# Patient Record
Sex: Female | Born: 1975 | Race: Black or African American | Hispanic: No | Marital: Single
Health system: Midwestern US, Community
[De-identification: ages and names within clinical notes are randomized; demographics above are authoritative.]

## PROBLEM LIST (undated history)

## (undated) ENCOUNTER — Ambulatory Visit

## (undated) ENCOUNTER — Inpatient Hospital Stay (HOSPITAL_COMMUNITY): Payer: Self-pay

## (undated) DIAGNOSIS — A059 Bacterial foodborne intoxication, unspecified: Secondary | ICD-10-CM

## (undated) DIAGNOSIS — K219 Gastro-esophageal reflux disease without esophagitis: Secondary | ICD-10-CM

## (undated) DIAGNOSIS — G43909 Migraine, unspecified, not intractable, without status migrainosus: Secondary | ICD-10-CM

## (undated) DIAGNOSIS — T7840XA Allergy, unspecified, initial encounter: Secondary | ICD-10-CM

## (undated) DIAGNOSIS — F319 Bipolar disorder, unspecified: Secondary | ICD-10-CM

## (undated) DIAGNOSIS — IMO0001 Reserved for inherently not codable concepts without codable children: Secondary | ICD-10-CM

## (undated) DIAGNOSIS — F32A Depression, unspecified: Secondary | ICD-10-CM

## (undated) DIAGNOSIS — B009 Herpesviral infection, unspecified: Secondary | ICD-10-CM

## (undated) DIAGNOSIS — K759 Inflammatory liver disease, unspecified: Secondary | ICD-10-CM

## (undated) DIAGNOSIS — M1990 Unspecified osteoarthritis, unspecified site: Secondary | ICD-10-CM

## (undated) DIAGNOSIS — C801 Malignant (primary) neoplasm, unspecified: Secondary | ICD-10-CM

## (undated) DIAGNOSIS — I10 Essential (primary) hypertension: Secondary | ICD-10-CM

## (undated) DIAGNOSIS — E119 Type 2 diabetes mellitus without complications: Secondary | ICD-10-CM

## (undated) DIAGNOSIS — F329 Major depressive disorder, single episode, unspecified: Secondary | ICD-10-CM

## (undated) DIAGNOSIS — C539 Malignant neoplasm of cervix uteri, unspecified: Secondary | ICD-10-CM

## (undated) DIAGNOSIS — M797 Fibromyalgia: Secondary | ICD-10-CM

## (undated) DIAGNOSIS — M5416 Radiculopathy, lumbar region: Secondary | ICD-10-CM

## (undated) DIAGNOSIS — G4733 Obstructive sleep apnea (adult) (pediatric): Secondary | ICD-10-CM

## (undated) DIAGNOSIS — E669 Obesity, unspecified: Secondary | ICD-10-CM

## (undated) DIAGNOSIS — F419 Anxiety disorder, unspecified: Secondary | ICD-10-CM

## (undated) DIAGNOSIS — G629 Polyneuropathy, unspecified: Secondary | ICD-10-CM

## (undated) DIAGNOSIS — K760 Fatty (change of) liver, not elsewhere classified: Secondary | ICD-10-CM

## (undated) DIAGNOSIS — J45909 Unspecified asthma, uncomplicated: Secondary | ICD-10-CM

## (undated) DIAGNOSIS — D649 Anemia, unspecified: Secondary | ICD-10-CM

## (undated) DIAGNOSIS — M4800 Spinal stenosis, site unspecified: Secondary | ICD-10-CM

## (undated) DIAGNOSIS — Z9989 Dependence on other enabling machines and devices: Secondary | ICD-10-CM

## (undated) HISTORY — PX: FACET JOINT INJECTION: SHX5016

## (undated) HISTORY — PX: COLPOSCOPY: SHX161

## (undated) HISTORY — PX: CARPAL TUNNEL RELEASE: SHX101

## (undated) HISTORY — DX: Anemia, unspecified: D64.9

## (undated) HISTORY — PX: WISDOM TOOTH EXTRACTION: SHX21

## (undated) MED FILL — NITROFURANTOIN MONOHYD MAC 100 CAPS: 100 MG | 7 days supply | Qty: 14 | Fill #0 | Status: AC

---

## 1997-11-29 ENCOUNTER — Emergency Department (HOSPITAL_COMMUNITY): Admission: EM | Admit: 1997-11-29 | Discharge: 1997-11-29 | Payer: Self-pay | Admitting: Emergency Medicine

## 1998-02-28 ENCOUNTER — Ambulatory Visit (HOSPITAL_COMMUNITY): Admission: RE | Admit: 1998-02-28 | Discharge: 1998-02-28 | Payer: Self-pay | Admitting: Orthopedic Surgery

## 1998-07-14 ENCOUNTER — Emergency Department (HOSPITAL_COMMUNITY): Admission: EM | Admit: 1998-07-14 | Discharge: 1998-07-14 | Payer: Self-pay | Admitting: Emergency Medicine

## 1998-07-14 ENCOUNTER — Encounter: Payer: Self-pay | Admitting: Emergency Medicine

## 1999-02-04 ENCOUNTER — Emergency Department (HOSPITAL_COMMUNITY): Admission: EM | Admit: 1999-02-04 | Discharge: 1999-02-04 | Payer: Self-pay | Admitting: Emergency Medicine

## 1999-05-28 ENCOUNTER — Emergency Department (HOSPITAL_COMMUNITY): Admission: EM | Admit: 1999-05-28 | Discharge: 1999-05-28 | Payer: Self-pay | Admitting: Emergency Medicine

## 2000-07-29 ENCOUNTER — Inpatient Hospital Stay (HOSPITAL_COMMUNITY): Admission: AD | Admit: 2000-07-29 | Discharge: 2000-07-29 | Payer: Self-pay | Admitting: Obstetrics & Gynecology

## 2000-11-28 ENCOUNTER — Encounter: Payer: Self-pay | Admitting: Obstetrics and Gynecology

## 2000-11-28 ENCOUNTER — Ambulatory Visit (HOSPITAL_COMMUNITY): Admission: RE | Admit: 2000-11-28 | Discharge: 2000-11-28 | Payer: Self-pay | Admitting: Obstetrics and Gynecology

## 2001-08-13 ENCOUNTER — Other Ambulatory Visit: Admission: RE | Admit: 2001-08-13 | Discharge: 2001-08-13 | Payer: Self-pay | Admitting: Obstetrics and Gynecology

## 2002-05-13 ENCOUNTER — Encounter: Payer: Self-pay | Admitting: *Deleted

## 2002-05-13 ENCOUNTER — Emergency Department (HOSPITAL_COMMUNITY): Admission: EM | Admit: 2002-05-13 | Discharge: 2002-05-13 | Payer: Self-pay | Admitting: *Deleted

## 2002-05-20 ENCOUNTER — Encounter: Admission: RE | Admit: 2002-05-20 | Discharge: 2002-06-09 | Payer: Self-pay | Admitting: Orthopedic Surgery

## 2003-07-03 ENCOUNTER — Ambulatory Visit (HOSPITAL_COMMUNITY): Admission: RE | Admit: 2003-07-03 | Discharge: 2003-07-03 | Payer: Self-pay | Admitting: Internal Medicine

## 2003-07-13 ENCOUNTER — Other Ambulatory Visit: Admission: RE | Admit: 2003-07-13 | Discharge: 2003-07-13 | Payer: Self-pay | Admitting: Obstetrics and Gynecology

## 2004-03-15 ENCOUNTER — Emergency Department (HOSPITAL_COMMUNITY): Admission: EM | Admit: 2004-03-15 | Discharge: 2004-03-15 | Payer: Self-pay | Admitting: Emergency Medicine

## 2004-10-03 ENCOUNTER — Other Ambulatory Visit: Admission: RE | Admit: 2004-10-03 | Discharge: 2004-10-03 | Payer: Self-pay | Admitting: Obstetrics and Gynecology

## 2005-06-22 ENCOUNTER — Inpatient Hospital Stay (HOSPITAL_COMMUNITY): Admission: AD | Admit: 2005-06-22 | Discharge: 2005-06-22 | Payer: Self-pay | Admitting: Obstetrics and Gynecology

## 2005-06-23 ENCOUNTER — Inpatient Hospital Stay (HOSPITAL_COMMUNITY): Admission: AD | Admit: 2005-06-23 | Discharge: 2005-06-23 | Payer: Self-pay | Admitting: Obstetrics and Gynecology

## 2005-07-09 ENCOUNTER — Inpatient Hospital Stay (HOSPITAL_COMMUNITY): Admission: AD | Admit: 2005-07-09 | Discharge: 2005-07-09 | Payer: Self-pay | Admitting: Obstetrics and Gynecology

## 2005-09-03 ENCOUNTER — Emergency Department (HOSPITAL_COMMUNITY): Admission: EM | Admit: 2005-09-03 | Discharge: 2005-09-03 | Payer: Self-pay | Admitting: Emergency Medicine

## 2005-09-15 ENCOUNTER — Emergency Department (HOSPITAL_COMMUNITY): Admission: EM | Admit: 2005-09-15 | Discharge: 2005-09-15 | Payer: Self-pay | Admitting: Emergency Medicine

## 2005-10-04 ENCOUNTER — Other Ambulatory Visit: Admission: RE | Admit: 2005-10-04 | Discharge: 2005-10-04 | Payer: Self-pay | Admitting: Obstetrics and Gynecology

## 2005-10-29 ENCOUNTER — Inpatient Hospital Stay (HOSPITAL_COMMUNITY): Admission: AD | Admit: 2005-10-29 | Discharge: 2005-10-29 | Payer: Self-pay | Admitting: Obstetrics and Gynecology

## 2006-01-21 ENCOUNTER — Inpatient Hospital Stay (HOSPITAL_COMMUNITY): Admission: AD | Admit: 2006-01-21 | Discharge: 2006-01-21 | Payer: Self-pay | Admitting: Obstetrics and Gynecology

## 2006-04-16 HISTORY — PX: MANDIBLE SURGERY: SHX707

## 2006-08-08 ENCOUNTER — Ambulatory Visit (HOSPITAL_COMMUNITY): Admission: RE | Admit: 2006-08-08 | Discharge: 2006-08-10 | Payer: Self-pay | Admitting: Neurosurgery

## 2006-12-08 ENCOUNTER — Emergency Department (HOSPITAL_COMMUNITY): Admission: EM | Admit: 2006-12-08 | Discharge: 2006-12-08 | Payer: Self-pay | Admitting: *Deleted

## 2007-04-19 ENCOUNTER — Emergency Department (HOSPITAL_COMMUNITY): Admission: EM | Admit: 2007-04-19 | Discharge: 2007-04-19 | Payer: Self-pay | Admitting: Emergency Medicine

## 2008-01-12 ENCOUNTER — Ambulatory Visit: Payer: Self-pay | Admitting: Hematology and Oncology

## 2008-01-22 LAB — URINALYSIS, MICROSCOPIC - CHCC
Bilirubin (Urine): NEGATIVE
Glucose: NEGATIVE g/dL
Ketones: NEGATIVE mg/dL
RBC count: NEGATIVE (ref 0–2)

## 2008-01-22 LAB — CBC & DIFF AND RETIC
BASO%: 0.9 % (ref 0.0–2.0)
Basophils Absolute: 0.1 10e3/uL (ref 0.0–0.1)
EOS%: 2.4 % (ref 0.0–7.0)
Eosinophils Absolute: 0.2 10e3/uL (ref 0.0–0.5)
HCT: 34.4 % — ABNORMAL LOW (ref 34.8–46.6)
HGB: 10.6 g/dL — ABNORMAL LOW (ref 11.6–15.9)
IRF: 0.31 (ref 0.130–0.330)
LYMPH%: 24.3 % (ref 14.0–48.0)
MCH: 19.6 pg — ABNORMAL LOW (ref 26.0–34.0)
MCHC: 30.9 g/dL — ABNORMAL LOW (ref 32.0–36.0)
MCV: 63.6 fL — ABNORMAL LOW (ref 81.0–101.0)
MONO#: 0.5 10e3/uL (ref 0.1–0.9)
MONO%: 5.7 % (ref 0.0–13.0)
NEUT#: 6 10e3/uL (ref 1.5–6.5)
NEUT%: 66.8 % (ref 39.6–76.8)
Platelets: 438 10e3/uL — ABNORMAL HIGH (ref 145–400)
RBC: 5.41 10e6/uL — ABNORMAL HIGH (ref 3.70–5.32)
RDW: 16.8 % — ABNORMAL HIGH (ref 11.3–14.5)
RETIC #: 77.9 10e3/uL (ref 19.7–115.1)
Retic %: 1.4 % (ref 0.4–2.3)
WBC: 9 10e3/uL (ref 3.9–10.0)
lymph#: 2.2 10e3/uL (ref 0.9–3.3)

## 2008-01-23 LAB — RHEUMATOID FACTOR: Rhuematoid fact SerPl-aCnc: <20 IU/mL (ref 0–20)

## 2008-01-23 LAB — ANA: Anti Nuclear Antibody(ANA): NEGATIVE

## 2008-01-26 LAB — PROTEIN ELECTROPHORESIS, SERUM, WITH REFLEX
Alpha-2-Globulin: 11.4 % (ref 7.1–11.8)
Total Protein, Serum Electrophoresis: 7.5 g/dL (ref 6.0–8.3)

## 2008-01-26 LAB — COMPREHENSIVE METABOLIC PANEL
ALT: 31 U/L (ref 0–35)
CO2: 24 mEq/L (ref 19–32)
Creatinine, Ser: 0.68 mg/dL (ref 0.40–1.20)
Glucose, Bld: 91 mg/dL (ref 70–99)
Total Bilirubin: 0.2 mg/dL — ABNORMAL LOW (ref 0.3–1.2)

## 2008-01-26 LAB — HEMOGLOBINOPATHY EVALUATION
Hgb F Quant: 0 % (ref 0.0–2.0)
Hgb S Quant: 0 % (ref 0.0–0.0)

## 2008-01-26 LAB — IRON AND TIBC
%SAT: 3 % — ABNORMAL LOW (ref 20–55)
Iron: 12 ug/dL — ABNORMAL LOW (ref 42–145)
TIBC: 368 ug/dL (ref 250–470)

## 2008-01-26 LAB — LACTATE DEHYDROGENASE: LDH: 150 U/L (ref 94–250)

## 2008-01-26 LAB — TSH: TSH: 1.308 u[IU]/mL (ref 0.350–4.500)

## 2008-01-30 LAB — FECAL OCCULT BLOOD, GUAIAC: Occult Blood: NEGATIVE

## 2008-02-13 LAB — CBC WITH DIFFERENTIAL/PLATELET
BASO%: 0.5 % (ref 0.0–2.0)
Eosinophils Absolute: 0.2 10*3/uL (ref 0.0–0.5)
LYMPH%: 22.1 % (ref 14.0–48.0)
MONO#: 0.5 10*3/uL (ref 0.1–0.9)
NEUT#: 6.4 10*3/uL (ref 1.5–6.5)
Platelets: 390 10*3/uL (ref 145–400)
RBC: 5.27 10*6/uL (ref 3.70–5.32)
RDW: 18.7 % — ABNORMAL HIGH (ref 11.3–14.5)
WBC: 9.2 10*3/uL (ref 3.9–10.0)
lymph#: 2 10*3/uL (ref 0.9–3.3)

## 2008-02-28 ENCOUNTER — Emergency Department (HOSPITAL_COMMUNITY): Admission: EM | Admit: 2008-02-28 | Discharge: 2008-02-28 | Payer: Self-pay | Admitting: Emergency Medicine

## 2008-03-10 ENCOUNTER — Ambulatory Visit: Payer: Self-pay | Admitting: Hematology and Oncology

## 2008-03-16 LAB — CBC WITH DIFFERENTIAL/PLATELET
Eosinophils Absolute: 0.2 10*3/uL (ref 0.0–0.5)
HCT: 37.5 % (ref 34.8–46.6)
LYMPH%: 15 % (ref 14.0–48.0)
MCV: 70 fL — ABNORMAL LOW (ref 81.0–101.0)
MONO#: 0.7 10*3/uL (ref 0.1–0.9)
NEUT#: 8.6 10*3/uL — ABNORMAL HIGH (ref 1.5–6.5)
NEUT%: 76.2 % (ref 39.6–76.8)
Platelets: 341 10*3/uL (ref 145–400)
WBC: 11.3 10*3/uL — ABNORMAL HIGH (ref 3.9–10.0)

## 2008-03-16 LAB — FERRITIN: Ferritin: 295 ng/mL — ABNORMAL HIGH (ref 10–291)

## 2008-03-16 LAB — IRON AND TIBC: %SAT: 14 % — ABNORMAL LOW (ref 20–55)

## 2008-06-05 ENCOUNTER — Inpatient Hospital Stay (HOSPITAL_COMMUNITY): Admission: AD | Admit: 2008-06-05 | Discharge: 2008-06-05 | Payer: Self-pay | Admitting: Obstetrics & Gynecology

## 2008-06-18 ENCOUNTER — Ambulatory Visit: Payer: Self-pay | Admitting: Hematology and Oncology

## 2008-06-22 ENCOUNTER — Encounter: Admission: RE | Admit: 2008-06-22 | Discharge: 2008-06-22 | Payer: Self-pay | Admitting: Family Medicine

## 2008-06-22 LAB — COMPREHENSIVE METABOLIC PANEL
AST: 18 U/L (ref 0–37)
Albumin: 4.4 g/dL (ref 3.5–5.2)
Alkaline Phosphatase: 76 U/L (ref 39–117)
Calcium: 9.3 mg/dL (ref 8.4–10.5)
Chloride: 104 mEq/L (ref 96–112)
Glucose, Bld: 103 mg/dL — ABNORMAL HIGH (ref 70–99)
Potassium: 3.7 mEq/L (ref 3.5–5.3)
Sodium: 140 mEq/L (ref 135–145)
Total Protein: 7.4 g/dL (ref 6.0–8.3)

## 2008-06-22 LAB — CBC WITH DIFFERENTIAL/PLATELET
Basophils Absolute: 0 10*3/uL (ref 0.0–0.1)
Eosinophils Absolute: 0.2 10*3/uL (ref 0.0–0.5)
HGB: 12.4 g/dL (ref 11.6–15.9)
MCV: 76.9 fL — ABNORMAL LOW (ref 79.5–101.0)
MONO#: 0.4 10*3/uL (ref 0.1–0.9)
MONO%: 3.8 % (ref 0.0–14.0)
NEUT#: 7.6 10*3/uL — ABNORMAL HIGH (ref 1.5–6.5)
RBC: 4.97 10*6/uL (ref 3.70–5.45)
RDW: 15.8 % — ABNORMAL HIGH (ref 11.2–14.5)
WBC: 9.7 10*3/uL (ref 3.9–10.3)
lymph#: 1.6 10*3/uL (ref 0.9–3.3)

## 2008-06-22 LAB — IRON AND TIBC: UIBC: 318 ug/dL

## 2008-08-11 ENCOUNTER — Emergency Department (HOSPITAL_COMMUNITY): Admission: EM | Admit: 2008-08-11 | Discharge: 2008-08-11 | Payer: Self-pay | Admitting: Emergency Medicine

## 2008-09-28 ENCOUNTER — Ambulatory Visit: Payer: Self-pay | Admitting: Hematology and Oncology

## 2008-09-30 LAB — IRON AND TIBC
%SAT: 6 % — ABNORMAL LOW (ref 20–55)
TIBC: 331 ug/dL (ref 250–470)

## 2008-09-30 LAB — COMPREHENSIVE METABOLIC PANEL
ALT: 28 U/L (ref 0–35)
AST: 19 U/L (ref 0–37)
Alkaline Phosphatase: 79 U/L (ref 39–117)
BUN: 10 mg/dL (ref 6–23)
Chloride: 105 mEq/L (ref 96–112)
Creatinine, Ser: 0.87 mg/dL (ref 0.40–1.20)
Total Bilirubin: 0.2 mg/dL — ABNORMAL LOW (ref 0.3–1.2)

## 2008-09-30 LAB — CBC WITH DIFFERENTIAL/PLATELET
BASO%: 0.1 % (ref 0.0–2.0)
Basophils Absolute: 0 10*3/uL (ref 0.0–0.1)
EOS%: 1.8 % (ref 0.0–7.0)
HCT: 35.3 % (ref 34.8–46.6)
HGB: 12.1 g/dL (ref 11.6–15.9)
MCH: 25.5 pg (ref 25.1–34.0)
MCHC: 34.3 g/dL (ref 31.5–36.0)
MCV: 74.3 fL — ABNORMAL LOW (ref 79.5–101.0)
MONO%: 4.3 % (ref 0.0–14.0)
NEUT%: 71.4 % (ref 38.4–76.8)
lymph#: 2.3 10*3/uL (ref 0.9–3.3)

## 2008-09-30 LAB — FERRITIN: Ferritin: 145 ng/mL (ref 10–291)

## 2008-11-14 ENCOUNTER — Emergency Department (HOSPITAL_COMMUNITY): Admission: EM | Admit: 2008-11-14 | Discharge: 2008-11-15 | Payer: Self-pay | Admitting: Emergency Medicine

## 2009-03-18 ENCOUNTER — Emergency Department (HOSPITAL_COMMUNITY): Admission: EM | Admit: 2009-03-18 | Discharge: 2009-03-18 | Payer: Self-pay | Admitting: Emergency Medicine

## 2009-04-14 ENCOUNTER — Inpatient Hospital Stay (HOSPITAL_COMMUNITY): Admission: AD | Admit: 2009-04-14 | Discharge: 2009-04-14 | Payer: Self-pay | Admitting: Obstetrics and Gynecology

## 2009-05-18 ENCOUNTER — Ambulatory Visit: Payer: Self-pay | Admitting: Hematology and Oncology

## 2009-05-18 LAB — CBC WITH DIFFERENTIAL/PLATELET
BASO%: 0.3 % (ref 0.0–2.0)
Basophils Absolute: 0 10*3/uL (ref 0.0–0.1)
EOS%: 1.2 % (ref 0.0–7.0)
Eosinophils Absolute: 0.1 10*3/uL (ref 0.0–0.5)
HCT: 34.8 % (ref 34.8–46.6)
HGB: 10.6 g/dL — ABNORMAL LOW (ref 11.6–15.9)
LYMPH%: 22.5 % (ref 14.0–49.7)
MCH: 20.6 pg — ABNORMAL LOW (ref 25.1–34.0)
MCHC: 30.5 g/dL — ABNORMAL LOW (ref 31.5–36.0)
MCV: 67.6 fL — ABNORMAL LOW (ref 79.5–101.0)
MONO#: 0.6 10*3/uL (ref 0.1–0.9)
MONO%: 5.1 % (ref 0.0–14.0)
NEUT#: 7.9 10*3/uL — ABNORMAL HIGH (ref 1.5–6.5)
NEUT%: 70.9 % (ref 38.4–76.8)
Platelets: 521 10*3/uL — ABNORMAL HIGH (ref 145–400)
RBC: 5.15 10*6/uL (ref 3.70–5.45)
RDW: 16.4 % — ABNORMAL HIGH (ref 11.2–14.5)
WBC: 11.2 10*3/uL — ABNORMAL HIGH (ref 3.9–10.3)
lymph#: 2.5 10*3/uL (ref 0.9–3.3)

## 2009-05-20 LAB — COMPREHENSIVE METABOLIC PANEL
ALT: 23 U/L (ref 0–35)
AST: 22 U/L (ref 0–37)
Albumin: 4.6 g/dL (ref 3.5–5.2)
Alkaline Phosphatase: 82 U/L (ref 39–117)
BUN: 12 mg/dL (ref 6–23)
CO2: 23 mEq/L (ref 19–32)
Calcium: 9.9 mg/dL (ref 8.4–10.5)
Chloride: 101 mEq/L (ref 96–112)
Creatinine, Ser: 0.72 mg/dL (ref 0.40–1.20)
Glucose, Bld: 92 mg/dL (ref 70–99)
Potassium: 3.8 mEq/L (ref 3.5–5.3)
Sodium: 138 mEq/L (ref 135–145)
Total Bilirubin: 0.3 mg/dL (ref 0.3–1.2)
Total Protein: 7.9 g/dL (ref 6.0–8.3)

## 2009-05-20 LAB — FOLATE RBC: RBC Folate: 573 ng/mL (ref 180–600)

## 2009-05-20 LAB — PROTEIN ELECTROPHORESIS, SERUM
Albumin ELP: 54.8 % — ABNORMAL LOW (ref 55.8–66.1)
Alpha-1-Globulin: 4.1 % (ref 2.9–4.9)
Alpha-2-Globulin: 11.6 % (ref 7.1–11.8)
Beta 2: 5.5 % (ref 3.2–6.5)
Beta Globulin: 6 % (ref 4.7–7.2)
Gamma Globulin: 18 % (ref 11.1–18.8)
Total Protein, Serum Electrophoresis: 7.9 g/dL (ref 6.0–8.3)

## 2009-05-20 LAB — IRON AND TIBC
%SAT: 3 % — ABNORMAL LOW (ref 20–55)
Iron: 11 ug/dL — ABNORMAL LOW (ref 42–145)
TIBC: 389 ug/dL (ref 250–470)
UIBC: 378 ug/dL

## 2009-05-20 LAB — FERRITIN: Ferritin: 55 ng/mL (ref 10–291)

## 2009-07-25 ENCOUNTER — Ambulatory Visit: Payer: Self-pay | Admitting: Hematology and Oncology

## 2009-07-25 LAB — CBC WITH DIFFERENTIAL/PLATELET
BASO%: 0.3 % (ref 0.0–2.0)
Basophils Absolute: 0 10*3/uL (ref 0.0–0.1)
EOS%: 1.6 % (ref 0.0–7.0)
Eosinophils Absolute: 0.2 10*3/uL (ref 0.0–0.5)
HCT: 37.9 % (ref 34.8–46.6)
HGB: 11.9 g/dL (ref 11.6–15.9)
LYMPH%: 21.6 % (ref 14.0–49.7)
MCH: 24 pg — ABNORMAL LOW (ref 25.1–34.0)
MCHC: 31.4 g/dL — ABNORMAL LOW (ref 31.5–36.0)
MCV: 76.6 fL — ABNORMAL LOW (ref 79.5–101.0)
MONO#: 0.5 10*3/uL (ref 0.1–0.9)
MONO%: 4.6 % (ref 0.0–14.0)
NEUT#: 8.2 10*3/uL — ABNORMAL HIGH (ref 1.5–6.5)
NEUT%: 71.9 % (ref 38.4–76.8)
Platelets: 360 10*3/uL (ref 145–400)
RBC: 4.95 10*6/uL (ref 3.70–5.45)
RDW: 22.8 % — ABNORMAL HIGH (ref 11.2–14.5)
WBC: 11.4 10*3/uL — ABNORMAL HIGH (ref 3.9–10.3)
lymph#: 2.5 10*3/uL (ref 0.9–3.3)
nRBC: 0 % (ref 0–0)

## 2009-07-25 LAB — BASIC METABOLIC PANEL
BUN: 7 mg/dL (ref 6–23)
CO2: 23 mEq/L (ref 19–32)
Calcium: 9.6 mg/dL (ref 8.4–10.5)
Chloride: 104 mEq/L (ref 96–112)
Creatinine, Ser: 0.77 mg/dL (ref 0.40–1.20)
Glucose, Bld: 86 mg/dL (ref 70–99)
Potassium: 4.2 mEq/L (ref 3.5–5.3)
Sodium: 140 mEq/L (ref 135–145)

## 2009-07-25 LAB — IRON AND TIBC
%SAT: 7 % — ABNORMAL LOW (ref 20–55)
Iron: 23 ug/dL — ABNORMAL LOW (ref 42–145)
TIBC: 309 ug/dL (ref 250–470)
UIBC: 286 ug/dL

## 2009-07-25 LAB — FERRITIN: Ferritin: 281 ng/mL (ref 10–291)

## 2009-07-26 DIAGNOSIS — M797 Fibromyalgia: Secondary | ICD-10-CM | POA: Insufficient documentation

## 2009-07-26 DIAGNOSIS — G9332 Myalgic encephalomyelitis/chronic fatigue syndrome: Secondary | ICD-10-CM | POA: Insufficient documentation

## 2009-07-28 ENCOUNTER — Ambulatory Visit (HOSPITAL_COMMUNITY): Admission: RE | Admit: 2009-07-28 | Discharge: 2009-07-28 | Payer: Self-pay | Admitting: Neurological Surgery

## 2009-09-15 ENCOUNTER — Emergency Department (HOSPITAL_COMMUNITY): Admission: EM | Admit: 2009-09-15 | Discharge: 2009-09-15 | Payer: Self-pay | Admitting: Emergency Medicine

## 2009-09-19 ENCOUNTER — Ambulatory Visit (HOSPITAL_BASED_OUTPATIENT_CLINIC_OR_DEPARTMENT_OTHER): Admission: RE | Admit: 2009-09-19 | Discharge: 2009-09-19 | Payer: Self-pay | Admitting: Family Medicine

## 2009-09-24 ENCOUNTER — Ambulatory Visit: Payer: Self-pay | Admitting: Internal Medicine

## 2009-11-22 ENCOUNTER — Ambulatory Visit: Payer: Self-pay | Admitting: Hematology and Oncology

## 2009-11-22 LAB — CBC WITH DIFFERENTIAL/PLATELET
BASO%: 0.4 % (ref 0.0–2.0)
Basophils Absolute: 0.1 10*3/uL (ref 0.0–0.1)
EOS%: 0.8 % (ref 0.0–7.0)
Eosinophils Absolute: 0.1 10*3/uL (ref 0.0–0.5)
HCT: 36.4 % (ref 34.8–46.6)
HGB: 11.9 g/dL (ref 11.6–15.9)
LYMPH%: 12.7 % — ABNORMAL LOW (ref 14.0–49.7)
MCH: 25.8 pg (ref 25.1–34.0)
MCHC: 32.6 g/dL (ref 31.5–36.0)
MCV: 79.1 fL — ABNORMAL LOW (ref 79.5–101.0)
MONO#: 0.5 10*3/uL (ref 0.1–0.9)
MONO%: 3.3 % (ref 0.0–14.0)
NEUT#: 12.5 10*3/uL — ABNORMAL HIGH (ref 1.5–6.5)
NEUT%: 82.8 % — ABNORMAL HIGH (ref 38.4–76.8)
Platelets: 429 10*3/uL — ABNORMAL HIGH (ref 145–400)
RBC: 4.61 10*6/uL (ref 3.70–5.45)
RDW: 16.8 % — ABNORMAL HIGH (ref 11.2–14.5)
WBC: 15.1 10*3/uL — ABNORMAL HIGH (ref 3.9–10.3)
lymph#: 1.9 10*3/uL (ref 0.9–3.3)

## 2009-11-22 LAB — IRON AND TIBC
%SAT: 4 % — ABNORMAL LOW (ref 20–55)
Iron: 16 ug/dL — ABNORMAL LOW (ref 42–145)
TIBC: 364 ug/dL (ref 250–470)
UIBC: 348 ug/dL

## 2009-11-22 LAB — BASIC METABOLIC PANEL
BUN: 8 mg/dL (ref 6–23)
CO2: 22 mEq/L (ref 19–32)
Calcium: 9.6 mg/dL (ref 8.4–10.5)
Chloride: 102 mEq/L (ref 96–112)
Creatinine, Ser: 0.71 mg/dL (ref 0.40–1.20)
Glucose, Bld: 104 mg/dL — ABNORMAL HIGH (ref 70–99)
Potassium: 4.2 mEq/L (ref 3.5–5.3)
Sodium: 139 mEq/L (ref 135–145)

## 2009-11-22 LAB — FERRITIN: Ferritin: 163 ng/mL (ref 10–291)

## 2009-12-01 ENCOUNTER — Encounter: Admission: RE | Admit: 2009-12-01 | Discharge: 2009-12-01 | Payer: Self-pay | Admitting: Family Medicine

## 2010-02-02 ENCOUNTER — Emergency Department (HOSPITAL_COMMUNITY): Admission: EM | Admit: 2010-02-02 | Discharge: 2010-02-02 | Payer: Self-pay | Admitting: Emergency Medicine

## 2010-04-03 ENCOUNTER — Encounter
Admission: RE | Admit: 2010-04-03 | Discharge: 2010-04-12 | Payer: Self-pay | Source: Home / Self Care | Attending: Neurology | Admitting: Neurology

## 2010-04-12 ENCOUNTER — Ambulatory Visit: Payer: Self-pay | Admitting: Hematology and Oncology

## 2010-04-12 ENCOUNTER — Encounter
Admission: RE | Admit: 2010-04-12 | Discharge: 2010-04-21 | Payer: Self-pay | Source: Home / Self Care | Attending: Neurology | Admitting: Neurology

## 2010-04-16 ENCOUNTER — Inpatient Hospital Stay (HOSPITAL_COMMUNITY)
Admission: AD | Admit: 2010-04-16 | Discharge: 2010-04-16 | Disposition: A | Discharge status: Still In Hospital | Payer: Self-pay | Source: Home / Self Care | Attending: Obstetrics and Gynecology | Admitting: Obstetrics and Gynecology

## 2010-04-16 ENCOUNTER — Inpatient Hospital Stay (HOSPITAL_COMMUNITY)
Admission: AD | Admit: 2010-04-16 | Discharge: 2010-04-16 | Payer: Self-pay | Source: Home / Self Care | Attending: Obstetrics and Gynecology | Admitting: Obstetrics and Gynecology

## 2010-04-19 ENCOUNTER — Encounter: Admit: 2010-04-19 | Payer: Self-pay | Admitting: Neurology

## 2010-04-20 LAB — COMPREHENSIVE METABOLIC PANEL
ALT: 21 U/L (ref 0–35)
AST: 26 U/L (ref 0–37)
Albumin: 4.6 g/dL (ref 3.5–5.2)
Alkaline Phosphatase: 76 U/L (ref 39–117)
BUN: 9 mg/dL (ref 6–23)
CO2: 24 mEq/L (ref 19–32)
Calcium: 9 mg/dL (ref 8.4–10.5)
Chloride: 101 mEq/L (ref 96–112)
Creatinine, Ser: 0.7 mg/dL (ref 0.40–1.20)
Glucose, Bld: 124 mg/dL — ABNORMAL HIGH (ref 70–99)
Potassium: 3.7 mEq/L (ref 3.5–5.3)
Sodium: 137 mEq/L (ref 135–145)
Total Bilirubin: 0.3 mg/dL (ref 0.3–1.2)
Total Protein: 7.7 g/dL (ref 6.0–8.3)

## 2010-04-20 LAB — CBC WITH DIFFERENTIAL/PLATELET
BASO%: 0.3 % (ref 0.0–2.0)
Basophils Absolute: 0 10*3/uL (ref 0.0–0.1)
EOS%: 2.1 % (ref 0.0–7.0)
Eosinophils Absolute: 0.2 10*3/uL (ref 0.0–0.5)
HCT: 35.3 % (ref 34.8–46.6)
HGB: 11.6 g/dL (ref 11.6–15.9)
LYMPH%: 17.8 % (ref 14.0–49.7)
MCH: 24.1 pg — ABNORMAL LOW (ref 25.1–34.0)
MCHC: 32.9 g/dL (ref 31.5–36.0)
MCV: 73.4 fL — ABNORMAL LOW (ref 79.5–101.0)
MONO#: 0.4 10*3/uL (ref 0.1–0.9)
MONO%: 4.1 % (ref 0.0–14.0)
NEUT#: 7.7 10*3/uL — ABNORMAL HIGH (ref 1.5–6.5)
NEUT%: 75.7 % (ref 38.4–76.8)
Platelets: 406 10*3/uL — ABNORMAL HIGH (ref 145–400)
RBC: 4.81 10*6/uL (ref 3.70–5.45)
RDW: 17.6 % — ABNORMAL HIGH (ref 11.2–14.5)
WBC: 10.1 10*3/uL (ref 3.9–10.3)
lymph#: 1.8 10*3/uL (ref 0.9–3.3)

## 2010-04-20 LAB — IRON AND TIBC
%SAT: 5 % — ABNORMAL LOW (ref 20–55)
Iron: 15 ug/dL — ABNORMAL LOW (ref 42–145)
TIBC: 332 ug/dL (ref 250–470)
UIBC: 317 ug/dL

## 2010-04-20 LAB — FERRITIN: Ferritin: 183 ng/mL (ref 10–291)

## 2010-04-21 DIAGNOSIS — Z87898 Personal history of other specified conditions: Secondary | ICD-10-CM | POA: Insufficient documentation

## 2010-05-07 ENCOUNTER — Encounter: Payer: Self-pay | Admitting: Family Medicine

## 2010-05-27 ENCOUNTER — Emergency Department (HOSPITAL_COMMUNITY)
Admission: EM | Admit: 2010-05-27 | Discharge: 2010-05-27 | Disposition: A | Discharge status: Home / Self Care | Payer: Medicaid Other | Attending: Emergency Medicine | Admitting: Emergency Medicine

## 2010-05-27 DIAGNOSIS — IMO0001 Reserved for inherently not codable concepts without codable children: Secondary | ICD-10-CM | POA: Insufficient documentation

## 2010-05-27 DIAGNOSIS — J45909 Unspecified asthma, uncomplicated: Secondary | ICD-10-CM | POA: Insufficient documentation

## 2010-05-27 DIAGNOSIS — R300 Dysuria: Secondary | ICD-10-CM | POA: Insufficient documentation

## 2010-05-27 LAB — BASIC METABOLIC PANEL
BUN: 9 mg/dL (ref 6–23)
CO2: 26 mEq/L (ref 19–32)
Calcium: 9.7 mg/dL (ref 8.4–10.5)
Chloride: 105 mEq/L (ref 96–112)
Creatinine, Ser: 0.66 mg/dL (ref 0.4–1.2)
GFR calc Af Amer: >60 mL/min (ref >60)
GFR calc non Af Amer: >60 mL/min (ref >60)
Glucose, Bld: 115 mg/dL — ABNORMAL HIGH (ref 70–99)
Potassium: 3.5 mEq/L (ref 3.5–5.1)
Sodium: 140 mEq/L (ref 135–145)

## 2010-05-27 LAB — URINALYSIS, ROUTINE W REFLEX MICROSCOPIC
Bilirubin Urine: NEGATIVE
Hgb urine dipstick: NEGATIVE
Ketones, ur: NEGATIVE mg/dL
Nitrite: NEGATIVE
Protein, ur: NEGATIVE mg/dL
Specific Gravity, Urine: 1.018 (ref 1.005–1.030)
Urine Glucose, Fasting: NEGATIVE mg/dL
Urobilinogen, UA: 0.2 mg/dL (ref 0.0–1.0)
pH: 6.5 (ref 5.0–8.0)

## 2010-05-27 LAB — DIFFERENTIAL
Basophils Absolute: 0 10*3/uL (ref 0.0–0.1)
Basophils Relative: 0 % (ref 0–1)
Eosinophils Absolute: 0.3 10*3/uL (ref 0.0–0.7)
Eosinophils Relative: 3 % (ref 0–5)
Lymphocytes Relative: 24 % (ref 12–46)
Lymphs Abs: 2.5 10*3/uL (ref 0.7–4.0)
Monocytes Absolute: 0.6 10*3/uL (ref 0.1–1.0)
Monocytes Relative: 6 % (ref 3–12)
Neutro Abs: 7.1 10*3/uL (ref 1.7–7.7)
Neutrophils Relative %: 67 % (ref 43–77)

## 2010-05-27 LAB — CBC
HCT: 35.3 % — ABNORMAL LOW (ref 36.0–46.0)
Hemoglobin: 11.4 g/dL — ABNORMAL LOW (ref 12.0–15.0)
MCH: 23.6 pg — ABNORMAL LOW (ref 26.0–34.0)
MCHC: 32.3 g/dL (ref 30.0–36.0)
MCV: 73.1 fL — ABNORMAL LOW (ref 78.0–100.0)
Platelets: 369 10*3/uL (ref 150–400)
RBC: 4.83 MIL/uL (ref 3.87–5.11)
RDW: 16.5 % — ABNORMAL HIGH (ref 11.5–15.5)
WBC: 10.6 10*3/uL — ABNORMAL HIGH (ref 4.0–10.5)

## 2010-05-27 LAB — PREGNANCY, URINE: Preg Test, Ur: NEGATIVE

## 2010-05-29 LAB — URINE CULTURE
Colony Count: 6000
Culture  Setup Time: 201202120153

## 2010-06-06 ENCOUNTER — Emergency Department (HOSPITAL_BASED_OUTPATIENT_CLINIC_OR_DEPARTMENT_OTHER)
Admission: EM | Admit: 2010-06-06 | Discharge: 2010-06-06 | Disposition: A | Discharge status: Home / Self Care | Payer: Medicaid Other | Attending: Emergency Medicine | Admitting: Emergency Medicine

## 2010-06-06 DIAGNOSIS — Z79899 Other long term (current) drug therapy: Secondary | ICD-10-CM | POA: Insufficient documentation

## 2010-06-06 DIAGNOSIS — J329 Chronic sinusitis, unspecified: Secondary | ICD-10-CM | POA: Insufficient documentation

## 2010-06-06 DIAGNOSIS — IMO0001 Reserved for inherently not codable concepts without codable children: Secondary | ICD-10-CM | POA: Insufficient documentation

## 2010-06-06 DIAGNOSIS — J45909 Unspecified asthma, uncomplicated: Secondary | ICD-10-CM | POA: Insufficient documentation

## 2010-06-06 DIAGNOSIS — R000 Tachycardia, unspecified: Secondary | ICD-10-CM | POA: Insufficient documentation

## 2010-06-26 LAB — URINALYSIS, ROUTINE W REFLEX MICROSCOPIC
Ketones, ur: 15 mg/dL — AB
Ketones, ur: 15 mg/dL — AB
Nitrite: POSITIVE — AB
Nitrite: POSITIVE — AB
Protein, ur: >300 mg/dL — AB
Urobilinogen, UA: 0.2 mg/dL (ref 0.0–1.0)
Urobilinogen, UA: >8 mg/dL — ABNORMAL HIGH (ref 0.0–1.0)

## 2010-06-26 LAB — URINE CULTURE: Colony Count: >=100000

## 2010-06-26 LAB — GC/CHLAMYDIA PROBE AMP, GENITAL
Chlamydia, DNA Probe: NEGATIVE
GC Probe Amp, Genital: NEGATIVE

## 2010-06-26 LAB — BASIC METABOLIC PANEL
BUN: 6 mg/dL (ref 6–23)
CO2: 26 mEq/L (ref 19–32)
Chloride: 101 mEq/L (ref 96–112)
Potassium: 3.6 mEq/L (ref 3.5–5.1)

## 2010-06-26 LAB — URINE MICROSCOPIC-ADD ON

## 2010-07-05 ENCOUNTER — Emergency Department (HOSPITAL_COMMUNITY)
Admission: EM | Admit: 2010-07-05 | Discharge: 2010-07-05 | Disposition: A | Discharge status: Home / Self Care | Payer: Medicaid Other | Attending: Emergency Medicine | Admitting: Emergency Medicine

## 2010-07-05 DIAGNOSIS — H1045 Other chronic allergic conjunctivitis: Secondary | ICD-10-CM | POA: Insufficient documentation

## 2010-07-05 DIAGNOSIS — R21 Rash and other nonspecific skin eruption: Secondary | ICD-10-CM | POA: Insufficient documentation

## 2010-07-05 DIAGNOSIS — IMO0001 Reserved for inherently not codable concepts without codable children: Secondary | ICD-10-CM | POA: Insufficient documentation

## 2010-07-17 LAB — DIFFERENTIAL
Basophils Absolute: 0 10*3/uL (ref 0.0–0.1)
Basophils Relative: 0 % (ref 0–1)
Eosinophils Absolute: 0.1 10*3/uL (ref 0.0–0.7)
Eosinophils Relative: 1 % (ref 0–5)

## 2010-07-17 LAB — URINALYSIS, ROUTINE W REFLEX MICROSCOPIC
Bilirubin Urine: NEGATIVE
Hgb urine dipstick: NEGATIVE
Protein, ur: NEGATIVE mg/dL
Urobilinogen, UA: 0.2 mg/dL (ref 0.0–1.0)

## 2010-07-17 LAB — WET PREP, GENITAL: Trich, Wet Prep: NONE SEEN

## 2010-07-17 LAB — GC/CHLAMYDIA PROBE AMP, GENITAL: GC Probe Amp, Genital: NEGATIVE

## 2010-07-17 LAB — CBC
HCT: 32.5 % — ABNORMAL LOW (ref 36.0–46.0)
MCV: 68.8 fL — ABNORMAL LOW (ref 78.0–100.0)
Platelets: 440 10*3/uL — ABNORMAL HIGH (ref 150–400)
RDW: 16.8 % — ABNORMAL HIGH (ref 11.5–15.5)

## 2010-07-17 LAB — POCT PREGNANCY, URINE: Preg Test, Ur: NEGATIVE

## 2010-07-22 LAB — URINALYSIS, ROUTINE W REFLEX MICROSCOPIC
Bilirubin Urine: NEGATIVE
Glucose, UA: NEGATIVE mg/dL
Hgb urine dipstick: NEGATIVE
Specific Gravity, Urine: 1.015 (ref 1.005–1.030)
Urobilinogen, UA: 0.2 mg/dL (ref 0.0–1.0)

## 2010-07-22 LAB — COMPREHENSIVE METABOLIC PANEL
AST: 26 U/L (ref 0–37)
Albumin: 3.8 g/dL (ref 3.5–5.2)
Alkaline Phosphatase: 73 U/L (ref 39–117)
Chloride: 104 mEq/L (ref 96–112)
GFR calc Af Amer: >60 mL/min (ref >60)
Potassium: 3.3 mEq/L — ABNORMAL LOW (ref 3.5–5.1)
Total Bilirubin: 0.4 mg/dL (ref 0.3–1.2)

## 2010-07-22 LAB — DIFFERENTIAL
Basophils Absolute: 0 10*3/uL (ref 0.0–0.1)
Basophils Relative: 0 % (ref 0–1)
Eosinophils Relative: 2 % (ref 0–5)
Lymphocytes Relative: 17 % (ref 12–46)
Monocytes Absolute: 0.7 10*3/uL (ref 0.1–1.0)

## 2010-07-22 LAB — CBC
Platelets: 396 10*3/uL (ref 150–400)
WBC: 12.3 10*3/uL — ABNORMAL HIGH (ref 4.0–10.5)

## 2010-08-01 LAB — WET PREP, GENITAL
Trich, Wet Prep: NONE SEEN
Yeast Wet Prep HPF POC: NONE SEEN

## 2010-08-01 LAB — URINALYSIS, ROUTINE W REFLEX MICROSCOPIC
Bilirubin Urine: NEGATIVE
Hgb urine dipstick: NEGATIVE
Ketones, ur: NEGATIVE mg/dL
Protein, ur: NEGATIVE mg/dL
Urobilinogen, UA: 0.2 mg/dL (ref 0.0–1.0)

## 2010-08-01 LAB — GC/CHLAMYDIA PROBE AMP, GENITAL
Chlamydia, DNA Probe: NEGATIVE
GC Probe Amp, Genital: NEGATIVE

## 2010-09-01 NOTE — Discharge Summary (Signed)
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 The patient has preoperatively been  followed for approximately 2 years by my office and her orthodontist for  orthodontic alignment of the maxillary mandibular teeth and surgical  models were completed to simulate her surgery and produce a surgical  splint to guide the surgical positioning of her jaw. She has had  difficulty chewing food because of the malposition of her jaw and had  sought surgical correction. The discrepancy was so great that  orthodontic treatment alone could not correct her malocclusion. She has  also had a history of chronic anemia and been on iron therapy but hey  have been unable to bring her hemoglobin up greater then 10.5.   On the day of admission, the patient was taken to surgery and under  general nasotracheal anesthesia, a bilateral sagittal split osteotomy of  the mandible was completed with rigid fixation. There was an unusual  fracture of the left ramus of the mandible; however, the surgery was  completed in the usual manner and rigid fixation was able to correct the  fracture. Position was exactly as planned on the surgical models.   Blood loss was minimal and postoperative hemoglobin and hematocrit  showed a hemoglobin of 9.2 and a hematocrit of 29.8.   Postoperatively, the patient experienced nausea and vomiting for the  first day. This is cleared and she is now taking clear and full liquids  without complications.  She remains in light intermaxillary fixation with  elastics but is taking fluid well.   The patient is also ambulating without complications. Home care  instructions were reviewed with the patient including dietary  instructions for full liquid and mechanically soft diet. Instructions on  oral hygiene, instruction on how to remove the maxillomandibular  fixation should emesis and airway obstruction occur and given a pair of  scissors to carry with her at all times.   She has also been instructed to return to her normal iron supplement and  vitamin medications for her anemia.   DISCHARGE MEDICATIONS:  1. Keflex 500 mg 1 tablet q.i.d.  2. Decadron 4 mg 3 times daily for 3 days.  3. Percocet 1 tablet every 4 hours as needed for pain.   FINAL DIAGNOSES:  1. 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 Unable to      treat with orthodontic treatment alone.  2. Chronic anemia, possible iron deficiency anemia.           ______________________________  Grant Ruts., D.D.S.     WB/MEDQ  D:  08/10/2006  T:  08/10/2006  Job:  [id number]

## 2010-09-01 NOTE — Op Note (Signed)
NAMEGIRTHA, Cheyenne Arias             ACCOUNT NO.:  [id number]   MEDICAL RECORD NO.:  [id number]          PATIENT TYPE:  AMB   LOCATION:  DAY                          FACILITY:  [facility]   PHYSICIAN:  Grant Ruts., D.D.S.DATE OF BIRTH:  19-Jun-1975   DATE OF PROCEDURE:  08/08/2006  DATE OF DISCHARGE:                               OPERATIVE REPORT   PREOPERATIVE DIAGNOSIS:  Mandibular sagittal access with Class III  malocclusion.   POSTOPERATIVE DIAGNOSIS:  Mandibular sagittal access with Class III  malocclusion.   TITLE OF PROCEDURE:  Bilateral sagittal split osteotomy of the mandible  with advancement and rigid fixation.   SURGEON:  1. Dr. Gwendlyn Deutscher.  2. Dr. Inda Coke.   ANESTHESIA:  General.   ESTIMATED BLOOD LOSS:  150 mL.   INDICATION FOR PROCEDURE:  This 35 year old female has been under  orthodontics treatment and was diagnosed as having a mandibular sagittal  access with Class III malocclusion that could not be treated with  orthodontic treatment alone and was referred to our office to evaluate  for surgical correction.  Panorex x-ray, sulfametric x-ray showed that  the mandible had an underjet of approximately -5 mm and surgical models  were completed as surgical splint guide was completed prior to the  surgery.  The risks and complications were explained to the patient.   PROCEDURE AND FINDINGS:  The patient was brought to the operating room,  placed in the supine position on the operating table.  After  satisfactory nasotracheal anesthesia was achieved, a warm moist sponge  is placed in the oropharynx and the oral cavity was prepped with a  Betadine scrub and Betadine paint, draped with 4 towels, split sheet and  a head sheet.   Using 0.5% Marcaine with 1:200,000 epinephrine bilateral inferior  alveolar and inguinal nerve blocks. Were achieved and buccal nerve  blocks were achieved for hemostasis and postoperative comfort.  Using  electrocautery, an  incision was made along the anterior border of the  ascending ramus to the mandible intraorally extending from the mid  portion of the ramus anteriorly and inferiorly to the second molar  tooth.  The incision was carried through mucosa, submucosa layers,  buccinator muscle, temporalis muscle, periosteum down to bone.  A  subperiosteal dissection was carried out superiorly along the external  oblique ridge of the mandible and lingual to the ramus of the mandible  and buccal to the ramus of the mandible to the inferior border.  An  appropriate retractor was placed on the medial aspect of the mandible,  the lingula was identified and using a rotary osteotome a horizontal  bone cut was made on the lingual aspect of the ramus of the mandible  just superior to the lingula through the medial cortical plate.  A #7-0  bur was then used to complete a sagittal cut in the mandible between the  external and internal oblique ridges anteriorly and inferiorly to the  second molar tooth.  A __________ retractor was placed on the inferior  border of the mandible and a buccal bone cut was made to the body of the  mandible from the inferior border superiorly to connect with the  sagittal cut.  Using thin osteotomes, the sagittal split osteotomy was  deepened.  Using a larger chisel the split was assisted and then using  Smith splitting instruments the sagittal split was completed of the  right ramus of the mandible in the traditional manner.  A J stripper was  used to remove the digastric muscle inferiorly and the attachments of  the medial pterygoid muscle.  The mandible was mobilized well.  A high  __________ was placed and a similar incision and osteotomy was completed  on the left ramus of the mandible.  On the left ramus of the mandible it  was noted there was an unfavorable fracture of the proximal segment;  however, this could be repaired with rigid fixation.   The mandible was easily mobilized.   Additional bone was removed from the  proximal segments on both sides.  The mandible was posteriorly  repositioned approximately 5 mm into the surgical splint guide and the  patient was placed into intermaxillary fixation.  This aligned the  mandible in a Class I jaw relationship as predicted from the surgical  models.  Attention was then carried out to the ramus of the mandible.  The proximal segment on the left side was adapted to the distal segments  and bicortical 2 mm titanium screws were placed through the superior  border of the mandible using a rigid fixation.  The anterior portion of  the proximal segment was fractured and this was fixated with two  bicortical screws in a noncompression manner.   Attention was carried out to the right ramus in the mandible because of  the large cheeks.  The angle could not be achieved to place the  transosseous rigid fixation and a small incision of approximately 3 mm  was made in the right cheek and blunt dissection was used to carry  through and place a trocar through the sheath to assist in placing the 2  mm titanium screws.  The proximal segment was positioned with the  condyle in the most posterior superior position of the glenoid fossa and  rigid fixation was placed with 2 mm transosseous screws, placing 4  screws in the right ramus and mandible and this allowed good fixation.  The maxillary fixation was removed.  The osteotomy segments were checked  and found to be rigid.  The patient rotated exactly into the  interocclusal splint.  The splint was removed and the patient's teeth  aligned in a Class I position as predicted from the surgical models.   Both incisions were thoroughly irrigated with normal saline and closed  with #4-0 Vicryl sutures in a running fashion.  The cheek incision was  closed with two #5-0 gut sutures and the throat pack was removed, intermaxillary fixation was placed using wide elastics on each side to  allow the  patient to move her jaw and open and maintain a good airway.   TISSUE REMOVED:  None.   ESTIMATED BLOOD LOSS:  150 mL.   COMPLICATIONS:  None.           ______________________________  Grant Ruts., D.D.S.     WB/MEDQ  D:  08/08/2006  T:  08/08/2006  Job:  [id number]

## 2010-09-17 ENCOUNTER — Inpatient Hospital Stay (HOSPITAL_COMMUNITY)
Admission: AD | Admit: 2010-09-17 | Discharge: 2010-09-17 | Disposition: A | Discharge status: Home / Self Care | Payer: Medicaid Other | Source: Ambulatory Visit | Attending: Obstetrics and Gynecology | Admitting: Obstetrics and Gynecology

## 2010-09-17 DIAGNOSIS — B373 Candidiasis of vulva and vagina: Secondary | ICD-10-CM | POA: Insufficient documentation

## 2010-09-17 DIAGNOSIS — N949 Unspecified condition associated with female genital organs and menstrual cycle: Secondary | ICD-10-CM | POA: Insufficient documentation

## 2010-09-17 DIAGNOSIS — B3731 Acute candidiasis of vulva and vagina: Secondary | ICD-10-CM | POA: Insufficient documentation

## 2010-09-17 DIAGNOSIS — N899 Noninflammatory disorder of vagina, unspecified: Secondary | ICD-10-CM | POA: Insufficient documentation

## 2010-09-17 LAB — URINALYSIS, ROUTINE W REFLEX MICROSCOPIC
Nitrite: NEGATIVE
Specific Gravity, Urine: 1.01 (ref 1.005–1.030)
Urobilinogen, UA: 0.2 mg/dL (ref 0.0–1.0)
pH: 6 (ref 5.0–8.0)

## 2010-09-17 LAB — POCT PREGNANCY, URINE: Preg Test, Ur: NEGATIVE

## 2010-09-17 LAB — WET PREP, GENITAL

## 2010-09-18 LAB — URINE CULTURE: Culture  Setup Time: 201206031725

## 2010-10-06 ENCOUNTER — Emergency Department (HOSPITAL_BASED_OUTPATIENT_CLINIC_OR_DEPARTMENT_OTHER)
Admission: EM | Admit: 2010-10-06 | Discharge: 2010-10-06 | Disposition: A | Discharge status: Home / Self Care | Payer: Medicaid Other | Attending: Emergency Medicine | Admitting: Emergency Medicine

## 2010-10-06 DIAGNOSIS — IMO0001 Reserved for inherently not codable concepts without codable children: Secondary | ICD-10-CM | POA: Insufficient documentation

## 2010-10-06 DIAGNOSIS — G43909 Migraine, unspecified, not intractable, without status migrainosus: Secondary | ICD-10-CM | POA: Insufficient documentation

## 2010-10-06 DIAGNOSIS — F410 Panic disorder [episodic paroxysmal anxiety] without agoraphobia: Secondary | ICD-10-CM | POA: Insufficient documentation

## 2010-10-24 ENCOUNTER — Other Ambulatory Visit: Payer: Self-pay | Admitting: Hematology and Oncology

## 2010-10-24 ENCOUNTER — Encounter (HOSPITAL_BASED_OUTPATIENT_CLINIC_OR_DEPARTMENT_OTHER): Payer: Medicaid Other | Admitting: Hematology and Oncology

## 2010-10-24 DIAGNOSIS — N920 Excessive and frequent menstruation with regular cycle: Secondary | ICD-10-CM

## 2010-10-24 DIAGNOSIS — IMO0001 Reserved for inherently not codable concepts without codable children: Secondary | ICD-10-CM

## 2010-10-24 DIAGNOSIS — D649 Anemia, unspecified: Secondary | ICD-10-CM

## 2010-10-24 DIAGNOSIS — D638 Anemia in other chronic diseases classified elsewhere: Secondary | ICD-10-CM

## 2010-10-24 LAB — IRON AND TIBC
%SAT: 5 % — ABNORMAL LOW (ref 20–55)
Iron: 19 ug/dL — ABNORMAL LOW (ref 42–145)
TIBC: 367 ug/dL (ref 250–470)

## 2010-10-24 LAB — FERRITIN: Ferritin: 91 ng/mL (ref 10–291)

## 2010-10-24 LAB — CBC WITH DIFFERENTIAL/PLATELET
Eosinophils Absolute: 0.3 10*3/uL (ref 0.0–0.5)
HCT: 31.7 % — ABNORMAL LOW (ref 34.8–46.6)
HGB: 10 g/dL — ABNORMAL LOW (ref 11.6–15.9)
LYMPH%: 16.5 % (ref 14.0–49.7)
MONO#: 0.5 10*3/uL (ref 0.1–0.9)
NEUT#: 7.3 10*3/uL — ABNORMAL HIGH (ref 1.5–6.5)
NEUT%: 75.2 % (ref 38.4–76.8)
Platelets: 390 10*3/uL (ref 145–400)
WBC: 9.8 10*3/uL (ref 3.9–10.3)
lymph#: 1.6 10*3/uL (ref 0.9–3.3)

## 2010-10-24 LAB — COMPREHENSIVE METABOLIC PANEL
CO2: 27 mEq/L (ref 19–32)
Calcium: 9.1 mg/dL (ref 8.4–10.5)
Chloride: 100 mEq/L (ref 96–112)
Creatinine, Ser: 0.65 mg/dL (ref 0.50–1.10)
Glucose, Bld: 102 mg/dL — ABNORMAL HIGH (ref 70–99)
Total Bilirubin: 0.2 mg/dL — ABNORMAL LOW (ref 0.3–1.2)
Total Protein: 7.9 g/dL (ref 6.0–8.3)

## 2010-11-03 ENCOUNTER — Encounter (HOSPITAL_BASED_OUTPATIENT_CLINIC_OR_DEPARTMENT_OTHER): Payer: Medicaid Other | Admitting: Hematology and Oncology

## 2010-11-03 DIAGNOSIS — D509 Iron deficiency anemia, unspecified: Secondary | ICD-10-CM

## 2011-01-03 ENCOUNTER — Encounter (HOSPITAL_COMMUNITY): Payer: Self-pay

## 2011-01-03 ENCOUNTER — Inpatient Hospital Stay (HOSPITAL_COMMUNITY)
Admission: AD | Admit: 2011-01-03 | Discharge: 2011-01-03 | Disposition: A | Discharge status: Home / Self Care | Payer: Medicaid Other | Source: Ambulatory Visit | Attending: Obstetrics and Gynecology | Admitting: Obstetrics and Gynecology

## 2011-01-03 DIAGNOSIS — B3731 Acute candidiasis of vulva and vagina: Secondary | ICD-10-CM | POA: Insufficient documentation

## 2011-01-03 DIAGNOSIS — N909 Noninflammatory disorder of vulva and perineum, unspecified: Secondary | ICD-10-CM | POA: Insufficient documentation

## 2011-01-03 DIAGNOSIS — IMO0001 Reserved for inherently not codable concepts without codable children: Secondary | ICD-10-CM | POA: Insufficient documentation

## 2011-01-03 DIAGNOSIS — I10 Essential (primary) hypertension: Secondary | ICD-10-CM

## 2011-01-03 DIAGNOSIS — Z7251 High risk heterosexual behavior: Secondary | ICD-10-CM

## 2011-01-03 DIAGNOSIS — B373 Candidiasis of vulva and vagina: Secondary | ICD-10-CM | POA: Insufficient documentation

## 2011-01-03 DIAGNOSIS — N760 Acute vaginitis: Secondary | ICD-10-CM | POA: Insufficient documentation

## 2011-01-03 DIAGNOSIS — M797 Fibromyalgia: Secondary | ICD-10-CM

## 2011-01-03 HISTORY — DX: Depression, unspecified: F32.A

## 2011-01-03 HISTORY — DX: Essential (primary) hypertension: I10

## 2011-01-03 HISTORY — DX: Fibromyalgia: M79.7

## 2011-01-03 HISTORY — DX: Anxiety disorder, unspecified: F41.9

## 2011-01-03 HISTORY — DX: Major depressive disorder, single episode, unspecified: F32.9

## 2011-01-03 LAB — URINALYSIS, ROUTINE W REFLEX MICROSCOPIC
Bilirubin Urine: NEGATIVE
Glucose, UA: NEGATIVE mg/dL
Ketones, ur: NEGATIVE mg/dL
Nitrite: NEGATIVE
Protein, ur: NEGATIVE mg/dL

## 2011-01-03 LAB — URINE MICROSCOPIC-ADD ON

## 2011-01-03 LAB — HEPATITIS C ANTIBODY: HCV Ab: NEGATIVE

## 2011-01-03 LAB — RAPID HIV SCREEN (WH-MAU): Rapid HIV Screen: NONREACTIVE

## 2011-01-03 LAB — RPR: RPR Ser Ql: NONREACTIVE

## 2011-01-03 LAB — WET PREP, GENITAL

## 2011-01-03 MED ORDER — NYSTATIN-TRIAMCINOLONE 100000-0.1 UNIT/GM-% EX OINT: TOPICAL_OINTMENT | Freq: Three times a day (TID) | CUTANEOUS | Status: DC

## 2011-01-03 MED ORDER — FLUCONAZOLE 200 MG PO TABS: 400.0000 mg | ORAL_TABLET | Freq: Once | ORAL | Status: AC

## 2011-01-03 NOTE — Progress Notes (Signed)
Pt states used Darene Lamer in shower Friday, had sex Friday, Tuesday noted swollen labia, pt states burning with intercourse. Denies bleeding or lof.

## 2011-01-03 NOTE — ED Provider Notes (Signed)
History   [name] is a 35 y.o. SBF P2 who presents unannounced with CC of swollen and irritated vulva.  Used "Cheyenne Arias" in shower last Friday and used wash cloth removed cream with to wash entire genital area.  Denies putting nair on vulva, but reports shower caused it to run down into area.  Unprotected IC day before yesterday; would be ok with pregnancy.  Burning with penetration Monday night, and worsening symptoms since.  Has tried "Nystatin cream."   No AEX at [facility] in last yr; no follow-up r/e urinary issues, "bladder dropping," as was instructed to last Oct.  With current partner for 2 months.  Desires STD screening.  Just at PCP Mon. 9/17, and recalls BP 141/100, but PCP didn't specifically bring up or discuss it being a problem.  Recent hypokalemia and took last po dose of K-supplement this AM. 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  Recently frequenting "jacuzzi" for treatment of fibromyalgia.    Current Meds:  Paxil, klonopin, urecholine, naproxen, flexeril, lyrica, percocet, maxalt, amitryptiline  Chief Complaint  Patient presents with  . Groin Swelling   HPI  OB History    Grav Para Term Preterm Abortions TAB SAB Ect Mult Living   2 2 2       2       Past Medical History  Diagnosis Date  . Fibromyalgia   . Hypertension   . Migraine   . Anxiety   . Depression   . Abnormal Pap smear     Past Surgical History  Procedure Date  . Cesarean section   . Wisdom tooth extraction   . Mandible surgery   . Carpal tunnel release     No family history on file.  History  Substance Use Topics  . Smoking status: Never Smoker   . Smokeless tobacco: Never Used  . Alcohol Use: No    Allergies:  Allergies  Allergen Reactions  . Latex Itching and Swelling    Blisters  . Morphine And Related Itching    No prescriptions prior to admission    Review of  Systems  Constitutional: Negative.   Respiratory: Negative.   Cardiovascular: Negative.   Gastrointestinal: Negative.   Genitourinary: Negative.    Physical Exam  Initial BP=160/101 Blood pressure 151/90, pulse 103, temperature 98.4 F (36.9 C), temperature source Oral, resp. rate 20, height 5' 1.5" (1.562 m), weight 127.688 kg (281 lb 8 oz), last menstrual period 12/02/2010.  Physical Exam  Constitutional: She is oriented to person, place, and time. She appears well-developed and well-nourished. No distress.       Reports recent LE edema  Cardiovascular: Normal rate.   Respiratory: Effort normal.  GI: Soft. Bowel sounds are normal.  Genitourinary:       Ext genitalia wnl except slightly swollen labia (Lt>Rt); no redness.  Introitus with erythema; Ant cx difficult to view. Hair on vulva WNL.  Neurological: She is alert and oriented to person, place, and time.  Skin: Skin is warm and dry. She is not diaphoretic.    MAU Course  Procedures 1.  Spec & bimanual exam 2.  U/a--WNL except cloudy, tr leuks, many sq epith, few bacteria 3.  Wet Prep--WNL except few yeast 4.  Gc/ct--probe via urine (difficulty viewing cx secondary to ant cx, lax tissue) 5.  UPT-negative 6.  HIV, RPR, HBsAg, Hep C pending at time of d/c  Assessment and Plan  1.  Nonpregnant obese black female 2.  Vulvovaginitis r/t recent misuse of depilatory 3.  Yeast on Wet prep 4.  Lack of treatment for probable chronic HTN 5.  Obese 6.  No contraception--last unprotected sex 2 days ago 7.  Fibromyalgia 8.  Transportation issues/freq ED visits  1.  Pericare/hygeine disc'd  2.  Diflucan 400mg  po x1--Rx given 3.  Mycolog II 30g ointment Rx given if symptoms not improving over next 48hrs 4.  Rec'd pt make appointment this month at [facility] for pap/Aex and f/u with PCP ASAP r/e HTN 5.  D/c home--f/u prn  Fidela Cieslak H 01/03/2011, 3:15 PM

## 2011-01-16 ENCOUNTER — Emergency Department (HOSPITAL_BASED_OUTPATIENT_CLINIC_OR_DEPARTMENT_OTHER)
Admission: EM | Admit: 2011-01-16 | Discharge: 2011-01-16 | Disposition: A | Discharge status: Home / Self Care | Payer: Medicaid Other | Attending: Emergency Medicine | Admitting: Emergency Medicine

## 2011-01-16 ENCOUNTER — Encounter (HOSPITAL_BASED_OUTPATIENT_CLINIC_OR_DEPARTMENT_OTHER): Payer: Self-pay | Admitting: *Deleted

## 2011-01-16 ENCOUNTER — Emergency Department (INDEPENDENT_AMBULATORY_CARE_PROVIDER_SITE_OTHER): Payer: Medicaid Other

## 2011-01-16 DIAGNOSIS — I10 Essential (primary) hypertension: Secondary | ICD-10-CM | POA: Insufficient documentation

## 2011-01-16 DIAGNOSIS — W07XXXA Fall from chair, initial encounter: Secondary | ICD-10-CM | POA: Insufficient documentation

## 2011-01-16 DIAGNOSIS — Z79899 Other long term (current) drug therapy: Secondary | ICD-10-CM | POA: Insufficient documentation

## 2011-01-16 DIAGNOSIS — R51 Headache: Secondary | ICD-10-CM

## 2011-01-16 DIAGNOSIS — IMO0001 Reserved for inherently not codable concepts without codable children: Secondary | ICD-10-CM | POA: Insufficient documentation

## 2011-01-16 DIAGNOSIS — Y92009 Unspecified place in unspecified non-institutional (private) residence as the place of occurrence of the external cause: Secondary | ICD-10-CM | POA: Insufficient documentation

## 2011-01-16 DIAGNOSIS — R42 Dizziness and giddiness: Secondary | ICD-10-CM | POA: Insufficient documentation

## 2011-01-16 LAB — BASIC METABOLIC PANEL
BUN: 15 mg/dL (ref 6–23)
Calcium: 9.4 mg/dL (ref 8.4–10.5)
Chloride: 102 mEq/L (ref 96–112)
Creatinine, Ser: 0.7 mg/dL (ref 0.50–1.10)
GFR calc Af Amer: >90 mL/min (ref >90)
GFR calc non Af Amer: >90 mL/min (ref >90)

## 2011-01-16 LAB — URINALYSIS, ROUTINE W REFLEX MICROSCOPIC
Glucose, UA: NEGATIVE mg/dL
Ketones, ur: NEGATIVE mg/dL
Leukocytes, UA: NEGATIVE
Nitrite: NEGATIVE
Protein, ur: NEGATIVE mg/dL

## 2011-01-16 LAB — D-DIMER, QUANTITATIVE: D-Dimer, Quant: 0.32 ug/mL-FEU (ref 0.00–0.48)

## 2011-01-16 LAB — PREGNANCY, URINE: Preg Test, Ur: NEGATIVE

## 2011-01-16 MED ORDER — SODIUM CHLORIDE 0.9 % IV BOLUS (SEPSIS)
1000.0000 mL | Freq: Once | INTRAVENOUS | Status: AC
  Administered 2011-01-16: 1000 mL via INTRAVENOUS

## 2011-01-16 MED ORDER — MECLIZINE HCL 25 MG PO TABS
25.0000 mg | ORAL_TABLET | Freq: Once | ORAL | Status: AC
  Administered 2011-01-16: 25 mg via ORAL
  Filled 2011-01-16: qty 1

## 2011-01-16 MED ORDER — MECLIZINE HCL 12.5 MG PO TABS: 25.0000 mg | ORAL_TABLET | Freq: Three times a day (TID) | ORAL | Status: AC | PRN

## 2011-01-16 NOTE — ED Notes (Signed)
Fell backward in her recliner 4 days ago. Woke with nausea. Feels dizzy and shaky.

## 2011-01-16 NOTE — ED Provider Notes (Signed)
History     CSN: [id number] Arrival date & time: 01/16/2011  1:14 PM  Chief Complaint  Patient presents with  . Fall    (Consider location/radiation/quality/duration/timing/severity/associated sxs/prior treatment) HPI Comments: Pt states that for the last 3 days she has been very dizzy with some nausea and has developed a headache. Pt states that she fell out of recliner 4 days ago and then the next morning the symptoms started:pt states that she doesn't even feel like she hit her head:pt denies being sob or having cp:pt states that she has been being treated for uti and she is on her second antibiotics for the it:pt states that is like some is shaking her when she is dizzy  The history is provided by the patient. No language interpreter was used.    Past Medical History  Diagnosis Date  . Fibromyalgia   . Hypertension   . Migraine   . Anxiety   . Depression   . Abnormal Pap smear     Past Surgical History  Procedure Date  . Cesarean section   . Wisdom tooth extraction   . Mandible surgery   . Carpal tunnel release     No family history on file.  History  Substance Use Topics  . Smoking status: Never Smoker   . Smokeless tobacco: Never Used  . Alcohol Use: No    OB History    Grav Para Term Preterm Abortions TAB SAB Ect Mult Living   2 2 2       2       Review of Systems  All other systems reviewed and are negative.    Allergies  Latex and Morphine and related  Home Medications   Current Outpatient Rx  Name Route Sig Dispense Refill  . AMITRIPTYLINE HCL 100 MG PO TABS Oral Take 100 mg by mouth at bedtime.      . BETHANECHOL CHLORIDE 5 MG PO TABS Oral Take 5 mg by mouth 3 (three) times daily.      [name] CLONAZEPAM 0.5 MG PO TABS Oral Take 0.5 mg by mouth 3 (three) times daily.      . CYCLOBENZAPRINE HCL 10 MG PO TABS Oral Take 10 mg by mouth 3 (three) times daily.      [name] NAPROXEN 500 MG PO TABS Oral Take 500 mg by mouth 2 (two) times daily with a meal.        . NYSTATIN-TRIAMCINOLONE 100000-0.1 UNIT/GM-% EX OINT Topical Apply topically 3 (three) times daily. 30 g 1  . ONDANSETRON 4 MG PO TBDP Oral Take 4 mg by mouth as needed. For nausea     . OXYCODONE-ACETAMINOPHEN 5-325 MG PO TABS Oral Take 1 tablet by mouth 3 (three) times daily.      [name] PAROXETINE HCL 20 MG PO TABS Oral Take 20 mg by mouth daily.      [name] POTASSIUM CHLORIDE 10 MEQ PO TBCR Oral Take 10 mEq by mouth daily.      [name] PREGABALIN 150 MG PO CAPS Oral Take 150 mg by mouth 2 (two) times daily.      [name] RIZATRIPTAN BENZOATE 10 MG PO TBDP Oral Take 10 mg by mouth as needed. May repeat in 2 hours if needed, for migrains       BP 128/61  Pulse 116  Temp(Src) 98.1 F (36.7 C) (Oral)  Resp 22  SpO2 99%  LMP 12/02/2010  Physical Exam  Nursing note and vitals reviewed. Constitutional: She is oriented to person, place, and  time. She appears well-developed and well-nourished.  HENT:  Head: Normocephalic and atraumatic.  Eyes: Pupils are equal, round, and reactive to light.  Cardiovascular: Normal rate and regular rhythm.   Pulmonary/Chest: Effort normal.  Abdominal: Soft. Bowel sounds are normal.  Musculoskeletal: Normal range of motion.  Neurological: She is alert and oriented to person, place, and time.  Skin: Skin is dry.  Psychiatric: She has a normal mood and affect.    ED Course  Procedures (including critical care time)  Labs Reviewed  URINALYSIS, ROUTINE W REFLEX MICROSCOPIC - Abnormal; Notable for the following:    Appearance CLOUDY (*)    All other components within normal limits  BASIC METABOLIC PANEL - Abnormal; Notable for the following:    Glucose, Bld 117 (*)    All other components within normal limits  PREGNANCY, URINE  D-DIMER, QUANTITATIVE   No results found.   No diagnosis found.    MDM  Pt feeling better after meclizine:will treat for vertigo:pt is ambulating by herself without any problem        Teressa Lower, NP 01/16/11 1607  Medical  screening examination/treatment/procedure(s) were performed by non-physician practitioner and as supervising physician I was immediately available for consultation/collaboration.  Ethelda Chick, MD 01/16/11 [phone]

## 2011-01-24 ENCOUNTER — Other Ambulatory Visit: Payer: Self-pay | Admitting: Hematology and Oncology

## 2011-01-24 ENCOUNTER — Encounter (HOSPITAL_BASED_OUTPATIENT_CLINIC_OR_DEPARTMENT_OTHER): Payer: Medicaid Other | Admitting: Hematology and Oncology

## 2011-01-24 DIAGNOSIS — D649 Anemia, unspecified: Secondary | ICD-10-CM

## 2011-01-24 DIAGNOSIS — D638 Anemia in other chronic diseases classified elsewhere: Secondary | ICD-10-CM

## 2011-01-24 DIAGNOSIS — IMO0001 Reserved for inherently not codable concepts without codable children: Secondary | ICD-10-CM

## 2011-01-24 DIAGNOSIS — N920 Excessive and frequent menstruation with regular cycle: Secondary | ICD-10-CM

## 2011-01-24 LAB — CBC WITH DIFFERENTIAL/PLATELET
Basophils Absolute: 0 10*3/uL (ref 0.0–0.1)
EOS%: 3.5 % (ref 0.0–7.0)
Eosinophils Absolute: 0.4 10*3/uL (ref 0.0–0.5)
HCT: 37.4 % (ref 34.8–46.6)
HGB: 12.1 g/dL (ref 11.6–15.9)
MCH: 24.5 pg — ABNORMAL LOW (ref 25.1–34.0)
MCV: 75.5 fL — ABNORMAL LOW (ref 79.5–101.0)
MONO%: 5 % (ref 0.0–14.0)
NEUT%: 70.9 % (ref 38.4–76.8)
Platelets: 387 10*3/uL (ref 145–400)

## 2011-01-24 LAB — BASIC METABOLIC PANEL
BUN: 13 mg/dL (ref 6–23)
Creatinine, Ser: 0.77 mg/dL (ref 0.50–1.10)
Glucose, Bld: 88 mg/dL (ref 70–99)

## 2011-01-24 LAB — IRON AND TIBC
Iron: 25 ug/dL — ABNORMAL LOW (ref 42–145)
UIBC: 314 ug/dL (ref 125–400)

## 2011-01-24 LAB — FERRITIN: Ferritin: 212 ng/mL (ref 10–291)

## 2011-02-16 ENCOUNTER — Emergency Department (HOSPITAL_COMMUNITY)
Admission: EM | Admit: 2011-02-16 | Discharge: 2011-02-17 | Disposition: A | Discharge status: Home / Self Care | Payer: Medicaid Other | Attending: Emergency Medicine | Admitting: Emergency Medicine

## 2011-02-16 ENCOUNTER — Emergency Department (HOSPITAL_COMMUNITY): Payer: Medicaid Other

## 2011-02-16 DIAGNOSIS — Z79899 Other long term (current) drug therapy: Secondary | ICD-10-CM | POA: Insufficient documentation

## 2011-02-16 DIAGNOSIS — F341 Dysthymic disorder: Secondary | ICD-10-CM | POA: Insufficient documentation

## 2011-02-16 DIAGNOSIS — R5381 Other malaise: Secondary | ICD-10-CM | POA: Insufficient documentation

## 2011-02-16 DIAGNOSIS — M545 Low back pain, unspecified: Secondary | ICD-10-CM | POA: Insufficient documentation

## 2011-02-16 DIAGNOSIS — H53149 Visual discomfort, unspecified: Secondary | ICD-10-CM | POA: Insufficient documentation

## 2011-02-16 DIAGNOSIS — M546 Pain in thoracic spine: Secondary | ICD-10-CM | POA: Insufficient documentation

## 2011-02-16 DIAGNOSIS — IMO0001 Reserved for inherently not codable concepts without codable children: Secondary | ICD-10-CM | POA: Insufficient documentation

## 2011-02-16 LAB — BASIC METABOLIC PANEL
Chloride: 96 mEq/L (ref 96–112)
GFR calc Af Amer: 86 mL/min — ABNORMAL LOW (ref >90)
Potassium: 3.3 mEq/L — ABNORMAL LOW (ref 3.5–5.1)
Sodium: 133 mEq/L — ABNORMAL LOW (ref 135–145)

## 2011-02-16 LAB — DIFFERENTIAL
Basophils Absolute: 0 10*3/uL (ref 0.0–0.1)
Eosinophils Absolute: 0.3 10*3/uL (ref 0.0–0.7)
Eosinophils Relative: 3 % (ref 0–5)
Neutrophils Relative %: 79 % — ABNORMAL HIGH (ref 43–77)

## 2011-02-16 LAB — URINALYSIS, ROUTINE W REFLEX MICROSCOPIC
Bilirubin Urine: NEGATIVE
Hgb urine dipstick: NEGATIVE
Ketones, ur: NEGATIVE mg/dL
Nitrite: NEGATIVE
Specific Gravity, Urine: 1.02 (ref 1.005–1.030)
pH: 5 (ref 5.0–8.0)

## 2011-02-16 LAB — CBC
Platelets: 367 10*3/uL (ref 150–400)
RDW: 18.1 % — ABNORMAL HIGH (ref 11.5–15.5)
WBC: 13 10*3/uL — ABNORMAL HIGH (ref 4.0–10.5)

## 2011-02-16 LAB — D-DIMER, QUANTITATIVE: D-Dimer, Quant: 0.47 ug/mL-FEU (ref 0.00–0.48)

## 2011-03-04 ENCOUNTER — Emergency Department (HOSPITAL_COMMUNITY)
Admission: EM | Admit: 2011-03-04 | Discharge: 2011-03-04 | Disposition: A | Discharge status: Home / Self Care | Payer: Medicaid Other | Attending: Emergency Medicine | Admitting: Emergency Medicine

## 2011-03-04 ENCOUNTER — Encounter (HOSPITAL_COMMUNITY): Payer: Self-pay

## 2011-03-04 DIAGNOSIS — M545 Low back pain, unspecified: Secondary | ICD-10-CM | POA: Insufficient documentation

## 2011-03-04 DIAGNOSIS — R5381 Other malaise: Secondary | ICD-10-CM | POA: Insufficient documentation

## 2011-03-04 DIAGNOSIS — M549 Dorsalgia, unspecified: Secondary | ICD-10-CM | POA: Insufficient documentation

## 2011-03-04 DIAGNOSIS — R5383 Other fatigue: Secondary | ICD-10-CM | POA: Insufficient documentation

## 2011-03-04 DIAGNOSIS — I10 Essential (primary) hypertension: Secondary | ICD-10-CM | POA: Insufficient documentation

## 2011-03-04 LAB — URINALYSIS, ROUTINE W REFLEX MICROSCOPIC
Bilirubin Urine: NEGATIVE
Glucose, UA: NEGATIVE mg/dL
Hgb urine dipstick: NEGATIVE
Specific Gravity, Urine: 1.029 (ref 1.005–1.030)
pH: 5 (ref 5.0–8.0)

## 2011-03-04 MED ORDER — HYDROMORPHONE HCL PF 2 MG/ML IJ SOLN
2.0000 mg | Freq: Once | INTRAMUSCULAR | Status: AC
  Administered 2011-03-04: 2 mg via INTRAMUSCULAR
  Filled 2011-03-04: qty 1

## 2011-03-04 MED ORDER — ONDANSETRON 8 MG PO TBDP
8.0000 mg | ORAL_TABLET | Freq: Once | ORAL | Status: AC
  Administered 2011-03-04: 8 mg via ORAL
  Filled 2011-03-04: qty 1

## 2011-03-04 MED ORDER — OXYCODONE-ACETAMINOPHEN 5-325 MG PO TABS: 1.0000 | ORAL_TABLET | Freq: Four times a day (QID) | ORAL | Status: AC | PRN

## 2011-03-04 NOTE — ED Notes (Signed)
Pt aware of the need for a urine sample. Patient is unable to void at this time and states "I'm not ready to go yet."

## 2011-03-04 NOTE — ED Notes (Signed)
Pt amb indep with steady gait to discharge window.

## 2011-03-04 NOTE — ED Provider Notes (Signed)
History     CSN: [id number] Arrival date & time: 03/04/2011  8:03 AM   First MD Initiated Contact with Patient 03/04/11 0818      Chief Complaint  Patient presents with  . Back Pain    pt in from home with c/o mid lower back pain denies recent injury states a hx of chronic back pain states pain radiaites down bilateral leg with pain when walking    (Consider location/radiation/quality/duration/timing/severity/associated sxs/prior treatment) Patient is a 35 y.o. female presenting with back pain. The history is provided by the patient.  Back Pain  This is a chronic problem. The current episode started 2 days ago. The problem occurs constantly. The problem has been gradually worsening. The pain is associated with falling and twisting. The pain is present in the lumbar spine. The quality of the pain is described as stabbing and shooting. The pain does not radiate. The pain is at a severity of 10/10. The pain is severe. The symptoms are aggravated by bending, twisting and certain positions. The pain is the same all the time. Stiffness is present all day. Pertinent negatives include no chest pain, no fever, no headaches, no abdominal pain, no bowel incontinence, no bladder incontinence, no dysuria, no leg pain, no paresthesias, no tingling and no weakness. She has tried analgesics for the symptoms. The treatment provided no relief. Risk factors include obesity and lack of exercise.    Past Medical History  Diagnosis Date  . Fibromyalgia   . Hypertension   . Migraine   . Anxiety   . Depression   . Abnormal Pap smear     Past Surgical History  Procedure Date  . Cesarean section   . Wisdom tooth extraction   . Mandible surgery   . Carpal tunnel release     No family history on file.  History  Substance Use Topics  . Smoking status: Never Smoker   . Smokeless tobacco: Never Used  . Alcohol Use: No    OB History    Grav Para Term Preterm Abortions TAB SAB Ect Mult Living   2 2  2       2       Review of Systems  Constitutional: Negative.  Negative for fever and unexpected weight change.  HENT: Negative.  Negative for congestion, rhinorrhea and neck pain.   Eyes: Negative.  Negative for photophobia and redness.  Cardiovascular: Negative for chest pain and leg swelling.  Gastrointestinal: Negative for nausea, vomiting, abdominal pain, diarrhea and bowel incontinence.  Genitourinary: Negative.  Negative for bladder incontinence, dysuria, hematuria and flank pain.  Musculoskeletal: Positive for back pain.  Skin: Negative for rash.  Neurological: Negative for tingling, weakness, light-headedness, headaches and paresthesias.  All other systems reviewed and are negative.    Allergies  Latex and Morphine and related  Home Medications   Current Outpatient Rx  Name Route Sig Dispense Refill  . AMITRIPTYLINE HCL 100 MG PO TABS Oral Take 100 mg by mouth at bedtime.     . BETHANECHOL CHLORIDE 5 MG PO TABS Oral Take 5 mg by mouth daily as needed. For urinary retention    . CLONAZEPAM 0.5 MG PO TABS Oral Take 0.5 mg by mouth 3 (three) times daily.     . CYCLOBENZAPRINE HCL 10 MG PO TABS Oral Take 10 mg by mouth 3 (three) times daily.     [name] NAPROXEN 500 MG PO TABS Oral Take 500 mg by mouth 2 (two) times daily with a meal.     .  NYSTATIN-TRIAMCINOLONE 100000-0.1 UNIT/GM-% EX OINT Topical Apply topically 3 (three) times daily. 30 g 1  . ONDANSETRON 4 MG PO TBDP Oral Take 4 mg by mouth as needed. For nausea     . OXYCODONE-ACETAMINOPHEN 5-325 MG PO TABS Oral Take 1 tablet by mouth 3 (three) times daily.      [name] PAROXETINE HCL 20 MG PO TABS Oral Take 20 mg by mouth daily.      [name] PREGABALIN 150 MG PO CAPS Oral Take 150 mg by mouth 2 (two) times daily.     [name] RIZATRIPTAN BENZOATE 10 MG PO TBDP Oral Take 10 mg by mouth as needed. May repeat in 2 hours if needed, for migrains      BP 128/69  Pulse 118  Temp 99 F (37.2 C)  Resp 20  SpO2 99%  LMP 02/19/2011  Physical Exam   Nursing note and vitals reviewed. Constitutional: She is oriented to person, place, and time. She appears well-developed and well-nourished. She appears distressed.  HENT:  Head: Normocephalic and atraumatic.  Eyes: EOM are normal. Pupils are equal, round, and reactive to light.  Cardiovascular: Normal rate, regular rhythm, normal heart sounds and intact distal pulses.  Exam reveals no friction rub.   No murmur heard. Pulmonary/Chest: Effort normal and breath sounds normal. She has no wheezes. She has no rales.  Abdominal: Soft. Bowel sounds are normal. She exhibits no distension. There is no tenderness. There is no rebound, no guarding and no CVA tenderness.  Musculoskeletal: Normal range of motion. She exhibits no tenderness.       Lumbar back: She exhibits tenderness, bony tenderness and pain.       No edema  Neurological: She is alert and oriented to person, place, and time.  Reflex Scores:      Patellar reflexes are 1+ on the right side and 1+ on the left side. Skin: Skin is warm and dry. No rash noted.  Psychiatric: She has a normal mood and affect. Her behavior is normal.    ED Course  Procedures (including critical care time)  Results for orders placed during the hospital encounter of 03/04/11  URINALYSIS, ROUTINE W REFLEX MICROSCOPIC      Component Value Range   Color, Urine YELLOW  YELLOW    Appearance CLOUDY (*) CLEAR    Specific Gravity, Urine 1.029  1.005 - 1.030    pH 5.0  5.0 - 8.0    Glucose, UA NEGATIVE  NEGATIVE (mg/dL)   Hgb urine dipstick NEGATIVE  NEGATIVE    Bilirubin Urine NEGATIVE  NEGATIVE    Ketones, ur NEGATIVE  NEGATIVE (mg/dL)   Protein, ur NEGATIVE  NEGATIVE (mg/dL)   Urobilinogen, UA 0.2  0.0 - 1.0 (mg/dL)   Nitrite NEGATIVE  NEGATIVE    Leukocytes, UA NEGATIVE  NEGATIVE    Dg Chest 2 View  02/16/2011  *RADIOLOGY REPORT*  Clinical Data: Body aches, weakness  CHEST - 2 VIEW  Comparison: 03/16/2011  Findings: Lungs are clear. No pleural effusion  or pneumothorax.  The heart is top normal in size.  Visualized osseous structures are within normal limits.  IMPRESSION: No evidence of acute cardiopulmonary disease.  Original Report Authenticated By: Charline Bills, M.D.    v  No results found.   No diagnosis found.    MDM  Pt with gradual onset of back pain suggestive of radiculopathy.  No neurovascular compromise and no incontinence.  Pt with known herniated disc and to get an injection on tues.  Pt  has no infectious sx, hx of CA  or other red flags concerning for pathologic back pain.  Pt is able to ambulate but is painful.  Normal strength and reflexes on exam.  Denies trauma. Pt denies urinary sx but due to temp of 99 will check UA to ensure no other cause for pain.   Will give pt pain control and to return for developement of above sx.    11:53 AM UA neg and pt feeling better after med.  Will d/c home.     Gwyneth Sprout, MD 03/04/11 1154

## 2011-03-12 ENCOUNTER — Ambulatory Visit (HOSPITAL_BASED_OUTPATIENT_CLINIC_OR_DEPARTMENT_OTHER): Payer: Medicaid Other

## 2011-03-27 ENCOUNTER — Ambulatory Visit (HOSPITAL_BASED_OUTPATIENT_CLINIC_OR_DEPARTMENT_OTHER): Payer: Medicaid Other | Attending: Anesthesiology | Admitting: General Practice

## 2011-03-27 VITALS — Ht 62.0 in | Wt 290.0 lb

## 2011-03-27 DIAGNOSIS — G4733 Obstructive sleep apnea (adult) (pediatric): Secondary | ICD-10-CM | POA: Insufficient documentation

## 2011-04-07 DIAGNOSIS — G4733 Obstructive sleep apnea (adult) (pediatric): Secondary | ICD-10-CM

## 2011-04-08 ENCOUNTER — Inpatient Hospital Stay (HOSPITAL_COMMUNITY): Payer: Medicaid Other

## 2011-04-08 ENCOUNTER — Inpatient Hospital Stay (HOSPITAL_COMMUNITY)
Admission: AD | Admit: 2011-04-08 | Discharge: 2011-04-08 | Disposition: A | Discharge status: Home / Self Care | Payer: Medicaid Other | Source: Ambulatory Visit | Attending: Obstetrics and Gynecology | Admitting: Obstetrics and Gynecology

## 2011-04-08 ENCOUNTER — Encounter (HOSPITAL_COMMUNITY): Payer: Self-pay

## 2011-04-08 DIAGNOSIS — R109 Unspecified abdominal pain: Secondary | ICD-10-CM | POA: Insufficient documentation

## 2011-04-08 DIAGNOSIS — O10919 Unspecified pre-existing hypertension complicating pregnancy, unspecified trimester: Secondary | ICD-10-CM

## 2011-04-08 DIAGNOSIS — Z98891 History of uterine scar from previous surgery: Secondary | ICD-10-CM

## 2011-04-08 DIAGNOSIS — O209 Hemorrhage in early pregnancy, unspecified: Secondary | ICD-10-CM | POA: Insufficient documentation

## 2011-04-08 DIAGNOSIS — G4730 Sleep apnea, unspecified: Secondary | ICD-10-CM

## 2011-04-08 LAB — CBC
MCV: 75.1 fL — ABNORMAL LOW (ref 78.0–100.0)
Platelets: 463 10*3/uL — ABNORMAL HIGH (ref 150–400)
RBC: 4.74 MIL/uL (ref 3.87–5.11)
RDW: 15.9 % — ABNORMAL HIGH (ref 11.5–15.5)
WBC: 12.5 10*3/uL — ABNORMAL HIGH (ref 4.0–10.5)

## 2011-04-08 LAB — URINALYSIS, ROUTINE W REFLEX MICROSCOPIC
Glucose, UA: NEGATIVE mg/dL
Leukocytes, UA: NEGATIVE
Protein, ur: NEGATIVE mg/dL
Specific Gravity, Urine: 1.02 (ref 1.005–1.030)
pH: 6 (ref 5.0–8.0)

## 2011-04-08 LAB — URINE MICROSCOPIC-ADD ON

## 2011-04-08 LAB — WET PREP, GENITAL
Clue Cells Wet Prep HPF POC: NONE SEEN
Yeast Wet Prep HPF POC: NONE SEEN

## 2011-04-08 LAB — HCG, QUANTITATIVE, PREGNANCY: hCG, Beta Chain, Quant, S: 3898 m[IU]/mL — ABNORMAL HIGH (ref <5)

## 2011-04-08 NOTE — Progress Notes (Signed)
Patient presents with c/o abdominal cramping and vaginal bleeding which started on Wednesday, positive UPT on Tuesday next day bleed, some more bleeding on Friday, no bleeding today still having cramping.

## 2011-04-08 NOTE — ED Provider Notes (Signed)
History   35 yo G3P2002 at 6 6/7 weeks by normal LMP presented unannounced reporting + UPT on Tuesday, bleeding, and cramping since Wednesday of this week.  Is on multiple meds (see list) for panic attacks, fibromyalgia, and depression.  Stopped Paxil on advice of Dr. Stefano Gaul.  Has appointment with [facility] 04/13/11.  Bleeding was a small amount initially, then resolved yesterday.  Pain is 6/10 per patient report, but appears in NAD.  Hx remarkable for: Previous C/S x 2 (last delivery 16 yrs ago) Fibromyalgia Panic/depression Recent evaluation for sleep apnea--awaiting diagnosis Chronic hypertension--no current meds Latex and morphine allergies Morbid obesity Chronic anemia--q 3 months iron infusion at [facility] (Dr. Dalene Carrow (spelling))  Chief Complaint  Patient presents with  . Abdominal Cramping  . Vaginal Bleeding     OB History    Grav Para Term Preterm Abortions TAB SAB Ect Mult Living   2 2 2       2       Past Medical History  Diagnosis Date  . Fibromyalgia   . Hypertension   . Migraine   . Anxiety   . Depression   . Abnormal Pap smear   Chronic anemia--receives IV iron q 3 months at [facility]  Past Surgical History  Procedure Date  . Cesarean section   . Wisdom tooth extraction   . Mandible surgery   . Carpal tunnel release     No family history on file.  History  Substance Use Topics  . Smoking status: Never Smoker   . Smokeless tobacco: Never Used  . Alcohol Use: No    Allergies:  Allergies  Allergen Reactions  . Latex Itching and Swelling    Blisters  . Morphine And Related Itching    Prescriptions prior to admission  Medication Sig Dispense Refill  . amitriptyline (ELAVIL) 100 MG tablet Take 100 mg by mouth at bedtime.       . bethanechol (URECHOLINE) 5 MG tablet Take 5 mg by mouth daily as needed. For urinary retention      . clonazePAM (KLONOPIN) 0.5 MG tablet Take 0.5 mg by mouth 3 (three) times daily.       . cyclobenzaprine  (FLEXERIL) 10 MG tablet Take 10 mg by mouth 3 (three) times daily.       [name] FLUoxetine (PROZAC) 20 MG capsule Take 40 mg by mouth daily.        . naproxen (NAPROSYN) 500 MG tablet Take 500 mg by mouth 2 (two) times daily with a meal.       . ondansetron (ZOFRAN-ODT) 4 MG disintegrating tablet Take 4 mg by mouth as needed. For nausea       . oxyCODONE-acetaminophen (PERCOCET) 5-325 MG per tablet Take 1 tablet by mouth 3 (three) times daily.        . pregabalin (LYRICA) 150 MG capsule Take 150 mg by mouth 2 (two) times daily.       . rizatriptan (MAXALT-MLT) 10 MG disintegrating tablet Take 10 mg by mouth as needed. May repeat in 2 hours if needed, for migrains      . triamterene-hydrochlorothiazide (DYAZIDE) 37.5-25 MG per capsule Take 1 capsule by mouth every morning.           Physical Exam   Blood pressure 135/64, pulse 118, temperature 98.7 F (37.1 C), temperature source Oral, resp. rate 16, height 5\' 1"  (1.549 m), weight 129.275 kg (285 lb), last menstrual period 02/19/2011.  Chest clear Heart RRR without murmur Abd--pendulous, NT  Pelvic--No blood in vault.  Small amount white vaginal d/c noted.  Cervix closed, long, NT.  Difficult to assess uterus due to body habitus, but NT.  No adenexal tenderness or masses palpated. Ext WNL,  + UPT here  ED Course  1st trimester bleeding  Plan: Check QHCG, CBC. GC, chlamydia, wet prep done. [place] if QHCG > or = 1500.  Nigel Bridgeman, CNM, [place] 04/08/11 10:45a   Addendum: QHCG 3898 Hgb 11.1  Will order [place] for viability.  Nigel Bridgeman, CNM, [place]  03/28/11 10:50am  Addendum: Returned from [place]:  5w 4d IUGS, with ? Early fetal pole of 3 mm.  No FHT detected at this time.  No SCH. +YS.                                  R ovary WNL.  L ovary not visualized.  No bleeding.  D/C'd home with bleeding precautions. Check QHCG on Wednesday in office--if increasing, plan [place] with visit already scheduled on Friday, 04/13/11.  Nigel Bridgeman, CNM,  [place] 04/08/11  12:50am

## 2011-04-08 NOTE — Procedures (Signed)
NAMEDIERDRA, SALAMEH             ACCOUNT NO.:  [id number]  MEDICAL RECORD NO.:  [id number]          PATIENT TYPE:  OUT  LOCATION:  SLEEP CENTER                 FACILITY[facility]  [facility]  PHYSICIAN:  Annalis Kaczmarczyk D. Maple Hudson, MD, FCCP, FACPDATE OF BIRTH:  1974-08-04  DATE OF STUDY:  03/27/2011                           NOCTURNAL POLYSOMNOGRAM  REFERRING PHYSICIAN:  KWADWO GYARTENG-DAKWA  INDICATION FOR STUDY:  Hypersomnia with sleep apnea.  EPWORTH SLEEPINESS SCORE:  16/24, BMI 53, weight 290 pounds, height 62, neck 15.5 inches.  HOME MEDICATION:  Charted and reviewed.  SLEEP ARCHITECTURE:  Total sleep time 352.5 minutes with sleep efficiency 97.4%.  Stage I was 12.6%, stage II 78.2%, stage III absent, REM 9.2% of total sleep time.  Sleep latency 1 minute, REM latency 218 minutes, awake after sleep onset 8.5 minutes, arousal index was 12.6. Bedtime medication: Lyrica, naproxen, amitriptyline, clonazepam.  RESPIRATORY DATA:  Apnea/hypopnea index (AHI) 10.9 per hour.  A total of 64 events were scored including 1 obstructive apnea and 63 hypopneas. Events were recorded as non-supine.  REM AHI 81.2 per hour.  This is a diagnostic NPSG protocol as ordered.  OXYGEN DATA:  Moderately loud snoring with oxygen desaturation to a nadir of 73% and a mean oxygen saturation through the study of 94.8% on room air.  CARDIAC DATA:  Sinus rhythm.  MOVEMENT/PARASOMNIA:  A few limb jerks were noted with insignificant effect on sleep.  No bathroom trips.  IMPRESSION/RECOMMENDATION: 1. Mild obstructive sleep apnea/hypopnea syndrome, AHI 10.9 per hour     with non-supine events, moderately loud snoring at oxygen     desaturation to a nadir of 73% on room air. 2. This was a diagnostic NPSG protocol as ordered.  CPAP evaluation     was not requested.  Conservative measures can include weight loss,     encouragement to sleep off flat of back, evaluation for significant     nasal or     upper airway obstruction.   If CPAP is a therapeutic consideration,     then she can return for diagnostic CPAP titration study if     appropriate.     Giuliano Preece D. Maple Hudson, MD, [facility], FACP Diplomate, Biomedical engineer of Sleep Medicine Electronically Signed    CDY/MEDQ  D:  04/07/2011 09:42:11  T:  04/08/2011 03:11:46  Job:  [id number]

## 2011-04-09 LAB — GC/CHLAMYDIA PROBE AMP, GENITAL
Chlamydia, DNA Probe: NEGATIVE
GC Probe Amp, Genital: NEGATIVE

## 2011-04-13 ENCOUNTER — Telehealth: Payer: Self-pay | Admitting: Hematology and Oncology

## 2011-04-13 NOTE — Telephone Encounter (Signed)
S/w the pt and she is aware of her lab and md appt  For jan 2013

## 2011-04-24 DIAGNOSIS — O09529 Supervision of elderly multigravida, unspecified trimester: Secondary | ICD-10-CM | POA: Insufficient documentation

## 2011-04-24 DIAGNOSIS — M47816 Spondylosis without myelopathy or radiculopathy, lumbar region: Secondary | ICD-10-CM | POA: Insufficient documentation

## 2011-05-03 ENCOUNTER — Other Ambulatory Visit: Payer: Medicaid Other | Admitting: Lab

## 2011-05-03 ENCOUNTER — Other Ambulatory Visit (HOSPITAL_COMMUNITY): Payer: Self-pay | Admitting: Obstetrics and Gynecology

## 2011-05-03 ENCOUNTER — Ambulatory Visit (HOSPITAL_BASED_OUTPATIENT_CLINIC_OR_DEPARTMENT_OTHER): Payer: Medicaid Other | Attending: Anesthesiology

## 2011-05-04 ENCOUNTER — Other Ambulatory Visit: Payer: Medicaid Other | Admitting: Lab

## 2011-05-06 ENCOUNTER — Ambulatory Visit (HOSPITAL_BASED_OUTPATIENT_CLINIC_OR_DEPARTMENT_OTHER): Payer: Medicaid Other | Attending: Anesthesiology | Admitting: General Practice

## 2011-05-06 VITALS — Ht 62.0 in | Wt 290.0 lb

## 2011-05-06 DIAGNOSIS — G4710 Hypersomnia, unspecified: Secondary | ICD-10-CM | POA: Insufficient documentation

## 2011-05-06 DIAGNOSIS — G4730 Sleep apnea, unspecified: Secondary | ICD-10-CM | POA: Insufficient documentation

## 2011-05-06 DIAGNOSIS — Z9989 Dependence on other enabling machines and devices: Secondary | ICD-10-CM

## 2011-05-07 ENCOUNTER — Ambulatory Visit: Payer: Medicaid Other | Admitting: Lab

## 2011-05-07 LAB — BASIC METABOLIC PANEL
CO2: 23 mEq/L (ref 19–32)
Glucose, Bld: 109 mg/dL — ABNORMAL HIGH (ref 70–99)
Potassium: 4.2 mEq/L (ref 3.5–5.3)
Sodium: 134 mEq/L — ABNORMAL LOW (ref 135–145)

## 2011-05-07 LAB — IRON AND TIBC: %SAT: 3 % — ABNORMAL LOW (ref 20–55)

## 2011-05-07 LAB — CBC WITH DIFFERENTIAL/PLATELET
Basophils Absolute: 0 10*3/uL (ref 0.0–0.1)
Eosinophils Absolute: 0.1 10*3/uL (ref 0.0–0.5)
HGB: 12.1 g/dL (ref 11.6–15.9)
MONO#: 0.6 10*3/uL (ref 0.1–0.9)
NEUT#: 11.9 10*3/uL — ABNORMAL HIGH (ref 1.5–6.5)
Platelets: 500 10*3/uL — ABNORMAL HIGH (ref 145–400)
RBC: 5.2 10*6/uL (ref 3.70–5.45)
RDW: 17 % — ABNORMAL HIGH (ref 11.2–14.5)
WBC: 14.2 10*3/uL — ABNORMAL HIGH (ref 3.9–10.3)

## 2011-05-07 LAB — FERRITIN: Ferritin: 176 ng/mL (ref 10–291)

## 2011-05-08 ENCOUNTER — Encounter: Payer: Self-pay | Admitting: *Deleted

## 2011-05-08 ENCOUNTER — Ambulatory Visit: Payer: Medicaid Other | Admitting: Hematology and Oncology

## 2011-05-09 ENCOUNTER — Ambulatory Visit: Payer: Medicaid Other | Admitting: Hematology and Oncology

## 2011-05-09 ENCOUNTER — Telehealth: Payer: Self-pay | Admitting: *Deleted

## 2011-05-09 NOTE — Telephone Encounter (Signed)
Patient called asking if she needs to come in today at 3:30 pm.  Reports her sister is having emergency surgery today.  "I usually receive a call about my labs and get scheduled for an appt."  Asked that she keep the appt. As scheduled to avoid delay trying to reschedule.

## 2011-05-12 ENCOUNTER — Inpatient Hospital Stay (HOSPITAL_COMMUNITY)
Admission: AD | Admit: 2011-05-12 | Discharge: 2011-05-12 | Disposition: A | Discharge status: Home / Self Care | Payer: Medicaid Other | Source: Ambulatory Visit | Attending: Obstetrics and Gynecology | Admitting: Obstetrics and Gynecology

## 2011-05-12 ENCOUNTER — Encounter (HOSPITAL_COMMUNITY): Payer: Self-pay

## 2011-05-12 ENCOUNTER — Other Ambulatory Visit: Payer: Self-pay

## 2011-05-12 DIAGNOSIS — R0602 Shortness of breath: Secondary | ICD-10-CM | POA: Insufficient documentation

## 2011-05-12 DIAGNOSIS — O99891 Other specified diseases and conditions complicating pregnancy: Secondary | ICD-10-CM | POA: Insufficient documentation

## 2011-05-12 NOTE — ED Provider Notes (Signed)
History     Chief Complaint  Patient presents with  . Shortness of Breath  . Vaginal Bleeding   Shortness of Breath This is a recurrent problem. The current episode started in the past 7 days. The problem occurs constantly. The problem has been unchanged. Associated symptoms include orthopnea. The symptoms are aggravated by any activity. Risk factors: Pregnancy. She has tried nothing for the symptoms.  Vaginal Bleeding  Pt presents with c/o of increased shortness of breath and also c/o vaginal spotting which has resolved today.  Pt reports increased SOB with activity over the past week as well as fatigue.  She also reports that she is unable to sleep lying flat and has difficulty with activities such as stair climbing.  She denies any chest pain or palpitatons.  She denies any viral or URI symptoms.  She reports some increased anxiety due to nurse at hematology/oncology reporting to her that her labs done 05/07/11 are "out of whack".   She also reports that she has contd to experience light vaginal spotting, with last episode being approximately 1wk ago.  Pt has had ultrasound at [facility] which confirmed IUP and established EDC of 12/08/11.  She denies any cramping or abd pain.  She does report nausea and vomiting which has improved at present.  She states she has d/ced all her depression, anxiety and fibromyalgia medications.    OB History    Grav Para Term Preterm Abortions TAB SAB Ect Mult Living   3 2 2       2       Past Medical History  Diagnosis Date  . Hypertension   . Migraine   . Anxiety   . Depression   . Abnormal Pap smear   . Anemia   . Fibroids   . Fibromyalgia     degenerative disc disease    Past Surgical History  Procedure Date  . Wisdom tooth extraction   . Mandible surgery   . Carpal tunnel release   . Cesarean section     1994, 1996     Family History  Problem Relation Age of Onset  . Sickle cell trait Maternal Aunt   . Sickle cell anemia Other     History   Substance Use Topics  . Smoking status: Never Smoker   . Smokeless tobacco: Never Used  . Alcohol Use: No    Allergies:  Allergies  Allergen Reactions  . Latex Itching and Swelling    Blisters  . Morphine And Related Itching    Prescriptions prior to admission  Medication Sig Dispense Refill  . cyclobenzaprine (FLEXERIL) 10 MG tablet Take 10 mg by mouth 3 (three) times daily.       . diphenhydramine-acetaminophen (TYLENOL PM) 25-500 MG TABS Take 1 tablet by mouth at bedtime as needed. Takes for sleep      . Multiple Vitamins-Minerals (MULTI-VITAMIN GUMMIES) CHEW Chew 1 tablet by mouth daily.      . ondansetron (ZOFRAN-ODT) 4 MG disintegrating tablet Take 4 mg by mouth as needed. For nausea       . oxyCODONE-acetaminophen (PERCOCET) 5-325 MG per tablet Take 1 tablet by mouth every 4 (four) hours as needed. Takes for  pain      . pregabalin (LYRICA) 150 MG capsule Take 150 mg by mouth 2 (two) times daily.       [name] triamterene-hydrochlorothiazide (DYAZIDE) 37.5-25 MG per capsule Take 1 capsule by mouth every morning.        [name] amitriptyline (ELAVIL)  100 MG tablet Take 100 mg by mouth at bedtime.       . bethanechol (URECHOLINE) 5 MG tablet Take 5 mg by mouth daily as needed. For urinary retention      . clonazePAM (KLONOPIN) 0.5 MG tablet Take 0.5 mg by mouth 3 (three) times daily.       [name] FLUoxetine (PROZAC) 20 MG capsule Take 40 mg by mouth daily.        . naproxen (NAPROSYN) 500 MG tablet Take 500 mg by mouth 2 (two) times daily with a meal.       . rizatriptan (MAXALT-MLT) 10 MG disintegrating tablet Take 10 mg by mouth as needed. May repeat in 2 hours if needed, for migrains        Review of Systems  Constitutional: Negative.   HENT: Negative.   Eyes: Negative.   Respiratory: Positive for shortness of breath.   Cardiovascular: Positive for orthopnea.  Gastrointestinal: Negative.   Genitourinary: Negative.   Musculoskeletal: Negative.   Skin: Negative.   Endo/Heme/Allergies:  Negative.   Psychiatric/Behavioral: Negative.    Physical Exam   Blood pressure 133/80, pulse 108, resp. rate 18, height 5\' 2"  (1.575 m), weight 125.102 kg (275 lb 12.8 oz), last menstrual period 02/19/2011, SpO2 99.00%.  Physical Exam  Constitutional: She is oriented to person, place, and time. She appears well-developed and well-nourished.  HENT:  Head: Normocephalic and atraumatic.  Right Ear: External ear normal.  Left Ear: External ear normal.  Nose: Nose normal.  Eyes: Conjunctivae are normal. Pupils are equal, round, and reactive to light.  Neck: Normal range of motion. Neck supple. No thyromegaly present.  Cardiovascular: Normal rate, regular rhythm and normal heart sounds.   Respiratory: Effort normal and breath sounds normal.  GI: Soft. Bowel sounds are normal.  Genitourinary:       Deferred  Musculoskeletal: Normal range of motion.  Neurological: She is alert and oriented to person, place, and time. She has normal reflexes.  Skin: Skin is warm and dry.  Psychiatric: She has a normal mood and affect. Her behavior is normal.    MAU Course  Procedures EKG  Assessment and Plan  Pregnancy at 10.0 weeks Shortness of breath Hx anemia, chronic hypertenison, sleep apnea, fibromyalgia, anxiety, depression  Consult obtained with Dr. Stefano Gaul Will obtain 12-lead EKG Dr. Stefano Gaul to see pt.    Eliav Mechling O. 05/12/2011, 12:06 PM

## 2011-05-12 NOTE — Progress Notes (Signed)
36 y.o. year old female,at [redacted]w[redacted]d gestation.  SUBJECTIVE:  The patient complains of shortness of breath. She has no other symptoms. She has an anxiety disorder.  OBJECTIVE:  BP 133/80  Pulse 108  Resp 18  Ht 5\' 2"  (1.575 m)  Wt 125.102 kg (275 lb 12.8 oz)  BMI 50.44 kg/m2  SpO2 99%  LMP 02/19/2011  Fetal Heart Tones:  Contractions:            Chest: Clear  Heart: Regular rate and rhythm  Abdomen: Nontender  EKG: Normal sinus rhythm, no abnormalities noted by my evaluation  Laboratory values:  CBC    Component Value Date/Time   WBC 14.2* 05/07/2011 1201   WBC 12.5* 04/08/2011 0957   RBC 5.20 05/07/2011 1201   RBC 4.74 04/08/2011 0957   HGB 12.1 05/07/2011 1201   HGB 11.1* 04/08/2011 0957   HCT 37.4 05/07/2011 1201   HCT 35.6* 04/08/2011 0957   PLT 500* 05/07/2011 1201   PLT 463* 04/08/2011 0957   MCV 72.1* 05/07/2011 1201   MCV 75.1* 04/08/2011 0957   MCH 23.3* 05/07/2011 1201   MCH 23.4* 04/08/2011 0957   MCHC 32.3 05/07/2011 1201   MCHC 31.2 04/08/2011 0957   RDW 17.0* 05/07/2011 1201   RDW 15.9* 04/08/2011 0957   LYMPHSABS 1.6 05/07/2011 1201   LYMPHSABS 1.8 02/16/2011 1825   MONOABS 0.6 05/07/2011 1201   MONOABS 0.6 02/16/2011 1825   EOSABS 0.1 05/07/2011 1201   EOSABS 0.3 02/16/2011 1825   BASOSABS 0.0 05/07/2011 1201   BASOSABS 0.0 02/16/2011 1825    CMP     Component Value Date/Time   NA 134* 05/07/2011 1201   K 4.2 05/07/2011 1201   CL 98 05/07/2011 1201   CO2 23 05/07/2011 1201   GLUCOSE 109* 05/07/2011 1201   BUN 9 05/07/2011 1201   CREATININE 0.72 05/07/2011 1201   CALCIUM 10.3 05/07/2011 1201   PROT 7.9 10/24/2010 1449   ALBUMIN 4.1 10/24/2010 1449   AST 29 10/24/2010 1449   ALT 29 10/24/2010 1449   ALKPHOS 86 10/24/2010 1449   BILITOT 0.2* 10/24/2010 1449   GFRNONAA 74* 02/16/2011 1825   GFRAA 86* 02/16/2011 1825       ASSESSMENT:  [redacted]w[redacted]d Weeks Pregnancy  SOB of unknown etiology  Hypertension  Diabetes  Obesity  Anxiety  PLAN:  The patient  was offered evaluation at [facility] emergency department. She declines. She agrees to return to the [facility] emergency room if symptoms continue or worsen.  We will have the anesthesiologist review the EKG.   Leonard Schwartz, M.D.

## 2011-05-12 NOTE — Progress Notes (Signed)
Had sleep study Sunday and [facility], has been having shortness of breath high risk because of anemia, had light bleeding off and on for about one week, none today

## 2011-05-13 DIAGNOSIS — G4710 Hypersomnia, unspecified: Secondary | ICD-10-CM

## 2011-05-13 DIAGNOSIS — G4730 Sleep apnea, unspecified: Secondary | ICD-10-CM

## 2011-05-13 NOTE — Procedures (Signed)
Cheyenne Arias, Cheyenne Arias             ACCOUNT NO.:  [id number]  MEDICAL RECORD NO.:  [id number]          PATIENT TYPE:  OUT  LOCATION:  SLEEP CENTER                 FACILITY:  [facility]  PHYSICIAN:  Emerita Berkemeier D. Maple Hudson, MD, FCCP, FACPDATE OF BIRTH:  08/15/74  DATE OF STUDY:  05/06/2011                           NOCTURNAL POLYSOMNOGRAM  REFERRING PHYSICIAN:  Tonye Royalty, MD  REFERRING PHYSICIAN:  Tonye Royalty, MD  INDICATION FOR STUDY:  Hypersomnia with sleep apnea.  EPWORTH SLEEPINESS SCORE:  16/24.  BMI 53, weight 290 pounds, height 62 inches, neck 15.5 inches.  MEDICATIONS:  Home medications are charted and reviewed.  A baseline diagnostic NPSG on March 27, 2011, gave AHI 10.9 per hour.  CPAP titration is requested.  SLEEP ARCHITECTURE:  Total sleep time 336.5 minutes with sleep efficiency 84.3%.  Stage I was 5.2%, stage II 83.5%, stage III absent, REM 11.3% of total sleep time.  Sleep latency 42.5 minutes, REM latency 131.5 minutes, awake after sleep onset 20.5 minutes.  Arousal index 8.2.  Bedtime medication:  None.  RESPIRATORY DATA:  CPAP titration protocol.  CPAP was titrated to 10 CWP, AHI 0.8 per hour.  She wore a Banker Pilairo mask (1 size) with heated humidifier and a C-Flex setting of 3.  OXYGEN DATA:  Moderate snoring initially, but with CPAP control mean oxygen saturation held 96.2% on room air and snoring was prevented.  CARDIAC DATA:  Sinus rhythm.  MOVEMENT-PARASOMNIA:  A few incidental limb jerks were noted with little effect on sleep.  Bathroom x1.  IMPRESSIONS-RECOMMENDATIONS: 1. Successful continuous positive airway pressure titration to 10 cm     of water pressure, apnea/hypopnea index 0.8 per hour.  She wore a     Banker Pilairo mask (1 size) with heated humidifier and     a C-Flex setting of three. 2. Baseline diagnostic nocturnal polysomnogram on March 27, 2011,     had recorded an apnea/hypopnea index of  10.9 per hour.     Cheyenne Arias D. Maple Hudson, MD, [facility], FACP Diplomate, Biomedical engineer of Sleep Medicine Electronically Signed    CDY/MEDQ  D:  05/13/2011 09:31:50  T:  05/13/2011 12:01:57  Job:  [id number]

## 2011-05-14 DIAGNOSIS — O209 Hemorrhage in early pregnancy, unspecified: Secondary | ICD-10-CM | POA: Insufficient documentation

## 2011-05-18 ENCOUNTER — Institutional Professional Consult (permissible substitution): Payer: Medicaid Other | Admitting: Internal Medicine

## 2011-05-19 ENCOUNTER — Encounter: Payer: Self-pay | Admitting: Obstetrics and Gynecology

## 2011-05-19 DIAGNOSIS — R339 Retention of urine, unspecified: Secondary | ICD-10-CM

## 2011-05-19 DIAGNOSIS — E669 Obesity, unspecified: Secondary | ICD-10-CM

## 2011-05-19 DIAGNOSIS — O09529 Supervision of elderly multigravida, unspecified trimester: Secondary | ICD-10-CM

## 2011-05-19 DIAGNOSIS — Z8742 Personal history of other diseases of the female genital tract: Secondary | ICD-10-CM

## 2011-05-19 DIAGNOSIS — D219 Benign neoplasm of connective and other soft tissue, unspecified: Secondary | ICD-10-CM

## 2011-05-19 DIAGNOSIS — M797 Fibromyalgia: Secondary | ICD-10-CM

## 2011-05-19 DIAGNOSIS — M545 Low back pain: Secondary | ICD-10-CM

## 2011-05-19 DIAGNOSIS — O209 Hemorrhage in early pregnancy, unspecified: Secondary | ICD-10-CM

## 2011-05-19 DIAGNOSIS — J069 Acute upper respiratory infection, unspecified: Secondary | ICD-10-CM

## 2011-05-19 DIAGNOSIS — N920 Excessive and frequent menstruation with regular cycle: Secondary | ICD-10-CM

## 2011-05-19 DIAGNOSIS — N8189 Other female genital prolapse: Secondary | ICD-10-CM

## 2011-05-22 LAB — ABO/RH: RH Type: POSITIVE

## 2011-05-22 LAB — ANTIBODY SCREEN: Antibody Screen: NEGATIVE

## 2011-06-01 ENCOUNTER — Encounter (HOSPITAL_BASED_OUTPATIENT_CLINIC_OR_DEPARTMENT_OTHER): Payer: Medicaid Other

## 2011-06-13 ENCOUNTER — Other Ambulatory Visit (HOSPITAL_COMMUNITY)
Admission: RE | Admit: 2011-06-13 | Discharge: 2011-06-13 | Disposition: A | Discharge status: Home / Self Care | Payer: Medicaid Other | Source: Ambulatory Visit | Attending: Obstetrics and Gynecology | Admitting: Obstetrics and Gynecology

## 2011-06-13 ENCOUNTER — Ambulatory Visit (INDEPENDENT_AMBULATORY_CARE_PROVIDER_SITE_OTHER): Payer: Medicaid Other | Admitting: Obstetrics and Gynecology

## 2011-06-13 VITALS — BP 123/76 | Temp 98.0°F | Wt 275.6 lb

## 2011-06-13 DIAGNOSIS — Z113 Encounter for screening for infections with a predominantly sexual mode of transmission: Secondary | ICD-10-CM | POA: Insufficient documentation

## 2011-06-13 DIAGNOSIS — O09529 Supervision of elderly multigravida, unspecified trimester: Secondary | ICD-10-CM

## 2011-06-13 DIAGNOSIS — O169 Unspecified maternal hypertension, unspecified trimester: Secondary | ICD-10-CM

## 2011-06-13 DIAGNOSIS — R87810 Cervical high risk human papillomavirus (HPV) DNA test positive: Secondary | ICD-10-CM | POA: Insufficient documentation

## 2011-06-13 DIAGNOSIS — Z01419 Encounter for gynecological examination (general) (routine) without abnormal findings: Secondary | ICD-10-CM | POA: Insufficient documentation

## 2011-06-13 DIAGNOSIS — D649 Anemia, unspecified: Secondary | ICD-10-CM

## 2011-06-13 LAB — POCT URINALYSIS DIP (DEVICE)
Bilirubin Urine: NEGATIVE
Glucose, UA: NEGATIVE mg/dL
Hgb urine dipstick: NEGATIVE
Specific Gravity, Urine: 1.015 (ref 1.005–1.030)
pH: 7.5 (ref 5.0–8.0)

## 2011-06-13 MED ORDER — LABETALOL HCL 100 MG PO TABS: 100.0000 mg | ORAL_TABLET | Freq: Two times a day (BID) | ORAL | Status: DC

## 2011-06-13 NOTE — Patient Instructions (Signed)
It was nice to meet you today.  We are changing your BP medication to one that is better tolerated during pregnancy.  A prescription for Labetalol is waiting for you at [facility] on [address].  Please follow up in 1 week for a recheck of your BP.    Pregnancy - Second Trimester The second trimester of pregnancy (3 to 6 months) is a period of rapid growth for you and your baby. At the end of the sixth month, your baby is about 9 inches long and weighs 1 1/2 pounds. You will begin to feel the baby move between 18 and 20 weeks of the pregnancy. This is called quickening. Weight gain is faster. A clear fluid (colostrum) may leak out of your breasts. You may feel small contractions of the womb (uterus). This is known as false labor or Braxton-Hicks contractions. This is like a practice for labor when the baby is ready to be born. Usually, the problems with morning sickness have usually passed by the end of your first trimester. Some women develop small dark blotches (called cholasma, mask of pregnancy) on their face that usually goes away after the baby is born. Exposure to the sun makes the blotches worse. Acne may also develop in some pregnant women and pregnant women who have acne, may find that it goes away. PRENATAL EXAMS  Blood work may continue to be done during prenatal exams. These tests are done to check on your health and the probable health of your baby. Blood work is used to follow your blood levels (hemoglobin). Anemia (low hemoglobin) is common during pregnancy. Iron and vitamins are given to help prevent this. You will also be checked for diabetes between 24 and 28 weeks of the pregnancy. Some of the previous blood tests may be repeated.     The size of the uterus is measured during each visit. This is to make sure that the baby is continuing to grow properly according to the dates of the pregnancy.     Your blood pressure is checked every prenatal visit. This is to make sure you are not getting  toxemia.     Your urine is checked to make sure you do not have an infection, diabetes or protein in the urine.     Your weight is checked often to make sure gains are happening at the suggested rate. This is to ensure that both you and your baby are growing normally.     Sometimes, an ultrasound is performed to confirm the proper growth and development of the baby. This is a test which bounces harmless sound waves off the baby so your caregiver can more accurately determine due dates.  Sometimes, a specialized test is done on the amniotic fluid surrounding the baby. This test is called an amniocentesis. The amniotic fluid is obtained by sticking a needle into the belly (abdomen). This is done to check the chromosomes in instances where there is a concern about possible genetic problems with the baby. It is also sometimes done near the end of pregnancy if an early delivery is required. In this case, it is done to help make sure the baby's lungs are mature enough for the baby to live outside of the womb. CHANGES OCCURING IN THE SECOND TRIMESTER OF PREGNANCY Your body goes through many changes during pregnancy. They vary from person to person. Talk to your caregiver about changes you notice that you are concerned about.  During the second trimester, you will likely have an  increase in your appetite. It is normal to have cravings for certain foods. This varies from person to person and pregnancy to pregnancy.     Your lower abdomen will begin to bulge.     You may have to urinate more often because the uterus and baby are pressing on your bladder. It is also common to get more bladder infections during pregnancy (pain with urination). You can help this by drinking lots of fluids and emptying your bladder before and after intercourse.     You may begin to get stretch marks on your hips, abdomen, and breasts. These are normal changes in the body during pregnancy. There are no exercises or medications to  take that prevent this change.     You may begin to develop swollen and bulging veins (varicose veins) in your legs. Wearing support hose, elevating your feet for 15 minutes, 3 to 4 times a day and limiting salt in your diet helps lessen the problem.     Heartburn may develop as the uterus grows and pushes up against the stomach. Antacids recommended by your caregiver helps with this problem. Also, eating smaller meals 4 to 5 times a day helps.     Constipation can be treated with a stool softener or adding bulk to your diet. Drinking lots of fluids, vegetables, fruits, and whole grains are helpful.     Exercising is also helpful. If you have been very active up until your pregnancy, most of these activities can be continued during your pregnancy. If you have been less active, it is helpful to start an exercise program such as walking.     Hemorrhoids (varicose veins in the rectum) may develop at the end of the second trimester. Warm sitz baths and hemorrhoid cream recommended by your caregiver helps hemorrhoid problems.     Backaches may develop during this time of your pregnancy. Avoid heavy lifting, wear low heal shoes and practice good posture to help with backache problems.     Some pregnant women develop tingling and numbness of their hand and fingers because of swelling and tightening of ligaments in the wrist (carpel tunnel syndrome). This goes away after the baby is born.     As your breasts enlarge, you may have to get a bigger bra. Get a comfortable, cotton, support bra. Do not get a nursing bra until the last month of the pregnancy if you will be nursing the baby.     You may get a dark line from your belly button to the pubic area called the linea nigra.     You may develop rosy cheeks because of increase blood flow to the face.     You may develop spider looking lines of the face, neck, arms and chest. These go away after the baby is born.  HOME CARE INSTRUCTIONS    It is  extremely important to avoid all smoking, herbs, alcohol, and unprescribed drugs during your pregnancy. These chemicals affect the formation and growth of the baby. Avoid these chemicals throughout the pregnancy to ensure the delivery of a healthy infant.     Most of your home care instructions are the same as suggested for the first trimester of your pregnancy. Keep your caregiver's appointments. Follow your caregiver's instructions regarding medication use, exercise and diet.     During pregnancy, you are providing food for you and your baby. Continue to eat regular, well-balanced meals. Choose foods such as meat, fish, milk and other low fat dairy  products, vegetables, fruits, and whole-grain breads and cereals. Your caregiver will tell you of the ideal weight gain.     A physical sexual relationship may be continued up until near the end of pregnancy if there are no other problems. Problems could include early (premature) leaking of amniotic fluid from the membranes, vaginal bleeding, abdominal pain, or other medical or pregnancy problems.     Exercise regularly if there are no restrictions. Check with your caregiver if you are unsure of the safety of some of your exercises. The greatest weight gain will occur in the last 2 trimesters of pregnancy. Exercise will help you:     Control your weight.     Get you in shape for labor and delivery.     Lose weight after you have the baby.     Wear a good support or jogging bra for breast tenderness during pregnancy. This may help if worn during sleep. Pads or tissues may be used in the bra if you are leaking colostrum.     Do not use hot tubs, steam rooms or saunas throughout the pregnancy.     Wear your seat belt at all times when driving. This protects you and your baby if you are in an accident.     Avoid raw meat, uncooked cheese, cat litter boxes and soil used by cats. These carry germs that can cause birth defects in the baby.     The  second trimester is also a good time to visit your dentist for your dental health if this has not been done yet. Getting your teeth cleaned is OK. Use a soft toothbrush. Brush gently during pregnancy.     It is easier to loose urine during pregnancy. Tightening up and strengthening the pelvic muscles will help with this problem. Practice stopping your urination while you are going to the bathroom. These are the same muscles you need to strengthen. It is also the muscles you would use as if you were trying to stop from passing gas. You can practice tightening these muscles up 10 times a set and repeating this about 3 times per day. Once you know what muscles to tighten up, do not perform these exercises during urination. It is more likely to contribute to an infection by backing up the urine.     Ask for help if you have financial, counseling or nutritional needs during pregnancy. Your caregiver will be able to offer counseling for these needs as well as refer you for other special needs.     Your skin may become oily. If so, wash your face with mild soap, use non-greasy moisturizer and oil or cream based makeup.  MEDICATIONS AND DRUG USE IN PREGNANCY  Take prenatal vitamins as directed. The vitamin should contain 1 milligram of folic acid. Keep all vitamins out of reach of children. Only a couple vitamins or tablets containing iron may be fatal to a baby or young child when ingested.     Avoid use of all medications, including herbs, over-the-counter medications, not prescribed or suggested by your caregiver. Only take over-the-counter or prescription medicines for pain, discomfort, or fever as directed by your caregiver. Do not use aspirin.     Let your caregiver also know about herbs you may be using.     Alcohol is related to a number of birth defects. This includes fetal alcohol syndrome. All alcohol, in any form, should be avoided completely. Smoking will cause low birth rate and premature  babies.  Street or illegal drugs are very harmful to the baby. They are absolutely forbidden. A baby born to an addicted mother will be addicted at birth. The baby will go through the same withdrawal an adult does.  SEEK MEDICAL CARE IF:   You have any concerns or worries during your pregnancy. It is better to call with your questions if you feel they cannot wait, rather than worry about them. SEEK IMMEDIATE MEDICAL CARE IF:    An unexplained oral temperature above 102 F (38.9 C) develops, or as your caregiver suggests.     You have leaking of fluid from the vagina (birth canal). If leaking membranes are suspected, take your temperature and tell your caregiver of this when you call.     There is vaginal spotting, bleeding, or passing clots. Tell your caregiver of the amount and how many pads are used. Light spotting in pregnancy is common, especially following intercourse.     You develop a bad smelling vaginal discharge with a change in the color from clear to white.     You continue to feel sick to your stomach (nauseated) and have no relief from remedies suggested. You vomit blood or coffee ground-like materials.     You lose more than 2 pounds of weight or gain more than 2 pounds of weight over 1 week, or as suggested by your caregiver.     You notice swelling of your face, hands, feet, or legs.     You get exposed to [place] measles and have never had them.     You are exposed to fifth disease or chickenpox.     You develop belly (abdominal) pain. Round ligament discomfort is a common non-cancerous (benign) cause of abdominal pain in pregnancy. Your caregiver still must evaluate you.     You develop a bad headache that does not go away.     You develop fever, diarrhea, pain with urination, or shortness of breath.     You develop visual problems, blurry, or double vision.     You fall or are in a car accident or any kind of trauma.     There is mental or physical violence  at home.  Document Released: 03/27/2001 Document Revised: 12/13/2010 Document Reviewed: 09/29/2008 [facility] Patient Information 2012 [place], [place].

## 2011-06-13 NOTE — Progress Notes (Signed)
Pulse 104 Mild swelling in legs. 1hr gtt today, lab draw due at 0900.

## 2011-06-13 NOTE — Progress Notes (Signed)
Saw pt and did pelvic with resident. Agree with plans. Quad screen next. F/U [facility] for CHTN. Will add on baseline labs TSH, CMP and needs 24 hr next.

## 2011-06-13 NOTE — Progress Notes (Signed)
Subjective:    Cheyenne Arias is a [id number] [redacted]w[redacted]d being seen today for her first obstetrical visit.  Her obstetrical history is significant for advanced maternal age, obesity and essential hypertension. Patient does not intend to breast feed. Pregnancy history fully reviewed.  Pt reports history of Fibromyalgia, migraine headaches, depression.  Has stopped amitriptyline, Prozac, klonopin, Lyrica, and Maxalt.  Reports that her mood and pain have been adequately controlled at this time.  Requiring percocet 2-3Xs per week.  Flexeril q daily.  Patient reports carpal tunnel symptoms, fatigue and headache.  Filed Vitals:   06/13/11 0759  BP: 123/76  Temp: 98 F (36.7 C)  Weight: 275 lb 9.6 oz (125.011 kg)    HISTORY: OB History    Grav Para Term Preterm Abortions TAB SAB Ect Mult Living   3 2 2       2      # Outc Date GA Lbr Len/2nd Wgt Sex Del Anes PTL Lv   1 TRM 9/94 [redacted]w[redacted]d  7lb8oz(3.402kg) F CS EPI  Yes   2 TRM 4/96 [redacted]w[redacted]d  8lb9oz(3.884kg) M CS EPI  Yes   Comments: rh negative   3 CUR              Past Medical History  Diagnosis Date  . Hypertension   . Migraine   . Anxiety   . Depression   . Abnormal Pap smear   . Anemia   . Fibroids   . Fibromyalgia     degenerative disc disease   Past Surgical History  Procedure Date  . Wisdom tooth extraction   . Mandible surgery   . Carpal tunnel release   . Cesarean section     1994, 1996    Family History  Problem Relation Age of Onset  . Sickle cell trait Maternal Aunt   . Sickle cell anemia Other      Exam    Uterine Size: size equals dates  Pelvic Exam:    Perineum: No Hemorrhoids   Vulva: normal   Vagina:  curdlike discharge   pH:    Cervix: no bleeding following Pap, no lesions and retroverted   Adnexa: normal adnexa and no mass, fullness, tenderness   Bony Pelvis: average  System: Breast:  normal appearance, no masses or tenderness   Skin: normal coloration and turgor, no rashes    Neurologic:  oriented, normal mood   Extremities: no deformities   HEENT extra ocular movement intact, sclera clear, anicteric, neck supple with midline trachea, thyroid without masses and trachea midline   Mouth/Teeth mucous membranes moist, pharynx normal without lesions   Neck supple   Cardiovascular: regular rate and rhythm, no murmurs or gallops   Respiratory:  appears well, vitals normal, no respiratory distress, acyanotic, normal RR, ear and throat exam is normal, neck free of mass or lymphadenopathy, chest clear, no wheezing, crepitations, rhonchi, normal symmetric air entry   Abdomen: soft, non-tender; bowel sounds normal; no masses,  no organomegaly   Urinary: urethral meatus normal      Assessment:    Pregnancy: [id number] Patient Active Problem List  Diagnoses  . Bleeding in early pregnancy  . Fibromyalgia  . Low back pain  . AMA (advanced maternal age) multigravida 35+  . Obesity  . Viral URI  . Pelvic relaxation  . History of urinary retention  . Hx of dysmenorrhea  . Fibroids        Plan:     Initial labs drawn. Prenatal vitamins. Problem list reviewed  and updated. Gestational Diabetes screening - due to 2+ risk factors Genetic Screening discussed Quad Screen: requested.  Ultrasound discussed; fetal survey: requested. Follow up in 2 weeks - BP check and QUAD SCREEN 60% of 45 min visit spent on counseling and coordination of care.    Gaspar Bidding, DO 06/13/2011

## 2011-06-14 LAB — COMPREHENSIVE METABOLIC PANEL
ALT: 13 U/L (ref 0–35)
Albumin: 4.1 g/dL (ref 3.5–5.2)
CO2: 22 mEq/L (ref 19–32)
Calcium: 9.2 mg/dL (ref 8.4–10.5)
Chloride: 102 mEq/L (ref 96–112)
Potassium: 4.1 mEq/L (ref 3.5–5.3)
Sodium: 136 mEq/L (ref 135–145)
Total Protein: 7.1 g/dL (ref 6.0–8.3)

## 2011-06-14 LAB — TSH: TSH: 2.385 u[IU]/mL (ref 0.350–4.500)

## 2011-06-27 ENCOUNTER — Ambulatory Visit (INDEPENDENT_AMBULATORY_CARE_PROVIDER_SITE_OTHER): Payer: Medicaid Other | Admitting: Family Medicine

## 2011-06-27 VITALS — BP 113/65 | Temp 97.5°F | Wt 273.0 lb

## 2011-06-27 DIAGNOSIS — O169 Unspecified maternal hypertension, unspecified trimester: Secondary | ICD-10-CM

## 2011-06-27 DIAGNOSIS — O09529 Supervision of elderly multigravida, unspecified trimester: Secondary | ICD-10-CM

## 2011-06-27 LAB — POCT URINALYSIS DIP (DEVICE)
Glucose, UA: NEGATIVE mg/dL
Hgb urine dipstick: NEGATIVE
Leukocytes, UA: NEGATIVE
Nitrite: NEGATIVE
Urobilinogen, UA: 0.2 mg/dL (ref 0.0–1.0)

## 2011-06-27 NOTE — Progress Notes (Signed)
Patient doing well. States she has been having abdominal pains that feel like tightening. Some pressure. Resolves with rest; does not last. Worsens with stress. Decreased PO intake. Taking blood pressure medications and prenatal vitamins. No vomiting. States she has constipation which has improved some with Miralax, but continues to have abdominal pain. Stressful social situations with son (behavior problems.) Denies depression, SI/HI today. She will need a lot of support during this pregnancy. Patient's urine showed ketones and protein today. No leuks and no symptoms therefore not sent for cultue. Will need 24 hour urine. Takes gummy prenatal vitamins, Labetalol (switched from HCTZ), Flexeril and Percocet. Feels baby move some, no vaginal bleeding, no vaginal discharge.   Patient is following up in [facility] today. She needs follow up in [facility] due to HTN, AMA, other medical history. Patient was also under the impression she would be getting an Integrated Screen today. I discussed with her that this test would have needed to be done prior to 14 weeks. She is very disappointed. Will order quad screen today, and anatomy scan in 2 weeks.   She will follow up with [facility] in 2 weeks. If patient has increased pain, starts having vaginal bleeding, increased discharge or any other concerns she will go to [facility] to be evaluated.

## 2011-06-27 NOTE — Progress Notes (Signed)
OB US scheduled 07/11/11 @ 0930

## 2011-06-27 NOTE — Patient Instructions (Signed)
Pregnancy - Second Trimester The second trimester of pregnancy (3 to 6 months) is a period of rapid growth for you and your baby. At the end of the sixth month, your baby is about 9 inches long and weighs 1 1/2 pounds. You will begin to feel the baby move between 18 and 20 weeks of the pregnancy. This is called quickening. Weight gain is faster. A clear fluid (colostrum) may leak out of your breasts. You may feel small contractions of the womb (uterus). This is known as false labor or Braxton-Hicks contractions. This is like a practice for labor when the baby is ready to be born. Usually, the problems with morning sickness have usually passed by the end of your first trimester. Some women develop small dark blotches (called cholasma, mask of pregnancy) on their face that usually goes away after the baby is born. Exposure to the sun makes the blotches worse. Acne may also develop in some pregnant women and pregnant women who have acne, may find that it goes away. PRENATAL EXAMS  Blood work may continue to be done during prenatal exams. These tests are done to check on your health and the probable health of your baby. Blood work is used to follow your blood levels (hemoglobin). Anemia (low hemoglobin) is common during pregnancy. Iron and vitamins are given to help prevent this. You will also be checked for diabetes between 24 and 28 weeks of the pregnancy. Some of the previous blood tests may be repeated.   The size of the uterus is measured during each visit. This is to make sure that the baby is continuing to grow properly according to the dates of the pregnancy.   Your blood pressure is checked every prenatal visit. This is to make sure you are not getting toxemia.   Your urine is checked to make sure you do not have an infection, diabetes or protein in the urine.   Your weight is checked often to make sure gains are happening at the suggested rate. This is to ensure that both you and your baby are  growing normally.   Sometimes, an ultrasound is performed to confirm the proper growth and development of the baby. This is a test which bounces harmless sound waves off the baby so your caregiver can more accurately determine due dates.  Sometimes, a specialized test is done on the amniotic fluid surrounding the baby. This test is called an amniocentesis. The amniotic fluid is obtained by sticking a needle into the belly (abdomen). This is done to check the chromosomes in instances where there is a concern about possible genetic problems with the baby. It is also sometimes done near the end of pregnancy if an early delivery is required. In this case, it is done to help make sure the baby's lungs are mature enough for the baby to live outside of the womb. CHANGES OCCURING IN THE SECOND TRIMESTER OF PREGNANCY Your body goes through many changes during pregnancy. They vary from person to person. Talk to your caregiver about changes you notice that you are concerned about.  During the second trimester, you will likely have an increase in your appetite. It is normal to have cravings for certain foods. This varies from person to person and pregnancy to pregnancy.   Your lower abdomen will begin to bulge.   You may have to urinate more often because the uterus and baby are pressing on your bladder. It is also common to get more bladder infections during pregnancy (  pain with urination). You can help this by drinking lots of fluids and emptying your bladder before and after intercourse.   You may begin to get stretch marks on your hips, abdomen, and breasts. These are normal changes in the body during pregnancy. There are no exercises or medications to take that prevent this change.   You may begin to develop swollen and bulging veins (varicose veins) in your legs. Wearing support hose, elevating your feet for 15 minutes, 3 to 4 times a day and limiting salt in your diet helps lessen the problem.    Heartburn may develop as the uterus grows and pushes up against the stomach. Antacids recommended by your caregiver helps with this problem. Also, eating smaller meals 4 to 5 times a day helps.   Constipation can be treated with a stool softener or adding bulk to your diet. Drinking lots of fluids, vegetables, fruits, and whole grains are helpful.   Exercising is also helpful. If you have been very active up until your pregnancy, most of these activities can be continued during your pregnancy. If you have been less active, it is helpful to start an exercise program such as walking.   Hemorrhoids (varicose veins in the rectum) may develop at the end of the second trimester. Warm sitz baths and hemorrhoid cream recommended by your caregiver helps hemorrhoid problems.   Backaches may develop during this time of your pregnancy. Avoid heavy lifting, wear low heal shoes and practice good posture to help with backache problems.   Some pregnant women develop tingling and numbness of their hand and fingers because of swelling and tightening of ligaments in the wrist (carpel tunnel syndrome). This goes away after the baby is born.   As your breasts enlarge, you may have to get a bigger bra. Get a comfortable, cotton, support bra. Do not get a nursing bra until the last month of the pregnancy if you will be nursing the baby.   You may get a dark line from your belly button to the pubic area called the linea nigra.   You may develop rosy cheeks because of increase blood flow to the face.   You may develop spider looking lines of the face, neck, arms and chest. These go away after the baby is born.  HOME CARE INSTRUCTIONS   It is extremely important to avoid all smoking, herbs, alcohol, and unprescribed drugs during your pregnancy. These chemicals affect the formation and growth of the baby. Avoid these chemicals throughout the pregnancy to ensure the delivery of a healthy infant.   Most of your home  care instructions are the same as suggested for the first trimester of your pregnancy. Keep your caregiver's appointments. Follow your caregiver's instructions regarding medication use, exercise and diet.   During pregnancy, you are providing food for you and your baby. Continue to eat regular, well-balanced meals. Choose foods such as meat, fish, milk and other low fat dairy products, vegetables, fruits, and whole-grain breads and cereals. Your caregiver will tell you of the ideal weight gain.   A physical sexual relationship may be continued up until near the end of pregnancy if there are no other problems. Problems could include early (premature) leaking of amniotic fluid from the membranes, vaginal bleeding, abdominal pain, or other medical or pregnancy problems.   Exercise regularly if there are no restrictions. Check with your caregiver if you are unsure of the safety of some of your exercises. The greatest weight gain will occur in the   last 2 trimesters of pregnancy. Exercise will help you:   Control your weight.   Get you in shape for labor and delivery.   Lose weight after you have the baby.   Wear a good support or jogging bra for breast tenderness during pregnancy. This may help if worn during sleep. Pads or tissues may be used in the bra if you are leaking colostrum.   Do not use hot tubs, steam rooms or saunas throughout the pregnancy.   Wear your seat belt at all times when driving. This protects you and your baby if you are in an accident.   Avoid raw meat, uncooked cheese, cat litter boxes and soil used by cats. These carry germs that can cause birth defects in the baby.   The second trimester is also a good time to visit your dentist for your dental health if this has not been done yet. Getting your teeth cleaned is OK. Use a soft toothbrush. Brush gently during pregnancy.   It is easier to loose urine during pregnancy. Tightening up and strengthening the pelvic muscles will  help with this problem. Practice stopping your urination while you are going to the bathroom. These are the same muscles you need to strengthen. It is also the muscles you would use as if you were trying to stop from passing gas. You can practice tightening these muscles up 10 times a set and repeating this about 3 times per day. Once you know what muscles to tighten up, do not perform these exercises during urination. It is more likely to contribute to an infection by backing up the urine.   Ask for help if you have financial, counseling or nutritional needs during pregnancy. Your caregiver will be able to offer counseling for these needs as well as refer you for other special needs.   Your skin may become oily. If so, wash your face with mild soap, use non-greasy moisturizer and oil or cream based makeup.  MEDICATIONS AND DRUG USE IN PREGNANCY  Take prenatal vitamins as directed. The vitamin should contain 1 milligram of folic acid. Keep all vitamins out of reach of children. Only a couple vitamins or tablets containing iron may be fatal to a baby or young child when ingested.   Avoid use of all medications, including herbs, over-the-counter medications, not prescribed or suggested by your caregiver. Only take over-the-counter or prescription medicines for pain, discomfort, or fever as directed by your caregiver. Do not use aspirin.   Let your caregiver also know about herbs you may be using.   Alcohol is related to a number of birth defects. This includes fetal alcohol syndrome. All alcohol, in any form, should be avoided completely. Smoking will cause low birth rate and premature babies.   Street or illegal drugs are very harmful to the baby. They are absolutely forbidden. A baby born to an addicted mother will be addicted at birth. The baby will go through the same withdrawal an adult does.  SEEK MEDICAL CARE IF:  You have any concerns or worries during your pregnancy. It is better to call with  your questions if you feel they cannot wait, rather than worry about them. SEEK IMMEDIATE MEDICAL CARE IF:   An unexplained oral temperature above 102 F (38.9 C) develops, or as your caregiver suggests.   You have leaking of fluid from the vagina (birth canal). If leaking membranes are suspected, take your temperature and tell your caregiver of this when you call.   There   is vaginal spotting, bleeding, or passing clots. Tell your caregiver of the amount and how many pads are used. Light spotting in pregnancy is common, especially following intercourse.   You develop a bad smelling vaginal discharge with a change in the color from clear to white.   You continue to feel sick to your stomach (nauseated) and have no relief from remedies suggested. You vomit blood or coffee ground-like materials.   You lose more than 2 pounds of weight or gain more than 2 pounds of weight over 1 week, or as suggested by your caregiver.   You notice swelling of your face, hands, feet, or legs.   You get exposed to German measles and have never had them.   You are exposed to fifth disease or chickenpox.   You develop belly (abdominal) pain. Round ligament discomfort is a common non-cancerous (benign) cause of abdominal pain in pregnancy. Your caregiver still must evaluate you.   You develop a bad headache that does not go away.   You develop fever, diarrhea, pain with urination, or shortness of breath.   You develop visual problems, blurry, or double vision.   You fall or are in a car accident or any kind of trauma.   There is mental or physical violence at home.  Document Released: 03/27/2001 Document Revised: 03/22/2011 Document Reviewed: 09/29/2008 [employer] Patient Information 2012 [employer]. 

## 2011-06-27 NOTE — Progress Notes (Signed)
Pulse- 101  Edema-legs.  Contractions- "stomach tightens, random for the past month"

## 2011-06-28 NOTE — Progress Notes (Signed)
Chart review done.  Agree with resident note.   

## 2011-07-11 ENCOUNTER — Ambulatory Visit (HOSPITAL_COMMUNITY)
Admission: RE | Admit: 2011-07-11 | Discharge: 2011-07-11 | Disposition: A | Discharge status: Home / Self Care | Payer: Medicaid Other | Source: Ambulatory Visit | Attending: Family Medicine | Admitting: Family Medicine

## 2011-07-11 DIAGNOSIS — O3410 Maternal care for benign tumor of corpus uteri, unspecified trimester: Secondary | ICD-10-CM | POA: Insufficient documentation

## 2011-07-11 DIAGNOSIS — O34219 Maternal care for unspecified type scar from previous cesarean delivery: Secondary | ICD-10-CM | POA: Insufficient documentation

## 2011-07-11 DIAGNOSIS — O10019 Pre-existing essential hypertension complicating pregnancy, unspecified trimester: Secondary | ICD-10-CM | POA: Insufficient documentation

## 2011-07-11 DIAGNOSIS — O09529 Supervision of elderly multigravida, unspecified trimester: Secondary | ICD-10-CM | POA: Insufficient documentation

## 2011-07-12 ENCOUNTER — Encounter: Payer: Self-pay | Admitting: Family Medicine

## 2011-07-12 ENCOUNTER — Ambulatory Visit (INDEPENDENT_AMBULATORY_CARE_PROVIDER_SITE_OTHER): Payer: Medicaid Other | Admitting: Family Medicine

## 2011-07-12 VITALS — BP 129/67 | Wt 273.2 lb

## 2011-07-12 DIAGNOSIS — O09529 Supervision of elderly multigravida, unspecified trimester: Secondary | ICD-10-CM

## 2011-07-12 DIAGNOSIS — O0990 Supervision of high risk pregnancy, unspecified, unspecified trimester: Secondary | ICD-10-CM | POA: Insufficient documentation

## 2011-07-12 DIAGNOSIS — I10 Essential (primary) hypertension: Secondary | ICD-10-CM | POA: Insufficient documentation

## 2011-07-12 DIAGNOSIS — O169 Unspecified maternal hypertension, unspecified trimester: Secondary | ICD-10-CM

## 2011-07-12 LAB — POCT URINALYSIS DIP (DEVICE)
Protein, ur: NEGATIVE mg/dL
Specific Gravity, Urine: 1.015 (ref 1.005–1.030)
Urobilinogen, UA: 0.2 mg/dL (ref 0.0–1.0)

## 2011-07-12 NOTE — Progress Notes (Signed)
Addended by: Sherre Lain A on: 07/12/2011 01:39 PM   Modules accepted: Orders

## 2011-07-12 NOTE — Progress Notes (Signed)
U/S scheduled 08/02/11 at 10 am.

## 2011-07-12 NOTE — Progress Notes (Signed)
Pulse:86 Pt collected her 24 hour urine but forgot to bring it with her today. She is unable to bring it back later.

## 2011-07-12 NOTE — Patient Instructions (Signed)
Pregnancy - Second Trimester The second trimester of pregnancy (3 to 6 months) is a period of rapid growth for you and your baby. At the end of the sixth month, your baby is about 9 inches long and weighs 1 1/2 pounds. You will begin to feel the baby move between 18 and 20 weeks of the pregnancy. This is called quickening. Weight gain is faster. A clear fluid (colostrum) may leak out of your breasts. You may feel small contractions of the womb (uterus). This is known as false labor or Braxton-Hicks contractions. This is like a practice for labor when the baby is ready to be born. Usually, the problems with morning sickness have usually passed by the end of your first trimester. Some women develop small dark blotches (called cholasma, mask of pregnancy) on their face that usually goes away after the baby is born. Exposure to the sun makes the blotches worse. Acne may also develop in some pregnant women and pregnant women who have acne, may find that it goes away. PRENATAL EXAMS  Blood work may continue to be done during prenatal exams. These tests are done to check on your health and the probable health of your baby. Blood work is used to follow your blood levels (hemoglobin). Anemia (low hemoglobin) is common during pregnancy. Iron and vitamins are given to help prevent this. You will also be checked for diabetes between 24 and 28 weeks of the pregnancy. Some of the previous blood tests may be repeated.   The size of the uterus is measured during each visit. This is to make sure that the baby is continuing to grow properly according to the dates of the pregnancy.   Your blood pressure is checked every prenatal visit. This is to make sure you are not getting toxemia.   Your urine is checked to make sure you do not have an infection, diabetes or protein in the urine.   Your weight is checked often to make sure gains are happening at the suggested rate. This is to ensure that both you and your baby are  growing normally.   Sometimes, an ultrasound is performed to confirm the proper growth and development of the baby. This is a test which bounces harmless sound waves off the baby so your caregiver can more accurately determine due dates.  Sometimes, a specialized test is done on the amniotic fluid surrounding the baby. This test is called an amniocentesis. The amniotic fluid is obtained by sticking a needle into the belly (abdomen). This is done to check the chromosomes in instances where there is a concern about possible genetic problems with the baby. It is also sometimes done near the end of pregnancy if an early delivery is required. In this case, it is done to help make sure the baby's lungs are mature enough for the baby to live outside of the womb. CHANGES OCCURING IN THE SECOND TRIMESTER OF PREGNANCY Your body goes through many changes during pregnancy. They vary from person to person. Talk to your caregiver about changes you notice that you are concerned about.  During the second trimester, you will likely have an increase in your appetite. It is normal to have cravings for certain foods. This varies from person to person and pregnancy to pregnancy.   Your lower abdomen will begin to bulge.   You may have to urinate more often because the uterus and baby are pressing on your bladder. It is also common to get more bladder infections during pregnancy (  pain with urination). You can help this by drinking lots of fluids and emptying your bladder before and after intercourse.   You may begin to get stretch marks on your hips, abdomen, and breasts. These are normal changes in the body during pregnancy. There are no exercises or medications to take that prevent this change.   You may begin to develop swollen and bulging veins (varicose veins) in your legs. Wearing support hose, elevating your feet for 15 minutes, 3 to 4 times a day and limiting salt in your diet helps lessen the problem.    Heartburn may develop as the uterus grows and pushes up against the stomach. Antacids recommended by your caregiver helps with this problem. Also, eating smaller meals 4 to 5 times a day helps.   Constipation can be treated with a stool softener or adding bulk to your diet. Drinking lots of fluids, vegetables, fruits, and whole grains are helpful.   Exercising is also helpful. If you have been very active up until your pregnancy, most of these activities can be continued during your pregnancy. If you have been less active, it is helpful to start an exercise program such as walking.   Hemorrhoids (varicose veins in the rectum) may develop at the end of the second trimester. Warm sitz baths and hemorrhoid cream recommended by your caregiver helps hemorrhoid problems.   Backaches may develop during this time of your pregnancy. Avoid heavy lifting, wear low heal shoes and practice good posture to help with backache problems.   Some pregnant women develop tingling and numbness of their hand and fingers because of swelling and tightening of ligaments in the wrist (carpel tunnel syndrome). This goes away after the baby is born.   As your breasts enlarge, you may have to get a bigger bra. Get a comfortable, cotton, support bra. Do not get a nursing bra until the last month of the pregnancy if you will be nursing the baby.   You may get a dark line from your belly button to the pubic area called the linea nigra.   You may develop rosy cheeks because of increase blood flow to the face.   You may develop spider looking lines of the face, neck, arms and chest. These go away after the baby is born.  HOME CARE INSTRUCTIONS   It is extremely important to avoid all smoking, herbs, alcohol, and unprescribed drugs during your pregnancy. These chemicals affect the formation and growth of the baby. Avoid these chemicals throughout the pregnancy to ensure the delivery of a healthy infant.   Most of your home  care instructions are the same as suggested for the first trimester of your pregnancy. Keep your caregiver's appointments. Follow your caregiver's instructions regarding medication use, exercise and diet.   During pregnancy, you are providing food for you and your baby. Continue to eat regular, well-balanced meals. Choose foods such as meat, fish, milk and other low fat dairy products, vegetables, fruits, and whole-grain breads and cereals. Your caregiver will tell you of the ideal weight gain.   A physical sexual relationship may be continued up until near the end of pregnancy if there are no other problems. Problems could include early (premature) leaking of amniotic fluid from the membranes, vaginal bleeding, abdominal pain, or other medical or pregnancy problems.   Exercise regularly if there are no restrictions. Check with your caregiver if you are unsure of the safety of some of your exercises. The greatest weight gain will occur in the   last 2 trimesters of pregnancy. Exercise will help you:   Control your weight.   Get you in shape for labor and delivery.   Lose weight after you have the baby.   Wear a good support or jogging bra for breast tenderness during pregnancy. This may help if worn during sleep. Pads or tissues may be used in the bra if you are leaking colostrum.   Do not use hot tubs, steam rooms or saunas throughout the pregnancy.   Wear your seat belt at all times when driving. This protects you and your baby if you are in an accident.   Avoid raw meat, uncooked cheese, cat litter boxes and soil used by cats. These carry germs that can cause birth defects in the baby.   The second trimester is also a good time to visit your dentist for your dental health if this has not been done yet. Getting your teeth cleaned is OK. Use a soft toothbrush. Brush gently during pregnancy.   It is easier to loose urine during pregnancy. Tightening up and strengthening the pelvic muscles will  help with this problem. Practice stopping your urination while you are going to the bathroom. These are the same muscles you need to strengthen. It is also the muscles you would use as if you were trying to stop from passing gas. You can practice tightening these muscles up 10 times a set and repeating this about 3 times per day. Once you know what muscles to tighten up, do not perform these exercises during urination. It is more likely to contribute to an infection by backing up the urine.   Ask for help if you have financial, counseling or nutritional needs during pregnancy. Your caregiver will be able to offer counseling for these needs as well as refer you for other special needs.   Your skin may become oily. If so, wash your face with mild soap, use non-greasy moisturizer and oil or cream based makeup.  MEDICATIONS AND DRUG USE IN PREGNANCY  Take prenatal vitamins as directed. The vitamin should contain 1 milligram of folic acid. Keep all vitamins out of reach of children. Only a couple vitamins or tablets containing iron may be fatal to a baby or young child when ingested.   Avoid use of all medications, including herbs, over-the-counter medications, not prescribed or suggested by your caregiver. Only take over-the-counter or prescription medicines for pain, discomfort, or fever as directed by your caregiver. Do not use aspirin.   Let your caregiver also know about herbs you may be using.   Alcohol is related to a number of birth defects. This includes fetal alcohol syndrome. All alcohol, in any form, should be avoided completely. Smoking will cause low birth rate and premature babies.   Street or illegal drugs are very harmful to the baby. They are absolutely forbidden. A baby born to an addicted mother will be addicted at birth. The baby will go through the same withdrawal an adult does.  SEEK MEDICAL CARE IF:  You have any concerns or worries during your pregnancy. It is better to call with  your questions if you feel they cannot wait, rather than worry about them. SEEK IMMEDIATE MEDICAL CARE IF:   An unexplained oral temperature above 102 F (38.9 C) develops, or as your caregiver suggests.   You have leaking of fluid from the vagina (birth canal). If leaking membranes are suspected, take your temperature and tell your caregiver of this when you call.   There   is vaginal spotting, bleeding, or passing clots. Tell your caregiver of the amount and how many pads are used. Light spotting in pregnancy is common, especially following intercourse.   You develop a bad smelling vaginal discharge with a change in the color from clear to white.   You continue to feel sick to your stomach (nauseated) and have no relief from remedies suggested. You vomit blood or coffee ground-like materials.   You lose more than 2 pounds of weight or gain more than 2 pounds of weight over 1 week, or as suggested by your caregiver.   You notice swelling of your face, hands, feet, or legs.   You get exposed to German measles and have never had them.   You are exposed to fifth disease or chickenpox.   You develop belly (abdominal) pain. Round ligament discomfort is a common non-cancerous (benign) cause of abdominal pain in pregnancy. Your caregiver still must evaluate you.   You develop a bad headache that does not go away.   You develop fever, diarrhea, pain with urination, or shortness of breath.   You develop visual problems, blurry, or double vision.   You fall or are in a car accident or any kind of trauma.   There is mental or physical violence at home.  Document Released: 03/27/2001 Document Revised: 03/22/2011 Document Reviewed: 09/29/2008 [employer] Patient Information 2012 [employer]. 

## 2011-07-12 NOTE — Progress Notes (Signed)
Hasn't taken percocet in over 1 month.  Takes flexeril occasionally.  No other complaints.  Incomplete fetal survey - repeat US in 3 weeksF/u in 2 weeks.

## 2011-07-13 LAB — COMPREHENSIVE METABOLIC PANEL
Alkaline Phosphatase: 66 U/L (ref 39–117)
Glucose, Bld: 87 mg/dL (ref 70–99)
Sodium: 137 mEq/L (ref 135–145)
Total Bilirubin: 0.2 mg/dL — ABNORMAL LOW (ref 0.3–1.2)
Total Protein: 6.3 g/dL (ref 6.0–8.3)

## 2011-07-13 LAB — CBC
Hemoglobin: 9.9 g/dL — ABNORMAL LOW (ref 12.0–15.0)
MCH: 21 pg — ABNORMAL LOW (ref 26.0–34.0)
MCV: 71.1 fL — ABNORMAL LOW (ref 78.0–100.0)
Platelets: 476 10*3/uL — ABNORMAL HIGH (ref 150–400)
RBC: 4.71 MIL/uL (ref 3.87–5.11)

## 2011-07-13 LAB — PROTEIN, URINE, 24 HOUR
Protein, 24H Urine: 80 mg/d (ref 50–100)
Protein, Urine: 8 mg/dL

## 2011-07-13 LAB — CREATININE CLEARANCE, URINE, 24 HOUR: Creatinine Clearance: 263 mL/min — ABNORMAL HIGH (ref 75–115)

## 2011-07-26 ENCOUNTER — Ambulatory Visit (INDEPENDENT_AMBULATORY_CARE_PROVIDER_SITE_OTHER): Payer: Medicaid Other | Admitting: Advanced Practice Midwife

## 2011-07-26 ENCOUNTER — Telehealth: Payer: Self-pay | Admitting: Hematology and Oncology

## 2011-07-26 DIAGNOSIS — R87811 Vaginal high risk human papillomavirus (HPV) DNA test positive: Secondary | ICD-10-CM

## 2011-07-26 DIAGNOSIS — IMO0002 Reserved for concepts with insufficient information to code with codable children: Secondary | ICD-10-CM

## 2011-07-26 DIAGNOSIS — O34219 Maternal care for unspecified type scar from previous cesarean delivery: Secondary | ICD-10-CM

## 2011-07-26 DIAGNOSIS — O09899 Supervision of other high risk pregnancies, unspecified trimester: Secondary | ICD-10-CM

## 2011-07-26 DIAGNOSIS — Z9889 Other specified postprocedural states: Secondary | ICD-10-CM | POA: Insufficient documentation

## 2011-07-26 NOTE — Progress Notes (Signed)
No vaginal discharge. Pulse 103.

## 2011-07-26 NOTE — Telephone Encounter (Signed)
pt called and l/m to r/s 04/2011 missed appt,inbox mess. sent to dr lo     aom

## 2011-07-26 NOTE — Progress Notes (Signed)
Anatomy scan incomplete. F/U scheduled 08/02/11. Poor appetite. Ensure daily. Denies N/V. Too see nutritionist. Discussed link btw poor maternal wt gain, LBW and dangers to baby. Baseline 24 hour urine 80. Early GTT 107. Do not see quad results from last visit. Draw today.

## 2011-07-26 NOTE — Progress Notes (Signed)
Nutrition Note:  (Referral for wt loss, 1st consult) Pt dx. HTN, Migraines, Anxiety, depression, severe anemia, obesity. Pt reports wt loss of 24# during pregnancy (pregravid wt 290#). Pt does not have any nausea or vomiting, but does have poor appetite and intake.   Takes 2 gummy vitamins and no Iron supplement even though reports severe anemia. Reports limited protein and grain intake, meats and PB "sits on top of stomach" and makes her feel full.  Eats a lot of fruits, limited vegetables.  Major constipation issues reported, has tried all OTC meds and prune juice.  Says prune juice works okay.   Pt is drinking Ensure daily.  Encouraged her to make effort to eat foods 4-5 small meals verse drinking supplement and not eating.  Pt has [facility] appointment 07/31/11 @ 2:30pm to apply.   Follow up in 2 weeks.  Cy Blamer, RD

## 2011-08-01 ENCOUNTER — Encounter: Payer: Self-pay | Admitting: Advanced Practice Midwife

## 2011-08-01 ENCOUNTER — Encounter: Payer: Self-pay | Admitting: *Deleted

## 2011-08-02 ENCOUNTER — Encounter: Payer: Self-pay | Admitting: Family Medicine

## 2011-08-02 ENCOUNTER — Ambulatory Visit (HOSPITAL_COMMUNITY)
Admission: RE | Admit: 2011-08-02 | Discharge: 2011-08-02 | Disposition: A | Discharge status: Home / Self Care | Payer: Medicaid Other | Source: Ambulatory Visit | Attending: Family Medicine | Admitting: Family Medicine

## 2011-08-02 DIAGNOSIS — O34219 Maternal care for unspecified type scar from previous cesarean delivery: Secondary | ICD-10-CM | POA: Insufficient documentation

## 2011-08-02 DIAGNOSIS — O3410 Maternal care for benign tumor of corpus uteri, unspecified trimester: Secondary | ICD-10-CM | POA: Insufficient documentation

## 2011-08-02 DIAGNOSIS — O09529 Supervision of elderly multigravida, unspecified trimester: Secondary | ICD-10-CM | POA: Insufficient documentation

## 2011-08-02 DIAGNOSIS — O10019 Pre-existing essential hypertension complicating pregnancy, unspecified trimester: Secondary | ICD-10-CM | POA: Insufficient documentation

## 2011-08-07 ENCOUNTER — Encounter: Payer: Self-pay | Admitting: *Deleted

## 2011-08-09 ENCOUNTER — Encounter: Payer: Self-pay | Admitting: Advanced Practice Midwife

## 2011-08-09 ENCOUNTER — Ambulatory Visit (INDEPENDENT_AMBULATORY_CARE_PROVIDER_SITE_OTHER): Payer: Medicaid Other | Admitting: Advanced Practice Midwife

## 2011-08-09 VITALS — BP 140/85 | Temp 96.8°F | Wt 269.8 lb

## 2011-08-09 DIAGNOSIS — O169 Unspecified maternal hypertension, unspecified trimester: Secondary | ICD-10-CM

## 2011-08-09 DIAGNOSIS — O34219 Maternal care for unspecified type scar from previous cesarean delivery: Secondary | ICD-10-CM

## 2011-08-09 DIAGNOSIS — O09529 Supervision of elderly multigravida, unspecified trimester: Secondary | ICD-10-CM

## 2011-08-09 DIAGNOSIS — O09899 Supervision of other high risk pregnancies, unspecified trimester: Secondary | ICD-10-CM

## 2011-08-09 DIAGNOSIS — D649 Anemia, unspecified: Secondary | ICD-10-CM

## 2011-08-09 LAB — POCT URINALYSIS DIP (DEVICE)
Bilirubin Urine: NEGATIVE
Glucose, UA: NEGATIVE mg/dL
Ketones, ur: NEGATIVE mg/dL
Specific Gravity, Urine: 1.015 (ref 1.005–1.030)

## 2011-08-09 NOTE — Progress Notes (Signed)
U/S scheduled at [facility] Sep 06, 2011 at 8 am.

## 2011-08-09 NOTE — Progress Notes (Signed)
F/U US some additional anatomy seen. HC <3%. Consulted w/ Dr. Debroah Loop. No change in EDD. Growth [place] in 4 weeks w/ [facility]. 1 hour GTT at [place]. Pt sees Dr. Dalene Carrow anemia and Q3 month iron infusions. Worsening Sx. Has been unable to rescheduled missed appointment. Requests CBC. Weight gain appropriate since last visit. Is eating small, frequent meals and ensure.

## 2011-08-09 NOTE — Progress Notes (Signed)
Pulse: 104

## 2011-08-10 LAB — CBC
HCT: 32.4 % — ABNORMAL LOW (ref 36.0–46.0)
Hemoglobin: 9.9 g/dL — ABNORMAL LOW (ref 12.0–15.0)
MCV: 70.3 fL — ABNORMAL LOW (ref 78.0–100.0)
RBC: 4.61 MIL/uL (ref 3.87–5.11)
WBC: 12.7 10*3/uL — ABNORMAL HIGH (ref 4.0–10.5)

## 2011-08-17 ENCOUNTER — Other Ambulatory Visit: Payer: Self-pay | Admitting: *Deleted

## 2011-08-17 DIAGNOSIS — D649 Anemia, unspecified: Secondary | ICD-10-CM

## 2011-08-18 ENCOUNTER — Telehealth: Payer: Self-pay | Admitting: Hematology and Oncology

## 2011-08-18 NOTE — Telephone Encounter (Signed)
S/w pt re appts for 5/13 and 5/15. Date/SJ per 5/3 pof

## 2011-08-27 ENCOUNTER — Other Ambulatory Visit (HOSPITAL_BASED_OUTPATIENT_CLINIC_OR_DEPARTMENT_OTHER): Payer: Medicaid Other | Admitting: Lab

## 2011-08-27 DIAGNOSIS — D638 Anemia in other chronic diseases classified elsewhere: Secondary | ICD-10-CM

## 2011-08-27 DIAGNOSIS — D649 Anemia, unspecified: Secondary | ICD-10-CM

## 2011-08-27 LAB — COMPREHENSIVE METABOLIC PANEL
ALT: <8 U/L (ref 0–35)
AST: 9 U/L (ref 0–37)
Calcium: 8.9 mg/dL (ref 8.4–10.5)
Chloride: 106 mEq/L (ref 96–112)
Creatinine, Ser: 0.4 mg/dL — ABNORMAL LOW (ref 0.50–1.10)
Sodium: 138 mEq/L (ref 135–145)
Total Bilirubin: 0.3 mg/dL (ref 0.3–1.2)
Total Protein: 6.3 g/dL (ref 6.0–8.3)

## 2011-08-27 LAB — CBC WITH DIFFERENTIAL/PLATELET
BASO%: 0.2 % (ref 0.0–2.0)
Eosinophils Absolute: 0.2 10*3/uL (ref 0.0–0.5)
HCT: 29.3 % — ABNORMAL LOW (ref 34.8–46.6)
HGB: 9 g/dL — ABNORMAL LOW (ref 11.6–15.9)
LYMPH%: 12.6 % — ABNORMAL LOW (ref 14.0–49.7)
MCH: 20.1 pg — ABNORMAL LOW (ref 25.1–34.0)
MCV: 65.5 fL — ABNORMAL LOW (ref 79.5–101.0)
lymph#: 1.6 10*3/uL (ref 0.9–3.3)
nRBC: 0 % (ref 0–0)

## 2011-08-27 LAB — FERRITIN: Ferritin: 36 ng/mL (ref 10–291)

## 2011-08-27 LAB — IRON AND TIBC: %SAT: 4 % — ABNORMAL LOW (ref 20–55)

## 2011-08-29 ENCOUNTER — Ambulatory Visit (HOSPITAL_BASED_OUTPATIENT_CLINIC_OR_DEPARTMENT_OTHER): Payer: Medicaid Other | Admitting: Nurse Practitioner

## 2011-08-29 ENCOUNTER — Encounter: Payer: Self-pay | Admitting: Nurse Practitioner

## 2011-08-29 ENCOUNTER — Telehealth: Payer: Self-pay | Admitting: Hematology and Oncology

## 2011-08-29 VITALS — BP 117/62 | HR 109 | Temp 98.9°F | Ht 62.0 in | Wt 272.1 lb

## 2011-08-29 DIAGNOSIS — D509 Iron deficiency anemia, unspecified: Secondary | ICD-10-CM

## 2011-08-29 DIAGNOSIS — D649 Anemia, unspecified: Secondary | ICD-10-CM

## 2011-08-29 DIAGNOSIS — Z331 Pregnant state, incidental: Secondary | ICD-10-CM

## 2011-08-29 NOTE — Telephone Encounter (Signed)
Gave pt appt for November 2013 lab and MD °

## 2011-08-29 NOTE — Progress Notes (Signed)
[facility]  OUTPATIENT CLINIC NOTE  08/29/2011   CHIEF COMPLAINT: Patient presents for follow up of anemia. "I've been tired recently"   DIAGNOSIS: Anemia, HTN, high risk pregnancy   CURRENT THERAPY: Oral iron   HPI:  Patient presents today for routine follow up of iron deficiency anemia. She is due to deliver via C-section in August. Overall she is tolerating the pregnancy quite well and her hgb  Is 9.0 with Ferritin level of 36 and iron level is 18 (up from 12 last time). She continues to take oral iron.    REVIEW OF SYSTEMS: Pertinent items are noted in HPI.   PHYSICAL EXAMINATION: General Appearance: Normal - Healthy appearing patient in no acute distress  Skin: Normal - No skin rash, petechiae, ecchymosis noted  HEENT: Normal - No oral or pharyngeal masses, ulceration or thrush noted, EOMI, no scleral icterus  Lungs/Thorax: Normal - Clear to auscultation and percussion  Heart: Abnormal - S1, S2, tachycardic with no murmurs , gallops or rubs  Neurologic: Normal - Grossly intact   LABORATORY DATA: Iron/TIBC/Ferritin    Component Value Date/Time   IRON 18* 08/27/2011 1434   TIBC 494* 08/27/2011 1434   FERRITIN 36 08/27/2011 1434   CBC    Component Value Date/Time   WBC 12.4* 08/27/2011 1434   WBC 12.7* 08/09/2011 0950   RBC 4.47 08/27/2011 1434   RBC 4.61 08/09/2011 0950   HGB 9.0* 08/27/2011 1434   HGB 9.9* 08/09/2011 0950   HCT 29.3* 08/27/2011 1434   HCT 32.4* 08/09/2011 0950   PLT 404* 08/27/2011 1434   PLT 442* 08/09/2011 0950   MCV 65.5* 08/27/2011 1434   MCV 70.3* 08/09/2011 0950   MCH 20.1* 08/27/2011 1434   MCH 21.5* 08/09/2011 0950   MCHC 30.6* 08/27/2011 1434   MCHC 30.6 08/09/2011 0950   RDW 17.5* 08/27/2011 1434   RDW 17.1* 08/09/2011 0950   LYMPHSABS 1.6 08/27/2011 1434   LYMPHSABS 1.8 02/16/2011 1825   MONOABS 0.7 08/27/2011 1434   MONOABS 0.6 02/16/2011 1825   EOSABS 0.2 08/27/2011 1434   EOSABS 0.3 02/16/2011 1825   BASOSABS 0.0 08/27/2011  1434   BASOSABS 0.0 02/16/2011 1825    No results found for this or any previous visit (from the past 48 hour(s)).   ASSESSMENT: Iron deficiency anemia and anemia of chronic disease, with menorrhagia, fibromyalgia, currently pregnant, due to deliver in August.    PLAN: Recommended patient continue oral iron throughout pregnancy and postpartum recovery. We will see her back in 6 months (November). Her levels are actually quite good given that she is in a high risk pregnancy. She was not short of breath and did not seem excessively fatigued today though certainly she has some generalized fatigue associated with her pregnancy.    All questions were answered. The patient knows to call the clinic with any problems, questions or concerns. We can certainly see the patient much sooner if necessary.  The patient and plan discussed with Arlan Organ, MD and she is in agreement with the aforementioned.  I spent 20 minutes counseling the patient face to face. The total time spent in the appointment was 20 minutes.  Bobbe Medico, AOCNP, NP-C

## 2011-09-04 ENCOUNTER — Encounter (HOSPITAL_BASED_OUTPATIENT_CLINIC_OR_DEPARTMENT_OTHER): Payer: Self-pay | Admitting: Emergency Medicine

## 2011-09-04 ENCOUNTER — Emergency Department (HOSPITAL_BASED_OUTPATIENT_CLINIC_OR_DEPARTMENT_OTHER)
Admission: EM | Admit: 2011-09-04 | Discharge: 2011-09-04 | Disposition: A | Discharge status: Home / Self Care | Payer: Medicaid Other | Attending: Emergency Medicine | Admitting: Emergency Medicine

## 2011-09-04 DIAGNOSIS — Z79899 Other long term (current) drug therapy: Secondary | ICD-10-CM | POA: Insufficient documentation

## 2011-09-04 DIAGNOSIS — I10 Essential (primary) hypertension: Secondary | ICD-10-CM | POA: Insufficient documentation

## 2011-09-04 DIAGNOSIS — IMO0001 Reserved for inherently not codable concepts without codable children: Secondary | ICD-10-CM | POA: Insufficient documentation

## 2011-09-04 DIAGNOSIS — J329 Chronic sinusitis, unspecified: Secondary | ICD-10-CM | POA: Insufficient documentation

## 2011-09-04 LAB — GLUCOSE, CAPILLARY: Glucose-Capillary: 92 mg/dL (ref 70–99)

## 2011-09-04 LAB — URINALYSIS, ROUTINE W REFLEX MICROSCOPIC
Bilirubin Urine: NEGATIVE
Hgb urine dipstick: NEGATIVE
Nitrite: NEGATIVE
Specific Gravity, Urine: 1.005 (ref 1.005–1.030)
Urobilinogen, UA: 0.2 mg/dL (ref 0.0–1.0)
pH: 6.5 (ref 5.0–8.0)

## 2011-09-04 MED ORDER — SODIUM CHLORIDE 0.9 % IV BOLUS (SEPSIS)
1000.0000 mL | Freq: Once | INTRAVENOUS | Status: AC
  Administered 2011-09-04: 1000 mL via INTRAVENOUS

## 2011-09-04 MED ORDER — AMOXICILLIN 500 MG PO CAPS: 1000.0000 mg | ORAL_CAPSULE | Freq: Three times a day (TID) | ORAL | Status: DC

## 2011-09-04 NOTE — ED Notes (Signed)
Pt took at tylenol and percocet at 2 am

## 2011-09-04 NOTE — ED Provider Notes (Addendum)
History     CSN: [id number]  Arrival date & time 09/04/11  0306   First MD Initiated Contact with Patient 09/04/11 0315      Chief Complaint  Patient presents with  . URI    (Consider location/radiation/quality/duration/timing/severity/associated sxs/prior treatment) HPI This is a 79 your black female who is complaining of about a three-month history of nasal congestion and sinus pressure. She states it got better but took an acute turn for the worse 4 days ago. Associated with that has been a sore throat, which she attributes to postnasal drip. The sore throat is making swallowing difficult thus causing her to have decreased intake. She has had an occasional cough but no wheezing or dyspnea. She has had a headache associated with this which is moderate in severity. She has taken Tylenol as well as Percocet to treat the headache. She is limiting her use of Percocet due to being pregnant. She was nauseated yesterday evening but treated this with ondansetron. She states she's had a fever as high as 101. She is noted to be tachycardic.  Past Medical History  Diagnosis Date  . Hypertension   . Migraine   . Anxiety   . Abnormal Pap smear   . Anemia   . Fibroids   . Fibromyalgia     degenerative disc disease  . Depression     Past Surgical History  Procedure Date  . Wisdom tooth extraction   . Mandible surgery   . Carpal tunnel release   . Cesarean section     1994, 1996     Family History  Problem Relation Age of Onset  . Sickle cell trait Maternal Aunt   . Sickle cell anemia Other     History  Substance Use Topics  . Smoking status: Never Smoker   . Smokeless tobacco: Never Used  . Alcohol Use: No    OB History    Grav Para Term Preterm Abortions TAB SAB Ect Mult Living   3 2 2       2       Review of Systems  All other systems reviewed and are negative.    Allergies  Latex and Morphine and related  Home Medications   Current Outpatient Rx  Name Route  Sig Dispense Refill  . CYCLOBENZAPRINE HCL 10 MG PO TABS Oral Take 10 mg by mouth 3 (three) times daily as needed.    [name] DIPHENHYDRAMINE-APAP (SLEEP) 25-500 MG PO TABS Oral Take 1 tablet by mouth at bedtime as needed. Takes for sleep    . LABETALOL HCL 100 MG PO TABS Oral Take 1 tablet (100 mg total) by mouth 2 (two) times daily. 60 tablet 11  . MULTI-VITAMIN GUMMIES PO CHEW Oral Chew 1 tablet by mouth daily.    . OXYCODONE-ACETAMINOPHEN 5-325 MG PO TABS Oral Take 1 tablet by mouth every 8 (eight) hours as needed.    . IRON DEXTRAN IVPB Intravenous Inject into the vein every 3 (three) months.      BP 152/74  Pulse 124  Temp(Src) 98.4 F (36.9 C) (Oral)  Resp 22  SpO2 100%  LMP 02/19/2011  Physical Exam General: Well-developed, well-nourished female in no acute distress; appearance consistent with age of record HENT: normocephalic, atraumatic; enlarged tonsils without erythema or exudate; nasal congestion; tenderness to percussion of sinuses Eyes: pupils equal round and reactive to light; extraocular muscles intact Neck: supple Heart: regular rate and rhythm; tachycardic Lungs: clear to auscultation bilaterally Abdomen: soft; gravid; nontender Extremities: No  deformity; full range of motion; pulses normal Neurologic: Awake, alert and oriented; motor function intact in all extremities and symmetric; no facial droop Skin: Warm and dry Psychiatric: Normal mood and affect    ED Course  Procedures (including critical care time)    MDM   Nursing notes and vitals signs, including pulse oximetry, reviewed.  Summary of this visit's results, reviewed by myself:  Labs:  Results for orders placed during the hospital encounter of 09/04/11  RAPID STREP SCREEN      Component Value Range   Streptococcus, Group A Screen (Direct) NEGATIVE  NEGATIVE   URINALYSIS, ROUTINE W REFLEX MICROSCOPIC      Component Value Range   Color, Urine YELLOW  YELLOW    APPearance CLEAR  CLEAR    Specific  Gravity, Urine 1.005  1.005 - 1.030    pH 6.5  5.0 - 8.0    Glucose, UA NEGATIVE  NEGATIVE (mg/dL)   Hgb urine dipstick NEGATIVE  NEGATIVE    Bilirubin Urine NEGATIVE  NEGATIVE    Ketones, ur NEGATIVE  NEGATIVE (mg/dL)   Protein, ur NEGATIVE  NEGATIVE (mg/dL)   Urobilinogen, UA 0.2  0.0 - 1.0 (mg/dL)   Nitrite NEGATIVE  NEGATIVE    Leukocytes, UA NEGATIVE  NEGATIVE   GLUCOSE, CAPILLARY      Component Value Range   Glucose-Capillary 92  70 - 99 (mg/dL)   Comment 1 Notify RN      4:15 AM Tachycardia and headache have improved with IV hydration. We will treat her with amoxicillin for sinusitis.         Hanley Seamen, MD 09/04/11 0416  Hanley Seamen, MD 09/04/11 [phone]

## 2011-09-04 NOTE — ED Notes (Signed)
Pt c/o headache, congestion, cough and fever.

## 2011-09-04 NOTE — Discharge Instructions (Signed)

## 2011-09-04 NOTE — ED Notes (Signed)
Patients glucose was 92 nurse was told of result.

## 2011-09-06 ENCOUNTER — Encounter (HOSPITAL_COMMUNITY): Payer: Self-pay

## 2011-09-06 ENCOUNTER — Ambulatory Visit (INDEPENDENT_AMBULATORY_CARE_PROVIDER_SITE_OTHER): Payer: Medicaid Other | Admitting: Physician Assistant

## 2011-09-06 ENCOUNTER — Ambulatory Visit (HOSPITAL_COMMUNITY)
Admission: RE | Admit: 2011-09-06 | Discharge: 2011-09-06 | Disposition: A | Discharge status: Home / Self Care | Payer: Medicaid Other | Source: Ambulatory Visit | Attending: Advanced Practice Midwife | Admitting: Advanced Practice Midwife

## 2011-09-06 VITALS — BP 126/74 | Temp 97.2°F | Wt 267.6 lb

## 2011-09-06 DIAGNOSIS — O3410 Maternal care for benign tumor of corpus uteri, unspecified trimester: Secondary | ICD-10-CM | POA: Insufficient documentation

## 2011-09-06 DIAGNOSIS — O10019 Pre-existing essential hypertension complicating pregnancy, unspecified trimester: Secondary | ICD-10-CM | POA: Insufficient documentation

## 2011-09-06 DIAGNOSIS — O34219 Maternal care for unspecified type scar from previous cesarean delivery: Secondary | ICD-10-CM | POA: Insufficient documentation

## 2011-09-06 DIAGNOSIS — O09529 Supervision of elderly multigravida, unspecified trimester: Secondary | ICD-10-CM

## 2011-09-06 LAB — CBC
Hemoglobin: 9.7 g/dL — ABNORMAL LOW (ref 12.0–15.0)
MCH: 20.2 pg — ABNORMAL LOW (ref 26.0–34.0)
Platelets: 408 10*3/uL — ABNORMAL HIGH (ref 150–400)
RBC: 4.8 MIL/uL (ref 3.87–5.11)

## 2011-09-06 LAB — POCT URINALYSIS DIP (DEVICE)
Bilirubin Urine: NEGATIVE
Glucose, UA: NEGATIVE mg/dL
Ketones, ur: NEGATIVE mg/dL
Leukocytes, UA: NEGATIVE
Protein, ur: NEGATIVE mg/dL
Specific Gravity, Urine: 1.025 (ref 1.005–1.030)

## 2011-09-06 NOTE — Progress Notes (Signed)
Nutrition Note: (referral for continued wt loss) Pt weight is still fluctuating.  161+09 (4/11), 604+54 (5/25) and 098+11 (5/23). Pt is still not eating a wide variety of foods. She is filling up on grains and fruits and avoiding proteins.   Iron levels have decreased to 9.0/29.3% (08/27/11).  NO iron supplement, "it does not work" per patient.  Here with mother today.  Mother is determined to help improve her eating habits.   Agree increase protein intake ASAP.  Will have at least 1 serving of protein every day.  Will also be aware of filling up on empty calorie drinks like Gatorade and juice instead of eating meals.  Reports hospitalization for dehydration this week.  Will also increase water intake.  Agrees to only drink Ensure when does not eat.  Encouraged well balanced meal instead of Ensure.  Follow up if referred.  Cy Blamer, RD

## 2011-09-06 NOTE — ED Notes (Signed)
Denies any contractions, bleeding or leaking.  FM present. Last H/H on 08/27/2011 9.0/29.3

## 2011-09-06 NOTE — Progress Notes (Signed)
Pulse- 102 Edema- "between knees and ankles"  Pain- lower abd pain

## 2011-09-06 NOTE — Patient Instructions (Signed)
Colposcopy Colposcopy is a procedure that uses a special lighted microscope (colposcope). It examines your cervix and vagina, or the area around the outside of the vagina, for signs of disease or abnormalities in the cells. You may be sent to a specialist (gynecologist) to do the colposcopy. A biopsy (tissue sample) may be collected during a colposcopy, if the caregiver finds any unusual cells. The biopsy is sent to the lab for further testing, and the results are reported back to your caregiver. A WOMAN MAY NEED THIS PROCEDURE IF:  She has had an abnormal pap smear (taking cells from the cervix for testing).   She has a sore on her cervix, and a Pap test was normal.   The Pap test suggests human papilloma virus (HPV). This virus can cause genital warts and is linked to the development of cervical cancer.   She has genital warts on the cervix, or in or around the outside of the vagina.   Her mother took the drug DES while pregnant.   She has painful intercourse.   She has vaginal bleeding, especially after sexual intercourse.   There is a need to evaluate the results of previous treatment.  BEFORE THE PROCEDURE   Colposcopy is done when you are not having a menstrual period.   For 24 hours before the colposcopy, do not:   Douche.   Use tampons.   Use medicines, creams, or suppositories in the vagina.   Have sexual intercourse.  PROCEDURE   A colposcopy is done while a woman is lying on her back with her feet in foot rests (stirrups).   A speculum is placed inside the vagina to keep it open and to allow the caregiver to see the cervix. This is the same instrument used to do a pap smear.   The colposcope is placed outside the vagina. It is used to magnify and examine the cervix, vagina, and the area around the outside of the vagina.   A small amount of liquid solution is placed on the area that is to be viewed. This solution is placed on with a cotton applicator. This solution  makes it easier to see the abnormal cells.   Your caregiver will suck out mucus and cells from the canal of the cervix.   Small pieces of tissue for biopsy may be taken at the same time. You may feel mild pain or discomfort when this is done.   Your caregiver will record the location of the abnormal areas and send the tissue samples to a lab for analysis.   If your caregiver biopsies the vagina or outside of the vagina, a local anesthetic (novocaine) is usually given.  AFTER THE PROCEDURE   You may have some cramping that often goes away in a few minutes. You may have some soreness for a couple of days.   You may take over-the-counter pain medicine as advised by your caregiver. Do not take aspirin because it can cause bleeding.   Lie down for a few minutes if you feel lightheaded.   You may have some bleeding or dark discharge that should stop in a few days.   You may need to wear a sanitary pad for a few days.  HOME CARE INSTRUCTIONS   Avoid sex, douching, and using tampons for a week or as directed.   Only take medicine as directed by your caregiver.   Continue to take birth control pills, if you are on them.   Not all test results are   available during your visit. If your test results are not back during the visit, make an appointment with your caregiver to find out the results. Do not assume everything is normal if you have not heard from your caregiver or the medical facility. It is important for you to follow up on all of your test results.   Follow your caregiver's advice regarding medicines, activity, follow-up visits, and follow-up Pap tests.  SEEK MEDICAL CARE IF:   You develop a rash.   You have problems with your medicine.  SEEK IMMEDIATE MEDICAL CARE IF:  You are bleeding heavily or are passing blood clots.   You develop a fever over 102 F (38.9 C), with or without chills.   You have abnormal vaginal discharge.   You are having cramps that do not go away  after taking your pain medicine.   You feel lightheaded, dizzy, or faint.   You develop stomach pain.  Document Released: 06/23/2002 Document Revised: 03/22/2011 Document Reviewed: 02/03/2009 [employer] Patient Information 2012 [employer]. 

## 2011-09-06 NOTE — Progress Notes (Signed)
No complaints. +FM. Reviewed abnormal pap result and need for colpo. Nutrition today. Hgb 9.0, will continue to monitor closely to determine need for Iron transfusion. Dr. Dalene Carrow holding at present. Would recommend proceeding transfusion if Hgb < 8.0.

## 2011-09-06 NOTE — Progress Notes (Signed)
Patient seen today  for follow up ultrasound.  See full report in [facility].  Alpha Gula, MD  Single IUP at 28 3/7 weeks Normal anatomic fetal survey; somewhat limted views of the fetal face (profile) were obtained Fetal growth is appropriate (47th precentile) Normal amniotic fluid volume  Recommend follow up as clinically indicated

## 2011-09-07 LAB — RPR

## 2011-09-07 LAB — GLUCOSE TOLERANCE, 1 HOUR: Glucose, 1 Hour GTT: 116 mg/dL (ref 70–140)

## 2011-09-10 ENCOUNTER — Encounter (HOSPITAL_BASED_OUTPATIENT_CLINIC_OR_DEPARTMENT_OTHER): Payer: Self-pay | Admitting: *Deleted

## 2011-09-10 ENCOUNTER — Emergency Department (HOSPITAL_BASED_OUTPATIENT_CLINIC_OR_DEPARTMENT_OTHER)
Admission: EM | Admit: 2011-09-10 | Discharge: 2011-09-10 | Disposition: A | Discharge status: Home / Self Care | Payer: Medicaid Other | Attending: Emergency Medicine | Admitting: Emergency Medicine

## 2011-09-10 DIAGNOSIS — J45909 Unspecified asthma, uncomplicated: Secondary | ICD-10-CM | POA: Insufficient documentation

## 2011-09-10 DIAGNOSIS — I10 Essential (primary) hypertension: Secondary | ICD-10-CM | POA: Insufficient documentation

## 2011-09-10 DIAGNOSIS — R059 Cough, unspecified: Secondary | ICD-10-CM | POA: Insufficient documentation

## 2011-09-10 DIAGNOSIS — G4730 Sleep apnea, unspecified: Secondary | ICD-10-CM | POA: Insufficient documentation

## 2011-09-10 DIAGNOSIS — R0602 Shortness of breath: Secondary | ICD-10-CM | POA: Insufficient documentation

## 2011-09-10 DIAGNOSIS — R05 Cough: Secondary | ICD-10-CM | POA: Insufficient documentation

## 2011-09-10 MED ORDER — ALBUTEROL SULFATE HFA 108 (90 BASE) MCG/ACT IN AERS
INHALATION_SPRAY | RESPIRATORY_TRACT | Status: AC
  Administered 2011-09-10: 2 via RESPIRATORY_TRACT
  Filled 2011-09-10: qty 6.7

## 2011-09-10 MED ORDER — PREDNISONE 20 MG PO TABS: 40.0000 mg | ORAL_TABLET | Freq: Every day | ORAL | Status: AC

## 2011-09-10 MED ORDER — ALBUTEROL SULFATE HFA 108 (90 BASE) MCG/ACT IN AERS
2.0000 | INHALATION_SPRAY | RESPIRATORY_TRACT | Status: DC | PRN
  Administered 2011-09-10: 2 via RESPIRATORY_TRACT

## 2011-09-10 MED ORDER — ALBUTEROL SULFATE (5 MG/ML) 0.5% IN NEBU
5.0000 mg | INHALATION_SOLUTION | Freq: Once | RESPIRATORY_TRACT | Status: AC
  Administered 2011-09-10: 5 mg via RESPIRATORY_TRACT
  Filled 2011-09-10: qty 1

## 2011-09-10 MED ORDER — PREDNISONE 50 MG PO TABS
60.0000 mg | ORAL_TABLET | Freq: Once | ORAL | Status: AC
  Administered 2011-09-10: 60 mg via ORAL
  Filled 2011-09-10: qty 1

## 2011-09-10 MED ORDER — IPRATROPIUM BROMIDE 0.02 % IN SOLN
0.5000 mg | Freq: Once | RESPIRATORY_TRACT | Status: AC
  Administered 2011-09-10: 0.5 mg via RESPIRATORY_TRACT
  Filled 2011-09-10: qty 2.5

## 2011-09-10 NOTE — Discharge Instructions (Signed)
Asthma Attack Prevention HOW CAN ASTHMA BE PREVENTED? Currently, there is no way to prevent asthma from starting. However, you can take steps to control the disease and prevent its symptoms after you have been diagnosed. Learn about your asthma and how to control it. Take an active role to control your asthma by working with your caregiver to create and follow an asthma action plan. An asthma action plan guides you in taking your medicines properly, avoiding factors that make your asthma worse, tracking your level of asthma control, responding to worsening asthma, and seeking emergency care when needed. To track your asthma, keep records of your symptoms, check your peak flow number using a peak flow meter (handheld device that shows how well air moves out of your lungs), and get regular asthma checkups.  Other ways to prevent asthma attacks include:  Use medicines as your caregiver directs.   Identify and avoid things that make your asthma worse (as much as you can).   Keep track of your asthma symptoms and level of control.   Get regular checkups for your asthma.   With your caregiver, write a detailed plan for taking medicines and managing an asthma attack. Then be sure to follow your action plan. Asthma is an ongoing condition that needs regular monitoring and treatment.   Identify and avoid asthma triggers. A number of outdoor allergens and irritants (pollen, mold, cold air, air pollution) can trigger asthma attacks. Find out what causes or makes your asthma worse, and take steps to avoid those triggers (see below).   Monitor your breathing. Learn to recognize warning signs of an attack, such as slight coughing, wheezing or shortness of breath. However, your lung function may already decrease before you notice any signs or symptoms, so regularly measure and record your peak airflow with a home peak flow meter.   Identify and treat attacks early. If you act quickly, you're less likely to have  a severe attack. You will also need less medicine to control your symptoms. When your peak flow measurements decrease and alert you to an upcoming attack, take your medicine as instructed, and immediately stop any activity that may have triggered the attack. If your symptoms do not improve, get medical help.   Pay attention to increasing quick-relief inhaler use. If you find yourself relying on your quick-relief inhaler (such as albuterol), your asthma is not under control. See your caregiver about adjusting your treatment.  IDENTIFY AND CONTROL FACTORS THAT MAKE YOUR ASTHMA WORSE A number of common things can set off or make your asthma symptoms worse (asthma triggers). Keep track of your asthma symptoms for several weeks, detailing all the environmental and emotional factors that are linked with your asthma. When you have an asthma attack, go back to your asthma diary to see which factor, or combination of factors, might have contributed to it. Once you know what these factors are, you can take steps to control many of them.  Allergies: If you have allergies and asthma, it is important to take asthma prevention steps at home. Asthma attacks (worsening of asthma symptoms) can be triggered by allergies, which can cause temporary increased inflammation of your airways. Minimizing contact with the substance to which you are allergic will help prevent an asthma attack. Animal Dander:   Some people are allergic to the flakes of skin or dried saliva from animals with fur or feathers. Keep these pets out of your home.   If you can't keep a pet outdoors, keep the   pet out of your bedroom and other sleeping areas at all times, and keep the door closed.   Remove carpets and furniture covered with cloth from your home. If that is not possible, keep the pet away from fabric-covered furniture and carpets.  Dust Mites:  Many people with asthma are allergic to dust mites. Dust mites are tiny bugs that are found in  every home, in mattresses, pillows, carpets, fabric-covered furniture, bedcovers, clothes, stuffed toys, fabric, and other fabric-covered items.   Cover your mattress in a special dust-proof cover.   Cover your pillow in a special dust-proof cover, or wash the pillow each week in hot water. Water must be hotter than 130 F to kill dust mites. Cold or warm water used with detergent and bleach can also be effective.   Wash the sheets and blankets on your bed each week in hot water.   Try not to sleep or lie on cloth-covered cushions.   Call ahead when traveling and ask for a smoke-free hotel room. Bring your own bedding and pillows, in case the hotel only supplies feather pillows and down comforters, which may contain dust mites and cause asthma symptoms.   Remove carpets from your bedroom and those laid on concrete, if you can.   Keep stuffed toys out of the bed, or wash the toys weekly in hot water or cooler water with detergent and bleach.  Cockroaches:  Many people with asthma are allergic to the droppings and remains of cockroaches.   Keep food and garbage in closed containers. Never leave food out.   Use poison baits, traps, powders, gels, or paste (for example, boric acid).   If a spray is used to kill cockroaches, stay out of the room until the odor goes away.  Indoor Mold:  Fix leaky faucets, pipes, or other sources of water that have mold around them.   Clean moldy surfaces with a cleaner that has bleach in it.  Pollen and Outdoor Mold:  When pollen or mold spore counts are high, try to keep your windows closed.   Stay indoors with windows closed from late morning to afternoon, if you can. Pollen and some mold spore counts are highest at that time.   Ask your caregiver whether you need to take or increase anti-inflammatory medicine before your allergy season starts.  Irritants:   Tobacco smoke is an irritant. If you smoke, ask your caregiver how you can quit. Ask family  members to quit smoking, too. Do not allow smoking in your home or car.   If possible, do not use a wood-burning stove, kerosene heater, or fireplace. Minimize exposure to all sources of smoke, including incense, candles, fires, and fireworks.   Try to stay away from strong odors and sprays, such as perfume, talcum powder, hair spray, and paints.   Decrease humidity in your home and use an indoor air cleaning device. Reduce indoor humidity to below 60 percent. Dehumidifiers or central air conditioners can do this.   Try to have someone else vacuum for you once or twice a week, if you can. Stay out of rooms while they are being vacuumed and for a short while afterward.   If you vacuum, use a dust mask from a hardware store, a double-layered or microfilter vacuum cleaner bag, or a vacuum cleaner with a HEPA filter.   Sulfites in foods and beverages can be irritants. Do not drink beer or wine, or eat dried fruit, processed potatoes, or shrimp if they cause asthma   symptoms.   Cold air can trigger an asthma attack. Cover your nose and mouth with a scarf on cold or windy days.   Several health conditions can make asthma more difficult to manage, including runny nose, sinus infections, reflux disease, psychological stress, and sleep apnea. Your caregiver will treat these conditions, as well.   Avoid close contact with people who have a cold or the flu, since your asthma symptoms may get worse if you catch the infection from them. Wash your hands thoroughly after touching items that may have been handled by people with a respiratory infection.   Get a flu shot every year to protect against the flu virus, which often makes asthma worse for days or weeks. Also get a pneumonia shot once every five to 10 years.  Drugs:  Aspirin and other painkillers can cause asthma attacks. 10% to 20% of people with asthma have sensitivity to aspirin or a group of painkillers called non-steroidal anti-inflammatory drugs  (NSAIDS), such as ibuprofen and naproxen. These drugs are used to treat pain and reduce fevers. Asthma attacks caused by any of these medicines can be severe and even fatal. These drugs must be avoided in people who have known aspirin sensitive asthma. Products with acetaminophen are considered safe for people who have asthma. It is important that people with aspirin sensitivity read labels of all over-the-counter drugs used to treat pain, colds, coughs, and fever.   Beta blockers and ACE inhibitors are other drugs which you should discuss with your caregiver, in relation to your asthma.  ALLERGY SKIN TESTING  Ask your asthma caregiver about allergy skin testing or blood testing (RAST test) to identify the allergens to which you are sensitive. If you are found to have allergies, allergy shots (immunotherapy) for asthma may help prevent future allergies and asthma. With allergy shots, small doses of allergens (substances to which you are allergic) are injected under your skin on a regular schedule. Over a period of time, your body may become used to the allergen and less responsive with asthma symptoms. You can also take measures to minimize your exposure to those allergens. EXERCISE  If you have exercise-induced asthma, or are planning vigorous exercise, or exercise in cold, humid, or dry environments, prevent exercise-induced asthma by following your caregiver's advice regarding asthma treatment before exercising. Document Released: 03/21/2009 Document Revised: 03/22/2011 Document Reviewed: 03/21/2009 [employer] Patient Information 2012 [employer]. 

## 2011-09-10 NOTE — ED Notes (Signed)
Pt has a hx of OSA and uses a CPAP machine but states that she has an inhaler (Albuterol) but woke up with some wheezes. Pt states that her children also have HHN which she used but cant get relief. Pt appears to be in no respiratory distress.

## 2011-09-10 NOTE — ED Notes (Signed)
Pt has hx of asthma- woke up yesterday evening with SOB- states heard herself wheezing- has used inhaler and neb without relief- uses cpap at night

## 2011-09-10 NOTE — ED Provider Notes (Signed)
History  This chart was scribed for Cheyenne Shi, MD by Cherlynn Perches. The patient was seen in room MH05/MH05. Patient's care was started at 74.  CSN: [id number]  Arrival date & time 09/10/11  6440   First MD Initiated Contact with Patient 09/10/11 2002      Chief Complaint  Patient presents with  . Cough  . Wheezing     HPI  Cheyenne Arias is a 36 y.o. female with a h/o asthma who presents to the Emergency Department complaining of 24 hours of sudden onset, moderate to severe cough with associated SOB and wheezing. Pt states that cough is productive with clear sputum. Pt states that she has had a cough for a few weeks and woke up last night with greatly worsened symptoms. Pt states that she has used her inhaler 12 hours ago and nebulizer 4 hours ago without relief. Pt denies fever, chest pain, and chills. Pt denies smoking and alcohol use.     Past Medical History  Diagnosis Date  . Hypertension   . Migraine   . Anxiety   . Abnormal Pap smear   . Anemia   . Fibroids   . Fibromyalgia     degenerative disc disease  . Depression   . Sleep apnea     Past Surgical History  Procedure Date  . Wisdom tooth extraction   . Mandible surgery   . Carpal tunnel release   . Cesarean section     1994, 1996     Family History  Problem Relation Age of Onset  . Sickle cell trait Maternal Aunt   . Sickle cell anemia Other     History  Substance Use Topics  . Smoking status: Never Smoker   . Smokeless tobacco: Never Used  . Alcohol Use: No    OB History    Grav Para Term Preterm Abortions TAB SAB Ect Mult Living   3 2 2       2       Review of Systems  All other systems reviewed and are negative.    Allergies  Latex and Morphine and related  Home Medications   Current Outpatient Rx  Name Route Sig Dispense Refill  . AMOXICILLIN 500 MG PO CAPS Oral Take 2 capsules (1,000 mg total) by mouth 3 (three) times daily. 30 capsule 0  . CYCLOBENZAPRINE HCL 10 MG  PO TABS Oral Take 10 mg by mouth 3 (three) times daily as needed.    [name] DIPHENHYDRAMINE-APAP (SLEEP) 25-500 MG PO TABS Oral Take 1 tablet by mouth at bedtime as needed. Takes for sleep    . LABETALOL HCL 100 MG PO TABS Oral Take 1 tablet (100 mg total) by mouth 2 (two) times daily. 60 tablet 11  . MULTI-VITAMIN GUMMIES PO CHEW Oral Chew 1 tablet by mouth daily.    . OXYCODONE-ACETAMINOPHEN 5-325 MG PO TABS Oral Take 1 tablet by mouth every 8 (eight) hours as needed.    . IRON DEXTRAN IVPB Intravenous Inject into the vein every 3 (three) months.      Triage Vitals: BP 141/74  Pulse 121  Temp 98.4 F (36.9 C)  Resp 24  Ht 5\' 2"  (1.575 m)  Wt 270 lb (122.471 kg)  BMI 49.38 kg/m2  SpO2 100%  LMP 02/19/2011  Physical Exam  Nursing note and vitals reviewed. Constitutional: She is oriented to person, place, and time. She appears well-developed and well-nourished. No distress.  HENT:  Head: Normocephalic and atraumatic.  Eyes:  Pupils are equal, round, and reactive to light.  Neck: Normal range of motion.  Cardiovascular: Normal rate and intact distal pulses.   Pulmonary/Chest: No respiratory distress. She has wheezes (expiratory wheezes in all fields).  Abdominal: Normal appearance. She exhibits no distension.  Musculoskeletal: Normal range of motion.  Neurological: She is alert and oriented to person, place, and time. No cranial nerve deficit.  Skin: Skin is warm and dry. No rash noted.  Psychiatric: She has a normal mood and affect. Her behavior is normal.    ED Course  Procedures (including critical care time) Scheduled Meds:   . albuterol  5 mg Nebulization Once  . ipratropium  0.5 mg Nebulization Once  . predniSONE  60 mg Oral Once   Continuous Infusions:  PRN Meds:.albuterol  DIAGNOSTIC STUDIES: Oxygen Saturation is 100% on room air, normal by my interpretation.    COORDINATION OF CARE: 8:10PM - Will give breathing treatment. Pt agrees with plan.    Labs Reviewed -  No data to display No results found.   No diagnosis found.    MDM  I personally performed the services described in this documentation, which was scribed in my presence. The recorded information has been reviewed and considered.   After treatment in the ED the patient feels back to baseline and wants to go home.   Cheyenne Shi, MD 09/10/11 2112

## 2011-09-12 ENCOUNTER — Inpatient Hospital Stay (HOSPITAL_COMMUNITY)
Admission: AD | Admit: 2011-09-12 | Discharge: 2011-09-12 | Disposition: A | Discharge status: Home / Self Care | Payer: Medicaid Other | Source: Ambulatory Visit | Attending: Obstetrics and Gynecology | Admitting: Obstetrics and Gynecology

## 2011-09-12 ENCOUNTER — Encounter (HOSPITAL_COMMUNITY): Payer: Self-pay | Admitting: *Deleted

## 2011-09-12 DIAGNOSIS — O10019 Pre-existing essential hypertension complicating pregnancy, unspecified trimester: Secondary | ICD-10-CM | POA: Insufficient documentation

## 2011-09-12 DIAGNOSIS — R0602 Shortness of breath: Secondary | ICD-10-CM | POA: Insufficient documentation

## 2011-09-12 DIAGNOSIS — O3421 Maternal care for scar from previous cesarean delivery: Secondary | ICD-10-CM

## 2011-09-12 DIAGNOSIS — O99210 Obesity complicating pregnancy, unspecified trimester: Secondary | ICD-10-CM | POA: Insufficient documentation

## 2011-09-12 DIAGNOSIS — J45909 Unspecified asthma, uncomplicated: Secondary | ICD-10-CM

## 2011-09-12 DIAGNOSIS — O34219 Maternal care for unspecified type scar from previous cesarean delivery: Secondary | ICD-10-CM

## 2011-09-12 DIAGNOSIS — E669 Obesity, unspecified: Secondary | ICD-10-CM | POA: Insufficient documentation

## 2011-09-12 MED ORDER — ALBUTEROL SULFATE (5 MG/ML) 0.5% IN NEBU
2.5000 mg | INHALATION_SOLUTION | Freq: Once | RESPIRATORY_TRACT | Status: AC
  Administered 2011-09-12: 2.5 mg via RESPIRATORY_TRACT
  Filled 2011-09-12: qty 0.5

## 2011-09-12 MED ORDER — IPRATROPIUM BROMIDE 0.02 % IN SOLN
0.5000 mg | Freq: Once | RESPIRATORY_TRACT | Status: AC
  Administered 2011-09-12: 0.5 mg via RESPIRATORY_TRACT

## 2011-09-12 MED ORDER — IPRATROPIUM BROMIDE 0.02 % IN SOLN
RESPIRATORY_TRACT | Status: AC
  Administered 2011-09-12: 0.5 mg via RESPIRATORY_TRACT
  Filled 2011-09-12: qty 2.5

## 2011-09-12 NOTE — MAU Provider Note (Signed)
Agree with above note.  Kynnedi Zweig 09/12/2011 7:39 AM   

## 2011-09-12 NOTE — MAU Note (Signed)
Fetal heart rate not tracing due to maternal position to help improve breathing and effectively administer breathing treatment

## 2011-09-12 NOTE — Discharge Instructions (Signed)
Asthma Prevention  Cigarette smoke, house dust, molds, pollens, animal dander, certain insects, exercise, and even cold air are all triggers that can cause an asthma attack. Often, no specific triggers are identified.   Take the following measures around your house to reduce attacks:   Avoid cigarette and other smoke. No smoking should be allowed in a home where someone with asthma lives. If smoking is allowed indoors, it should be done in a room with a closed door, and a window should be opened to clear the air. If possible, do not use a wood-burning stove, kerosene heater, or fireplace. Minimize exposure to all sources of smoke, including incense, candles, fires, and fireworks.   Decrease pollen exposure. Keep your windows shut and use central air during the pollen allergy season. Stay indoors with windows closed from late morning to afternoon, if you can. Avoid mowing the lawn if you have grass pollen allergy. Change your clothes and shower after being outside during this time of year.   Remove molds from bathrooms and wet areas. Do this by cleaning the floors with a fungicide or diluted bleach. Avoid using humidifiers, vaporizers, or swamp coolers. These can spread molds through the air. Fix leaky faucets, pipes, or other sources of water that have mold around them.   Decrease house dust exposure. Do this by using bare floors, vacuuming frequently, and changing furnace and air cooler filters frequently. Avoid using feather, wool, or foam bedding. Use polyester pillows and plastic covers over your mattress. Wash bedding weekly in hot water (hotter than 130 F).   Try to get someone else to vacuum for you once or twice a week, if you can. Stay out of rooms while they are being vacuumed and for a short while afterward. If you vacuum, use a dust mask (from a hardware store), a double-layered or microfilter vacuum cleaner bag, or a vacuum cleaner with a HEPA filter.   Avoid perfumes, talcum powder, hair spray,  paints and other strong odors and fumes.   Keep warm-blooded pets (cats, dogs, rodents, birds) outside the home if they are triggers for asthma. If you can't keep the pet outdoors, keep the pet out of your bedroom and other sleeping areas at all times, and keep the door closed. Remove carpets and furniture covered with cloth from your home. If that is not possible, keep the pet away from fabric-covered furniture and carpets.   Eliminate cockroaches. Keep food and garbage in closed containers. Never leave food out. Use poison baits, traps, powders, gels, or paste (for example, boric acid). If a spray is used to kill cockroaches, stay out of the room until the odor goes away.   Decrease indoor humidity to less than 60%. Use an indoor air cleaning device.   Avoid sulfites in foods and beverages. Do not drink beer or wine or eat dried fruit, processed potatoes, or shrimp if they cause asthma symptoms.   Avoid cold air. Cover your nose and mouth with a scarf on cold or windy days.   Avoid aspirin. This is the most common drug causing serious asthma attacks.   If exercise triggers your asthma, ask your caregiver how you should prepare before exercising. (For example, ask if you could use your inhaler 10 minutes before exercising.)   Avoid close contact with people who have a cold or the flu since your asthma symptoms may get worse if you catch the infection from them. Wash your hands thoroughly after touching items that may have been handled by   others with a respiratory infection.   Get a flu shot every year to protect against the flu virus, which often makes asthma worse for days to weeks. Also get a pneumonia shot once every five to 10 years.  Call your caregiver if you want further information about measures you can take to help prevent asthma attacks.  Document Released: 04/02/2005 Document Revised: 03/22/2011 Document Reviewed: 02/08/2009  [employer] Patient Information 2012 [employer].

## 2011-09-12 NOTE — MAU Provider Note (Signed)
Cheyenne Arias is a 36 y.o. female presenting for eval of shortness of breath. She was seen for the same at [facility] on 5/27 and tx with nebs and rx for prednisone (given 60mg  in ER and then 40mg  x 4d). She is still recovering from a 'sinus inf' for which she received Amox x 7d and feels better from that except for some clear nasal drainage and cough. Tonight she felt her breathing worsen and become more difficult despite her MDI albuterol use. Since 05/2011 she has used CPAP each night but has been unable to use it x 2 weeks now. Denies any preg concerns currently. History OB History    Grav Para Term Preterm Abortions TAB SAB Ect Mult Living   3 2 2       2      Past Medical History  Diagnosis Date  . Hypertension   . Migraine   . Anxiety   . Abnormal Pap smear   . Anemia   . Fibroids   . Fibromyalgia     degenerative disc disease  . Depression   . Sleep apnea   . Asthma    Past Surgical History  Procedure Date  . Wisdom tooth extraction   . Mandible surgery   . Carpal tunnel release   . Cesarean section     1994, 1996    Family History: family history includes Sickle cell anemia in her other and Sickle cell trait in her maternal aunt. Social History:  reports that she has never smoked. She has never used smokeless tobacco. She reports that she does not drink alcohol or use illicit drugs.  ROS    Blood pressure 115/63, pulse 117, temperature 98.6 F (37 C), temperature source Oral, resp. rate 24, height 5' (1.524 m), weight 120.657 kg (266 lb), last menstrual period 02/19/2011, SpO2 98.00%. Maternal Exam:  Uterine Assessment: No ctx per toco     Fetal Exam Fetal Monitor Review: Baseline rate: 140.  Variability: moderate (6-25 bpm).   Due to body habitus, at present there is a combined total of 10 minutes cardio with RN efforts to obtain more; what is seen is approp for GA     Physical Exam  Constitutional: She is oriented to person, place, and time.   obese  HENT:  Head: Normocephalic.  Neck: Normal range of motion.  Respiratory:       Breath sounds decreased in bilat bases but without wheezes (post neb tx)  Musculoskeletal: Normal range of motion. She exhibits edema (bilat LE with 1+ edema).  Neurological: She is alert and oriented to person, place, and time.  Skin: Skin is warm and dry.  Psychiatric: She has a normal mood and affect. Her behavior is normal.    Prenatal labs: ABO, Rh: B/Positive/-- (02/05 0000) Antibody: Negative (02/05 0000) Rubella: Nonimmune (02/05 0000) RPR: NON REAC (05/23 1111)  HBsAg: NEGATIVE (09/19 1425)  HIV: Non-reactive (02/05 0000)  GBS:     Assessment/Plan: IUP at 29.2 CHTN Obesity Asthmatic symptoms s/p sinus infection  Breathing improved after neb tx, but it is questionable how long until symptoms return Encouraged pt to call PCP (Dr Deirdre Peer) in the AM to strongly request a work-in appt ASAP; in the meantime, continue with daily prednisone 40mg  Rev'd with Dr Jolayne Panther- she is in agreement with plan   Cam Hai 09/12/2011, 2:57 AM

## 2011-09-12 NOTE — MAU Note (Signed)
PT SAYS SHE  HAD AN ASTHMA  ATTACK  ON Monday  - FIRST TIME.   SHE W[facility] TO [facility] [address]-   THEY GAVE HER  A TX- AND MEDS  AND RX.  LEFT  AT 930PM ON MOD[place].   AT 0400 ON Tuesday AM  COULDN'T BREATHE- SHE USED HER INHALERS- TILL 0730.    TOOK NEXT DOSE OF MEDS.    THEN AT 11AM- CONTINUED NEBULIZER.    PT COUGHING AND SOB.  TONIGHT CALLED NURSE LINE.

## 2011-09-15 ENCOUNTER — Inpatient Hospital Stay (HOSPITAL_COMMUNITY): Payer: Medicaid Other

## 2011-09-15 ENCOUNTER — Encounter (HOSPITAL_COMMUNITY): Payer: Self-pay | Admitting: Obstetrics and Gynecology

## 2011-09-15 ENCOUNTER — Inpatient Hospital Stay (HOSPITAL_COMMUNITY)
Admission: AD | Admit: 2011-09-15 | Discharge: 2011-09-28 | DRG: 781 | Disposition: A | Discharge status: Home / Self Care | Payer: Medicaid Other | Source: Ambulatory Visit | Attending: Family Medicine | Admitting: Family Medicine

## 2011-09-15 DIAGNOSIS — J9819 Other pulmonary collapse: Secondary | ICD-10-CM | POA: Diagnosis present

## 2011-09-15 DIAGNOSIS — E669 Obesity, unspecified: Secondary | ICD-10-CM | POA: Diagnosis present

## 2011-09-15 DIAGNOSIS — O99519 Diseases of the respiratory system complicating pregnancy, unspecified trimester: Secondary | ICD-10-CM | POA: Diagnosis present

## 2011-09-15 DIAGNOSIS — D649 Anemia, unspecified: Secondary | ICD-10-CM

## 2011-09-15 DIAGNOSIS — J9811 Atelectasis: Secondary | ICD-10-CM | POA: Diagnosis present

## 2011-09-15 DIAGNOSIS — G4733 Obstructive sleep apnea (adult) (pediatric): Secondary | ICD-10-CM | POA: Diagnosis present

## 2011-09-15 DIAGNOSIS — R0902 Hypoxemia: Secondary | ICD-10-CM

## 2011-09-15 DIAGNOSIS — J019 Acute sinusitis, unspecified: Secondary | ICD-10-CM | POA: Diagnosis present

## 2011-09-15 DIAGNOSIS — I152 Hypertension secondary to endocrine disorders: Secondary | ICD-10-CM | POA: Diagnosis present

## 2011-09-15 DIAGNOSIS — IMO0002 Reserved for concepts with insufficient information to code with codable children: Secondary | ICD-10-CM

## 2011-09-15 DIAGNOSIS — O99210 Obesity complicating pregnancy, unspecified trimester: Secondary | ICD-10-CM | POA: Diagnosis present

## 2011-09-15 DIAGNOSIS — R635 Abnormal weight gain: Secondary | ICD-10-CM

## 2011-09-15 DIAGNOSIS — I498 Other specified cardiac arrhythmias: Secondary | ICD-10-CM | POA: Diagnosis present

## 2011-09-15 DIAGNOSIS — D72829 Elevated white blood cell count, unspecified: Secondary | ICD-10-CM | POA: Diagnosis present

## 2011-09-15 DIAGNOSIS — O169 Unspecified maternal hypertension, unspecified trimester: Secondary | ICD-10-CM

## 2011-09-15 DIAGNOSIS — R0609 Other forms of dyspnea: Secondary | ICD-10-CM | POA: Diagnosis present

## 2011-09-15 DIAGNOSIS — J45909 Unspecified asthma, uncomplicated: Secondary | ICD-10-CM

## 2011-09-15 DIAGNOSIS — O10019 Pre-existing essential hypertension complicating pregnancy, unspecified trimester: Secondary | ICD-10-CM | POA: Diagnosis present

## 2011-09-15 DIAGNOSIS — O4690 Antepartum hemorrhage, unspecified, unspecified trimester: Secondary | ICD-10-CM | POA: Diagnosis present

## 2011-09-15 DIAGNOSIS — M797 Fibromyalgia: Secondary | ICD-10-CM

## 2011-09-15 DIAGNOSIS — R0989 Other specified symptoms and signs involving the circulatory and respiratory systems: Secondary | ICD-10-CM | POA: Diagnosis present

## 2011-09-15 DIAGNOSIS — O34219 Maternal care for unspecified type scar from previous cesarean delivery: Secondary | ICD-10-CM

## 2011-09-15 DIAGNOSIS — O368990 Maternal care for other specified fetal problems, unspecified trimester, not applicable or unspecified: Secondary | ICD-10-CM | POA: Diagnosis present

## 2011-09-15 DIAGNOSIS — R609 Edema, unspecified: Secondary | ICD-10-CM

## 2011-09-15 DIAGNOSIS — O09529 Supervision of elderly multigravida, unspecified trimester: Secondary | ICD-10-CM | POA: Diagnosis present

## 2011-09-15 DIAGNOSIS — J984 Other disorders of lung: Secondary | ICD-10-CM | POA: Diagnosis present

## 2011-09-15 DIAGNOSIS — O99891 Other specified diseases and conditions complicating pregnancy: Principal | ICD-10-CM | POA: Diagnosis present

## 2011-09-15 DIAGNOSIS — IMO0001 Reserved for inherently not codable concepts without codable children: Secondary | ICD-10-CM | POA: Diagnosis present

## 2011-09-15 DIAGNOSIS — I10 Essential (primary) hypertension: Secondary | ICD-10-CM

## 2011-09-15 DIAGNOSIS — J45901 Unspecified asthma with (acute) exacerbation: Secondary | ICD-10-CM | POA: Diagnosis present

## 2011-09-15 LAB — CBC
HCT: 31.1 % — ABNORMAL LOW (ref 36.0–46.0)
Hemoglobin: 9.2 g/dL — ABNORMAL LOW (ref 12.0–15.0)
MCH: 19.7 pg — ABNORMAL LOW (ref 26.0–34.0)
MCV: 66.7 fL — ABNORMAL LOW (ref 78.0–100.0)
Platelets: 425 10*3/uL — ABNORMAL HIGH (ref 150–400)
RBC: 4.66 MIL/uL (ref 3.87–5.11)
WBC: 14.5 10*3/uL — ABNORMAL HIGH (ref 4.0–10.5)

## 2011-09-15 LAB — BLOOD GAS, ARTERIAL
Acid-base deficit: 3.4 mmol/L — ABNORMAL HIGH (ref 0.0–2.0)
Drawn by: 138
FIO2: 0.21 %
O2 Saturation: 95 %
pCO2 arterial: 28.7 mmHg — ABNORMAL LOW (ref 35.0–45.0)
pO2, Arterial: 67.7 mmHg — ABNORMAL LOW (ref 80.0–100.0)

## 2011-09-15 MED ORDER — ALBUTEROL SULFATE (5 MG/ML) 0.5% IN NEBU
5.0000 mg | INHALATION_SOLUTION | Freq: Once | RESPIRATORY_TRACT | Status: DC
  Filled 2011-09-15: qty 0.5

## 2011-09-15 MED ORDER — PRENATAL MULTIVITAMIN CH
1.0000 | ORAL_TABLET | Freq: Every day | ORAL | Status: DC
  Filled 2011-09-15: qty 1

## 2011-09-15 MED ORDER — IPRATROPIUM BROMIDE 0.02 % IN SOLN
0.5000 mg | Freq: Once | RESPIRATORY_TRACT | Status: DC
  Filled 2011-09-15: qty 2.5

## 2011-09-15 MED ORDER — METHYLPREDNISOLONE SODIUM SUCC 125 MG IJ SOLR
125.0000 mg | Freq: Four times a day (QID) | INTRAMUSCULAR | Status: DC
  Administered 2011-09-15 – 2011-09-16 (×3): 125 mg via INTRAVENOUS
  Filled 2011-09-15 (×4): qty 2

## 2011-09-15 MED ORDER — POTASSIUM CHLORIDE IN NACL 20-0.45 MEQ/L-% IV SOLN: INTRAVENOUS | Status: DC

## 2011-09-15 MED ORDER — ALBUTEROL SULFATE (5 MG/ML) 0.5% IN NEBU
2.5000 mg | INHALATION_SOLUTION | RESPIRATORY_TRACT | Status: DC
  Administered 2011-09-15 – 2011-09-20 (×30): 2.5 mg via RESPIRATORY_TRACT
  Filled 2011-09-15 (×36): qty 0.5

## 2011-09-15 MED ORDER — IPRATROPIUM BROMIDE 0.02 % IN SOLN
0.5000 mg | RESPIRATORY_TRACT | Status: DC
  Administered 2011-09-15 – 2011-09-20 (×30): 0.5 mg via RESPIRATORY_TRACT
  Filled 2011-09-15 (×36): qty 2.5

## 2011-09-15 MED ORDER — ALBUTEROL SULFATE (5 MG/ML) 0.5% IN NEBU
INHALATION_SOLUTION | RESPIRATORY_TRACT | Status: AC
  Administered 2011-09-15: 2.5 mg via RESPIRATORY_TRACT
  Filled 2011-09-15: qty 1

## 2011-09-15 MED ORDER — ALBUTEROL SULFATE (5 MG/ML) 0.5% IN NEBU
2.5000 mg | INHALATION_SOLUTION | Freq: Once | RESPIRATORY_TRACT | Status: AC
  Administered 2011-09-15: 2.5 mg via RESPIRATORY_TRACT

## 2011-09-15 MED ORDER — LACTATED RINGERS IV SOLN
INTRAVENOUS | Status: DC
  Administered 2011-09-16: 50 mL/h via INTRAVENOUS
  Administered 2011-09-17 (×2): via INTRAVENOUS

## 2011-09-15 MED ORDER — ZOLPIDEM TARTRATE 10 MG PO TABS
10.0000 mg | ORAL_TABLET | Freq: Every evening | ORAL | Status: DC | PRN
  Administered 2011-09-16 – 2011-09-27 (×12): 10 mg via ORAL
  Filled 2011-09-15 (×12): qty 1

## 2011-09-15 MED ORDER — SODIUM CHLORIDE 0.45 % IV SOLN
INTRAVENOUS | Status: DC
  Administered 2011-09-15: 16:00:00 via INTRAVENOUS
  Filled 2011-09-15: qty 1000

## 2011-09-15 MED ORDER — OXYCODONE-ACETAMINOPHEN 5-325 MG PO TABS
1.0000 | ORAL_TABLET | Freq: Three times a day (TID) | ORAL | Status: DC | PRN
  Administered 2011-09-23: 1 via ORAL
  Filled 2011-09-15: qty 1

## 2011-09-15 MED ORDER — IPRATROPIUM BROMIDE 0.02 % IN SOLN
RESPIRATORY_TRACT | Status: AC
  Filled 2011-09-15: qty 2.5

## 2011-09-15 MED ORDER — DOCUSATE SODIUM 100 MG PO CAPS
100.0000 mg | ORAL_CAPSULE | Freq: Every day | ORAL | Status: DC
  Administered 2011-09-15 – 2011-09-28 (×14): 100 mg via ORAL
  Filled 2011-09-15 (×14): qty 1

## 2011-09-15 MED ORDER — METHYLPREDNISOLONE SODIUM SUCC 125 MG IJ SOLR
125.0000 mg | Freq: Once | INTRAMUSCULAR | Status: AC
  Administered 2011-09-15: 125 mg via INTRAVENOUS
  Filled 2011-09-15: qty 2

## 2011-09-15 MED ORDER — CALCIUM CARBONATE ANTACID 500 MG PO CHEW: 2.0000 | CHEWABLE_TABLET | ORAL | Status: DC | PRN

## 2011-09-15 MED ORDER — CEFDINIR 125 MG/5ML PO SUSR: 600.0000 mg | Freq: Every day | ORAL | Status: DC

## 2011-09-15 MED ORDER — PRENATAL MULTIVITAMIN CH
1.0000 | ORAL_TABLET | Freq: Every day | ORAL | Status: DC
  Administered 2011-09-15: 1 via ORAL
  Filled 2011-09-15: qty 1

## 2011-09-15 MED ORDER — LABETALOL HCL 100 MG PO TABS
100.0000 mg | ORAL_TABLET | Freq: Two times a day (BID) | ORAL | Status: DC
  Administered 2011-09-15 – 2011-09-17 (×4): 100 mg via ORAL
  Filled 2011-09-15 (×4): qty 1

## 2011-09-15 MED ORDER — FLUTICASONE-SALMETEROL 250-50 MCG/DOSE IN AEPB
1.0000 | INHALATION_SPRAY | Freq: Two times a day (BID) | RESPIRATORY_TRACT | Status: DC
  Administered 2011-09-15 – 2011-09-19 (×8): 1 via RESPIRATORY_TRACT
  Filled 2011-09-15: qty 14

## 2011-09-15 MED ORDER — SALINE SPRAY 0.65 % NA SOLN
1.0000 | NASAL | Status: DC | PRN
  Administered 2011-09-15 – 2011-09-20 (×8): 1 via NASAL
  Filled 2011-09-15: qty 44

## 2011-09-15 MED ORDER — LACTATED RINGERS IV SOLN: INTRAVENOUS | Status: DC

## 2011-09-15 MED ORDER — CYCLOBENZAPRINE HCL 10 MG PO TABS
10.0000 mg | ORAL_TABLET | Freq: Three times a day (TID) | ORAL | Status: DC | PRN
  Administered 2011-09-16 – 2011-09-27 (×14): 10 mg via ORAL
  Filled 2011-09-15 (×14): qty 1

## 2011-09-15 NOTE — Progress Notes (Signed)
This note also relates to the following rows which could not be included: Pulse Rate - Cannot attach notes to unvalidated device data SpO2 - Cannot attach notes to unvalidated device data W.Mahummad,CNM and respiratory notified to verify orders. Pt advised SOB with respiratory on the way to assess pt and set up O2 therapy.

## 2011-09-15 NOTE — MAU Provider Note (Signed)
History     CSN: [id number]  Arrival date & time 09/15/11  1154   None     Chief Complaint  Patient presents with  . Asthma  . Respiratory Distress    HPI Cheyenne Arias is a 36 y.o. female @ [redacted]w[redacted]d gestation who presents to MAU for shortness of breath. I was called to the room to immediately evaluate the patient. She has had multiple visits the past week for asthma related problems. She has visited the ER's and MAU and is currently taking prednisone 80 mg, using her albuterol inhailer and taking antibiotics. She returns here today stating that she is worse and feels like she can not breath. The history was provided by the patient and her medical record.  Past Medical History  Diagnosis Date  . Hypertension   . Migraine   . Anxiety   . Abnormal Pap smear   . Anemia   . Fibroids   . Fibromyalgia     degenerative disc disease  . Depression   . Sleep apnea   . Asthma     Past Surgical History  Procedure Date  . Wisdom tooth extraction   . Mandible surgery   . Carpal tunnel release   . Cesarean section     1994, 1996     Family History  Problem Relation Age of Onset  . Sickle cell trait Maternal Aunt   . Sickle cell anemia Other     History  Substance Use Topics  . Smoking status: Never Smoker   . Smokeless tobacco: Never Used  . Alcohol Use: No    OB History    Grav Para Term Preterm Abortions TAB SAB Ect Mult Living   3 2 2       2       Review of Systems  Constitutional: Positive for fatigue. Negative for fever, chills and diaphoresis.  HENT: Negative for ear pain, congestion, sore throat, facial swelling, neck pain, neck stiffness, dental problem and sinus pressure.   Eyes: Negative for photophobia, pain and discharge.  Respiratory: Positive for cough, chest tightness, shortness of breath and wheezing.   Cardiovascular: Positive for chest pain and leg swelling.  Gastrointestinal: Negative for nausea, vomiting, abdominal pain, diarrhea, constipation and  abdominal distention.  Genitourinary: Negative for dysuria, frequency, flank pain and difficulty urinating.  Musculoskeletal: Positive for back pain. Negative for myalgias and gait problem.  Skin: Negative for color change and rash.  Neurological: Negative for dizziness, speech difficulty, weakness, light-headedness, numbness and headaches.  Psychiatric/Behavioral: Negative for confusion and agitation.    Allergies  Latex and Morphine and related  Home Medications  No current outpatient prescriptions on file.  142/67 R 35, P 126 O2 Sat 95%  Physical Exam  Nursing note and vitals reviewed. Constitutional: She is oriented to person, place, and time. She appears well-developed and well-nourished. She appears distressed.  HENT:  Head: Normocephalic.  Eyes: EOM are normal.  Neck: Neck supple.  Cardiovascular:       Tachycardia   Pulmonary/Chest: She is in respiratory distress.       Shallow respirations, decreased air movement bilateral. Substernal retracting. Patient appears in mild distress. Sitting bent forward with shallow respirations 35 per minute.  Abdominal: Soft. There is no tenderness.       Gravid consistent with dates.  Musculoskeletal: Normal range of motion.  Neurological: She is alert and oriented to person, place, and time. No cranial nerve deficit.  Skin: Skin is warm and dry.  Psychiatric:  She has a normal mood and affect. Her behavior is normal. Judgment and thought content normal.   IV LR @ 75 cc/hr. Solumedrol 125 mg. IV Albuterol 5mg / Atrovent .5 mg HHN treatment  Albuterol Neb. 2.5 given as second treatment. Results for orders placed during the hospital encounter of 09/15/11 (from the past 24 hour(s))  BLOOD GAS, ARTERIAL     Status: Abnormal   Collection Time   09/15/11 12:30 PM      Component Value Range   FIO2 0.21     Delivery systems ROOM AIR     pH, Arterial 7.445 (*) 7.350 - 7.400    pCO2 arterial 28.7 (*) 35.0 - 45.0 (mmHg)   pO2, Arterial 67.7  (*) 80.0 - 100.0 (mmHg)   Bicarbonate 19.5 (*) 20.0 - 24.0 (mEq/L)   TCO2 20.3  0 - 100 (mmol/L)   Acid-base deficit 3.4 (*) 0.0 - 2.0 (mmol/L)   O2 Saturation 95.0     Collection site RADIAL     Drawn by [id number]     Sample type ARTERIAL     Allens test (pass/fail) PASS  PASS    Dg Chest 2 View  09/15/2011  *RADIOLOGY REPORT*  Clinical Data: Asthma and SOB  CHEST - 2 VIEW  Comparison: 02/16/2011  Findings: The heart size and mediastinal contours are within normal limits.  Both lungs are clear.  The visualized skeletal structures are unremarkable.  IMPRESSION: Negative examination.  Original Report Authenticated By: Rosealee Albee, M.D.   Re evaluation after neb treatment x 2. Air movement has improved but patient continues to feel short of breath. Occasional wheezing heard.  Continues to be tachycardic.   Assessment: Asthma execration @ [redacted]w[redacted]d gestation  Plan:  Discussed with Dr. Shawnie Pons   Admit and continue treatment   Pulmonary consult.    Dr. Shawnie Pons will come to MAU and write orders.   ED Course  Procedures   MDM

## 2011-09-15 NOTE — MAU Note (Signed)
Pt sitting straight up in the bed, having difficulty breathing, fetal monitor adjusted; difficulty to trace at this time.

## 2011-09-15 NOTE — Progress Notes (Signed)
This note also relates to the following rows which could not be included: Pulse Rate - Cannot attach notes to unvalidated device data SpO2 - Cannot attach notes to unvalidated device data Respirtory at bedside.

## 2011-09-15 NOTE — MAU Provider Note (Signed)
Chart reviewed and agree with management and plan.  

## 2011-09-15 NOTE — H&P (Signed)
Chief Complaint   Patient presents with   .  Asthma   .  Respiratory Distress     HPI Cheyenne Arias is a 36 y.o. female @ [redacted]w[redacted]d gestation who presents to MAU for shortness of breath. I was called to the room to immediately evaluate the patient. She has had multiple visits the past week for asthma related problems. She has visited the ER's and MAU and is currently taking prednisone 80 mg, using her albuterol inhailer and taking antibiotics. She returns here today stating that she is worse and feels like she can not breath. The history was provided by the patient and her medical record.    Past Medical History   .  Hypertension     .  Migraine     .  Anxiety     .  Abnormal Pap smear     .  Anemia     .  Fibroids     .  Fibromyalgia         degenerative disc disease   .  Depression     .  Sleep apnea     .  Asthma           Past Surgical History   .  Wisdom tooth extraction     .  Mandible surgery     .  Carpal tunnel release     .  Cesarean section         1994, 1996          Family History   .  Sickle cell trait  Maternal Aunt     .  Sickle cell anemia  Other        Social  History   Substance Use  .  Smoking status:  Never Smoker    .  Smokeless tobacco:  Never Used   .  Alcohol Use:  No     OB History      Grav  Para  Term  Preterm  Abortions  TAB  SAB  Ect  Mult  Living     3   2   2                         2        Review of Systems  Constitutional: Positive for fatigue. Negative for fever, chills and diaphoresis.  HENT: Negative for ear pain, congestion, sore throat, facial swelling, neck pain, neck stiffness, dental problem and sinus pressure.   Eyes: Negative for photophobia, pain and discharge.  Respiratory: Positive for cough, chest tightness, shortness of breath and wheezing.   Cardiovascular: Positive for chest pain and leg swelling.  Gastrointestinal: Negative for nausea, vomiting, abdominal pain, diarrhea, constipation and abdominal  distention.  Genitourinary: Negative for dysuria, frequency, flank pain and difficulty urinating.  Musculoskeletal: Positive for back pain. Negative for myalgias and gait problem.  Skin: Negative for color change and rash.  Neurological: Negative for dizziness, speech difficulty, weakness, light-headedness, numbness and headaches.  Psychiatric/Behavioral: Negative for confusion and agitation.       Allergies    Latex and Morphine and related    Medications No current facility-administered medications on file prior to encounter.   Current Outpatient Prescriptions on File Prior to Encounter  Medication Sig Dispense Refill  . albuterol (PROVENTIL HFA;VENTOLIN HFA) 108 (90 BASE) MCG/ACT inhaler Inhale 2 puffs into the lungs every 6 (six) hours as needed. As needed for asthma/shortness of breath      .  cyclobenzaprine (FLEXERIL) 10 MG tablet Take 10 mg by mouth 3 (three) times daily as needed.      . diphenhydramine-acetaminophen (TYLENOL PM) 25-500 MG TABS Take 1 tablet by mouth at bedtime as needed. Takes for sleep      . labetalol (NORMODYNE) 100 MG tablet Take 1 tablet (100 mg total) by mouth 2 (two) times daily.  60 tablet  11  . Multiple Vitamins-Minerals (MULTI-VITAMIN GUMMIES) CHEW Chew 1 tablet by mouth daily.      [name] oxyCODONE-acetaminophen (PERCOCET) 5-325 MG per tablet Take 1 tablet by mouth every 8 (eight) hours as needed.      . predniSONE (DELTASONE) 20 MG tablet Take 2 tablets (40 mg total) by mouth daily.  10 tablet  0  . sodium chloride 0.9 % SOLN 500 mL with iron dextran complex 50 MG/ML SOLN Inject into the vein every 3 (three) months.        142/67 R 35, P 126 O2 Sat 95%   Physical Exam  Nursing note and vitals reviewed. Constitutional: She is oriented to person, place, and time. She appears well-developed and well-nourished. She appears distressed.  HENT:   Head: Normocephalic.  Eyes: EOM are normal.  Neck: Neck supple.  Cardiovascular:        Tachycardia  Pulmonary/Chest: She is in respiratory distress.       Shallow respirations, decreased air movement bilateral. Substernal retracting. Patient appears in mild distress. Sitting bent forward with shallow respirations 35 per minute.  Abdominal: Soft. There is no tenderness.       Gravid consistent with dates.  Musculoskeletal: Normal range of motion.  Neurological: She is alert and oriented to person, place, and time. No cranial nerve deficit.  Skin: Skin is warm and dry.  Psychiatric: She has a normal mood and affect. Her behavior is normal. Judgment and thought content normal.    Labs BLOOD GAS, ARTERIAL     Status: Abnormal     Collection Time     09/15/11 12:30 PM       Component  Value  Range     FIO2  0.21        Delivery systems  ROOM AIR        pH, Arterial  7.445 (*)  7.350 - 7.400      pCO2 arterial  28.7 (*)  35.0 - 45.0 (mmHg)     pO2, Arterial  67.7 (*)  80.0 - 100.0 (mmHg)     Bicarbonate  19.5 (*)  20.0 - 24.0 (mEq/L)     TCO2  20.3   0 - 100 (mmol/L)     Acid-base deficit  3.4 (*)  0.0 - 2.0 (mmol/L)     O2 Saturation  95.0        Collection site  RADIAL        Drawn by  [id number]        Sample type  ARTERIAL        Allens test (pass/fail)  PASS   PASS       Dg Chest 2 View   09/15/2011  *RADIOLOGY REPORT*  Clinical Data: Asthma and SOB  CHEST - 2 VIEW  Comparison: 02/16/2011  Findings: The heart size and mediastinal contours are within normal limits.  Both lungs are clear.  The visualized skeletal structures are unremarkable.  IMPRESSION: Negative examination.  Original Report Authenticated By: Rosealee Albee, M.D.        Assessment:   Asthma execration @ [redacted]w[redacted]d gestation  Hypoxia   Plan:               Admit and continue treatment                      IV steroids   Nebulizers scheduled

## 2011-09-15 NOTE — MAU Note (Signed)
Pt presents to MAU with chief complaint of shortness of breath. Pt is [redacted]w[redacted]d, G3P2 history of Asthma; multiple visits to ER in the last week due to exacerbation.

## 2011-09-16 DIAGNOSIS — J45901 Unspecified asthma with (acute) exacerbation: Secondary | ICD-10-CM

## 2011-09-16 DIAGNOSIS — O3421 Maternal care for scar from previous cesarean delivery: Secondary | ICD-10-CM

## 2011-09-16 LAB — BLOOD GAS, ARTERIAL
Drawn by: 291651
TCO2: 20.7 mmol/L (ref 0–100)
pCO2 arterial: 29.6 mmHg — ABNORMAL LOW (ref 35.0–45.0)
pH, Arterial: 7.44 — ABNORMAL HIGH (ref 7.350–7.400)
pO2, Arterial: 86.7 mmHg (ref 80.0–100.0)

## 2011-09-16 MED ORDER — CEFDINIR 300 MG PO CAPS
600.0000 mg | ORAL_CAPSULE | Freq: Every day | ORAL | Status: DC
  Administered 2011-09-16 – 2011-09-19 (×4): 600 mg via ORAL

## 2011-09-16 MED ORDER — COMPLETENATE 29-1 MG PO CHEW
1.0000 | CHEWABLE_TABLET | Freq: Every day | ORAL | Status: DC
  Administered 2011-09-17 – 2011-09-28 (×12): 1 via ORAL
  Filled 2011-09-16 (×14): qty 1

## 2011-09-16 MED ORDER — LORATADINE 10 MG PO TABS
10.0000 mg | ORAL_TABLET | Freq: Every day | ORAL | Status: DC
  Administered 2011-09-16 – 2011-09-28 (×13): 10 mg via ORAL
  Filled 2011-09-16 (×14): qty 1

## 2011-09-16 MED ORDER — METHYLPREDNISOLONE SODIUM SUCC 125 MG IJ SOLR
125.0000 mg | Freq: Three times a day (TID) | INTRAMUSCULAR | Status: DC
  Administered 2011-09-16 – 2011-09-19 (×10): 125 mg via INTRAVENOUS
  Filled 2011-09-16 (×14): qty 2

## 2011-09-16 MED ORDER — OXYMETAZOLINE HCL 0.05 % NA SOLN
2.0000 | Freq: Two times a day (BID) | NASAL | Status: AC | PRN
  Administered 2011-09-16 – 2011-09-19 (×5): 2 via NASAL
  Filled 2011-09-16: qty 15

## 2011-09-16 NOTE — Progress Notes (Signed)
Patient ID: Cheyenne Arias, female   DOB: 1975/06/23, 36 y.o.   MRN: [id number] FACULTY PRACTICE ANTEPARTUM(COMPREHENSIVE) NOTE  Cheyenne Arias is a 36 y.o. G3P2002 at [redacted]w[redacted]d by best clinical estimate who is admitted for acute asthma exacerbation.   Fetal presentation is unsure. Length of Stay:  1  Days  Subjective: Feels like her breathing is somewhat better but is still feeling tight. Patient reports the fetal movement as active. Patient reports uterine contraction  activity as none. Patient reports  vaginal bleeding as none. Patient describes fluid per vagina as None.  Vitals:  Blood pressure 131/68, pulse 115, temperature 98.6 F (37 C), temperature source Oral, resp. rate 28, last menstrual period 02/19/2011, SpO2 95.00%. Physical Examination:  General appearance - overweight and tachypnic, able to complete sentences today Chest - wheezing noted but improved air movement Heart - tachy Abdomen - soft, nontender, nondistended, no masses, gravid Fundal Height:  size equals dates Extremities: extremities normal, atraumatic, no cyanosis or edema and Homans sign is negative, no sign of DVT    Fetal Monitoring:  Baseline: 140 bpm, Variability: Good {> 6 bpm), Accelerations: Reactive and Decelerations: Absent  Labs:  Recent Results (from the past 24 hour(s))  BLOOD GAS, ARTERIAL   Collection Time   09/15/11 12:30 PM      Component Value Range   FIO2 0.21     Delivery systems ROOM AIR     pH, Arterial 7.445 (*) 7.350 - 7.400    pCO2 arterial 28.7 (*) 35.0 - 45.0 (mmHg)   pO2, Arterial 67.7 (*) 80.0 - 100.0 (mmHg)   Bicarbonate 19.5 (*) 20.0 - 24.0 (mEq/L)   TCO2 20.3  0 - 100 (mmol/L)   Acid-base deficit 3.4 (*) 0.0 - 2.0 (mmol/L)   O2 Saturation 95.0     Collection site RADIAL     Drawn by [id number]     Sample type ARTERIAL     Allens test (pass/fail) PASS  PASS   CBC   Collection Time   09/15/11  3:37 PM      Component Value Range   WBC 14.5 (*) 4.0 - 10.5 (K/uL)   RBC 4.66   3.87 - 5.11 (MIL/uL)   Hemoglobin 9.2 (*) 12.0 - 15.0 (g/dL)   HCT 40.9 (*) 81.1 - 46.0 (%)   MCV 66.7 (*) 78.0 - 100.0 (fL)   MCH 19.7 (*) 26.0 - 34.0 (pg)   MCHC 29.6 (*) 30.0 - 36.0 (g/dL)   RDW 91.4 (*) 78.2 - 15.5 (%)   Platelets 425 (*) 150 - 400 (K/uL)  BLOOD GAS, ARTERIAL   Collection Time   09/16/11  6:45 AM      Component Value Range   FIO2 0.21     Delivery systems  ROOM AIR     pH, Arterial 7.440 (*) 7.350 - 7.400    pCO2 arterial 29.6 (*) 35.0 - 45.0 (mmHg)   pO2, Arterial 86.7  80.0 - 100.0 (mmHg)   Bicarbonate 19.8 (*) 20.0 - 24.0 (mEq/L)   TCO2 20.7  0 - 100 (mmol/L)   Acid-base deficit 2.7 (*) 0.0 - 2.0 (mmol/L)   O2 Saturation 92.0     Collection site LEFT RADIAL     Drawn by [id number]     Sample type ARTERIAL     Allens test (pass/fail) PASS  PASS     Medications:  Scheduled    . albuterol  2.5 mg Nebulization Once  . albuterol  2.5 mg Nebulization Q4H  . cefdinir  600 mg Oral Daily  . docusate sodium  100 mg Oral Daily  . Fluticasone-Salmeterol  1 puff Inhalation BID  . ipratropium      . ipratropium  0.5 mg Nebulization Q4H  . labetalol  100 mg Oral BID  . methylPREDNISolone (SOLU-MEDROL) injection  125 mg Intravenous Once  . methylPREDNISolone sodium succinate  125 mg Intravenous Q6H  . prenatal multivitamin  1 tablet Oral Daily  . DISCONTD: albuterol  5 mg Nebulization Once  . DISCONTD: cefdinir  600 mg Oral Daily  . DISCONTD: ipratropium  0.5 mg Nebulization Once  . DISCONTD: prenatal multivitamin  1 tablet Oral Daily   I have reviewed the patient's current medications.  ASSESSMENT: Patient Active Problem List  Diagnoses  . Bleeding in early pregnancy  . Fibromyalgia  . Low back pain  . AMA (advanced maternal age) multigravida 35+  . Obesity  . Viral URI  . Pelvic relaxation  . History of urinary retention  . Hx of dysmenorrhea  . Fibroids  . Anemia  . Hypertension complicating pregnancy, childbirth and puerperium, antepartum  . Benign  essential HTN  . Supervision of high-risk pregnancy  . ASCUS with positive high risk HPV  . History of cesarean delivery, currently pregnant    PLAN: Add anti-histamine CPAP Continue steroids and scheduled nebs.  Ellis Mehaffey S 09/16/2011,7:06 AM

## 2011-09-16 NOTE — Progress Notes (Signed)
Dr Adrian Blackwater notified of FHR tracing now vs tracing this AM. Reviewed tracing, will continue to monitor x20 additional minutes.

## 2011-09-16 NOTE — Progress Notes (Signed)
Pt has brought in home cpap machine. RT came to place o2 connector on cpap to run 2L o2 for pt. Pt knows how to use machine and needed no further assistance. RT will monitor.

## 2011-09-17 ENCOUNTER — Inpatient Hospital Stay (HOSPITAL_COMMUNITY): Payer: Medicaid Other

## 2011-09-17 DIAGNOSIS — O34219 Maternal care for unspecified type scar from previous cesarean delivery: Secondary | ICD-10-CM

## 2011-09-17 DIAGNOSIS — O09899 Supervision of other high risk pregnancies, unspecified trimester: Secondary | ICD-10-CM

## 2011-09-17 DIAGNOSIS — J45909 Unspecified asthma, uncomplicated: Secondary | ICD-10-CM

## 2011-09-17 DIAGNOSIS — O99891 Other specified diseases and conditions complicating pregnancy: Principal | ICD-10-CM

## 2011-09-17 DIAGNOSIS — O09529 Supervision of elderly multigravida, unspecified trimester: Secondary | ICD-10-CM

## 2011-09-17 MED ORDER — LACTATED RINGERS IV BOLUS (SEPSIS)
500.0000 mL | Freq: Once | INTRAVENOUS | Status: AC
  Administered 2011-09-17: 500 mL via INTRAVENOUS

## 2011-09-17 MED ORDER — MAGNESIUM HYDROXIDE 400 MG/5ML PO SUSP
30.0000 mL | Freq: Every day | ORAL | Status: DC | PRN
  Administered 2011-09-17 – 2011-09-26 (×6): 30 mL via ORAL
  Filled 2011-09-17 (×7): qty 30

## 2011-09-17 MED ORDER — AMLODIPINE BESYLATE 10 MG PO TABS
10.0000 mg | ORAL_TABLET | Freq: Every day | ORAL | Status: DC
  Administered 2011-09-17 – 2011-09-28 (×12): 10 mg via ORAL
  Filled 2011-09-17 (×13): qty 1

## 2011-09-17 NOTE — Progress Notes (Signed)
Ur chart review completed.  

## 2011-09-17 NOTE — Progress Notes (Signed)
Notified by nursing of mild variability in FHT this evening.  BPP ordered with results of 2/8 with minimal movement and tone.  Fluid bolus, continuous monitoring with repeat BPP in morning ordered.  Dr Jolayne Panther notified of situation, who agreed with plan.  Also went and discussed plan with patient and patient's mother.  [name] upset that BPP would not be preformed sooner.  Explained to her that sooner BPP would not give any additional information.  [name] also concerned regarding minimal variability to [facility] and that we were not doing anything regarding the tracing, such as moving toward delivery of baby.  I acknowledged their concerns and explained that delivery of the baby was not indicated.  I explained that continuous monitoring of the baby's heart rate with a repeat of the BPP was the appropriate management at this time.  Patient reassured.  Cheyenne Heritage, DO 09/17/2011 2:13 AM

## 2011-09-17 NOTE — Progress Notes (Signed)
FACULTY PRACTICE ANTEPARTUM(COMPREHENSIVE) NOTE  Cheyenne Arias is a 36 y.o. G3P2002 at [redacted]w[redacted]d by best clinical estimate who is admitted for acute asthma exacerbation.   Fetal presentation is cephalic Length of Stay:  2  Days  Subjective: Feels like her breathing is somewhat better but is still feeling tight and working to breathe. Had BPP today that was 8/8, compared to yesterday's BPP of 2/8.  Good fetal movement perceived by patient, Patient reports the fetal movement as active. Patient reports uterine contraction  activity as none. Patient reports  vaginal bleeding as none. Patient describes fluid per vagina as None.  Vitals:  Blood pressure 150/74, pulse 117, temperature 98.5 F (36.9 C), temperature source Axillary, resp. rate 36, height 5' (1.524 m), weight 266 lb (120.657 kg), last menstrual period 02/19/2011, SpO2 95.00%. Physical Examination: General appearance - overweight and tachypnic, able to complete sentences today Chest - wheezing noted but improved air movement Heart - tachycardic Abdomen - soft, nontender, nondistended, no masses, gravid Fundal Height:  size equals dates Extremities: extremities normal, atraumatic, no cyanosis or edema and Homans sign is negative, no sign of DVT   Fetal Monitoring:  Baseline: 145 bpm, Variability: moderate, accelerations: present and decelerations: absent  Labs:  No results found for this or any previous visit (from the past 24 hour(s)).  Medications:  Scheduled    . albuterol  2.5 mg Nebulization Q4H  . cefdinir  600 mg Oral Daily  . docusate sodium  100 mg Oral Daily  . Fluticasone-Salmeterol  1 puff Inhalation BID  . ipratropium  0.5 mg Nebulization Q4H  . labetalol  100 mg Oral BID  . lactated ringers  500 mL Intravenous Once  . loratadine  10 mg Oral Daily  . methylPREDNISolone sodium succinate  125 mg Intravenous Q8H  . prenatal vitamin w/FE, FA  1 tablet Oral Daily  . DISCONTD: prenatal multivitamin  1 tablet Oral  Daily   I have reviewed the patient's current medications.  ASSESSMENT: Patient Active Problem List  Diagnoses  . Bleeding in early pregnancy  . Fibromyalgia  . Low back pain  . AMA (advanced maternal age) multigravida 35+  . Obesity  . Viral URI  . Pelvic relaxation  . History of urinary retention  . Hx of dysmenorrhea  . Fibroids  . Anemia  . Hypertension complicating pregnancy, childbirth and puerperium, antepartum  . Benign essential HTN  . Supervision of high-risk pregnancy  . ASCUS with positive high risk HPV  . History of cesarean delivery, currently pregnant    PLAN: Switch antihypertensive therapy to Norvasc Add anti-histamine, continue CPAP Continue steroids and scheduled nebs.  Ambulate as tolerated. Continue twice a day NST; reassured by BPP 8/8 today Routine antenatal care  [name] A 09/17/2011,10:23 AM

## 2011-09-18 DIAGNOSIS — J4530 Mild persistent asthma, uncomplicated: Secondary | ICD-10-CM | POA: Insufficient documentation

## 2011-09-18 DIAGNOSIS — J45998 Other asthma: Secondary | ICD-10-CM | POA: Insufficient documentation

## 2011-09-18 DIAGNOSIS — J45909 Unspecified asthma, uncomplicated: Secondary | ICD-10-CM | POA: Diagnosis present

## 2011-09-18 NOTE — Progress Notes (Signed)
09/18/11 1500  Clinical Encounter Type  Visited With Patient;Health care provider (RN Erskine Squibb)  Visit Type Initial    Made very brief initial visit to introduce chaplain services and availability.  Pt involved in phone call, but receptive and welcoming.  [address] [place], [place] [phone]

## 2011-09-18 NOTE — Progress Notes (Signed)
Patient ID: Cheyenne Arias, female   DOB: 1975-05-01, 36 y.o.   MRN: [id number] FACULTY PRACTICE ANTEPARTUM(COMPREHENSIVE) NOTE  Cheyenne Arias is a 36 y.o. G3P2002 at [redacted]w[redacted]d by best clinical estimate who is admitted for acute asthma exacerbation.   Fetal presentation is cephalic Length of Stay:  3  Days  Subjective: Feels like she is still working to breathe, cannot go more than a few minutes without oxygen by [place]. Patient reports the fetal movement as active. Patient reports uterine contraction  activity as none. Patient reports  vaginal bleeding as none. Patient describes fluid per vagina as None.  Vitals:  Blood pressure 132/61, pulse 122, temperature 98.2 F (36.8 C), temperature source Axillary, resp. rate 24, height 5' (1.524 m), weight 120.657 kg (266 lb), last menstrual period 02/19/2011, SpO2 97.00%. Physical Examination: General appearance - overweight and tachypnic, able to complete sentences today Chest - wheezing noted but good air movement Heart - tachycardic Abdomen - soft, nontender, nondistended, no masses, gravid Fundal Height:  size equals dates Extremities: extremities normal, atraumatic, no cyanosis or edema and Homans sign is negative, no sign of DVT   Fetal Monitoring:  Baseline: 145 bpm, Variability: moderate, accelerations: present and decelerations: absent  Labs:  No results found for this or any previous visit (from the past 24 hour(s)).  Medications:  Scheduled    . albuterol  2.5 mg Nebulization Q4H  . amLODipine  10 mg Oral Daily  . cefdinir  600 mg Oral Daily  . docusate sodium  100 mg Oral Daily  . Fluticasone-Salmeterol  1 puff Inhalation BID  . ipratropium  0.5 mg Nebulization Q4H  . loratadine  10 mg Oral Daily  . methylPREDNISolone sodium succinate  125 mg Intravenous Q8H  . prenatal vitamin w/FE, FA  1 tablet Oral Daily  . DISCONTD: labetalol  100 mg Oral BID   I have reviewed the patient's current medications.  ASSESSMENT: Patient  Active Problem List  Diagnoses  . Bleeding in early pregnancy  . Fibromyalgia  . Low back pain  . AMA (advanced maternal age) multigravida 35+  . Obesity  . Viral URI  . Pelvic relaxation  . History of urinary retention  . Hx of dysmenorrhea  . Fibroids  . Anemia  . Hypertension complicating pregnancy, childbirth and puerperium, antepartum  . Benign essential HTN  . Supervision of high-risk pregnancy  . ASCUS with positive high risk HPV  . History of cesarean delivery, currently pregnant  . Asthma complicating pregnancy, antepartum  . Acute asthma exacerbation    PLAN: Continue steroids, respiratory care and scheduled nebs. Consider medicine or pulmonary consult if patient does not improve Ambulate as tolerated. Continue Norvasc Routine antenatal care  Lela Murfin A 09/18/2011,8:00 AM

## 2011-09-19 ENCOUNTER — Inpatient Hospital Stay (HOSPITAL_COMMUNITY): Payer: Medicaid Other

## 2011-09-19 ENCOUNTER — Telehealth: Payer: Self-pay

## 2011-09-19 DIAGNOSIS — J9819 Other pulmonary collapse: Secondary | ICD-10-CM

## 2011-09-19 DIAGNOSIS — J984 Other disorders of lung: Secondary | ICD-10-CM

## 2011-09-19 DIAGNOSIS — R609 Edema, unspecified: Secondary | ICD-10-CM

## 2011-09-19 DIAGNOSIS — J019 Acute sinusitis, unspecified: Secondary | ICD-10-CM | POA: Diagnosis present

## 2011-09-19 DIAGNOSIS — R635 Abnormal weight gain: Secondary | ICD-10-CM | POA: Diagnosis present

## 2011-09-19 DIAGNOSIS — I152 Hypertension secondary to endocrine disorders: Secondary | ICD-10-CM | POA: Diagnosis present

## 2011-09-19 DIAGNOSIS — O9989 Other specified diseases and conditions complicating pregnancy, childbirth and the puerperium: Secondary | ICD-10-CM

## 2011-09-19 DIAGNOSIS — G4733 Obstructive sleep apnea (adult) (pediatric): Secondary | ICD-10-CM | POA: Diagnosis present

## 2011-09-19 DIAGNOSIS — R0902 Hypoxemia: Secondary | ICD-10-CM

## 2011-09-19 DIAGNOSIS — I10 Essential (primary) hypertension: Secondary | ICD-10-CM

## 2011-09-19 DIAGNOSIS — J45901 Unspecified asthma with (acute) exacerbation: Secondary | ICD-10-CM

## 2011-09-19 DIAGNOSIS — J9811 Atelectasis: Secondary | ICD-10-CM | POA: Diagnosis present

## 2011-09-19 MED ORDER — DEXTROSE 5 % IV SOLN
1.0000 g | Freq: Two times a day (BID) | INTRAVENOUS | Status: DC
  Administered 2011-09-19 – 2011-09-20 (×3): 1 g via INTRAVENOUS
  Filled 2011-09-19 (×4): qty 10

## 2011-09-19 MED ORDER — AZITHROMYCIN 500 MG PO TABS
500.0000 mg | ORAL_TABLET | Freq: Every day | ORAL | Status: DC
  Administered 2011-09-19 – 2011-09-20 (×2): 500 mg via ORAL
  Filled 2011-09-19 (×3): qty 1

## 2011-09-19 MED ORDER — BUDESONIDE 0.5 MG/2ML IN SUSP
0.5000 mg | Freq: Two times a day (BID) | RESPIRATORY_TRACT | Status: DC
  Administered 2011-09-19 – 2011-09-28 (×18): 0.5 mg via RESPIRATORY_TRACT
  Filled 2011-09-19 (×21): qty 2

## 2011-09-19 MED ORDER — FLUTICASONE PROPIONATE 50 MCG/ACT NA SUSP
2.0000 | Freq: Every day | NASAL | Status: DC
  Administered 2011-09-19 – 2011-09-20 (×2): 2 via NASAL
  Filled 2011-09-19: qty 16

## 2011-09-19 MED ORDER — SODIUM CHLORIDE 0.9 % IV SOLN
INTRAVENOUS | Status: DC
  Administered 2011-09-19: 30 mL/h via INTRAVENOUS
  Administered 2011-09-19: 20 mL/h via INTRAVENOUS
  Administered 2011-09-21 – 2011-09-23 (×2): via INTRAVENOUS

## 2011-09-19 MED ORDER — FUROSEMIDE 10 MG/ML IJ SOLN
40.0000 mg | Freq: Once | INTRAMUSCULAR | Status: AC
  Administered 2011-09-19: 40 mg via INTRAVENOUS
  Filled 2011-09-19: qty 4

## 2011-09-19 MED ORDER — METHYLPREDNISOLONE SODIUM SUCC 40 MG IJ SOLR
40.0000 mg | Freq: Two times a day (BID) | INTRAMUSCULAR | Status: DC
  Administered 2011-09-19 – 2011-09-20 (×2): 40 mg via INTRAVENOUS
  Filled 2011-09-19: qty 0.64
  Filled 2011-09-19 (×2): qty 1
  Filled 2011-09-19 (×2): qty 0.64

## 2011-09-19 MED ORDER — SODIUM CHLORIDE 0.9 % IJ SOLN
3.0000 mL | Freq: Two times a day (BID) | INTRAMUSCULAR | Status: DC
  Administered 2011-09-19: 3 mL via INTRAVENOUS

## 2011-09-19 NOTE — Progress Notes (Signed)
Patient ID: Cheyenne Arias, female   DOB: 1975-04-25, 36 y.o.   MRN: [id number]  Called by RN due to concern for fetal monitoring.  Strip reviewed with D. Poe, CNM, who saw periods of moderate variability alternating with periods of less variability, consistent with sleep cycles, some variable decels.   Plan: continue monitoring.  She will discuss patient with Dr. Emelda Fear

## 2011-09-19 NOTE — Progress Notes (Signed)
Patient requires SBA when ambulating due to SOB

## 2011-09-19 NOTE — Progress Notes (Signed)
Patient ID: Cheyenne Arias, female   DOB: Nov 17, 1975, 36 y.o.   MRN: [id number] FACULTY PRACTICE ANTEPARTUM(COMPREHENSIVE) NOTE  Cheyenne Arias is a 36 y.o. G3P2002 at [redacted]w[redacted]d by LMP who is admitted for asthma.   Fetal presentation is unsure. Length of Stay:  4  Days  Subjective: SOB, increased with mild exertion  Patient reports the fetal movement as active. Patient reports uterine contraction  activity as none. Patient reports  vaginal bleeding as none. Patient describes fluid per vagina as None.  Vitals:  Blood pressure 119/48, pulse 118, temperature 98 F (36.7 C), temperature source Oral, resp. rate 22, height 5' (1.524 m), weight 120.657 kg (266 lb), last menstrual period 02/19/2011, SpO2 99.00%. Physical Examination:  General appearance - alert, mildly anxious and some respiratory discomfort Heart - normal rate and regular rhythm Chest scattered wheezes Abdomen - soft, nontender, nondistended Fundal Height:  size equals dates Cervical Exam: Not evaluated. and found to be not evaluated/ / and fetal presentation is unsure. Extremities: extremities normal, atraumatic, no cyanosis or edema and Homans sign is negative, no sign of DVT with DTRs 2+ bilaterally Membranes:intact  Fetal Monitoring:  150, unable to have continuous tracing. BPP 8/8 6/3  Labs:  No results found for this or any previous visit (from the past 24 hour(s)).  Imaging Studies:    Currently EPIC will not allow sonographic studies to automatically populate into notes.  In the meantime, copy and paste results into note or free text.  Medications:  Scheduled    . albuterol  2.5 mg Nebulization Q4H  . amLODipine  10 mg Oral Daily  . cefdinir  600 mg Oral Daily  . docusate sodium  100 mg Oral Daily  . Fluticasone-Salmeterol  1 puff Inhalation BID  . ipratropium  0.5 mg Nebulization Q4H  . loratadine  10 mg Oral Daily  . methylPREDNISolone sodium succinate  125 mg Intravenous Q8H  . prenatal vitamin w/FE, FA   1 tablet Oral Daily   I have reviewed the patient's current medications.  ASSESSMENT: Patient Active Problem List  Diagnoses  . Bleeding in early pregnancy  . Fibromyalgia  . Low back pain  . AMA (advanced maternal age) multigravida 35+  . Obesity  . Viral URI  . Pelvic relaxation  . History of urinary retention  . Hx of dysmenorrhea  . Fibroids  . Anemia  . Hypertension complicating pregnancy, childbirth and puerperium, antepartum  . Benign essential HTN  . Supervision of high-risk pregnancy  . ASCUS with positive high risk HPV  . History of cesarean delivery, currently pregnant  . Asthma complicating pregnancy, antepartum  . Acute asthma exacerbation    PLAN: Pulmonary consult  Cheyenne Arias 09/19/2011,7:41 AM

## 2011-09-19 NOTE — Telephone Encounter (Signed)
Called pt and pt informed me as what is stated below.  Per Maylon Cos, we can not see her if she is admitted to hospital.  I advised pt to focus on getting better and that the colpo procedure will be rescheduled. Pt stated understanding and had no further questions. Per Dr. Jolayne Panther as well, it is not recommended that the pt comes down for her appt.  Dr.  Jolayne Panther stated that she would also go speak with the pt.

## 2011-09-19 NOTE — Progress Notes (Signed)
Patient interviewed and fetal monitoring strip reviewed with RN.  No late's, but occasional mild variable.  Pt out of bed multiple times secondary to diuresis after Lasix 20, difficult;t to monitor.  Will attempt to place [facility] ext monitoring; not working, return to [facility]   Pt has continued to feel intermittent fetal activity.  resp rate 32, Temp 99 this am, now 98.2. Wt gain has been 18 lb since admit  Pt SaO2  98% -100% on Nasal cannula. CXR today : LLL atelectasis vs infiltrate, no evidence of interstitial edema or cardiomegaly Pt now on Flonase and will use Bipap tonite.  A:  Asthma with exacerbation 30+2 wks      LLL atalectasis versus developing infiltrate on ABX     Morbid obesity,      Acute wt gain 120.6-> 128.5 kg since 5/29 after steroids  P fetal monitor q shift      Bipap tonight on ANTENatal

## 2011-09-19 NOTE — Consult Note (Signed)
Chief Complaint  Patient presents with  . Asthma  . Respiratory Distress   Referring provider: Scheryl Darter.  History of Present Illness: Cheyenne Arias is a 36 y.o. female for evaluation of asthma.  She was admitted by Ob on 09/15/2011 with progressive shortness of breath and fetal distress.    She has history of mild, intermittent asthma.  She is currently [redacted] weeks pregnant.  She has hypertension.  She has a history of mild sleep apnea (PSG 03/27/11>>AHI 10.9), and is to be using CPAP 10 cm H2O.  She developed a sinus infection 2 weeks prior to admission.  She was treated with one week of amoxicillin.  This helped, but she continues to have sinus congestion.  One week prior to admission she awoke from sleep with funny breathing noises.  She went to urgent care several times for breathing problems.  She was told she had an asthma flare.  She was started on albuterol/ipratropium nebulizer therapy, high dose prednisone, and omnicef.  She continued to have trouble with her breathing, and chest tightness.  She was also found to have fetal distress.  She was noted to have hypoxia on exam.    Since admission she was started on advair, scheduled nebulizer therapy, high dose solumedrol, and broad spectrum antibiotics.  She feels her breathing has improved slightly.  She still gets winded with minimal activity, including talking.  She still has sinus congestion, and this has prevented her from using her CPAP.  She normally uses nasal pillows, but has not been able to tolerate this due to nasal congestion.  She has gained about 20 lbs since May and has increased leg swelling.  She feels nebulizer therapy helps, but she does not get complete relief.  Her blood pressure has been elevated intermittently during this admission.  Past Medical History  Diagnosis Date  . Hypertension   . Migraine   . Anxiety   . Abnormal Pap smear   . Anemia   . Fibroids   . Fibromyalgia     degenerative disc disease  .  Depression   . Sleep apnea   . Asthma     Past Surgical History  Procedure Date  . Wisdom tooth extraction   . Mandible surgery   . Carpal tunnel release   . Cesarean section     1994, 1996     No current facility-administered medications on file prior to encounter.   Current Outpatient Prescriptions on File Prior to Encounter  Medication Sig Dispense Refill  . albuterol (PROVENTIL HFA;VENTOLIN HFA) 108 (90 BASE) MCG/ACT inhaler Inhale 2 puffs into the lungs every 6 (six) hours as needed. As needed for asthma/shortness of breath      . cyclobenzaprine (FLEXERIL) 10 MG tablet Take 10 mg by mouth 3 (three) times daily as needed.      . diphenhydramine-acetaminophen (TYLENOL PM) 25-500 MG TABS Take 1 tablet by mouth at bedtime as needed. Takes for sleep      . labetalol (NORMODYNE) 100 MG tablet Take 1 tablet (100 mg total) by mouth 2 (two) times daily.  60 tablet  11  . Multiple Vitamins-Minerals (MULTI-VITAMIN GUMMIES) CHEW Chew 1 tablet by mouth daily.      [name] oxyCODONE-acetaminophen (PERCOCET) 5-325 MG per tablet Take 1 tablet by mouth every 8 (eight) hours as needed.      . predniSONE (DELTASONE) 20 MG tablet Take 2 tablets (40 mg total) by mouth daily.  10 tablet  0  . sodium chloride 0.9 %  SOLN 500 mL with iron dextran complex 50 MG/ML SOLN Inject into the vein every 3 (three) months.        Allergies  Allergen Reactions  . Latex Itching and Swelling    Blisters  . Morphine And Related Itching    Family History  Problem Relation Age of Onset  . Sickle cell trait Maternal Aunt   . Sickle cell anemia Other     History  Substance Use Topics  . Smoking status: Never Smoker   . Smokeless tobacco: Never Used  . Alcohol Use: No     Physical Exam: Filed Vitals:   09/19/11 1438 09/19/11 1443 09/19/11 1540 09/19/11 1600  BP:      Pulse: 122 124 124 124  Temp:   98.4 F (36.9 C)   TempSrc:   Oral   Resp:   30   Height:      Weight:      SpO2: 99% 99% 98% 94%   Wt  Readings from Last 3 Encounters:  09/19/11 283 lb 4.8 oz (128.504 kg)  09/12/11 266 lb (120.657 kg)  09/10/11 270 lb (122.471 kg)   Body mass index is 55.33 kg/(m^2).  General - Obese, sitting up in bed, mild increased WOB with speaking ENT - Ears normal. Throat and pharynx normal. Neck supple. No adenopathy or masses in the neck or supraclavicular regions. Clear nasal drainage, mild tenderness over frontal and maxillary sinuses. Cardiac - S1 and S2 normal, no murmurs, clicks, gallops or rubs. Regular rate and rhythm.  1+ leg edema. Chest - Diminished breath sounds, decreased respiratory excursion with inhalation, faint basilar rales and wheeze Back - no areas of tenderness on palpation of spine Abd - gravid uterus, non tender, + bowel sounds Ext - good pulses, motor strength and sensation. Neuro - Cranial nerves are normal. PERLA. EOM's intact. Skin - no discernible active dermatitis, erythema, urticaria or inflammatory process. Psych - normal mood, and behavior.   Dg Chest 2 View  09/19/2011  *RADIOLOGY REPORT*  Clinical Data: New onset lung crackles, severe shortness of breath, asthma, cough, chest pressure, pregnant, follow-up  CHEST - 2 VIEW  Comparison: 09/15/2011  Findings: Minimal prominence of cardiac silhouette pulmonary vasculature consistent with pregnancy. Mediastinal contours normal. Question developing infiltrate versus atelectasis left lower lobe. Remaining lungs clear. No pleural effusion or pneumothorax. Bones unremarkable.  IMPRESSION: Question developing left lower lobe infiltrate versus atelectasis.  Original Report Authenticated By: Lollie Marrow, M.D.    BMET    Component Value Date/Time   NA 138 08/27/2011 1434   K 3.6 08/27/2011 1434   CL 106 08/27/2011 1434   CO2 22 08/27/2011 1434   GLUCOSE 81 08/27/2011 1434   BUN 5* 08/27/2011 1434   CREATININE 0.40* 08/27/2011 1434   CREATININE 0.49* 07/12/2011 1346   CREATININE 0.49* 07/12/2011 1346   CALCIUM 8.9 08/27/2011 1434    GFRNONAA 74* 02/16/2011 1825   GFRAA 86* 02/16/2011 1825    CBC    Component Value Date/Time   WBC 14.5* 09/15/2011 1537   WBC 12.4* 08/27/2011 1434   RBC 4.66 09/15/2011 1537   RBC 4.47 08/27/2011 1434   HGB 9.2* 09/15/2011 1537   HGB 9.0* 08/27/2011 1434   HCT 31.1* 09/15/2011 1537   HCT 29.3* 08/27/2011 1434   PLT 425* 09/15/2011 1537   PLT 404* 08/27/2011 1434   MCV 66.7* 09/15/2011 1537   MCV 65.5* 08/27/2011 1434   MCH 19.7* 09/15/2011 1537   MCH 20.1* 08/27/2011 1434  MCHC 29.6* 09/15/2011 1537   MCHC 30.6* 08/27/2011 1434   RDW 16.9* 09/15/2011 1537   RDW 17.5* 08/27/2011 1434   LYMPHSABS 1.6 08/27/2011 1434   LYMPHSABS 1.8 02/16/2011 1825   MONOABS 0.7 08/27/2011 1434   MONOABS 0.6 02/16/2011 1825   EOSABS 0.2 08/27/2011 1434   EOSABS 0.3 02/16/2011 1825   BASOSABS 0.0 08/27/2011 1434   BASOSABS 0.0 02/16/2011 1825    Lab Results  Component Value Date   ALT <8 08/27/2011   AST 9 08/27/2011   ALKPHOS 64 08/27/2011   BILITOT 0.3 08/27/2011   ABG    Component Value Date/Time   PHART 7.440* 09/16/2011 0645   PCO2ART 29.6* 09/16/2011 0645   PO2ART 86.7 09/16/2011 0645   HCO3 19.8* 09/16/2011 0645   TCO2 20.7 09/16/2011 0645   ACIDBASEDEF 2.7* 09/16/2011 0645   O2SAT 92.0 09/16/2011 0645    Urinalysis    Component Value Date/Time   COLORURINE YELLOW 09/04/2011 0346   APPEARANCEUR CLEAR 09/04/2011 0346   LABSPEC 1.025 09/06/2011 1008   LABSPEC 1.010 01/22/2008 1448   PHURINE 6.5 09/06/2011 1008   GLUCOSEU NEGATIVE 09/06/2011 1008   HGBUR NEGATIVE 09/06/2011 1008   BILIRUBINUR NEGATIVE 09/06/2011 1008   KETONESUR NEGATIVE 09/06/2011 1008   PROTEINUR NEGATIVE 09/06/2011 1008   UROBILINOGEN 0.2 09/06/2011 1008   NITRITE NEGATIVE 09/06/2011 1008   LEUKOCYTESUR NEGATIVE 09/06/2011 1008    Assessment/Plan:  36 yo never smoking female [redacted] weeks pregnant with progressive dyspnea.  She has history of mild, intermittent asthma.  Her symptoms developed after sinus infection.  She has been on aggressive asthma  regimen, but has not had complete response.  She has significant increase in weight since May associated with leg swelling.  She has mild vascular congestion on chest xray, and has intermittently elevated blood pressure.  She has history of sleep apnea, but has not been able to use CPAP due to nasal congestion.  Acute asthma exacerbation Plan: -d/c advair -start pulmicort via nebulizer -change solumedrol to 40 mg IV q12h -continue schedule albuterol/ipratropium nebulizer therapy -continue rocephin/zithromax D1/x, D4/x Abx>>will d/c cefdinir while on rocephin and zithromax  Acute sinusitis Plan: -continue saline nasal spray -will add schedule flonase -try to limit use of afrin -continue schedule loratadine for now -continue abx  Atelectasis Plan: -mobilize as tolerated -incentive spirometry  Hx of HTN, peripheral edema, interstitial edema on CXR, and progressive weight gain Plan: -continue norvasc -give lasix 40 mg IV x one  -f/u BMET, BNP -try to limit chest imaging studies -may need Echo to assess LV and RV function  OSA Plan: -will change to full face mask until sinus congestion improves -continue CPAP 10 cm H2O qhs  Hypoxia secondary to above Plan: -continue supplemental oxygen to keep SpO2 > 95%  [name], MD [facility] Pulmonary/Critical Care 09/19/2011, 5:14 PM Pager:  [phone] After 3pm call: [phone]

## 2011-09-19 NOTE — Telephone Encounter (Signed)
Pt called and stated that she is in the hospital currently for being sick and she is scheduled for a procedure tomorrow 09/20/11 (scheduled for a colpo) and would like for it to done "up there" in her current hospital room. Pt is pregnant.

## 2011-09-20 ENCOUNTER — Inpatient Hospital Stay (HOSPITAL_COMMUNITY): Payer: Medicaid Other

## 2011-09-20 ENCOUNTER — Encounter (HOSPITAL_COMMUNITY): Payer: Self-pay | Admitting: Anesthesiology

## 2011-09-20 ENCOUNTER — Encounter: Payer: Medicaid Other | Admitting: Physician Assistant

## 2011-09-20 DIAGNOSIS — R0609 Other forms of dyspnea: Secondary | ICD-10-CM

## 2011-09-20 DIAGNOSIS — R0989 Other specified symptoms and signs involving the circulatory and respiratory systems: Secondary | ICD-10-CM

## 2011-09-20 DIAGNOSIS — E669 Obesity, unspecified: Secondary | ICD-10-CM

## 2011-09-20 LAB — CBC
MCH: 19.9 pg — ABNORMAL LOW (ref 26.0–34.0)
MCHC: 30 g/dL (ref 30.0–36.0)
Platelets: 486 10*3/uL — ABNORMAL HIGH (ref 150–400)
RBC: 4.67 MIL/uL (ref 3.87–5.11)

## 2011-09-20 LAB — BASIC METABOLIC PANEL
BUN: 13 mg/dL (ref 6–23)
Calcium: 9.4 mg/dL (ref 8.4–10.5)
GFR calc non Af Amer: >90 mL/min (ref >90)
Glucose, Bld: 152 mg/dL — ABNORMAL HIGH (ref 70–99)
Sodium: 136 mEq/L (ref 135–145)

## 2011-09-20 LAB — PRO B NATRIURETIC PEPTIDE: Pro B Natriuretic peptide (BNP): 128.9 pg/mL — ABNORMAL HIGH (ref 0–125)

## 2011-09-20 MED ORDER — ALBUTEROL SULFATE (5 MG/ML) 0.5% IN NEBU
2.5000 mg | INHALATION_SOLUTION | Freq: Four times a day (QID) | RESPIRATORY_TRACT | Status: DC
  Administered 2011-09-21: 2.5 mg via RESPIRATORY_TRACT
  Filled 2011-09-20 (×3): qty 0.5

## 2011-09-20 MED ORDER — IPRATROPIUM BROMIDE 0.02 % IN SOLN
0.5000 mg | RESPIRATORY_TRACT | Status: DC | PRN
  Administered 2011-09-20 – 2011-09-21 (×3): 0.5 mg via RESPIRATORY_TRACT
  Filled 2011-09-20: qty 2.5

## 2011-09-20 MED ORDER — IPRATROPIUM BROMIDE 0.02 % IN SOLN
0.5000 mg | Freq: Four times a day (QID) | RESPIRATORY_TRACT | Status: DC
  Administered 2011-09-21: 0.5 mg via RESPIRATORY_TRACT
  Filled 2011-09-20 (×3): qty 2.5

## 2011-09-20 MED ORDER — FLUTICASONE PROPIONATE 50 MCG/ACT NA SUSP
2.0000 | Freq: Two times a day (BID) | NASAL | Status: DC
  Administered 2011-09-20 – 2011-09-28 (×16): 2 via NASAL

## 2011-09-20 MED ORDER — PREDNISONE 50 MG PO TABS
60.0000 mg | ORAL_TABLET | Freq: Every day | ORAL | Status: DC
  Administered 2011-09-21 – 2011-09-22 (×2): 60 mg via ORAL
  Filled 2011-09-20 (×3): qty 1

## 2011-09-20 MED ORDER — CEFUROXIME AXETIL 500 MG PO TABS
500.0000 mg | ORAL_TABLET | Freq: Two times a day (BID) | ORAL | Status: AC
  Administered 2011-09-20 – 2011-09-23 (×6): 500 mg via ORAL
  Filled 2011-09-20 (×6): qty 1

## 2011-09-20 MED ORDER — ALBUTEROL SULFATE (5 MG/ML) 0.5% IN NEBU
2.5000 mg | INHALATION_SOLUTION | RESPIRATORY_TRACT | Status: DC | PRN
  Administered 2011-09-20 – 2011-09-21 (×3): 2.5 mg via RESPIRATORY_TRACT
  Filled 2011-09-20: qty 0.5

## 2011-09-20 MED ORDER — FUROSEMIDE 10 MG/ML IJ SOLN
40.0000 mg | Freq: Once | INTRAMUSCULAR | Status: AC
  Administered 2011-09-20: 40 mg via INTRAVENOUS
  Filled 2011-09-20: qty 4

## 2011-09-20 MED ORDER — POTASSIUM CHLORIDE CRYS ER 20 MEQ PO TBCR
40.0000 meq | EXTENDED_RELEASE_TABLET | Freq: Once | ORAL | Status: AC
  Administered 2011-09-20: 40 meq via ORAL
  Filled 2011-09-20: qty 2

## 2011-09-20 NOTE — Progress Notes (Signed)
Assisted pt with home CPAP machine. I put our circuit and FFM on her machine, checked the settings-10 cmH2O.  Pt currently wearing the CPAP of 10 with 2L o2 bled in. Pt spo2 100%.  Pt is resting and comfortable. RT will continue to monitor.

## 2011-09-20 NOTE — Progress Notes (Signed)
  Echocardiogram 2D Echocardiogram has been performed.  Cheyenne Arias A 09/20/2011, 3:28 PM

## 2011-09-20 NOTE — Progress Notes (Signed)
Cheyenne Arias was seen for ultrasound appointment today.  Please see [facility]-OBGYN report for details.

## 2011-09-20 NOTE — Progress Notes (Signed)
Patient ID: Cheyenne Arias, female   DOB: Apr 30, 1975, 36 y.o.   MRN: [id number]  FACULTY PRACTICE ANTEPARTUM(COMPREHENSIVE) NOTE  Cheyenne Arias is a 36 y.o. G3P2002 at [redacted]w[redacted]d  who is admitted for asthma exacerbation.   Length of Stay:  5  Days  Subjective: Pt still feels some SOB with activity.  No SOB at rest.   Patient reports the fetal movement as present. Patient reports uterine contraction  activity as none. Patient reports  vaginal bleeding as none. Patient describes fluid per vagina as None.  Vitals:  Blood pressure 114/45, pulse 118, temperature 98.5 F (36.9 C), temperature source Oral, resp. rate 24, height 5' (1.524 m), weight 287 lb 8 oz (130.409 kg), last menstrual period 02/19/2011, SpO2 98.00%. Physical Examination:  General appearance - alert, well appearing, and in no distress Chest - rales noted in bases, scant wheezes bilaterally Heart - normal rate, regular rhythm, normal S1, S2, no murmurs, rubs, clicks or gallops, normal rate and regular rhythm Abdomen - gravid, NT Extremities - pedal edema 2 +  Fetal Monitoring:  Baseline: 140 bpm, Variability: Good {> 6 bpm), Accelerations: up to 10 x 10 and Decelerations: a few mild variables during NST  Labs:  Recent Results (from the past 24 hour(s))  BASIC METABOLIC PANEL   Collection Time   09/20/11  5:20 AM      Component Value Range   Sodium 136  135 - 145 (mEq/L)   Potassium 4.0  3.5 - 5.1 (mEq/L)   Chloride 101  96 - 112 (mEq/L)   CO2 25  19 - 32 (mEq/L)   Glucose, Bld 152 (*) 70 - 99 (mg/dL)   BUN 13  6 - 23 (mg/dL)   Creatinine, Ser 4.09 (*) 0.50 - 1.10 (mg/dL)   Calcium 9.4  8.4 - 81.1 (mg/dL)   GFR calc non Af Amer >90  >90 (mL/min)   GFR calc Af Amer >90  >90 (mL/min)  CBC   Collection Time   09/20/11  5:20 AM      Component Value Range   WBC 24.2 (*) 4.0 - 10.5 (K/uL)   RBC 4.67  3.87 - 5.11 (MIL/uL)   Hemoglobin 9.3 (*) 12.0 - 15.0 (g/dL)   HCT 91.4 (*) 78.2 - 46.0 (%)   MCV 66.4 (*) 78.0 - 100.0  (fL)   MCH 19.9 (*) 26.0 - 34.0 (pg)   MCHC 30.0  30.0 - 36.0 (g/dL)   RDW 95.6 (*) 21.3 - 15.5 (%)   Platelets 486 (*) 150 - 400 (K/uL)  PRO B NATRIURETIC PEPTIDE   Collection Time   09/20/11  5:20 AM      Component Value Range   Pro B Natriuretic peptide (BNP) 128.9 (*) 0 - 125 (pg/mL)  PROTEIN / CREATININE RATIO, URINE   Collection Time   09/20/11 10:50 AM      Component Value Range   Creatinine, Urine 82.31     Total Protein, Urine 13.8     PROTEIN CREATININE RATIO 0.17 (*) 0.00 - 0.15     Imaging Studies:    BPP 6/8 with intermittent breathing but not sustained.  Medications:  Scheduled    . albuterol  2.5 mg Nebulization Q4H  . amLODipine  10 mg Oral Daily  . azithromycin  500 mg Oral Daily  . budesonide  0.5 mg Nebulization BID  . cefTRIAXone (ROCEPHIN)  IV  1 g Intravenous Q12H  . docusate sodium  100 mg Oral Daily  . fluticasone  2  spray Each Nare BID  . furosemide  40 mg Intravenous Once  . ipratropium  0.5 mg Nebulization Q4H  . loratadine  10 mg Oral Daily  . methylPREDNISolone sodium succinate  40 mg Intravenous Q12H  . prenatal vitamin w/FE, FA  1 tablet Oral Daily  . sodium chloride  3 mL Intravenous Q12H  . DISCONTD: cefdinir  600 mg Oral Daily  . DISCONTD: fluticasone  2 spray Each Nare Daily  . DISCONTD: Fluticasone-Salmeterol  1 puff Inhalation BID  . DISCONTD: methylPREDNISolone sodium succinate  125 mg Intravenous Q8H     ASSESSMENT: Patient Active Problem List  Diagnoses  . Bleeding in early pregnancy  . Fibromyalgia  . Low back pain  . AMA (advanced maternal age) multigravida 35+  . Obesity  . Viral URI  . Pelvic relaxation  . History of urinary retention  . Hx of dysmenorrhea  . Fibroids  . Anemia  . Hypertension complicating pregnancy, childbirth and puerperium, antepartum  . Benign essential HTN  . Supervision of high-risk pregnancy  . ASCUS with positive high risk HPV  . History of cesarean delivery, currently pregnant  . Asthma  complicating pregnancy, antepartum  . Acute asthma exacerbation  . Atelectasis  . Acute sinusitis  . Hypoxia  . Peripheral edema  . Weight gain  . OSA (obstructive sleep apnea)  . Hypertension    PLAN: 1-Follow pulmonary recommendations.   2-Echo ordered 3-Monitor fetus q 4 hours until next BPP tomorrow a.m. With MFM   Cheyenne Arias H. 09/20/2011,1:58 PM

## 2011-09-20 NOTE — Progress Notes (Signed)
SUBJ: Still with dyspnea on minimal exertion. Minimal improvement with bronchodilators. No new complaints  OBJ: Dyspneic with speech Sounds nasally congested Short thick neck - JVP cannot be assessed Breath sounds coarse, no wheezes, bibasilar rales Distant HS, no murmurs heard Obese soft NT NABS Trace symmetric pretibial edema   No new CXR  Today's labs reviewed  ECHO results pending    IMP/PLAN Severe dyspnea - multifactorial  H/O asthma  Exam consistent with pulmonary edema  Severe obesity/pregnant state are contributors  - Decrease steroids further, change to PO - Cont bronchodilators - repeat Lasix today (with KCl to prevent hypokalemia) - follow up Echo results - Narrow abx to PO ceftin  (Day 5/7 abx) - She should receive DVT prophylaxis until fully ambulatory - will leave to OB/GYN to consider SCDs vs LMWH    Billy Fischer, MD;  PCCM service; Mobile [phone]

## 2011-09-20 NOTE — Anesthesia Preprocedure Evaluation (Deleted)
Anesthesia Evaluation    Airway Mallampati: III TM Distance: >3 FB    Comment: Large neck, and Huge tonsils, opens well Sleep Apnea  Dental   Pulmonary asthma , sleep apnea , pneumonia ,   Diminished expiratory flow and Pt is Tachypnic.  09/20/11: I did not hear rales or Wheezes. (+) productive cough   + decreased breath sounds- wheezing      Cardiovascular hypertension, Pt. on medications     Neuro/Psych    GI/Hepatic   Endo/Other  Morbid obesity  Renal/GU      Musculoskeletal   Abdominal   Peds  Hematology  (+) anemia ,   Anesthesia Other Findings   Reproductive/Obstetrics                          Anesthesia Physical Anesthesia Plan Anesthesia Quick Evaluation

## 2011-09-21 ENCOUNTER — Inpatient Hospital Stay (HOSPITAL_COMMUNITY): Payer: Medicaid Other

## 2011-09-21 DIAGNOSIS — R609 Edema, unspecified: Secondary | ICD-10-CM

## 2011-09-21 DIAGNOSIS — R0989 Other specified symptoms and signs involving the circulatory and respiratory systems: Secondary | ICD-10-CM | POA: Diagnosis present

## 2011-09-21 DIAGNOSIS — R0602 Shortness of breath: Secondary | ICD-10-CM

## 2011-09-21 DIAGNOSIS — R0609 Other forms of dyspnea: Secondary | ICD-10-CM | POA: Diagnosis present

## 2011-09-21 MED ORDER — LEVALBUTEROL HCL 0.63 MG/3ML IN NEBU
0.6300 mg | INHALATION_SOLUTION | RESPIRATORY_TRACT | Status: DC | PRN
  Administered 2011-09-22 – 2011-09-24 (×4): 0.63 mg via RESPIRATORY_TRACT
  Filled 2011-09-21: qty 3

## 2011-09-21 MED ORDER — METOPROLOL TARTRATE 25 MG PO TABS
25.0000 mg | ORAL_TABLET | Freq: Two times a day (BID) | ORAL | Status: DC
  Administered 2011-09-21 – 2011-09-28 (×15): 25 mg via ORAL
  Filled 2011-09-21 (×19): qty 1

## 2011-09-21 MED ORDER — POTASSIUM CHLORIDE CRYS ER 20 MEQ PO TBCR
40.0000 meq | EXTENDED_RELEASE_TABLET | Freq: Once | ORAL | Status: AC
  Administered 2011-09-21: 40 meq via ORAL
  Filled 2011-09-21: qty 2

## 2011-09-21 MED ORDER — FUROSEMIDE 10 MG/ML IJ SOLN
40.0000 mg | Freq: Once | INTRAMUSCULAR | Status: AC
  Administered 2011-09-21: 40 mg via INTRAVENOUS
  Filled 2011-09-21: qty 4

## 2011-09-21 MED ORDER — LEVALBUTEROL HCL 0.63 MG/3ML IN NEBU
0.6300 mg | INHALATION_SOLUTION | Freq: Four times a day (QID) | RESPIRATORY_TRACT | Status: DC
  Administered 2011-09-21 – 2011-09-28 (×26): 0.63 mg via RESPIRATORY_TRACT
  Filled 2011-09-21 (×32): qty 3

## 2011-09-21 NOTE — Progress Notes (Signed)
Assisted pt with home CPAP, settings are at [facility] with 2L o2 bled in.  Pt is resting and comfortable, RT will continue to monitor.

## 2011-09-21 NOTE — Progress Notes (Addendum)
SUBJ: Still with dyspnea, no better. She is adamant that she needs bronchodilators every 4 hrs despite the fact that they have offered her minimal benefit. Problems with tachycardia  OBJ:  VS reviewed. Wt 280# (down 7 since yest)  Less dyspneic appearing Sounds nasally congested Short thick neck - JVP cannot be assessed Breath sounds slightly coarse, no wheezes, bibasilar rales persist Distant HS, no murmurs heard Obese soft NT NABS Trace symmetric pretibial edema   No new CXR  Today's labs reviewed  ECHO:nl LVEF. Mild LVH. No elevation of R sided pressures. No TR. RA not dilated    IMP/PLAN 1) Severe dyspnea - multifactorial  H/O asthma  Exam consistent with pulmonary edema  Severe obesity It seems doubtful that her persistent dyspnea is attributable to refractory asthma. Exam doesn't support this and lack of response to usual asthma therapy argues against it. She does not appear to have benefited symptomatically from diuresis and her Echocardiogram is not supportive of the diagnosis of CHF though she could have a component of diastolic dysfunction that is exacerbated by tachycardia. We have not pursued a work up for VTE and she is certainly high risk for this given obesity and pregnancy. i also suspect there is a psychogenic component that is amplifying her symptoms - Cont Prednisone at current dose - Change albuterol to levalbuterol to reduce cardiotoxicity (tachycardia) - repeat Lasix today (with KCl to prevent hypokalemia - cont ceftin  (Day 6/7 abx) - begin metoprolol 6/7 for tachycardia - LE venous Dopplers to r/o DVT. If positive, would presume she has had PE and treat accordingly. If negative, would strongly consider CTA chest to look for PE and better eval lung fields (noting persistent crackles in both bases despite little evidence of pulmonary edema by other diagnostic studies)   Dr Craige Cotta will see this WE. Pager [phone]    Billy Fischer, MD;  PCCM service

## 2011-09-21 NOTE — Progress Notes (Signed)
Patient seen for follow up BPP.  See report in [facility].  Alpha Gula, MD  Single IUP at 30 4/7 weeks Active fetus with a BPP of 8/8 Normal amniotic fluid volume  Recommend follow up as clinically indicated

## 2011-09-21 NOTE — Progress Notes (Deleted)
Respiratory Care Note:  Micah Flesher to do Ms. Felty's treatment since she had called 30 minutes prior to scheduled time.  Prior to starting treatment, tried to get Ms. Lowden to do Peak Flow.  She did it once @ 200L/M, when I tried to get her to try again, she refused and got ugly and said she needed her treatment.  So I started her treatment @ once.  Breath sounds pre and post treatment essentially the same, equal with rales bilaterally and decreased in the bases.  Her Peak Flow post treatment was 230 L/M.  She was also instructed on Incentive Spirometry per order.  The patient and her mother were both very upset about her care and felt not enough was being done for her.  They wanted to know when her next treatment was and I told them in 6 hours but that she had a prn treatment order for Q3hrs.  Which is how often this patient feels she needs treatments.  Patient did not have wheezes at any time before or after treatment.  Any questions today, please contact me on [phone].  Thank you.  Hector Brunswick RRT-NPS,RCP

## 2011-09-21 NOTE — Progress Notes (Signed)
Patient ID: Cheyenne Arias, female   DOB: [birth year], 36 y.o.   MRN: [id number]  FACULTY PRACTICE ANTEPARTUM NOTE  Cheyenne Arias is a 36 y.o. G3P2002 at [redacted]w[redacted]d  who is admitted for SOB and pulmonary disease.   Fetal presentation is cephalic. Length of Stay:  6  Days  Subjective: Remains dyspneic at rest that worsens with movement.     Patient reports good fetal movement.  She reports no uterine contractions, no bleeding and no loss of fluid per vagina.  Vitals:  Blood pressure 130/52, pulse 130, temperature 98.7 F (37.1 C), temperature source Axillary, resp. rate 24, height 5' (1.524 m), weight 130.409 kg (287 lb 8 oz), last menstrual period 02/19/2011, SpO2 98.00%. Physical Examination:  General appearance - alert, dyspneic at rest.  Mild distress Abdomen - soft, nontender, nondistended, no masses or organomegaly Fundal Height:  obese - not able to determine Extremities: extremities normal, atraumatic, no cyanosis or edema   Fetal Monitoring:  Baseline: 150s bpm and Variability: Good {> 6 bpm)  Labs:  Recent Results (from the past 24 hour(s))  PROTEIN / CREATININE RATIO, URINE   Collection Time   09/20/11 10:50 AM      Component Value Range   Creatinine, Urine 82.31     Total Protein, Urine 13.8     PROTEIN CREATININE RATIO 0.17 (*) 0.00 - 0.15   CULTURE, RESPIRATORY   Collection Time   09/20/11  5:45 PM      Component Value Range   Specimen Description SPU     Special Requests NONE     Gram Stain       Value: FEW WBC PRESENT,BOTH PMN AND MONONUCLEAR     FEW SQUAMOUS EPITHELIAL CELLS PRESENT     RARE GRAM NEGATIVE RODS   Culture PENDING     Report Status PENDING      Imaging Studies:    BPP 8/8 in 9 minutes  Medications:  Scheduled    . albuterol  2.5 mg Nebulization Q6H  . amLODipine  10 mg Oral Daily  . budesonide  0.5 mg Nebulization BID  . cefUROXime  500 mg Oral BID WC  . docusate sodium  100 mg Oral Daily  . fluticasone  2 spray Each Nare BID  .  furosemide  40 mg Intravenous Once  . ipratropium  0.5 mg Nebulization Q6H  . loratadine  10 mg Oral Daily  . potassium chloride  40 mEq Oral Once  . predniSONE  60 mg Oral Q breakfast  . prenatal vitamin w/FE, FA  1 tablet Oral Daily  . sodium chloride  3 mL Intravenous Q12H  . DISCONTD: albuterol  2.5 mg Nebulization Q4H  . DISCONTD: azithromycin  500 mg Oral Daily  . DISCONTD: cefTRIAXone (ROCEPHIN)  IV  1 g Intravenous Q12H  . DISCONTD: fluticasone  2 spray Each Nare Daily  . DISCONTD: ipratropium  0.5 mg Nebulization Q4H  . DISCONTD: methylPREDNISolone sodium succinate  40 mg Intravenous Q12H   I have reviewed the patient's current medications.  ASSESSMENT: Patient Active Problem List  Diagnoses  . Bleeding in early pregnancy  . Fibromyalgia  . Low back pain  . AMA (advanced maternal age) multigravida 35+  . Obesity  . Viral URI  . Pelvic relaxation  . History of urinary retention  . Hx of dysmenorrhea  . Fibroids  . Anemia  . Hypertension complicating pregnancy, childbirth and puerperium, antepartum  . Benign essential HTN  . Supervision of high-risk pregnancy  .  ASCUS with positive high risk HPV  . History of cesarean delivery, currently pregnant  . Asthma complicating pregnancy, antepartum  . Acute asthma exacerbation  . Atelectasis  . Acute sinusitis  . Hypoxia  . Peripheral edema  . Weight gain  . OSA (obstructive sleep apnea)  . Hypertension    PLAN: Appreciate pulmonology's assistance with this patient.  Continue respiratory treatments per their recommendations. Reassuring BPP and FHT.  Continue monitoring. Continue routine antenatal care.   Cheyenne Arias JEHIEL 09/21/2011,10:21 AM

## 2011-09-21 NOTE — Progress Notes (Signed)
Patient continues to be very upset and adamant that both nebs stay on the same schedule. She is demanding to talk to the MD.  Dr. Sung Amabile again called and atrovent order also changed to every 3 hours prn. He will see patient in the am.

## 2011-09-21 NOTE — Progress Notes (Signed)
Respiratory Care Note:  Went to do Cheyenne Arias's treatment since she had called 30 minutes prior to scheduled time.  Prior to starting treatment, tried to get Cheyenne Arias to do Peak Flow.  She did it once @ 200L/M, when I tried to get her to try again, she refused and got ugly and said she needed her treatment.  So I started her treatment @ once.  Breath sounds pre and post treatment essentially the same, equal with rales bilaterally and decreased in the bases.  Her Peak Flow post treatment was 230 L/M.  She was also instructed on Incentive Spirometry per order.  The patient and her mother were both very upset about her care and felt not enough was being done for her.  They wanted to know when her next treatment was and I told them in 6 hours but that she had a prn treatment order for Q3hrs.  Which is how often this patient feels she needs treatments.  Patient did not have wheezes at any time before or after treatment.  Any questions today, please contact me on [phone].  Thank you.  Trevino Wyatt RRT-NPS,RCP 

## 2011-09-21 NOTE — Progress Notes (Signed)
Attempted to start NST; however, patient coughing and then needed to urinate and then sitting at side of bed.

## 2011-09-21 NOTE — Progress Notes (Signed)
Patient very concerned about change to frequency of albuterol and atrovent to every 6 hours.  She is quite upset and does not want this changed. Spoke to Dr. Park Breed and order changed to albuterol every 3 hours prn.

## 2011-09-21 NOTE — Progress Notes (Signed)
Continuing to attempt to maintain FHR tracing. Difficult due to body habitus and continued abdominal breathing. Continue to obtain audible FHR in 150-160s.

## 2011-09-21 NOTE — Progress Notes (Signed)
Bilateral lower extremity venous duplex completed.  Preliminary report is negative for DVT, SVT, or a Baker's cyst. 

## 2011-09-21 NOTE — Progress Notes (Signed)
Monitors applied.  FHR tones audible in 150-160s. Difficult to trace because of abdominal breathing with CPAP. Continuing to monitor. Called away at 0300 for pain medication need.

## 2011-09-22 ENCOUNTER — Inpatient Hospital Stay (HOSPITAL_COMMUNITY): Payer: Medicaid Other

## 2011-09-22 DIAGNOSIS — G4733 Obstructive sleep apnea (adult) (pediatric): Secondary | ICD-10-CM

## 2011-09-22 DIAGNOSIS — O139 Gestational [pregnancy-induced] hypertension without significant proteinuria, unspecified trimester: Secondary | ICD-10-CM

## 2011-09-22 DIAGNOSIS — O0990 Supervision of high risk pregnancy, unspecified, unspecified trimester: Secondary | ICD-10-CM

## 2011-09-22 LAB — CULTURE, RESPIRATORY W GRAM STAIN

## 2011-09-22 MED ORDER — IOHEXOL 300 MG/ML  SOLN
100.0000 mL | Freq: Once | INTRAMUSCULAR | Status: AC | PRN
  Administered 2011-09-22: 100 mL via INTRAVENOUS

## 2011-09-22 MED ORDER — PREDNISONE 20 MG PO TABS
30.0000 mg | ORAL_TABLET | Freq: Every day | ORAL | Status: DC
  Administered 2011-09-23 – 2011-09-24 (×2): 30 mg via ORAL
  Filled 2011-09-22 (×2): qty 1

## 2011-09-22 NOTE — Progress Notes (Signed)
SUBJ: Still with dyspnea, no better. She is adamant that she needs bronchodilators every 4 hrs despite the fact that they have offered her minimal benefit. Problems with tachycardia  OBJ:  BP 115/42  Pulse 115  Temp(Src) 98.1 F (36.7 C) (Oral)  Resp 22  Ht 5' (1.524 m)  Wt 276 lb 6 oz (125.363 kg)  BMI 53.98 kg/m2  SpO2 96%  LMP 02/19/2011   Intake/Output Summary (Last 24 hours) at 09/22/11 1303 Last data filed at 09/22/11 1047  Gross per 24 hour  Intake   2760 ml  Output   5400 ml  Net  -2640 ml   Wt Readings from Last 3 Encounters:  09/22/11 276 lb 6 oz (125.363 kg)  09/12/11 266 lb (120.657 kg)  09/10/11 270 lb (122.471 kg)   General - increased WOB HEENT - no sinus tenderness Cardiac - s1s2 regular, tachycardic Chest - basilar rales, no wheeze Abd - gravid uterus, non tender Ext - ankle edema Neuro - A&O x 3  CBC    Component Value Date/Time   WBC 24.2* 09/20/2011 0520   WBC 12.4* 08/27/2011 1434   RBC 4.67 09/20/2011 0520   RBC 4.47 08/27/2011 1434   HGB 9.3* 09/20/2011 0520   HGB 9.0* 08/27/2011 1434   HCT 31.0* 09/20/2011 0520   HCT 29.3* 08/27/2011 1434   PLT 486* 09/20/2011 0520   PLT 404* 08/27/2011 1434   MCV 66.4* 09/20/2011 0520   MCV 65.5* 08/27/2011 1434   MCH 19.9* 09/20/2011 0520   MCH 20.1* 08/27/2011 1434   MCHC 30.0 09/20/2011 0520   MCHC 30.6* 08/27/2011 1434   RDW 17.5* 09/20/2011 0520   RDW 17.5* 08/27/2011 1434   LYMPHSABS 1.6 08/27/2011 1434   LYMPHSABS 1.8 02/16/2011 1825   MONOABS 0.7 08/27/2011 1434   MONOABS 0.6 02/16/2011 1825   EOSABS 0.2 08/27/2011 1434   EOSABS 0.3 02/16/2011 1825   BASOSABS 0.0 08/27/2011 1434   BASOSABS 0.0 02/16/2011 1825    BMET    Component Value Date/Time   NA 136 09/20/2011 0520   K 4.0 09/20/2011 0520   CL 101 09/20/2011 0520   CO2 25 09/20/2011 0520   GLUCOSE 152* 09/20/2011 0520   BUN 13 09/20/2011 0520   CREATININE 0.43* 09/20/2011 0520   CREATININE 0.49* 07/12/2011 1346   CREATININE 0.49* 07/12/2011 1346   CALCIUM 9.4  09/20/2011 0520   GFRNONAA >90 09/20/2011 0520   GFRAA >90 09/20/2011 0520    Lab Results  Component Value Date   ALT <8 08/27/2011   AST 9 08/27/2011   ALKPHOS 64 08/27/2011   BILITOT 0.3 08/27/2011   BNP    Component Value Date/Time   PROBNP 128.9* 09/20/2011 0520   US Fetal Bpp W/o Non Stress  09/21/2011  OBSTETRICAL ULTRASOUND: This exam was performed within a [facility]. The OB US report was generated in the [facility] system, and faxed to the ordering physician.   This report is also available in [employer] and in the [employer]. See [facility] Obstetric US report.   Echo 6/06>>mod LVH, EF 65 to 70%, small/mod pericardial effusion Doppler legs 6/07>>no DVT   IMP/PLAN 1) Severe dyspnea - multifactorial  H/O asthma  Exam consistent with pulmonary edema>>not much improvement with diuresis  Severe obesity It seems doubtful that her persistent dyspnea is attributable to refractory asthma. Exam doesn't support this and lack of response to usual asthma therapy argues against it. She does not appear to have benefited symptomatically from  diuresis and her Echocardiogram is not supportive of the diagnosis of CHF though she could have a component of diastolic dysfunction that is exacerbated by tachycardia. - Decrease prednisone to 30 mg daily - Continue levalbuterol to reduce cardiotoxicity (tachycardia) - hold further diuresis for now - Continue ceftin  (Day 7/7 abx) - Continue metoprolol 6/7 - Proceed with CT angiogram of chest to exclude PE  2) OSA -Continue CPAP qhs  Plan d/w pt, and Dr. Orvis Brill, MD [facility] Pulmonary/Critical Care 09/22/2011, 1:09 PM Pager:  [phone] After 3pm call: [phone]

## 2011-09-22 NOTE — Progress Notes (Signed)
FACULTY PRACTICE ANTEPARTUM NOTE  Cheyenne Arias is a 36 y.o. G3P2002 at [redacted]w[redacted]d  who is admitted for SOB and pulmonary disease.   Fetal presentation is cephalic. Length of Stay:  7  Days  Subjective: Remains dyspneic at rest that worsens with movement.  Followed by Pulmonology.  Patient reports good fetal movement.  She reports no uterine contractions, no bleeding and no loss of fluid per vagina.  Vitals:  Blood pressure 141/42, pulse 118, temperature 98.4 F (36.9 C), temperature source Oral, resp. rate 28, height 5' (1.524 m), weight 127.37 kg (280 lb 12.8 oz), last menstrual period 02/19/2011, SpO2 96.00%. Physical Examination: General appearance - alert, dyspneic at rest.  Mild distress Lungs - Decreased air entry at bases, expiratory wheezes Abdomen - soft, nontender, nondistended, no masses or organomegaly Fundal Height:  obese - not able to determine Extremities: extremities normal, atraumatic, no cyanosis or edema   Fetal Monitoring:  Baseline: 150s bpm and Variability: Good {> 6 bpm), no accels, no decels  Labs:  No results found for this or any previous visit (from the past 24 hour(s)).  Imaging Studies:    BPP 8/8 on 09/21/11.  BLE dopplers negative for DVT  Medications:  Scheduled    . amLODipine  10 mg Oral Daily  . budesonide  0.5 mg Nebulization BID  . cefUROXime  500 mg Oral BID WC  . docusate sodium  100 mg Oral Daily  . fluticasone  2 spray Each Nare BID  . furosemide  40 mg Intravenous Once  . levalbuterol  0.63 mg Nebulization Q6H  . loratadine  10 mg Oral Daily  . metoprolol tartrate  25 mg Oral BID  . potassium chloride  40 mEq Oral Once  . predniSONE  60 mg Oral Q breakfast  . prenatal vitamin w/FE, FA  1 tablet Oral Daily  . sodium chloride  3 mL Intravenous Q12H  . DISCONTD: albuterol  2.5 mg Nebulization Q6H  . DISCONTD: ipratropium  0.5 mg Nebulization Q6H   I have reviewed the patient's current medications.  ASSESSMENT: Patient Active  Problem List  Diagnoses  . Bleeding in early pregnancy  . Fibromyalgia  . Low back pain  . AMA (advanced maternal age) multigravida 35+  . Obesity  . Viral URI  . Pelvic relaxation  . History of urinary retention  . Hx of dysmenorrhea  . Fibroids  . Anemia  . Hypertension complicating pregnancy, childbirth and puerperium, antepartum  . Benign essential HTN  . Supervision of high-risk pregnancy  . ASCUS with positive high risk HPV  . History of cesarean delivery, currently pregnant  . Asthma complicating pregnancy, antepartum  . Acute asthma exacerbation  . Atelectasis  . Acute sinusitis  . Hypoxia  . Peripheral edema  . Weight gain  . OSA (obstructive sleep apnea)  . Hypertension  . Other dyspnea and respiratory abnormality    PLAN: Appreciate pulmonology's assistance with this patient.  Continue respiratory treatments per their recommendations. Reassuring BPP of 8/8 yesterday and FHT.  Continue monitoring q 8 hours. Continue routine antenatal care.   Tian Mcmurtrey A 09/22/2011,9:10 AM

## 2011-09-23 LAB — CBC
HCT: 32.5 % — ABNORMAL LOW (ref 36.0–46.0)
Hemoglobin: 9.6 g/dL — ABNORMAL LOW (ref 12.0–15.0)
MCHC: 29.5 g/dL — ABNORMAL LOW (ref 30.0–36.0)
RBC: 4.89 MIL/uL (ref 3.87–5.11)

## 2011-09-23 LAB — CULTURE, RESPIRATORY W GRAM STAIN: Culture: NORMAL

## 2011-09-23 LAB — BASIC METABOLIC PANEL
BUN: 8 mg/dL (ref 6–23)
Chloride: 99 mEq/L (ref 96–112)
GFR calc Af Amer: >90 mL/min (ref >90)
Potassium: 3.7 mEq/L (ref 3.5–5.1)
Sodium: 136 mEq/L (ref 135–145)

## 2011-09-23 NOTE — Progress Notes (Signed)
Rn at the bedside to assess pt. Patient c/o heaviness in her chest like someone is standing on it. She is requesting her O2 be turned back up to 2L/min. RN explained to pt that this is not recommended as her O2 sats were within normal limits and did not warrant increasing her O2 levels. Pt adament that she wants it turned back up. RN to call MD and notify.

## 2011-09-23 NOTE — Progress Notes (Signed)
Patient ID: Cheyenne Arias, female   DOB: 1975-06-20, 36 y.o.   MRN: [id number] FACULTY PRACTICE ANTEPARTUM(COMPREHENSIVE) NOTE  Cheyenne Arias is a 36 y.o. G3P2002 at [redacted]w[redacted]d by early ultrasound who is admitted for asthma exaserbation.   Fetal presentation is transverse. Length of Stay:  8  Days  Subjective: Patient upset about missing daughter's graduation but otherwise basically stable Patient reports the fetal movement as active. Patient reports uterine contraction  activity as none. Patient reports  vaginal bleeding as none. Patient describes fluid per vagina as None.  Vitals:  Blood pressure 110/57, pulse 119, temperature 98.1 F (36.7 C), temperature source Oral, resp. rate 24, height 5' (1.524 m), weight 275 lb 12.8 oz (125.102 kg), last menstrual period 02/19/2011, SpO2 96.00%. Physical Examination:  Fetal Monitoring:  Baseline: 140 bpm, Variability: Good {> 6 bpm), Accelerations: Non-reactive but appropriate for gestational age and Decelerations: Absent  Labs:  Recent Results (from the past 24 hour(s))  BASIC METABOLIC PANEL   Collection Time   09/23/11  5:07 AM      Component Value Range   Sodium 136  135 - 145 (mEq/L)   Potassium 3.7  3.5 - 5.1 (mEq/L)   Chloride 99  96 - 112 (mEq/L)   CO2 26  19 - 32 (mEq/L)   Glucose, Bld 96  70 - 99 (mg/dL)   BUN 8  6 - 23 (mg/dL)   Creatinine, Ser 1.47  0.50 - 1.10 (mg/dL)   Calcium 9.2  8.4 - 82.9 (mg/dL)   GFR calc non Af Amer >90  >90 (mL/min)   GFR calc Af Amer >90  >90 (mL/min)  CBC   Collection Time   09/23/11  5:07 AM      Component Value Range   WBC 21.1 (*) 4.0 - 10.5 (K/uL)   RBC 4.89  3.87 - 5.11 (MIL/uL)   Hemoglobin 9.6 (*) 12.0 - 15.0 (g/dL)   HCT 56.2 (*) 13.0 - 46.0 (%)   MCV 66.5 (*) 78.0 - 100.0 (fL)   MCH 19.6 (*) 26.0 - 34.0 (pg)   MCHC 29.5 (*) 30.0 - 36.0 (g/dL)   RDW 86.5 (*) 78.4 - 15.5 (%)   Platelets 438 (*) 150 - 400 (K/uL)    Imaging Studies:    Ct Angio Chest W/cm &/or Wo Cm  09/22/2011   *RADIOLOGY REPORT*  Clinical Data: Shortness of breath.  Asthma.  Chest pressure.  The patient is [redacted] weeks pregnant and was shielded.  CT ANGIOGRAPHY CHEST  Technique:  Multidetector CT imaging of the chest using the standard protocol during bolus administration of intravenous contrast. Multiplanar reconstructed images including MIPs were obtained and reviewed to evaluate the vascular anatomy.  Contrast: OMNIPAQUE IOHEXOL 300 MG/ML  SOLN  Comparison: Chest radiograph 09/19/2011  Findings:  There is slight limitation in evaluation of the subsegmental pulmonary arteries due to the IV location within the patient's hand, and patient body habitus. The visualized portion of the thyroid gland and thoracic inlet are normal.  The thoracic aorta is normal in caliber.  No evidence of aortic dissection. Heart size is upper normal.  No focal filling defect is identified within the pulmonary arteries to suggest pulmonary embolism.  Negative for pleural or pericardial effusion.  The distal thoracic esophagus is mildly distended with air.  Negative for lymphadenopathy.  There is a somewhat linearly oriented patchy opacity in the left lung base.  This could reflect atelectasis and/or airspace disease. Suggest correlation with any clinical signs of pneumonia.  The trachea mainstem bronchi are patent.  There is focal atelectasis in the right lower lobe.  Negative for pulmonary mass.  No acute bony abnormality.  No acute findings identified in the upper abdomen.  IMPRESSION: 1.  No evidence of pulmonary embolism.  Evaluation of the subsegmental pulmonary arteries is slightly limited by patient body habitus, and position of the patient's IV within the hand. 2.  Patchy opacity in the left lower lobe could reflect atelectasis and/or airspace disease.  Question if the patient has any clinical signs or symptoms of pneumonia. 3.  Gaseous dilatation of the distal thoracic esophagus with air. Although this is nonspecific, it can be seen  in patients with gastroesophageal reflux. 4.  Borderline cardiomegaly.  Original Report Authenticated By: Britta Mccreedy, M.D.    Medications:  Scheduled    . amLODipine  10 mg Oral Daily  . budesonide  0.5 mg Nebulization BID  . cefUROXime  500 mg Oral BID WC  . docusate sodium  100 mg Oral Daily  . fluticasone  2 spray Each Nare BID  . levalbuterol  0.63 mg Nebulization Q6H  . loratadine  10 mg Oral Daily  . metoprolol tartrate  25 mg Oral BID  . predniSONE  30 mg Oral Q breakfast  . prenatal vitamin w/FE, FA  1 tablet Oral Daily  . sodium chloride  3 mL Intravenous Q12H  . DISCONTD: predniSONE  60 mg Oral Q breakfast   I have reviewed the patient's current medications.  ASSESSMENT: Patient Active Problem List  Diagnoses  . Bleeding in early pregnancy  . Fibromyalgia  . Low back pain  . AMA (advanced maternal age) multigravida 35+  . Obesity  . Viral URI  . Pelvic relaxation  . History of urinary retention  . Hx of dysmenorrhea  . Fibroids  . Anemia  . Hypertension complicating pregnancy, childbirth and puerperium, antepartum  . Benign essential HTN  . Supervision of high-risk pregnancy  . ASCUS with positive high risk HPV  . History of cesarean delivery, currently pregnant  . Asthma complicating pregnancy, antepartum  . Acute asthma exacerbation  . Atelectasis  . Acute sinusitis  . Hypoxia  . Peripheral edema  . Weight gain  . OSA (obstructive sleep apnea)  . Hypertension  . Other dyspnea and respiratory abnormality    PLAN: Asthma with infectious process in pregnancy, being followed by pulmonology.  CT negative for PE, continue abx and diuresis  Cheyenne Arias H 09/23/2011,9:41 AM

## 2011-09-23 NOTE — Progress Notes (Signed)
SUBJ: Still gets winded with activity.  Able to decrease oxygen to 1 liter.  She does not feel like she is getting enough pressure from CPAP machine.  OBJ:  BP 102/56  Pulse 110  Temp(Src) 98.2 F (36.8 C) (Oral)  Resp 24  Ht 5' (1.524 m)  Wt 275 lb 12.8 oz (125.102 kg)  BMI 53.86 kg/m2  SpO2 96%  LMP 02/19/2011   Intake/Output Summary (Last 24 hours) at 09/23/11 1301 Last data filed at 09/23/11 0800  Gross per 24 hour  Intake   2220 ml  Output   3250 ml  Net  -1030 ml   Wt Readings from Last 3 Encounters:  09/23/11 275 lb 12.8 oz (125.102 kg)  09/12/11 266 lb (120.657 kg)  09/10/11 270 lb (122.471 kg)   General - WOB improved, but still increased.  Able to speak in full sentences. HEENT - no sinus tenderness Cardiac - s1s2 regular, tachycardic Chest - basilar rales, inspiratory squeaks at bases Rt > Lt Abd - gravid uterus, non tender Ext - ankle edema Neuro - A&O x 3  CBC Lab Results  Component Value Date   WBC 21.1* 09/23/2011   HGB 9.6* 09/23/2011   HCT 32.5* 09/23/2011   MCV 66.5* 09/23/2011   PLT 438* 09/23/2011    BMET Lab Results  Component Value Date   CREATININE 0.51 09/23/2011   BUN 8 09/23/2011   NA 136 09/23/2011   K 3.7 09/23/2011   CL 99 09/23/2011   CO2 26 09/23/2011    BNP    Component Value Date/Time   PROBNP 128.9* 09/20/2011 0520    Echo 6/06>>mod LVH, EF 65 to 70%, small/mod pericardial effusion Doppler legs 6/07>>no DVT CT chest 6/08>>no PE, Lt base ATX/ASD, borderline cardiomegaly   IMP/PLAN A: Severe dyspnea - multifactorial  Asthma exacerbation with respiratory infection  Edema  Severe obesity with deconditioning             Atelectasis She is slowly improving. P: Continue prednisone 30 mg daily, and then taper to off as tolerated Continue pulmicort, xopenex nebulizer therapy Hold additional diuresis for now Complete course of ceftin 6/09 Mobilize as tolerated  A: HTN, Sinus tachycardia P: Continue metoprolol, norvasc  A:  OSA>>likely needs higher pressure settings while acutely ill P: Change to auto CPAP  A: Leukocytosis likely from acute stress, and steroids. P:  F/u CBC intermittently  Updated family at bedside.  [name], MD [facility] Pulmonary/Critical Care 09/23/2011, 1:01 PM Pager:  [phone] After 3pm call: [phone]

## 2011-09-23 NOTE — Progress Notes (Signed)
Called to room to give PRN treatment. No wheezing noted but "mother" said she needs it and "can't breathe". Pt states she had just got up to use the bathroom and became short of breath and started coughing. She did have a productive cough throughout treatment of saliva and white secretions. As the patient settled down from walking and being short of breath the coughing ended and pt seemed to be breathing easier. Pt kept falling asleep toward end of treatment. Lab came in as treatment finished. Doren Custard, RRT.

## 2011-09-24 MED ORDER — MAGIC MOUTHWASH W/LIDOCAINE: 15.0000 mL | Freq: Three times a day (TID) | ORAL | Status: DC

## 2011-09-24 MED ORDER — MAGIC MOUTHWASH
15.0000 mL | Freq: Three times a day (TID) | ORAL | Status: AC
  Administered 2011-09-25 – 2011-09-27 (×9): 15 mL via ORAL
  Filled 2011-09-24 (×14): qty 15

## 2011-09-24 MED ORDER — PREDNISONE 20 MG PO TABS
20.0000 mg | ORAL_TABLET | Freq: Every day | ORAL | Status: DC
  Administered 2011-09-25: 20 mg via ORAL
  Filled 2011-09-24 (×2): qty 1

## 2011-09-24 NOTE — Care Management Note (Unsigned)
Page 1 of 2   09/28/2011     3:58:19 PM   CARE MANAGEMENT NOTE 09/28/2011  Patient:  Cheyenne Arias, Cheyenne Arias   Account Number:  [id number]  Date Initiated:  09/24/2011  Documentation initiated by:  Emilio Math  Subjective/Objective Assessment:   patient has hx of: fibromyalgia, sleep apnea, HTN, degenerative disc disease and asthma     Action/Plan:   Home w/ [place] 02, Nebulizer Machine and [employer].   Anticipated DC Date:  09/28/2011   Anticipated DC Plan:  HOME W HOME HEALTH SERVICES      DC Planning Services  CM consult      Choice offered to / List presented to:  C-1 Patient   DME arranged  OXYGEN  NEBULIZER MACHINE  WALKER - ROLLING      DME agency  [facility]        Status of service:  Completed, signed off  Discharge Disposition:  HOME W HOME HEALTH SERVICES  Per UR Regulation:  Reviewed for med. necessity/level of care/duration of stay  Comments:  09/28/11  1430p - Notified by pts Nurse that Pulmonologist had made rounds but did not address dc needs and OB MD was currently on unit writing dc orders.  CM to unit and assisted OB MD in placing home health orders.  Spoke w/ pt to verify address and phone numbers, information faxed to [facility] and spoke w/ Greggory Stallion who stated that they did receive the information.  Stated that they would be delivering the 02 to the pts room.  Nurse aware and will call CM if any questions.  CM available to assist as needed.  Hessie Diener  [phone]  09/28/11  900a- CM received call from Nurse on unit who stated that pt may need CM services - explained that CM is aware of pt and possible needs.  Reviewed progress notes and see that OB MD is awaiting Pulmonologist to see pt today and make recommendations on dc.  CM has already spoken to pt regarding home needs and has been in contact w/ [facility].  CM verified again today w/ Ms. Ward at [facility] [phone] correct number) that they could provide DME today, even after hours and over the weekend if  needed.  [facility] would need to provide the delivery person w/ a copy of the order.  If 02 is needed, a room air sat of less than or equal to 88% is required and needs to be documented in the chart. [facility]'s fax number is [phone].  CM will continue to follow and unit nursing staff aware to call CM when Pulmonologist rounds.  Tama High, RNBSN  [phone]  09/26/11 1630 L. Floyce Stakes RNC BSN- CM spoke to patient and patient stated she wants to use [facility] for DME and [facility] if there are any nursing needs upon discharge. Patient was not ready for discharge today and per Dr Evlyn Courier note she had increase SOB and swelling and lasix was ordered to give patient today. Unsure at this time when patients will be ready for discharge. Spoke to RT(Jessica) here at [facility] and she stated patient may need nebulizer and O2 for discharge. nurse care manager called [facility]#[phone] and spoke to [place] RT and referral intake rep. and let them know that patient may need o2 and nebulizer machine prior to discharge.  Our RT - Shanda Bumps charted today that patient's o2 sats on r/a sitting in bed were 74 % and increased after being placed on o2. (see progress note). [facility] fax#[phone]- please call nurse  care manager for assistance to set up [facility] needs prior to discharge. Nurse care manager will assist with needs prior to going home. Patient does have a Education administrator from [facility] ( see note below) for 2 hours 45 min a day mon-friday which will resume when discharge from hospital. Nurse Care Manager to call [facility] to notify them of discharge date. Patient does have her own CPAP in room and RT per Shanda Bumps has changed the settings to reflect Dr Evlyn Courier new orders with her CPAP settings.  09/25/11 14:30 H. Montez Morita, RN, BSN- CM spoke with patient's RN, Judeth Cornfield, as well as RT, Dorene Sorrow, about patient's status. No specified discharge needs at this time. Per Dr. Evlyn Courier progress note, patient likely will not  need oxygen at discharge. CM will continue to follow until discharge.  09/24/11 1600 L. Floyce Stakes [facility] BSN- reviewed patient's chart and while discussing plan of care with patient's RN on unit she stated she thought the patient had some type of "home care services prior to admission to the hospital". Nurse care manager went and talked to patient about services patient has been receiving prior to admission to [facility] hospital.  Patient's primary MD is Dr Windle Guard at [facility] # [phone].  Her hematologist is Dr Dalene Carrow #[phone].  Patient tells nurse care manager that she has a Certified nursing assitant that she receives care for Monday-Friday 2 hours and 45 min a day from the agency [facility] # [phone].  She has received care from them for over 2 years per patientdue to her fibromyalgia and HTN.   Nurse Care Manager called and spoke to Mrs. Hamilton  at [employer] she stated upon discharge she would like for [place] to fax the DC summary to # [phone].  There has been a request from [facility] to [facility] to see if patient can be approved for more hours of CNA at home. Plan is patient to continue that upon discharge for help with her ADL's. Patient states she uses [facility] for her DME and that is who her CPAP machine is through.  She would like to continue to use [facility] for any DME needs she may have. [facility] # [id number]. Patient has a Mining engineer wheelchair, shower chair and bedside commode at home.  Patient requests a walker if MD feels like patient needs one. If so would you place an order for a walker. If patient requires O2 for home use please have RN staff on unit within 2 days of discharge chart a o2 saturation in progress notes.  Nurse Care Manager gave RN on unit a guidelines of what needs to be charted for Home Health to provide O2 for patient for home use. If patient needs Nebulizer machine for home use  will MD place order in preparation for discharge. Home Health Care List provided and given to patient and if patient needs any RN [facility] services then patient chooses [facility]. Nurse Care Manager will follow and set up any needs for patient prior to discharge.

## 2011-09-24 NOTE — Progress Notes (Signed)
Patient ID: Cheyenne Arias, female   DOB: 06-11-1975, 36 y.o.   MRN: [id number] Patient ID: Cheyenne Arias, female   DOB: 07/03/1975, 36 y.o.   MRN: [id number] FACULTY PRACTICE ANTEPARTUM(COMPREHENSIVE) NOTE  [name] is a 36 y.o. G3P2002 at [redacted]w[redacted]d by early ultrasound who is admitted for asthma exaserbation.   Fetal presentation is transverse. Length of Stay:  10  Days  Subjective: Patient upset about missing daughter's graduation but otherwise basically stable Patient reports the fetal movement as active. Patient reports uterine contraction  activity as none. Patient reports  vaginal bleeding as none. Patient describes fluid per vagina as None.  Vitals:  Blood pressure 123/51, pulse 120, temperature 99.1 F (37.3 C), temperature source Axillary, resp. rate 23, height 5' (1.524 m), weight 125.102 kg (275 lb 12.8 oz), last menstrual period 02/19/2011, SpO2 99.00%. Physical Examination:  Fetal Monitoring:  Baseline: 140 bpm, Variability: Good {> 6 bpm), Accelerations: Non-reactive but appropriate for gestational age and Decelerations: Absent  Labs:  No results found for this or any previous visit (from the past 24 hour(s)).  Imaging Studies:    Ct Angio Chest W/cm &/or Wo Cm  09/22/2011  *RADIOLOGY REPORT*  Clinical Data: Shortness of breath.  Asthma.  Chest pressure.  The patient is [redacted] weeks pregnant and was shielded.  CT ANGIOGRAPHY CHEST  Technique:  Multidetector CT imaging of the chest using the standard protocol during bolus administration of intravenous contrast. Multiplanar reconstructed images including MIPs were obtained and reviewed to evaluate the vascular anatomy.  Contrast: OMNIPAQUE IOHEXOL 300 MG/ML  SOLN  Comparison: Chest radiograph 09/19/2011  Findings:  There is slight limitation in evaluation of the subsegmental pulmonary arteries due to the IV location within the patient's hand, and patient body habitus. The visualized portion of the thyroid gland and  thoracic inlet are normal.  The thoracic aorta is normal in caliber.  No evidence of aortic dissection. Heart size is upper normal.  No focal filling defect is identified within the pulmonary arteries to suggest pulmonary embolism.  Negative for pleural or pericardial effusion.  The distal thoracic esophagus is mildly distended with air.  Negative for lymphadenopathy.  There is a somewhat linearly oriented patchy opacity in the left lung base.  This could reflect atelectasis and/or airspace disease. Suggest correlation with any clinical signs of pneumonia.  The trachea mainstem bronchi are patent.  There is focal atelectasis in the right lower lobe.  Negative for pulmonary mass.  No acute bony abnormality.  No acute findings identified in the upper abdomen.  IMPRESSION: 1.  No evidence of pulmonary embolism.  Evaluation of the subsegmental pulmonary arteries is slightly limited by patient body habitus, and position of the patient's IV within the hand. 2.  Patchy opacity in the left lower lobe could reflect atelectasis and/or airspace disease.  Question if the patient has any clinical signs or symptoms of pneumonia. 3.  Gaseous dilatation of the distal thoracic esophagus with air. Although this is nonspecific, it can be seen in patients with gastroesophageal reflux. 4.  Borderline cardiomegaly.  Original Report Authenticated By: Britta Mccreedy, M.D.    Medications:  Scheduled    . amLODipine  10 mg Oral Daily  . budesonide  0.5 mg Nebulization BID  . cefUROXime  500 mg Oral BID WC  . docusate sodium  100 mg Oral Daily  . fluticasone  2 spray Each Nare BID  . levalbuterol  0.63 mg Nebulization Q6H  . loratadine  10 mg Oral  Daily  . metoprolol tartrate  25 mg Oral BID  . predniSONE  30 mg Oral Q breakfast  . prenatal vitamin w/FE, FA  1 tablet Oral Daily  . sodium chloride  3 mL Intravenous Q12H   I have reviewed the patient's current medications.  ASSESSMENT: Patient Active Problem List  Diagnoses    . Bleeding in early pregnancy  . Fibromyalgia  . Low back pain  . AMA (advanced maternal age) multigravida 35+  . Obesity  . Viral URI  . Pelvic relaxation  . History of urinary retention  . Hx of dysmenorrhea  . Fibroids  . Anemia  . Hypertension complicating pregnancy, childbirth and puerperium, antepartum  . Benign essential HTN  . Supervision of high-risk pregnancy  . ASCUS with positive high risk HPV  . History of cesarean delivery, currently pregnant  . Asthma complicating pregnancy, antepartum  . Acute asthma exacerbation  . Atelectasis  . Acute sinusitis  . Hypoxia  . Peripheral edema  . Weight gain  . OSA (obstructive sleep apnea)  . Hypertension  . Other dyspnea and respiratory abnormality    PLAN: Dr Evlyn Courier note and care is appreciated.  No change in fetal status.  Slow improvement in patient's respiratory condition  Monquie Fulgham H 09/24/2011,7:39 AM

## 2011-09-24 NOTE — Progress Notes (Signed)
Pt ambulated with portable oxygen and iv pole to nurses station with RN, NT, and her mother.  Pt sat in wheelchair when arrived to desk x 5-10 min.  Pt ambulated back to room after sitting with RN, NT and her mother at her side.

## 2011-09-24 NOTE — Progress Notes (Signed)
Called to bedside for prn breathing treatment. Listened to breath sounds and were clear throughout. RN agreed with assessment at this time. Notified patient that she sounded clear and asked if anything had occurred to start her feeling short of breath. Her "mother" was very upset and answered for her by insisting she was wheezing and "she knows what she heard". Treatment given with mask as patient fell asleep. Mother insisted patient will not last 6 hours until the next treatment. Noticed that when patient is breathing she seems to be forcing her exhalation and at the end "causing expiratory wheezing sound from her throat". This is not heard in her lung fields.Marland KitchenMarland KitchenShe seems well expanded during her deep breathing. Coughing every once in a while throughout treatment and was productive of some white secretions. At end of this prn treatment we decided to try the cpap machine and hooked up the 2 lpm of oxygen to the machine. After  hooking up the machine patient decided she needed to go to the bathroom. We took the mask off and i waited for patient to finish. When patient finished i again hooked her up to CPAP machine and watched sats and RR. Pt again fell asleep.Sats remained 96-98%.  Notified mother that RT will return for breathing treatment around 7:30am or 8am and she again stated she won't make it til then. I said if she needed one sooner to please call. Doren Custard, RRT.

## 2011-09-24 NOTE — Progress Notes (Signed)
SUBJ: Breathing better at rest.  She is now able to speak w/o having to catch her breath.  CPAP settings not adjusted yesterday.  She felt anxious and "pressure" in her chest when her oxygen was decreased to 1 liter (she maintained her SpO2 > 95%).  She emphasizes how her fibromyalgia is affecting her breathing.   OBJ:  BP 125/54  Pulse 125  Temp(Src) 98.3 F (36.8 C) (Axillary)  Resp 28  Ht 5' (1.524 m)  Wt 275 lb 12.8 oz (125.102 kg)  BMI 53.86 kg/m2  SpO2 100%  LMP 02/19/2011   Intake/Output Summary (Last 24 hours) at 09/24/11 1122 Last data filed at 09/24/11 1000  Gross per 24 hour  Intake   2820 ml  Output   3400 ml  Net   -580 ml   Wt Readings from Last 3 Encounters:  09/23/11 275 lb 12.8 oz (125.102 kg)  09/12/11 266 lb (120.657 kg)  09/10/11 270 lb (122.471 kg)   General - No increase in WOB at rest.  Able to speak in full sentences. HEENT - no sinus tenderness Cardiac - s1s2 regular, tachycardic Chest - clear Abd - gravid uterus, non tender Ext - minimal edema Neuro - A&O x 3  CBC Lab Results  Component Value Date   WBC 21.1* 09/23/2011   HGB 9.6* 09/23/2011   HCT 32.5* 09/23/2011   MCV 66.5* 09/23/2011   PLT 438* 09/23/2011    BMET Lab Results  Component Value Date   CREATININE 0.51 09/23/2011   BUN 8 09/23/2011   NA 136 09/23/2011   K 3.7 09/23/2011   CL 99 09/23/2011   CO2 26 09/23/2011    BNP    Component Value Date/Time   PROBNP 128.9* 09/20/2011 0520    Echo 6/06>>mod LVH, EF 65 to 70%, small/mod pericardial effusion Doppler legs 6/07>>no DVT CT chest 6/08>>no PE, Lt base ATX/ASD, borderline cardiomegaly   IMP/PLAN A: Severe dyspnea - multifactorial  Asthma exacerbation with respiratory infection  Edema  Severe obesity with deconditioning             Atelectasis             Anxiety with VCD She is slowly improving. P: Change prednisone 20 mg daily, and then taper to off as tolerated Continue pulmicort, xopenex nebulizer therapy to limit upper  airway irritation Hold additional diuresis for now Completed course of ceftin 6/09 Mobilize as tolerated  A: Hypoxia P: Titrate oxygen to keep SpO2 > 95%>>pt has been reluctant to have oxygen decreased even though SpO2 has been adequate  A: HTN, Sinus tachycardia P: Continue metoprolol, norvasc Check TSH  A: OSA>>likely needs higher pressure settings while acutely ill P: Change to auto CPAP>>discussed with RT about needed changes  A: Leukocytosis likely from acute stress, and steroids. P:  F/u CBC  A: Anxiety, fibromyalgia.  Likely playing role in her sense of dyspnea. P: Defer to primary team  Likely will be ready for d/c home soon.  I am concerned she may be reluctant to go home w/o oxygen.  [name], MD [facility] Pulmonary/Critical Care 09/24/2011, 11:22 AM Pager:  [phone] After 3pm call: [phone]

## 2011-09-25 LAB — BASIC METABOLIC PANEL
BUN: 4 mg/dL — ABNORMAL LOW (ref 6–23)
CO2: 26 mEq/L (ref 19–32)
Chloride: 101 mEq/L (ref 96–112)
Glucose, Bld: 94 mg/dL (ref 70–99)
Potassium: 3.7 mEq/L (ref 3.5–5.1)
Sodium: 137 mEq/L (ref 135–145)

## 2011-09-25 LAB — CBC
HCT: 32.5 % — ABNORMAL LOW (ref 36.0–46.0)
Hemoglobin: 9.5 g/dL — ABNORMAL LOW (ref 12.0–15.0)
MCH: 19.6 pg — ABNORMAL LOW (ref 26.0–34.0)
MCHC: 29.2 g/dL — ABNORMAL LOW (ref 30.0–36.0)
MCV: 67 fL — ABNORMAL LOW (ref 78.0–100.0)
RBC: 4.85 MIL/uL (ref 3.87–5.11)

## 2011-09-25 MED ORDER — PREDNISONE 10 MG PO TABS
10.0000 mg | ORAL_TABLET | Freq: Every day | ORAL | Status: DC
  Administered 2011-09-26 – 2011-09-28 (×3): 10 mg via ORAL
  Filled 2011-09-25 (×4): qty 1

## 2011-09-25 NOTE — Progress Notes (Signed)
SUBJ: Slept better after change to CPAP settings last night.  Walked in halls some.  Got dizzy and short of breath, but was able to maintain oxygen sats.  Breathing is better at rest.  OBJ:  BP 110/31  Pulse 119  Temp(Src) 98.2 F (36.8 C) (Oral)  Resp 24  Ht 5' (1.524 m)  Wt 271 lb 12.8 oz (123.288 kg)  BMI 53.08 kg/m2  SpO2 95%  LMP 02/19/2011   Intake/Output Summary (Last 24 hours) at 09/25/11 1341 Last data filed at 09/25/11 0600  Gross per 24 hour  Intake   1600 ml  Output   2450 ml  Net   -850 ml   Wt Readings from Last 3 Encounters:  09/25/11 271 lb 12.8 oz (123.288 kg)  09/12/11 266 lb (120.657 kg)  09/10/11 270 lb (122.471 kg)   General - No distress HEENT - no sinus tenderness Cardiac - s1s2 regular, tachycardic Chest - clear Abd - gravid uterus, non tender Ext - no edema Neuro - A&O x 3  CBC Lab Results  Component Value Date   WBC 19.2* 09/25/2011   HGB 9.5* 09/25/2011   HCT 32.5* 09/25/2011   MCV 67.0* 09/25/2011   PLT 395 09/25/2011    BMET Lab Results  Component Value Date   CREATININE 0.46* 09/25/2011   BUN 4* 09/25/2011   NA 137 09/25/2011   K 3.7 09/25/2011   CL 101 09/25/2011   CO2 26 09/25/2011    BNP    Component Value Date/Time   PROBNP 128.9* 09/20/2011 0520   Lab Results  Component Value Date   TSH 2.385 06/13/2011    Echo 6/06>>mod LVH, EF 65 to 70%, small/mod pericardial effusion Doppler legs 6/07>>no DVT CT chest 6/08>>no PE, Lt base ATX/ASD, borderline cardiomegaly   IMP/PLAN A: Severe dyspnea - multifactorial  Asthma exacerbation with respiratory infection  Edema  Severe obesity with deconditioning             Atelectasis             Anxiety with VCD She is slowly improving. P: Change prednisone to 10 mg daily from 6/12, and continue taper to off as tolerated Continue pulmicort, xopenex nebulizer therapy to limit upper airway irritation Hold additional diuresis for now Completed course of ceftin 6/09 Mobilize as  tolerated  A: Hypoxia P: Explained to patient that may be difficult for home oxygen set up since she would need SpO2 < 88% for reimbursement of home oxygen Wean down oxygen as tolerated, and check SpO2 with exertion  A: HTN, Sinus tachycardia P: Continue metoprolol, norvasc Check TSH  A: OSA>>likely needs higher pressure settings while acutely ill P: Changed to auto CPAP range 5 to 15 cm H2O Explained that she should use CPAP when taking naps also  A: Leukocytosis likely from acute stress, and steroids. P:  F/u CBC intermittently  A: Anxiety, fibromyalgia.  Likely playing role in her sense of dyspnea. P: Defer to primary team  A: Anemia P: Defer to primary team  Likely will be ready for d/c home soon.  Explained that oxygen set up at home may not be needed.  [name], MD [facility] Pulmonary/Critical Care 09/25/2011, 1:41 PM Pager:  [phone] After 3pm call: [phone]

## 2011-09-25 NOTE — Progress Notes (Signed)
Patient ID: Cheyenne Arias, female   DOB: March 29, 1976, 36 y.o.   MRN: [id number] FACULTY PRACTICE ANTEPARTUM(COMPREHENSIVE) NOTE  Cheyenne Arias is a 36 y.o. G3P2002 at [redacted]w[redacted]d by early ultrasound who is admitted for asthma exaserbation.   Fetal presentation is transverse. Length of Stay:  10  Days  Subjective: Patient reports feeling a little better but unable to ambulate outside of room without feeling short of breath and lightheaded Patient reports the fetal movement as active. Patient reports uterine contraction  activity as none. Patient reports  vaginal bleeding as none. Patient describes fluid per vagina as None.  Vitals:  Blood pressure 122/45, pulse 120, temperature 98.1 F (36.7 C), temperature source Oral, resp. rate 21, height 5' (1.524 m), weight 124.195 kg (273 lb 12.8 oz), last menstrual period 02/19/2011, SpO2 100.00%. Physical Examination: Lungs: b/l wheezes at lung base Abd: Soft/gravid/NT/ obese Ext: NT, equal in size  Fetal Monitoring: baseline 150, mod variability, non-reactive but appropriate for gestational age  Labs:  Recent Results (from the past 24 hour(s))  BASIC METABOLIC PANEL   Collection Time   09/25/11  5:25 AM      Component Value Range   Sodium 137  135 - 145 (mEq/L)   Potassium 3.7  3.5 - 5.1 (mEq/L)   Chloride 101  96 - 112 (mEq/L)   CO2 26  19 - 32 (mEq/L)   Glucose, Bld 94  70 - 99 (mg/dL)   BUN 4 (*) 6 - 23 (mg/dL)   Creatinine, Ser 4.09 (*) 0.50 - 1.10 (mg/dL)   Calcium 8.9  8.4 - 81.1 (mg/dL)   GFR calc non Af Amer >90  >90 (mL/min)   GFR calc Af Amer >90  >90 (mL/min)  CBC   Collection Time   09/25/11  5:25 AM      Component Value Range   WBC 19.2 (*) 4.0 - 10.5 (K/uL)   RBC 4.85  3.87 - 5.11 (MIL/uL)   Hemoglobin 9.5 (*) 12.0 - 15.0 (g/dL)   HCT 91.4 (*) 78.2 - 46.0 (%)   MCV 67.0 (*) 78.0 - 100.0 (fL)   MCH 19.6 (*) 26.0 - 34.0 (pg)   MCHC 29.2 (*) 30.0 - 36.0 (g/dL)   RDW 95.6 (*) 21.3 - 15.5 (%)   Platelets 395  150 - 400  (K/uL)    Imaging Studies:    No results found.  Medications:  Scheduled    . amLODipine  10 mg Oral Daily  . budesonide  0.5 mg Nebulization BID  . docusate sodium  100 mg Oral Daily  . fluticasone  2 spray Each Nare BID  . levalbuterol  0.63 mg Nebulization Q6H  . loratadine  10 mg Oral Daily  . magic mouthwash  15 mL Oral TID  . metoprolol tartrate  25 mg Oral BID  . predniSONE  20 mg Oral Q breakfast  . prenatal vitamin w/FE, FA  1 tablet Oral Daily  . sodium chloride  3 mL Intravenous Q12H  . DISCONTD: magic mouthwash w/lidocaine  15 mL Oral TID  . DISCONTD: predniSONE  30 mg Oral Q breakfast   I have reviewed the patient's current medications.  ASSESSMENT: Patient Active Problem List  Diagnoses  . Bleeding in early pregnancy  . Fibromyalgia  . Low back pain  . AMA (advanced maternal age) multigravida 35+  . Obesity  . Viral URI  . Pelvic relaxation  . History of urinary retention  . Hx of dysmenorrhea  . Fibroids  . Anemia  .  Hypertension complicating pregnancy, childbirth and puerperium, antepartum  . Benign essential HTN  . Supervision of high-risk pregnancy  . ASCUS with positive high risk HPV  . History of cesarean delivery, currently pregnant  . Asthma complicating pregnancy, antepartum  . Acute asthma exacerbation  . Atelectasis  . Acute sinusitis  . Hypoxia  . Peripheral edema  . Weight gain  . OSA (obstructive sleep apnea)  . Hypertension  . Other dyspnea and respiratory abnormality    PLAN: Fetal status remains stable Maternal respiratory status slowly improving Continue current care Apreaciate note from pulmonologist   [name] 09/25/2011,7:22 AM

## 2011-09-25 NOTE — Progress Notes (Signed)
Pt out in the hall to attempt walking to the desk. Pt up to void at [facility] first without O2 on. After void and before leaving the room, O2 sats 98, HR 123. Pt slowly walked to desk, c/o dizziness, leaning on the desk for support - O2 sat 95/96, HR 120. Pt request to sit down for a minute. After patient encouraged to return to the room, pt able to walk to the door way then requesting to sit down feeling dizzy and hot. O2 sats 92, HR 118. After sitting for 30secs - O2 sats back up to 95/96. Pt requesting to scoot back to bed in the rolling chair. O2 sat monitor applied and O2 remained 96. Pt requesting O2 by nasal canal. Reapplied at 1.5L.

## 2011-09-25 NOTE — Progress Notes (Signed)
Adjusted CPAP settings to auto titrate minimum 5.0cmH20 and maximum 15.0cmH20 per MD order. Pt wears CPAP at home with FFM. Humidified and heated also. RT will continue to monitor.

## 2011-09-26 MED ORDER — DIPHENHYDRAMINE HCL 25 MG PO CAPS
25.0000 mg | ORAL_CAPSULE | Freq: Once | ORAL | Status: AC
  Administered 2011-09-26: 25 mg via ORAL
  Filled 2011-09-26: qty 1

## 2011-09-26 MED ORDER — FUROSEMIDE 40 MG PO TABS
40.0000 mg | ORAL_TABLET | Freq: Once | ORAL | Status: AC
  Administered 2011-09-26: 40 mg via ORAL
  Filled 2011-09-26: qty 1

## 2011-09-26 NOTE — Progress Notes (Signed)
Came to see patient for her afternoon breathing treatment. Pt was sitting in bed on room air without pulse ox and once I placed it on her,  her oxygen saturation was initally 74 % then after a few minuites it came up to 82 that was at  about 1555, pt was placed back on [place] at 1 liter and oxygen saturations came up to 100 and have been staying above 97.

## 2011-09-26 NOTE — Progress Notes (Signed)
Pt was given HHN treatment of Xopenex and Pulmicort, upon arrival pt was very SOB and had Insp and Exp wheezes that were audible, pt stated that she had gotten up for a brief moment proir to my arrival. Pt's breathing is improving with the nebulizer but she was in a lot of distress prior to treatment. MDI's at home probably would not be enough for patient, she may need Nebulizer treatments for home since she was so tight and wheezing badly this morning.

## 2011-09-26 NOTE — Progress Notes (Signed)
02 decreased to 1L [place].

## 2011-09-26 NOTE — Progress Notes (Signed)
List provided and given to patient  Agencies that are Medicare-Certified and are affiliated with [facility]  Telephone Number Address  [facility]   The [facility] has ownership interest in this company; however, you are under no obligation to use this agency. [phone] or  [phone] [address] [place], [place] [address]   Agencies that are Medicare-Certified and are not affiliated with [facility] Telephone Number Address  [facility] [phone] Fax [phone] [address] [place], [place]  [address]  [facility] [phone] or [phone] Fax [phone] [address] [place], [place] [address]  [facility] [phone] Fax [phone] [address] [place], [place] [address]  [facility] [phone] Fax [phone] [address] [place], [place]  [address]  [facility] [phone] Fax [phone] [address] [place], [place] [address]  [facility] [phone] [address] [place], [place] [address]  [facility] [phone]  [address] [place], [place] [address]  [facility] [phone] or [phone] Fax [phone] [address] [place], [place]  [address]  [facility] [phone] Fax [phone] [address] [place], [place]  [address]  [facility]  [phone] Fax [phone] [address] [place], [place] [address]               Agencies that are not Medicare-Certified and are not affiliated with [facility] Telephone Number Address  [facility] [place]  [phone] or [phone] Fax [phone] [address], [place]  [address]  [facility] [phone] Fax [phone] [address] [place], [place]  [address]  [facility]  [phone] Fax [phone] [address] [place], [place] [address]  [facility] [phone] Fax [phone] [address] [place], [place] [address]  [facility] [phone] or [phone] Fax [phone] [address] [place], [place]  [address]  [facility] [phone] or [phone] Fax [phone] [address] [place], [place]  [address]  [facility] [phone] Fax [phone] [address] [place], [place]  [address]  [facility] [phone] [address] [place], [place]  [address]  [facility] [phone] Fax [phone] [address] [place], [place]  [address]  [facility] [phone] Fax [phone] [address] [place], [place]  [address]  [facility] [phone] Fax [phone] [address] [place], [place] [address]  [facility] [phone] Fax [phone] [address] [place], [place]  [address]

## 2011-09-26 NOTE — Progress Notes (Signed)
SUBJ: More short of breath today.  Leg swelling increased.  OBJ:  BP 115/59  Pulse 112  Temp 98.4 F (36.9 C) (Oral)  Resp 22  Ht 5' (1.524 m)  Wt 271 lb 12.8 oz (123.288 kg)  BMI 53.08 kg/m2  SpO2 93%  LMP 02/19/2011   Intake/Output Summary (Last 24 hours) at 09/26/11 1341 Last data filed at 09/26/11 1124  Gross per 24 hour  Intake      0 ml  Output    175 ml  Net   -175 ml   Wt Readings from Last 3 Encounters:  09/25/11 271 lb 12.8 oz (123.288 kg)  09/12/11 266 lb (120.657 kg)  09/10/11 270 lb (122.471 kg)   General - increased WOB HEENT - no sinus tenderness Cardiac - s1s2 regular, tachycardic Chest - basilar rales Abd - gravid uterus, non tender Ext - pretibial edema Neuro - A&O x 3  CBC Lab Results  Component Value Date   WBC 19.2* 09/25/2011   HGB 9.5* 09/25/2011   HCT 32.5* 09/25/2011   MCV 67.0* 09/25/2011   PLT 395 09/25/2011    BMET Lab Results  Component Value Date   CREATININE 0.46* 09/25/2011   BUN 4* 09/25/2011   NA 137 09/25/2011   K 3.7 09/25/2011   CL 101 09/25/2011   CO2 26 09/25/2011    BNP    Component Value Date/Time   PROBNP 128.9* 09/20/2011 0520   Lab Results  Component Value Date   TSH 1.596 09/25/2011    Echo 6/06>>mod LVH, EF 65 to 70%, small/mod pericardial effusion Doppler legs 6/07>>no DVT CT chest 6/08>>no PE, Lt base ATX/ASD, borderline cardiomegaly   IMP/PLAN A: Severe dyspnea - multifactorial  Asthma exacerbation with respiratory infection  Edema  Severe obesity with deconditioning             Atelectasis             Anxiety with VCD  Worse on 6/12 likely from increased edema P: Changed prednisone to 10 mg daily from 6/12, and continue taper to off as tolerated Continue pulmicort, xopenex nebulizer therapy to limit upper airway irritation Give dose of lasix 40 mg x one 6/12 Completed course of ceftin 6/09 Mobilize as tolerated  A: Hypoxia P: Explained to patient that may be difficult for home oxygen set up  since she would need SpO2 < 88% for reimbursement of home oxygen Wean down oxygen as tolerated, and check SpO2 with exertion Assess for home oxygen >> walk in hall on room air while checking pulse ox  A: HTN, Sinus tachycardia P: Continue metoprolol, norvasc  A: OSA>>likely needs higher pressure settings while acutely ill P: Changed to auto CPAP range 5 to 15 cm H2O Explained that she should use CPAP when taking naps also  A: Leukocytosis likely from acute stress, and steroids. P:  F/u CBC intermittently  A: Anxiety, fibromyalgia.  Likely playing role in her sense of dyspnea. P: Defer to primary team  A: Anemia P: Defer to primary team  Will assess response to additional diuresis, and then determine when she would be ready for d/c home.  [name], MD [facility] Pulmonary/Critical Care 09/26/2011, 1:41 PM Pager:  [phone] After 3pm call: [phone]

## 2011-09-26 NOTE — Progress Notes (Signed)
Dr Debroah Loop notified of o2 sats off and on room air and o2. md informed of dr sood order to ambulate in hall. Will continue to observe.

## 2011-09-26 NOTE — Progress Notes (Signed)
Patient ID: Cheyenne Arias, female   DOB: May 24, 1975, 36 y.o.   MRN: [id number] FACULTY PRACTICE ANTEPARTUM(COMPREHENSIVE) NOTE  Cheyenne Arias is a 36 y.o. G3P2002 at [redacted]w[redacted]d by best clinical estimate who is admitted for acute asthma exacerbation, with very slow improvement in status.  She ambulated yesterday with little decrease in O2.   Fetal presentation is unsure. Length of Stay:  11  Days  Subjective: Pt. Is sleeping and does not awaken.  She is sleeping without her CPAP.   Vitals:  Blood pressure 120/57, pulse 113, temperature 98 F (36.7 C), temperature source Oral, resp. rate 24, height 5' (1.524 m), weight 123.288 kg (271 lb 12.8 oz), last menstrual period 02/19/2011, SpO2 97.00%. On 1 L O2 by nasal canula Physical Examination:  General appearance - overweight and rapid respiratory rate Chest - wheezing noted R>L, much improved air movement Abdomen - soft, nontender, nondistended, no masses or organomegaly Fundal Height:  size equals dates Extremities: extremities normal, atraumatic, no cyanosis or edema   Fetal Monitoring:  Baseline: 140 bpm, Variability: Good {> 6 bpm), Accelerations: Non-reactive but appropriate for gestational age and Decelerations: Variable: mild   Medications:  Scheduled    . amLODipine  10 mg Oral Daily  . budesonide  0.5 mg Nebulization BID  . docusate sodium  100 mg Oral Daily  . fluticasone  2 spray Each Nare BID  . levalbuterol  0.63 mg Nebulization Q6H  . loratadine  10 mg Oral Daily  . magic mouthwash  15 mL Oral TID  . metoprolol tartrate  25 mg Oral BID  . predniSONE  10 mg Oral Q breakfast  . prenatal vitamin w/FE, FA  1 tablet Oral Daily  . DISCONTD: predniSONE  20 mg Oral Q breakfast  . DISCONTD: sodium chloride  3 mL Intravenous Q12H   I have reviewed the patient's current medications.  ASSESSMENT: Patient Active Problem List  Diagnosis  . Bleeding in early pregnancy  . Fibromyalgia  . Low back pain  . AMA (advanced  maternal age) multigravida 35+  . Obesity  . Viral URI  . Pelvic relaxation  . History of urinary retention  . Hx of dysmenorrhea  . Fibroids  . Anemia  . Hypertension complicating pregnancy, childbirth and puerperium, antepartum  . Benign essential HTN  . Supervision of high-risk pregnancy  . ASCUS with positive high risk HPV  . History of cesarean delivery, currently pregnant  . Asthma complicating pregnancy, antepartum  . Acute asthma exacerbation  . Atelectasis  . Acute sinusitis  . Hypoxia  . Peripheral edema  . Weight gain  . OSA (obstructive sleep apnea)  . Hypertension  . Other dyspnea and respiratory abnormality    PLAN: Continue to improve ambulation and function.  Consider D/C later today or tomorrow.  Cheyenne Arias S 09/26/2011,7:11 AM

## 2011-09-27 DIAGNOSIS — J9819 Other pulmonary collapse: Secondary | ICD-10-CM

## 2011-09-27 DIAGNOSIS — J45901 Unspecified asthma with (acute) exacerbation: Secondary | ICD-10-CM

## 2011-09-27 DIAGNOSIS — O9989 Other specified diseases and conditions complicating pregnancy, childbirth and the puerperium: Secondary | ICD-10-CM

## 2011-09-27 DIAGNOSIS — J984 Other disorders of lung: Secondary | ICD-10-CM

## 2011-09-27 NOTE — Progress Notes (Signed)
RN remained at the bedside to attempt continuous fetal tracing from 1604 - 1700: unsuccessful. Due to large abd- pt. severely obese with large abd., fetus very active. FHR in 135-140 bpm.  Pt. states she feels baby moving. Dr. Debroah Loop notified. No new orders received.

## 2011-09-27 NOTE — Progress Notes (Signed)
Patient ID: Cheyenne Arias, female   DOB: 03-Jun-1975, 36 y.o.   MRN: [id number] FACULTY PRACTICE ANTEPARTUM(COMPREHENSIVE) NOTE  Cheyenne Arias is a 36 y.o. G3P2002 at [redacted]w[redacted]d by best clinical estimate who is admitted for asthma exacerbation.   Fetal presentation is unsure. Length of Stay:  12  Days  Subjective: Some improvement in breathing today  Patient reports the fetal movement as active. Patient reports uterine contraction  activity as none. Patient reports  vaginal bleeding as none. Patient describes fluid per vagina as None.  Vitals:  Blood pressure 116/61, pulse 121, temperature 97.7 F (36.5 C), temperature source Axillary, resp. rate 20, height 5' (1.524 m), weight 121.609 kg (268 lb 1.6 oz), last menstrual period 02/19/2011, SpO2 94.00%. Physical Examination:  General appearance - acyanotic, in no respiratory distress Heart - normal rate and regular rhythm Chest- bibasilar rales and few wheezes Abdomen - soft, nontender, nondistended Fundal Height:  size equals dates Cervical Exam: Not evaluated. and found to be not evaluated/ / and fetal presentation is unsure. Extremities: extremities normal, atraumatic, no cyanosis or edema and Homans sign is negative, no sign of DVT with DTRs 2+ bilaterally Membranes:intact  Fetal Monitoring:  Baseline: 150 bpm  Labs:  No results found for this or any previous visit (from the past 24 hour(s)).  Imaging Studies:     Currently EPIC will not allow sonographic studies to automatically populate into notes.  In the meantime, copy and paste results into note or free text.  Medications:  Scheduled    . amLODipine  10 mg Oral Daily  . budesonide  0.5 mg Nebulization BID  . diphenhydrAMINE  25 mg Oral Once  . docusate sodium  100 mg Oral Daily  . fluticasone  2 spray Each Nare BID  . furosemide  40 mg Oral Once  . levalbuterol  0.63 mg Nebulization Q6H  . loratadine  10 mg Oral Daily  . magic mouthwash  15 mL Oral TID  .  metoprolol tartrate  25 mg Oral BID  . predniSONE  10 mg Oral Q breakfast  . prenatal vitamin w/FE, FA  1 tablet Oral Daily   I have reviewed the patient's current medications.  ASSESSMENT: Patient Active Problem List  Diagnosis  . Bleeding in early pregnancy  . Fibromyalgia  . Low back pain  . AMA (advanced maternal age) multigravida 35+  . Obesity  . Viral URI  . Pelvic relaxation  . History of urinary retention  . Hx of dysmenorrhea  . Fibroids  . Anemia  . Hypertension complicating pregnancy, childbirth and puerperium, antepartum  . Benign essential HTN  . Supervision of high-risk pregnancy  . ASCUS with positive high risk HPV  . History of cesarean delivery, currently pregnant  . Asthma complicating pregnancy, antepartum  . Acute asthma exacerbation  . Atelectasis  . Acute sinusitis  . Hypoxia  . Peripheral edema  . Weight gain  . OSA (obstructive sleep apnea)  . Hypertension  . Other dyspnea and respiratory abnormality     PLAN: Continue management of her pulmonary issues and try to arrange outpatient care when ready, Dr. Craige Cotta following  [name] 09/27/2011,12:36 PM

## 2011-09-27 NOTE — Progress Notes (Signed)
31 3/[redacted] weeks gestation, with Acute asthma exacerbation  and HTN.  Height  62 " Weight 268 Lbs pre-pregnancy weight 290 Lbs.Pre-pregnancy  BMI 53.2 (class III obesity, morbid obesity)  IBW 110 lbs  Total weight gain - 22 Lbs. Weight gain goals 11-20 Lbs.  Pt with unusual weight trend. Weight was 266 Lbs upon admission. During the course of hospitalization she gained 21 Lbs and is now back down to close to admission weight. Reported to have edema present and has been on diuretics  Estimated needs: 21-2300 kcal/day, 75-90 grams protein/day, 2.4 liters fluid/day Regular  diet tolerated well. Given reported difficulty ambulating and fatigue, Pt would benefit from having meals prepared for her after discharge. Current diet prescription will provide for increased needs. No abnormal nutrition related labs  Nutrition Dx: Increased nutrient needs r/t pregnancy and fetal growth requirements aeb [redacted] weeks gestation.  No educational needs assessed at this time.

## 2011-09-27 NOTE — Progress Notes (Signed)
RN remains at the bedside attempting to monitor fetus.  Pt. With complaint of sitting up, EFM adjusted, pt. With complaint of lying on back due to her fibromyalgia. Explained to patient that 20-30 minute continuous fetal tracing is necessary - per MD order, pt. Verbalized OK. Pt. Currently positioned on right side, snoring, EFM adjusted (EFM - 140s).

## 2011-09-27 NOTE — Progress Notes (Signed)
Pt seems to be breathing easier today, however she does still have exp wheezes upon ascultation. Pt has not been wearing her Home CPAP here at all and I talked to the patient today about the benefit of wearing the CPAP at night and pt stated that her CPAP was not clean and that's why she did not want to wear it. Pt's home CPAP appeared clean to me but I did take the unit out of the room and cleaned it very well and told patient that it is all clean now and to please attempt to use it tonight, pt agreed that she would try to use the CPAP tonight. Pt is always very friendly and kind. Currently she is on a [place] at 1 liter Sp02 is 94. Rt will continue to monitor and evaluate patient

## 2011-09-28 MED ORDER — AMLODIPINE BESYLATE 10 MG PO TABS: 10.0000 mg | ORAL_TABLET | Freq: Every day | ORAL | Status: DC

## 2011-09-28 MED ORDER — PANTOPRAZOLE SODIUM 40 MG PO TBEC: 40.0000 mg | DELAYED_RELEASE_TABLET | Freq: Every day | ORAL | Status: DC

## 2011-09-28 MED ORDER — METOPROLOL TARTRATE 25 MG PO TABS: 25.0000 mg | ORAL_TABLET | Freq: Two times a day (BID) | ORAL | Status: DC

## 2011-09-28 MED ORDER — LORATADINE 10 MG PO TABS: 10.0000 mg | ORAL_TABLET | Freq: Every day | ORAL | Status: DC

## 2011-09-28 MED ORDER — BUDESONIDE 0.5 MG/2ML IN SUSP: 0.5000 mg | Freq: Two times a day (BID) | RESPIRATORY_TRACT | Status: DC

## 2011-09-28 MED ORDER — DIPHENHYDRAMINE HCL 25 MG PO CAPS
25.0000 mg | ORAL_CAPSULE | ORAL | Status: DC | PRN
  Administered 2011-09-28: 25 mg via ORAL
  Filled 2011-09-28: qty 1

## 2011-09-28 MED ORDER — FLUTICASONE PROPIONATE 50 MCG/ACT NA SUSP: 2.0000 | Freq: Two times a day (BID) | NASAL | Status: DC

## 2011-09-28 MED ORDER — COMPLETENATE 29-1 MG PO CHEW: 1.0000 | CHEWABLE_TABLET | Freq: Every day | ORAL | Status: DC

## 2011-09-28 MED ORDER — LEVALBUTEROL HCL 0.63 MG/3ML IN NEBU: 0.6300 mg | INHALATION_SOLUTION | Freq: Four times a day (QID) | RESPIRATORY_TRACT | Status: DC

## 2011-09-28 MED ORDER — PREDNISONE 10 MG PO TABS: ORAL_TABLET | ORAL | Status: DC

## 2011-09-28 NOTE — Discharge Summary (Signed)
Physician Discharge Summary  Patient ID: Cheyenne Arias MRN: [id number] DOB/AGE: 37-14-1977 37 y.o.  Admit date: 09/15/2011 Discharge date: 09/28/2011  Admission Diagnoses:[redacted]w[redacted]d EGA with pulmonary disease  Discharge Diagnoses: same Principal Problem:  *Asthma complicating pregnancy, antepartum Active Problems:  Acute asthma exacerbation  Atelectasis  Acute sinusitis  Hypoxia  Peripheral edema  Weight gain  OSA (obstructive sleep apnea)  Hypertension  Other dyspnea and respiratory abnormality   Discharged Condition: stable  Hospital Course: She was admitted with a working diagnosis of asthma exacerbation and treated appropriately. Broad spectrum antibiotics were added. Pulmonary medicine was consulted. She was encouraged to use her CPAP machine and she stayed on O2 throughout her stay. By discharge day, pulmonary MD wrote that she was ok for discharge. The fetal monitoring throughout the stay was reassuring.  Consults: pulmonary/intensive care and MFM  Significant Diagnostic Studies: labs:  and radiology: CXR, CT to rule out pulmonary embolus, echocardiogram  Treatments: IV hydration, antibiotics and antihypertensives as well as continuing her steroids  Discharge Exam: Blood pressure 127/65, pulse 113, temperature 98.2 F (36.8 C), temperature source Oral, resp. rate 20, height 5' (1.524 m), weight 118.343 kg (260 lb 14.4 oz), last menstrual period 02/19/2011, SpO2 99.00%. General appearance: alert  Disposition: 01-Home or Self Care  Discharge Orders    Future Appointments: Provider: Department: Dept Phone: Center:   10/04/2011 10:30 AM Catalina Antigua, MD [facility] [phone] [facility]   02/27/2012 10:00 AM Dava Najjar Idelle Jo [facility] [phone] None   02/29/2012 12:00 PM Laurice Record, MD [facility] [phone] None     Medication List  As of 09/28/2011  2:45 PM   STOP taking these medications         predniSONE 20 MG tablet      sodium chloride  0.9 % SOLN 500 mL with iron dextran complex 50 MG/ML SOLN         TAKE these medications         albuterol 108 (90 BASE) MCG/ACT inhaler   Commonly known as: PROVENTIL HFA;VENTOLIN HFA   Inhale 2 puffs into the lungs every 6 (six) hours as needed. As needed for asthma/shortness of breath      amLODipine 10 MG tablet   Commonly known as: NORVASC   Take 1 tablet (10 mg total) by mouth daily.      budesonide 0.5 MG/2ML nebulizer solution   Commonly known as: PULMICORT   Take 2 mLs (0.5 mg total) by nebulization 2 (two) times daily.      cyclobenzaprine 10 MG tablet   Commonly known as: FLEXERIL   Take 10 mg by mouth 3 (three) times daily as needed.      diphenhydramine-acetaminophen 25-500 MG Tabs   Commonly known as: TYLENOL PM   Take 1 tablet by mouth at bedtime as needed. Takes for sleep      fluticasone 50 MCG/ACT nasal spray   Commonly known as: FLONASE   Place 2 sprays into the nose 2 (two) times daily.      labetalol 100 MG tablet   Commonly known as: NORMODYNE   Take 1 tablet (100 mg total) by mouth 2 (two) times daily.      levalbuterol 0.63 MG/3ML nebulizer solution   Commonly known as: XOPENEX   Take 3 mLs (0.63 mg total) by nebulization every 6 (six) hours.      loratadine 10 MG tablet   Commonly known as: CLARITIN   Take 1 tablet (10 mg total) by mouth daily.  metoprolol tartrate 25 MG tablet   Commonly known as: LOPRESSOR   Take 1 tablet (25 mg total) by mouth 2 (two) times daily.      pantoprazole 40 MG tablet   Commonly known as: PROTONIX   Take 1 tablet (40 mg total) by mouth daily.      prenatal vitamin w/FE, FA 29-1 MG Chew   Chew 1 tablet by mouth daily.         ASK your doctor about these medications         MULTI-VITAMIN GUMMIES Chew   Chew 1 tablet by mouth daily.      oxyCODONE-acetaminophen 5-325 MG per tablet   Commonly known as: PERCOCET   Take 1 tablet by mouth every 8 (eight) hours as needed.           Follow-up  Information    Follow up with [facility] in 1 week.   Contact information:   [address] [place] [address]          Signed: Allie Bossier. 09/28/2011, 2:45 PM

## 2011-09-28 NOTE — Progress Notes (Signed)
RT talked to patient about wearing CPAP at night and while resting. Patient refused saying that it was dangerous to wear CPAP because anything forcing air and water into your body was unnatural. RT informed her that CPAP would help her sleep better and would be better for her and baby if she wore it. Cheyenne Arias shook her head and said she didn't like it and couldn't sleep with it on. RT entered pt room for her 0300 treatment and patient is not wearing CPAP and sleeping. CPAP machine has not been moved since previous patient visit at 2100 on 09/27/11. RT will monitor.

## 2011-09-28 NOTE — Progress Notes (Signed)
Asked to comment on outpt rx  Home meds reviewed:    Should not take any labetalol (lopressor preferred as more beta selective  Prednisone 10 mg take  4 each am x 2 days,   2 each am x 2 days,  1 each am x2days and stop   Protonix 40 mg Take 30- 60 min before your first and last meals of the day   Albuterol 2 pffs every 4 hours  02 if meets OB criteria as nothing on respiratory therapy notes w/in last 24 h to justify 02 by the usual criteria acknowledging that hyperventilation from any cause including anxiety from not having 02 is not healthy for the baby  Sandrea Hughs, MD Pulmonary and Critical Care Medicine [facility] Cell [phone]

## 2011-09-28 NOTE — Progress Notes (Signed)
Patient ID: Cheyenne Arias, female   DOB: 08-11-1975, 36 y.o.   MRN: [id number] FACULTY PRACTICE ANTEPARTUM(COMPREHENSIVE) NOTE  Cheyenne Arias is a 36 y.o. G3P2002 at [redacted]w[redacted]d by best clinical estimate who is admitted for asthma.   Fetal presentation is unsure. Length of Stay:  13  Days  Subjective: Feels better but gets SOB and dizzy off O2. Good FM Patient reports the fetal movement as active. Patient reports uterine contraction  activity as none. Patient reports  vaginal bleeding as none. Patient describes fluid per vagina as None.  Vitals:  Blood pressure 118/57, pulse 113, temperature 98.3 F (36.8 C), temperature source Oral, resp. rate 22, height 5' (1.524 m), weight 268 lb 1.6 oz (121.609 kg), last menstrual period 02/19/2011, SpO2 96.00%. Physical Examination:  General appearance - alert, well appearing, and in no distress Heart - normal rate and regular rhythm Chest - rare wheeze, rales at bases Abdomen - soft, nontender, nondistended Fundal Height:  size equals dates Cervical Exam: Not evaluated.unsure. Extremities: extremities normal, atraumatic, no cyanosis or edema and Homans sign is negative, no sign of DVT with DTRs 2+ bilaterally Membranes:intact  Fetal Monitoring:  Baseline: 150 bpm Difficult to maintain extended monitoring due to maternal habitus   Labs:  No results found for this or any previous visit (from the past 24 hour(s)).  Imaging Studies:     Currently EPIC will not allow sonographic studies to automatically populate into notes.  In the meantime, copy and paste results into note or free text.  Medications:  Scheduled    . amLODipine  10 mg Oral Daily  . budesonide  0.5 mg Nebulization BID  . docusate sodium  100 mg Oral Daily  . fluticasone  2 spray Each Nare BID  . levalbuterol  0.63 mg Nebulization Q6H  . loratadine  10 mg Oral Daily  . magic mouthwash  15 mL Oral TID  . metoprolol tartrate  25 mg Oral BID  . predniSONE  10 mg Oral Q  breakfast  . prenatal vitamin w/FE, FA  1 tablet Oral Daily   I have reviewed the patient's current medications.  ASSESSMENT: Patient Active Problem List  Diagnosis  . Bleeding in early pregnancy  . Fibromyalgia  . Low back pain  . AMA (advanced maternal age) multigravida 35+  . Obesity  . Viral URI  . Pelvic relaxation  . History of urinary retention  . Hx of dysmenorrhea  . Fibroids  . Anemia  . Hypertension complicating pregnancy, childbirth and puerperium, antepartum  . Benign essential HTN  . Supervision of high-risk pregnancy  . ASCUS with positive high risk HPV  . History of cesarean delivery, currently pregnant  . Asthma complicating pregnancy, antepartum  . Acute asthma exacerbation  . Atelectasis  . Acute sinusitis  . Hypoxia  . Peripheral edema  . Weight gain  . OSA (obstructive sleep apnea)  . Hypertension  . Other dyspnea and respiratory abnormality    PLAN: Pulmonary to see and help with discharge planning  Lateesha Bezold 09/28/2011,7:35 AM

## 2011-09-28 NOTE — Progress Notes (Signed)
Patient fully woke up before RT left the room post 0300 treatment and asked to wear CPAP. RT helped pt to put mask on and set up O2 at 1LPM. Sats are 97. RT informed pt to call RN if she decides to take CPAP off in order to turn her 1L [place] back on. RT will monitor.

## 2011-09-28 NOTE — Progress Notes (Signed)
[facility]   SUBJ: Cough so hard vomits and reports sats as low as upper 70s during episode but none today and  Comfortable on on 1lpm without increased wob at rest  OBJ: BP 127/65  Pulse 113  Temp 98.2 F (36.8 C) (Oral)  Resp 20  Ht 5' (1.524 m)  Wt 260 lb 14.4 oz (118.343 kg)  BMI 50.95 kg/m2  SpO2 100%  LMP 02/19/2011    Intake/Output Summary (Last 24 hours) at 09/28/11 1336 Last data filed at 09/28/11 1200  Gross per 24 hour  Intake   1620 ml  Output   2800 ml  Net  -1180 ml   Wt Readings from Last 3 Encounters:  09/28/11 260 lb 14.4 oz (118.343 kg)  09/12/11 266 lb (120.657 kg)  09/10/11 270 lb (122.471 kg)   General - no increased WOB HEENT - no sinus tenderness Cardiac - s1s2 regular, tachycardic Chest - clear with mild pseudowheeze Abd - gravid uterus, non tender Ext - pretibial edema Neuro - A&O x 3  CBC Lab Results  Component Value Date   WBC 19.2* 09/25/2011   HGB 9.5* 09/25/2011   HCT 32.5* 09/25/2011   MCV 67.0* 09/25/2011   PLT 395 09/25/2011    BMET Lab Results  Component Value Date   CREATININE 0.46* 09/25/2011   BUN 4* 09/25/2011   NA 137 09/25/2011   K 3.7 09/25/2011   CL 101 09/25/2011   CO2 26 09/25/2011    BNP    Component Value Date/Time   PROBNP 128.9* 09/20/2011 0520   Lab Results  Component Value Date   TSH 1.596 09/25/2011    Echo 6/06>>mod LVH, EF 65 to 70%, small/mod pericardial effusion Doppler legs 6/07>>no DVT CT chest 6/08>>no PE, Lt base ATX/ASD, borderline cardiomegaly   IMP/PLAN A: Severe dyspnea - multifactorial  Asthma exacerbation vs pseudoashma  Edema  Severe obesity with deconditioning             Atelectasis             Anxiety with VCD  Worse on 6/12 likely from increased edema P: Changed prednisone to 10 mg daily from 6/12, and continue taper to off as tolerated Continue pulmicort, xopenex nebulizer therapy to limit upper airway irritation  lasix 40 mg x one 6/12 given Completed course of  ceftin 6/09 Mobilize as tolerated  A: Hypoxia rec Our usual criteria is for sat <89 x > 5 min but in view of tendency to hyperventilate when not on 02 (which is as bad as hypoxemia for fetal 02 delivery) I would favor d/c on 02 if insurance will cover and in meantime be sure the severity and duration of desats are documented preferably with a separate progress note but whomever is witnessing this.  A: HTN, Sinus tachycardia P: Continue metoprolol, norvasc  A: OSA>>likely needs higher pressure settings while acutely ill P: On auto CPAP range 5 to 15 cm H2O   should use CPAP when taking naps also  A: Leukocytosis likely from acute stress, and steroids. P:  F/u CBC intermittently  A: Anxiety, fibromyalgia.  Likely playing role in her sense of dyspnea. P: Defer to primary team  A: Anemia P: Defer to primary team   Note: Symptoms are markedly disproportionate to objective findings and not clear this is a lung problem but pt does appear to have difficult airway management issues. DDX of  difficult airways managment all start with A and  include Adherence, Ace Inhibitors, Acid Reflux,  Active Sinus Disease, Alpha 1 Antitripsin deficiency, Anxiety masquerading as Airways dz,  ABPA,  allergy(esp in young), Aspiration (esp in elderly), Adverse effects of DPI,  Active smokers, plus two Bs  = Bronchiectasis and Beta blocker use..and one C= CHF  ? Acid reflux > clearly refluxing severely as coughs so hard she vomits, rec trial of protonix 40 mg bid ac if ok with OB service  ? Allergies/ asthma > should be addressed on present regimen  ? chf > doubt by echo though could have a diastolic component   Anxiety clearly an issue but typically a dx of exclusion.  Ok to discharge to f/u with Dr Craige Cotta at [phone] w/in a week of discharge  Cheyenne Hughs, MD Pulmonary and Critical Care Medicine [facility] Cell [phone]

## 2011-10-01 ENCOUNTER — Other Ambulatory Visit: Payer: Self-pay | Admitting: *Deleted

## 2011-10-01 NOTE — Telephone Encounter (Signed)
Received a fax requesting a refill for Levalbuterol  Nebulizer solution

## 2011-10-04 ENCOUNTER — Ambulatory Visit (INDEPENDENT_AMBULATORY_CARE_PROVIDER_SITE_OTHER): Payer: Medicaid Other | Admitting: Obstetrics and Gynecology

## 2011-10-04 ENCOUNTER — Telehealth: Payer: Self-pay | Admitting: General Practice

## 2011-10-04 VITALS — BP 126/74 | Temp 97.9°F | Wt 261.3 lb

## 2011-10-04 DIAGNOSIS — O0990 Supervision of high risk pregnancy, unspecified, unspecified trimester: Secondary | ICD-10-CM

## 2011-10-04 DIAGNOSIS — O139 Gestational [pregnancy-induced] hypertension without significant proteinuria, unspecified trimester: Secondary | ICD-10-CM

## 2011-10-04 DIAGNOSIS — O34219 Maternal care for unspecified type scar from previous cesarean delivery: Secondary | ICD-10-CM

## 2011-10-04 DIAGNOSIS — I10 Essential (primary) hypertension: Secondary | ICD-10-CM

## 2011-10-04 DIAGNOSIS — E669 Obesity, unspecified: Secondary | ICD-10-CM

## 2011-10-04 DIAGNOSIS — J45909 Unspecified asthma, uncomplicated: Secondary | ICD-10-CM

## 2011-10-04 DIAGNOSIS — G4733 Obstructive sleep apnea (adult) (pediatric): Secondary | ICD-10-CM

## 2011-10-04 LAB — POCT URINALYSIS DIP (DEVICE)
Hgb urine dipstick: NEGATIVE
Ketones, ur: NEGATIVE mg/dL
Protein, ur: NEGATIVE mg/dL
Specific Gravity, Urine: 1.02 (ref 1.005–1.030)
Urobilinogen, UA: 0.2 mg/dL (ref 0.0–1.0)

## 2011-10-04 MED ORDER — FLUCONAZOLE 150 MG PO TABS: 150.0000 mg | ORAL_TABLET | Freq: Once | ORAL | Status: AC

## 2011-10-04 MED ORDER — FIRST-MARYS MOUTHWASH MT SUSP: 15.0000 mL | Freq: Three times a day (TID) | OROMUCOSAL | Status: DC

## 2011-10-04 NOTE — Progress Notes (Signed)
U/S scheduled with MFM October 15, 2011 at 8 am.

## 2011-10-04 NOTE — Progress Notes (Signed)
Patient doing well. Breathing status slightly improved since discharged but still requires O2. Patient scheduled to see Pulm on 7/25. Will schedule f/u growth ultrasound for week of 7/1. Will start twice weekly NST. FM/PTL precautions reviewed. Patient is not interested in any form of birth control postpartum NST reviewed and reactive

## 2011-10-04 NOTE — Progress Notes (Signed)
Pulse= 118   Edema trace on legs. C/o RUQ of abdomen. No pressure.

## 2011-10-04 NOTE — Telephone Encounter (Signed)
Patient's pharmacy called stating that "they had received a prescription for first mary's mouthwash, which has tetracycline in it and unfortunately that drug is unavailable right now." Patient's pharmacy wants to know if regular mouthwash can be used instead and to call them back soon

## 2011-10-07 ENCOUNTER — Encounter (HOSPITAL_BASED_OUTPATIENT_CLINIC_OR_DEPARTMENT_OTHER): Payer: Self-pay | Admitting: *Deleted

## 2011-10-07 ENCOUNTER — Emergency Department (HOSPITAL_BASED_OUTPATIENT_CLINIC_OR_DEPARTMENT_OTHER)
Admission: EM | Admit: 2011-10-07 | Discharge: 2011-10-07 | Disposition: A | Discharge status: Home / Self Care | Payer: Medicaid Other | Attending: Emergency Medicine | Admitting: Emergency Medicine

## 2011-10-07 DIAGNOSIS — O99340 Other mental disorders complicating pregnancy, unspecified trimester: Secondary | ICD-10-CM | POA: Insufficient documentation

## 2011-10-07 DIAGNOSIS — F329 Major depressive disorder, single episode, unspecified: Secondary | ICD-10-CM | POA: Insufficient documentation

## 2011-10-07 DIAGNOSIS — J45909 Unspecified asthma, uncomplicated: Secondary | ICD-10-CM | POA: Insufficient documentation

## 2011-10-07 DIAGNOSIS — O169 Unspecified maternal hypertension, unspecified trimester: Secondary | ICD-10-CM | POA: Insufficient documentation

## 2011-10-07 DIAGNOSIS — L209 Atopic dermatitis, unspecified: Secondary | ICD-10-CM

## 2011-10-07 DIAGNOSIS — F3289 Other specified depressive episodes: Secondary | ICD-10-CM | POA: Insufficient documentation

## 2011-10-07 DIAGNOSIS — F411 Generalized anxiety disorder: Secondary | ICD-10-CM | POA: Insufficient documentation

## 2011-10-07 DIAGNOSIS — O9989 Other specified diseases and conditions complicating pregnancy, childbirth and the puerperium: Secondary | ICD-10-CM | POA: Insufficient documentation

## 2011-10-07 DIAGNOSIS — L2089 Other atopic dermatitis: Secondary | ICD-10-CM | POA: Insufficient documentation

## 2011-10-07 MED ORDER — PREDNISONE 50 MG PO TABS
60.0000 mg | ORAL_TABLET | Freq: Once | ORAL | Status: AC
  Administered 2011-10-07: 60 mg via ORAL
  Filled 2011-10-07: qty 1

## 2011-10-07 MED ORDER — PREDNISONE 10 MG PO TABS: 20.0000 mg | ORAL_TABLET | Freq: Every day | ORAL | Status: DC

## 2011-10-07 NOTE — ED Notes (Addendum)
Patient has a rash on her chest and her back. States that it itches. Patient recently released from [facility] with asthma and "partially collapsed lung". Is using home O2 and is [redacted] weeks pregnant.

## 2011-10-07 NOTE — ED Provider Notes (Signed)
History   This chart was scribed for Rolan Bucco, MD by Charolett Bumpers . The patient was seen in room MH05/MH05.    CSN: [id number]  Arrival date & time 10/07/11  1919   First MD Initiated Contact with Patient 10/07/11 2052      Chief Complaint  Patient presents with  . Rash    (Consider location/radiation/quality/duration/timing/severity/associated sxs/prior treatment) HPI Cheyenne Arias is a 36 y.o. female who presents to the Emergency Department complaining of constant, moderate rash for the past 6 days. Patient reports associated itching. Patient states that the rash is located on her chest and back. Patient states that the rash started last Tuesday, while in hospital with asthma. Patient states that she was d/c last Friday. Patient states that she has tried cortisone cream with no relief. Patient denies any fevers, temperature here in ED is 99. Patient states that her symptoms are worsening. Patient states that she is not on any abx currently. Patient is on O2 at home and states that she is [redacted] weeks pregnant. Patient reports that she can feel fetal movement normally.  No cramping/bleeding or discharge.  No facial swelling.  No swelling of lips or tongue.  Has hx of very sensitive skin with frequent allergic reactions  Past Medical History  Diagnosis Date  . Hypertension   . Migraine   . Anxiety   . Abnormal Pap smear   . Anemia   . Fibroids   . Fibromyalgia     degenerative disc disease  . Depression   . Sleep apnea   . Asthma     Past Surgical History  Procedure Date  . Wisdom tooth extraction   . Mandible surgery   . Carpal tunnel release   . Cesarean section     1994, 1996     Family History  Problem Relation Age of Onset  . Sickle cell trait Maternal Aunt   . Sickle cell anemia Other     History  Substance Use Topics  . Smoking status: Never Smoker   . Smokeless tobacco: Never Used  . Alcohol Use: No    OB History    Grav Para Term  Preterm Abortions TAB SAB Ect Mult Living   3 2 2       2       Review of Systems  Constitutional: Negative for fever.  Respiratory: Positive for cough. Negative for shortness of breath.   Cardiovascular: Negative for leg swelling.  Gastrointestinal: Negative for nausea and vomiting.  Genitourinary: Negative for vaginal bleeding and vaginal discharge.  Skin: Positive for rash.  All other systems reviewed and are negative.    Allergies  Latex and Morphine and related  Home Medications   Current Outpatient Rx  Name Route Sig Dispense Refill  . ALBUTEROL SULFATE HFA 108 (90 BASE) MCG/ACT IN AERS Inhalation Inhale 2 puffs into the lungs every 6 (six) hours as needed. As needed for asthma/shortness of breath    . ALBUTEROL SULFATE (5 MG/ML) 0.5% IN NEBU Nebulization Take 2.5 mg by nebulization every 6 (six) hours as needed.    [name] AMLODIPINE BESYLATE 10 MG PO TABS Oral Take 1 tablet (10 mg total) by mouth daily. 31 tablet 6  . BUDESONIDE 0.5 MG/2ML IN SUSP Nebulization Take 2 mLs (0.5 mg total) by nebulization 2 (two) times daily. 60 mL 3  . CYCLOBENZAPRINE HCL 10 MG PO TABS Oral Take 10 mg by mouth 3 (three) times daily as needed.    [name] FLUTICASONE  PROPIONATE 50 MCG/ACT NA SUSP Nasal Place 2 sprays into the nose 2 (two) times daily. 16 g 12  . LORATADINE 10 MG PO TABS Oral Take 1 tablet (10 mg total) by mouth daily. 31 tablet 12  . METOPROLOL TARTRATE 25 MG PO TABS Oral Take 1 tablet (25 mg total) by mouth 2 (two) times daily. 360 tablet 6  . OXYCODONE-ACETAMINOPHEN 5-325 MG PO TABS Oral Take 3 tablets by mouth every 8 (eight) hours as needed.    [name] PANTOPRAZOLE SODIUM 40 MG PO TBEC Oral Take 1 tablet (40 mg total) by mouth daily. 31 tablet 6  . COMPLETENATE 29-1 MG PO CHEW Oral Chew 1 tablet by mouth daily. 60 tablet 3  . FIRST-MARYS MOUTHWASH MT SUSP Mouth/Throat Use as directed 15 mLs in the mouth or throat 3 (three) times daily. 1 Bottle 0  . DIPHENHYDRAMINE-APAP (SLEEP) 25-500 MG PO  TABS Oral Take 1 tablet by mouth at bedtime as needed. Takes for sleep    . LEVALBUTEROL HCL 0.63 MG/3ML IN NEBU Nebulization Take 3 mLs (0.63 mg total) by nebulization every 6 (six) hours. 3 mL 5  . PREDNISONE 10 MG PO TABS Oral Take 2 tablets (20 mg total) by mouth daily. 10 tablet 0    BP 123/75  Pulse 117  Temp 99 F (37.2 C) (Oral)  Resp 20  Wt 261 lb (118.389 kg)  SpO2 99%  LMP 02/19/2011  Physical Exam  Nursing note and vitals reviewed. Constitutional: She is oriented to person, place, and time. She appears well-developed and well-nourished. No distress.  HENT:  Head: Normocephalic and atraumatic.  Mouth/Throat: Oropharynx is clear and moist.  Eyes: EOM are normal. Pupils are equal, round, and reactive to light.  Neck: Normal range of motion. Neck supple. No tracheal deviation present.  Cardiovascular: Normal rate, regular rhythm and normal heart sounds.   Pulmonary/Chest: Effort normal and breath sounds normal. No respiratory distress. She has no wheezes.  Abdominal: Soft. She exhibits no distension.  Musculoskeletal: Normal range of motion. She exhibits no edema.  Neurological: She is alert and oriented to person, place, and time. No sensory deficit.  Skin: Skin is warm and dry. Rash noted. No petechiae and no purpura noted. Rash is papular.       Small, darkened, skin color papular rash across back and chest. No blisters. No petechiae. No purpura. Fine papular rash on face. No rash on hands/feet.  No oral lesions  Psychiatric: She has a normal mood and affect. Her behavior is normal.    ED Course  Procedures (including critical care time)  DIAGNOSTIC STUDIES: Oxygen Saturation is 99% on room air, normal by my interpretation.    COORDINATION OF CARE:  2130: Discussed planned course of treatment with the patient, who is agreeable at this time.     Labs Reviewed - No data to display No results found.   1. Atopic dermatitis       MDM  Feel that this is  likely allergic.  Does not appear to be infection.  Could be related to pregnancy.  Does not look like scabies.  Will try course of steroids.  F/u with ob/gyn  I personally performed the services described in this documentation, which was scribed in my presence.  The recorded information has been reviewed and considered.        Rolan Bucco, MD 10/07/11 2154

## 2011-10-07 NOTE — Discharge Instructions (Signed)
Allergies, Generic Allergies may happen from anything your body is sensitive to. This may be food, medicines, pollens, chemicals, and nearly anything around you in everyday life that produces allergens. An allergen is anything that causes an allergy producing substance. Heredity is often a factor in causing these problems. This means you may have some of the same allergies as your parents. Food allergies happen in all age groups. Food allergies are some of the most severe and life threatening. Some common food allergies are cow's milk, seafood, eggs, nuts, wheat, and soybeans. SYMPTOMS   Swelling around the mouth.   An itchy red rash or hives.   Vomiting or diarrhea.   Difficulty breathing.  SEVERE ALLERGIC REACTIONS ARE LIFE-THREATENING. This reaction is called anaphylaxis. It can cause the mouth and throat to swell and cause difficulty with breathing and swallowing. In severe reactions only a trace amount of food (for example, peanut oil in a salad) may cause death within seconds. Seasonal allergies occur in all age groups. These are seasonal because they usually occur during the same season every year. They may be a reaction to molds, grass pollens, or tree pollens. Other causes of problems are house dust mite allergens, pet dander, and mold spores. The symptoms often consist of nasal congestion, a runny itchy nose associated with sneezing, and tearing itchy eyes. There is often an associated itching of the mouth and ears. The problems happen when you come in contact with pollens and other allergens. Allergens are the particles in the air that the body reacts to with an allergic reaction. This causes you to release allergic antibodies. Through a chain of events, these eventually cause you to release histamine into the blood stream. Although it is meant to be protective to the body, it is this release that causes your discomfort. This is why you were given anti-histamines to feel better. If you are  unable to pinpoint the offending allergen, it may be determined by skin or blood testing. Allergies cannot be cured but can be controlled with medicine. Hay fever is a collection of all or some of the seasonal allergy problems. It may often be treated with simple over-the-counter medicine such as diphenhydramine. Take medicine as directed. Do not drink alcohol or drive while taking this medicine. Check with your caregiver or package insert for child dosages. If these medicines are not effective, there are many new medicines your caregiver can prescribe. Stronger medicine such as nasal spray, eye drops, and corticosteroids may be used if the first things you try do not work well. Other treatments such as immunotherapy or desensitizing injections can be used if all else fails. Follow up with your caregiver if problems continue. These seasonal allergies are usually not life threatening. They are generally more of a nuisance that can often be handled using medicine. HOME CARE INSTRUCTIONS   If unsure what causes a reaction, keep a diary of foods eaten and symptoms that follow. Avoid foods that cause reactions.   If hives or rash are present:   Take medicine as directed.   You may use an over-the-counter antihistamine (diphenhydramine) for hives and itching as needed.   Apply cold compresses (cloths) to the skin or take baths in cool water. Avoid hot baths or showers. Heat will make a rash and itching worse.   If you are severely allergic:   Following a treatment for a severe reaction, hospitalization is often required for closer follow-up.   Wear a medic-alert bracelet or necklace stating the allergy.     You and your family must learn how to give adrenaline or use an anaphylaxis kit.   If you have had a severe reaction, always carry your anaphylaxis kit or EpiPen with you. Use this medicine as directed by your caregiver if a severe reaction is occurring. Failure to do so could have a fatal  outcome.  SEEK MEDICAL CARE IF:  You suspect a food allergy. Symptoms generally happen within 30 minutes of eating a food.   Your symptoms have not gone away within 2 days or are getting worse.   You develop new symptoms.   You want to retest yourself or your child with a food or drink you think causes an allergic reaction. Never do this if an anaphylactic reaction to that food or drink has happened before. Only do this under the care of a caregiver.  SEEK IMMEDIATE MEDICAL CARE IF:   You have difficulty breathing, are wheezing, or have a tight feeling in your chest or throat.   You have a swollen mouth, or you have hives, swelling, or itching all over your body.   You have had a severe reaction that has responded to your anaphylaxis kit or an EpiPen. These reactions may return when the medicine has worn off. These reactions should be considered life threatening.  MAKE SURE YOU:   Understand these instructions.   Will watch your condition.   Will get help right away if you are not doing well or get worse.  Document Released: 06/26/2002 Document Revised: 03/22/2011 Document Reviewed: 12/01/2007 [employer] Patient Information 2012 [employer]. 

## 2011-10-08 ENCOUNTER — Encounter (HOSPITAL_COMMUNITY): Payer: Self-pay

## 2011-10-08 ENCOUNTER — Encounter: Payer: Self-pay | Admitting: *Deleted

## 2011-10-08 ENCOUNTER — Ambulatory Visit (HOSPITAL_COMMUNITY)
Admission: RE | Admit: 2011-10-08 | Discharge: 2011-10-08 | Disposition: A | Discharge status: Home / Self Care | Payer: Medicaid Other | Source: Ambulatory Visit | Attending: Obstetrics and Gynecology | Admitting: Obstetrics and Gynecology

## 2011-10-08 ENCOUNTER — Ambulatory Visit (HOSPITAL_COMMUNITY)
Admission: RE | Admit: 2011-10-08 | Discharge: 2011-10-08 | Disposition: A | Discharge status: Home / Self Care | Payer: Medicaid Other | Source: Ambulatory Visit | Attending: Family | Admitting: Family

## 2011-10-08 ENCOUNTER — Other Ambulatory Visit: Payer: Self-pay | Admitting: Obstetrics and Gynecology

## 2011-10-08 VITALS — BP 134/66 | HR 118 | Wt 265.0 lb

## 2011-10-08 DIAGNOSIS — J45901 Unspecified asthma with (acute) exacerbation: Secondary | ICD-10-CM

## 2011-10-08 DIAGNOSIS — J45909 Unspecified asthma, uncomplicated: Secondary | ICD-10-CM

## 2011-10-08 DIAGNOSIS — O0990 Supervision of high risk pregnancy, unspecified, unspecified trimester: Secondary | ICD-10-CM

## 2011-10-08 DIAGNOSIS — Z3689 Encounter for other specified antenatal screening: Secondary | ICD-10-CM | POA: Insufficient documentation

## 2011-10-08 DIAGNOSIS — R0989 Other specified symptoms and signs involving the circulatory and respiratory systems: Secondary | ICD-10-CM

## 2011-10-08 DIAGNOSIS — E669 Obesity, unspecified: Secondary | ICD-10-CM

## 2011-10-08 DIAGNOSIS — I10 Essential (primary) hypertension: Secondary | ICD-10-CM

## 2011-10-08 DIAGNOSIS — O34219 Maternal care for unspecified type scar from previous cesarean delivery: Secondary | ICD-10-CM | POA: Insufficient documentation

## 2011-10-08 DIAGNOSIS — O10019 Pre-existing essential hypertension complicating pregnancy, unspecified trimester: Secondary | ICD-10-CM | POA: Insufficient documentation

## 2011-10-08 DIAGNOSIS — O09529 Supervision of elderly multigravida, unspecified trimester: Secondary | ICD-10-CM | POA: Insufficient documentation

## 2011-10-09 ENCOUNTER — Encounter: Payer: Self-pay | Admitting: Adult Health

## 2011-10-09 ENCOUNTER — Ambulatory Visit (INDEPENDENT_AMBULATORY_CARE_PROVIDER_SITE_OTHER): Payer: Medicaid Other | Admitting: Adult Health

## 2011-10-09 VITALS — BP 100/54 | HR 113 | Temp 97.9°F | Ht 62.0 in | Wt 266.0 lb

## 2011-10-09 DIAGNOSIS — G4733 Obstructive sleep apnea (adult) (pediatric): Secondary | ICD-10-CM

## 2011-10-09 DIAGNOSIS — J45909 Unspecified asthma, uncomplicated: Secondary | ICD-10-CM

## 2011-10-09 DIAGNOSIS — O99891 Other specified diseases and conditions complicating pregnancy: Secondary | ICD-10-CM

## 2011-10-09 DIAGNOSIS — O99519 Diseases of the respiratory system complicating pregnancy, unspecified trimester: Secondary | ICD-10-CM

## 2011-10-09 NOTE — Progress Notes (Signed)
  Subjective:    Patient ID: Cheyenne Arias, female    DOB: 05/10/1975, 36 y.o.   MRN: [id number]  HPI 36 yo female seen for initial pulmonary consult during hospitalization 09/19/11 for asthma flare during pregnancy . Hx of OSA.   10/09/2011 Post Hospital Admitted 6/1-6/14 /13 for asthma exacerbation during pregnancy-c/b sinusitis, OSA Tx w aggressive pulmonary hygiene , abx and steroids along with CPAP.  EDD - 11/19/11-scheduled C Section .  Since discharge feels better but still gets winded easily.  Some wheezing on /off.  Wears O2 continuously.  Wearing CPAP every night.  No fever or hemoptysis .  Does have some congestion-clear.  Finished prednisone .  Developed an acne like rash along chest , back and upper arms , went to ER , rx short steroid taper on 6/23.     Review of Systems Constitutional:   No  weight loss, night sweats,  Fevers, chills,  +fatigue, or  lassitude.  HEENT:   No headaches,  Difficulty swallowing,  Tooth/dental problems, or  Sore throat,                No sneezing, itching, ear ache,  +nasal congestion, post nasal drip,   CV:  No chest pain,  Orthopnea, PND, swelling in lower extremities, anasarca, dizziness, palpitations, syncope.   GI  No heartburn, indigestion, abdominal pain, nausea, vomiting, diarrhea, change in bowel habits, loss of appetite, bloody stools.   Resp:   No coughing up of blood.     No chest wall deformity  Skin: no rash or lesions.  GU: no dysuria, change in color of urine, no urgency or frequency.  No flank pain, no hematuria   MS:  No joint pain or swelling.  No decreased range of motion.  No back pain.  Psych:  No change in mood or affect. No depression or anxiety.  No memory loss.     '    Objective:   Physical Exam GEN: A/Ox3; pleasant , NAD  HEENT:  [place]/AT,  EACs-clear, TMs-wnl, NOSE-clear, THROAT-clear, no lesions, no postnasal drip or exudate noted.   NECK:  Supple w/ fair ROM; no JVD; normal carotid impulses w/o  bruits; no thyromegaly or nodules palpated; no lymphadenopathy.  RESP  Coarse BS w/o, wheezes/ rales/ or rhonchi.no accessory muscle use, no dullness to percussion  CARD:  RRR, no m/r/g  , no peripheral edema, pulses intact, no cyanosis or clubbing.  GI:   Soft & nt; nml bowel sounds; no organomegaly or masses detected.--c/w pregnancy   Musco: Warm bil, no deformities or joint swelling noted.   Neuro: alert, no focal deficits noted.    Skin: Warm, no lesions or rashes         Assessment & Plan:

## 2011-10-09 NOTE — Assessment & Plan Note (Signed)
Cont on O2 and CPAP

## 2011-10-09 NOTE — Patient Instructions (Addendum)
Continue on current regimen.  follow up Dr. Craige Cotta  In 3-4 weeks and As needed   Continue on CPAP At bedtime

## 2011-10-09 NOTE — Assessment & Plan Note (Signed)
Flare - now improved  Cont on pulmicort neb Twice daily   follow up in 4 weeks with Dr. Craige Cotta

## 2011-10-11 ENCOUNTER — Encounter: Payer: Medicaid Other | Admitting: Family

## 2011-10-11 ENCOUNTER — Ambulatory Visit (INDEPENDENT_AMBULATORY_CARE_PROVIDER_SITE_OTHER): Payer: Medicaid Other | Admitting: Family Medicine

## 2011-10-11 VITALS — BP 109/70 | Temp 97.1°F | Wt 266.6 lb

## 2011-10-11 DIAGNOSIS — O99891 Other specified diseases and conditions complicating pregnancy: Secondary | ICD-10-CM

## 2011-10-11 DIAGNOSIS — J45909 Unspecified asthma, uncomplicated: Secondary | ICD-10-CM

## 2011-10-11 DIAGNOSIS — O0990 Supervision of high risk pregnancy, unspecified, unspecified trimester: Secondary | ICD-10-CM

## 2011-10-11 DIAGNOSIS — O139 Gestational [pregnancy-induced] hypertension without significant proteinuria, unspecified trimester: Secondary | ICD-10-CM

## 2011-10-11 LAB — POCT URINALYSIS DIP (DEVICE)
Hgb urine dipstick: NEGATIVE
Nitrite: NEGATIVE
Specific Gravity, Urine: 1.015 (ref 1.005–1.030)
Urobilinogen, UA: 0.2 mg/dL (ref 0.0–1.0)
pH: 7 (ref 5.0–8.0)

## 2011-10-11 NOTE — Progress Notes (Signed)
Pulse 104 Patient reports some pelvic pain/pressure; patient reports some RUQ pain and occasional dizziness

## 2011-10-11 NOTE — Patient Instructions (Signed)
Pregnancy - Third Trimester The third trimester of pregnancy (the last 3 months) is a period of the most rapid growth for you and your baby. The baby approaches a length of 20 inches and a weight of 6 to 10 pounds. The baby is adding on fat and getting ready for life outside your body. While inside, babies have periods of sleeping and waking, suck their thumbs, and hiccups. You can often feel small contractions of the uterus. This is false labor. It is also called Braxton-Hicks contractions. This is like a practice for labor. The usual problems in this stage of pregnancy include more difficulty breathing, swelling of the hands and feet from water retention, and having to urinate more often because of the uterus and baby pressing on your bladder.  PRENATAL EXAMS  Blood work may continue to be done during prenatal exams. These tests are done to check on your health and the probable health of your baby. Blood work is used to follow your blood levels (hemoglobin). Anemia (low hemoglobin) is common during pregnancy. Iron and vitamins are given to help prevent this. You may also continue to be checked for diabetes. Some of the past blood tests may be done again.   The size of the uterus is measured during each visit. This makes sure your baby is growing properly according to your pregnancy dates.   Your blood pressure is checked every prenatal visit. This is to make sure you are not getting toxemia.   Your urine is checked every prenatal visit for infection, diabetes and protein.   Your weight is checked at each visit. This is done to make sure gains are happening at the suggested rate and that you and your baby are growing normally.   Sometimes, an ultrasound is performed to confirm the position and the proper growth and development of the baby. This is a test done that bounces harmless sound waves off the baby so your caregiver can more accurately determine due dates.   Discuss the type of pain  medication and anesthesia you will have during your labor and delivery.   Discuss the possibility and anesthesia if a Cesarean Section might be necessary.   Inform your caregiver if there is any mental or physical violence at home.  Sometimes, a specialized non-stress test, contraction stress test and biophysical profile are done to make sure the baby is not having a problem. Checking the amniotic fluid surrounding the baby is called an amniocentesis. The amniotic fluid is removed by sticking a needle into the belly (abdomen). This is sometimes done near the end of pregnancy if an early delivery is required. In this case, it is done to help make sure the baby's lungs are mature enough for the baby to live outside of the womb. If the lungs are not mature and it is unsafe to deliver the baby, an injection of cortisone medication is given to the mother 1 to 2 days before the delivery. This helps the baby's lungs mature and makes it safer to deliver the baby. CHANGES OCCURING IN THE THIRD TRIMESTER OF PREGNANCY Your body goes through many changes during pregnancy. They vary from person to person. Talk to your caregiver about changes you notice and are concerned about.  During the last trimester, you have probably had an increase in your appetite. It is normal to have cravings for certain foods. This varies from person to person and pregnancy to pregnancy.   You may begin to get stretch marks on your hips,   abdomen, and breasts. These are normal changes in the body during pregnancy. There are no exercises or medications to take which prevent this change.   Constipation may be treated with a stool softener or adding bulk to your diet. Drinking lots of fluids, fiber in vegetables, fruits, and whole grains are helpful.   Exercising is also helpful. If you have been very active up until your pregnancy, most of these activities can be continued during your pregnancy. If you have been less active, it is helpful  to start an exercise program such as walking. Consult your caregiver before starting exercise programs.   Avoid all smoking, alcohol, un-prescribed drugs, herbs and "street drugs" during your pregnancy. These chemicals affect the formation and growth of the baby. Avoid chemicals throughout the pregnancy to ensure the delivery of a healthy infant.   Backache, varicose veins and hemorrhoids may develop or get worse.   You will tire more easily in the third trimester, which is normal.   The baby's movements may be stronger and more often.   You may become short of breath easily.   Your belly button may stick out.   A yellow discharge may leak from your breasts called colostrum.   You may have a bloody mucus discharge. This usually occurs a few days to a week before labor begins.  HOME CARE INSTRUCTIONS   Keep your caregiver's appointments. Follow your caregiver's instructions regarding medication use, exercise, and diet.   During pregnancy, you are providing food for you and your baby. Continue to eat regular, well-balanced meals. Choose foods such as meat, fish, milk and other low fat dairy products, vegetables, fruits, and whole-grain breads and cereals. Your caregiver will tell you of the ideal weight gain.   A physical sexual relationship may be continued throughout pregnancy if there are no other problems such as early (premature) leaking of amniotic fluid from the membranes, vaginal bleeding, or belly (abdominal) pain.   Exercise regularly if there are no restrictions. Check with your caregiver if you are unsure of the safety of your exercises. Greater weight gain will occur in the last 2 trimesters of pregnancy. Exercising helps:   Control your weight.   Get you in shape for labor and delivery.   You lose weight after you deliver.   Rest a lot with legs elevated, or as needed for leg cramps or low back pain.   Wear a good support or jogging bra for breast tenderness during  pregnancy. This may help if worn during sleep. Pads or tissues may be used in the bra if you are leaking colostrum.   Do not use hot tubs, steam rooms, or saunas.   Wear your seat belt when driving. This protects you and your baby if you are in an accident.   Avoid raw meat, cat litter boxes and soil used by cats. These carry germs that can cause birth defects in the baby.   It is easier to loose urine during pregnancy. Tightening up and strengthening the pelvic muscles will help with this problem. You can practice stopping your urination while you are going to the bathroom. These are the same muscles you need to strengthen. It is also the muscles you would use if you were trying to stop from passing gas. You can practice tightening these muscles up 10 times a set and repeating this about 3 times per day. Once you know what muscles to tighten up, do not perform these exercises during urination. It is more likely   to cause an infection by backing up the urine.   Ask for help if you have financial, counseling or nutritional needs during pregnancy. Your caregiver will be able to offer counseling for these needs as well as refer you for other special needs.   Make a list of emergency phone numbers and have them available.   Plan on getting help from family or friends when you go home from the hospital.   Make a trial run to the hospital.   Take prenatal classes with the father to understand, practice and ask questions about the labor and delivery.   Prepare the baby's room/nursery.   Do not travel out of the city unless it is absolutely necessary and with the advice of your caregiver.   Wear only low or no heal shoes to have better balance and prevent falling.  MEDICATIONS AND DRUG USE IN PREGNANCY  Take prenatal vitamins as directed. The vitamin should contain 1 milligram of folic acid. Keep all vitamins out of reach of children. Only a couple vitamins or tablets containing iron may be fatal  to a baby or young child when ingested.   Avoid use of all medications, including herbs, over-the-counter medications, not prescribed or suggested by your caregiver. Only take over-the-counter or prescription medicines for pain, discomfort, or fever as directed by your caregiver. Do not use aspirin, ibuprofen (Motrin, Advil, Nuprin) or naproxen (Aleve) unless OK'd by your caregiver.   Let your caregiver also know about herbs you may be using.   Alcohol is related to a number of birth defects. This includes fetal alcohol syndrome. All alcohol, in any form, should be avoided completely. Smoking will cause low birth rate and premature babies.   Street/illegal drugs are very harmful to the baby. They are absolutely forbidden. A baby born to an addicted mother will be addicted at birth. The baby will go through the same withdrawal an adult does.  SEEK MEDICAL CARE IF: You have any concerns or worries during your pregnancy. It is better to call with your questions if you feel they cannot wait, rather than worry about them. DECISIONS ABOUT CIRCUMCISION You may or may not know the sex of your baby. If you know your baby is a boy, it may be time to think about circumcision. Circumcision is the removal of the foreskin of the penis. This is the skin that covers the sensitive end of the penis. There is no proven medical need for this. Often this decision is made on what is popular at the time or based upon religious beliefs and social issues. You can discuss these issues with your caregiver or pediatrician. SEEK IMMEDIATE MEDICAL CARE IF:   An unexplained oral temperature above 102 F (38.9 C) develops, or as your caregiver suggests.   You have leaking of fluid from the vagina (birth canal). If leaking membranes are suspected, take your temperature and tell your caregiver of this when you call.   There is vaginal spotting, bleeding or passing clots. Tell your caregiver of the amount and how many pads are  used.   You develop a bad smelling vaginal discharge with a change in the color from clear to white.   You develop vomiting that lasts more than 24 hours.   You develop chills or fever.   You develop shortness of breath.   You develop burning on urination.   You loose more than 2 pounds of weight or gain more than 2 pounds of weight or as suggested by your   caregiver.   You notice sudden swelling of your face, hands, and feet or legs.   You develop belly (abdominal) pain. Round ligament discomfort is a common non-cancerous (benign) cause of abdominal pain in pregnancy. Your caregiver still must evaluate you.   You develop a severe headache that does not go away.   You develop visual problems, blurred or double vision.   If you have not felt your baby move for more than 1 hour. If you think the baby is not moving as much as usual, eat something with sugar in it and lie down on your left side for an hour. The baby should move at least 4 to 5 times per hour. Call right away if your baby moves less than that.   You fall, are in a car accident or any kind of trauma.   There is mental or physical violence at home.  Document Released: 03/27/2001 Document Revised: 03/22/2011 Document Reviewed: 09/29/2008 [employer] Patient Information 2012 [employer]. 

## 2011-10-11 NOTE — Progress Notes (Signed)
NST reviewed and reactive. Saw pulmonary this week. Continues to require O2. Colpo pp

## 2011-10-11 NOTE — Progress Notes (Signed)
Reviewed and agree with assessment/plan. 

## 2011-10-15 ENCOUNTER — Ambulatory Visit (HOSPITAL_COMMUNITY)
Admission: RE | Admit: 2011-10-15 | Discharge: 2011-10-15 | Disposition: A | Discharge status: Home / Self Care | Payer: Medicaid Other | Source: Ambulatory Visit | Attending: Family | Admitting: Family

## 2011-10-15 VITALS — BP 108/63 | HR 104 | Wt 272.0 lb

## 2011-10-15 DIAGNOSIS — O34219 Maternal care for unspecified type scar from previous cesarean delivery: Secondary | ICD-10-CM

## 2011-10-15 DIAGNOSIS — O0990 Supervision of high risk pregnancy, unspecified, unspecified trimester: Secondary | ICD-10-CM

## 2011-10-15 DIAGNOSIS — R0989 Other specified symptoms and signs involving the circulatory and respiratory systems: Secondary | ICD-10-CM

## 2011-10-15 DIAGNOSIS — G4733 Obstructive sleep apnea (adult) (pediatric): Secondary | ICD-10-CM

## 2011-10-15 DIAGNOSIS — O10019 Pre-existing essential hypertension complicating pregnancy, unspecified trimester: Secondary | ICD-10-CM | POA: Insufficient documentation

## 2011-10-15 DIAGNOSIS — E669 Obesity, unspecified: Secondary | ICD-10-CM

## 2011-10-15 DIAGNOSIS — IMO0001 Reserved for inherently not codable concepts without codable children: Secondary | ICD-10-CM

## 2011-10-15 DIAGNOSIS — O99519 Diseases of the respiratory system complicating pregnancy, unspecified trimester: Secondary | ICD-10-CM

## 2011-10-15 DIAGNOSIS — J45901 Unspecified asthma with (acute) exacerbation: Secondary | ICD-10-CM

## 2011-10-15 DIAGNOSIS — O3410 Maternal care for benign tumor of corpus uteri, unspecified trimester: Secondary | ICD-10-CM | POA: Insufficient documentation

## 2011-10-15 DIAGNOSIS — O09529 Supervision of elderly multigravida, unspecified trimester: Secondary | ICD-10-CM | POA: Insufficient documentation

## 2011-10-15 DIAGNOSIS — J45909 Unspecified asthma, uncomplicated: Secondary | ICD-10-CM

## 2011-10-15 NOTE — Progress Notes (Signed)
Patient seen today  for follow up ultrasound.  See full report in [facility].  Alpha Gula, MD  Single IUP at 34 0/7 weeks Interval growth is appropriate (35th %tile) Shortened femurs/ humerus - likely constitutional Normal amniotic fluid volume The fetus is active with a BPP of 8/8  Continue 2x weekly testing - to come to [facility] for weekly Dopplers/ BPP and NSTs in clinic once weekly.

## 2011-10-16 ENCOUNTER — Other Ambulatory Visit: Payer: Self-pay | Admitting: Obstetrics & Gynecology

## 2011-10-17 ENCOUNTER — Telehealth: Payer: Self-pay | Admitting: *Deleted

## 2011-10-17 ENCOUNTER — Encounter: Payer: Self-pay | Admitting: Family Medicine

## 2011-10-17 ENCOUNTER — Ambulatory Visit (INDEPENDENT_AMBULATORY_CARE_PROVIDER_SITE_OTHER): Payer: Medicaid Other | Admitting: Family Medicine

## 2011-10-17 DIAGNOSIS — O139 Gestational [pregnancy-induced] hypertension without significant proteinuria, unspecified trimester: Secondary | ICD-10-CM

## 2011-10-17 LAB — POCT URINALYSIS DIP (DEVICE)
Bilirubin Urine: NEGATIVE
Hgb urine dipstick: NEGATIVE
Ketones, ur: NEGATIVE mg/dL
Leukocytes, UA: NEGATIVE
Nitrite: NEGATIVE
Protein, ur: NEGATIVE mg/dL
pH: 7 (ref 5.0–8.0)

## 2011-10-17 MED ORDER — FUROSEMIDE 40 MG PO TABS: 40.0000 mg | ORAL_TABLET | Freq: Every day | ORAL | Status: DC

## 2011-10-17 NOTE — Patient Instructions (Signed)
Breastfeeding BENEFITS OF BREASTFEEDING For the baby  The first milk (colostrum) helps the baby's digestive system function better.   There are antibodies from the mother in the milk that help the baby fight off infections.   The baby has a lower incidence of asthma, allergies, and SIDS (sudden infant death syndrome).   The nutrients in breast milk are better than formulas for the baby and helps the baby's brain grow better.   Babies who breastfeed have less gas, colic, and constipation.  For the mother  Breastfeeding helps develop a very special bond between mother and baby.   It is more convenient, always available at the correct temperature and cheaper than formula feeding.   It burns calories in the mother and helps with losing weight that was gained during pregnancy.   It makes the uterus contract back down to normal size faster and slows bleeding following delivery.   Breastfeeding mothers have a lower risk of developing breast cancer.  NURSE FREQUENTLY  A healthy, full-term baby may breastfeed as often as every hour or space his or her feedings to every 3 hours.   How often to nurse will vary from baby to baby. Watch your baby for signs of hunger, not the clock.   Nurse as often as the baby requests, or when you feel the need to reduce the fullness of your breasts.   Awaken the baby if it has been 3 to 4 hours since the last feeding.   Frequent feeding will help the mother make more milk and will prevent problems like sore nipples and engorgement of the breasts.  BABY'S POSITION AT THE BREAST  Whether lying down or sitting, be sure that the baby's tummy is facing your tummy.   Support the breast with 4 fingers underneath the breast and the thumb above. Make sure your fingers are well away from the nipple and baby's mouth.   Stroke the baby's lips and cheek closest to the breast gently with your finger or nipple.   When the baby's mouth is open wide enough, place all  of your nipple and as much of the dark area around the nipple as possible into your baby's mouth.   Pull the baby in close so the tip of the nose and the baby's cheeks touch the breast during the feeding.  FEEDINGS  The length of each feeding varies from baby to baby and from feeding to feeding.   The baby must suck about 2 to 3 minutes for your milk to get to him or her. This is called a "let down." For this reason, allow the baby to feed on each breast as long as he or she wants. Your baby will end the feeding when he or she has received the right balance of nutrients.   To break the suction, put your finger into the corner of the baby's mouth and slide it between his or her gums before removing your breast from his or her mouth. This will help prevent sore nipples.  REDUCING BREAST ENGORGEMENT  In the first week after your baby is born, you may experience signs of breast engorgement. When breasts are engorged, they feel heavy, warm, full, and may be tender to the touch. You can reduce engorgement if you:   Nurse frequently, every 2 to 3 hours. Mothers who breastfeed early and often have fewer problems with engorgement.   Place light ice packs on your breasts between feedings. This reduces swelling. Wrap the ice packs in a   lightweight towel to protect your skin.   Apply moist hot packs to your breast for 5 to 10 minutes before each feeding. This increases circulation and helps the milk flow.   Gently massage your breast before and during the feeding.   Make sure that the baby empties at least one breast at every feeding before switching sides.   Use a breast pump to empty the breasts if your baby is sleepy or not nursing well. You may also want to pump if you are returning to work or or you feel you are getting engorged.   Avoid bottle feeds, pacifiers or supplemental feedings of water or juice in place of breastfeeding.   Be sure the baby is latched on and positioned properly while  breastfeeding.   Prevent fatigue, stress, and anemia.   Wear a supportive bra, avoiding underwire styles.   Eat a balanced diet with enough fluids.  If you follow these suggestions, your engorgement should improve in 24 to 48 hours. If you are still experiencing difficulty, call your lactation consultant or caregiver. IS MY BABY GETTING ENOUGH MILK? Sometimes, mothers worry about whether their babies are getting enough milk. You can be assured that your baby is getting enough milk if:  The baby is actively sucking and you hear swallowing.   The baby nurses at least 8 to 12 times in a 24 hour time period. Nurse your baby until he or she unlatches or falls asleep at the first breast (at least 10 to 20 minutes), then offer the second side.   The baby is wetting 5 to 6 disposable diapers (6 to 8 cloth diapers) in a 24 hour period by 5 to 6 days of age.   The baby is having at least 2 to 3 stools every 24 hours for the first few months. Breast milk is all the food your baby needs. It is not necessary for your baby to have water or formula. In fact, to help your breasts make more milk, it is best not to give your baby supplemental feedings during the early weeks.   The stool should be soft and yellow.   The baby should gain 4 to 7 ounces per week after he is 4 days old.  TAKE CARE OF YOURSELF Take care of your breasts by:  Bathing or showering daily.   Avoiding the use of soaps on your nipples.   Start feedings on your left breast at one feeding and on your right breast at the next feeding.   You will notice an increase in your milk supply 2 to 5 days after delivery. You may feel some discomfort from engorgement, which makes your breasts very firm and often tender. Engorgement "peaks" out within 24 to 48 hours. In the meantime, apply warm moist towels to your breasts for 5 to 10 minutes before feeding. Gentle massage and expression of some milk before feeding will soften your breasts, making  it easier for your baby to latch on. Wear a well fitting nursing bra and air dry your nipples for 10 to 15 minutes after each feeding.   Only use cotton bra pads.   Only use pure lanolin on your nipples after nursing. You do not need to wash it off before nursing.  Take care of yourself by:   Eating well-balanced meals and nutritious snacks.   Drinking milk, fruit juice, and water to satisfy your thirst (about 8 glasses a day).   Getting plenty of rest.   Increasing calcium in   your diet (1200 mg a day).   Avoiding foods that you notice affect the baby in a bad way.  SEEK MEDICAL CARE IF:   You have any questions or difficulty with breastfeeding.   You need help.   You have a hard, red, sore area on your breast, accompanied by a fever of 100.5 F (38.1 C) or more.   Your baby is too sleepy to eat well or is having trouble sleeping.   Your baby is wetting less than 6 diapers per day, by 5 days of age.   Your baby's skin or white part of his or her eyes is more yellow than it was in the hospital.   You feel depressed.  Document Released: 04/02/2005 Document Revised: 03/22/2011 Document Reviewed: 11/15/2008 Exit[employer] Patient Information 2012 [employer]. 

## 2011-10-17 NOTE — Telephone Encounter (Signed)
Called pt regarding prescriptions which she has not received.  I left the message that a substitution has been made for the mouthwash which is very similar and she may pick it up later today. We are having great difficulty obtaining the prior authorization required from [facility] for the Levalbuterol due to excessive hold times for the medicaid site. We tried several times yesterday and today but have not been able to get through- hopefully it is just because of the holiday week. We will try again next week.

## 2011-10-17 NOTE — Telephone Encounter (Signed)
This is a duplicate, have received several requests.

## 2011-10-17 NOTE — Progress Notes (Signed)
Pt is reporting increased fluid retention.  Although she notes that her breathing feels improved, she and her mother would like a dose of lasix. Will give one time dose. NST reviewed and reactive.

## 2011-10-22 ENCOUNTER — Other Ambulatory Visit: Payer: Medicaid Other

## 2011-10-22 ENCOUNTER — Telehealth: Payer: Self-pay | Admitting: *Deleted

## 2011-10-22 ENCOUNTER — Ambulatory Visit (HOSPITAL_COMMUNITY)
Admission: RE | Admit: 2011-10-22 | Discharge: 2011-10-22 | Disposition: A | Discharge status: Home / Self Care | Payer: Medicaid Other | Source: Ambulatory Visit | Attending: Family | Admitting: Family

## 2011-10-22 VITALS — BP 118/83 | HR 122 | Wt 271.0 lb

## 2011-10-22 DIAGNOSIS — E669 Obesity, unspecified: Secondary | ICD-10-CM | POA: Insufficient documentation

## 2011-10-22 DIAGNOSIS — O34219 Maternal care for unspecified type scar from previous cesarean delivery: Secondary | ICD-10-CM

## 2011-10-22 DIAGNOSIS — R0989 Other specified symptoms and signs involving the circulatory and respiratory systems: Secondary | ICD-10-CM

## 2011-10-22 DIAGNOSIS — J45901 Unspecified asthma with (acute) exacerbation: Secondary | ICD-10-CM

## 2011-10-22 DIAGNOSIS — O3410 Maternal care for benign tumor of corpus uteri, unspecified trimester: Secondary | ICD-10-CM | POA: Insufficient documentation

## 2011-10-22 DIAGNOSIS — O10019 Pre-existing essential hypertension complicating pregnancy, unspecified trimester: Secondary | ICD-10-CM | POA: Insufficient documentation

## 2011-10-22 DIAGNOSIS — O0990 Supervision of high risk pregnancy, unspecified, unspecified trimester: Secondary | ICD-10-CM

## 2011-10-22 DIAGNOSIS — J45909 Unspecified asthma, uncomplicated: Secondary | ICD-10-CM

## 2011-10-22 DIAGNOSIS — O99519 Diseases of the respiratory system complicating pregnancy, unspecified trimester: Secondary | ICD-10-CM

## 2011-10-22 DIAGNOSIS — O09529 Supervision of elderly multigravida, unspecified trimester: Secondary | ICD-10-CM | POA: Insufficient documentation

## 2011-10-22 MED ORDER — POTASSIUM CHLORIDE CRYS ER 20 MEQ PO TBCR: 20.0000 meq | EXTENDED_RELEASE_TABLET | Freq: Every day | ORAL | Status: DC

## 2011-10-22 NOTE — Telephone Encounter (Signed)
Pt was given a one time dose of lasix, she wanted to know if she needs to get a prescription for potassium too. I informed her we would check with Dr. Shawnie Pons and let her know.

## 2011-10-22 NOTE — Progress Notes (Signed)
Patient seen for follow up BPP.  See report in [facility].  Alpha Gula, MD  Single IUP at 35 0/7 weeks Normal amniotic fluid volume The fetus is active with a BPP of 8/8   Continue 2x weekly testing- weekly BPPs at [facility] and NSTs in clinic.

## 2011-10-22 NOTE — Telephone Encounter (Signed)
Pt notified to pick up medicine at the pharmacy.

## 2011-10-25 ENCOUNTER — Encounter: Payer: Self-pay | Admitting: Family Medicine

## 2011-10-25 ENCOUNTER — Ambulatory Visit (INDEPENDENT_AMBULATORY_CARE_PROVIDER_SITE_OTHER): Payer: Medicaid Other | Admitting: Family Medicine

## 2011-10-25 VITALS — BP 124/74 | Temp 98.2°F | Wt 271.8 lb

## 2011-10-25 DIAGNOSIS — I10 Essential (primary) hypertension: Secondary | ICD-10-CM

## 2011-10-25 DIAGNOSIS — M545 Low back pain: Secondary | ICD-10-CM

## 2011-10-25 DIAGNOSIS — D649 Anemia, unspecified: Secondary | ICD-10-CM

## 2011-10-25 DIAGNOSIS — O139 Gestational [pregnancy-induced] hypertension without significant proteinuria, unspecified trimester: Secondary | ICD-10-CM

## 2011-10-25 DIAGNOSIS — O09529 Supervision of elderly multigravida, unspecified trimester: Secondary | ICD-10-CM

## 2011-10-25 DIAGNOSIS — O0990 Supervision of high risk pregnancy, unspecified, unspecified trimester: Secondary | ICD-10-CM

## 2011-10-25 LAB — POCT URINALYSIS DIP (DEVICE)
Glucose, UA: NEGATIVE mg/dL
Hgb urine dipstick: NEGATIVE
Leukocytes, UA: NEGATIVE
Nitrite: NEGATIVE
Urobilinogen, UA: 0.2 mg/dL (ref 0.0–1.0)
pH: 6.5 (ref 5.0–8.0)

## 2011-10-25 MED ORDER — FUROSEMIDE 40 MG PO TABS: 40.0000 mg | ORAL_TABLET | Freq: Every day | ORAL | Status: DC

## 2011-10-25 NOTE — Progress Notes (Signed)
NST reviewed and reactive. Fills like her breathing is slowly improving. Likes her Lasix.

## 2011-10-25 NOTE — Telephone Encounter (Signed)
Pt seen for prenatal visit today @ clinic. I informed her of the continued difficulty we are having with obtaining pre-auth from [facility] for the Rx Levalbuteral. We continue to experience very long hold times and no one ever picks up the line so that we can talk with them.  I requested that she follow up with her pulmonologist's office and see if there is an alternative that can be prescribed or if their staff could facilitate the pre-auth.  Pt voiced understanding and said she will call her pulmonologist.

## 2011-10-25 NOTE — Patient Instructions (Signed)
Breastfeeding BENEFITS OF BREASTFEEDING For the baby  The first milk (colostrum) helps the baby's digestive system function better.   There are antibodies from the mother in the milk that help the baby fight off infections.   The baby has a lower incidence of asthma, allergies, and SIDS (sudden infant death syndrome).   The nutrients in breast milk are better than formulas for the baby and helps the baby's brain grow better.   Babies who breastfeed have less gas, colic, and constipation.  For the mother  Breastfeeding helps develop a very special bond between mother and baby.   It is more convenient, always available at the correct temperature and cheaper than formula feeding.   It burns calories in the mother and helps with losing weight that was gained during pregnancy.   It makes the uterus contract back down to normal size faster and slows bleeding following delivery.   Breastfeeding mothers have a lower risk of developing breast cancer.  NURSE FREQUENTLY  A healthy, full-term baby may breastfeed as often as every hour or space his or her feedings to every 3 hours.   How often to nurse will vary from baby to baby. Watch your baby for signs of hunger, not the clock.   Nurse as often as the baby requests, or when you feel the need to reduce the fullness of your breasts.   Awaken the baby if it has been 3 to 4 hours since the last feeding.   Frequent feeding will help the mother make more milk and will prevent problems like sore nipples and engorgement of the breasts.  BABY'S POSITION AT THE BREAST  Whether lying down or sitting, be sure that the baby's tummy is facing your tummy.   Support the breast with 4 fingers underneath the breast and the thumb above. Make sure your fingers are well away from the nipple and baby's mouth.   Stroke the baby's lips and cheek closest to the breast gently with your finger or nipple.   When the baby's mouth is open wide enough, place all  of your nipple and as much of the dark area around the nipple as possible into your baby's mouth.   Pull the baby in close so the tip of the nose and the baby's cheeks touch the breast during the feeding.  FEEDINGS  The length of each feeding varies from baby to baby and from feeding to feeding.   The baby must suck about 2 to 3 minutes for your milk to get to him or her. This is called a "let down." For this reason, allow the baby to feed on each breast as long as he or she wants. Your baby will end the feeding when he or she has received the right balance of nutrients.   To break the suction, put your finger into the corner of the baby's mouth and slide it between his or her gums before removing your breast from his or her mouth. This will help prevent sore nipples.  REDUCING BREAST ENGORGEMENT  In the first week after your baby is born, you may experience signs of breast engorgement. When breasts are engorged, they feel heavy, warm, full, and may be tender to the touch. You can reduce engorgement if you:   Nurse frequently, every 2 to 3 hours. Mothers who breastfeed early and often have fewer problems with engorgement.   Place light ice packs on your breasts between feedings. This reduces swelling. Wrap the ice packs in a   lightweight towel to protect your skin.   Apply moist hot packs to your breast for 5 to 10 minutes before each feeding. This increases circulation and helps the milk flow.   Gently massage your breast before and during the feeding.   Make sure that the baby empties at least one breast at every feeding before switching sides.   Use a breast pump to empty the breasts if your baby is sleepy or not nursing well. You may also want to pump if you are returning to work or or you feel you are getting engorged.   Avoid bottle feeds, pacifiers or supplemental feedings of water or juice in place of breastfeeding.   Be sure the baby is latched on and positioned properly while  breastfeeding.   Prevent fatigue, stress, and anemia.   Wear a supportive bra, avoiding underwire styles.   Eat a balanced diet with enough fluids.  If you follow these suggestions, your engorgement should improve in 24 to 48 hours. If you are still experiencing difficulty, call your lactation consultant or caregiver. IS MY BABY GETTING ENOUGH MILK? Sometimes, mothers worry about whether their babies are getting enough milk. You can be assured that your baby is getting enough milk if:  The baby is actively sucking and you hear swallowing.   The baby nurses at least 8 to 12 times in a 24 hour time period. Nurse your baby until he or she unlatches or falls asleep at the first breast (at least 10 to 20 minutes), then offer the second side.   The baby is wetting 5 to 6 disposable diapers (6 to 8 cloth diapers) in a 24 hour period by 5 to 6 days of age.   The baby is having at least 2 to 3 stools every 24 hours for the first few months. Breast milk is all the food your baby needs. It is not necessary for your baby to have water or formula. In fact, to help your breasts make more milk, it is best not to give your baby supplemental feedings during the early weeks.   The stool should be soft and yellow.   The baby should gain 4 to 7 ounces per week after he is 4 days old.  TAKE CARE OF YOURSELF Take care of your breasts by:  Bathing or showering daily.   Avoiding the use of soaps on your nipples.   Start feedings on your left breast at one feeding and on your right breast at the next feeding.   You will notice an increase in your milk supply 2 to 5 days after delivery. You may feel some discomfort from engorgement, which makes your breasts very firm and often tender. Engorgement "peaks" out within 24 to 48 hours. In the meantime, apply warm moist towels to your breasts for 5 to 10 minutes before feeding. Gentle massage and expression of some milk before feeding will soften your breasts, making  it easier for your baby to latch on. Wear a well fitting nursing bra and air dry your nipples for 10 to 15 minutes after each feeding.   Only use cotton bra pads.   Only use pure lanolin on your nipples after nursing. You do not need to wash it off before nursing.  Take care of yourself by:   Eating well-balanced meals and nutritious snacks.   Drinking milk, fruit juice, and water to satisfy your thirst (about 8 glasses a day).   Getting plenty of rest.   Increasing calcium in   your diet (1200 mg a day).   Avoiding foods that you notice affect the baby in a bad way.  SEEK MEDICAL CARE IF:   You have any questions or difficulty with breastfeeding.   You need help.   You have a hard, red, sore area on your breast, accompanied by a fever of 100.5 F (38.1 C) or more.   Your baby is too sleepy to eat well or is having trouble sleeping.   Your baby is wetting less than 6 diapers per day, by 5 days of age.   Your baby's skin or white part of his or her eyes is more yellow than it was in the hospital.   You feel depressed.  Document Released: 04/02/2005 Document Revised: 03/22/2011 Document Reviewed: 11/15/2008 Exit[employer] Patient Information 2012 [employer]. 

## 2011-10-25 NOTE — Progress Notes (Signed)
P=122 

## 2011-10-29 ENCOUNTER — Ambulatory Visit (HOSPITAL_COMMUNITY)
Admission: RE | Admit: 2011-10-29 | Discharge: 2011-10-29 | Disposition: A | Discharge status: Home / Self Care | Payer: Medicaid Other | Source: Ambulatory Visit | Attending: Family Medicine | Admitting: Family Medicine

## 2011-10-29 VITALS — BP 114/71 | HR 122 | Wt 271.0 lb

## 2011-10-29 DIAGNOSIS — O34219 Maternal care for unspecified type scar from previous cesarean delivery: Secondary | ICD-10-CM | POA: Insufficient documentation

## 2011-10-29 DIAGNOSIS — O10019 Pre-existing essential hypertension complicating pregnancy, unspecified trimester: Secondary | ICD-10-CM | POA: Insufficient documentation

## 2011-10-29 DIAGNOSIS — J45901 Unspecified asthma with (acute) exacerbation: Secondary | ICD-10-CM

## 2011-10-29 DIAGNOSIS — E669 Obesity, unspecified: Secondary | ICD-10-CM | POA: Insufficient documentation

## 2011-10-29 DIAGNOSIS — O0990 Supervision of high risk pregnancy, unspecified, unspecified trimester: Secondary | ICD-10-CM

## 2011-10-29 DIAGNOSIS — O99519 Diseases of the respiratory system complicating pregnancy, unspecified trimester: Secondary | ICD-10-CM

## 2011-10-29 DIAGNOSIS — O3410 Maternal care for benign tumor of corpus uteri, unspecified trimester: Secondary | ICD-10-CM | POA: Insufficient documentation

## 2011-10-29 DIAGNOSIS — O09529 Supervision of elderly multigravida, unspecified trimester: Secondary | ICD-10-CM | POA: Insufficient documentation

## 2011-10-29 DIAGNOSIS — O99210 Obesity complicating pregnancy, unspecified trimester: Secondary | ICD-10-CM | POA: Insufficient documentation

## 2011-10-29 NOTE — Progress Notes (Signed)
Patient seen for follow up BPP.  See report in [facility].  Alpha Gula, MD  Single IUP at 36 0/7 weeks Normal amniotic fluid volume The fetus is active with a BPP of 8/8  Continue 2x weekly testing- weekly BPPs at [facility] and NSTs in clinic.

## 2011-10-30 ENCOUNTER — Ambulatory Visit (INDEPENDENT_AMBULATORY_CARE_PROVIDER_SITE_OTHER): Payer: Medicaid Other | Admitting: Pulmonary Disease

## 2011-10-30 ENCOUNTER — Encounter: Payer: Self-pay | Admitting: Pulmonary Disease

## 2011-10-30 VITALS — BP 118/64 | HR 120 | Temp 98.4°F | Ht 62.0 in | Wt 273.0 lb

## 2011-10-30 DIAGNOSIS — J45901 Unspecified asthma with (acute) exacerbation: Secondary | ICD-10-CM

## 2011-10-30 MED ORDER — ALBUTEROL SULFATE (2.5 MG/3ML) 0.083% IN NEBU
2.5000 mg | INHALATION_SOLUTION | Freq: Once | RESPIRATORY_TRACT | Status: AC
  Administered 2011-10-30: 2.5 mg via RESPIRATORY_TRACT

## 2011-10-30 MED ORDER — PREDNISONE 10 MG PO TABS: ORAL_TABLET | ORAL | Status: DC

## 2011-10-30 MED ORDER — AMOXICILLIN 500 MG PO CAPS: 500.0000 mg | ORAL_CAPSULE | Freq: Three times a day (TID) | ORAL | Status: DC

## 2011-10-30 NOTE — Patient Instructions (Signed)
Amox 500 thrice daily x 7 days Trial of symbicort 160 2 puffs twice daily Prednisone 10 mg  Take 4 tabs  daily with food x 4 days, then 3 tabs daily x 4 days, then 2 tabs daily x 4 days, then 1 tab daily x4 days then stop. #40

## 2011-10-30 NOTE — Progress Notes (Signed)
  Subjective:    Patient ID: Cheyenne Arias, female    DOB: [birth year], 36 y.o.   MRN: [id number]  HPI  36 yo G3 P2 female seen for initial pulmonary consult during hospitalization 09/19/11 for asthma flare during pregnancy .  She has history of mild, intermittent asthma.  She has hypertension. She has a history of mild sleep apnea (PSG 03/27/11>>AHI 10.9), and is on CPAP 10 cm H2O.     10/30/2011 Admitted 6/1-6/14 /13 for asthma exacerbation during pregnancy-c/b sinusitis, OSA  Tx w aggressive pulmonary hygiene , abx and steroids along with CPAP. Started on oxygen due to desaturations noted by Ob service. Ct angio, duplex neg, echo -nml LV fn Seen on 10/09/2011 for post hospital visit  Developed an acne like rash along chest , back and upper arms , went to ER , rx short steroid taper on 6/23. cpap - was on 10 now On auto 5-15  c section planned for 8/5 /13  Acute visit today for  c/o chest congestion, cough w/ clumps of green phlem, increase in SOB chest heaviness that last all day x 3 days. she has been using the pulmicort q12hrs and atrovent every 4 hours. Since discharge feels better but still gets winded easily.  Some wheezing on /off.  Wears O2 continuously.  Wearing CPAP every night.  Does have some congestion-clear.  Finished prednisone .     Review of Systems Pt denies any significant  nasal congestion or excess secretions, fever, chills, sweats, unintended wt loss, pleuritic or exertional cp, orthopnea pnd or leg swelling.  Pt also denies any obvious fluctuation in symptoms with weather or environmental change or other alleviating or aggravating factors.    Pt denies any increase in rescue therapy over baseline, denies waking up needing it or having early am exacerbations or coughing/wheezing/ or dyspnea      Objective:   Physical Exam  Gen. Pleasant, obese, in no distress, normal affect ENT - no lesions, no post nasal drip, class 2-3 airway Neck: No JVD, no thyromegaly,  no carotid bruits Lungs: no use of accessory muscles, no dullness to percussion, decreased without rales, faint exp rhonchi BL Cardiovascular: Rhythm regular, heart sounds  normal, no murmurs or gallops, no peripheral edema Abdomen: soft and non-tender, no hepatosplenomegaly, BS normal. Musculoskeletal: No deformities, no cyanosis or clubbing Neuro:  alert, non focal, no tremors        Assessment & Plan:

## 2011-10-30 NOTE — Assessment & Plan Note (Signed)
Mild intermittent with flare in 3rd trimester - unclear why this has flared up now, but note duplex, CT angio neg & echo nml Albuterol neb given in office Pred taper over 2 weeks starting at 40 mg  - may have to check CBG intermittently Add symbicort to current regimen OK to use albuterol nebs for now - unless tachycardia remains persistent problem - xopenex will require approval Amox x 7 ds to rx for bronchitis FU in 10 ds, sooner if no better Note c section planned for 8/5

## 2011-11-01 ENCOUNTER — Ambulatory Visit (INDEPENDENT_AMBULATORY_CARE_PROVIDER_SITE_OTHER): Payer: Medicaid Other | Admitting: Obstetrics & Gynecology

## 2011-11-01 VITALS — BP 127/79 | Temp 97.5°F | Wt 271.6 lb

## 2011-11-01 DIAGNOSIS — I10 Essential (primary) hypertension: Secondary | ICD-10-CM

## 2011-11-01 DIAGNOSIS — Z3490 Encounter for supervision of normal pregnancy, unspecified, unspecified trimester: Secondary | ICD-10-CM

## 2011-11-01 DIAGNOSIS — O139 Gestational [pregnancy-induced] hypertension without significant proteinuria, unspecified trimester: Secondary | ICD-10-CM

## 2011-11-01 LAB — POCT URINALYSIS DIP (DEVICE)
Bilirubin Urine: NEGATIVE
Glucose, UA: NEGATIVE mg/dL
Leukocytes, UA: NEGATIVE
Nitrite: NEGATIVE

## 2011-11-01 NOTE — Patient Instructions (Signed)
Sterilization, Women Sterilization is a surgical procedure. This surgery permanently prevents pregnancy in women. This can be done by tying (with or without cutting) the fallopian tubes or burning the tubes closed (tubal ligation). Tubal ligation blocks the tubes and prevents the egg from being fertilized by the sperm. Sterilization can be done by removing the ovaries that produce the egg (castration) as well. Sterilization is considered safe with very rare complications. It does not affect menstrual periods, sexual desire, or performance.  Since sterilization is considered permanent, you should not do it until you are sure you do not want to have more children. You and your partner should fully agree to have the procedure. Your decision to have the procedure should not be made when you are in a stressful situation. This can include a loss of a pregnancy, illness or death of a spouse, or divorce. There are other means of preventing unwanted pregnancies that can be used until you are completely sure you want to be sterilized. Sterilization does not protect against sexually transmitted disease. Women who had a sterilization procedure and want it reversed must know that it requires an expensive and major operation. The reversal may not be successful and has a high rate of tubal (ectopic) pregnancy that can be dangerous and require surgery. There are several ways to perform a tubal sterlization:  Laparoscopy. The abdomen is filled with a gas to see the pelvic organs. Then, a tube with a light attached is inserted into the abdomen through 2 small incisions. The fallopian tubes are blocked with a ring, clip or electrocautery to burn closed the tubes. Then, the gas is released and the small incisions are closed.   Hysteroscopy. A tube with a light is inserted in the vagina, through the cervix and then into the uterus. A spring-like instrument is inserted into the opening of the fallopian tubes. The spring causes  scaring and blocks the tubes. Other forms of contraception should be used for three months at which time an X-ray is done to be sure the tubes are blocked.   Minilaparotomy. This is done right after giving birth. A small incision is made under the belly button and the tubes are exposed. The tubes can then be burned, tied and/or cut.   Tubal ligation can be done during a Cesarean section.   Castration is a surgical procedure that removes both ovaries.  Tubal sterilization should be discussed with your caregiver to answer any concerns you or your partner might have. This meeting will help to decide for sure if the operation is safe for you and which procedure is the best one for you. You can change your mind and cancel the surgery at any time. HOME CARE INSTRUCTIONS   Follow your caregivers instructions regarding diet, rest, work, social and sexual activities and follow up appointments.   Shoulder pain is common following a laparoscopy. The pain may be relieved by lying down flat.   Only take over-the-counter or prescription medicines for pain, discomfort or fever as directed by your caregiver.   You may use lozenges for throat discomfort.   Keep the incisions covered to prevent infection.  SEEK IMMEDIATE MEDICAL CARE IF:   You develop a temperature of 102 F (38.9 C), or as your caregiver suggests.   You become dizzy or faint.   You start to feel sick to your stomach (nausea) or throw up (vomit).   You develop abdominal pain not relieved with over-the-counter medications.   You have redness and puffiness (  swelling) of the cut (incision).   You see pus draining from the incision.   You miss a menstrual period.  Document Released: 09/19/2007 Document Revised: 03/22/2011 Document Reviewed: 09/19/2007 [employer] Patient Information 2012 [employer]. 

## 2011-11-01 NOTE — Progress Notes (Signed)
Reactive NST, with schedule growth [place]. On prednisone now. Feels better. Info on sterilization given

## 2011-11-01 NOTE — Addendum Note (Signed)
Addended by: Faythe Casa on: 11/01/2011 10:20 AM   Modules accepted: Orders

## 2011-11-01 NOTE — Addendum Note (Signed)
Addended by: Freddi Starr on: 11/01/2011 11:28 AM   Modules accepted: Orders

## 2011-11-01 NOTE — Progress Notes (Signed)
Pt reports she saw pulmonologist on 7/16 due to productive cough- was started on new meds.

## 2011-11-01 NOTE — Progress Notes (Signed)
Pulse 118. Edema trace on lower abdomen and BLE. C/o pain on lower abdomen; no pressure.

## 2011-11-04 LAB — CULTURE, BETA STREP (GROUP B ONLY)

## 2011-11-05 ENCOUNTER — Encounter (HOSPITAL_COMMUNITY): Payer: Self-pay

## 2011-11-05 ENCOUNTER — Ambulatory Visit (HOSPITAL_COMMUNITY)
Admission: RE | Admit: 2011-11-05 | Discharge: 2011-11-05 | Disposition: A | Discharge status: Home / Self Care | Payer: Medicaid Other | Source: Ambulatory Visit | Attending: Family Medicine | Admitting: Family Medicine

## 2011-11-05 ENCOUNTER — Other Ambulatory Visit: Payer: Self-pay | Admitting: Obstetrics & Gynecology

## 2011-11-05 VITALS — BP 101/62 | HR 106 | Wt 273.0 lb

## 2011-11-05 DIAGNOSIS — O34219 Maternal care for unspecified type scar from previous cesarean delivery: Secondary | ICD-10-CM | POA: Insufficient documentation

## 2011-11-05 DIAGNOSIS — O09529 Supervision of elderly multigravida, unspecified trimester: Secondary | ICD-10-CM | POA: Insufficient documentation

## 2011-11-05 DIAGNOSIS — E669 Obesity, unspecified: Secondary | ICD-10-CM | POA: Insufficient documentation

## 2011-11-05 DIAGNOSIS — O10019 Pre-existing essential hypertension complicating pregnancy, unspecified trimester: Secondary | ICD-10-CM | POA: Insufficient documentation

## 2011-11-05 DIAGNOSIS — J45909 Unspecified asthma, uncomplicated: Secondary | ICD-10-CM

## 2011-11-05 DIAGNOSIS — J45901 Unspecified asthma with (acute) exacerbation: Secondary | ICD-10-CM

## 2011-11-05 DIAGNOSIS — O0990 Supervision of high risk pregnancy, unspecified, unspecified trimester: Secondary | ICD-10-CM

## 2011-11-05 DIAGNOSIS — O3410 Maternal care for benign tumor of corpus uteri, unspecified trimester: Secondary | ICD-10-CM | POA: Insufficient documentation

## 2011-11-08 ENCOUNTER — Ambulatory Visit (INDEPENDENT_AMBULATORY_CARE_PROVIDER_SITE_OTHER): Payer: Medicaid Other | Admitting: Obstetrics & Gynecology

## 2011-11-08 ENCOUNTER — Telehealth: Payer: Self-pay | Admitting: *Deleted

## 2011-11-08 VITALS — BP 131/82 | Wt 269.4 lb

## 2011-11-08 DIAGNOSIS — J45909 Unspecified asthma, uncomplicated: Secondary | ICD-10-CM

## 2011-11-08 DIAGNOSIS — O139 Gestational [pregnancy-induced] hypertension without significant proteinuria, unspecified trimester: Secondary | ICD-10-CM

## 2011-11-08 DIAGNOSIS — N898 Other specified noninflammatory disorders of vagina: Secondary | ICD-10-CM

## 2011-11-08 DIAGNOSIS — I10 Essential (primary) hypertension: Secondary | ICD-10-CM

## 2011-11-08 LAB — POCT URINALYSIS DIP (DEVICE)
Bilirubin Urine: NEGATIVE
Glucose, UA: NEGATIVE mg/dL
Hgb urine dipstick: NEGATIVE
Nitrite: NEGATIVE
Nitrite: NEGATIVE
Urobilinogen, UA: 0.2 mg/dL (ref 0.0–1.0)
pH: 6 (ref 5.0–8.0)

## 2011-11-08 LAB — GLUCOSE, CAPILLARY: Glucose-Capillary: 124 mg/dL — ABNORMAL HIGH (ref 70–99)

## 2011-11-08 MED ORDER — FLUCONAZOLE 150 MG PO TABS: 150.0000 mg | ORAL_TABLET | Freq: Once | ORAL | Status: DC

## 2011-11-08 NOTE — Telephone Encounter (Signed)
Received call from pt re:  Pt saw her OB Dr. Penne Lash today, and was informed that pt might need iron infusion prior to baby's delivery.   Informed pt that Dr. Dalene Carrow will not be back in office until 11/13/11.   Informed pt that message will be relayed to md for review.   Pt understood that she will be contacted with md's decision. Pt's   Phone    [phone].

## 2011-11-08 NOTE — Progress Notes (Signed)
P=121, c/o pain on right side intermittently for few days, states hurts if lies down flat, but if lies down in recliner doesn't hurt.

## 2011-11-08 NOTE — Progress Notes (Signed)
Pt did not take Symbicort because she was scared to take more meds being pregnant.  Pt still has wheezes.  Pt also has not started xopenox.  Will have SW see what is the delay with insurance.  Pt may need IV iron.  Dr. Dalene Carrow was sent inbasket message about iron recommendations.  Sorayah is an established patient of hers.  Pt on prednisone.  Will check CBG today.  Will have her follow back up with Dr. Vassie Loll.  Pt to use condoms for birth control.  Advised to use something more reliable.  Last growth 41%.  Continue 2x week testing.  Random blood sugar is 122.

## 2011-11-08 NOTE — Progress Notes (Signed)
Pt states she has an appt with pulmonologist tomorrow and will inquire about xopenox Rx pre authorization

## 2011-11-09 ENCOUNTER — Ambulatory Visit (INDEPENDENT_AMBULATORY_CARE_PROVIDER_SITE_OTHER): Payer: Medicaid Other | Admitting: Pulmonary Disease

## 2011-11-09 ENCOUNTER — Encounter: Payer: Self-pay | Admitting: Pulmonary Disease

## 2011-11-09 VITALS — BP 110/60 | HR 124 | Temp 98.8°F | Ht 62.0 in | Wt 272.8 lb

## 2011-11-09 DIAGNOSIS — J45901 Unspecified asthma with (acute) exacerbation: Secondary | ICD-10-CM

## 2011-11-09 DIAGNOSIS — G4733 Obstructive sleep apnea (adult) (pediatric): Secondary | ICD-10-CM

## 2011-11-09 DIAGNOSIS — R0902 Hypoxemia: Secondary | ICD-10-CM

## 2011-11-09 MED ORDER — BUDESONIDE 0.5 MG/2ML IN SUSP: 0.5000 mg | Freq: Two times a day (BID) | RESPIRATORY_TRACT | Status: DC

## 2011-11-09 NOTE — Assessment & Plan Note (Signed)
She is to continue CPAP. 

## 2011-11-09 NOTE — Progress Notes (Signed)
Chief Complaint  Patient presents with  . Follow-up    Breathing is better, cough w/ occasional phlem, chest congestion. no wheezing or chest tx right now  . cpap    wears cpap machine everynight. denies any problems with mask/machine    History of Present Illness: Cheyenne Arias is a 36 y.o. female with asthma, OSA, OHS.  She is scheduled for C-section on August 5.  She was seen by Dr. Vassie Loll on 07/16.  She was given prednisone taper.  She is still taking prednisone.  She finished amoxicillin course.  Her breathing is better.  She is using pulmicort nebulizer bid, and duoneb every 6 hours.  She is using her CPAP, and this helps.  She still has some cough, but not much wheeze or chest congestion.  Her sinuses are doing okay.  She is using 2 liters oxygen 24/7.  She did not start symbicort because she was scared of changing her medicines more during her pregnancy.   Past Medical History  Diagnosis Date  . Hypertension   . Migraine   . Anxiety   . Abnormal Pap smear   . Anemia   . Fibroids   . Fibromyalgia     degenerative disc disease  . Depression   . Sleep apnea   . Asthma     Past Surgical History  Procedure Date  . Wisdom tooth extraction   . Mandible surgery   . Carpal tunnel release   . Cesarean section     1994, 1996     Outpatient Encounter Prescriptions as of 11/09/2011  Medication Sig Dispense Refill  . albuterol (PROVENTIL HFA;VENTOLIN HFA) 108 (90 BASE) MCG/ACT inhaler Inhale 2 puffs into the lungs every 6 (six) hours as needed. As needed for asthma/shortness of breath      . amLODipine (NORVASC) 10 MG tablet Take 1 tablet (10 mg total) by mouth daily.  31 tablet  6  . budesonide (PULMICORT) 0.5 MG/2ML nebulizer solution Take 2 mLs (0.5 mg total) by nebulization 2 (two) times daily.  60 mL  3  . cyclobenzaprine (FLEXERIL) 10 MG tablet Take 10 mg by mouth 3 (three) times daily as needed.      . Diphenhyd-HC-Nystatin-Tetracyc (FIRST-MARYS MOUTHWASH) SUSP  Use as directed 15 mLs in the mouth or throat 3 (three) times daily.  1 Bottle  0  . diphenhydramine-acetaminophen (TYLENOL PM) 25-500 MG TABS Take 1 tablet by mouth at bedtime as needed. Takes for sleep      . fluticasone (FLONASE) 50 MCG/ACT nasal spray Place 2 sprays into the nose 2 (two) times daily.  16 g  12  . furosemide (LASIX) 40 MG tablet Take 1 tablet (40 mg total) by mouth daily.  10 tablet  1  . ipratropium-albuterol (DUONEB) 0.5-2.5 (3) MG/3ML SOLN Take 3 mLs by nebulization every 4 (four) hours as needed.      . loratadine (CLARITIN) 10 MG tablet Take 1 tablet (10 mg total) by mouth daily.  31 tablet  12  . metoprolol tartrate (LOPRESSOR) 25 MG tablet Take 1 tablet (25 mg total) by mouth 2 (two) times daily.  360 tablet  6  . oxyCODONE-acetaminophen (PERCOCET) 5-325 MG per tablet Take 3 tablets by mouth every 8 (eight) hours as needed.      . pantoprazole (PROTONIX) 40 MG tablet Take 1 tablet (40 mg total) by mouth daily.  31 tablet  6  . potassium chloride SA (K-DUR,KLOR-CON) 20 MEQ tablet Take 1 tablet (20 mEq total) by  mouth daily.  10 tablet  1  . predniSONE (DELTASONE) 10 MG tablet Take with food 4 tabs daily x 4 days, 3 tabs daily x 4 days, 2 tabs daily x 4 days, 1 tab daily x 4 days then stop  40 tablet  0  . prenatal vitamin w/FE, FA (NATACHEW) 29-1 MG CHEW Chew 1 tablet by mouth daily.  60 tablet  3  . DISCONTD: albuterol (PROVENTIL) (5 MG/ML) 0.5% nebulizer solution Take 2.5 mg by nebulization every 6 (six) hours as needed.      [name] DISCONTD: fluconazole (DIFLUCAN) 150 MG tablet Take 1 tablet (150 mg total) by mouth once.  1 tablet  0  . DISCONTD: ipratropium (ATROVENT) 0.02 % nebulizer solution Take 500 mcg by nebulization every 4 (four) hours.       [name] DISCONTD: levalbuterol (XOPENEX) 0.63 MG/3ML nebulizer solution Take 3 mLs (0.63 mg total) by nebulization every 6 (six) hours.  3 mL  5    Allergies  Allergen Reactions  . Latex Itching and Swelling    Blisters  .  Morphine And Related Itching    Physical Exam:  Blood pressure 110/60, pulse 124, temperature 98.8 F (37.1 C), temperature source Oral, height 5\' 2"  (1.575 m), weight 272 lb 12.8 oz (123.741 kg), last menstrual period 02/19/2011, SpO2 98.00%.  Body mass index is 49.90 kg/(m^2). Wt Readings from Last 2 Encounters:  11/09/11 272 lb 12.8 oz (123.741 kg)  11/08/11 269 lb 6.4 oz (122.199 kg)    General - no distress, using oxygen HEENT - no sinus tenderness, no oral exudate, no LAN Cardiac - s1s2 tachycardic, no murmur Chest - no wheeze/rales Abd - gravid uterus Ext - minimal ankle edema Neuro - normal strength Psych - normal mood behavior   Assessment/Plan:  [name], MD [facility] Pager:  [phone] 11/09/2011, 4:51 PM

## 2011-11-09 NOTE — Assessment & Plan Note (Signed)
She has improved since resuming prednisone.  She is to continue prednisone taper, pulmicort and duoneb.    I have advised her to call if her breathing gets worse before her C section.  Pulmonary service can be available in hospital after delivery.  Otherwise will plan to re-assess her status in 8 weeks.

## 2011-11-09 NOTE — Patient Instructions (Signed)
Follow up in 8 weeks>>call if help needed sooner

## 2011-11-09 NOTE — Assessment & Plan Note (Signed)
Likely related to OHS.  She is to continue supplemental oxygen at 2 liters 24/7.  Will re-assess her oxygen needs after her delivery.

## 2011-11-10 MED ORDER — DEXAMETHASONE SODIUM PHOSPHATE 10 MG/ML IJ SOLN
INTRAMUSCULAR | Status: AC
  Filled 2011-11-10: qty 1

## 2011-11-10 MED ORDER — SODIUM BICARBONATE 8.4 % IV SOLN
INTRAVENOUS | Status: AC
  Filled 2011-11-10: qty 50

## 2011-11-10 MED ORDER — FENTANYL CITRATE 0.05 MG/ML IJ SOLN
INTRAMUSCULAR | Status: AC
  Filled 2011-11-10: qty 2

## 2011-11-10 MED ORDER — ONDANSETRON HCL 4 MG/2ML IJ SOLN
INTRAMUSCULAR | Status: AC
  Filled 2011-11-10: qty 2

## 2011-11-10 MED ORDER — LIDOCAINE-EPINEPHRINE (PF) 2 %-1:200000 IJ SOLN
INTRAMUSCULAR | Status: AC
  Filled 2011-11-10: qty 20

## 2011-11-10 MED ORDER — MIDAZOLAM HCL 2 MG/2ML IJ SOLN
INTRAMUSCULAR | Status: AC
  Filled 2011-11-10: qty 2

## 2011-11-12 ENCOUNTER — Ambulatory Visit (HOSPITAL_COMMUNITY)
Admission: RE | Admit: 2011-11-12 | Discharge: 2011-11-12 | Disposition: A | Discharge status: Home / Self Care | Payer: Medicaid Other | Source: Ambulatory Visit | Attending: Obstetrics & Gynecology | Admitting: Obstetrics & Gynecology

## 2011-11-12 VITALS — BP 124/72 | HR 120 | Wt 271.0 lb

## 2011-11-12 DIAGNOSIS — O99519 Diseases of the respiratory system complicating pregnancy, unspecified trimester: Secondary | ICD-10-CM

## 2011-11-12 DIAGNOSIS — O34219 Maternal care for unspecified type scar from previous cesarean delivery: Secondary | ICD-10-CM | POA: Insufficient documentation

## 2011-11-12 DIAGNOSIS — O99210 Obesity complicating pregnancy, unspecified trimester: Secondary | ICD-10-CM | POA: Insufficient documentation

## 2011-11-12 DIAGNOSIS — O09529 Supervision of elderly multigravida, unspecified trimester: Secondary | ICD-10-CM | POA: Insufficient documentation

## 2011-11-12 DIAGNOSIS — J45909 Unspecified asthma, uncomplicated: Secondary | ICD-10-CM

## 2011-11-12 DIAGNOSIS — O0990 Supervision of high risk pregnancy, unspecified, unspecified trimester: Secondary | ICD-10-CM

## 2011-11-12 DIAGNOSIS — O3410 Maternal care for benign tumor of corpus uteri, unspecified trimester: Secondary | ICD-10-CM | POA: Insufficient documentation

## 2011-11-12 DIAGNOSIS — O10019 Pre-existing essential hypertension complicating pregnancy, unspecified trimester: Secondary | ICD-10-CM | POA: Insufficient documentation

## 2011-11-12 DIAGNOSIS — E669 Obesity, unspecified: Secondary | ICD-10-CM

## 2011-11-12 NOTE — Progress Notes (Signed)
Patient seen for follow up BPP.  See report in [facility].  Alpha Gula, MD  Single living intrauterine pregnancy at 38 weeks 0 days. The fetus is active with a BPP of 8/8 Normal amniotic fluid volume  Continue 2x weekly testing- weekly BPPs at [facility] and NSTs in clinic.

## 2011-11-13 ENCOUNTER — Telehealth: Payer: Self-pay | Admitting: Nurse Practitioner

## 2011-11-13 NOTE — Telephone Encounter (Signed)
Spoke with patient- informed per Dr. Dalene Carrow- per [facility] protocol we do not advise IV iron during pregnancy.  Will have to wait until after she gives birth and will readdress.  Pt verbalized understanding.

## 2011-11-14 ENCOUNTER — Encounter (HOSPITAL_COMMUNITY)
Admission: RE | Admit: 2011-11-14 | Discharge: 2011-11-14 | Disposition: A | Discharge status: Home / Self Care | Payer: Medicaid Other | Source: Ambulatory Visit | Attending: Obstetrics & Gynecology | Admitting: Obstetrics & Gynecology

## 2011-11-14 ENCOUNTER — Encounter (HOSPITAL_COMMUNITY): Payer: Self-pay

## 2011-11-14 ENCOUNTER — Inpatient Hospital Stay (HOSPITAL_COMMUNITY)
Admission: AD | Admit: 2011-11-14 | Discharge: 2011-11-14 | Disposition: A | Discharge status: Home / Self Care | Payer: Medicaid Other | Source: Ambulatory Visit | Attending: Obstetrics and Gynecology | Admitting: Obstetrics and Gynecology

## 2011-11-14 VITALS — BP 138/71 | HR 109 | Resp 24 | Ht 62.0 in | Wt 271.0 lb

## 2011-11-14 DIAGNOSIS — O0990 Supervision of high risk pregnancy, unspecified, unspecified trimester: Secondary | ICD-10-CM

## 2011-11-14 DIAGNOSIS — O34219 Maternal care for unspecified type scar from previous cesarean delivery: Secondary | ICD-10-CM

## 2011-11-14 DIAGNOSIS — D649 Anemia, unspecified: Secondary | ICD-10-CM

## 2011-11-14 DIAGNOSIS — J45909 Unspecified asthma, uncomplicated: Secondary | ICD-10-CM

## 2011-11-14 DIAGNOSIS — O3421 Maternal care for scar from previous cesarean delivery: Secondary | ICD-10-CM

## 2011-11-14 DIAGNOSIS — O99519 Diseases of the respiratory system complicating pregnancy, unspecified trimester: Secondary | ICD-10-CM

## 2011-11-14 LAB — CBC
HCT: 32.8 % — ABNORMAL LOW (ref 36.0–46.0)
Platelets: 362 10*3/uL (ref 150–400)
RDW: 19.3 % — ABNORMAL HIGH (ref 11.5–15.5)
WBC: 15.3 10*3/uL — ABNORMAL HIGH (ref 4.0–10.5)

## 2011-11-14 LAB — TYPE AND SCREEN
ABO/RH(D): B POS
Antibody Screen: NEGATIVE

## 2011-11-14 LAB — BASIC METABOLIC PANEL WITH GFR
BUN: 7 mg/dL (ref 6–23)
CO2: 24 meq/L (ref 19–32)
Calcium: 9.4 mg/dL (ref 8.4–10.5)
Chloride: 101 meq/L (ref 96–112)
Creatinine, Ser: 0.42 mg/dL — ABNORMAL LOW (ref 0.50–1.10)
GFR calc Af Amer: >90 mL/min
GFR calc non Af Amer: >90 mL/min
Glucose, Bld: 83 mg/dL (ref 70–99)
Potassium: 3.2 meq/L — ABNORMAL LOW (ref 3.5–5.1)
Sodium: 135 meq/L (ref 135–145)

## 2011-11-14 LAB — SURGICAL PCR SCREEN
MRSA, PCR: NEGATIVE
Staphylococcus aureus: NEGATIVE

## 2011-11-14 LAB — SYPHILIS: RPR W/REFLEX TO RPR TITER AND TREPONEMAL ANTIBODIES, TRADITIONAL SCREENING AND DIAGNOSIS ALGORITHM: RPR Ser Ql: NONREACTIVE

## 2011-11-14 MED ORDER — FERUMOXYTOL INJECTION 510 MG/17 ML: 510.0000 mg | Freq: Once | INTRAVENOUS | Status: DC

## 2011-11-14 NOTE — Pre-Procedure Instructions (Signed)
Spoke with Dr Wilfred Lacy on pt condition-admit in June with exac of asthma, sleep apnea, continuous O2 usage, htn, BMI. Reviewed EKG. Previously reviewed meds with Dr Nancee Liter will take am meds and hold lasix.

## 2011-11-14 NOTE — MAU Note (Signed)
Pt discharged to home. Iron infusion not needed.  Pt never assessed per MAU RN, only per Pre-op RN.

## 2011-11-14 NOTE — MAU Note (Signed)
Cheyenne Arias, CNM verifying if pt needed iron infusion with MD's in clinic.  To notify RN if infusion is needed.

## 2011-11-14 NOTE — MAU Note (Signed)
Pt here from pre op for possible IV iron infusion prior to c/s on 11/18/11.  Orders to be verified.

## 2011-11-14 NOTE — Patient Instructions (Addendum)
   Your procedure is scheduled on: Monday August 5th  Enter through the [employer] of [facility] at: 12noon Pick up the phone at the desk and dial [phone] and inform us of your arrival.  Please call this number if you have any problems the morning of surgery: [phone]  Remember: Do not eat food after midnight: Sunday Do not drink clear liquids after: Monday at 9:30am Take these medicines the morning of surgery: Protonix, Norvasc (Blood Pressure Medicine), Lopressor (Blood Pressure Medicine), Claritin, Use nebulizers as ordered and as needed. Bring inhalers. Hold lasix morning of surgery.   Do not wear jewelry, make-up, or FINGER nail polish No metal in your hair or on your body. Do not wear lotions, powders, perfumes or deodorant. Do not shave 48 hours prior to surgery. Do not bring valuables to the hospital. Contacts, dentures or bridgework may not be worn into surgery.  Leave suitcase in the car. After Surgery it may be brought to your room. For patients being admitted to the hospital, checkout time is 11:00am the day of discharge.     Remember to use your hibiclens as instructed.Please shower with 1/2 bottle the evening before your surgery and the other 1/2 bottle the morning of surgery. Neck down avoiding private area.

## 2011-11-14 NOTE — MAU Provider Note (Addendum)
36yo AAF, [id number] at [redacted]w[redacted]d presents for outpatient iron infusion  No complaints. Scheduled for RLTCS at 39 weeks.   VSS, afebrile, NAD, on O2 by [place] for hx hypoxia Abd: gravid, NT, fetal movement palpated    Results for orders placed during the hospital encounter of 11/14/11 (from the past 24 hour(s))  CBC     Status: Abnormal   Collection Time   11/14/11 10:34 AM      Component Value Range   WBC 15.3 (*) 4.0 - 10.5 K/uL   RBC 5.02  3.87 - 5.11 MIL/uL   Hemoglobin 10.0 (*) 12.0 - 15.0 g/dL   HCT 16.1 (*) 09.6 - 04.5 %   MCV 65.3 (*) 78.0 - 100.0 fL   MCH 19.9 (*) 26.0 - 34.0 pg   MCHC 30.5  30.0 - 36.0 g/dL   RDW 40.9 (*) 81.1 - 91.4 %   Platelets 362  150 - 400 K/uL    Assessment/Plan:  High Risk Pregnancy: CHTN, Chronic Hypoxia, Obesity, OSA, Previous c/s  Discussed with patient no need for iron infusion at present secondary to increased HGB from baseline. Continue daily iron supplementation. FU in [facility] tomorrow as schedule.  SHORES,SUZANNE E.   Chart reviewed and note from hematologist appreciated who did not recommend IV iron during pregnancy. Patient's Hg stable today, advised to continue iron supplements.

## 2011-11-15 ENCOUNTER — Encounter: Payer: Self-pay | Admitting: Obstetrics & Gynecology

## 2011-11-15 ENCOUNTER — Ambulatory Visit (INDEPENDENT_AMBULATORY_CARE_PROVIDER_SITE_OTHER): Payer: Medicaid Other | Admitting: Obstetrics & Gynecology

## 2011-11-15 ENCOUNTER — Telehealth: Payer: Self-pay

## 2011-11-15 VITALS — BP 123/72 | Temp 97.8°F | Wt 273.7 lb

## 2011-11-15 DIAGNOSIS — O139 Gestational [pregnancy-induced] hypertension without significant proteinuria, unspecified trimester: Secondary | ICD-10-CM

## 2011-11-15 DIAGNOSIS — O99519 Diseases of the respiratory system complicating pregnancy, unspecified trimester: Secondary | ICD-10-CM

## 2011-11-15 DIAGNOSIS — J45909 Unspecified asthma, uncomplicated: Secondary | ICD-10-CM

## 2011-11-15 DIAGNOSIS — O09529 Supervision of elderly multigravida, unspecified trimester: Secondary | ICD-10-CM

## 2011-11-15 LAB — POCT URINALYSIS DIP (DEVICE)
Hgb urine dipstick: NEGATIVE
Leukocytes, UA: NEGATIVE
Nitrite: NEGATIVE
Protein, ur: NEGATIVE mg/dL
pH: 6 (ref 5.0–8.0)

## 2011-11-15 NOTE — Patient Instructions (Signed)
Cesarean Delivery  °Cesarean delivery is the birth of a baby through a cut (incision) in the abdomen and womb (uterus).  °LET YOUR CAREGIVER KNOW ABOUT: °· Complications involving the pregnancy.  °· Allergies.  °· Medicines taken including herbs, eyedrops, over-the-counter medicines, and creams.  °· Use of steroids (by mouth or creams).  °· Previous problems with anesthetics or numbing medicine.  °· Previous surgery.  °· History of blood clots.  °· History of bleeding or blood problems.  °· Other health problems.  °RISKS AND COMPLICATIONS  °· Bleeding.  °· Infection.  °· Blood clots.  °· Injury to surrounding organs.  °· Anesthesia problems.  °· Injury to the baby.  °BEFORE THE PROCEDURE  °· A tube (Foley catheter) will be placed in your bladder. The Foley catheter drains the urine from your bladder into a bag. This keeps your bladder empty during surgery.  °· An intravenous access tube (IV) will be placed in your arm.  °· Hair may be removed from your pubic area and your lower abdomen. This is to prevent infection in the incision site.  °· You may be given an antacid medicine to drink. This will prevent acid contents in your stomach from going into your lungs if you vomit during the surgery.  °· You may be given an antibiotic medicine to prevent infection.  °PROCEDURE  °· You may be given medicine to numb the lower half of your body (regional anesthetic). If you were in labor, you may have already had an epidural in place which can be used in both labor and cesarean delivery. You may possibly be given medicine to make you sleep (general anesthetic) though this is not as common.  °· An incision will be made in your abdomen that extends to your uterus. There are 2 basic kinds of incisions:  °· The horizontal (transverse) incision. Horizontal incisions are used for most routine cesarean deliveries.  °· The vertical (up and down) incision. This is less commonly used. This is most often reserved for women who have a  serious complication (extreme prematurity) or under emergency situations.   °· The horizontal and vertical incisions may both be used at the same time. However, this is very uncommon.  °· Your baby will then be delivered.  °AFTER THE PROCEDURE  °· If you were awake during the surgery, you will see your baby right away. If you were asleep, you will see your baby as soon as you are awake.  °· You may breastfeed your baby after surgery.  °· You may be able to get up and walk the same day as the surgery. If you need to stay in bed for a period of time, you will receive help to turn, cough, and take deep breaths after surgery. This helps prevent lung problems such as pneumonia.  °· Do not get out of bed alone the first time after surgery. You will need help getting out of bed until you are able to do this by yourself.  °· You may be able to shower the day after your cesarean delivery.  After the bandage (dressing) is taken off the incision site, a nurse will assist you to shower, if you like.   °· You will have pneumatic compressing hose placed on your feet or lower legs. These hose are used to prevent blood clots. When you are up and walking regularly, they will no longer be necessary.   °· Do not cross your legs when you sit.  °· Save any blood clots that you   pass. If you pass a clot while on the toilet, do not flush it. Call for the nurse. Tell the nurse if you think you are bleeding too much or passing too many clots.  °· Start drinking liquids and eating food as directed by your caregiver. If your stomach is not ready, drinking and eating too soon can cause an increase in bloating and swelling of your intestine and abdomen. This is very uncomfortable.  °· You will be given medicine as needed. Let your caregivers know if you are hurting. They want you to be comfortable. You may also be given an antibiotic to prevent an infection.  °· Your IV will be taken out when you are drinking a reasonable amount of fluids. The  Foley catheter is taken out when you are up and walking.  °· If your blood type is Rh negative and your baby's blood type is Rh positive, you will be given a shot of anti-D immune globulin. This shot prevents you from having Rh problems with a future pregnancy. You should get the shot even if you had your tubes tied (tubal ligation).  °· If you are allowed to take the baby for a walk, place the baby in the bassinet and push it. Do not carry your baby in your arms.  °Document Released: 04/02/2005 Document Revised: 03/22/2011 Document Reviewed: 07/28/2010 °[employer]® Patient Information ©2012 [employer]. °

## 2011-11-15 NOTE — Progress Notes (Signed)
Occasional UC, pelvic pressure. Had some spotting. On prednisone taper. NST reactive EDC is 8/21-24 but need [place] report from [facility] 04/25/11. Cancel scheduled CS 8/5

## 2011-11-15 NOTE — Progress Notes (Signed)
States had some vaginal bleeding on 11/10/11-states was some in toilet and some when wiped- states was bright red, then saw a little bit of mucous with tinge of red that night, then a little stopped and then it stopped. States has headache sometimes,, but has migraines anyways and hasn't noticed if these headaches different. States sometimes has visual changes. Has had 4lb weight gain.

## 2011-11-15 NOTE — Telephone Encounter (Signed)
[facility] called and requested early U/S reports that was done in12/2012 and 04/2011. Reports were faxed to [facility]. Pt was referred there by Dr. Ernestina Patches. Mathis Bud

## 2011-11-15 NOTE — MAU Provider Note (Signed)
Agree with above note.  Cheyenne Arias 11/15/2011 9:51 AM

## 2011-11-15 NOTE — Progress Notes (Signed)
BPP scheduled Mon.November 19, 2011 at 12noon in [facility]. [facility] called and they will fax the patient's U/S report(printed)from January. OBGYN office at [facility] advised to cancel scheduled C/S for August 5th per Dr. Debroah Loop.

## 2011-11-16 ENCOUNTER — Other Ambulatory Visit: Payer: Self-pay | Admitting: Obstetrics & Gynecology

## 2011-11-16 DIAGNOSIS — O9989 Other specified diseases and conditions complicating pregnancy, childbirth and the puerperium: Secondary | ICD-10-CM

## 2011-11-16 DIAGNOSIS — O34219 Maternal care for unspecified type scar from previous cesarean delivery: Secondary | ICD-10-CM

## 2011-11-16 DIAGNOSIS — O09529 Supervision of elderly multigravida, unspecified trimester: Secondary | ICD-10-CM

## 2011-11-16 DIAGNOSIS — O10019 Pre-existing essential hypertension complicating pregnancy, unspecified trimester: Secondary | ICD-10-CM

## 2011-11-16 DIAGNOSIS — O3410 Maternal care for benign tumor of corpus uteri, unspecified trimester: Secondary | ICD-10-CM

## 2011-11-18 ENCOUNTER — Emergency Department (HOSPITAL_BASED_OUTPATIENT_CLINIC_OR_DEPARTMENT_OTHER)
Admission: EM | Admit: 2011-11-18 | Discharge: 2011-11-18 | Disposition: A | Discharge status: Home / Self Care | Payer: Medicaid Other | Attending: Emergency Medicine | Admitting: Emergency Medicine

## 2011-11-18 ENCOUNTER — Encounter (HOSPITAL_BASED_OUTPATIENT_CLINIC_OR_DEPARTMENT_OTHER): Payer: Self-pay | Admitting: *Deleted

## 2011-11-18 DIAGNOSIS — E669 Obesity, unspecified: Secondary | ICD-10-CM | POA: Insufficient documentation

## 2011-11-18 DIAGNOSIS — Z9981 Dependence on supplemental oxygen: Secondary | ICD-10-CM | POA: Insufficient documentation

## 2011-11-18 DIAGNOSIS — O99340 Other mental disorders complicating pregnancy, unspecified trimester: Secondary | ICD-10-CM | POA: Insufficient documentation

## 2011-11-18 DIAGNOSIS — O9989 Other specified diseases and conditions complicating pregnancy, childbirth and the puerperium: Secondary | ICD-10-CM | POA: Insufficient documentation

## 2011-11-18 DIAGNOSIS — F329 Major depressive disorder, single episode, unspecified: Secondary | ICD-10-CM | POA: Insufficient documentation

## 2011-11-18 DIAGNOSIS — F411 Generalized anxiety disorder: Secondary | ICD-10-CM | POA: Insufficient documentation

## 2011-11-18 DIAGNOSIS — O169 Unspecified maternal hypertension, unspecified trimester: Secondary | ICD-10-CM | POA: Insufficient documentation

## 2011-11-18 DIAGNOSIS — F3289 Other specified depressive episodes: Secondary | ICD-10-CM | POA: Insufficient documentation

## 2011-11-18 DIAGNOSIS — B86 Scabies: Secondary | ICD-10-CM | POA: Insufficient documentation

## 2011-11-18 DIAGNOSIS — Z6841 Body Mass Index (BMI) 40.0 and over, adult: Secondary | ICD-10-CM | POA: Insufficient documentation

## 2011-11-18 MED ORDER — PERMETHRIN 5 % EX CREA: TOPICAL_CREAM | CUTANEOUS | Status: AC

## 2011-11-18 NOTE — ED Notes (Addendum)
Pt. C/o general rash  that started approx 2 weeks ago. States the rash itches. Denies any new furniture/pets/. States she has used a new soap. Pinpoint vesicle type rash noted generally to pt's body. Pt. Wears home 02. resp even and slightly labored on arrival. Pt. States this is her norm.

## 2011-11-18 NOTE — ED Provider Notes (Signed)
History     CSN: [id number]  Arrival date & time 11/18/11  0418   None     Chief Complaint  Patient presents with  . Rash    (Consider location/radiation/quality/duration/timing/severity/associated sxs/prior treatment) HPI Comments: Has been on multiple rounds of steroids which will initially help with the itching but has not changed the rash.  Patient is a 36 y.o. female presenting with rash. The history is provided by the patient.  Rash  This is a new problem. Episode onset: 1.5 months ago.  started while in hospital and has gotten worse since being at home. The problem has been gradually worsening. The problem is associated with an unknown factor. There has been no fever. Affected Location: diffuse also on hands and in finger webs and groin. The pain is at a severity of 0/10. The patient is experiencing no pain. The pain has been constant since onset. Associated symptoms include itching. She has tried OTC analgesics, steriods and antihistamines for the symptoms. The treatment provided mild relief.    Past Medical History  Diagnosis Date  . Hypertension   . Migraine   . Anxiety   . Abnormal Pap smear   . Anemia   . Fibroids   . Fibromyalgia     degenerative disc disease  . Depression   . Sleep apnea   . Shortness of breath   . On home oxygen therapy     2 liters [place]  . PONV (postoperative nausea and vomiting)   . Low iron     pt states low iron-for Feraheme today in MAU  . Morbid obesity with BMI of 45.0-49.9, adult   . Asthma     recent admission with exacerbation of asthma 09/15/11    Past Surgical History  Procedure Date  . Wisdom tooth extraction   . Mandible surgery 2008  . Carpal tunnel release   . Cesarean section     1994, 1996     Family History  Problem Relation Age of Onset  . Sickle cell trait Maternal Aunt   . Sickle cell anemia Other     History  Substance Use Topics  . Smoking status: Never Smoker   . Smokeless tobacco: Never Used  .  Alcohol Use: No    OB History    Grav Para Term Preterm Abortions TAB SAB Ect Mult Living   3 2 2       2       Review of Systems  Unable to perform ROS Respiratory: Negative for cough and shortness of breath.   Genitourinary: Negative for dysuria.  Skin: Positive for itching and rash.  All other systems reviewed and are negative.    Allergies  Latex and Morphine and related  Home Medications   Current Outpatient Rx  Name Route Sig Dispense Refill  . ALBUTEROL SULFATE HFA 108 (90 BASE) MCG/ACT IN AERS Inhalation Inhale 2 puffs into the lungs every 6 (six) hours as needed. As needed for asthma/shortness of breath    . AMLODIPINE BESYLATE 10 MG PO TABS Oral Take 1 tablet (10 mg total) by mouth daily. 31 tablet 6  . BUDESONIDE 0.5 MG/2ML IN SUSP Nebulization Take 2 mLs (0.5 mg total) by nebulization 2 (two) times daily. 60 mL 3  . CYCLOBENZAPRINE HCL 10 MG PO TABS Oral Take 10 mg by mouth 3 (three) times daily as needed.    [name] FIRST-MARYS MOUTHWASH MT SUSP Mouth/Throat Use as directed 15 mLs in the mouth or throat 3 (three) times  daily. 1 Bottle 0  . DIPHENHYDRAMINE-APAP (SLEEP) 25-500 MG PO TABS Oral Take 1 tablet by mouth at bedtime as needed. Takes for sleep    . FLUTICASONE PROPIONATE 50 MCG/ACT NA SUSP Nasal Place 2 sprays into the nose 2 (two) times daily. 16 g 12  . FUROSEMIDE 40 MG PO TABS Oral Take 1 tablet (40 mg total) by mouth daily. 10 tablet 1  . IPRATROPIUM-ALBUTEROL 0.5-2.5 (3) MG/3ML IN SOLN Nebulization Take 3 mLs by nebulization every 4 (four) hours as needed.    [name] LORATADINE 10 MG PO TABS Oral Take 1 tablet (10 mg total) by mouth daily. 31 tablet 12  . METOPROLOL TARTRATE 25 MG PO TABS Oral Take 1 tablet (25 mg total) by mouth 2 (two) times daily. 360 tablet 6  . OXYCODONE-ACETAMINOPHEN 5-325 MG PO TABS Oral Take 3 tablets by mouth every 8 (eight) hours as needed.    [name] PANTOPRAZOLE SODIUM 40 MG PO TBEC Oral Take 1 tablet (40 mg total) by mouth daily. 31 tablet 6    . POTASSIUM CHLORIDE CRYS ER 20 MEQ PO TBCR Oral Take 1 tablet (20 mEq total) by mouth daily. 10 tablet 1  . PREDNISONE 10 MG PO TABS Oral Take 10 mg by mouth daily. Take with food 4 tabs daily x 4 days, 3 tabs daily x 4 days, 2 tabs daily x 4 days, 1 tab daily x 4 days then stop    . PRENATAL MULTIVITAMIN CH Oral Take 1 tablet by mouth every morning.      LMP 02/21/2011  Physical Exam  Nursing note and vitals reviewed. Constitutional: She is oriented to person, place, and time. She appears well-developed and well-nourished. No distress.  HENT:  Head: Normocephalic and atraumatic.  Eyes: EOM are normal. Pupils are equal, round, and reactive to light.  Neck: Normal range of motion.  Cardiovascular: Tachycardia present.   Pulmonary/Chest:       Wearing nasal canula.    Abdominal:       Gravid to the xyphoid  Musculoskeletal: She exhibits no edema and no tenderness.  Neurological: She is alert and oriented to person, place, and time.  Skin: Skin is warm, dry and intact. Rash noted. Rash is maculopapular.       Rash with excoriation over the upper and lower ext as well as the chest or back.  Rash involves the finger webs and hands    ED Course  Procedures (including critical care time)  Labs Reviewed - No data to display No results found.   1. Scabies       MDM   Pt with rash that she developed while in the hospital which has only worsened.  Trying prednisone only makes mild improvement in the itching but rash has not changed.  Pt denies any infectious sx.  Unlikely to be pup's rash of pregnancy due to distribution.  Rash has appearance of scabies or bed bugs but noone else in the home is itching however pt has not tried permethrin so will try that.  Scheduled c-section next week.  If pregnancy related then should improve with delivery if not pt is still trying to get into her dermatologist.  Pt denies any SOB or cough today.  Wearing home O2 and sating 100%.  Pt recently  finished a steroid taper yesterday and do not want to put her back on steroids.  Will have her continue benadryl.        Gwyneth Sprout, MD 11/18/11 [phone]

## 2011-11-19 ENCOUNTER — Encounter: Payer: Medicaid Other | Admitting: Family Medicine

## 2011-11-19 ENCOUNTER — Telehealth: Payer: Self-pay | Admitting: *Deleted

## 2011-11-19 ENCOUNTER — Encounter (HOSPITAL_COMMUNITY): Payer: Self-pay

## 2011-11-19 ENCOUNTER — Encounter (HOSPITAL_COMMUNITY): Admission: RE | Payer: Self-pay | Source: Ambulatory Visit

## 2011-11-19 ENCOUNTER — Ambulatory Visit (HOSPITAL_COMMUNITY)
Admission: RE | Admit: 2011-11-19 | Discharge: 2011-11-19 | Disposition: A | Discharge status: Home / Self Care | Payer: Medicaid Other | Source: Ambulatory Visit | Attending: Obstetrics & Gynecology | Admitting: Obstetrics & Gynecology

## 2011-11-19 VITALS — BP 111/72 | HR 120 | Wt 278.5 lb

## 2011-11-19 DIAGNOSIS — Z538 Procedure and treatment not carried out for other reasons: Secondary | ICD-10-CM | POA: Insufficient documentation

## 2011-11-19 DIAGNOSIS — O0990 Supervision of high risk pregnancy, unspecified, unspecified trimester: Secondary | ICD-10-CM

## 2011-11-19 DIAGNOSIS — O99519 Diseases of the respiratory system complicating pregnancy, unspecified trimester: Secondary | ICD-10-CM

## 2011-11-19 DIAGNOSIS — J45909 Unspecified asthma, uncomplicated: Secondary | ICD-10-CM

## 2011-11-19 DIAGNOSIS — O34219 Maternal care for unspecified type scar from previous cesarean delivery: Secondary | ICD-10-CM

## 2011-11-19 DIAGNOSIS — O3410 Maternal care for benign tumor of corpus uteri, unspecified trimester: Secondary | ICD-10-CM

## 2011-11-19 DIAGNOSIS — O99891 Other specified diseases and conditions complicating pregnancy: Secondary | ICD-10-CM | POA: Insufficient documentation

## 2011-11-19 DIAGNOSIS — O10019 Pre-existing essential hypertension complicating pregnancy, unspecified trimester: Secondary | ICD-10-CM

## 2011-11-19 DIAGNOSIS — O09529 Supervision of elderly multigravida, unspecified trimester: Secondary | ICD-10-CM

## 2011-11-19 DIAGNOSIS — O9989 Other specified diseases and conditions complicating pregnancy, childbirth and the puerperium: Secondary | ICD-10-CM

## 2011-11-19 SURGERY — Surgical Case
Anesthesia: Regional | Site: Abdomen

## 2011-11-19 NOTE — Telephone Encounter (Signed)
Patient called because she is concerned that Dr. Debroah Loop changed her due date. She was originally scheduled to have a csection today but is now having to wait until 2 weeks from now. She says that she knows the baby is okay per recent ultrasound but has concerns about her own health due to her many medical conditions.

## 2011-11-19 NOTE — Telephone Encounter (Signed)
Called patient back left her a message that we are returning her call.

## 2011-11-19 NOTE — Progress Notes (Signed)
Patient seen for follow up BPP.  See report in [facility].  Cheyenne Gula, MD  Single living intrauterine pregnancy at 37 weeks 2 days by early outside ultrasound. The fetus is active with a BPP of 8/8 Normal amniotic fluid volume  Continue 2x weekly testing- weekly BPPs at [facility] and NSTs in clinic.   Recommend delivery at 39 weeks.

## 2011-11-20 NOTE — Telephone Encounter (Signed)
Pt returned call to clinic requesting date and time of C-section.

## 2011-11-21 NOTE — Telephone Encounter (Signed)
Called and spoke w/pt. We discussed the dating criteria used to arrive @ her due date of 12/08/11- earliest US performed @ 7w 3d by [facility] which is most accurate. Her delivery needs to be @ 39wks unless she has spontaneous labor before that time. This is agreed upon by MFM.   I informed her of surgery scheduled on 12/03/11 @ 1300.  Pt expressed unhappiness with this plan and stated that if her due date is 12/08/11 she will be almost 40 wks at the time of C/section. I explained that because the 17th is a Saturday, the best plan of care for her is to deliver on Monday 8/19- she will be 39w 2d EGA which is still considered "39 wks". She will not be 40 wks until 12/08/11.  I encouraged pt to discuss further with MD @ her appt tomorrow in order to gain a better understanding.  Pt voiced understanding.

## 2011-11-21 NOTE — Telephone Encounter (Signed)
Called and left message that I am calling with the information you requested. Please call back and ask to speak to me.

## 2011-11-22 ENCOUNTER — Telehealth: Payer: Self-pay | Admitting: Obstetrics & Gynecology

## 2011-11-22 ENCOUNTER — Other Ambulatory Visit: Payer: Medicaid Other

## 2011-11-22 NOTE — Telephone Encounter (Signed)
Called patient to inform her her she had missed her appointment. Patient then stated I know, and I am not making another appointment until somebody listens to me. I will not have my health dictated to me. I should be able to have some say so about my health. Patient stated the doctors wanted to do a cesarean on 12/03/11, because the weekend is short handed. Patient stated she could have this done on the 14th,15th, or even the 16th. So I am no coming back until the doctor, nurse calls me, and listen to what I have to say.

## 2011-11-22 NOTE — Telephone Encounter (Signed)
Called and left message on answering machine.  Will talk with patient Monday at next appt.  Office closing for the day today.

## 2011-11-23 NOTE — Telephone Encounter (Addendum)
Called and spoke w/pt. I informed her that Dr. Penne Lash had tried to contact her yesterday and left a messgae. Pt was aware. I stated that we would like to see her in clinic on 8/12 after her [place] @ MFM. Dr. Penne Lash will talk to her at that time about her impending surgery and discuss any concerns that she may have. Pt agreed and voiced understanding. She will come to clinic immediately following her appt @ [facility] on 11/26/11.  Pt endorses normal FM daily.  Pt had no further questions.

## 2011-11-26 ENCOUNTER — Ambulatory Visit: Payer: Medicaid Other | Admitting: Obstetrics & Gynecology

## 2011-11-26 ENCOUNTER — Inpatient Hospital Stay (HOSPITAL_COMMUNITY)
Admission: AD | Admit: 2011-11-26 | Discharge: 2011-12-01 | DRG: 765 | Disposition: A | Discharge status: Home / Self Care | Payer: Medicaid Other | Source: Ambulatory Visit | Attending: Obstetrics and Gynecology | Admitting: Obstetrics and Gynecology

## 2011-11-26 ENCOUNTER — Encounter (HOSPITAL_COMMUNITY): Payer: Self-pay | Admitting: *Deleted

## 2011-11-26 ENCOUNTER — Ambulatory Visit (HOSPITAL_COMMUNITY)
Admission: RE | Admit: 2011-11-26 | Discharge: 2011-11-26 | Disposition: A | Discharge status: Home / Self Care | Payer: Medicaid Other | Source: Ambulatory Visit | Attending: Obstetrics & Gynecology | Admitting: Obstetrics & Gynecology

## 2011-11-26 VITALS — BP 146/87 | Temp 99.5°F | Wt 280.1 lb

## 2011-11-26 VITALS — BP 122/70 | HR 122 | Wt 283.5 lb

## 2011-11-26 DIAGNOSIS — O09529 Supervision of elderly multigravida, unspecified trimester: Secondary | ICD-10-CM | POA: Diagnosis present

## 2011-11-26 DIAGNOSIS — O34219 Maternal care for unspecified type scar from previous cesarean delivery: Principal | ICD-10-CM | POA: Diagnosis present

## 2011-11-26 DIAGNOSIS — D509 Iron deficiency anemia, unspecified: Secondary | ICD-10-CM | POA: Diagnosis present

## 2011-11-26 DIAGNOSIS — J45909 Unspecified asthma, uncomplicated: Secondary | ICD-10-CM

## 2011-11-26 DIAGNOSIS — D649 Anemia, unspecified: Secondary | ICD-10-CM

## 2011-11-26 DIAGNOSIS — O1002 Pre-existing essential hypertension complicating childbirth: Secondary | ICD-10-CM | POA: Diagnosis present

## 2011-11-26 DIAGNOSIS — E669 Obesity, unspecified: Secondary | ICD-10-CM | POA: Insufficient documentation

## 2011-11-26 DIAGNOSIS — G4733 Obstructive sleep apnea (adult) (pediatric): Secondary | ICD-10-CM

## 2011-11-26 DIAGNOSIS — J45901 Unspecified asthma with (acute) exacerbation: Secondary | ICD-10-CM | POA: Diagnosis present

## 2011-11-26 DIAGNOSIS — O99519 Diseases of the respiratory system complicating pregnancy, unspecified trimester: Secondary | ICD-10-CM

## 2011-11-26 DIAGNOSIS — O0990 Supervision of high risk pregnancy, unspecified, unspecified trimester: Secondary | ICD-10-CM

## 2011-11-26 DIAGNOSIS — O10019 Pre-existing essential hypertension complicating pregnancy, unspecified trimester: Secondary | ICD-10-CM | POA: Insufficient documentation

## 2011-11-26 DIAGNOSIS — O9902 Anemia complicating childbirth: Secondary | ICD-10-CM | POA: Diagnosis present

## 2011-11-26 DIAGNOSIS — O99892 Other specified diseases and conditions complicating childbirth: Secondary | ICD-10-CM | POA: Diagnosis present

## 2011-11-26 DIAGNOSIS — O169 Unspecified maternal hypertension, unspecified trimester: Secondary | ICD-10-CM

## 2011-11-26 DIAGNOSIS — I10 Essential (primary) hypertension: Secondary | ICD-10-CM

## 2011-11-26 LAB — CBC WITH DIFFERENTIAL/PLATELET
Eosinophils Absolute: 1.3 10*3/uL — ABNORMAL HIGH (ref 0.0–0.7)
Hemoglobin: 10.6 g/dL — ABNORMAL LOW (ref 12.0–15.0)
Lymphocytes Relative: 10 % — ABNORMAL LOW (ref 12–46)
Lymphs Abs: 1.3 10*3/uL (ref 0.7–4.0)
Monocytes Relative: 5 % (ref 3–12)
Neutro Abs: 10.3 10*3/uL — ABNORMAL HIGH (ref 1.7–7.7)
Neutrophils Relative %: 76 % (ref 43–77)
Platelets: 361 10*3/uL (ref 150–400)
RBC: 5.39 MIL/uL — ABNORMAL HIGH (ref 3.87–5.11)
WBC: 13.6 10*3/uL — ABNORMAL HIGH (ref 4.0–10.5)

## 2011-11-26 LAB — BASIC METABOLIC PANEL
BUN: 5 mg/dL — ABNORMAL LOW (ref 6–23)
CO2: 24 mEq/L (ref 19–32)
Chloride: 100 mEq/L (ref 96–112)
GFR calc non Af Amer: >90 mL/min (ref >90)
Glucose, Bld: 99 mg/dL (ref 70–99)
Potassium: 3.8 mEq/L (ref 3.5–5.1)
Sodium: 135 mEq/L (ref 135–145)

## 2011-11-26 MED ORDER — ALBUTEROL SULFATE (5 MG/ML) 0.5% IN NEBU
2.5000 mg | INHALATION_SOLUTION | RESPIRATORY_TRACT | Status: DC | PRN
  Administered 2011-11-26: 2.5 mg via RESPIRATORY_TRACT
  Filled 2011-11-26 (×3): qty 0.5

## 2011-11-26 MED ORDER — CYCLOBENZAPRINE HCL 10 MG PO TABS
10.0000 mg | ORAL_TABLET | Freq: Three times a day (TID) | ORAL | Status: DC | PRN
  Administered 2011-11-26 – 2011-12-01 (×9): 10 mg via ORAL
  Filled 2011-11-26 (×8): qty 1

## 2011-11-26 MED ORDER — LACTATED RINGERS IV SOLN
INTRAVENOUS | Status: DC
  Administered 2011-11-26: 16:00:00 via INTRAVENOUS

## 2011-11-26 MED ORDER — PANTOPRAZOLE SODIUM 40 MG PO TBEC
40.0000 mg | DELAYED_RELEASE_TABLET | Freq: Two times a day (BID) | ORAL | Status: DC
  Administered 2011-11-26 – 2011-12-01 (×8): 40 mg via ORAL
  Filled 2011-11-26 (×12): qty 1

## 2011-11-26 MED ORDER — POTASSIUM CHLORIDE CRYS ER 20 MEQ PO TBCR
20.0000 meq | EXTENDED_RELEASE_TABLET | Freq: Every day | ORAL | Status: DC
  Administered 2011-11-27 – 2011-12-01 (×4): 20 meq via ORAL
  Filled 2011-11-26 (×7): qty 1

## 2011-11-26 MED ORDER — DOCUSATE SODIUM 100 MG PO CAPS
100.0000 mg | ORAL_CAPSULE | Freq: Every day | ORAL | Status: DC
  Administered 2011-11-26 – 2011-11-27 (×2): 100 mg via ORAL
  Filled 2011-11-26 (×2): qty 1

## 2011-11-26 MED ORDER — ALBUTEROL SULFATE (5 MG/ML) 0.5% IN NEBU
2.5000 mg | INHALATION_SOLUTION | RESPIRATORY_TRACT | Status: DC
  Administered 2011-11-26 – 2011-11-27 (×5): 2.5 mg via RESPIRATORY_TRACT
  Filled 2011-11-26 (×7): qty 0.5

## 2011-11-26 MED ORDER — BISOPROLOL FUMARATE 5 MG PO TABS
2.5000 mg | ORAL_TABLET | Freq: Every day | ORAL | Status: DC
  Administered 2011-11-26 – 2011-11-30 (×5): 2.5 mg via ORAL
  Filled 2011-11-26 (×7): qty 0.5

## 2011-11-26 MED ORDER — CALCIUM CARBONATE ANTACID 500 MG PO CHEW
2.0000 | CHEWABLE_TABLET | ORAL | Status: DC | PRN
  Filled 2011-11-26: qty 2

## 2011-11-26 MED ORDER — OXYCODONE-ACETAMINOPHEN 5-325 MG PO TABS
3.0000 | ORAL_TABLET | Freq: Three times a day (TID) | ORAL | Status: DC | PRN
  Filled 2011-11-26: qty 3

## 2011-11-26 MED ORDER — PRENATAL MULTIVITAMIN CH
1.0000 | ORAL_TABLET | Freq: Every day | ORAL | Status: DC
  Administered 2011-11-26 – 2011-11-27 (×2): 1 via ORAL
  Filled 2011-11-26 (×3): qty 1

## 2011-11-26 MED ORDER — ACETAMINOPHEN 325 MG PO TABS: 650.0000 mg | ORAL_TABLET | ORAL | Status: DC | PRN

## 2011-11-26 MED ORDER — AMLODIPINE BESYLATE 10 MG PO TABS
10.0000 mg | ORAL_TABLET | Freq: Every day | ORAL | Status: DC
  Administered 2011-11-27 – 2011-12-01 (×5): 10 mg via ORAL
  Filled 2011-11-26 (×6): qty 1

## 2011-11-26 MED ORDER — OXYCODONE-ACETAMINOPHEN 5-325 MG PO TABS
1.0000 | ORAL_TABLET | Freq: Three times a day (TID) | ORAL | Status: DC | PRN
  Administered 2011-11-26: 1 via ORAL
  Administered 2011-11-26 – 2011-11-28 (×3): 2 via ORAL
  Filled 2011-11-26 (×4): qty 2
  Filled 2011-11-26: qty 1

## 2011-11-26 MED ORDER — BUDESONIDE 0.5 MG/2ML IN SUSP
0.5000 mg | Freq: Two times a day (BID) | RESPIRATORY_TRACT | Status: DC
  Administered 2011-11-26 – 2011-12-01 (×11): 0.5 mg via RESPIRATORY_TRACT
  Filled 2011-11-26 (×13): qty 2

## 2011-11-26 MED ORDER — SODIUM CHLORIDE 0.9 % IV SOLN
INTRAVENOUS | Status: DC
  Administered 2011-11-26: 20 mL via INTRAVENOUS

## 2011-11-26 MED ORDER — FUROSEMIDE 40 MG PO TABS
40.0000 mg | ORAL_TABLET | Freq: Every day | ORAL | Status: DC
  Administered 2011-11-27 – 2011-12-01 (×5): 40 mg via ORAL
  Filled 2011-11-26 (×7): qty 1

## 2011-11-26 MED ORDER — LORATADINE 10 MG PO TABS
10.0000 mg | ORAL_TABLET | Freq: Every day | ORAL | Status: DC
  Administered 2011-11-27 – 2011-12-01 (×4): 10 mg via ORAL
  Filled 2011-11-26 (×6): qty 1

## 2011-11-26 MED ORDER — METHYLPREDNISOLONE SODIUM SUCC 125 MG IJ SOLR
80.0000 mg | Freq: Three times a day (TID) | INTRAMUSCULAR | Status: DC
  Administered 2011-11-26 – 2011-11-29 (×8): 80 mg via INTRAVENOUS
  Filled 2011-11-26 (×10): qty 1.28

## 2011-11-26 MED ORDER — ZOLPIDEM TARTRATE 5 MG PO TABS
5.0000 mg | ORAL_TABLET | Freq: Every evening | ORAL | Status: DC | PRN
  Administered 2011-11-26 – 2011-11-28 (×2): 5 mg via ORAL
  Filled 2011-11-26 (×2): qty 1

## 2011-11-26 NOTE — Progress Notes (Signed)
P= 120 C/o pain/pressure in pelvic area

## 2011-11-26 NOTE — MAU Note (Signed)
Wheezing past week, finished prednisone last wk.    Took meds this morning.  Had appt at [facility] and downstairs at clinic, was told to come up here.

## 2011-11-26 NOTE — H&P (Signed)
Cheyenne Arias is a 36 y.o. female presenting from clinic for treatment of asthma exacerbation.  Maternal Medical History:    Patient was seen in clinic this morning and noticed to be wheezing and having difficulty breathing.  She was then admitted to antenatal for treatment of asthma exacerbation.  Patient finished a steroid taper and course of amoxacillin last week for respiratory infection and was managed by Dr. Craige Cotta for this.  States since she came off of the steroids and antibiotics she has had increasing difficulty breathing and cough over the past week.  She has a productive cough with yellow/green mucous. States she has had some low grade fevers, but nothing over 100F.  She denies chest pain.  She takes ipratropium and pulmicort daily and states these have not helped over the past week.  She is on 2L O2 at home all the time and uses a CPAP for her sleep apnea. Currently oxygenating well at 100% on 2L.  OB History    Grav Para Term Preterm Abortions TAB SAB Ect Mult Living   3 2 2       2      Past Medical History  Diagnosis Date  . Hypertension   . Migraine   . Anxiety   . Abnormal Pap smear   . Anemia   . Fibroids   . Fibromyalgia     degenerative disc disease  . Depression   . Sleep apnea   . Shortness of breath   . On home oxygen therapy     2 liters [place]  . PONV (postoperative nausea and vomiting)   . Low iron     pt states low iron-for Feraheme today in MAU  . Morbid obesity with BMI of 45.0-49.9, adult   . Asthma     recent admission with exacerbation of asthma 09/15/11   Past Surgical History  Procedure Date  . Wisdom tooth extraction   . Mandible surgery 2008  . Carpal tunnel release   . Cesarean section     1994, 1996    Family History: family history includes Sickle cell anemia in her other and Sickle cell trait in her maternal aunt. Social History:  reports that she has never smoked. She has never used smokeless tobacco. She reports that she does not drink  alcohol or use illicit drugs.  Review of Systems  Constitutional: Positive for fever. Negative for chills and weight loss.  Eyes: Negative for blurred vision and double vision.       Endorses floaters  Respiratory: Positive for cough, sputum production, shortness of breath and wheezing.   Cardiovascular: Negative for chest pain.  Gastrointestinal: Negative for vomiting and abdominal pain.  Genitourinary: Negative for dysuria and urgency.  Skin: Negative for rash.  Neurological: Positive for headaches. Negative for dizziness.      Blood pressure 124/65, pulse 107, temperature 99 F (37.2 C), temperature source Oral, resp. rate 24, height 5\' 2"  (1.575 m), weight 124.15 kg (273 lb 11.2 oz), last menstrual period 02/21/2011, SpO2 100.00%. Exam Physical Exam  Constitutional: She is oriented to person, place, and time. She appears well-developed and well-nourished.  HENT:  Head: Normocephalic and atraumatic.  Cardiovascular: Normal rate, regular rhythm and normal heart sounds.   Respiratory: Effort normal. She has wheezes (inspiratory and expiratory). She has no rales.  GI: Soft. There is no tenderness.       Obese, gravid abdomen  Musculoskeletal: She exhibits edema (lower extremity).  Neurological: She is alert and oriented to  person, place, and time.  Skin: Skin is warm and dry.  Psychiatric: She has a normal mood and affect.    Prenatal labs: ABO, Rh: --/--/B POS (07/31 1035) Antibody: NEG (07/31 1032) Rubella: Nonimmune (02/05 0000) RPR: NON REACTIVE (07/31 1034)  HBsAg: NEGATIVE (09/19 1425)  HIV: Non-reactive (02/05 0000)  GBS:     Assessment/Plan: Patient is a G3P2002 at 38.2 EGA who presents with asthma exacerbation.  Admit to antenatal. Consult pulmonology, Dr. Craige Cotta with [facility]. Consult MFM. Nebulizer treatments q4hrs with q2hr prns treatments.  CPAP and O2 at 2L.  Marikay Alar 11/26/2011, 1:18 PM  I have seen and examined pt and reviewed prenatal records.  I agree with above. Plan is to optimize pt's respiratory status in order to deliver by c-section as soon as possible, as respiratory problems have worsened since 2nd trimester. Will hold on MFM consult.   Napoleon Form, MD

## 2011-11-26 NOTE — Progress Notes (Signed)
Rpt BP manual=130/80.  Pt having SOB worse than last visit.  Bilateral wheezes.  Pt c/o productive cough and feels like infection is coming back.  Pt needs to be admitted for respiratory treatment and possibel steroids.  Given pt's worsening state, would optimize and schedule surgery ASAP.  Will have MFM weigh in.

## 2011-11-26 NOTE — Consult Note (Signed)
Name: Cheyenne Arias MRN: [id number] DOB: 10-05-1975    LOS: 0  Referring Provider:  Dr Thad Ranger Reason for Referral:  Asthma  PULMONARY / CRITICAL CARE MEDICINE  HPI:  15 yobf never smoker with longstanding osa now 84 week's pregnant with third delivery imminent and h/o dtc asthma during second trimester and requiring 24 h 02 and nebs but only  Improves back to baseline on prednisone.  Now admitted to [facility] for elective c section and asked by OB service to tune up for gen anesthesia  Pt says worse sob x one week since last pred rx complete with chest tightness and subj wheeze but  No unusual cough, purulent sputum or sinus/hb symptoms on present rx.  ROS  The following are not active complaints unless bolded sore throat, dysphagia, dental problems, itching, sneezing,  nasal congestion or excess/ purulent secretions, ear ache,   fever, chills, sweats, unintended wt loss, pleuritic or exertional cp, hemoptysis,  orthopnea pnd or leg swelling, presyncope, palpitations, heartburn, abdominal pain, anorexia, nausea, vomiting, diarrhea  or change in bowel or urinary habits, change in stools or urine, dysuria,hematuria,  rash, arthralgias, visual complaints, headache, numbness weakness or ataxia or problems with walking or coordination,  change in mood/affect or memory.          Past Medical History  Diagnosis Date  . Hypertension   . Migraine   . Anxiety   . Abnormal Pap smear   . Anemia   . Fibroids   . Fibromyalgia     degenerative disc disease  . Depression   . Sleep apnea   . Shortness of breath   . On home oxygen therapy     2 liters [place]  . PONV (postoperative nausea and vomiting)   . Low iron     pt states low iron-for Feraheme today in MAU  . Morbid obesity with BMI of 45.0-49.9, adult   . Asthma     recent admission with exacerbation of asthma 09/15/11   Past Surgical History  Procedure Date  . Wisdom tooth extraction   . Mandible surgery 2008  . Carpal tunnel release   .  Cesarean section     1994, 1996    Prior to Admission medications   Medication Sig Start Date End Date Taking? Authorizing Provider  albuterol (PROVENTIL HFA;VENTOLIN HFA) 108 (90 BASE) MCG/ACT inhaler Inhale 2 puffs into the lungs every 6 (six) hours as needed. As needed for asthma/shortness of breath   Yes Historical Provider, MD  amLODipine (NORVASC) 10 MG tablet Take 1 tablet (10 mg total) by mouth daily. 09/28/11 09/27/12 Yes Myra C Marice Potter, MD  budesonide (PULMICORT) 0.5 MG/2ML nebulizer solution Take 2 mLs (0.5 mg total) by nebulization 2 (two) times daily. 11/09/11 11/08/12 Yes [name], MD  cyclobenzaprine (FLEXERIL) 10 MG tablet Take 10 mg by mouth 3 (three) times daily as needed.   Yes Historical Provider, MD  Diphenhyd-HC-Nystatin-Tetracyc (FIRST-MARYS MOUTHWASH) SUSP Use as directed 15 mLs in the mouth or throat 3 (three) times daily. 10/04/11  Yes Peggy Constant, MD  diphenhydramine-acetaminophen (TYLENOL PM) 25-500 MG TABS Take 1 tablet by mouth at bedtime as needed. Takes for sleep   Yes Historical Provider, MD  fluticasone (FLONASE) 50 MCG/ACT nasal spray Place 2 sprays into the nose 2 (two) times daily. 09/28/11 09/27/12 Yes Myra Inda Coke, MD  furosemide (LASIX) 40 MG tablet Take 1 tablet (40 mg total) by mouth daily. 10/25/11 10/24/12 Yes Reva Bores, MD  ipratropium-albuterol (DUONEB) 0.5-2.5 (3)  MG/3ML SOLN Take 3 mLs by nebulization every 4 (four) hours as needed.   Yes Historical Provider, MD  loratadine (CLARITIN) 10 MG tablet Take 1 tablet (10 mg total) by mouth daily. 09/28/11 09/27/12 Yes Myra Inda Coke, MD  metoprolol tartrate (LOPRESSOR) 25 MG tablet Take 1 tablet (25 mg total) by mouth 2 (two) times daily. 09/28/11 09/27/12 Yes Myra Inda Coke, MD  oxyCODONE-acetaminophen (PERCOCET) 5-325 MG per tablet Take 3 tablets by mouth every 8 (eight) hours as needed.   Yes Historical Provider, MD  pantoprazole (PROTONIX) 40 MG tablet Take 1 tablet (40 mg total) by mouth daily. 09/28/11 09/27/12 Yes  Myra Inda Coke, MD  permethrin (ELIMITE) 5 % cream Apply 1 application topically once.   Yes Historical Provider, MD  potassium chloride SA (K-DUR,KLOR-CON) 20 MEQ tablet Take 1 tablet (20 mEq total) by mouth daily. 10/22/11 10/21/12 Yes Reva Bores, MD  predniSONE (DELTASONE) 10 MG tablet Take 10 mg by mouth daily. Take with food 4 tabs daily x 4 days, 3 tabs daily x 4 days, 2 tabs daily x 4 days, 1 tab daily x 4 days then stop 10/30/11  Yes Oretha Milch, MD  Prenatal Vit-Fe Fumarate-FA (PRENATAL MULTIVITAMIN) TABS Take 1 tablet by mouth every morning.   Yes Historical Provider, MD   Allergies Allergies  Allergen Reactions  . Latex Itching and Swelling    Blisters  . Morphine And Related Itching    Family History Family History  Problem Relation Age of Onset  . Sickle cell trait Maternal Aunt   . Sickle cell anemia Other    Social History  reports that she has never smoked. She has never used smokeless tobacco. She reports that she does not drink alcohol or use illicit drugs.    Current Status: On ante-natal unit/ 02 @ 2lpm and comfortable at rest  Vital Signs: Temp:  [98 F (36.7 C)-99.5 F (37.5 C)] 99 F (37.2 C) (08/12 1305) Pulse Rate:  [107-122] 107  (08/12 1305) Resp:  [20-24] 24  (08/12 1305) BP: (122-146)/(65-87) 124/65 mmHg (08/12 1305) SpO2:  [100 %] 100 % (08/12 1148) Weight:  [273 lb 11.2 oz (124.15 kg)-283 lb 8 oz (128.595 kg)] 273 lb 11.2 oz (124.15 kg) (08/12 1314) FI02 2lpm   Physical Examination: General:  Obese bf nad oropharynx clear, neck supple Lungs with distant mid exp wheeze bilaterally RRR no s3 or increase P2,  I/II - VI SEM abd c/w term pregnancy Ext trace edema Neuro intact sensorium, no motor def  Active Problems:  Asthma  Hypoxia  OSA (obstructive sleep apnea)   ASSESSMENT AND PLAN  PULMONARY No results found for this basename: PHART:5,PCO2:5,PCO2ART:5,PO2ART:5,HCO3:5,O2SAT:5 in the last 168 hours        A:  Steroid dep  asthma       OSA        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     Dr Craige Cotta available for questions re rx of asthma/ osa but strongly suspect her breathing will improve dramatically p c-section and would not delay if needed for the baby.  In terms of an elective procedure would  wait 48 hours and re-visit timing     Sandrea Hughs, MD Pulmonary and Critical Care Medicine [facility] Cell [phone]

## 2011-11-27 ENCOUNTER — Inpatient Hospital Stay (HOSPITAL_COMMUNITY): Payer: Medicaid Other

## 2011-11-27 DIAGNOSIS — J45909 Unspecified asthma, uncomplicated: Secondary | ICD-10-CM

## 2011-11-27 MED ORDER — ALBUTEROL SULFATE (5 MG/ML) 0.5% IN NEBU
2.5000 mg | INHALATION_SOLUTION | RESPIRATORY_TRACT | Status: DC
  Administered 2011-11-27 – 2011-12-01 (×22): 2.5 mg via RESPIRATORY_TRACT
  Filled 2011-11-27 (×31): qty 0.5

## 2011-11-27 MED ORDER — MAGNESIUM HYDROXIDE 400 MG/5ML PO SUSP
30.0000 mL | Freq: Once | ORAL | Status: AC
  Administered 2011-11-27: 30 mL via ORAL
  Filled 2011-11-27: qty 30

## 2011-11-27 MED ORDER — IPRATROPIUM BROMIDE 0.02 % IN SOLN
0.5000 mg | RESPIRATORY_TRACT | Status: DC
  Administered 2011-11-27 – 2011-12-01 (×22): 0.5 mg via RESPIRATORY_TRACT
  Filled 2011-11-27 (×31): qty 2.5

## 2011-11-27 NOTE — Progress Notes (Signed)
RT found Pt's CPAP machine in room with the exhalation port covered with tape, and patient stated she covered the exhalation port with the tape. Pt was reminded not to do this and that she was covering her exhalaton valve and she can not do that. I explained to her how dangerous this could be and pt agreed not to do this anymore. RN aware and RT will contine to pass along in report that patient has been doing this and twice we have told her she is not to do that because it could be life threatening to her. Pt seemed to understand the dangers and will not do this in the future.

## 2011-11-27 NOTE — Progress Notes (Signed)
While placing nasal mask back on patient noticed noise from machine no longer heard. i said to patient...i will check machine since its so quiet. Patient states...."oh i put tape over the hole because it was too loud and i couldn't sleep". i discovered that the "hole" is the exhalation port and immediately removed the tape and explained to patient that it is necessary for machine to work properly and for her exhalation. She replied "ok". i then notified RN and will pass on to next RT at shift report.

## 2011-11-27 NOTE — Progress Notes (Signed)
Pt gets Albuterol/Atrovent Nebs at home for Q hr and prn use. Will have nurse ask Pulmonology to add Atrovent to patients current treatment orders.

## 2011-11-27 NOTE — Progress Notes (Signed)
MFM Note  [name] is a 36 year old G3P2 AA female at 38+[redacted] weeks gestation who was admitted yesterday for worsening pulmonary status. Since mid June, when she was admitted with respiratory distress, [name] has been dependent on steroids and oxygen to maintain an appropriate O2 saturation. She has multiple medical conditions and some of them have contributed to her shortness of breath including asthma, obstructed sleep apnea, anemia and morbid obesity. Per Dr. Penne Lash, her condition has worsened over the past 2 weeks despite ongoing treatment. All of her providers, including her pulmonologist, agree that her pulmonary status will greatly improve if she is delivered and, at 38+ weeks, newborn outcome should not be compromised or only to a minimal degree.  I agree with plans to deliver by repeat C/S tomorrow.  (Face-to-face consultation with patient: 30 min)

## 2011-11-27 NOTE — Progress Notes (Signed)
Pt takes nebs at home Q4 hrs and prn

## 2011-11-27 NOTE — Progress Notes (Signed)
UR Chart review completed.  

## 2011-11-27 NOTE — Progress Notes (Signed)
Patient ID: Cheyenne Arias, female   DOB: [birth year], 36 y.o.   MRN: [id number] FACULTY PRACTICE ANTEPARTUM(COMPREHENSIVE) NOTE  Cheyenne Arias is a 36 y.o. G3P2002 at [redacted]w[redacted]d who is admitted for asthma exacerbation.   Fetal presentation is cephalic. Length of Stay:  1  Days  Subjective: Patient reports feeling much better in comparison to admission.  Patient reports the fetal movement as active. Patient reports uterine contraction  activity as none. Patient reports  vaginal bleeding as none. Patient describes fluid per vagina as None.  Vitals:  Blood pressure 123/60, pulse 107, temperature 98.1 F (36.7 C), temperature source Oral, resp. rate 20, height 5\' 2"  (1.575 m), weight 124.15 kg (273 lb 11.2 oz), last menstrual period 02/21/2011, SpO2 100.00%. Physical Examination:  General appearance - alert, well appearing, and in no distress Chest - clear to auscultation bilaterally with mild wheezing at lung bases Abdomen - soft, gravid, NT Extremities - Homan's sign negative bilaterally Fundal Height:   Pelvic Exam:  examination not indicated Cervical Exam: Not evaluated.    Fetal Monitoring:  Baseline: 130 bpm, Variability: Good {> 6 bpm), Accelerations: Reactive, Decelerations: Absent and Toco: no contractions  Labs:  Recent Results (from the past 24 hour(s))  CBC WITH DIFFERENTIAL   Collection Time   11/26/11  2:50 PM      Component Value Range   WBC 13.6 (*) 4.0 - 10.5 K/uL   RBC 5.39 (*) 3.87 - 5.11 MIL/uL   Hemoglobin 10.6 (*) 12.0 - 15.0 g/dL   HCT 56.2 (*) 13.0 - 86.5 %   MCV 66.0 (*) 78.0 - 100.0 fL   MCH 19.7 (*) 26.0 - 34.0 pg   MCHC 29.8 (*) 30.0 - 36.0 g/dL   RDW 78.4 (*) 69.6 - 29.5 %   Platelets 361  150 - 400 K/uL   Neutrophils Relative 76  43 - 77 %   Neutro Abs 10.3 (*) 1.7 - 7.7 K/uL   Lymphocytes Relative 10 (*) 12 - 46 %   Lymphs Abs 1.3  0.7 - 4.0 K/uL   Monocytes Relative 5  3 - 12 %   Monocytes Absolute 0.6  0.1 - 1.0 K/uL   Eosinophils Relative  10 (*) 0 - 5 %   Eosinophils Absolute 1.3 (*) 0.0 - 0.7 K/uL   Basophils Relative 0  0 - 1 %   Basophils Absolute 0.0  0.0 - 0.1 K/uL  BASIC METABOLIC PANEL   Collection Time   11/26/11  2:50 PM      Component Value Range   Sodium 135  135 - 145 mEq/L   Potassium 3.8  3.5 - 5.1 mEq/L   Chloride 100  96 - 112 mEq/L   CO2 24  19 - 32 mEq/L   Glucose, Bld 99  70 - 99 mg/dL   BUN 5 (*) 6 - 23 mg/dL   Creatinine, Ser 2.84 (*) 0.50 - 1.10 mg/dL   Calcium 9.1  8.4 - 13.2 mg/dL   GFR calc non Af Amer >90  >90 mL/min   GFR calc Af Amer >90  >90 mL/min  TYPE AND SCREEN   Collection Time   11/26/11  2:50 PM      Component Value Range   ABO/RH(D) B POS     Antibody Screen NEG     Sample Expiration 11/29/2011     Unit Number [id number]     Blood Component Type RED CELLS,LR     Unit division 00     Status  of Unit ALLOCATED     Transfusion Status OK TO TRANSFUSE     Crossmatch Result Compatible     Unit Number [id number]     Blood Component Type RED CELLS,LR     Unit division 00     Status of Unit ALLOCATED     Transfusion Status OK TO TRANSFUSE     Crossmatch Result Compatible    PREPARE RBC (CROSSMATCH)   Collection Time   11/26/11  6:30 PM      Component Value Range   Order Confirmation ORDER PROCESSED BY BLOOD BANK      Imaging Studies:    Currently EPIC will not allow sonographic studies to automatically populate into notes.  In the meantime, copy and paste results into note or free text.  Medications:  Scheduled    . albuterol  2.5 mg Nebulization Q4H  . amLODipine  10 mg Oral Daily  . bisoprolol  2.5 mg Oral Daily  . budesonide  0.5 mg Nebulization BID  . docusate sodium  100 mg Oral Daily  . furosemide  40 mg Oral Daily  . loratadine  10 mg Oral Daily  . methylPREDNISolone (SOLU-MEDROL) injection  80 mg Intravenous Q8H  . pantoprazole  40 mg Oral BID AC  . potassium chloride SA  20 mEq Oral Daily  . prenatal multivitamin  1 tablet Oral Daily   I have reviewed  the patient's current medications.  ASSESSMENT: Patient Active Problem List  Diagnosis  . Fibromyalgia  . Low back pain  . AMA (advanced maternal age) multigravida 35+  . Obesity  . Fibroids  . Anemia  . Hypertension complicating pregnancy, childbirth and puerperium, antepartum  . Benign essential HTN  . Supervision of high-risk pregnancy  . ASCUS with positive high risk HPV  . History of cesarean delivery, currently pregnant  . Asthma complicating pregnancy, antepartum  . Asthma  . Hypoxia  . Peripheral edema  . OSA (obstructive sleep apnea)    PLAN: 36 yo G3P2002 at [redacted]w[redacted]d admitted with asthma exacerbation - Continue breathing treatments and steroids as per pulmonology - Will consult with MFM regarding delivery plan upon improvement in respiratory status - Continue current antenatal care   Kari Montero 11/27/2011,6:53 AM

## 2011-11-27 NOTE — Progress Notes (Signed)
Up to bathroom

## 2011-11-27 NOTE — Anesthesia Preprocedure Evaluation (Addendum)
Anesthesia Evaluation  Patient identified by MRN, date of birth, ID band Patient awake    Reviewed: Allergy & Precautions, H&P , NPO status , Patient's Chart, lab work & pertinent test results  History of Anesthesia Complications (+) PONV  Airway Mallampati: III      Dental No notable dental hx.    Pulmonary neg pulmonary ROS, shortness of breath, asthma , sleep apnea ,  breath sounds clear to auscultation  Pulmonary exam normal       Cardiovascular Exercise Tolerance: Good hypertension, negative cardio ROS  Rhythm:regular Rate:Normal     Neuro/Psych  Headaches, PSYCHIATRIC DISORDERS Anxiety Depression  Neuromuscular disease negative neurological ROS  negative psych ROS   GI/Hepatic negative GI ROS, Neg liver ROS,   Endo/Other  negative endocrine ROSMorbid obesity  Renal/GU negative Renal ROS  negative genitourinary   Musculoskeletal  (+) Fibromyalgia -  Abdominal Normal abdominal exam  (+)   Peds  Hematology negative hematology ROS (+)   Anesthesia Other Findings PONV (postoperative nausea and vomiting)     Hypertension        Migraine     Anxiety        Abnormal Pap smear     Anemia        Fibroids     Fibromyalgia   degenerative disc disease    Depression     Sleep apnea        Shortness of breath     On home oxygen therapy   2 liters [place]    Low iron   pt states low iron-for Feraheme today in MAU Morbid obesity with BMI of 45.0-49.9, adult        Asthma   recent admission with exacerbation of asthma 09/15/11    Reproductive/Obstetrics (+) Pregnancy                           Anesthesia Physical Anesthesia Plan  ASA: III  Anesthesia Plan: Spinal   Post-op Pain Management:    Induction:   Airway Management Planned:   Additional Equipment:   Intra-op Plan:   Post-operative Plan:   Informed Consent: I have reviewed the patients History and Physical, chart, labs and discussed  the procedure including the risks, benefits and alternatives for the proposed anesthesia with the patient or authorized representative who has indicated his/her understanding and acceptance.     Plan Discussed with: Anesthesiologist, CRNA and Surgeon  Anesthesia Plan Comments:        patient wants to avoid morphine in the spinal. Surgeon to order PCA.  Please follow OSA protocol. Anesthesia Quick Evaluation

## 2011-11-27 NOTE — Progress Notes (Signed)
Patient ID: Cheyenne Arias, female   DOB: Apr 17, 1975, 36 y.o.   MRN: [id number] I spoke with MFM regarding timing of delivery.  They have recommended that we proceed with delivery.  This was also confirmed by pulmonology.    I discussed with pt the pre-op instructions including the need to be NPO after MN.  Pt and family expressed understanding.  All of pt and her mother's questions answered.   c-section posted for tomorrow am at 1145am with Dr. Debroah Loop.  Alexah Kivett L. Harraway-Smith, M.D., [facility]

## 2011-11-28 ENCOUNTER — Encounter (HOSPITAL_COMMUNITY)
Admission: AD | Disposition: A | Discharge status: Home / Self Care | Payer: Self-pay | Source: Ambulatory Visit | Attending: Obstetrics and Gynecology

## 2011-11-28 ENCOUNTER — Inpatient Hospital Stay (HOSPITAL_COMMUNITY): Payer: Medicaid Other | Admitting: Anesthesiology

## 2011-11-28 ENCOUNTER — Encounter (HOSPITAL_COMMUNITY): Payer: Self-pay | Admitting: *Deleted

## 2011-11-28 ENCOUNTER — Encounter (HOSPITAL_COMMUNITY): Payer: Self-pay | Admitting: Anesthesiology

## 2011-11-28 DIAGNOSIS — O34219 Maternal care for unspecified type scar from previous cesarean delivery: Secondary | ICD-10-CM

## 2011-11-28 DIAGNOSIS — O9989 Other specified diseases and conditions complicating pregnancy, childbirth and the puerperium: Secondary | ICD-10-CM

## 2011-11-28 DIAGNOSIS — J45901 Unspecified asthma with (acute) exacerbation: Secondary | ICD-10-CM

## 2011-11-28 DIAGNOSIS — O1002 Pre-existing essential hypertension complicating childbirth: Secondary | ICD-10-CM

## 2011-11-28 LAB — CBC
Hemoglobin: 10 g/dL — ABNORMAL LOW (ref 12.0–15.0)
MCH: 20 pg — ABNORMAL LOW (ref 26.0–34.0)
MCV: 66.7 fL — ABNORMAL LOW (ref 78.0–100.0)
Platelets: 390 10*3/uL (ref 150–400)
RBC: 5.01 MIL/uL (ref 3.87–5.11)
WBC: 18.2 10*3/uL — ABNORMAL HIGH (ref 4.0–10.5)

## 2011-11-28 LAB — MRSA PCR SCREENING: MRSA by PCR: NEGATIVE

## 2011-11-28 SURGERY — Surgical Case
Anesthesia: Spinal | Site: Abdomen | Wound class: Clean Contaminated

## 2011-11-28 MED ORDER — DIPHENHYDRAMINE HCL 12.5 MG/5ML PO ELIX
12.5000 mg | ORAL_SOLUTION | Freq: Four times a day (QID) | ORAL | Status: DC | PRN
  Filled 2011-11-28: qty 5

## 2011-11-28 MED ORDER — METOCLOPRAMIDE HCL 5 MG/ML IJ SOLN: 10.0000 mg | Freq: Once | INTRAMUSCULAR | Status: DC | PRN

## 2011-11-28 MED ORDER — PRENATAL MULTIVITAMIN CH
1.0000 | ORAL_TABLET | Freq: Every day | ORAL | Status: DC
  Administered 2011-11-29 – 2011-11-30 (×2): 1 via ORAL
  Filled 2011-11-28 (×2): qty 1

## 2011-11-28 MED ORDER — ONDANSETRON HCL 4 MG PO TABS: 4.0000 mg | ORAL_TABLET | ORAL | Status: DC | PRN

## 2011-11-28 MED ORDER — PROMETHAZINE HCL 25 MG/ML IJ SOLN: 6.2500 mg | INTRAMUSCULAR | Status: DC | PRN

## 2011-11-28 MED ORDER — LIDOCAINE-EPINEPHRINE (PF) 2 %-1:200000 IJ SOLN
INTRAMUSCULAR | Status: AC
  Filled 2011-11-28: qty 20

## 2011-11-28 MED ORDER — MORPHINE SULFATE 0.5 MG/ML IJ SOLN
INTRAMUSCULAR | Status: AC
  Filled 2011-11-28: qty 10

## 2011-11-28 MED ORDER — PHENYLEPHRINE 40 MCG/ML (10ML) SYRINGE FOR IV PUSH (FOR BLOOD PRESSURE SUPPORT)
PREFILLED_SYRINGE | INTRAVENOUS | Status: AC
  Filled 2011-11-28: qty 10

## 2011-11-28 MED ORDER — EPHEDRINE 5 MG/ML INJ
INTRAVENOUS | Status: AC
  Filled 2011-11-28: qty 10

## 2011-11-28 MED ORDER — SODIUM BICARBONATE 8.4 % IV SOLN
INTRAVENOUS | Status: AC
  Filled 2011-11-28: qty 50

## 2011-11-28 MED ORDER — HYDROMORPHONE HCL PF 1 MG/ML IJ SOLN
INTRAMUSCULAR | Status: DC | PRN
  Administered 2011-11-28: 1 mg via INTRAVENOUS

## 2011-11-28 MED ORDER — LACTATED RINGERS IV SOLN: INTRAVENOUS | Status: DC

## 2011-11-28 MED ORDER — SIMETHICONE 80 MG PO CHEW
80.0000 mg | CHEWABLE_TABLET | Freq: Three times a day (TID) | ORAL | Status: DC
  Administered 2011-11-28 – 2011-12-01 (×10): 80 mg via ORAL

## 2011-11-28 MED ORDER — LIDOCAINE-EPINEPHRINE 2 %-1:100000 IJ SOLN
INTRAMUSCULAR | Status: DC | PRN
  Administered 2011-11-28: 7 mL via INTRADERMAL

## 2011-11-28 MED ORDER — PHENYLEPHRINE HCL 10 MG/ML IJ SOLN
INTRAMUSCULAR | Status: DC | PRN
  Administered 2011-11-28: 40 ug via INTRAVENOUS
  Administered 2011-11-28 (×2): 80 ug via INTRAVENOUS
  Administered 2011-11-28: 40 ug via INTRAVENOUS
  Administered 2011-11-28 (×2): 80 ug via INTRAVENOUS
  Administered 2011-11-28 (×2): 40 ug via INTRAVENOUS

## 2011-11-28 MED ORDER — IBUPROFEN 600 MG PO TABS
600.0000 mg | ORAL_TABLET | Freq: Four times a day (QID) | ORAL | Status: DC
  Administered 2011-11-29 – 2011-11-30 (×5): 600 mg via ORAL
  Filled 2011-11-28 (×5): qty 1

## 2011-11-28 MED ORDER — WITCH HAZEL-GLYCERIN EX PADS: 1.0000 "application " | MEDICATED_PAD | CUTANEOUS | Status: DC | PRN

## 2011-11-28 MED ORDER — OXYTOCIN 10 UNIT/ML IJ SOLN
40.0000 [IU] | INTRAVENOUS | Status: DC | PRN
  Administered 2011-11-28: 40 [IU] via INTRAVENOUS

## 2011-11-28 MED ORDER — CITRIC ACID-SODIUM CITRATE 334-500 MG/5ML PO SOLN
ORAL | Status: AC
  Filled 2011-11-28: qty 15

## 2011-11-28 MED ORDER — ONDANSETRON HCL 4 MG/2ML IJ SOLN
INTRAMUSCULAR | Status: DC | PRN
  Administered 2011-11-28: 4 mg via INTRAVENOUS

## 2011-11-28 MED ORDER — LORAZEPAM 2 MG/ML IJ SOLN: 1.0000 mg | Freq: Three times a day (TID) | INTRAMUSCULAR | Status: DC | PRN

## 2011-11-28 MED ORDER — OXYTOCIN 40 UNITS IN LACTATED RINGERS INFUSION - SIMPLE MED: 62.5000 mL/h | INTRAVENOUS | Status: AC

## 2011-11-28 MED ORDER — SIMETHICONE 80 MG PO CHEW: 80.0000 mg | CHEWABLE_TABLET | ORAL | Status: DC | PRN

## 2011-11-28 MED ORDER — NALOXONE HCL 0.4 MG/ML IJ SOLN: 0.4000 mg | INTRAMUSCULAR | Status: DC | PRN

## 2011-11-28 MED ORDER — LANOLIN HYDROUS EX OINT: 1.0000 "application " | TOPICAL_OINTMENT | CUTANEOUS | Status: DC | PRN

## 2011-11-28 MED ORDER — MAGNESIUM HYDROXIDE 400 MG/5ML PO SUSP
15.0000 mL | Freq: Every day | ORAL | Status: DC | PRN
  Administered 2011-11-28: 15 mL via ORAL
  Filled 2011-11-28: qty 30

## 2011-11-28 MED ORDER — 0.9 % SODIUM CHLORIDE (POUR BTL) OPTIME
TOPICAL | Status: DC | PRN
  Administered 2011-11-28: 1000 mL

## 2011-11-28 MED ORDER — HYDROMORPHONE 0.3 MG/ML IV SOLN
INTRAVENOUS | Status: DC
  Administered 2011-11-28: 0.3 mL via INTRAVENOUS
  Filled 2011-11-28: qty 25

## 2011-11-28 MED ORDER — EPHEDRINE SULFATE 50 MG/ML IJ SOLN
INTRAMUSCULAR | Status: DC | PRN
  Administered 2011-11-28: 5 mg via INTRAVENOUS

## 2011-11-28 MED ORDER — PHENYLEPHRINE 40 MCG/ML (10ML) SYRINGE FOR IV PUSH (FOR BLOOD PRESSURE SUPPORT)
PREFILLED_SYRINGE | INTRAVENOUS | Status: AC
  Filled 2011-11-28: qty 5

## 2011-11-28 MED ORDER — SCOPOLAMINE 1 MG/3DAYS TD PT72
1.0000 | MEDICATED_PATCH | Freq: Once | TRANSDERMAL | Status: DC
  Filled 2011-11-28: qty 1

## 2011-11-28 MED ORDER — ONDANSETRON HCL 4 MG/2ML IJ SOLN
INTRAMUSCULAR | Status: AC
  Filled 2011-11-28: qty 2

## 2011-11-28 MED ORDER — FENTANYL CITRATE 0.05 MG/ML IJ SOLN
INTRAMUSCULAR | Status: AC
  Filled 2011-11-28: qty 2

## 2011-11-28 MED ORDER — MENTHOL 3 MG MT LOZG: 1.0000 | LOZENGE | OROMUCOSAL | Status: DC | PRN

## 2011-11-28 MED ORDER — DIPHENHYDRAMINE HCL 25 MG PO CAPS: 25.0000 mg | ORAL_CAPSULE | Freq: Four times a day (QID) | ORAL | Status: DC | PRN

## 2011-11-28 MED ORDER — LACTATED RINGERS IV SOLN
INTRAVENOUS | Status: DC
  Administered 2011-11-28 (×2): via INTRAVENOUS

## 2011-11-28 MED ORDER — DIBUCAINE 1 % RE OINT: 1.0000 "application " | TOPICAL_OINTMENT | RECTAL | Status: DC | PRN

## 2011-11-28 MED ORDER — DEXTROSE-NACL 5-0.45 % IV SOLN: INTRAVENOUS | Status: DC

## 2011-11-28 MED ORDER — ZOLPIDEM TARTRATE 5 MG PO TABS
5.0000 mg | ORAL_TABLET | Freq: Every evening | ORAL | Status: DC | PRN
  Filled 2011-11-28: qty 1

## 2011-11-28 MED ORDER — OXYTOCIN 10 UNIT/ML IJ SOLN
INTRAMUSCULAR | Status: AC
  Filled 2011-11-28: qty 4

## 2011-11-28 MED ORDER — PROMETHAZINE HCL 25 MG PO TABS
25.0000 mg | ORAL_TABLET | Freq: Four times a day (QID) | ORAL | Status: DC | PRN
  Administered 2011-11-28: 25 mg via ORAL
  Filled 2011-11-28: qty 1

## 2011-11-28 MED ORDER — BUPIVACAINE IN DEXTROSE 0.75-8.25 % IT SOLN
INTRATHECAL | Status: DC | PRN
  Administered 2011-11-28: 1.7 mL via INTRATHECAL

## 2011-11-28 MED ORDER — DIPHENHYDRAMINE HCL 50 MG/ML IJ SOLN: 12.5000 mg | Freq: Four times a day (QID) | INTRAMUSCULAR | Status: DC | PRN

## 2011-11-28 MED ORDER — KETOROLAC TROMETHAMINE 30 MG/ML IJ SOLN: 15.0000 mg | Freq: Once | INTRAMUSCULAR | Status: DC | PRN

## 2011-11-28 MED ORDER — FENTANYL CITRATE 0.05 MG/ML IJ SOLN
INTRAMUSCULAR | Status: DC | PRN
  Administered 2011-11-28 (×2): 50 ug via INTRAVENOUS

## 2011-11-28 MED ORDER — SODIUM CHLORIDE 0.9 % IJ SOLN: 9.0000 mL | INTRAMUSCULAR | Status: DC | PRN

## 2011-11-28 MED ORDER — LORAZEPAM BOLUS VIA INFUSION: 1.0000 mg | Freq: Three times a day (TID) | INTRAVENOUS | Status: DC | PRN

## 2011-11-28 MED ORDER — CITRIC ACID-SODIUM CITRATE 334-500 MG/5ML PO SOLN
30.0000 mL | Freq: Once | ORAL | Status: AC
  Administered 2011-11-28: 30 mL via ORAL

## 2011-11-28 MED ORDER — FENTANYL CITRATE 0.05 MG/ML IJ SOLN: 25.0000 ug | INTRAMUSCULAR | Status: DC | PRN

## 2011-11-28 MED ORDER — OXYCODONE-ACETAMINOPHEN 5-325 MG PO TABS
1.0000 | ORAL_TABLET | ORAL | Status: DC | PRN
  Administered 2011-11-29: 1 via ORAL
  Administered 2011-11-29: 2 via ORAL

## 2011-11-28 MED ORDER — HYDROMORPHONE HCL PF 1 MG/ML IJ SOLN
0.2500 mg | INTRAMUSCULAR | Status: DC | PRN
  Administered 2011-11-28 (×2): 0.5 mg via INTRAVENOUS

## 2011-11-28 MED ORDER — HYDROMORPHONE HCL PF 1 MG/ML IJ SOLN
INTRAMUSCULAR | Status: AC
  Filled 2011-11-28: qty 1

## 2011-11-28 MED ORDER — ONDANSETRON HCL 4 MG/2ML IJ SOLN: 4.0000 mg | INTRAMUSCULAR | Status: DC | PRN

## 2011-11-28 MED ORDER — SENNOSIDES-DOCUSATE SODIUM 8.6-50 MG PO TABS
2.0000 | ORAL_TABLET | Freq: Every day | ORAL | Status: DC
  Administered 2011-11-28 – 2011-11-30 (×3): 2 via ORAL

## 2011-11-28 MED ORDER — MIDAZOLAM HCL 2 MG/2ML IJ SOLN: 0.5000 mg | Freq: Once | INTRAMUSCULAR | Status: DC | PRN

## 2011-11-28 MED ORDER — MEPERIDINE HCL 25 MG/ML IJ SOLN: 6.2500 mg | INTRAMUSCULAR | Status: DC | PRN

## 2011-11-28 MED ORDER — LIDOCAINE HCL (PF) 1 % IJ SOLN
INTRAMUSCULAR | Status: AC
  Filled 2011-11-28: qty 5

## 2011-11-28 MED ORDER — DEXTROSE 5 % IV SOLN
3.0000 g | INTRAVENOUS | Status: AC
  Administered 2011-11-28: 3 g via INTRAVENOUS
  Filled 2011-11-28 (×3): qty 3000

## 2011-11-28 MED ORDER — KETOROLAC TROMETHAMINE 30 MG/ML IJ SOLN
30.0000 mg | Freq: Three times a day (TID) | INTRAMUSCULAR | Status: DC
  Administered 2011-11-28: 30 mg via INTRAVENOUS
  Filled 2011-11-28: qty 1

## 2011-11-28 MED ORDER — TETANUS-DIPHTH-ACELL PERTUSSIS 5-2.5-18.5 LF-MCG/0.5 IM SUSP
0.5000 mL | Freq: Once | INTRAMUSCULAR | Status: DC
  Filled 2011-11-28: qty 0.5

## 2011-11-28 MED ORDER — ONDANSETRON HCL 4 MG/2ML IJ SOLN: 4.0000 mg | Freq: Four times a day (QID) | INTRAMUSCULAR | Status: DC | PRN

## 2011-11-28 SURGICAL SUPPLY — 29 items
BARRIER ADHS 3X4 INTERCEED (GAUZE/BANDAGES/DRESSINGS) IMPLANT
CHLORAPREP W/TINT 26ML (MISCELLANEOUS) ×2 IMPLANT
CLOTH BEACON ORANGE TIMEOUT ST (SAFETY) ×2 IMPLANT
DRSG COVADERM 4X10 (GAUZE/BANDAGES/DRESSINGS) ×2 IMPLANT
ELECT REM PT RETURN 9FT ADLT (ELECTROSURGICAL) ×2
ELECTRODE REM PT RTRN 9FT ADLT (ELECTROSURGICAL) ×1 IMPLANT
EXTRACTOR VACUUM KIWI (MISCELLANEOUS) IMPLANT
GLOVE BIO SURGEON STRL SZ 6.5 (GLOVE) ×4 IMPLANT
GLOVE BIOGEL PI IND STRL 7.0 (GLOVE) ×2 IMPLANT
GLOVE BIOGEL PI INDICATOR 7.0 (GLOVE) ×2
GOWN PREVENTION PLUS LG XLONG (DISPOSABLE) ×6 IMPLANT
KIT ABG SYR 3ML LUER SLIP (SYRINGE) IMPLANT
NEEDLE HYPO 25X5/8 SAFETYGLIDE (NEEDLE) IMPLANT
NS IRRIG 1000ML POUR BTL (IV SOLUTION) ×2 IMPLANT
PACK C SECTION WH (CUSTOM PROCEDURE TRAY) ×2 IMPLANT
PAD ABD 7.5X8 STRL (GAUZE/BANDAGES/DRESSINGS) ×2 IMPLANT
RETRACTOR WND ALEXIS 25 LRG (MISCELLANEOUS) ×1 IMPLANT
RTRCTR WOUND ALEXIS 25CM LRG (MISCELLANEOUS) ×2
SLEEVE SCD COMPRESS KNEE LRG (MISCELLANEOUS) IMPLANT
SLEEVE SCD COMPRESS KNEE MED (MISCELLANEOUS) IMPLANT
SPONGE GAUZE 4X4 12PLY (GAUZE/BANDAGES/DRESSINGS) ×2 IMPLANT
SUT VIC AB 0 CT1 36 (SUTURE) ×12 IMPLANT
SUT VIC AB 2-0 CT1 27 (SUTURE) ×1
SUT VIC AB 2-0 CT1 TAPERPNT 27 (SUTURE) ×1 IMPLANT
SUT VIC AB 4-0 PS2 27 (SUTURE) ×2 IMPLANT
TAPE CLOTH SURG 4X10 WHT LF (GAUZE/BANDAGES/DRESSINGS) ×2 IMPLANT
TOWEL OR 17X24 6PK STRL BLUE (TOWEL DISPOSABLE) ×4 IMPLANT
TRAY FOLEY CATH 14FR (SET/KITS/TRAYS/PACK) ×2 IMPLANT
WATER STERILE IRR 1000ML POUR (IV SOLUTION) IMPLANT

## 2011-11-28 NOTE — Transfer of Care (Signed)
Immediate Anesthesia Transfer of Care Note  Patient: Cheyenne Arias  Procedure(s) Performed: Procedure(s) (LRB): CESAREAN SECTION (N/A)  Patient Location: PACU  Anesthesia Type: Spinal and Epidural  Level of Consciousness: awake, alert  and oriented  Airway & Oxygen Therapy: Patient Spontanous Breathing and Patient connected to nasal cannula oxygen  Post-op Assessment: Report given to PACU RN and Post -op Vital signs reviewed and stable  Post vital signs: stable  Complications: No apparent anesthesia complications

## 2011-11-28 NOTE — Anesthesia Postprocedure Evaluation (Signed)
  Anesthesia Post-op Note  Patient: Cheyenne Arias  Procedure(s) Performed: Procedure(s) (LRB): CESAREAN SECTION (N/A)  Patient Location: PACU  Anesthesia Type: Spinal and Epidural  Level of Consciousness: awake, alert  and oriented  Airway and Oxygen Therapy: Patient Spontanous Breathing  Post-op Pain: moderate  Post-op Assessment: Post-op Vital signs reviewed, Patient's Cardiovascular Status Stable, Respiratory Function Stable, Patent Airway, No signs of Nausea or vomiting, Pain level controlled, No headache, No backache, No residual numbness and No residual motor weakness  Post-op Vital Signs: Reviewed and stable  Complications: No apparent anesthesia complications

## 2011-11-28 NOTE — Op Note (Signed)
Preoperative Diagnosis:  IUP @ [redacted]w[redacted]d, history prior c-sections, chronic hypertension, exacerbation of pulmonary disease  Postoperative Diagnosis:  Same  Procedure: Repeat low transverse cesarean section  Surgeon: Scheryl Darter, MD  Assistant: Napoleon Form, MD  Findings: Viable female infant, APGAR (1 MIN): 9   APGAR (5 MINS): 9  Vertex presentation, ROA position  Estimated blood loss: 700 cc  Complications: None known  Specimens: Placenta to labor and delivery  Reason for procedure: Briefly, the patient is a 36 y.o. [id number] [redacted]w[redacted]d who presents for repeat low transverse c-section for chronic hypertension and exacerbation of pulmonary disease.  Procedure: Patient was taken to the OR where spinal and epidural analgesia wer administered. She was then placed in a supine position with left lateral tilt. She received 3 g of Ancef and SCDs were in place. A timeout was performed. She was prepped and draped in the usual sterile fashion. A Foley catheter was placed in the bladder. A knife was then used to make a Pfannenstiel incision. This incision was carried out to underlying fascia which was divided in the midline with Mayo scissors. The incision was extended laterally, sharply.  The rectus was divided in the midline.  The peritoneal cavity was entered bluntly.  Alexis retractor was placed inside the incision.  A knife was used to make a low transverse incision on the uterus. This incision was carried down to the amniotic cavity was entered. Fetus was in ROA position and was brought up out of the incision without difficulty. Cord was clamped x 2 and cut. Infant taken to waiting pediatrician.  Cord blood was obtained. Placenta was delivered from the uterus.  Uterus was cleaned with dry lap pads. Uterine incision closed with 0 Vicryl suture in a locked running fashion. A second layer of 0 Vicryl in an imbricating fashion was used to achieve hemostasis. Alexis retractor was removed from the abdomen. Peritoneal  closure was done with 2.0 Vicryl suture.  Fascia is closed with 0 Vicryl suture in a running fashion. Subcutaneous closure was not done as subcutaneous layer was less than 2 cm.  Skin closed using 4-0 Vicryl. Pressure dressing was applied.  All instrument, needle and lap counts were correct x 2.  Patient was awake and taken to PACU stable.  Infant to Newborn Nursery, stable.   Napoleon Form, MD

## 2011-11-28 NOTE — Progress Notes (Signed)
Pt and family instructed on pt's NPO status for surgery this am.  Teach back demonstrated understanding.  All food items at bedside bagged and removed from pt's immediate area.

## 2011-11-28 NOTE — Anesthesia Procedure Notes (Signed)
Spinal  Patient location during procedure: OR Start time: 11/28/2011 12:20 PM Staffing Anesthesiologist: Garnie Borchardt A. Performed by: anesthesiologist  Preanesthetic Checklist Completed: patient identified, site marked, surgical consent, pre-op evaluation, timeout performed, IV checked, risks and benefits discussed and monitors and equipment checked Spinal Block Patient position: sitting Prep: site prepped and draped and DuraPrep Patient monitoring: cardiac monitor, continuous pulse ox, blood pressure and heart rate Approach: midline Location: L3-4 Injection technique: catheter Needle Needle type: Tuohy and Sprotte  Needle gauge: 24 G Needle length: 12.7 cm Needle insertion depth: 9 cm Catheter type: closed end flexible Catheter size: 19 g Assessment Sensory level: T3 Additional Notes Patient tolerated procedure well. Sitting position L3-L4. Duraprep Sterile drape, 10ml Lido 1% local. 17ga Touhy x2 LOR with air. No csf, heme or paresthesias. Sprotte through the epidural needle. CSF clear free flow, no paresthesias. Bupivacaine 0.75% in 8.25% dextrose 1.1ml injected. Spinal needle withdrawn, 19ga epidural catheter threaded into epidural space. Sterile dressing applied. Difficult due to MO. Adequate sensory level.

## 2011-11-28 NOTE — Progress Notes (Signed)
Spoke to Dr. Despina Hidden and explained that the patient uses a CPAP to sleep and while using a PCA, she will have the sensor sitting above her nose and this will interfere with the CPAP machine.  Dr. Despina Hidden stated that she will not have to wear the censor while using her CPAP, but will remain with an O2 censor attached to her finger at all times.  I explained this to the patient and she verbalized understanding.

## 2011-11-28 NOTE — Progress Notes (Signed)
Upon meeting patient and discussing plan of care for 7p-7a shift on goal of ambulating to bathroom and working towards getting rid of the catheter patient reminded me several times of how she has fibromyalgia and "she is not like the other patients i normally take care of" so she "probably won't want to walk to the bathroom because I know my healing, my limits and just had major surgery". I told patient that throughout the night with consideration of her fibromyalgia I would let her know what we would do to the "normal" patient and I will allow her to decide with respect to her patient rights her limits. Will notify patient of benefits and rationale as to why we are doing the interventions.

## 2011-11-28 NOTE — Progress Notes (Signed)
Subjective: Patient reports that she is breathing better.  Just received a breathing treatment.  + fetal movement.  NO leakage of fluid, NO vaginal bleeding  Objective: I have reviewed patient's vital signs, intake and output, medications, labs and fetal tracing.  General: alert, mild distress and morbidly obese Lungs: clear, no wheezing or rales.  ON O2 and breathing slightly labored CV: RRR Abd: gravid.  FHR 130's.  Cat I  Assessment/Plan:  Pt for repeat c/s today due to uncontrolled asthma and multiple medical problems @ 38 4/7 weeks.  Early delivery plan confirmed by MFM and Pulm.   Continue meds per pulm Delivery planned for today. Continue NPO     LOS: 2 days    HARRAWAY-SMITH, Anae Hams 11/28/2011, 7:56 AM

## 2011-11-28 NOTE — Progress Notes (Signed)
Discussed her procedure and the risks of anesthesia, wound problems and infection, pain, bleeding and abdominal organ damage and her questions were answered. She will sign consent for repeat cesarean section.  Cheyenne Arias 11/28/2011 10:29 AM

## 2011-11-29 ENCOUNTER — Encounter (HOSPITAL_COMMUNITY): Payer: Self-pay | Admitting: Obstetrics & Gynecology

## 2011-11-29 ENCOUNTER — Ambulatory Visit (HOSPITAL_COMMUNITY)
Admission: RE | Admit: 2011-11-29 | Payer: Medicaid Other | Source: Ambulatory Visit | Admitting: Obstetrics & Gynecology

## 2011-11-29 LAB — RPR: RPR Ser Ql: NONREACTIVE

## 2011-11-29 LAB — CBC
HCT: 31.2 % — ABNORMAL LOW (ref 36.0–46.0)
Hemoglobin: 9.4 g/dL — ABNORMAL LOW (ref 12.0–15.0)
MCHC: 30.1 g/dL (ref 30.0–36.0)
RDW: 20.3 % — ABNORMAL HIGH (ref 11.5–15.5)
WBC: 15.5 10*3/uL — ABNORMAL HIGH (ref 4.0–10.5)

## 2011-11-29 MED ORDER — OXYCODONE-ACETAMINOPHEN 5-325 MG PO TABS
2.0000 | ORAL_TABLET | ORAL | Status: DC | PRN
  Administered 2011-11-29 – 2011-12-01 (×9): 2 via ORAL
  Filled 2011-11-29 (×9): qty 2

## 2011-11-29 MED ORDER — HYDROMORPHONE HCL 2 MG PO TABS
2.0000 mg | ORAL_TABLET | Freq: Four times a day (QID) | ORAL | Status: DC | PRN
  Administered 2011-11-29 – 2011-12-01 (×8): 2 mg via ORAL
  Filled 2011-11-29 (×8): qty 1

## 2011-11-29 MED ORDER — FERROUS SULFATE 325 (65 FE) MG PO TABS
325.0000 mg | ORAL_TABLET | Freq: Every day | ORAL | Status: DC
  Administered 2011-12-01: 325 mg via ORAL
  Filled 2011-11-29: qty 1

## 2011-11-29 NOTE — Progress Notes (Signed)
Subjective: Postpartum Day 1: Cesarean Delivery Patient reports incisional pain and tolerating PO.  Denies current SOB.  Foley catheter remains in place with adequate urine output. Pt sitting up on edge of bed, reports good pain control currently but is concerned with breakthrough pain since PCA is off this am.  She takes Percocet at home for fibromyalgia and is concerned that ordered Percocet will not be enough to cover her pain postoperatively.    Objective: Vital signs in last 24 hours: Temp:  [98.3 F (36.8 C)-98.9 F (37.2 C)] 98.5 F (36.9 C) (08/15 0400) Pulse Rate:  [107-119] 112  (08/15 0400) Resp:  [14-24] 24  (08/14 2330) BP: (103-159)/(63-93) 103/63 mmHg (08/15 0400) SpO2:  [97 %-100 %] 100 % (08/15 0655) FiO2 (%):  [100 %] 100 % (08/14 1815) Weight:  [123.605 kg (272 lb 8 oz)] 123.605 kg (272 lb 8 oz) (08/14 1110)    Basename 11/29/11 0515 11/28/11 0620  HGB 9.4* 10.0*  HCT 31.2* 33.4*    Assessment/Plan: Status post Cesarean section. Doing well postoperatively.   Plan to start wound vac today and discussed benefits/risks of this with pt who agreed with plan of care Discontinued SoluMedrol per Sandrea Hughs, MD, pulmonologist with plan for pt to F/U in his office within 1  week post D/C from hospital Dilaudid 2 mg PO Q6 hours for breakthrough pain  Arias, Cheyenne Soo 11/29/2011, 10:15 AM

## 2011-11-29 NOTE — Progress Notes (Addendum)
Subjective: Postpartum Day 1: Cesarean Delivery Patient reports incisional pain and tolerating PO.  Foley still in. Pt not wanting to walk yet due to pain. Has been up to couch and dangling feet over bed. Complains of dizziness with standing. No lochia per RN. Breathing well, esp after neb treatment. Pt not sure if has passed flatus or not. Pain 6/10. Has PCA but used 1 ml between 11 P and 4 AM. Breastfeeding but states baby not nursing well - only 5 mins per side max.  Objective: Vital signs in last 24 hours: Temp:  [98.3 F (36.8 C)-98.9 F (37.2 C)] 98.3 F (36.8 C) (08/14 2330) Pulse Rate:  [107-123] 114  (08/14 2330) Resp:  [14-24] 24  (08/14 2330) BP: (117-159)/(30-93) 117/66 mmHg (08/14 2330) SpO2:  [98 %-100 %] 98 % (08/15 0621) FiO2 (%):  [100 %] 100 % (08/14 1815) Weight:  [123.605 kg (272 lb 8 oz)] 123.605 kg (272 lb 8 oz) (08/14 1110)  Physical Exam:  General: alert, cooperative, no distress and morbidly obese CV:  Regular rate and rhythm, no murmurs Lungs:  CTAB, normal effort Lochia: none Uterine Fundus: Unable to assess adequately due to body habitus Incision: no significant drainage, pressure dressing still in place DVT Evaluation: No evidence of DVT seen on physical exam. SCDs in place.   Basename 11/29/11 0515 11/28/11 0620  HGB 9.4* 10.0*  HCT 31.2* 33.4*    Assessment/Plan: 1.  Status post Cesarean section. Postoperative course complicated by pain above expected level  Transition from PCA to percocet. Continue motrin and toradol. Encourage ambulation.  2.  Anemia - will start FeSO4 3.  Asthma - on steroids and nebulizer treatments. Appreciate pulmonology management and recommendations. 4.  Lactation support. 5.  Chronic hypertension.  High 150s/80s post-op. BP this AM on 103/60s, which may explain some of dizziness. Continue current BP meds and monitor BP today.   Napoleon Form 11/29/2011, 6:56 AM

## 2011-11-29 NOTE — Progress Notes (Signed)
Reviewed plan of care with patient again and she is still not comfortable getting out of the bed. She feels that the pain is too unbearable to get out of bed and make it to the bathroom. Patient does not have lochia and requests bedside commode because of her urinary urgency while on Lasix (once catheter is d/c). Patient also stated using a walker at home. Talked about calling doctor about taking Ambien, getting milk of magnesia and her pain not being relieved past a /10. Made goal of possibly taking out catheter near end of my shift-will evaluate with MD. Pt instructed to use CPAP while sleeping and can take off CO2 sensor that is required while on PCA. Pt also wants room to cool down as this helps her with her breathing. Advised that we do not want it too cold in order to help baby regulate his temperature.

## 2011-11-29 NOTE — Progress Notes (Signed)
UR chart review completed.  

## 2011-11-29 NOTE — Progress Notes (Signed)
Went into room to retrieve items I left and patient was sitting on the edge of her bedside and tolerating well. She said she stood up and walked to the couch with the assistance of her mother-pt did not call out for assistance or tell me she was wanting to try to get out of bed.

## 2011-11-30 LAB — TYPE AND SCREEN
Antibody Screen: NEGATIVE
Unit division: 0

## 2011-11-30 MED ORDER — HYDROMORPHONE HCL 2 MG PO TABS: 2.0000 mg | ORAL_TABLET | Freq: Four times a day (QID) | ORAL | Status: AC | PRN

## 2011-11-30 MED ORDER — KETOROLAC TROMETHAMINE 10 MG PO TABS
10.0000 mg | ORAL_TABLET | Freq: Four times a day (QID) | ORAL | Status: DC
  Administered 2011-11-30 – 2011-12-01 (×3): 10 mg via ORAL
  Filled 2011-11-30 (×11): qty 1

## 2011-11-30 MED ORDER — SENNOSIDES-DOCUSATE SODIUM 8.6-50 MG PO TABS: 2.0000 | ORAL_TABLET | Freq: Every day | ORAL | Status: AC

## 2011-11-30 MED ORDER — PREDNISONE 10 MG PO TABS: ORAL_TABLET | ORAL | Status: DC

## 2011-11-30 MED ORDER — PREDNISONE 20 MG PO TABS
40.0000 mg | ORAL_TABLET | Freq: Once | ORAL | Status: AC
  Administered 2011-11-30: 40 mg via ORAL
  Filled 2011-11-30: qty 2

## 2011-11-30 MED ORDER — FERROUS SULFATE 325 (65 FE) MG PO TABS: 325.0000 mg | ORAL_TABLET | Freq: Every day | ORAL | Status: DC

## 2011-11-30 NOTE — Discharge Summary (Signed)
  Obstetric Discharge Summary Reason for Admission: cesarean section Prenatal Procedures: ultrasound Intrapartum Procedures: cesarean: low cervical, transverse Postpartum Procedures: none Complications-Operative and Postpartum: asthma treatment HGB  Date Value Range Status  08/27/2011 9.0* 11.6 - 15.9 g/dL Final     Hemoglobin  Date Value Range Status  11/29/2011 9.4* 12.0 - 15.0 g/dL Final     HCT  Date Value Range Status  11/29/2011 31.2* 36.0 - 46.0 % Final  08/27/2011 29.3* 34.8 - 46.6 % Final    Physical Exam:  General: alert, cooperative, appears stated age, no distress and morbidly obese, CV with tachycardia to 110s. Lochia: appropriate Uterine Fundus: firm though difficult to palpate through body habitus Incision: healing well DVT Evaluation: No evidence of DVT seen on physical exam. No cords or calf tenderness. No significant calf/ankle edema beyond pt's typical trace edema.  Discharge Diagnoses: Term Pregnancy-delivered and iron deficiency anemia, asthma, HTN, fibromyalgia, morbid obesity, chronic pain  Discharge Information: Date: 11/30/2011 Activity: pelvic rest and per discharge instructions Diet: Heart healthy and low sodium Medications: Colace, Iron and toradol and dilaudid, resume home meds as indicated in medication reconciliation Condition: stable Instructions: refer to practice specific booklet Discharge to: home   Newborn Data: Live born female  Birth Weight: 6 lb 7.4 oz (2930 g) APGAR: 9, 9  Home with baby. - Please follow up with Pulmonology next week  - f[facility] with [facility] at 6 weeks post-partum - F/u with Dr. Stefano Gaul after that per pt preference  Simone Curia 11/30/2011, 7:34 AM  Pt seen and examined.  Agree with above. Elsie Lincoln Dr. Sherene Sires recommended short prednisone taper. 40 mg today, then 30, 20, 10 for next 3 days  Alonza Knisley 11/30/2011 9:19 AM

## 2011-12-01 NOTE — Progress Notes (Signed)
Post Partum Day 3 Subjective: up ad lib, voiding, tolerating PO, + flatus and +bowel movement.  Objective: Blood pressure 124/76, pulse 109, temperature 98.3 F (36.8 C), temperature source Oral, resp. rate 20, height 5\' 2"  (1.575 m), weight 123.605 kg (272 lb 8 oz), last menstrual period 02/21/2011, SpO2 100.00%, unknown if currently breastfeeding.  Physical Exam:  General: alert, cooperative and appears stated age CVS:  RRR, without murmur, gallops, or rubs Lungs:  CTA bilat ABD:  +BSx4, normal Lochia: appropriate Uterine Fundus: firm Incision: no significant drainage, wound vac in place DVT Evaluation: No evidence of DVT seen on physical exam; trace bilat pedal swelling Negative Homan's sign.  Basename 11/29/11 0515  HGB 9.4*  HCT 31.2*    Assessment/Plan: Discharge home   LOS: 5 days   [facility] 12/01/2011, 7:33 AM

## 2011-12-03 ENCOUNTER — Telehealth: Payer: Self-pay | Admitting: Medical

## 2011-12-03 NOTE — Telephone Encounter (Signed)
Received note from call-a-nurse from 12/03/11 @ 12:15 am. Pt wound vac was beeping stating there was "leak." Was told that patient did not go to [facility] as instructed by call-a-nurse. Called patient to confirm wound vac was ok and she states that her mom was able to fix it and it is no longer giving an error message. Patient voices no additional questions or concerns at this time.

## 2011-12-04 ENCOUNTER — Encounter (HOSPITAL_BASED_OUTPATIENT_CLINIC_OR_DEPARTMENT_OTHER): Payer: Self-pay | Admitting: Emergency Medicine

## 2011-12-04 ENCOUNTER — Emergency Department (HOSPITAL_BASED_OUTPATIENT_CLINIC_OR_DEPARTMENT_OTHER)
Admission: EM | Admit: 2011-12-04 | Discharge: 2011-12-04 | Disposition: A | Discharge status: Home / Self Care | Payer: Medicaid Other | Attending: Emergency Medicine | Admitting: Emergency Medicine

## 2011-12-04 DIAGNOSIS — J45909 Unspecified asthma, uncomplicated: Secondary | ICD-10-CM | POA: Insufficient documentation

## 2011-12-04 DIAGNOSIS — Z9981 Dependence on supplemental oxygen: Secondary | ICD-10-CM | POA: Insufficient documentation

## 2011-12-04 DIAGNOSIS — G4730 Sleep apnea, unspecified: Secondary | ICD-10-CM | POA: Insufficient documentation

## 2011-12-04 DIAGNOSIS — Z6841 Body Mass Index (BMI) 40.0 and over, adult: Secondary | ICD-10-CM | POA: Insufficient documentation

## 2011-12-04 DIAGNOSIS — R21 Rash and other nonspecific skin eruption: Secondary | ICD-10-CM | POA: Insufficient documentation

## 2011-12-04 DIAGNOSIS — I10 Essential (primary) hypertension: Secondary | ICD-10-CM | POA: Insufficient documentation

## 2011-12-04 DIAGNOSIS — D649 Anemia, unspecified: Secondary | ICD-10-CM | POA: Insufficient documentation

## 2011-12-04 DIAGNOSIS — IMO0001 Reserved for inherently not codable concepts without codable children: Secondary | ICD-10-CM | POA: Insufficient documentation

## 2011-12-04 MED ORDER — PERMETHRIN 5 % EX CREA: TOPICAL_CREAM | CUTANEOUS | Status: DC

## 2011-12-04 NOTE — ED Notes (Signed)
Pt being treated for scabies, rash improved but then got worse. Pt recently delivered baby and was put on steroids for breathing issues, presents tonight with increasing rash, uticaria

## 2011-12-04 NOTE — ED Provider Notes (Signed)
History     CSN: [id number]  Arrival date & time 12/04/11  0156   First MD Initiated Contact with Patient 12/04/11 0245      Chief Complaint  Patient presents with  . Rash    (Consider location/radiation/quality/duration/timing/severity/associated sxs/prior treatment) HPI This is a 36 year old black female who was treated here August 4 for rash which was diagnosed as scabies. She was treated with a scab site cream x2 applications a week apart. She states the rash is improved significantly after that she is here because she has developed new lesions on her breast which she states are like the lesions she had before. There not as severe as previously but are moderately pruritic.  Past Medical History  Diagnosis Date  . Hypertension   . Migraine   . Anxiety   . Abnormal Pap smear   . Anemia   . Fibroids   . Fibromyalgia     degenerative disc disease  . Depression   . Sleep apnea   . Shortness of breath   . On home oxygen therapy     2 liters [place]  . PONV (postoperative nausea and vomiting)   . Low iron     pt states low iron-for Feraheme today in [facility]  . Morbid obesity with BMI of 45.0-49.9, adult   . Asthma     recent admission with exacerbation of asthma 09/15/11    Past Surgical History  Procedure Date  . Wisdom tooth extraction   . Mandible surgery 2008  . Carpal tunnel release   . Cesarean section     1994, 1996   . Cesarean section 11/28/2011    Procedure: CESAREAN SECTION;  Surgeon: Adam Phenix, MD;  Location: [facility];  Service: Obstetrics;  Laterality: N/A;    Family History  Problem Relation Age of Onset  . Sickle cell trait Maternal Aunt   . Sickle cell anemia Other     History  Substance Use Topics  . Smoking status: Never Smoker   . Smokeless tobacco: Never Used  . Alcohol Use: No    OB History    Grav Para Term Preterm Abortions TAB SAB Ect Mult Living   3 3 3       3       Review of Systems  All other systems reviewed and are  negative.    Allergies  Latex and Morphine and related  Home Medications   Current Outpatient Rx  Name Route Sig Dispense Refill  . ALBUTEROL SULFATE HFA 108 (90 BASE) MCG/ACT IN AERS Inhalation Inhale 2 puffs into the lungs every 6 (six) hours as needed. As needed for asthma/shortness of breath    . AMLODIPINE BESYLATE 10 MG PO TABS Oral Take 1 tablet (10 mg total) by mouth daily. 31 tablet 6  . BUDESONIDE 0.5 MG/2ML IN SUSP Nebulization Take 2 mLs (0.5 mg total) by nebulization 2 (two) times daily. 60 mL 3  . CYCLOBENZAPRINE HCL 10 MG PO TABS Oral Take 10 mg by mouth 3 (three) times daily as needed.    [name] DIPHENHYDRAMINE-APAP (SLEEP) 25-500 MG PO TABS Oral Take 1 tablet by mouth at bedtime as needed. Takes for sleep    . FERROUS SULFATE 325 (65 FE) MG PO TABS Oral Take 1 tablet (325 mg total) by mouth daily with breakfast. 30 tablet 1  . FLUTICASONE PROPIONATE 50 MCG/ACT NA SUSP Nasal Place 2 sprays into the nose 2 (two) times daily. 16 g 12  . FUROSEMIDE 40 MG PO  TABS Oral Take 1 tablet (40 mg total) by mouth daily. 10 tablet 1  . HYDROMORPHONE HCL 2 MG PO TABS Oral Take 1 tablet (2 mg total) by mouth every 6 (six) hours as needed. 30 tablet 0  . IPRATROPIUM-ALBUTEROL 0.5-2.5 (3) MG/3ML IN SOLN Nebulization Take 3 mLs by nebulization every 4 (four) hours as needed.    [name] LORATADINE 10 MG PO TABS Oral Take 1 tablet (10 mg total) by mouth daily. 31 tablet 12  . METOPROLOL TARTRATE 25 MG PO TABS Oral Take 1 tablet (25 mg total) by mouth 2 (two) times daily. 360 tablet 6  . OXYCODONE-ACETAMINOPHEN 5-325 MG PO TABS Oral Take 3 tablets by mouth every 8 (eight) hours as needed.    [name] POTASSIUM CHLORIDE CRYS ER 20 MEQ PO TBCR Oral Take 1 tablet (20 mEq total) by mouth daily. 10 tablet 1  . PREDNISONE 10 MG PO TABS  3 tablets by mouth on 12/01/11 (Saturday), 2 on Sunday, 1 on Monday 6 tablet 0  . PRENATAL MULTIVITAMIN CH Oral Take 1 tablet by mouth every morning.    Bernadette Hoit SODIUM  8.6-50 MG PO TABS Oral Take 2 tablets by mouth at bedtime. 30 tablet 0    BP 128/83  Pulse 109  Temp 98.8 F (37.1 C)  Resp 18  SpO2 100%  LMP 02/21/2011  Breastfeeding? Yes  Physical Exam General: Well-developed, well-nourished female in no acute distress; appearance consistent with age of record HENT: normocephalic, atraumatic Eyes: Normal per Neck: supple Heart: regular rate and rhythm Lungs: Normal respiratory effort and excursion, wears oxygen around-the-clock Abdomen: soft; obese Extremities: No deformity; full range of motion Neurologic: Awake, alert and oriented; motor function intact in all extremities and symmetric; no facial droop Skin: Diffuse maculopapular, hyperpigmented rash which the patient indicates are healing lesions; multiple erythematous punctate lesions of the breast which the patient indicates are the new lesions. Psychiatric: Normal mood and affect    ED Course  Procedures (including critical care time)     MDM  The patient's examination is not present for scabies but given the recent improvement with scabies side we will retreat her. We will also refer to dermatology as this may represent an alternative diagnosis. The patient is in the process of finding a new dermatologist as her previous dermatologist no longer takes Medicaid.        Hanley Seamen, MD 12/04/11 [phone]

## 2011-12-05 ENCOUNTER — Encounter (HOSPITAL_COMMUNITY): Payer: Self-pay | Admitting: *Deleted

## 2011-12-05 ENCOUNTER — Inpatient Hospital Stay (HOSPITAL_COMMUNITY)
Admission: AD | Admit: 2011-12-05 | Discharge: 2011-12-05 | Disposition: A | Discharge status: Home / Self Care | Payer: Medicaid Other | Source: Ambulatory Visit | Attending: Obstetrics & Gynecology | Admitting: Obstetrics & Gynecology

## 2011-12-05 DIAGNOSIS — Z5189 Encounter for other specified aftercare: Secondary | ICD-10-CM

## 2011-12-05 DIAGNOSIS — O909 Complication of the puerperium, unspecified: Secondary | ICD-10-CM | POA: Insufficient documentation

## 2011-12-05 DIAGNOSIS — Z98891 History of uterine scar from previous surgery: Secondary | ICD-10-CM

## 2011-12-05 MED ORDER — CEPHALEXIN 500 MG PO CAPS: 500.0000 mg | ORAL_CAPSULE | Freq: Three times a day (TID) | ORAL | Status: AC

## 2011-12-05 MED ORDER — CEPHALEXIN 500 MG PO CAPS
500.0000 mg | ORAL_CAPSULE | Freq: Once | ORAL | Status: AC
  Administered 2011-12-05: 500 mg via ORAL
  Filled 2011-12-05: qty 1

## 2011-12-05 NOTE — Progress Notes (Signed)
Pt presents with portable  O2 via nasal canular infusign at 2L/min.pt here for evaluation of c-section dressing Wound Vac

## 2011-12-05 NOTE — MAU Provider Note (Signed)
Chief Complaint:  Post-op Problem    First Provider Initiated Contact with Patient 12/05/11 2133      Cheyenne Arias is  36 y.o. [id number].  Patient's last menstrual period was 02/21/2011..    She presents complaining of Post-op Problem  Pt is s/p RLTCS on 11/28/11. States [facility] nurse came to house today, checked would vac and instructed pt to come to MAU for further evaluation. Denies pain or draining from incision site or DRSG.   Obstetrical/Gynecological History: OB History    Grav Para Term Preterm Abortions TAB SAB Ect Mult Living   3 3 3       3       Past Medical History: Past Medical History  Diagnosis Date  . Hypertension   . Migraine   . Anxiety   . Abnormal Pap smear   . Anemia   . Fibroids   . Fibromyalgia     degenerative disc disease  . Depression   . Sleep apnea   . Shortness of breath   . On home oxygen therapy     2 liters [place]  . PONV (postoperative nausea and vomiting)   . Low iron     pt states low iron-for Feraheme today in MAU  . Morbid obesity with BMI of 45.0-49.9, adult   . Asthma     recent admission with exacerbation of asthma 09/15/11    Past Surgical History: Past Surgical History  Procedure Date  . Wisdom tooth extraction   . Mandible surgery 2008  . Carpal tunnel release   . Cesarean section     1994, 1996   . Cesarean section 11/28/2011    Procedure: CESAREAN SECTION;  Surgeon: Adam Phenix, MD;  Location: [facility] ORS;  Service: Obstetrics;  Laterality: N/A;    Family History: Family History  Problem Relation Age of Onset  . Sickle cell trait Maternal Aunt   . Sickle cell anemia Other     Social History: History  Substance Use Topics  . Smoking status: Never Smoker   . Smokeless tobacco: Never Used  . Alcohol Use: No    Allergies:  Allergies  Allergen Reactions  . Latex Itching and Swelling    Blisters  . Morphine And Related Itching    Prescriptions prior to admission  Medication Sig Dispense Refill  . albuterol  (PROVENTIL HFA;VENTOLIN HFA) 108 (90 BASE) MCG/ACT inhaler Inhale 2 puffs into the lungs every 6 (six) hours as needed. for asthma/shortness of breath      . amLODipine (NORVASC) 10 MG tablet Take 1 tablet (10 mg total) by mouth daily.  31 tablet  6  . budesonide (PULMICORT) 0.5 MG/2ML nebulizer solution Take 2 mLs (0.5 mg total) by nebulization 2 (two) times daily.  60 mL  3  . cyclobenzaprine (FLEXERIL) 10 MG tablet Take 10 mg by mouth 3 (three) times daily as needed. For muscle spasms      . diphenhydramine-acetaminophen (TYLENOL PM) 25-500 MG TABS Take 1 tablet by mouth at bedtime as needed. for sleep      . ferrous sulfate 325 (65 FE) MG tablet Take 1 tablet (325 mg total) by mouth daily with breakfast.  30 tablet  1  . fluticasone (FLONASE) 50 MCG/ACT nasal spray Place 2 sprays into the nose 2 (two) times daily.  16 g  12  . furosemide (LASIX) 40 MG tablet Take 1 tablet (40 mg total) by mouth daily.  10 tablet  1  . HYDROmorphone (DILAUDID) 2 MG tablet  Take 1 tablet (2 mg total) by mouth every 6 (six) hours as needed.  30 tablet  0  . ipratropium-albuterol (DUONEB) 0.5-2.5 (3) MG/3ML SOLN Take 3 mLs by nebulization every 4 (four) hours as needed. For asthma symptoms      . loratadine (CLARITIN) 10 MG tablet Take 1 tablet (10 mg total) by mouth daily.  31 tablet  12  . metoprolol tartrate (LOPRESSOR) 25 MG tablet Take 1 tablet (25 mg total) by mouth 2 (two) times daily.  360 tablet  6  . oxyCODONE-acetaminophen (PERCOCET) 5-325 MG per tablet Take 3 tablets by mouth every 8 (eight) hours as needed. For pain      . potassium chloride SA (K-DUR,KLOR-CON) 20 MEQ tablet Take 1 tablet (20 mEq total) by mouth daily.  10 tablet  1  . Prenatal Vit-Fe Fumarate-FA (PRENATAL MULTIVITAMIN) TABS Take 1 tablet by mouth every morning.      . senna-docusate (SENOKOT-S) 8.6-50 MG per tablet Take 2 tablets by mouth at bedtime.  30 tablet  0    Review of Systems - History obtained from chart review and the  patient General ROS: negative for - chills or fever Breast ROS: negative Respiratory ROS: no cough, shortness of breath, or wheezing Cardiovascular ROS: no chest pain or dyspnea on exertion positive for - dyspnea on exertion and sleep apnea Gastrointestinal ROS: no abdominal pain, change in bowel habits, or black or bloody stools positive for - loss of suction from wound vac Genito-Urinary ROS: no dysuria, trouble voiding, or hematuria  Physical Exam   Blood pressure 132/76, pulse 125, temperature 98 F (36.7 C), temperature source Oral, resp. rate 20, height 5\' 2"  (1.575 m), weight 275 lb 6.4 oz (124.921 kg), last menstrual period 02/21/2011, SpO2 100.00%, not currently breastfeeding.  General: General appearance - alert, well appearing, and in no distress, oriented to person, place, and time, overweight and acyanotic, in no respiratory distress Mental status - alert, oriented to person, place, and time, normal mood, behavior, speech, dress, motor activity, and thought processes Abdomen - wound vac without suction, removed without difficulty. Incision: well healed without s/s of infections. 2cm erythematous area noted on pannus, warm to touch, tender. Peau d' orange skin changes Extremities - pedal edema 1 + Focused Gynecological Exam: examination not indicated  Assessment: 1. Visit for wound check   2. S/P cesarean section    Plan: Discharge home Rx Keflex for early cellulitis RTC on Friday as scheduled for recheck  Destanie Tibbetts E. 12/05/2011,9:42 PM

## 2011-12-05 NOTE — Progress Notes (Signed)
Pt state she took some pain medication about 1500

## 2011-12-05 NOTE — MAU Note (Signed)
Pt post C/S 8/14, wound vac placed.  Pt D/c home 8/17.  Wound vac is leaking and not properly sealed.

## 2011-12-06 ENCOUNTER — Encounter (HOSPITAL_COMMUNITY)
Admission: AD | Disposition: A | Discharge status: Home / Self Care | Payer: Self-pay | Source: Ambulatory Visit | Attending: Obstetrics and Gynecology

## 2011-12-06 SURGERY — Surgical Case
Anesthesia: Regional | Site: Abdomen

## 2011-12-07 ENCOUNTER — Ambulatory Visit (INDEPENDENT_AMBULATORY_CARE_PROVIDER_SITE_OTHER): Payer: Medicaid Other | Admitting: Medical

## 2011-12-07 VITALS — BP 153/97 | HR 117 | Temp 98.5°F | Resp 12 | Ht 62.0 in | Wt 270.0 lb

## 2011-12-07 DIAGNOSIS — Z98891 History of uterine scar from previous surgery: Secondary | ICD-10-CM

## 2011-12-07 DIAGNOSIS — Z09 Encounter for follow-up examination after completed treatment for conditions other than malignant neoplasm: Secondary | ICD-10-CM

## 2011-12-07 NOTE — Progress Notes (Signed)
Patient ID: Cheyenne Arias, female   DOB: 09-20-1975, 36 y.o.   MRN: [id number]  Patient presents today for wound check s/p C-section on 11/28/11. Patient went to MAU 12/05/11 because wound vac was "leaking" and the wound vac was removed at that time. Incision today looks well healed without surrounding erythema or other signs of infection. Moderately tender to palpation during exam.   Discussed patient with Dr. Debroah Loop, who also examined the wound during this visit.   Patient will follow-up for 6 week PP check on 12/27/11 or sooner if needed.   Judith Blonder CMA

## 2011-12-20 ENCOUNTER — Encounter: Payer: Medicaid Other | Admitting: Obstetrics & Gynecology

## 2011-12-27 ENCOUNTER — Ambulatory Visit: Payer: Medicaid Other | Admitting: Family Medicine

## 2012-01-07 ENCOUNTER — Ambulatory Visit: Payer: Medicaid Other | Admitting: Pulmonary Disease

## 2012-01-10 ENCOUNTER — Ambulatory Visit (INDEPENDENT_AMBULATORY_CARE_PROVIDER_SITE_OTHER): Payer: Medicaid Other | Admitting: Pulmonary Disease

## 2012-01-10 ENCOUNTER — Ambulatory Visit (INDEPENDENT_AMBULATORY_CARE_PROVIDER_SITE_OTHER)
Admission: RE | Admit: 2012-01-10 | Discharge: 2012-01-10 | Disposition: A | Discharge status: Home / Self Care | Payer: Medicaid Other | Source: Ambulatory Visit | Attending: Pulmonary Disease | Admitting: Pulmonary Disease

## 2012-01-10 ENCOUNTER — Encounter: Payer: Self-pay | Admitting: Pulmonary Disease

## 2012-01-10 VITALS — BP 112/78 | HR 100 | Temp 98.3°F | Ht 62.0 in | Wt 264.8 lb

## 2012-01-10 DIAGNOSIS — J45909 Unspecified asthma, uncomplicated: Secondary | ICD-10-CM

## 2012-01-10 DIAGNOSIS — G4733 Obstructive sleep apnea (adult) (pediatric): Secondary | ICD-10-CM

## 2012-01-10 DIAGNOSIS — R0902 Hypoxemia: Secondary | ICD-10-CM

## 2012-01-10 MED ORDER — PREDNISONE 10 MG PO TABS: ORAL_TABLET | ORAL | Status: DC

## 2012-01-10 MED ORDER — MONTELUKAST SODIUM 10 MG PO TABS: 10.0000 mg | ORAL_TABLET | Freq: Every day | ORAL | Status: DC

## 2012-01-10 NOTE — Assessment & Plan Note (Signed)
She has recurrent asthma exacerbation.  Will course of prednisone.  Will add singulair.  She is to continue pulmicort, and duoneb.  Will check chest xray.  She will need to have RAST and IgE when more stable.

## 2012-01-10 NOTE — Patient Instructions (Addendum)
Prednisone 10 mg pill>>4 pills for 2 days, 3 pills for 2 days, 2 pills for 2 days, 1 pill for 2 days, 1/2 pill for 2 days Singulair 10 mg>> one pill nightly before bedtime Chest xray today>>will call with results Follow up in 3 to 4 weeks

## 2012-01-10 NOTE — Progress Notes (Signed)
Chief Complaint  Patient presents with  . Follow-up    for asthma. Pt states no change in breathing since last OV. Pt states that she is using her medications as directed but is getting no better. pt c/o wheezing, productive cough with "chunky" mucus and right back pain.    History of Present Illness: Cheyenne Arias is a 36 y.o. female with asthma, OSA, OHS.  She is 6 weeks post C section.  She is not breast feeding.  She is still having lots of trouble with her breathing.  She has cough with thick, brown to green sputum.  She is also having wheeze.  She feels better when on prednisone.  She denies fever, sinus congestion, or sore throat.  She is using her pulmicort several times per day, and needs to use her duoneb at least every 4 hours.  She is doing well with CPAP.  She remains on oxygen 24/7.  Tests: PSG 03/27/11>>AHI 10.9 Echo 09/20/11>>mod LVH, EF 65 to 70%, small/mod pericardial effusion  Doppler legs 09/21/11>>no DVT CT chest 09/22/11>>no PE, Lt base ATX/ASD, borderline cardiomegaly Spirometry 01/10/12>>FEV1 1.72 (71%), FEV1% 77  Past Medical History  Diagnosis Date  . Hypertension   . Migraine   . Anxiety   . Abnormal Pap smear   . Anemia   . Fibroids   . Fibromyalgia     degenerative disc disease  . Depression   . Sleep apnea   . Shortness of breath   . On home oxygen therapy     2 liters [place]  . PONV (postoperative nausea and vomiting)   . Low iron     pt states low iron-for Feraheme today in MAU  . Morbid obesity with BMI of 45.0-49.9, adult   . Asthma     recent admission with exacerbation of asthma 09/15/11    Past Surgical History  Procedure Date  . Wisdom tooth extraction   . Mandible surgery 2008  . Carpal tunnel release   . Cesarean section     1994, 1996   . Cesarean section 11/28/2011    Procedure: CESAREAN SECTION;  Surgeon: Adam Phenix, MD;  Location: [facility] ORS;  Service: Obstetrics;  Laterality: N/A;    Outpatient Encounter Prescriptions as  of 01/10/2012  Medication Sig Dispense Refill  . albuterol (PROVENTIL HFA;VENTOLIN HFA) 108 (90 BASE) MCG/ACT inhaler Inhale 2 puffs into the lungs every 6 (six) hours as needed. for asthma/shortness of breath      . amLODipine (NORVASC) 10 MG tablet Take 1 tablet (10 mg total) by mouth daily.  31 tablet  6  . budesonide (PULMICORT) 0.5 MG/2ML nebulizer solution Take 2 mLs (0.5 mg total) by nebulization 2 (two) times daily.  60 mL  3  . cyclobenzaprine (FLEXERIL) 10 MG tablet Take 10 mg by mouth 3 (three) times daily as needed. For muscle spasms      . diphenhydramine-acetaminophen (TYLENOL PM) 25-500 MG TABS Take 1 tablet by mouth at bedtime as needed. for sleep      . doxycycline (VIBRAMYCIN) 100 MG capsule Take 100 mg by mouth 2 (two) times daily.      . ferrous sulfate 325 (65 FE) MG tablet Take 1 tablet (325 mg total) by mouth daily with breakfast.  30 tablet  1  . fluticasone (FLONASE) 50 MCG/ACT nasal spray Place 2 sprays into the nose 2 (two) times daily.  16 g  12  . furosemide (LASIX) 40 MG tablet Take 1 tablet (40 mg total) by  mouth daily.  10 tablet  1  . ipratropium-albuterol (DUONEB) 0.5-2.5 (3) MG/3ML SOLN Take 3 mLs by nebulization every 4 (four) hours as needed. For asthma symptoms      . loratadine (CLARITIN) 10 MG tablet Take 1 tablet (10 mg total) by mouth daily.  31 tablet  12  . metoprolol tartrate (LOPRESSOR) 25 MG tablet Take 1 tablet (25 mg total) by mouth 2 (two) times daily.  360 tablet  6  . oxyCODONE-acetaminophen (PERCOCET) 5-325 MG per tablet Take 3 tablets by mouth every 8 (eight) hours as needed. For pain      . potassium chloride SA (K-DUR,KLOR-CON) 20 MEQ tablet Take 1 tablet (20 mEq total) by mouth daily.  10 tablet  1  . Prenatal Vit-Fe Fumarate-FA (PRENATAL MULTIVITAMIN) TABS Take 1 tablet by mouth every morning.      . senna-docusate (SENOKOT-S) 8.6-50 MG per tablet Take 2 tablets by mouth at bedtime.  30 tablet  0    Allergies  Allergen Reactions  . Latex  Itching and Swelling    Blisters  . Morphine And Related Itching    Physical Exam:  Blood pressure 112/78, pulse 100, temperature 98.3 F (36.8 C), temperature source Oral, height 5\' 2"  (1.575 m), weight 264 lb 12.8 oz (120.112 kg), SpO2 99.00%.  Body mass index is 48.43 kg/(m^2). Wt Readings from Last 2 Encounters:  01/10/12 264 lb 12.8 oz (120.112 kg)  12/07/11 270 lb (122.471 kg)    General - no distress, using oxygen HEENT - no sinus tenderness, no oral exudate, no LAN Cardiac - s1s2 tachycardic, no murmur Chest - prolonged exhalation, b/l expiratory wheezing Abd - obese, soft, non-tender Ext - no edema Neuro - normal strength Psych - normal mood behavior   Assessment/Plan:  [name], MD [facility] Pager:  [phone] 01/10/2012, 2:53 PM

## 2012-01-10 NOTE — Assessment & Plan Note (Signed)
She is to continue oxygen 24/7 for now.  Will re-assess O2 needs after asthma is more stable.     

## 2012-01-10 NOTE — Assessment & Plan Note (Signed)
She is to to continue CPAP at night.  Will need to assess her download when more stable.

## 2012-01-14 ENCOUNTER — Telehealth: Payer: Self-pay | Admitting: Pulmonary Disease

## 2012-01-14 NOTE — Telephone Encounter (Signed)
Lm for pt to call back

## 2012-01-14 NOTE — Telephone Encounter (Signed)
Pt called again wanting results.  Call her back @ [phone] Leanora Ivanoff

## 2012-01-14 NOTE — Telephone Encounter (Signed)
Dg Chest 2 View  01/10/2012  *RADIOLOGY REPORT*  Clinical Data: Increased dyspnea, chest pain, cough, hypertension, asthma  CHEST - 2 VIEW  Comparison: 09/19/2011  Findings: Borderline enlargement of cardiac silhouette. Mediastinal contours and pulmonary vascularity normal. Lungs clear. No pleural effusion or pneumothorax. No acute osseous findings.  IMPRESSION: Borderline enlargement of cardiac silhouette. No acute abnormalities.   Original Report Authenticated By: MARK A. BOLES, M.D.    Will have my nurse inform pt that CXR was okay. No evidence for pneumonia or fluid in the lungs. No change to current treatment plan.   

## 2012-01-14 NOTE — Telephone Encounter (Signed)
Dg Chest 2 View  01/10/2012  *RADIOLOGY REPORT*  Clinical Data: Increased dyspnea, chest pain, cough, hypertension, asthma  CHEST - 2 VIEW  Comparison: 09/19/2011  Findings: Borderline enlargement of cardiac silhouette. Mediastinal contours and pulmonary vascularity normal. Lungs clear. No pleural effusion or pneumothorax. No acute osseous findings.  IMPRESSION: Borderline enlargement of cardiac silhouette. No acute abnormalities.   Original Report Authenticated By: Lollie Marrow, M.D.    Will have my nurse inform pt that CXR was okay. No evidence for pneumonia or fluid in the lungs. No change to current treatment plan.

## 2012-01-15 ENCOUNTER — Telehealth: Payer: Self-pay | Admitting: Pulmonary Disease

## 2012-01-15 NOTE — Telephone Encounter (Signed)
Pt returned call. Cheyenne Arias  

## 2012-01-15 NOTE — Telephone Encounter (Signed)
Member ID# [id number] L.  Auth # [id number].  Montelukast has been APPROVED for 1 year starting 01/15/2012 through 01/14/2013. Pharmacy notified and they will let the pt know when it is ready for pick up.

## 2012-01-16 NOTE — Telephone Encounter (Signed)
I spoke with patient about results and she verbalized understanding and had no questions 

## 2012-02-03 ENCOUNTER — Telehealth: Payer: Self-pay | Admitting: Pulmonary Disease

## 2012-02-03 NOTE — Telephone Encounter (Signed)
Cough & wheezing - sputum like' green spaghetti'  Rx Zpak Prednisone 10 mg tabs Take 4 tabs  daily with food x 4 days, then 3 tabs daily x 4 days, then 2 tabs daily x 4 days, then 1 tab daily x4 days then stop. #40  Has appt for next week

## 2012-02-07 ENCOUNTER — Emergency Department (HOSPITAL_COMMUNITY)
Admission: EM | Admit: 2012-02-07 | Discharge: 2012-02-07 | Disposition: A | Discharge status: Home / Self Care | Payer: No Typology Code available for payment source | Attending: Emergency Medicine | Admitting: Emergency Medicine

## 2012-02-07 ENCOUNTER — Encounter (HOSPITAL_COMMUNITY): Payer: Self-pay | Admitting: Emergency Medicine

## 2012-02-07 DIAGNOSIS — F341 Dysthymic disorder: Secondary | ICD-10-CM | POA: Insufficient documentation

## 2012-02-07 DIAGNOSIS — IMO0002 Reserved for concepts with insufficient information to code with codable children: Secondary | ICD-10-CM | POA: Insufficient documentation

## 2012-02-07 DIAGNOSIS — I10 Essential (primary) hypertension: Secondary | ICD-10-CM | POA: Insufficient documentation

## 2012-02-07 DIAGNOSIS — T148XXA Other injury of unspecified body region, initial encounter: Secondary | ICD-10-CM

## 2012-02-07 DIAGNOSIS — J45909 Unspecified asthma, uncomplicated: Secondary | ICD-10-CM | POA: Insufficient documentation

## 2012-02-07 DIAGNOSIS — Z79899 Other long term (current) drug therapy: Secondary | ICD-10-CM | POA: Insufficient documentation

## 2012-02-07 DIAGNOSIS — Y9389 Activity, other specified: Secondary | ICD-10-CM | POA: Insufficient documentation

## 2012-02-07 DIAGNOSIS — D649 Anemia, unspecified: Secondary | ICD-10-CM | POA: Insufficient documentation

## 2012-02-07 MED ORDER — IBUPROFEN 800 MG PO TABS: 800.0000 mg | ORAL_TABLET | Freq: Three times a day (TID) | ORAL | Status: DC

## 2012-02-07 MED ORDER — OXYCODONE-ACETAMINOPHEN 5-325 MG PO TABS: 2.0000 | ORAL_TABLET | ORAL | Status: DC | PRN

## 2012-02-07 MED ORDER — IBUPROFEN 800 MG PO TABS
800.0000 mg | ORAL_TABLET | Freq: Once | ORAL | Status: AC
  Administered 2012-02-07: 800 mg via ORAL
  Filled 2012-02-07: qty 1

## 2012-02-07 MED ORDER — LORAZEPAM 1 MG PO TABS: 1.0000 mg | ORAL_TABLET | Freq: Three times a day (TID) | ORAL | Status: DC | PRN
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
## 2012-02-07 NOTE — ED Notes (Signed)
Pt ambulated to car w/ baby to get baby wipes

## 2012-02-09 NOTE — ED Provider Notes (Signed)
Medical screening examination/treatment/procedure(s) were performed by non-physician practitioner and as supervising physician I was immediately available for consultation/collaboration.   Rolan Bucco, MD 02/09/12 [phone]

## 2012-02-13 ENCOUNTER — Emergency Department (HOSPITAL_BASED_OUTPATIENT_CLINIC_OR_DEPARTMENT_OTHER)
Admission: EM | Admit: 2012-02-13 | Discharge: 2012-02-13 | Disposition: A | Discharge status: Home / Self Care | Payer: Medicaid Other | Attending: Emergency Medicine | Admitting: Emergency Medicine

## 2012-02-13 ENCOUNTER — Encounter (HOSPITAL_BASED_OUTPATIENT_CLINIC_OR_DEPARTMENT_OTHER): Payer: Self-pay | Admitting: *Deleted

## 2012-02-13 ENCOUNTER — Encounter: Payer: Self-pay | Admitting: Pulmonary Disease

## 2012-02-13 ENCOUNTER — Other Ambulatory Visit (INDEPENDENT_AMBULATORY_CARE_PROVIDER_SITE_OTHER): Payer: Medicaid Other

## 2012-02-13 ENCOUNTER — Ambulatory Visit (INDEPENDENT_AMBULATORY_CARE_PROVIDER_SITE_OTHER): Payer: Medicaid Other | Admitting: Pulmonary Disease

## 2012-02-13 VITALS — BP 122/64 | HR 105 | Temp 98.0°F | Ht 62.0 in | Wt 268.0 lb

## 2012-02-13 DIAGNOSIS — I10 Essential (primary) hypertension: Secondary | ICD-10-CM | POA: Insufficient documentation

## 2012-02-13 DIAGNOSIS — F3289 Other specified depressive episodes: Secondary | ICD-10-CM | POA: Insufficient documentation

## 2012-02-13 DIAGNOSIS — J45909 Unspecified asthma, uncomplicated: Secondary | ICD-10-CM

## 2012-02-13 DIAGNOSIS — Z8739 Personal history of other diseases of the musculoskeletal system and connective tissue: Secondary | ICD-10-CM | POA: Insufficient documentation

## 2012-02-13 DIAGNOSIS — IMO0002 Reserved for concepts with insufficient information to code with codable children: Secondary | ICD-10-CM

## 2012-02-13 DIAGNOSIS — J4550 Severe persistent asthma, uncomplicated: Secondary | ICD-10-CM

## 2012-02-13 DIAGNOSIS — F329 Major depressive disorder, single episode, unspecified: Secondary | ICD-10-CM | POA: Insufficient documentation

## 2012-02-13 DIAGNOSIS — E876 Hypokalemia: Secondary | ICD-10-CM

## 2012-02-13 DIAGNOSIS — IMO0001 Reserved for inherently not codable concepts without codable children: Secondary | ICD-10-CM | POA: Insufficient documentation

## 2012-02-13 DIAGNOSIS — G43909 Migraine, unspecified, not intractable, without status migrainosus: Secondary | ICD-10-CM | POA: Insufficient documentation

## 2012-02-13 DIAGNOSIS — Z79899 Other long term (current) drug therapy: Secondary | ICD-10-CM | POA: Insufficient documentation

## 2012-02-13 DIAGNOSIS — R0902 Hypoxemia: Secondary | ICD-10-CM

## 2012-02-13 DIAGNOSIS — Z8742 Personal history of other diseases of the female genital tract: Secondary | ICD-10-CM | POA: Insufficient documentation

## 2012-02-13 DIAGNOSIS — Z9981 Dependence on supplemental oxygen: Secondary | ICD-10-CM | POA: Insufficient documentation

## 2012-02-13 DIAGNOSIS — G4733 Obstructive sleep apnea (adult) (pediatric): Secondary | ICD-10-CM

## 2012-02-13 DIAGNOSIS — Z791 Long term (current) use of non-steroidal anti-inflammatories (NSAID): Secondary | ICD-10-CM | POA: Insufficient documentation

## 2012-02-13 DIAGNOSIS — G4730 Sleep apnea, unspecified: Secondary | ICD-10-CM | POA: Insufficient documentation

## 2012-02-13 DIAGNOSIS — M791 Myalgia, unspecified site: Secondary | ICD-10-CM

## 2012-02-13 DIAGNOSIS — D649 Anemia, unspecified: Secondary | ICD-10-CM | POA: Insufficient documentation

## 2012-02-13 DIAGNOSIS — F411 Generalized anxiety disorder: Secondary | ICD-10-CM | POA: Insufficient documentation

## 2012-02-13 LAB — CBC WITH DIFFERENTIAL/PLATELET
Basophils Relative: 0.1 % (ref 0.0–3.0)
Eosinophils Relative: 1 % (ref 0.0–5.0)
HCT: 32.2 % — ABNORMAL LOW (ref 36.0–46.0)
Hemoglobin: 9.7 g/dL — ABNORMAL LOW (ref 12.0–15.0)
Lymphs Abs: 3.1 10*3/uL (ref 0.7–4.0)
MCV: 65.9 fl — ABNORMAL LOW (ref 78.0–100.0)
Monocytes Absolute: 0.9 10*3/uL (ref 0.1–1.0)
Neutro Abs: 14.6 10*3/uL — ABNORMAL HIGH (ref 1.4–7.7)
Platelets: 515 10*3/uL — ABNORMAL HIGH (ref 150.0–400.0)
RBC: 4.89 Mil/uL (ref 3.87–5.11)
WBC: 18.7 10*3/uL (ref 4.5–10.5)

## 2012-02-13 LAB — BASIC METABOLIC PANEL
CO2: 26 mEq/L (ref 19–32)
Calcium: 9.2 mg/dL (ref 8.4–10.5)
Chloride: 100 mEq/L (ref 96–112)
Creatinine, Ser: 0.7 mg/dL (ref 0.50–1.10)
Glucose, Bld: 126 mg/dL — ABNORMAL HIGH (ref 70–99)

## 2012-02-13 MED ORDER — POTASSIUM CHLORIDE CRYS ER 20 MEQ PO TBCR
40.0000 meq | EXTENDED_RELEASE_TABLET | Freq: Once | ORAL | Status: AC
  Administered 2012-02-13: 40 meq via ORAL
  Filled 2012-02-13: qty 2

## 2012-02-13 MED ORDER — HYDROMORPHONE HCL PF 1 MG/ML IJ SOLN
1.0000 mg | Freq: Once | INTRAMUSCULAR | Status: AC
  Administered 2012-02-13: 1 mg via INTRAMUSCULAR
  Filled 2012-02-13: qty 1
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
## 2012-02-13 NOTE — ED Notes (Signed)
Assisted pt to car in wheelchair.  

## 2012-02-13 NOTE — Patient Instructions (Signed)
Lab tests today  Follow up in 2 months 

## 2012-02-13 NOTE — Assessment & Plan Note (Signed)
She is to continue oxygen 24/7 for now.  Will re-assess O2 needs after asthma is more stable.     

## 2012-02-13 NOTE — ED Notes (Signed)
MD with pt  

## 2012-02-13 NOTE — Assessment & Plan Note (Signed)
She has recurrent exacerbation.  She has improved with repeat prednisone and antibiotic therapy.  Will check her RAST, IgE, and CBC with differential.  Will call her with results, and discuss further plans for prednisone.  She is to continue her other regimen.

## 2012-02-13 NOTE — Progress Notes (Signed)
Chief Complaint  Patient presents with  . Follow-up    c/o cough w/ green phlem, wheezing is slightly better, breathing is mbetter today from last week. finshed zpak last week. feels worse when prendisone is at low dose    History of Present Illness: Cheyenne Arias is a 36 y.o. female with asthma, OSA, OHS.  She was started on Zpak and prednisone again 02/03/12.  With this her breathing is improved.  She was having more cough with green sputum and wheezing.  She continues to use pulmicort and duoneb.  She has been using singulair, but is not sure how much this is helping.  She denies fever.  She had neck gland swelling when her symptoms started, but this has resolved.    She is doing well with CPAP.  She remains on oxygen 24/7.  Tests: PSG 03/27/11>>AHI 10.9 Echo 09/20/11>>mod LVH, EF 65 to 70%, small/mod pericardial effusion  Doppler legs 09/21/11>>no DVT CT chest 09/22/11>>no PE, Lt base ATX/ASD, borderline cardiomegaly Spirometry 01/10/12>>FEV1 1.72 (71%), FEV1% 77  Past Medical History  Diagnosis Date  . Hypertension   . Migraine   . Anxiety   . Abnormal Pap smear   . Anemia   . Fibroids   . Fibromyalgia     degenerative disc disease  . Depression   . Sleep apnea   . Shortness of breath   . On home oxygen therapy     2 liters [place]  . PONV (postoperative nausea and vomiting)   . Low iron     pt states low iron-for Feraheme today in [facility]  . Morbid obesity with BMI of 45.0-49.9, adult   . Asthma     recent admission with exacerbation of asthma 09/15/11    Past Surgical History  Procedure Date  . Wisdom tooth extraction   . Mandible surgery 2008  . Carpal tunnel release   . Cesarean section     1994, 1996   . Cesarean section 11/28/2011    Procedure: CESAREAN SECTION;  Surgeon: Adam Phenix, MD;  Location: [facility];  Service: Obstetrics;  Laterality: N/A;    Outpatient Encounter Prescriptions as of 02/13/2012  Medication Sig Dispense Refill  . albuterol (PROVENTIL  HFA;VENTOLIN HFA) 108 (90 BASE) MCG/ACT inhaler Inhale 2 puffs into the lungs every 6 (six) hours as needed. for asthma/shortness of breath      . amLODipine (NORVASC) 10 MG tablet Take 1 tablet (10 mg total) by mouth daily.  31 tablet  6  . budesonide (PULMICORT) 0.5 MG/2ML nebulizer solution Take 2 mLs (0.5 mg total) by nebulization 2 (two) times daily.  60 mL  3  . cyclobenzaprine (FLEXERIL) 10 MG tablet Take 10 mg by mouth 3 (three) times daily as needed. For muscle spasms      . diphenhydramine-acetaminophen (TYLENOL PM) 25-500 MG TABS Take 1 tablet by mouth at bedtime as needed. for sleep      . doxycycline (VIBRAMYCIN) 100 MG capsule Take 100 mg by mouth 2 (two) times daily. Started 01/25/12 X 4 weeks      . ferrous sulfate 325 (65 FE) MG tablet Take 1 tablet (325 mg total) by mouth daily with breakfast.  30 tablet  1  . fluticasone (FLONASE) 50 MCG/ACT nasal spray Place 2 sprays into the nose 2 (two) times daily.  16 g  12  . furosemide (LASIX) 40 MG tablet Take 1 tablet (40 mg total) by mouth daily.  10 tablet  1  . ibuprofen (ADVIL,MOTRIN) 800  MG tablet Take 1 tablet (800 mg total) by mouth 3 (three) times daily.  21 tablet  0  . ipratropium-albuterol (DUONEB) 0.5-2.5 (3) MG/3ML SOLN Take 3 mLs by nebulization every 4 (four) hours as needed. For asthma symptoms      . loratadine (CLARITIN) 10 MG tablet Take 1 tablet (10 mg total) by mouth daily.  31 tablet  12  . LORazepam (ATIVAN) 1 MG tablet Take 1 tablet (1 mg total) by mouth 3 (three) times daily as needed for anxiety.  5 tablet  0  . metoprolol tartrate (LOPRESSOR) 25 MG tablet Take 1 tablet (25 mg total) by mouth 2 (two) times daily.  360 tablet  6  . montelukast (SINGULAIR) 10 MG tablet Take 1 tablet (10 mg total) by mouth at bedtime.  30 tablet  5  . oxyCODONE-acetaminophen (PERCOCET/ROXICET) 5-325 MG per tablet Take 2 tablets by mouth every 4 (four) hours as needed for pain.  15 tablet  0  . potassium chloride SA (K-DUR,KLOR-CON)  20 MEQ tablet Take 1 tablet (20 mEq total) by mouth daily.  10 tablet  1  . predniSONE (DELTASONE) 10 MG tablet Take 10 mg by mouth. Taper as directed      . Prenatal Vit-Fe Fumarate-FA (PRENATAL MULTIVITAMIN) TABS Take 1 tablet by mouth every morning.      . senna-docusate (SENOKOT-S) 8.6-50 MG per tablet Take 2 tablets by mouth at bedtime.  30 tablet  0  . DISCONTD: oxyCODONE-acetaminophen (PERCOCET) 5-325 MG per tablet Take 3 tablets by mouth every 8 (eight) hours as needed. For pain        Allergies  Allergen Reactions  . Latex Itching and Swelling    Blisters  . Morphine And Related Itching    Physical Exam:  Filed Vitals:   02/13/12 1502  BP: 122/64  Pulse: 105  Temp: 98 F (36.7 C)  TempSrc: Oral  Height: 5\' 2"  (1.575 m)  Weight: 268 lb (121.564 kg)  SpO2: 99%    Body mass index is 49.02 kg/(m^2).   Wt Readings from Last 2 Encounters:  02/13/12 268 lb (121.564 kg)  01/10/12 264 lb 12.8 oz (120.112 kg)    General - no distress, using oxygen HEENT - no sinus tenderness, no oral exudate, no LAN Cardiac - s1s2 tachycardic, no murmur Chest - prolonged exhalation, no wheezing Abd - obese, soft, non-tender Ext - no edema Neuro - normal strength Psych - normal mood behavior   Assessment/Plan:  [name], MD [facility] Pager:  [phone] 02/13/2012, 3:13 PM

## 2012-02-13 NOTE — Assessment & Plan Note (Signed)
She is to to continue CPAP at night.  Will need to assess her download when more stable. 

## 2012-02-13 NOTE — ED Notes (Signed)
Pt c/o body aches x3 days.

## 2012-02-13 NOTE — ED Notes (Signed)
MD at bedside. 

## 2012-02-15 ENCOUNTER — Telehealth: Payer: Self-pay | Admitting: Pulmonary Disease

## 2012-02-15 MED ORDER — ALBUTEROL SULFATE HFA 108 (90 BASE) MCG/ACT IN AERS: 2.0000 | INHALATION_SPRAY | Freq: Four times a day (QID) | RESPIRATORY_TRACT | Status: DC | PRN

## 2012-02-15 NOTE — Telephone Encounter (Signed)
Pt is aware RX has been sent and nothing further was needed.

## 2012-02-19 ENCOUNTER — Telehealth: Payer: Self-pay | Admitting: Hematology and Oncology

## 2012-02-19 NOTE — Telephone Encounter (Signed)
Moved 11/15 f/u to 11/29 due to poof. Pt to keep 11/13 lb appt. lmonvm informing pt and mailed new schedule.

## 2012-02-20 ENCOUNTER — Ambulatory Visit: Payer: Medicaid Other | Admitting: Family Medicine

## 2012-02-23 ENCOUNTER — Emergency Department (HOSPITAL_COMMUNITY)
Admission: EM | Admit: 2012-02-23 | Discharge: 2012-02-23 | Discharge status: Against Medical Advice | Payer: No Typology Code available for payment source

## 2012-02-25 ENCOUNTER — Telehealth: Payer: Self-pay | Admitting: Pulmonary Disease

## 2012-02-25 DIAGNOSIS — J45909 Unspecified asthma, uncomplicated: Secondary | ICD-10-CM

## 2012-02-25 MED ORDER — PREDNISONE 20 MG PO TABS: 20.0000 mg | ORAL_TABLET | Freq: Every day | ORAL | Status: DC

## 2012-02-25 NOTE — Telephone Encounter (Signed)
Pt advised. Lab ordered pt will come in tomorrow to have drawn. Appt made for 03-03-12 at 2:30pm. Also rx for pred sent to [facility].Carron Curie, CMA

## 2012-02-25 NOTE — Telephone Encounter (Signed)
Please tell pt that lab work so far was unremarkable.  Unfortunately allergy panel was not sent, and will need to be re-ordered.  Please place order for RAST with IgE.    Please send script for prednisone 20 mg daily, dispense 30 pills with 1 refill.  Please arrange for ROV with me or Tammy Parrett in next one week.

## 2012-02-25 NOTE — Telephone Encounter (Signed)
1) I spoke with the pt and she is asking for results of blood work done on 02-13-12.  She is also c/o over the weekend having an increased productive cough with some blood tinged mucus on Saturday. She states she continued to have productive cough with green phlegm and that she coughed up a large mucus plug yesterday. She also c/o wheezing as well. Pt states when she was on prednisone her breathing was better. She states she has been off of prednisone for about 2 weeks. Pt states Dr. Craige Cotta was wanting blood work first and then decide on treatment plan. I do not see any result note so I advised the pt I will get a message to Dr. Craige Cotta. Pt is requesting rx for prednisone be called in.   2) Also I see where cbc and allergy profile was ordered however I did not see any results for allergy profile, so I called the lab and they confirmed it was not drawn by mistake. Please advise if pt needs to come in now to have redraw or wait until upcoming appt?  Allergies  Allergen Reactions  . Latex Itching and Swelling    Blisters  . Morphine And Related Itching

## 2012-02-27 ENCOUNTER — Other Ambulatory Visit: Payer: Medicaid Other | Admitting: Lab

## 2012-02-27 ENCOUNTER — Telehealth: Payer: Self-pay | Admitting: Pulmonary Disease

## 2012-02-27 NOTE — Telephone Encounter (Signed)
I spoke with pt and she stated social security is suppose to be sending [place] a questionnaire. Per VS he has not received this yet.  Will check with health port in the AM. She stated she will call her rep back to have them resend this to our fax [phone]. Also wanted to know if she could have her allergy profile done tomorrow as she didn't have it done yesterday. i advised her that was fine., nothing further was needed

## 2012-02-28 ENCOUNTER — Ambulatory Visit (INDEPENDENT_AMBULATORY_CARE_PROVIDER_SITE_OTHER)
Admission: RE | Admit: 2012-02-28 | Discharge: 2012-02-28 | Disposition: A | Discharge status: Home / Self Care | Payer: Medicaid Other | Source: Ambulatory Visit | Attending: Pulmonary Disease | Admitting: Pulmonary Disease

## 2012-02-28 ENCOUNTER — Ambulatory Visit (INDEPENDENT_AMBULATORY_CARE_PROVIDER_SITE_OTHER): Payer: Medicaid Other | Admitting: Pulmonary Disease

## 2012-02-28 ENCOUNTER — Other Ambulatory Visit: Payer: Medicaid Other

## 2012-02-28 ENCOUNTER — Encounter: Payer: Self-pay | Admitting: Pulmonary Disease

## 2012-02-28 VITALS — BP 106/70 | HR 98 | Temp 98.0°F | Ht 62.0 in | Wt 265.8 lb

## 2012-02-28 DIAGNOSIS — J329 Chronic sinusitis, unspecified: Secondary | ICD-10-CM

## 2012-02-28 DIAGNOSIS — J45909 Unspecified asthma, uncomplicated: Secondary | ICD-10-CM

## 2012-02-28 DIAGNOSIS — IMO0002 Reserved for concepts with insufficient information to code with codable children: Secondary | ICD-10-CM

## 2012-02-28 DIAGNOSIS — R0902 Hypoxemia: Secondary | ICD-10-CM

## 2012-02-28 DIAGNOSIS — J4550 Severe persistent asthma, uncomplicated: Secondary | ICD-10-CM

## 2012-02-28 DIAGNOSIS — G4733 Obstructive sleep apnea (adult) (pediatric): Secondary | ICD-10-CM

## 2012-02-28 NOTE — Progress Notes (Signed)
Chief Complaint  Patient presents with  . Follow-up    c/o cough w/ green phlem, wheezing, chest tx, increase SOB. if she coughs too much she will vomit. no fever, chills, sweats    History of Present Illness: Cheyenne Arias is a 36 y.o. female with asthma, OSA, OHS.  She as been doing better since being started back on prednisone, and continues with 20 mg daily.    She still has cough with sputum.  She is coughing up plugs.  She is using her duoneb every 6 hours.  She uses albuterol a lot when she goes out.  She is doing well with CPAP.  She remains on oxygen 24/7.  Tests: PSG 03/27/11>>AHI 10.9 Echo 09/20/11>>mod LVH, EF 65 to 70%, small/mod pericardial effusion  Doppler legs 09/21/11>>no DVT CT chest 09/22/11>>no PE, Lt base ATX/ASD, borderline cardiomegaly Spirometry 01/10/12>>FEV1 1.72 (71%), FEV1% 77  Past Medical History  Diagnosis Date  . Hypertension   . Migraine   . Anxiety   . Abnormal Pap smear   . Anemia   . Fibroids   . Fibromyalgia     degenerative disc disease  . Depression   . Sleep apnea   . Shortness of breath   . On home oxygen therapy     2 liters [place]  . PONV (postoperative nausea and vomiting)   . Low iron     pt states low iron-for Feraheme today in [facility]  . Morbid obesity with BMI of 45.0-49.9, adult   . Asthma     recent admission with exacerbation of asthma 09/15/11    Past Surgical History  Procedure Date  . Wisdom tooth extraction   . Mandible surgery 2008  . Carpal tunnel release   . Cesarean section     1994, 1996   . Cesarean section 11/28/2011    Procedure: CESAREAN SECTION;  Surgeon: Adam Phenix, MD;  Location: [facility];  Service: Obstetrics;  Laterality: N/A;    Outpatient Encounter Prescriptions as of 02/28/2012  Medication Sig Dispense Refill  . albuterol (PROVENTIL HFA;VENTOLIN HFA) 108 (90 BASE) MCG/ACT inhaler Inhale 2 puffs into the lungs every 6 (six) hours as needed. for asthma/shortness of breath  1 Inhaler  5  .  amLODipine (NORVASC) 10 MG tablet Take 1 tablet (10 mg total) by mouth daily.  31 tablet  6  . budesonide (PULMICORT) 0.5 MG/2ML nebulizer solution Take 2 mLs (0.5 mg total) by nebulization 2 (two) times daily.  60 mL  3  . cyclobenzaprine (FLEXERIL) 10 MG tablet Take 10 mg by mouth 3 (three) times daily as needed. For muscle spasms      . diphenhydramine-acetaminophen (TYLENOL PM) 25-500 MG TABS Take 1 tablet by mouth at bedtime as needed. for sleep      . ferrous sulfate 325 (65 FE) MG tablet Take 1 tablet (325 mg total) by mouth daily with breakfast.  30 tablet  1  . fluticasone (FLONASE) 50 MCG/ACT nasal spray Place 2 sprays into the nose 2 (two) times daily.  16 g  12  . furosemide (LASIX) 40 MG tablet Take 1 tablet (40 mg total) by mouth daily.  10 tablet  1  . ibuprofen (ADVIL,MOTRIN) 800 MG tablet Take 1 tablet (800 mg total) by mouth 3 (three) times daily.  21 tablet  0  . ipratropium-albuterol (DUONEB) 0.5-2.5 (3) MG/3ML SOLN Take 3 mLs by nebulization every 4 (four) hours as needed. For asthma symptoms      . loratadine (CLARITIN)  10 MG tablet Take 1 tablet (10 mg total) by mouth daily.  31 tablet  12  . LORazepam (ATIVAN) 1 MG tablet Take 1 tablet (1 mg total) by mouth 3 (three) times daily as needed for anxiety.  5 tablet  0  . metoprolol tartrate (LOPRESSOR) 25 MG tablet Take 1 tablet (25 mg total) by mouth 2 (two) times daily.  360 tablet  6  . montelukast (SINGULAIR) 10 MG tablet Take 1 tablet (10 mg total) by mouth at bedtime.  30 tablet  5  . oxyCODONE-acetaminophen (PERCOCET/ROXICET) 5-325 MG per tablet Take 2 tablets by mouth every 4 (four) hours as needed for pain.  15 tablet  0  . potassium chloride SA (K-DUR,KLOR-CON) 20 MEQ tablet Take 1 tablet (20 mEq total) by mouth daily.  10 tablet  1  . predniSONE (DELTASONE) 20 MG tablet Take 1 tablet (20 mg total) by mouth daily.  30 tablet  1  . Prenatal Vit-Fe Fumarate-FA (PRENATAL MULTIVITAMIN) TABS Take 1 tablet by mouth every  morning.      . senna-docusate (SENOKOT-S) 8.6-50 MG per tablet Take 2 tablets by mouth at bedtime.  30 tablet  0  . [DISCONTINUED] doxycycline (VIBRAMYCIN) 100 MG capsule Take 100 mg by mouth 2 (two) times daily. Started 01/25/12 X 4 weeks      . [DISCONTINUED] predniSONE (DELTASONE) 10 MG tablet Take 10 mg by mouth. Taper as directed        Allergies  Allergen Reactions  . Latex Itching and Swelling    Blisters  . Morphine And Related Itching    Physical Exam:  Filed Vitals:   02/28/12 1144  BP: 106/70  Pulse: 98  Temp: 98 F (36.7 C)  Height: 5\' 2"  (1.575 m)  Weight: 265 lb 12.8 oz (120.566 kg)  SpO2: 100%    Body mass index is 48.62 kg/(m^2).   Wt Readings from Last 2 Encounters:  02/28/12 265 lb 12.8 oz (120.566 kg)  02/13/12 268 lb (121.564 kg)    General - no distress, using oxygen HEENT - no sinus tenderness, no oral exudate, no LAN Cardiac - s1s2 tachycardic, no murmur Chest - prolonged exhalation, no wheezing Abd - obese, soft, non-tender Ext - no edema Neuro - normal strength Psych - normal mood behavior   Assessment/Plan:  [name], MD [facility]/Sleep Pager:  [phone] 02/28/2012, 11:49 AM

## 2012-02-28 NOTE — Patient Instructions (Signed)
Lab tests and chest xray today Will schedule CT sinuses Follow up in one month

## 2012-02-29 ENCOUNTER — Ambulatory Visit (INDEPENDENT_AMBULATORY_CARE_PROVIDER_SITE_OTHER)
Admission: RE | Admit: 2012-02-29 | Discharge: 2012-02-29 | Disposition: A | Discharge status: Home / Self Care | Payer: Medicaid Other | Source: Ambulatory Visit | Attending: Pulmonary Disease | Admitting: Pulmonary Disease

## 2012-02-29 ENCOUNTER — Ambulatory Visit: Payer: Medicaid Other | Admitting: Hematology and Oncology

## 2012-02-29 DIAGNOSIS — J4550 Severe persistent asthma, uncomplicated: Secondary | ICD-10-CM

## 2012-02-29 DIAGNOSIS — J329 Chronic sinusitis, unspecified: Secondary | ICD-10-CM

## 2012-02-29 DIAGNOSIS — J45909 Unspecified asthma, uncomplicated: Secondary | ICD-10-CM

## 2012-02-29 LAB — ALLERGY FULL PROFILE
Bermuda Grass: <0.1 kU/L
Box Elder IgE: 0.48 kU/L — ABNORMAL HIGH
Candida Albicans: <0.1 kU/L
Curvularia lunata: <0.1 kU/L
D. farinae: <0.1 kU/L
Elm IgE: 0.34 kU/L — ABNORMAL HIGH
Fescue: <0.1 kU/L
G005 Rye, Perennial: <0.1 kU/L
G009 Red Top: <0.1 kU/L
IgE (Immunoglobulin E), Serum: 15.6 IU/mL (ref 0.0–180.0)
Oak: 2.05 kU/L — ABNORMAL HIGH
Stemphylium Botryosum: 0.32 kU/L — ABNORMAL HIGH
Timothy Grass: <0.1 kU/L

## 2012-02-29 NOTE — Assessment & Plan Note (Signed)
She is to to continue CPAP at night.       

## 2012-02-29 NOTE — Assessment & Plan Note (Signed)
She is to continue oxygen 24/7 for now.  Will re-assess O2 needs after asthma is more stable.

## 2012-02-29 NOTE — Assessment & Plan Note (Signed)
She is to continue prednisone, and her nebulizer regimen.  Will repeat chest xray.  Will check RAST with IgE and CT sinus.

## 2012-02-29 NOTE — Assessment & Plan Note (Signed)
She is to continue flonase and claritin.

## 2012-03-02 ENCOUNTER — Encounter (HOSPITAL_BASED_OUTPATIENT_CLINIC_OR_DEPARTMENT_OTHER): Payer: Self-pay

## 2012-03-02 ENCOUNTER — Emergency Department (HOSPITAL_BASED_OUTPATIENT_CLINIC_OR_DEPARTMENT_OTHER)
Admission: EM | Admit: 2012-03-02 | Discharge: 2012-03-02 | Disposition: A | Discharge status: Home / Self Care | Payer: Medicaid Other | Attending: Emergency Medicine | Admitting: Emergency Medicine

## 2012-03-02 DIAGNOSIS — R093 Abnormal sputum: Secondary | ICD-10-CM | POA: Insufficient documentation

## 2012-03-02 DIAGNOSIS — F3289 Other specified depressive episodes: Secondary | ICD-10-CM | POA: Insufficient documentation

## 2012-03-02 DIAGNOSIS — F411 Generalized anxiety disorder: Secondary | ICD-10-CM | POA: Insufficient documentation

## 2012-03-02 DIAGNOSIS — D649 Anemia, unspecified: Secondary | ICD-10-CM | POA: Insufficient documentation

## 2012-03-02 DIAGNOSIS — G4730 Sleep apnea, unspecified: Secondary | ICD-10-CM | POA: Insufficient documentation

## 2012-03-02 DIAGNOSIS — R0602 Shortness of breath: Secondary | ICD-10-CM | POA: Insufficient documentation

## 2012-03-02 DIAGNOSIS — IMO0001 Reserved for inherently not codable concepts without codable children: Secondary | ICD-10-CM | POA: Insufficient documentation

## 2012-03-02 DIAGNOSIS — I10 Essential (primary) hypertension: Secondary | ICD-10-CM | POA: Insufficient documentation

## 2012-03-02 DIAGNOSIS — R059 Cough, unspecified: Secondary | ICD-10-CM | POA: Insufficient documentation

## 2012-03-02 DIAGNOSIS — F329 Major depressive disorder, single episode, unspecified: Secondary | ICD-10-CM | POA: Insufficient documentation

## 2012-03-02 DIAGNOSIS — J45909 Unspecified asthma, uncomplicated: Secondary | ICD-10-CM | POA: Insufficient documentation

## 2012-03-02 DIAGNOSIS — Z8679 Personal history of other diseases of the circulatory system: Secondary | ICD-10-CM | POA: Insufficient documentation

## 2012-03-02 DIAGNOSIS — R05 Cough: Secondary | ICD-10-CM | POA: Insufficient documentation

## 2012-03-02 DIAGNOSIS — R071 Chest pain on breathing: Secondary | ICD-10-CM | POA: Insufficient documentation

## 2012-03-02 DIAGNOSIS — Z79899 Other long term (current) drug therapy: Secondary | ICD-10-CM | POA: Insufficient documentation

## 2012-03-02 DIAGNOSIS — R062 Wheezing: Secondary | ICD-10-CM | POA: Insufficient documentation

## 2012-03-02 DIAGNOSIS — IMO0002 Reserved for concepts with insufficient information to code with codable children: Secondary | ICD-10-CM | POA: Insufficient documentation

## 2012-03-02 MED ORDER — ALBUTEROL SULFATE (5 MG/ML) 0.5% IN NEBU
5.0000 mg | INHALATION_SOLUTION | Freq: Once | RESPIRATORY_TRACT | Status: AC
  Administered 2012-03-02: 5 mg via RESPIRATORY_TRACT
  Filled 2012-03-02: qty 1

## 2012-03-02 MED ORDER — DOXYCYCLINE HYCLATE 100 MG PO CAPS: 100.0000 mg | ORAL_CAPSULE | Freq: Two times a day (BID) | ORAL | Status: DC

## 2012-03-02 NOTE — ED Notes (Signed)
Patient here with chronic cough and congestion since June. Reports that she is on continuous oxygen at home for her asthma and the past week has had more productivity with cough and noticed some blood tinge with same., no acute distress

## 2012-03-02 NOTE — ED Provider Notes (Signed)
History     CSN: [id number]  Arrival date & time 03/02/12  0203   First MD Initiated Contact with Patient 03/02/12 0221      Chief Complaint  Patient presents with  . Cough     Patient is a 36 y.o. female presenting with cough. The history is provided by the patient.  Cough This is a chronic problem. The current episode started more than 1 week ago. The problem occurs hourly. The problem has been gradually worsening. The cough is productive of blood-tinged sputum. There has been no fever. Associated symptoms include shortness of breath and wheezing. She is not a smoker.  Pt reports cough for several months but in the past several days she has had increased sputum production with small amt of blood specks in sputum.  She also reports chest wall pain from cough.  She denies significant change in baseline dyspnea (has h/o asthma and she is on O2 at all times, no recent increase in O2 requirement) No new LE edema She reports recent eval by pulmonology and had negative CXR She is on daily prednisone She is not on antibiotics at this time  Past Medical History  Diagnosis Date  . Hypertension   . Migraine   . Anxiety   . Abnormal Pap smear   . Anemia   . Fibroids   . Fibromyalgia     degenerative disc disease  . Depression   . Sleep apnea   . Shortness of breath   . On home oxygen therapy     2 liters [place]  . PONV (postoperative nausea and vomiting)   . Low iron     pt states low iron-for Feraheme today in [facility]  . Morbid obesity with BMI of 45.0-49.9, adult   . Asthma     recent admission with exacerbation of asthma 09/15/11    Past Surgical History  Procedure Date  . Wisdom tooth extraction   . Mandible surgery 2008  . Carpal tunnel release   . Cesarean section     1994, 1996   . Cesarean section 11/28/2011    Procedure: CESAREAN SECTION;  Surgeon: Adam Phenix, MD;  Location: [facility];  Service: Obstetrics;  Laterality: N/A;    Family History  Problem Relation Age of  Onset  . Sickle cell trait Maternal Aunt   . Sickle cell anemia Other     History  Substance Use Topics  . Smoking status: Never Smoker   . Smokeless tobacco: Never Used     Comment: Pt was exposed to 2nd hand smoke at work years ago.  . Alcohol Use: No    OB History    Grav Para Term Preterm Abortions TAB SAB Ect Mult Living   3 3 3       3       Review of Systems  Respiratory: Positive for cough, shortness of breath and wheezing.   All other systems reviewed and are negative.    Allergies  Latex and Morphine and related  Home Medications   Current Outpatient Rx  Name  Route  Sig  Dispense  Refill  . ALBUTEROL SULFATE HFA 108 (90 BASE) MCG/ACT IN AERS   Inhalation   Inhale 2 puffs into the lungs every 6 (six) hours as needed. for asthma/shortness of breath   1 Inhaler   5   . AMLODIPINE BESYLATE 10 MG PO TABS   Oral   Take 1 tablet (10 mg total) by mouth daily.   31  tablet   6   . BUDESONIDE 0.5 MG/2ML IN SUSP   Nebulization   Take 2 mLs (0.5 mg total) by nebulization 2 (two) times daily.   60 mL   3   . CYCLOBENZAPRINE HCL 10 MG PO TABS   Oral   Take 10 mg by mouth 3 (three) times daily as needed. For muscle spasms         . DIPHENHYDRAMINE-APAP (SLEEP) 25-500 MG PO TABS   Oral   Take 1 tablet by mouth at bedtime as needed. for sleep         . DOXYCYCLINE HYCLATE 100 MG PO CAPS   Oral   Take 1 capsule (100 mg total) by mouth 2 (two) times daily.   14 capsule   0   . FERROUS SULFATE 325 (65 FE) MG PO TABS   Oral   Take 1 tablet (325 mg total) by mouth daily with breakfast.   30 tablet   1   . FLUTICASONE PROPIONATE 50 MCG/ACT NA SUSP   Nasal   Place 2 sprays into the nose 2 (two) times daily.   16 g   12   . FUROSEMIDE 40 MG PO TABS   Oral   Take 1 tablet (40 mg total) by mouth daily.   10 tablet   1   . IBUPROFEN 800 MG PO TABS   Oral   Take 1 tablet (800 mg total) by mouth 3 (three) times daily.   21 tablet   0   .  IPRATROPIUM-ALBUTEROL 0.5-2.5 (3) MG/3ML IN SOLN   Nebulization   Take 3 mLs by nebulization every 4 (four) hours as needed. For asthma symptoms         . LORATADINE 10 MG PO TABS   Oral   Take 1 tablet (10 mg total) by mouth daily.   31 tablet   12   . LORAZEPAM 1 MG PO TABS   Oral   Take 1 tablet (1 mg total) by mouth 3 (three) times daily as needed for anxiety.   5 tablet   0   . METOPROLOL TARTRATE 25 MG PO TABS   Oral   Take 1 tablet (25 mg total) by mouth 2 (two) times daily.   360 tablet   6   . MONTELUKAST SODIUM 10 MG PO TABS   Oral   Take 1 tablet (10 mg total) by mouth at bedtime.   30 tablet   5   . OXYCODONE-ACETAMINOPHEN 5-325 MG PO TABS   Oral   Take 2 tablets by mouth every 4 (four) hours as needed for pain.   15 tablet   0   . POTASSIUM CHLORIDE CRYS ER 20 MEQ PO TBCR   Oral   Take 1 tablet (20 mEq total) by mouth daily.   10 tablet   1   . PREDNISONE 20 MG PO TABS   Oral   Take 1 tablet (20 mg total) by mouth daily.   30 tablet   1   . PRENATAL MULTIVITAMIN CH   Oral   Take 1 tablet by mouth every morning.         Bernadette Hoit SODIUM 8.6-50 MG PO TABS   Oral   Take 2 tablets by mouth at bedtime.   30 tablet   0     BP 132/105  Pulse 105  Temp 98.2 F (36.8 C) (Oral)  Resp 22  SpO2 100%  LMP 01/26/2012  Physical Exam CONSTITUTIONAL: Well  developed/well nourished HEAD AND FACE: Normocephalic/atraumatic EYES: EOMI/PERRL ENMT: Mucous membranes moist NECK: supple no meningeal signs CV: S1/S2 noted, no murmurs/rubs/gallops noted Chest - tender to palpation over right lower chest wall, no crepitance LUNGS: scattered wheezing bilaterally, no tachypnea, she is able to speak to me clearly. Lung sounds are equal bilaterally ABDOMEN: soft, nontender, no rebound or guarding GU:no cva tenderness NEURO: Pt is awake/alert, moves all extremitiesx4 EXTREMITIES: pulses normal, full ROM SKIN: warm, color normal PSYCH: no  abnormalities of mood noted  ED Course  Procedures      1. Cough    Pt with recent CXR by her pulmonologist will not repeat She is well appearing.  I doubt ACS/PE at this time She has reproducible chest wall pain Will start doxycycline since she has had change in her sputum production Stable for d/c  MDM  Nursing notes including past medical history and social history reviewed and considered in documentation Previous records reviewed and considered         Joya Gaskins, MD 03/02/12 [phone]

## 2012-03-03 ENCOUNTER — Telehealth: Payer: Self-pay | Admitting: Pulmonary Disease

## 2012-03-03 ENCOUNTER — Ambulatory Visit: Payer: Medicaid Other | Admitting: Adult Health

## 2012-03-03 NOTE — Telephone Encounter (Signed)
Pt is going to contact social security about forms again and will have them sent directly to [employer] dept

## 2012-03-03 NOTE — Telephone Encounter (Signed)
lmomtcb x1 for pt 

## 2012-03-03 NOTE — Telephone Encounter (Signed)
I looked in RA/VS office for paperwork. Did not see anything in office/look at area. I spoke with VS and he has not seen any paperwork at all on this pt. He stated he does not have any paperwork on pt either. I have paged Dr. Vassie Loll to see if he has seen anything on pt.   Form was not received in our office. I called pt lmomtcb x1 to get # for her social worker to have them fax forms directly to me and will have VS hand forms personally to VS. Will await pt call back

## 2012-03-04 NOTE — Telephone Encounter (Signed)
LMOMTCB

## 2012-03-05 NOTE — Telephone Encounter (Signed)
Returning call can be reached at [phone].Cheyenne Arias

## 2012-03-05 NOTE — Telephone Encounter (Signed)
i spoke with pt and is aware will look out for her paperwork as this has already been sent down to [employer]. Nothing further was needed

## 2012-03-05 NOTE — Telephone Encounter (Signed)
Pt returned triage's call.  Holly D Pryor ° °

## 2012-03-05 NOTE — Telephone Encounter (Signed)
LMOMTCB x 1 

## 2012-03-06 ENCOUNTER — Other Ambulatory Visit (HOSPITAL_BASED_OUTPATIENT_CLINIC_OR_DEPARTMENT_OTHER): Payer: Medicaid Other

## 2012-03-06 DIAGNOSIS — D649 Anemia, unspecified: Secondary | ICD-10-CM

## 2012-03-06 LAB — BASIC METABOLIC PANEL (CC13)
BUN: 8 mg/dL (ref 7.0–26.0)
Chloride: 103 mEq/L (ref 98–107)
Glucose: 115 mg/dl — ABNORMAL HIGH (ref 70–99)
Potassium: 3.7 mEq/L (ref 3.5–5.1)

## 2012-03-06 LAB — CBC WITH DIFFERENTIAL/PLATELET
BASO%: 0.3 % (ref 0.0–2.0)
EOS%: 0.7 % (ref 0.0–7.0)
HCT: 31 % — ABNORMAL LOW (ref 34.8–46.6)
MCH: 19.6 pg — ABNORMAL LOW (ref 25.1–34.0)
MCHC: 30.6 g/dL — ABNORMAL LOW (ref 31.5–36.0)
NEUT%: 90.7 % — ABNORMAL HIGH (ref 38.4–76.8)
RBC: 4.85 10*6/uL (ref 3.70–5.45)
RDW: 17.8 % — ABNORMAL HIGH (ref 11.2–14.5)
lymph#: 1 10*3/uL (ref 0.9–3.3)

## 2012-03-07 LAB — IRON AND TIBC

## 2012-03-10 ENCOUNTER — Telehealth: Payer: Self-pay | Admitting: Pulmonary Disease

## 2012-03-10 NOTE — Telephone Encounter (Signed)
Pt returned call. I advised her re: msg that nurse will call her when this has been done. She voiced understanding. Cheyenne Arias

## 2012-03-10 NOTE — Telephone Encounter (Signed)
lmomtcb x1 for pt. VS has not been in yet. Will call pt once done. If not today then tomorrow.

## 2012-03-11 ENCOUNTER — Telehealth: Payer: Self-pay | Admitting: Pulmonary Disease

## 2012-03-11 NOTE — Telephone Encounter (Signed)
I have faxed this over to Phshikie Mackey at [phone]. lmtcb x1 for pt

## 2012-03-11 NOTE — Telephone Encounter (Signed)
Pt called back with fax # [phone]- put this to the attn: of the woman's name that's on papers (pt couldn't recall her name)- also put nurse's name and contact # [phone] so that they can call back to confirm fax was received. Pt also asks Mindy to call her back as well. Cheyenne Arias

## 2012-03-11 NOTE — Telephone Encounter (Signed)
02/28/2012  *RADIOLOGY REPORT*   Clinical Data: Cough, wheezing   CHEST - 2 VIEW   Comparison: 01/10/2012  Findings: Cardiomediastinal silhouette is stable.  No acute infiltrate or pulmonary edema.  There is linear atelectasis in the lingula.  Bony thorax is stable.   IMPRESSION: No acute infiltrate or pulmonary edema.  Linear atelectasis in the lingula.    Original Report Authenticated By: Natasha Mead, M.D.     02/29/2012  *RADIOLOGY REPORT*   Clinical Data:  Persistent asthma.  Left maxillary sinus pressure. Productive cough with green sputum.   CT PARANASAL SINUS LIMITED WITHOUT CONTRAST Technique:  Multidetector CT images of the paranasal sinuses were obtained in a single plane without contrast. Comparison:  01/16/2011 head CT, 12/01/2009 and 07/03/2003 MR.   Findings:  Clear paranasal sinuses.  Abnormal appearance of the clivus.  In retrospect, this is noted on the remote 2005 MR and appears to contain fat. It is difficult to make direct comparison to the prior MR.  Contrast enhanced MR utilizing pituitary protocol with attention to the clivus can be performed to confirm stability of this nonspecific clivus lesion.   IMPRESSION: Clear paranasal sinuses.  Abnormal appearance of the clivus.  In retrospect, this is noted on the remote 2005 MR and appears to contain fat. It is difficult to make direct comparison to the prior MR.  Contrast enhanced MR utilizing pituitary protocol with attention to the clivus can be performed to confirm stability of this nonspecific clivus lesion.    Original Report Authenticated By: Lacy Duverney, M.D.    Discussed results with pt.    Explained that she has normal CXR.    CT sinus clear expect for ?abnormal appearance of clivus.  Will discuss further at next ROV to determine if repeat MRI brain needed.  RAST shows several different allergens.  Will discuss further at next visit whether she needs referral to allergist.

## 2012-03-11 NOTE — Telephone Encounter (Signed)
Advised pt that Mindy faxed over to Phshikie Mackey at [phone].  Pt states that Ms. Damita Lack will be calling to confirm receipt of this.  Pt requests a returned call once Ms. Damita Lack has confirmed receipt.  Antionette Fairy

## 2012-03-11 NOTE — Telephone Encounter (Signed)
Forms has been filled out by Dr. Craige Cotta. I called pt and made her aware. She is going to call me back to confirm fax number this needs to be sent too. Will await call back.

## 2012-03-12 NOTE — Telephone Encounter (Signed)
Called [phone] ext [phone] lmomtcb x1 for phshikie mackey to ensure she received the paper work

## 2012-03-12 NOTE — Telephone Encounter (Signed)
Spoke with pt. Informed her Cheyenne Arias called to inform her she spoke with Cheyenne Arias who did receive the paperwork.  Pt verbalized understanding and voice no further questions/concerns at this time.

## 2012-03-12 NOTE — Telephone Encounter (Signed)
I spoke with [name] and she did receive the paperwork. I have placed this in VS scan folder. LMOMTCB X1 for pt maker her aware of this

## 2012-03-14 ENCOUNTER — Ambulatory Visit: Payer: Medicaid Other | Admitting: Hematology and Oncology

## 2012-03-19 ENCOUNTER — Telehealth: Payer: Self-pay | Admitting: *Deleted

## 2012-03-19 ENCOUNTER — Other Ambulatory Visit: Payer: Self-pay | Admitting: *Deleted

## 2012-03-19 DIAGNOSIS — D649 Anemia, unspecified: Secondary | ICD-10-CM

## 2012-03-19 NOTE — Telephone Encounter (Signed)
Error

## 2012-03-19 NOTE — Telephone Encounter (Signed)
Cheyenne Arias called and left message requesting a rescheduled appt with md.   Cheyenne Arias stated she was sick last week, and was not able to keep f/u appt as scheduled.   Cheyenne Arias would like to reschedule appt with Dr. Dalene Carrow. Cheyenne Arias's   Phone    [phone].

## 2012-03-19 NOTE — Telephone Encounter (Signed)
Left voice message to inform the patient of the new date and time on 04-25-2012 at 11:15am  04-23-2012 at 11:15am

## 2012-03-25 ENCOUNTER — Other Ambulatory Visit: Payer: Self-pay | Admitting: Pulmonary Disease

## 2012-03-26 ENCOUNTER — Emergency Department (HOSPITAL_BASED_OUTPATIENT_CLINIC_OR_DEPARTMENT_OTHER): Payer: Medicaid Other

## 2012-03-26 ENCOUNTER — Encounter (HOSPITAL_BASED_OUTPATIENT_CLINIC_OR_DEPARTMENT_OTHER): Payer: Self-pay | Admitting: *Deleted

## 2012-03-26 ENCOUNTER — Emergency Department (HOSPITAL_BASED_OUTPATIENT_CLINIC_OR_DEPARTMENT_OTHER)
Admission: EM | Admit: 2012-03-26 | Discharge: 2012-03-26 | Disposition: A | Discharge status: Home / Self Care | Payer: Medicaid Other | Attending: Emergency Medicine | Admitting: Emergency Medicine

## 2012-03-26 DIAGNOSIS — Z8739 Personal history of other diseases of the musculoskeletal system and connective tissue: Secondary | ICD-10-CM | POA: Insufficient documentation

## 2012-03-26 DIAGNOSIS — Z79899 Other long term (current) drug therapy: Secondary | ICD-10-CM | POA: Insufficient documentation

## 2012-03-26 DIAGNOSIS — R509 Fever, unspecified: Secondary | ICD-10-CM | POA: Insufficient documentation

## 2012-03-26 DIAGNOSIS — F329 Major depressive disorder, single episode, unspecified: Secondary | ICD-10-CM | POA: Insufficient documentation

## 2012-03-26 DIAGNOSIS — I10 Essential (primary) hypertension: Secondary | ICD-10-CM | POA: Insufficient documentation

## 2012-03-26 DIAGNOSIS — D649 Anemia, unspecified: Secondary | ICD-10-CM | POA: Insufficient documentation

## 2012-03-26 DIAGNOSIS — F3289 Other specified depressive episodes: Secondary | ICD-10-CM | POA: Insufficient documentation

## 2012-03-26 DIAGNOSIS — Z9981 Dependence on supplemental oxygen: Secondary | ICD-10-CM | POA: Insufficient documentation

## 2012-03-26 DIAGNOSIS — F411 Generalized anxiety disorder: Secondary | ICD-10-CM | POA: Insufficient documentation

## 2012-03-26 DIAGNOSIS — J45909 Unspecified asthma, uncomplicated: Secondary | ICD-10-CM | POA: Insufficient documentation

## 2012-03-26 DIAGNOSIS — J189 Pneumonia, unspecified organism: Secondary | ICD-10-CM | POA: Insufficient documentation

## 2012-03-26 DIAGNOSIS — G4730 Sleep apnea, unspecified: Secondary | ICD-10-CM | POA: Insufficient documentation

## 2012-03-26 DIAGNOSIS — Z8669 Personal history of other diseases of the nervous system and sense organs: Secondary | ICD-10-CM | POA: Insufficient documentation

## 2012-03-26 MED ORDER — LEVOFLOXACIN 750 MG PO TABS: 750.0000 mg | ORAL_TABLET | Freq: Every day | ORAL | Status: DC

## 2012-03-26 MED ORDER — ACETAMINOPHEN 325 MG PO TABS
650.0000 mg | ORAL_TABLET | Freq: Once | ORAL | Status: AC
  Administered 2012-03-26: 650 mg via ORAL
  Filled 2012-03-26: qty 2

## 2012-03-26 MED ORDER — LEVOFLOXACIN 750 MG PO TABS
750.0000 mg | ORAL_TABLET | Freq: Once | ORAL | Status: AC
  Administered 2012-03-26: 750 mg via ORAL
  Filled 2012-03-26: qty 1

## 2012-03-26 NOTE — ED Notes (Addendum)
Pt c/o increased sputum yellow and green and coughing , HX asthma pt also c/o  bil ear pain

## 2012-03-26 NOTE — ED Provider Notes (Signed)
History     CSN: [id number]  Arrival date & time 03/26/12  1630   First MD Initiated Contact with Patient 03/26/12 1645      Chief Complaint  Patient presents with  . URI    (Consider location/radiation/quality/duration/timing/severity/associated sxs/prior treatment) HPI Comments: Pt states that she has had productive cough:pt states that she didn't know that she had a fever  Patient is a 36 y.o. female presenting with cough. The history is provided by the patient. No language interpreter was used.  Cough This is a new problem. The current episode started more than 2 days ago. The problem occurs constantly. The problem has not changed since onset.The cough is productive of sputum. The maximum temperature recorded prior to her arrival was 101 to 101.9 F. Associated symptoms include ear pain.    Past Medical History  Diagnosis Date  . Hypertension   . Migraine   . Anxiety   . Abnormal Pap smear   . Anemia   . Fibroids   . Fibromyalgia     degenerative disc disease  . Depression   . Sleep apnea   . Shortness of breath   . On home oxygen therapy     2 liters [place]  . PONV (postoperative nausea and vomiting)   . Low iron     pt states low iron-for Feraheme today in [facility]  . Morbid obesity with BMI of 45.0-49.9, adult   . Asthma     recent admission with exacerbation of asthma 09/15/11    Past Surgical History  Procedure Date  . Wisdom tooth extraction   . Mandible surgery 2008  . Carpal tunnel release   . Cesarean section     1994, 1996   . Cesarean section 11/28/2011    Procedure: CESAREAN SECTION;  Surgeon: Adam Phenix, MD;  Location: [facility];  Service: Obstetrics;  Laterality: N/A;    Family History  Problem Relation Age of Onset  . Sickle cell trait Maternal Aunt   . Sickle cell anemia Other     History  Substance Use Topics  . Smoking status: Never Smoker   . Smokeless tobacco: Never Used     Comment: Pt was exposed to 2nd hand smoke at work years ago.  .  Alcohol Use: No    OB History    Grav Para Term Preterm Abortions TAB SAB Ect Mult Living   3 3 3       3       Review of Systems  Constitutional: Negative.   HENT: Positive for ear pain.   Respiratory: Positive for cough.   Cardiovascular: Negative.   Neurological: Negative.     Allergies  Latex and Morphine and related  Home Medications   Current Outpatient Rx  Name  Route  Sig  Dispense  Refill  . ALBUTEROL SULFATE HFA 108 (90 BASE) MCG/ACT IN AERS   Inhalation   Inhale 2 puffs into the lungs every 6 (six) hours as needed. for asthma/shortness of breath   1 Inhaler   5   . AMLODIPINE BESYLATE 10 MG PO TABS   Oral   Take 1 tablet (10 mg total) by mouth daily.   31 tablet   6   . CYCLOBENZAPRINE HCL 10 MG PO TABS   Oral   Take 10 mg by mouth 3 (three) times daily as needed. For muscle spasms         . DIPHENHYDRAMINE-APAP (SLEEP) 25-500 MG PO TABS   Oral  Take 1 tablet by mouth at bedtime as needed. for sleep         . DOXYCYCLINE HYCLATE 100 MG PO CAPS   Oral   Take 1 capsule (100 mg total) by mouth 2 (two) times daily.   14 capsule   0   . FERROUS SULFATE 325 (65 FE) MG PO TABS   Oral   Take 1 tablet (325 mg total) by mouth daily with breakfast.   30 tablet   1   . FLUTICASONE PROPIONATE 50 MCG/ACT NA SUSP   Nasal   Place 2 sprays into the nose 2 (two) times daily.   16 g   12   . FUROSEMIDE 40 MG PO TABS   Oral   Take 1 tablet (40 mg total) by mouth daily.   10 tablet   1   . IBUPROFEN 800 MG PO TABS   Oral   Take 1 tablet (800 mg total) by mouth 3 (three) times daily.   21 tablet   0   . IPRATROPIUM-ALBUTEROL 0.5-2.5 (3) MG/3ML IN SOLN   Nebulization   Take 3 mLs by nebulization every 4 (four) hours as needed. For asthma symptoms         . LEVOFLOXACIN 750 MG PO TABS   Oral   Take 1 tablet (750 mg total) by mouth daily.   5 tablet   0   . LORATADINE 10 MG PO TABS   Oral   Take 1 tablet (10 mg total) by mouth daily.   31  tablet   12   . LORAZEPAM 1 MG PO TABS   Oral   Take 1 tablet (1 mg total) by mouth 3 (three) times daily as needed for anxiety.   5 tablet   0   . METOPROLOL TARTRATE 25 MG PO TABS   Oral   Take 1 tablet (25 mg total) by mouth 2 (two) times daily.   360 tablet   6   . MONTELUKAST SODIUM 10 MG PO TABS   Oral   Take 1 tablet (10 mg total) by mouth at bedtime.   30 tablet   5   . OXYCODONE-ACETAMINOPHEN 5-325 MG PO TABS   Oral   Take 2 tablets by mouth every 4 (four) hours as needed for pain.   15 tablet   0   . POTASSIUM CHLORIDE CRYS ER 20 MEQ PO TBCR   Oral   Take 1 tablet (20 mEq total) by mouth daily.   10 tablet   1   . PREDNISONE 20 MG PO TABS   Oral   Take 1 tablet (20 mg total) by mouth daily.   30 tablet   1   . PRENATAL MULTIVITAMIN CH   Oral   Take 1 tablet by mouth every morning.         [name] PULMICORT 0.5 MG/2ML IN SUSP      TAKE 1 VIAL (0.5 MG TOTAL) BY NEBULIZATION 2 (TWO) TIMES DAILY.   60 mL   3   . SENNOSIDES-DOCUSATE SODIUM 8.6-50 MG PO TABS   Oral   Take 2 tablets by mouth at bedtime.   30 tablet   0     BP 129/63  Pulse 118  Temp 101.8 F (38.8 C) (Oral)  Resp 16  Ht 5\' 4"  (1.626 m)  Wt 265 lb (120.203 kg)  BMI 45.49 kg/m2  SpO2 100%  LMP 03/25/2012  Breastfeeding? No  Physical Exam  Nursing note and vitals reviewed.  Constitutional: She is oriented to person, place, and time. She appears well-nourished.  HENT:  Head: Normocephalic and atraumatic.  Right Ear: External ear normal.  Mouth/Throat: Oropharynx is clear and moist.  Eyes: Conjunctivae normal and EOM are normal.  Neck: Normal range of motion. Neck supple.  Cardiovascular: Normal rate and regular rhythm.   Pulmonary/Chest:       Left upper crackles  Musculoskeletal: Normal range of motion.  Neurological: She is alert and oriented to person, place, and time.  Skin: Skin is warm and dry.    ED Course  Procedures (including critical care time)  Labs  Reviewed - No data to display Dg Chest 2 View  03/26/2012  *RADIOLOGY REPORT*  Clinical Data: Cough.  Fever.  Wheezing.  CHEST - 2 VIEW  Comparison: 02/28/2012  Findings: Airspace consolidation is present on the lateral view over the lower thoracic spine, compatible with airspace disease and superior segment.  This is probably in the left lower lobe based on retrocardiac opacity on the frontal view.  There is no effusion. This is new compared to prior exam.  The cardiopericardial silhouette appears within normal limits.  Follow-up to ensure radiographic clearing recommended.  Clearing is usually observed at 8 weeks.  IMPRESSION: Superior segment left lower lobe pneumonia.   Original Report Authenticated By: Andreas Newport, M.D.      1. Community acquired pneumonia       MDM  Pt has recently been on doxy and zithromax:will treat with levaquin:pt in no distress will send home:pt has pcp and pulmonology follow up        Teressa Lower, NP 03/26/12 1752  Teressa Lower, NP 03/26/12 1755

## 2012-03-26 NOTE — ED Notes (Signed)
Patient transported to X-ray 

## 2012-03-26 NOTE — ED Provider Notes (Signed)
Medical screening examination/treatment/procedure(s) were performed by non-physician practitioner and as supervising physician I was immediately available for consultation/collaboration.   Jorja Empie, MD 03/26/12 1906 

## 2012-03-27 ENCOUNTER — Encounter: Payer: Self-pay | Admitting: Adult Health

## 2012-03-27 ENCOUNTER — Ambulatory Visit (INDEPENDENT_AMBULATORY_CARE_PROVIDER_SITE_OTHER): Payer: Medicaid Other | Admitting: Adult Health

## 2012-03-27 VITALS — BP 122/78 | HR 116 | Temp 98.7°F | Ht 62.0 in | Wt 270.6 lb

## 2012-03-27 DIAGNOSIS — J189 Pneumonia, unspecified organism: Secondary | ICD-10-CM

## 2012-03-27 MED ORDER — ALBUTEROL SULFATE HFA 108 (90 BASE) MCG/ACT IN AERS: 2.0000 | INHALATION_SPRAY | Freq: Four times a day (QID) | RESPIRATORY_TRACT | Status: DC | PRN

## 2012-03-27 NOTE — Patient Instructions (Addendum)
Finish Levaquin as directed. Begin Mucinex DM twice daily. Continue on Pulmicort nebulizer twice daily Uses DuoNeb nebulizer every 6 hours until seen back in, the office in 4 days Continue on prednisone 20 mg daily  Avoid aspirin products for now Follow up with Dr. Craige Cotta  4 days, and as needed Please contact office for sooner follow up if symptoms do not improve or worsen or seek emergency care '

## 2012-03-27 NOTE — Progress Notes (Signed)
Reviewed and agree with assessment/plan. 

## 2012-03-27 NOTE — Progress Notes (Signed)
Subjective:    Patient ID: Cheyenne Arias, female    DOB: 02/24/1976, 36 y.o.   MRN: [id number] HPI 36  yo G3 P2 female seen for initial pulmonary consult during hospitalization 09/19/11 for asthma flare during pregnancy .  She has history of mild, intermittent asthma.  She has hypertension. She has a history of mild sleep apnea (PSG 03/27/11>>AHI 10.9), and is on CPAP 10 cm H2O.  10/30/2011 Admitted 6/1-6/14 /13 for asthma exacerbation during pregnancy-c/b sinusitis, OSA  Tx w aggressive pulmonary hygiene , abx and steroids along with CPAP. Started on oxygen due to desaturations noted by Ob service. Ct angio, duplex neg, echo -nml LV fn Seen on 10/09/2011 for post hospital visit  Developed an acne like rash along chest , back and upper arms , went to ER , rx short steroid taper on 6/23. cpap - was on 10 now On auto 5-15  c section planned for 8/5 /13  Acute visit today for  c/o chest congestion, cough w/ clumps of green phlem, increase in SOB chest heaviness that last all day x 3 days. she has been using the pulmicort q12hrs and atrovent every 4 hours. Since discharge feels better but still gets winded easily.  Some wheezing on /off.  Wears O2 continuously.  Wearing CPAP every night.  Does have some congestion-clear.  Finished prednisone .    She is doing well with CPAP.  She remains on oxygen 24/7.  Tests: PSG 03/27/11>>AHI 10.9 Echo 09/20/11>>mod LVH, EF 65 to 70%, small/mod pericardial effusion  Doppler legs 09/21/11>>no DVT CT chest 09/22/11>>no PE, Lt base ATX/ASD, borderline cardiomegaly Spirometry 01/10/12>>FEV1 1.72 (71%), FEV1% 77  03/27/2012 ER follow up  Pt walked in today to clinic to be seen . She was seen in ER 03/26/12 for increased cough /congestion w/ blood tinged mucus. She was dx with LLL CAP on cxr. . Tx w/ Levaquin 750mg  daily x 5 days  Complains that over 3 days she has had fever, cough with thick mucus plugs and blood tinged mucus.   She is concerned that she is  still coughing up blood tinged mucus. Very upset that she continues to have recurrent cough with productive large mucus plug that are very large.  She brought in a mucus plug soaked in water contained in a  baby food jar . It was plug with no obvious blood or clots noted.     No frank hemoptysis . No fever or edema  Painful cough in ribs at times, esp on left.  Mother and patient demand that she have a Bronchoscopy today, "this was recommended by the doctor in ER" . " I need to be flushed out"  I attempted to explain what a  Bronchoscopy is and how the procedure works . As well as PNA and typical course and treatment . However they were upset that a Bronch was not going to be done immediately. Used several references that they were not being provided with adequate care despite attempts to explain her condition.  They then demanded they be given " samples now because I do not have money"   Seen 3 weeks ago in office CT sinus was neg for sinusitis    IGE level was 15 . She has follow up with Dr. Craige Cotta  In 4 days .      Review of Systems Constitutional:   No  weight loss, night sweats,  + Fevers, chills,  +fatigue, or  lassitude.  HEENT:   No headaches,  Difficulty swallowing,  Tooth/dental problems, or  Sore throat,                No sneezing, itching, ear ache,  +nasal congestion, post nasal drip,   CV:  No chest pain,  Orthopnea, PND, swelling in lower extremities, anasarca, dizziness, palpitations, syncope.   GI  No heartburn, indigestion, abdominal pain, nausea, vomiting, diarrhea, change in bowel habits, loss of appetite, bloody stools.   Resp:   No wheezing.  No chest wall deformity  Skin: no rash or lesions.  GU: no dysuria, change in color of urine, no urgency or frequency.  No flank pain, no hematuria   MS:  No joint pain or swelling.  No decreased range of motion.  No back pain.  Psych:  No change in mood or affect. No depression or anxiety.  No memory loss.          Objective:   Physical Exam GEN: A/Ox3; pleasant , NAD, morbidly obese pt.   HEENT:  [place]/AT,  EACs-clear, TMs-wnl, NOSE-clear drainage , THROAT-clear, no lesions, no postnasal drip or exudate noted.   NECK:  Supple w/ fair ROM; no JVD; normal carotid impulses w/o bruits; no thyromegaly or nodules palpated; no lymphadenopathy.  RESP  Clear  P & A, diminished BS in bases  w/o, wheezes/ rales/ or rhonchi.no accessory muscle use, no dullness to percussion  CARD:  RRR, no m/r/g  , tr peripheral edema, pulses intact, no cyanosis or clubbing.  GI:   Soft & nt; nml bowel sounds; no organomegaly or masses detected.  Musco: Warm bil, no deformities or joint swelling noted.   Neuro: alert, no focal deficits noted.    Skin: Warm, no lesions or rashes         Assessment & Plan:

## 2012-03-27 NOTE — Assessment & Plan Note (Addendum)
LLL CAP , clinically stable  Pt is on home O2 w/ sats at 100%  CXR 03/26/12 w/ LLL infiltrate  Exam is unrevealing w/ good airflow , no distress noted.  Mucus is minimally blood tinged without frank hemoptysis .  At this time bronchoscopy does not appear to be indicated  Case discussed in detail with Dr. Craige Cotta   Albuterol inhaler sample was given at today's visit per request .  Pt education provided in detail    Plan Finish Levaquin as directed. Begin Mucinex DM twice daily. Continue on Pulmicort nebulizer twice daily Uses DuoNeb nebulizer every 6 hours until seen back in, the office in 4 days Continue on prednisone 20 mg daily Avoid aspirin products for now Follow up with Dr. Craige Cotta  4 days, and as needed Please contact office for sooner follow up if symptoms do not improve or worsen or seek emergency care '

## 2012-03-29 ENCOUNTER — Telehealth: Payer: Self-pay | Admitting: Pulmonary Disease

## 2012-03-29 NOTE — Telephone Encounter (Signed)
C/o sore throat & aching ears, has 2 more days of levaquin for LLL CAP. Advised - symptomatic Rx -lozenges, salt water gargles, no nasal congestion, has FU appt on monday

## 2012-03-31 ENCOUNTER — Ambulatory Visit (INDEPENDENT_AMBULATORY_CARE_PROVIDER_SITE_OTHER): Payer: Medicaid Other | Admitting: Pulmonary Disease

## 2012-03-31 ENCOUNTER — Other Ambulatory Visit (INDEPENDENT_AMBULATORY_CARE_PROVIDER_SITE_OTHER): Payer: Medicaid Other

## 2012-03-31 ENCOUNTER — Encounter: Payer: Self-pay | Admitting: Pulmonary Disease

## 2012-03-31 VITALS — BP 130/68 | HR 103 | Temp 98.4°F | Ht 62.0 in | Wt 267.2 lb

## 2012-03-31 DIAGNOSIS — J9611 Chronic respiratory failure with hypoxia: Secondary | ICD-10-CM

## 2012-03-31 DIAGNOSIS — G4733 Obstructive sleep apnea (adult) (pediatric): Secondary | ICD-10-CM

## 2012-03-31 DIAGNOSIS — J45909 Unspecified asthma, uncomplicated: Secondary | ICD-10-CM

## 2012-03-31 DIAGNOSIS — R0902 Hypoxemia: Secondary | ICD-10-CM

## 2012-03-31 DIAGNOSIS — J329 Chronic sinusitis, unspecified: Secondary | ICD-10-CM

## 2012-03-31 DIAGNOSIS — IMO0002 Reserved for concepts with insufficient information to code with codable children: Secondary | ICD-10-CM

## 2012-03-31 DIAGNOSIS — J189 Pneumonia, unspecified organism: Secondary | ICD-10-CM

## 2012-03-31 DIAGNOSIS — J9610 Chronic respiratory failure, unspecified whether with hypoxia or hypercapnia: Secondary | ICD-10-CM

## 2012-03-31 LAB — COMPREHENSIVE METABOLIC PANEL
Alkaline Phosphatase: 79 U/L (ref 39–117)
BUN: 14 mg/dL (ref 6–23)
Glucose, Bld: 112 mg/dL — ABNORMAL HIGH (ref 70–99)
Sodium: 138 mEq/L (ref 135–145)
Total Bilirubin: 0.4 mg/dL (ref 0.3–1.2)

## 2012-03-31 MED ORDER — LEVOFLOXACIN 750 MG PO TABS: 750.0000 mg | ORAL_TABLET | Freq: Every day | ORAL | Status: DC

## 2012-03-31 NOTE — Progress Notes (Signed)
Chief Complaint  Patient presents with  . Follow-up    Pt reports still having cough would like another round of levaquin (has sputum sample), would like to discuss bronchoscopy--having vision disturbances esp at night     History of Present Illness: Cheyenne Arias is a 37 y.o. female with asthma, OSA, OHS.  She was seen by Tammy last week for LLL pneumonia, and started on levaquin.  She remains on prednisone 20 mg per day.  She is still having cough and coughing up sputum with plugs.  She brings a jar of water with a large plug in it.  Her mother is very concerned that she has something in her lung that needs to be taken out with a bronchoscope.  She was told in the ER that she needed to have a bronchoscope.  She is not having fever, but still gets winded easily.  She is not having wheeze or sinus pressure.  She is doing well with CPAP.  She remains on oxygen 24/7.  Tests: PSG 03/27/11>>AHI 10.9 Echo 09/20/11>>mod LVH, EF 65 to 70%, small/mod pericardial effusion  Doppler legs 09/21/11>>no DVT CT chest 09/22/11>>no PE, Lt base ATX/ASD, borderline cardiomegaly Spirometry 01/10/12>>FEV1 1.72 (71%), FEV1% 77 CT sinus 02/28/12>>clear paranasal sinuses  Past Medical History  Diagnosis Date  . Hypertension   . Migraine   . Anxiety   . Abnormal Pap smear   . Anemia   . Fibroids   . Fibromyalgia     degenerative disc disease  . Depression   . Sleep apnea   . Shortness of breath   . On home oxygen therapy     2 liters [place]  . PONV (postoperative nausea and vomiting)   . Low iron     pt states low iron-for Feraheme today in [facility]  . Morbid obesity with BMI of 45.0-49.9, adult   . Asthma     recent admission with exacerbation of asthma 09/15/11    Past Surgical History  Procedure Date  . Wisdom tooth extraction   . Mandible surgery 2008  . Carpal tunnel release   . Cesarean section     1994, 1996   . Cesarean section 11/28/2011    Procedure: CESAREAN SECTION;  Surgeon: Adam Phenix, MD;  Location: [facility];  Service: Obstetrics;  Laterality: N/A;    Outpatient Encounter Prescriptions as of 03/31/2012  Medication Sig Dispense Refill  . albuterol (PROVENTIL HFA;VENTOLIN HFA) 108 (90 BASE) MCG/ACT inhaler Inhale 2 puffs into the lungs every 6 (six) hours as needed. for asthma/shortness of breath  1 Inhaler  5  . amLODipine (NORVASC) 10 MG tablet Take 1 tablet (10 mg total) by mouth daily.  31 tablet  6  . cyclobenzaprine (FLEXERIL) 10 MG tablet Take 10 mg by mouth 3 (three) times daily as needed. For muscle spasms      . diphenhydramine-acetaminophen (TYLENOL PM) 25-500 MG TABS Take 1 tablet by mouth at bedtime as needed. for sleep      . ferrous sulfate 325 (65 FE) MG tablet Take 1 tablet (325 mg total) by mouth daily with breakfast.  30 tablet  1  . fluticasone (FLONASE) 50 MCG/ACT nasal spray Place 2 sprays into the nose 2 (two) times daily.  16 g  12  . furosemide (LASIX) 40 MG tablet Take 1 tablet (40 mg total) by mouth daily.  10 tablet  1  . ibuprofen (ADVIL,MOTRIN) 800 MG tablet Take 1 tablet (800 mg total) by mouth 3 (three) times  daily.  21 tablet  0  . ipratropium-albuterol (DUONEB) 0.5-2.5 (3) MG/3ML SOLN Take 3 mLs by nebulization every 4 (four) hours as needed. For asthma symptoms      . levofloxacin (LEVAQUIN) 750 MG tablet Take 1 tablet (750 mg total) by mouth daily.  5 tablet  0  . loratadine (CLARITIN) 10 MG tablet Take 1 tablet (10 mg total) by mouth daily.  31 tablet  12  . LORazepam (ATIVAN) 1 MG tablet Take 1 tablet (1 mg total) by mouth 3 (three) times daily as needed for anxiety.  5 tablet  0  . metoprolol tartrate (LOPRESSOR) 25 MG tablet Take 1 tablet (25 mg total) by mouth 2 (two) times daily.  360 tablet  6  . montelukast (SINGULAIR) 10 MG tablet Take 1 tablet (10 mg total) by mouth at bedtime.  30 tablet  5  . oxyCODONE-acetaminophen (PERCOCET/ROXICET) 5-325 MG per tablet Take 2 tablets by mouth every 4 (four) hours as needed for pain.  15  tablet  0  . potassium chloride SA (K-DUR,KLOR-CON) 20 MEQ tablet Take 1 tablet (20 mEq total) by mouth daily.  10 tablet  1  . predniSONE (DELTASONE) 20 MG tablet Take 1 tablet (20 mg total) by mouth daily.  30 tablet  1  . Prenatal Vit-Fe Fumarate-FA (PRENATAL MULTIVITAMIN) TABS Take 1 tablet by mouth every morning.      [name] PULMICORT 0.5 MG/2ML nebulizer solution TAKE 1 VIAL (0.5 MG TOTAL) BY NEBULIZATION 2 (TWO) TIMES DAILY.  60 mL  3  . senna-docusate (SENOKOT-S) 8.6-50 MG per tablet Take 2 tablets by mouth at bedtime.  30 tablet  0  . [DISCONTINUED] albuterol (PROVENTIL HFA;VENTOLIN HFA) 108 (90 BASE) MCG/ACT inhaler Inhale 2 puffs into the lungs every 6 (six) hours as needed for wheezing.  8 g  0    Allergies  Allergen Reactions  . Latex Itching and Swelling    Blisters  . Morphine And Related Itching    Physical Exam:  Filed Vitals:   03/31/12 1613  BP: 130/68  Pulse: 103  Temp: 98.4 F (36.9 C)  TempSrc: Oral  Height: 5\' 2"  (1.575 m)  Weight: 267 lb 3.2 oz (121.201 kg)  SpO2: 98%    Body mass index is 48.87 kg/(m^2).   Wt Readings from Last 2 Encounters:  03/31/12 267 lb 3.2 oz (121.201 kg)  03/27/12 270 lb 9.6 oz (122.743 kg)    General - no distress, using oxygen HEENT - no sinus tenderness, no oral exudate, no LAN Cardiac - s1s2 tachycardic, no murmur Chest - prolonged exhalation, no wheezing, faint rales at left lung base Abd - obese, soft, non-tender Ext - no edema Neuro - normal strength Psych - normal mood behavior   Assessment/Plan:  [name], MD [place] [facility] Pager:  [phone] 03/31/2012, 4:20 PM

## 2012-03-31 NOTE — Patient Instructions (Signed)
Take one more week of levaquin Lab work today Will get sample of sputum Will schedule CT chest  Follow up in 2 to 3 weeks

## 2012-04-01 LAB — CBC WITH DIFFERENTIAL/PLATELET
Basophils Relative: 2 % (ref 0.0–3.0)
Eosinophils Relative: 0 % (ref 0.0–5.0)
HCT: 29.6 % — ABNORMAL LOW (ref 36.0–46.0)
Hemoglobin: 9.1 g/dL — ABNORMAL LOW (ref 12.0–15.0)
MCV: 62 fl — ABNORMAL LOW (ref 78.0–100.0)
RBC: 4.78 Mil/uL (ref 3.87–5.11)
WBC: 19.2 10*3/uL (ref 4.5–10.5)

## 2012-04-02 NOTE — Assessment & Plan Note (Signed)
Likely related to obesity hypoventilation, and pneumonia.  She is to continue supplemental oxygen 24/7 for now.  Will re-assess her need for supplemental oxygen when she is in a chronic, stable condition.

## 2012-04-02 NOTE — Assessment & Plan Note (Signed)
She is to to continue CPAP at night.

## 2012-04-02 NOTE — Assessment & Plan Note (Signed)
She is to continue on her nebulizer regimen, singulair, and prednisone for now.

## 2012-04-02 NOTE — Assessment & Plan Note (Signed)
Recent CT sinus was unrevealing.  She is to continue flonase, and claritin.

## 2012-04-02 NOTE — Assessment & Plan Note (Signed)
She continues to have symptoms of pneumonia.  This has been associated with coughing up mucus plugs.  She has been steroid dependent since the past several months.  I am concerned she could have a non-infectious cause to her pulmonary infiltrate that has been partially masked by chronic prednisone therapy, and that she may need bronchoscopy with airway sampling to further assess.  Will extend her course of levaquin for an additional one week.  Will check lab tests today.  Will also need to get CT chest with contrast to better assess airways and lung parenchyma, and then determine if bronchoscopy is needed.

## 2012-04-03 ENCOUNTER — Ambulatory Visit (INDEPENDENT_AMBULATORY_CARE_PROVIDER_SITE_OTHER)
Admission: RE | Admit: 2012-04-03 | Discharge: 2012-04-03 | Disposition: A | Discharge status: Home / Self Care | Payer: Medicaid Other | Source: Ambulatory Visit | Attending: Pulmonary Disease | Admitting: Pulmonary Disease

## 2012-04-03 ENCOUNTER — Telehealth: Payer: Self-pay | Admitting: Pulmonary Disease

## 2012-04-03 DIAGNOSIS — J189 Pneumonia, unspecified organism: Secondary | ICD-10-CM

## 2012-04-03 MED ORDER — IOHEXOL 300 MG/ML  SOLN
80.0000 mL | Freq: Once | INTRAMUSCULAR | Status: AC | PRN
  Administered 2012-04-03: 80 mL via INTRAVENOUS

## 2012-04-03 NOTE — Telephone Encounter (Signed)
04/03/2012  *RADIOLOGY REPORT*   Clinical Data: Asthma.  Persistent pneumonia.  Rule out mass.   CT CHEST WITH CONTRAST   Technique:  Multidetector CT imaging of the chest was performed following the standard protocol during bolus administration of intravenous contrast.  Contrast: 80mL OMNIPAQUE IOHEXOL 300 MG/ML  SOLN   Comparison: chest radiograph on 03/26/2012 and chest CTA on 09/22/2011   Findings: There is persistent and mildly increased airspace disease in the superior segment of the left lower lobe which shows central air bronchograms.  This remains suspicious for pneumonia, with no evidence of central endobronchial obstruction or central hilar mass or lymphadenopathy identified.  Mild subsegmental atelectasis or scarring in the superior segment right lower lobe is increased, but there is no evidence of airspace disease.  No discrete pulmonary nodules or masses are identified.  No evidence of pleural or pericardial effusion.  No adenopathy seen elsewhere within the thorax.  Adrenal glands are normal in appearance.   IMPRESSION:   1.  Mild increase in air space disease in the superior segment left lower lobe.  This continues to show central air bronchograms, and no central endobronchial lesion or obstructing mass or lymphadenopathy is identified by CT.  Consider bronchoscopy vs. continued radiographic followup.  2.  Mild subsegmental atelectasis or scarring in the superior segment of the right lower lobe. 3.  No mass or lymphadenopathy identified.    Original Report Authenticated By: Myles Rosenthal, M.D.     [facility]     Component Value Date/Time   NA 138 03/31/2012 1709   K 3.8 03/31/2012 1709   CL 99 03/31/2012 1709   CO2 26 03/31/2012 1709   GLUCOSE 112* 03/31/2012 1709   BUN 14 03/31/2012 1709   CREATININE 0.9 03/31/2012 1709   CALCIUM 9.6 03/31/2012 1709   PROT 8.8* 03/31/2012 1709   ALBUMIN 3.8 03/31/2012 1709   AST 18 03/31/2012 1709   ALT 23 03/31/2012 1709   ALKPHOS 79 03/31/2012  1709   BILITOT 0.4 03/31/2012 1709    CBC    Component Value Date/Time   WBC 19.2 Repeated and verified X2.* 03/31/2012 1709   RBC 4.78 03/31/2012 1709   HGB 9.1* 03/31/2012 1709   HCT 29.6* 03/31/2012 1709   PLT 601.0 Repeated and verified X2.* 03/31/2012 1709   MCV 62.0 Repeated and verified X2.* 03/31/2012 1709   MCHC 30.8 03/31/2012 1709   RDW 18.2* 03/31/2012 1709    Attempted to contact pt at listed numbers, but no answer and no answering machine/voicemail.  Will discuss in more detail at [facility] 04/11/12.

## 2012-04-05 ENCOUNTER — Telehealth: Payer: Self-pay | Admitting: Oncology

## 2012-04-05 NOTE — Telephone Encounter (Signed)
LVOM for pt to return call in re to an r/s in appt.  Pt schedule to see Misty Stanley 1/10 @ 11:45 [place]/LIsa.

## 2012-04-07 ENCOUNTER — Other Ambulatory Visit: Payer: Self-pay | Admitting: Family Medicine

## 2012-04-07 DIAGNOSIS — E237 Disorder of pituitary gland, unspecified: Secondary | ICD-10-CM

## 2012-04-08 ENCOUNTER — Emergency Department (HOSPITAL_BASED_OUTPATIENT_CLINIC_OR_DEPARTMENT_OTHER)
Admission: EM | Admit: 2012-04-08 | Discharge: 2012-04-08 | Disposition: A | Discharge status: Home / Self Care | Payer: Medicaid Other | Attending: Emergency Medicine | Admitting: Emergency Medicine

## 2012-04-08 ENCOUNTER — Encounter (HOSPITAL_BASED_OUTPATIENT_CLINIC_OR_DEPARTMENT_OTHER): Payer: Self-pay | Admitting: *Deleted

## 2012-04-08 DIAGNOSIS — J45909 Unspecified asthma, uncomplicated: Secondary | ICD-10-CM | POA: Insufficient documentation

## 2012-04-08 DIAGNOSIS — N898 Other specified noninflammatory disorders of vagina: Secondary | ICD-10-CM | POA: Insufficient documentation

## 2012-04-08 DIAGNOSIS — F3289 Other specified depressive episodes: Secondary | ICD-10-CM | POA: Insufficient documentation

## 2012-04-08 DIAGNOSIS — F411 Generalized anxiety disorder: Secondary | ICD-10-CM | POA: Insufficient documentation

## 2012-04-08 DIAGNOSIS — Z3202 Encounter for pregnancy test, result negative: Secondary | ICD-10-CM | POA: Insufficient documentation

## 2012-04-08 DIAGNOSIS — N760 Acute vaginitis: Secondary | ICD-10-CM

## 2012-04-08 DIAGNOSIS — Z8709 Personal history of other diseases of the respiratory system: Secondary | ICD-10-CM | POA: Insufficient documentation

## 2012-04-08 DIAGNOSIS — I10 Essential (primary) hypertension: Secondary | ICD-10-CM | POA: Insufficient documentation

## 2012-04-08 DIAGNOSIS — D649 Anemia, unspecified: Secondary | ICD-10-CM | POA: Insufficient documentation

## 2012-04-08 DIAGNOSIS — Z9981 Dependence on supplemental oxygen: Secondary | ICD-10-CM | POA: Insufficient documentation

## 2012-04-08 DIAGNOSIS — F329 Major depressive disorder, single episode, unspecified: Secondary | ICD-10-CM | POA: Insufficient documentation

## 2012-04-08 DIAGNOSIS — B373 Candidiasis of vulva and vagina: Secondary | ICD-10-CM | POA: Insufficient documentation

## 2012-04-08 DIAGNOSIS — Z79899 Other long term (current) drug therapy: Secondary | ICD-10-CM | POA: Insufficient documentation

## 2012-04-08 DIAGNOSIS — B3731 Acute candidiasis of vulva and vagina: Secondary | ICD-10-CM | POA: Insufficient documentation

## 2012-04-08 DIAGNOSIS — Z832 Family history of diseases of the blood and blood-forming organs and certain disorders involving the immune mechanism: Secondary | ICD-10-CM | POA: Insufficient documentation

## 2012-04-08 DIAGNOSIS — IMO0001 Reserved for inherently not codable concepts without codable children: Secondary | ICD-10-CM | POA: Insufficient documentation

## 2012-04-08 DIAGNOSIS — Z6841 Body Mass Index (BMI) 40.0 and over, adult: Secondary | ICD-10-CM | POA: Insufficient documentation

## 2012-04-08 DIAGNOSIS — Z87410 Personal history of cervical dysplasia: Secondary | ICD-10-CM | POA: Insufficient documentation

## 2012-04-08 DIAGNOSIS — Z8679 Personal history of other diseases of the circulatory system: Secondary | ICD-10-CM | POA: Insufficient documentation

## 2012-04-08 DIAGNOSIS — G4730 Sleep apnea, unspecified: Secondary | ICD-10-CM | POA: Insufficient documentation

## 2012-04-08 LAB — WET PREP, GENITAL: Trich, Wet Prep: NONE SEEN

## 2012-04-08 LAB — URINALYSIS, ROUTINE W REFLEX MICROSCOPIC
Bilirubin Urine: NEGATIVE
Glucose, UA: NEGATIVE mg/dL
Ketones, ur: NEGATIVE mg/dL
Nitrite: NEGATIVE
Protein, ur: 30 mg/dL — AB
Specific Gravity, Urine: 1.019 (ref 1.005–1.030)
Urobilinogen, UA: 0.2 mg/dL (ref 0.0–1.0)
pH: 7.5 (ref 5.0–8.0)

## 2012-04-08 LAB — URINE MICROSCOPIC-ADD ON

## 2012-04-08 LAB — PREGNANCY, URINE: Preg Test, Ur: NEGATIVE

## 2012-04-08 MED ORDER — FLUCONAZOLE 200 MG PO TABS: 200.0000 mg | ORAL_TABLET | Freq: Every day | ORAL | Status: DC

## 2012-04-08 MED ORDER — NYSTATIN 100000 UNIT/GM EX CREA: TOPICAL_CREAM | CUTANEOUS | Status: DC

## 2012-04-08 NOTE — ED Notes (Signed)
Pt amb to room 11 with quick steady gait in nad. Pt reports she has been on atbx for pneumonia and folliculitis and now has a yeast infection. Pt reports vaginal itching and "cottage cheese discharge."

## 2012-04-08 NOTE — ED Provider Notes (Signed)
History     CSN: [id number]  Arrival date & time 04/08/12  7829   First MD Initiated Contact with Patient 04/08/12 0735      Chief Complaint  Patient presents with  . Vaginal Itching    (Consider location/radiation/quality/duration/timing/severity/associated sxs/prior treatment) HPI Comments: Patient believes that she has a yeast infection. She has been on antibiotics recently for a folliculitis as well as a second antibiotic for pneumonia. She states she's had about a one-week history of vaginal itching and thick white discharge. She's had a history of yeast infections in the past and feels similar to this. She denies any urinary symptoms. Her last menstrual cycle was December 8. She denies abdominal pain. She denies he nausea vomiting or diarrhea. She denies he fevers or chills.  Patient is a 36 y.o. female presenting with vaginal itching.  Vaginal Itching Pertinent negatives include no chest pain, no abdominal pain, no headaches and no shortness of breath.    Past Medical History  Diagnosis Date  . Hypertension   . Migraine   . Anxiety   . Abnormal Pap smear   . Anemia   . Fibroids   . Fibromyalgia     degenerative disc disease  . Depression   . Sleep apnea   . Shortness of breath   . On home oxygen therapy     2 liters [place]  . PONV (postoperative nausea and vomiting)   . Low iron     pt states low iron-for Feraheme today in [facility]  . Morbid obesity with BMI of 45.0-49.9, adult   . Asthma     recent admission with exacerbation of asthma 09/15/11    Past Surgical History  Procedure Date  . Wisdom tooth extraction   . Mandible surgery 2008  . Carpal tunnel release   . Cesarean section     1994, 1996   . Cesarean section 11/28/2011    Procedure: CESAREAN SECTION;  Surgeon: Adam Phenix, MD;  Location: [facility];  Service: Obstetrics;  Laterality: N/A;    Family History  Problem Relation Age of Onset  . Sickle cell trait Maternal Aunt   . Sickle cell anemia Other      History  Substance Use Topics  . Smoking status: Never Smoker   . Smokeless tobacco: Never Used     Comment: Pt was exposed to 2nd hand smoke at work years ago.  . Alcohol Use: No    OB History    Grav Para Term Preterm Abortions TAB SAB Ect Mult Living   3 3 3       3       Review of Systems  Constitutional: Negative for fever, chills, diaphoresis and fatigue.  HENT: Negative for congestion, rhinorrhea and sneezing.   Eyes: Negative.   Respiratory: Negative for cough, chest tightness and shortness of breath.   Cardiovascular: Negative for chest pain and leg swelling.  Gastrointestinal: Negative for nausea, vomiting, abdominal pain, diarrhea and blood in stool.  Genitourinary: Positive for vaginal discharge. Negative for frequency, hematuria, flank pain, vaginal bleeding and difficulty urinating.  Musculoskeletal: Negative for back pain and arthralgias.  Skin: Negative for rash.  Neurological: Negative for dizziness, speech difficulty, weakness, numbness and headaches.    Allergies  Latex and Morphine and related  Home Medications   Current Outpatient Rx  Name  Route  Sig  Dispense  Refill  . ALBUTEROL SULFATE HFA 108 (90 BASE) MCG/ACT IN AERS   Inhalation   Inhale 2 puffs into  the lungs every 6 (six) hours as needed. for asthma/shortness of breath   1 Inhaler   5   . AMLODIPINE BESYLATE 10 MG PO TABS   Oral   Take 1 tablet (10 mg total) by mouth daily.   31 tablet   6   . CYCLOBENZAPRINE HCL 10 MG PO TABS   Oral   Take 10 mg by mouth 3 (three) times daily as needed. For muscle spasms         . DIPHENHYDRAMINE-APAP (SLEEP) 25-500 MG PO TABS   Oral   Take 1 tablet by mouth at bedtime as needed. for sleep         . FERROUS SULFATE 325 (65 FE) MG PO TABS   Oral   Take 1 tablet (325 mg total) by mouth daily with breakfast.   30 tablet   1   . FLUCONAZOLE 200 MG PO TABS   Oral   Take 1 tablet (200 mg total) by mouth daily. Take one by mouth.  If no  improvement, may repeat in one week.   2 tablet   0   . FLUTICASONE PROPIONATE 50 MCG/ACT NA SUSP   Nasal   Place 2 sprays into the nose 2 (two) times daily.   16 g   12   . FUROSEMIDE 40 MG PO TABS   Oral   Take 1 tablet (40 mg total) by mouth daily.   10 tablet   1   . IBUPROFEN 800 MG PO TABS   Oral   Take 1 tablet (800 mg total) by mouth 3 (three) times daily.   21 tablet   0   . IPRATROPIUM-ALBUTEROL 0.5-2.5 (3) MG/3ML IN SOLN   Nebulization   Take 3 mLs by nebulization every 4 (four) hours as needed. For asthma symptoms         . LEVOFLOXACIN 750 MG PO TABS   Oral   Take 1 tablet (750 mg total) by mouth daily.   5 tablet   0   . LEVOFLOXACIN 750 MG PO TABS   Oral   Take 1 tablet (750 mg total) by mouth daily.   7 tablet   0   . LORATADINE 10 MG PO TABS   Oral   Take 1 tablet (10 mg total) by mouth daily.   31 tablet   12   . LORAZEPAM 1 MG PO TABS   Oral   Take 1 tablet (1 mg total) by mouth 3 (three) times daily as needed for anxiety.   5 tablet   0   . METOPROLOL TARTRATE 25 MG PO TABS   Oral   Take 1 tablet (25 mg total) by mouth 2 (two) times daily.   360 tablet   6   . MONTELUKAST SODIUM 10 MG PO TABS   Oral   Take 1 tablet (10 mg total) by mouth at bedtime.   30 tablet   5   . NYSTATIN 100000 UNIT/GM EX CREA      Apply to affected area 2 times daily   15 g   0   . OXYCODONE-ACETAMINOPHEN 5-325 MG PO TABS   Oral   Take 2 tablets by mouth every 4 (four) hours as needed for pain.   15 tablet   0   . POTASSIUM CHLORIDE CRYS ER 20 MEQ PO TBCR   Oral   Take 1 tablet (20 mEq total) by mouth daily.   10 tablet   1   . PREDNISONE  20 MG PO TABS   Oral   Take 1 tablet (20 mg total) by mouth daily.   30 tablet   1   . PRENATAL MULTIVITAMIN CH   Oral   Take 1 tablet by mouth every morning.         [name] PULMICORT 0.5 MG/2ML IN SUSP      TAKE 1 VIAL (0.5 MG TOTAL) BY NEBULIZATION 2 (TWO) TIMES DAILY.   60 mL   3   .  SENNOSIDES-DOCUSATE SODIUM 8.6-50 MG PO TABS   Oral   Take 2 tablets by mouth at bedtime.   30 tablet   0     BP 128/72  Pulse 100  Temp 98.7 F (37.1 C) (Oral)  Resp 18  SpO2 100%  LMP 03/25/2012  Physical Exam  Constitutional: She is oriented to person, place, and time. She appears well-developed and well-nourished.  HENT:  Head: Normocephalic and atraumatic.  Eyes: Pupils are equal, round, and reactive to light.  Neck: Normal range of motion. Neck supple.  Cardiovascular: Normal rate, regular rhythm and normal heart sounds.   Pulmonary/Chest: Effort normal and breath sounds normal. No respiratory distress. She has no wheezes. She has no rales. She exhibits no tenderness.  Abdominal: Soft. Bowel sounds are normal. There is no tenderness. There is no rebound and no guarding.  Genitourinary: Vaginal discharge (thick, white.  no CMT/adnexal tenderness) found.  Musculoskeletal: Normal range of motion. She exhibits no edema.  Lymphadenopathy:    She has no cervical adenopathy.  Neurological: She is alert and oriented to person, place, and time.  Skin: Skin is warm and dry. No rash noted.  Psychiatric: She has a normal mood and affect.    ED Course  Procedures (including critical care time)  Results for orders placed during the hospital encounter of 04/08/12  WET PREP, GENITAL      Component Value Range   Yeast Wet Prep HPF POC MODERATE (*) NONE SEEN   Trich, Wet Prep NONE SEEN  NONE SEEN   Clue Cells Wet Prep HPF POC FEW (*) NONE SEEN   WBC, Wet Prep HPF POC FEW (*) NONE SEEN  URINALYSIS, ROUTINE W REFLEX MICROSCOPIC      Component Value Range   Color, Urine YELLOW  YELLOW   APPearance TURBID (*) CLEAR   Specific Gravity, Urine 1.019  1.005 - 1.030   pH 7.5  5.0 - 8.0   Glucose, UA NEGATIVE  NEGATIVE mg/dL   Hgb urine dipstick TRACE (*) NEGATIVE   Bilirubin Urine NEGATIVE  NEGATIVE   Ketones, ur NEGATIVE  NEGATIVE mg/dL   Protein, ur 30 (*) NEGATIVE mg/dL    Urobilinogen, UA 0.2  0.0 - 1.0 mg/dL   Nitrite NEGATIVE  NEGATIVE   Leukocytes, UA LARGE (*) NEGATIVE  PREGNANCY, URINE      Component Value Range   Preg Test, Ur NEGATIVE  NEGATIVE  URINE MICROSCOPIC-ADD ON      Component Value Range   Squamous Epithelial / LPF MANY (*) RARE   WBC, UA 7-10  <3 WBC/hpf   RBC / HPF 0-2  <3 RBC/hpf   Bacteria, UA MANY (*) RARE   Urine-Other FEW YEAST      No results found.   1. Vaginitis       MDM  Patient with vaginal yeast infection. Per patient's request was given prescription for Diflucan and nystatin cream. Her urine appeared to be a dirty specimen and she's not symptomatic for a UTI. I did  send it for culture however. I did not start antibiotics given that she's not symptomatic and she's been on antibiotics recently. She has appointment to followup with her OB/GYN on Friday        Rolan Bucco, MD 04/08/12 [phone]

## 2012-04-09 LAB — URINE CULTURE: Colony Count: 80000

## 2012-04-11 ENCOUNTER — Ambulatory Visit (INDEPENDENT_AMBULATORY_CARE_PROVIDER_SITE_OTHER): Payer: Medicaid Other | Admitting: Family Medicine

## 2012-04-11 ENCOUNTER — Ambulatory Visit (INDEPENDENT_AMBULATORY_CARE_PROVIDER_SITE_OTHER): Payer: Medicaid Other | Admitting: Pulmonary Disease

## 2012-04-11 ENCOUNTER — Encounter: Payer: Self-pay | Admitting: Family Medicine

## 2012-04-11 ENCOUNTER — Encounter: Payer: Self-pay | Admitting: Pulmonary Disease

## 2012-04-11 ENCOUNTER — Other Ambulatory Visit (HOSPITAL_COMMUNITY)
Admission: RE | Admit: 2012-04-11 | Discharge: 2012-04-11 | Disposition: A | Discharge status: Home / Self Care | Payer: Medicaid Other | Source: Ambulatory Visit | Attending: Family Medicine | Admitting: Family Medicine

## 2012-04-11 VITALS — BP 130/74 | HR 102 | Temp 98.4°F | Ht 62.0 in | Wt 265.0 lb

## 2012-04-11 VITALS — BP 130/84 | HR 77 | Temp 97.7°F | Ht 62.0 in | Wt 269.0 lb

## 2012-04-11 DIAGNOSIS — J45909 Unspecified asthma, uncomplicated: Secondary | ICD-10-CM

## 2012-04-11 DIAGNOSIS — B373 Candidiasis of vulva and vagina: Secondary | ICD-10-CM

## 2012-04-11 DIAGNOSIS — G4733 Obstructive sleep apnea (adult) (pediatric): Secondary | ICD-10-CM

## 2012-04-11 DIAGNOSIS — N870 Mild cervical dysplasia: Secondary | ICD-10-CM | POA: Insufficient documentation

## 2012-04-11 DIAGNOSIS — J9610 Chronic respiratory failure, unspecified whether with hypoxia or hypercapnia: Secondary | ICD-10-CM

## 2012-04-11 DIAGNOSIS — R87811 Vaginal high risk human papillomavirus (HPV) DNA test positive: Secondary | ICD-10-CM

## 2012-04-11 DIAGNOSIS — IMO0002 Reserved for concepts with insufficient information to code with codable children: Secondary | ICD-10-CM

## 2012-04-11 DIAGNOSIS — B3731 Acute candidiasis of vulva and vagina: Secondary | ICD-10-CM

## 2012-04-11 DIAGNOSIS — R0902 Hypoxemia: Secondary | ICD-10-CM

## 2012-04-11 DIAGNOSIS — J189 Pneumonia, unspecified organism: Secondary | ICD-10-CM

## 2012-04-11 DIAGNOSIS — J9611 Chronic respiratory failure with hypoxia: Secondary | ICD-10-CM

## 2012-04-11 MED ORDER — LEVOFLOXACIN 750 MG PO TABS: 750.0000 mg | ORAL_TABLET | Freq: Every day | ORAL | Status: DC

## 2012-04-11 MED ORDER — PREDNISONE 10 MG PO TABS: 15.0000 mg | ORAL_TABLET | Freq: Every day | ORAL | Status: DC

## 2012-04-11 MED ORDER — FLUCONAZOLE 150 MG PO TABS: 150.0000 mg | ORAL_TABLET | Freq: Once | ORAL | Status: DC

## 2012-04-11 NOTE — Patient Instructions (Signed)
Levaquin 750 mg daily for one week Change prednisone to 10 mg pill >> take 1.5 pills daily Follow up in 3 weeks with chest xray

## 2012-04-11 NOTE — Patient Instructions (Signed)
Colposcopy Care After Colposcopy is a procedure in which a special tool is used to magnify the surface of the cervix. A tissue sample (biopsy) may also be taken. This sample will be looked at for cervical cancer or other problems. After the test:  You may have some cramping.  Lie down for a few minutes if you feel lightheaded.   You may have some bleeding which should stop in a few days. HOME CARE  Do not have sex or use tampons for 2 to 3 days or as told.  Only take medicine as told by your doctor.  Continue to take your birth control pills as usual. Finding out the results of your test Ask when your test results will be ready. Make sure you get your test results. GET HELP RIGHT AWAY IF:  You are bleeding a lot or are passing blood clots.  You develop a fever of 102 F (38.9 C) or higher.  You have abnormal vaginal discharge.  You have cramps that do not go away with medicine.  You feel lightheaded, dizzy, or pass out (faint). MAKE SURE YOU:   Understand these instructions.  Will watch your condition.  Will get help right away if you are not doing well or get worse. Document Released: 09/19/2007 Document Revised: 06/25/2011 Document Reviewed: 09/19/2007 [facility] Patient Information 2013 [place], [place].  HPV Test The HPV (human papillomavirus) test is used to screen for high-risk types with HPV infection. HPV is a group of about 100 related viruses, of which 40 types are genital viruses. Most HPV viruses cause infections that usually resolve without treatment within 2 years. Some HPV infections can cause skin and genital warts (condylomata). HPV types 16, 18, 31 and 45 are considered high-risk types of HPV. High-risk types of HPV do not usually cause visible warts, but if untreated, may lead to cancers of the outlet of the womb (cervix) or anus. An HPV test identifies the DNA (genetic) strands of the HPV infection. Because the test identifies the DNA strands, the test is  also referred to as the HPV DNA test. Although HPV is found in both males and females, the HPV test is only used to screen for cervical cancer in females. This test is recommended for females:  With an abnormal Pap test.  After treatment of an abnormal Pap test.  Aged 57 and older.  After treatment of a high-risk HPV infection. The HPV test may be done at the same time as a Pap test in females over the age of 75. Both the HPV and Pap test require a sample of cells from the cervix. PREPARATION FOR TEST  You may be asked to avoid douching, tampons, or vaginal medicines for 48 hours before the HPV test. You will be asked to urinate before the test. For the HPV test, you will need to lie on an exam table with your feet in stirrups. A spatula will be inserted into the vagina. The spatula will be used to swab the cervix for a cell and mucus sample. The sample will be further evaluated in a lab under a microscope. NORMAL FINDINGS  Normal: High-risk HPV is not found.  Ranges for normal findings may vary among different laboratories and hospitals. You should always check with your doctor after having lab work or other tests done to discuss the meaning of your test results and whether your values are considered within normal limits. MEANING OF TEST An abnormal HPV test means that high-risk HPV is found. Your caregiver  may recommend further testing. Your caregiver will go over the test results with you. He or she will and discuss the importance and meaning of your results, as well as treatment options and the need for additional tests, if necessary. OBTAINING THE RESULTS  It is your responsibility to obtain your test results. Ask the lab or department performing the test when and how you will get your results. Document Released: 04/27/2004 Document Revised: 06/25/2011 Document Reviewed: 01/10/2005 [facility] Patient Information 2013 [place], [place].  Cervical Cancer The cervix is the opening and bottom  part of the uterus between the birth canal (vagina) and the uterus (womb). Precancerous changes in the cells on the top layer of the cervix are an early sign that cervical cancer may develop. Cervical cancer is a fairly common cancer. It occurs most often in women ages 17 to 12. Cells of the cervix act very much like skin cells. These cells are exposed to toxins, viruses, and germs (bacteria) that may cause abnormal changes (cervical dysplasia).  Cervical dysplasia (abnormal growth) is judged by the thickness of the layer of abnormal cells. The earliest change that can be seen with a microscope is called mild dysplasia. If not treated, these precancerous changes may become moderate to severe, to cancer cells on the surface of the cervix (carcinoma in situ), to invasive cancer (cancer cells below the surface of the cervix). There are two kinds of cancers of the cervix:  Squamous (scale-like) cell carcinoma, starts in the flat or scale-like cells that line the cervix. This type of cancer is a sexually transmitted infection caused by the human papilloma virus (HPV).  Adenocarcinoma (starts on the surface of a gland) starts in glandular cells that line the cervix. CAUSES The risk of getting cancer of the cervix is related to your lifestyle, sexual history, health, and your immune system. Causes and risks include:  Having a sexually transmitted virus infection. These infections are strongly linked with cancer of the cervix. They include:  Chlamydia.  Herpes.  Human papilloma virus (HPV).  Becoming sexually active before age 24.  Having more than 1 sexual partner, or having sex with someone who has more than one sex partner.  Not using condoms with sexual partners.  Having had cancer of the vagina or vulva. Or having sex with someone whose previous sexual partner had cancer of the cervix or cervical dysplasia.  Having a sexual partner who has or had cancer of the penis.  Smoking.  Having a  weakened immune system. For example, HIV or other immune deficiency disorders (organ transplant).  Being the daughter of a woman who took DES (diethylstilbestrol) during pregnancy.  A history of cancer of the cervix in your sister or mother.  African/American, Hispanic, and Asian/Pacific Islander women have a higher risk of getting cervical cancer.  A previous history of dysplasia (abnormal growth) of the cervix. SYMPTOMS  Early stages of cervical cancer usually have no symptoms. Once the cancer invades the cervix and surrounding tissues, the woman may have vague symptoms, such as:  Abnormal vaginal bleeding or menstrual bleeding that is longer or heavier than usual.  Bleeding after intercourse, douching, or a pap test.  Vaginal bleeding following menopause.  Abnormal vaginal discharge.  Pelvic discomfort or pain.  Having an abnormal Pap test. (A Pap test is an exam where cells are scraped from the cervix and canal of the cervix, and are viewed under a microscope.) Symptoms of more advanced cervical cancer may include:  Loss of appetite or weight  loss.  Tiredness (fatigue).  Back and leg pain.  Inability to control urination or bowel movements. DIAGNOSIS  Diagnosis of cervical cancer is found earliest and done best with regular pelvic exams and Pap tests. If abnormalities are found, the Pap test may be repeated in 3 months, or your caregiver may do additional tests, such as:  Colposcopy: A special microscope allows the caregiver to magnify and closely examine the cells of the cervix, vagina, and vulva.  Cervical biopsies: Small tissue samples are taken from the cervix to be examined under a microscope by a specialist. This can be done in your caregiver's office. This is done to determine if there is dysplasia or cancer cells. You cannot tell the stage of cervical cancer with a cervical biopsy. A cone biopsy removes tissue to be tested for cancer. It can also be used to remove  all the cancerous tissue. In a cone biopsy, a large, cone shaped tissue from the cervix is taken. This procedure can be done by:  Cold cone biopsy or laser cone. Both are done in an operating room under an anesthetic (you are asleep).  LEEP (loop electrosurgical excision procedure). Can be done in a doctor's office with a local anesthetic.  Other tests may be needed, including:  Cystoscopy (looking into the bladder with a lighted tube).  Proctoscopy or Sigmoidoscopy (looking into the rectum and lower intestine with a lighted tube).  Ultrasound.  CT Scan.  MRI.  Laparoscopy (using a lighted tube for examination). There are different stages of cervical cancer:   Stage 0. CIS (carcinoma in situ). This first stage of cancer is the last and most serious stage of dysplasia (see above).  Stage 1. The tumor is in the uterus and cervix only.  Stage 2. The tumor has spread to the upper vagina. The cancer has spread beyond the uterus, but not to the pelvic walls or lower third of the vagina.  Stage 3. The tumor has invaded the side wall of the pelvis, and the lower third of the vagina. Blockage of the ureters (tubes that carry urine) from the tumor may cause urine to back up and swell the kidneys (hydronephrosis).  Stage 4. The tumor has spread to the rectum or bladder. In the later part of this stage, it has also spread to distant organs, like the lungs. If abnormal cells are found early, it may be possible to avoid removing the uterus. If caught at an early stage, a woman can still have children and chances for a cure are good. Once cervical cancer reaches a late stage, more aggressive measures may be needed. Untreated, the cancer may spread to nearby areas or more distant sites in the body, through the blood and lymphatic system. Cervical cancer is not contagious and does not pose a risk to others.  TREATMENT  Options for removal include the following:  Cone biopsy (see above).  Removal  of the entire uterus and cervix (simple hysterectomy).  If the cancer has invaded deeper layers of the cervix and has spread to the uterus, more extensive treatment may be needed. This may include removal of the uterus, cervix, upper vagina, lymph nodes, and surrounding tissue (modified radical hysterectomy). This procedure depends on the extent of the cancer and a woman's age. The ovaries may be left in place or removed.  Sometimes medicines for treating cancer (radiation and/or chemotherapy) can be used. This is done when the cancer has spread beyond the cervix and uterus. These treatments may be used  if a woman is not a good candidate for surgery. Age or other medical conditions may prevent a woman from being a good candidate for surgery. Radiation therapy may be used before or after surgery to shrink tumor cells and kill any remaining tumor cells.  A combination of surgery, radiation, and chemotherapy may be needed, depending on the extent of the cancer.  Biological response modifiers (BRMs) are substances that help strengthen the immune system's fight against cancer or infection. Interferon is a BRM that is sometimes used in the treatment of cervical cancer. It may be used in combination with chemotherapy.  Treatment of squamous cell cancer or adenocarcinoma of the cervix are essentially the same. Side effects of treatment:  A hysterectomy may cause inability to control urination or psychological sexual problems. There may be swelling in the legs if lymph nodes are removed. Occasionally, blood transfusions may be required. Allergic reactions can occur. Hemorrhage, infection, and rarely death, can occur.  Chemotherapy and radiation therapy may cause a wide variety of side effects. Including:  Hair loss.  Tiredness (fatigue).  Lessened ability to fight infections.  Feeling sick to your stomach (nausea).  Vomiting.  Diarrhea.  Urinary problems.  Atrophic vaginitis (inflammation of  the vagina) with painful intercourse.  Biological response modifiers such as interferon may cause flu-like symptoms. These include:  Body aches.  Nausea.  Vomiting.  Fatigue. Treatment of cervical cancer in a pregnant woman:  Treatment of cervical cancer in pregnant women depends on the patient's culture, religious feelings, and ethical considerations.  A pregnant woman with cancer at Stage 0 or Stage 1, with microinvasion of 3mm or less (cancerous tissue has spread to a very small area), can deliver her baby vaginally. She can then be treated 6 weeks after the delivery, with surgery.  Other factors that can determine treatment include:  Size of the tumor.  Location of the tumor.  Stage of the pregnancy.  Desire of the patient to go on with the pregnancy.  Radiation with or without chemotherapy, following delivery and/or surgery, may be advised or necessary to prevent the cancerous tumor from coming back (recurrences).  Delaying treatment until the baby has a better chance to survive is better for the baby. Follow-up on cervical cancer:  After treatment, follow-up is important to look for reoccurrence.  A pelvic exam and Pap test, if the cervix is intact, will be done every 3 months for at least 2 years. After the 2 year phase, a pelvic exam and Pap test will be done every 6 months. Because cancer tends to come back at the same spot or spread to the lungs and liver, chest x-rays and liver function tests are done yearly.  If a woman has had a hysterectomy, the top of the vagina is cuffed or closed. A colposcopy may be done at follow-up visits to examine the vaginal cuff.  Tell your caregiver about any new or worsening problems. LENGTH OF ILLNESS The outcome for a woman with cervical cancer depends on many factors, including:  Overall health.  Age when first diagnosed.  The type and growth of specific cancer cells.  How far the disease has spread. After treatment, the  length of time to live depends on the stage of the cancer. Your caregiver will discuss this with you and help you plan your treatment for the best possible outcome. PREVENTION   A Pap test is done to screen for cervical cancer.  The first Pap test should be done at age 48.  Between ages 16 and 76, Pap tests are repeated every 2 years.  Beginning at age 72, you are advised to have a Pap test every 3 years as long as your past 3 Pap tests have been normal.  Some women have medical problems that increase the chance of getting cervical cancer. Talk to your caregiver about these problems. It is especially important to talk to your caregiver if a new problem develops soon after your last Pap test. In these cases, your caregiver may recommend more frequent screening and Pap tests.  The above recommendations are the same for women who have or have not gotten the vaccine for HPV (Human Papillomavirus).  If you had a hysterectomy for a problem that was not a cancer or a condition that could lead to cancer, then you no longer need Pap tests. However, even if you no longer need a Pap test, a regular exam is a good idea to make sure no other problems are starting.   If you are between ages 65 and 79, and you have had normal Pap tests going back 10 years, you no longer need Pap tests. However, even if you no longer need a Pap test, a regular exam is a good idea to make sure no other problems are starting.   If you have had past treatment for cervical cancer or a condition that could lead to cancer, you need Pap tests and screening for cancer for at least 20 years after your treatment.  If Pap tests have been discontinued, risk factors (such as a new sexual partner)need to be re-assessed to determine if screening should be resumed.  Some women may need screenings more often if they are at high risk for cervical cancer.  A woman can lower her risk for getting cervical cancer by:  Quitting  smoking.  Waiting to have intercourse until age 17 or later.  Having only one sexual partner in a lifetime.  Using latex condoms.  Practicing safe sex with each sexual encounter.  Remaining celibate (not having sex). Celibate women do not get squamous cell cancer of the cervix, but they can get adenocarcinoma of the cervix.  A woman should ask her sexual partners about their sexual histories. By doing this, she can avoid partners that are high risk.  Identifying early warning signs of cervical cancer is also important. A woman should see her caregiver, and may need to be treated, if she has any of the following signs or symptoms:  Vaginal discharge that does not seem normal.  Vaginal bleeding between periods.  Bleeding with intercourse or after menopause.  Pain with intercourse (dyspareunia).  Vaccines are available to help prevent certain types of human papilloma virus infection and cervical cancer. HOME CARE INSTRUCTIONS   Get a yearly gynecology examination and Pap test, or as advised by your caregiver.  Get the Human papilloma virus (HPV) vaccine.  Do not smoke.  Tell your caregiver if you have a family history of cancer of the cervix.  Do not have sexual intercourse before age 4.  Practice safe sex:  Use condoms.  Have one sex partner.  Make sure you are the only sex partner of your sex partner. SEEK MEDICAL CARE IF:   You have abnormal vaginal bleeding.  You have abnormal vaginal discharge.  You have vaginal bleeding after sexual intercourse, douching, or using tampons.  You develop vaginal bleeding after menopause.  You have pain with sexual intercourse.  You have unexplained weight loss.  You have unexplained  tiredness.  You feel pressure with urination and/or with a bowel movement. SEEK IMMEDIATE MEDICAL CARE IF:   You have heavy vaginal bleeding, with or without clots.  You cannot urinate, or you have blood in your urine.  You have blood  or pressure with a bowel movement.  You develop severe back, stomach, or pelvic pain.  You develop an unexplained temperature of 100 F (37.9 C), or higher. Document Released: 04/02/2005 Document Revised: 06/25/2011 Document Reviewed: 02/09/2009 [facility] Patient Information 2013 [place], [place].

## 2012-04-11 NOTE — Progress Notes (Signed)
Patient ID: KYLINN SHROPSHIRE, female   DOB: [birth year], 36 y.o.   MRN: [id number]  S: LAIRA PENNINGER is a 36 y.o. [phone] who is 4.5 months postpartum after cesarean section. She is here for a colposcopy in follow up of an abnormal pap smear on 06/13/11 showing ASCUS with high risk HPV. She has not had her post-partum visit. She did come in for an incision check on 12/07/11. She has resumed menses approximately 2 months ago. LMP 03/27/12, normal. She is no longer breastfeeding. She denies any irregular vaginal bleeding or discharge. However, she has been on antibiotics and steroids for treatment of asthma and bronchitis/pneumonia and had a vaginal yeast infection for which she received fluconazole a week ago; her vaginal itching/burning has not completely resolved yet. She states that she had severe post-partum depression and is now on Prozac and is seeing a psychiatrist. Her mood is stable and she feels her depression symptoms are much improved.   She denies any problems with her bowel movements. She does have mild urinary stress and urge incontinence, but these have been going on since before this last pregnancy. She has seen Dr. Stefano Gaul at [facility] in the past for this and will follow up with him. She was referred to [facility] for this pregnancy due to high risk. She reports occasional abdominal pain.  Ms. Braud states that she had an abnormal pap about 17 years ago and thinks she may have had a biopsy taken, but did not require any further treatment. She has a sister who had cervical cancer in her 71s and her maternal grandmother had a hysterectomy for cervical cancer in the last 5 years. A maternal aunt has breast cancer. She does not smoke and has never smoked cigarettes.   Patient Active Problem List  Diagnosis  . Fibromyalgia  . Low back pain  . Obesity  . Fibroids  . Anemia  . Hypertension complicating pregnancy, childbirth and puerperium, antepartum  . Benign essential HTN  .  Supervision of high-risk pregnancy  . ASCUS with positive high risk HPV  . Persistent asthma  . Chronic respiratory failure with hypoxia  . Peripheral edema  . OSA (obstructive sleep apnea)  . Chronic sinusitis  . Pneumonia   Past Surgical History  Procedure Date  . Wisdom tooth extraction   . Mandible surgery 2008  . Carpal tunnel release   . Cesarean section     1994, 1996   . Cesarean section 11/28/2011    Procedure: CESAREAN SECTION;  Surgeon: Adam Phenix, MD;  Location: [facility];  Service: Obstetrics;  Laterality: N/A;   History   Social History  . Marital Status: Single    Spouse Name: N/A    Number of Children: N/A  . Years of Education: N/A   Occupational History  . Not on file.   Social History Main Topics  . Smoking status: Never Smoker   . Smokeless tobacco: Never Used     Comment: Pt was exposed to 2nd hand smoke at work years ago.  . Alcohol Use: No  . Drug Use: No  . Sexually Active: Yes    Birth Control/ Protection: None     Comment: 2 months ago   Other Topics Concern  . Not on file   Social History Narrative  . No narrative on file   Current Outpatient Prescriptions on File Prior to Visit  Medication Sig Dispense Refill  . albuterol (PROVENTIL HFA;VENTOLIN HFA) 108 (90 BASE) MCG/ACT inhaler Inhale 2  puffs into the lungs every 6 (six) hours as needed. for asthma/shortness of breath  1 Inhaler  5  . amLODipine (NORVASC) 10 MG tablet Take 1 tablet (10 mg total) by mouth daily.  31 tablet  6  . cyclobenzaprine (FLEXERIL) 10 MG tablet Take 10 mg by mouth 3 (three) times daily as needed. For muscle spasms      . ferrous sulfate 325 (65 FE) MG tablet Take 1 tablet (325 mg total) by mouth daily with breakfast.  30 tablet  1  . fluticasone (FLONASE) 50 MCG/ACT nasal spray Place 2 sprays into the nose 2 (two) times daily.  16 g  12  . furosemide (LASIX) 40 MG tablet Take 1 tablet (40 mg total) by mouth daily.  10 tablet  1  . ipratropium-albuterol (DUONEB)  0.5-2.5 (3) MG/3ML SOLN Take 3 mLs by nebulization every 4 (four) hours as needed. For asthma symptoms      . loratadine (CLARITIN) 10 MG tablet Take 1 tablet (10 mg total) by mouth daily.  31 tablet  12  . metoprolol tartrate (LOPRESSOR) 25 MG tablet Take 1 tablet (25 mg total) by mouth 2 (two) times daily.  360 tablet  6  . montelukast (SINGULAIR) 10 MG tablet Take 1 tablet (10 mg total) by mouth at bedtime.  30 tablet  5  . nystatin cream (MYCOSTATIN) Apply to affected area 2 times daily  15 g  0  . oxyCODONE-acetaminophen (PERCOCET/ROXICET) 5-325 MG per tablet Take 2 tablets by mouth every 4 (four) hours as needed for pain.  15 tablet  0  . potassium chloride SA (K-DUR,KLOR-CON) 20 MEQ tablet Take 1 tablet (20 mEq total) by mouth daily.  10 tablet  1  . predniSONE (DELTASONE) 20 MG tablet Take 1 tablet (20 mg total) by mouth daily.  30 tablet  1  . PULMICORT 0.5 MG/2ML nebulizer solution TAKE 1 VIAL (0.5 MG TOTAL) BY NEBULIZATION 2 (TWO) TIMES DAILY.  60 mL  3  . senna-docusate (SENOKOT-S) 8.6-50 MG per tablet Take 2 tablets by mouth at bedtime.  30 tablet  0  . diphenhydramine-acetaminophen (TYLENOL PM) 25-500 MG TABS Take 1 tablet by mouth at bedtime as needed. for sleep      . ibuprofen (ADVIL,MOTRIN) 800 MG tablet Take 1 tablet (800 mg total) by mouth 3 (three) times daily.  21 tablet  0  . levofloxacin (LEVAQUIN) 750 MG tablet Take 1 tablet (750 mg total) by mouth daily.  5 tablet  0  . levofloxacin (LEVAQUIN) 750 MG tablet Take 1 tablet (750 mg total) by mouth daily.  7 tablet  0  . LORazepam (ATIVAN) 1 MG tablet Take 1 tablet (1 mg total) by mouth 3 (three) times daily as needed for anxiety.  5 tablet  0  . Prenatal Vit-Fe Fumarate-FA (PRENATAL MULTIVITAMIN) TABS Take 1 tablet by mouth every morning.       Allergies  Allergen Reactions  . Latex Itching and Swelling    Blisters  . Morphine And Related Itching   REVIEW OF SYSTEMS Review of Systems  Constitutional: Negative for fever  and chills.  Gastrointestinal: Negative for nausea, vomiting, abdominal pain, diarrhea and constipation.  Genitourinary: Negative for dysuria, urgency and frequency.  Neurological: Negative for weakness.  See HPI for other pertinent.  Physical Exam  Constitutional: She is oriented to person, place, and time.       Morbidly obese, no distress  HENT:  Head: Normocephalic and atraumatic.  Eyes: Conjunctivae normal and EOM  are normal.  Neck: Normal range of motion.  Cardiovascular: Normal rate.   Pulmonary/Chest: Effort normal. No respiratory distress.       Using oxygen tank with nasal canula.  Abdominal: There is no tenderness. There is no rebound and no guarding.       Obese. Non-tender. C-section incision well healed, no redness, drainage.  Genitourinary:       Normal external genitalia and vagina. No discharge. Cervix normal, no CMT. Bimanual exam limited by body habitus. See further findings under Colposcopy procedure report.  Musculoskeletal: Normal range of motion.  Neurological: She is alert and oriented to person, place, and time.  Skin: Skin is warm and dry.  Psychiatric: She has a normal mood and affect.   Filed Vitals:   04/11/12 1014  BP: 130/74  Pulse: 102  Temp: 98.4 F (36.9 C)    PROCEDURE NOTE: COLPOSCOPY 36 y.o. [id number] here for colposcopy for ASCUS with POSITIVE high risk HPV pap smear on 06/13/11. Discussed role for HPV in cervical dysplasia, need for surveillance. Urine pregnancy test negative.  Patient given informed consent, signed copy in the chart, time out was performed.  Placed in lithotomy position. Cervix viewed with speculum and colposcope after application of acetic acid.   Colposcopy adequate? No, could not visualized entire transformation zone. Acetowhite lesion(s) noted at 2:00 o'clock extending into the endocervical canal, but fully visualized with endocervical speculum; biopsy obtained. Mild acetowhite at 10:00, not biopsied.   ECC specimen  obtained. All specimens were labelled and sent to pathology. Hemostasis obtained with Monsel's solution. Impression:  CIN1.  Patient was given post procedure instructions.  Will follow up pathology and manage accordingly.  Routine preventative health maintenance measures emphasized. Patient will follow up in 3 weeks.  Dr. Debroah Loop present, and agreed with findings.  ASSESSMENT/PLAN  36 y.o. [id number] at 4.5 months postpartum after c-section -  C-section incision healing well.  -  Menses resumed, no longer breastfeeding -  Postpartum depression being treated by psychiatrist, on Prozac -  Asthma:  On prednisone, following with Dr. Craige Cotta. -  Vaginal candidiasis after prolonged antibiotics - repeat fluconazole. - F/U 3 weeks for colposcopy results.  Napoleon Form, MD 04/11/2012 12:06 PM

## 2012-04-11 NOTE — Progress Notes (Signed)
Chief Complaint  Patient presents with  . Follow-up    Breathing is unchanged since last OV. Levaquin has been helping. Sore throat is almost gone. Still coughing with production of green mucus.    History of Present Illness: Cheyenne Arias is a 36 y.o. female with asthma, OSA, OHS.  [phone] home, [phone] cell)  She still has cough, wheeze, and yellow sputum.  These have improved.  She is not having fever, or sweats.  She is now using her nebulizer every 6 hours.  She still gets chest tightness, but better after neb treatments.  She never dropped of sputum sample.  She is doing well with CPAP.  She remains on oxygen 24/7.  Tests: PSG 03/27/11>>AHI 10.9 Echo 09/20/11>>mod LVH, EF 65 to 70%, small/mod pericardial effusion  Doppler legs 09/21/11>>no DVT CT chest 09/22/11>>no PE, Lt base ATX/ASD, borderline cardiomegaly Spirometry 01/10/12>>FEV1 1.72 (71%), FEV1% 77 CT sinus 02/28/12>>clear paranasal sinuses CT chest 04/03/12>>ASD superior segment LLL, RLL ATX  Past Medical History  Diagnosis Date  . Hypertension   . Migraine   . Anxiety   . Abnormal Pap smear   . Anemia   . Fibroids   . Fibromyalgia     degenerative disc disease  . Depression   . Sleep apnea   . Shortness of breath   . On home oxygen therapy     2 liters [place]  . PONV (postoperative nausea and vomiting)   . Low iron     pt states low iron-for Feraheme today in [facility]  . Morbid obesity with BMI of 45.0-49.9, adult   . Asthma     recent admission with exacerbation of asthma 09/15/11    Past Surgical History  Procedure Date  . Wisdom tooth extraction   . Mandible surgery 2008  . Carpal tunnel release   . Cesarean section     1994, 1996   . Cesarean section 11/28/2011    Procedure: CESAREAN SECTION;  Surgeon: Adam Phenix, MD;  Location: [facility];  Service: Obstetrics;  Laterality: N/A;    Outpatient Encounter Prescriptions as of 04/11/2012  Medication Sig Dispense Refill  . albuterol (PROVENTIL  HFA;VENTOLIN HFA) 108 (90 BASE) MCG/ACT inhaler Inhale 2 puffs into the lungs every 6 (six) hours as needed. for asthma/shortness of breath  1 Inhaler  5  . amLODipine (NORVASC) 10 MG tablet Take 1 tablet (10 mg total) by mouth daily.  31 tablet  6  . clonazePAM (KLONOPIN) 0.5 MG tablet Take 0.5 mg by mouth 2 (two) times daily as needed.      . cyclobenzaprine (FLEXERIL) 10 MG tablet Take 10 mg by mouth 3 (three) times daily as needed. For muscle spasms      . diphenhydramine-acetaminophen (TYLENOL PM) 25-500 MG TABS Take 1 tablet by mouth at bedtime as needed. for sleep      . ferrous sulfate 325 (65 FE) MG tablet Take 1 tablet (325 mg total) by mouth daily with breakfast.  30 tablet  1  . fluconazole (DIFLUCAN) 150 MG tablet Take 1 tablet (150 mg total) by mouth once. Take only one tablet today.  If symptoms persist, get second tablet and take in 1 week.  1 tablet  1  . FLUoxetine (PROZAC) 20 MG tablet Take 20 mg by mouth daily.      . fluticasone (FLONASE) 50 MCG/ACT nasal spray Place 2 sprays into the nose 2 (two) times daily.  16 g  12  . furosemide (LASIX) 40 MG tablet Take  1 tablet (40 mg total) by mouth daily.  10 tablet  1  . ibuprofen (ADVIL,MOTRIN) 800 MG tablet Take 1 tablet (800 mg total) by mouth 3 (three) times daily.  21 tablet  0  . ipratropium-albuterol (DUONEB) 0.5-2.5 (3) MG/3ML SOLN Take 3 mLs by nebulization every 4 (four) hours as needed. For asthma symptoms      . levofloxacin (LEVAQUIN) 750 MG tablet Take 1 tablet (750 mg total) by mouth daily.  5 tablet  0  . loratadine (CLARITIN) 10 MG tablet Take 1 tablet (10 mg total) by mouth daily.  31 tablet  12  . LORazepam (ATIVAN) 1 MG tablet Take 1 tablet (1 mg total) by mouth 3 (three) times daily as needed for anxiety.  5 tablet  0  . metoprolol tartrate (LOPRESSOR) 25 MG tablet Take 1 tablet (25 mg total) by mouth 2 (two) times daily.  360 tablet  6  . montelukast (SINGULAIR) 10 MG tablet Take 1 tablet (10 mg total) by mouth at  bedtime.  30 tablet  5  . nystatin cream (MYCOSTATIN) Apply to affected area 2 times daily  15 g  0  . oxyCODONE-acetaminophen (PERCOCET/ROXICET) 5-325 MG per tablet Take 2 tablets by mouth every 4 (four) hours as needed for pain.  15 tablet  0  . potassium chloride SA (K-DUR,KLOR-CON) 20 MEQ tablet Take 1 tablet (20 mEq total) by mouth daily.  10 tablet  1  . predniSONE (DELTASONE) 20 MG tablet Take 1 tablet (20 mg total) by mouth daily.  30 tablet  1  . pregabalin (LYRICA) 75 MG capsule Take 75 mg by mouth 2 (two) times daily.      . Prenatal Vit-Fe Fumarate-FA (PRENATAL MULTIVITAMIN) TABS Take 1 tablet by mouth every morning.      [name] PULMICORT 0.5 MG/2ML nebulizer solution TAKE 1 VIAL (0.5 MG TOTAL) BY NEBULIZATION 2 (TWO) TIMES DAILY.  60 mL  3  . senna-docusate (SENOKOT-S) 8.6-50 MG per tablet Take 2 tablets by mouth at bedtime.  30 tablet  0  . [DISCONTINUED] levofloxacin (LEVAQUIN) 750 MG tablet Take 1 tablet (750 mg total) by mouth daily.  7 tablet  0    Allergies  Allergen Reactions  . Latex Itching and Swelling    Blisters  . Morphine And Related Itching    Physical Exam:  Filed Vitals:   04/11/12 1438  BP: 130/84  Pulse: 77  Temp: 97.7 F (36.5 C)  TempSrc: Oral  Height: 5\' 2"  (1.575 m)  Weight: 269 lb (122.018 kg)  SpO2: 100%    Body mass index is 49.20 kg/(m^2).   Wt Readings from Last 2 Encounters:  04/11/12 269 lb (122.018 kg)  04/11/12 265 lb (120.203 kg)    General - no distress, using oxygen HEENT - no sinus tenderness, no oral exudate, no LAN Cardiac - s1s2 regular, no murmur Chest - prolonged exhalation, no wheezing, faint rales at left lung base Abd - obese, soft, non-tender Ext - no edema Neuro - normal strength Psych - normal mood behavior  Dg Chest 2 View  03/26/2012  *RADIOLOGY REPORT*  Clinical Data: Cough.  Fever.  Wheezing.  CHEST - 2 VIEW  Comparison: 02/28/2012  Findings: Airspace consolidation is present on the lateral view over the  lower thoracic spine, compatible with airspace disease and superior segment.  This is probably in the left lower lobe based on retrocardiac opacity on the frontal view.  There is no effusion. This is new compared to prior exam.  The cardiopericardial silhouette appears within normal limits.  Follow-up to ensure radiographic clearing recommended.  Clearing is usually observed at 8 weeks.  IMPRESSION: Superior segment left lower lobe pneumonia.   Original Report Authenticated By: Andreas Newport, M.D.    Ct Chest W Contrast  04/03/2012  *RADIOLOGY REPORT*  Clinical Data: Asthma.  Persistent pneumonia.  Rule out mass.  CT CHEST WITH CONTRAST  Technique:  Multidetector CT imaging of the chest was performed following the standard protocol during bolus administration of intravenous contrast.  Contrast: 80mL OMNIPAQUE IOHEXOL 300 MG/ML  SOLN  Comparison: chest radiograph on 03/26/2012 and chest CTA on 09/22/2011  Findings: There is persistent and mildly increased airspace disease in the superior segment of the left lower lobe which shows central air bronchograms.  This remains suspicious for pneumonia, with no evidence of central endobronchial obstruction or central hilar mass or lymphadenopathy identified.  Mild subsegmental atelectasis or scarring in the superior segment right lower lobe is increased, but there is no evidence of airspace disease.  No discrete pulmonary nodules or masses are identified.  No evidence of pleural or pericardial effusion.  No adenopathy seen elsewhere within the thorax.  Adrenal glands are normal in appearance.  IMPRESSION:  1.  Mild increase in air space disease in the superior segment left lower lobe.  This continues to show central air bronchograms, and no central endobronchial lesion or obstructing mass or lymphadenopathy is identified by CT.  Consider bronchoscopy vs. continued radiographic followup. 2.  Mild subsegmental atelectasis or scarring in the superior segment of the right  lower lobe. 3.  No mass or lymphadenopathy identified.   Original Report Authenticated By: Myles Rosenthal, M.D.     Lab Results  Component Value Date   WBC 19.2  03/31/2012   HGB 9.1 03/31/2012   HCT 29.6 03/31/2012   MCV 62.0 03/31/2012   PLT 601.0 03/31/2012   Lab Results  Component Value Date   CREATININE 0.9 03/31/2012   BUN 14 03/31/2012   NA 138 03/31/2012   K 3.8 03/31/2012   CL 99 03/31/2012   CO2 26 03/31/2012   Lab Results  Component Value Date   ALT 23 03/31/2012   AST 18 03/31/2012   ALKPHOS 79 03/31/2012   BILITOT 0.4 03/31/2012     Assessment/Plan:  [name], MD [facility] Pager:  [phone] 04/11/2012, 2:58 PM

## 2012-04-13 ENCOUNTER — Other Ambulatory Visit: Payer: Self-pay | Admitting: Pulmonary Disease

## 2012-04-15 ENCOUNTER — Ambulatory Visit
Admission: RE | Admit: 2012-04-15 | Discharge: 2012-04-15 | Disposition: A | Discharge status: Home / Self Care | Payer: Medicaid Other | Source: Ambulatory Visit | Attending: Family Medicine | Admitting: Family Medicine

## 2012-04-15 DIAGNOSIS — E237 Disorder of pituitary gland, unspecified: Secondary | ICD-10-CM

## 2012-04-15 MED ORDER — GADOBENATE DIMEGLUMINE 529 MG/ML IV SOLN
9.0000 mL | Freq: Once | INTRAVENOUS | Status: AC | PRN
  Administered 2012-04-15: 9 mL via INTRAVENOUS

## 2012-04-19 ENCOUNTER — Encounter: Payer: Self-pay | Admitting: Oncology

## 2012-04-19 ENCOUNTER — Telehealth: Payer: Self-pay | Admitting: Oncology

## 2012-04-19 NOTE — Telephone Encounter (Signed)
S/w pt re reassignment to [facility] and new appts for lb/fu. Per JG/LT appts moved 1-2 mths out. S/w pt today and pt upset that we keep changing her appts. Pt has had one past appt in November that was changed at [facility] request. The recent messages that pt received was in regards to calling us about reassignment and new appts which pt did not respond to. I explained this to patient and she understood more clearly why we trying to contact her. Patients states she has not been feeling good and she can tell her counts are off. Pt states she needs to be seen sooner because she knows she needs an infusion. Pt states she has lots of things going on..... She has cervical cancer and a brain tumor. I asked patient where did she get this information and was this why she was seeing LO. Pt stated no she had an mri and has seen other doctors about these issues. I left pt's lab appt for 1/8 and f/u for 2/10 (for now). Pt aware of both appts and that she will keep lb on 1/8 and f/u 2/10 (for now). Pt aware that this information would be sent to new provider and she would be contacted re 1/8 lb and a new f/u appt if she needs to be seen sooner. Mri and pathology report printed along with this note and the patient's schedule. This information will be given to JG/LT on Monday. Pt aware.

## 2012-04-21 ENCOUNTER — Encounter: Payer: Self-pay | Admitting: Oncology

## 2012-04-22 ENCOUNTER — Telehealth: Payer: Self-pay | Admitting: Internal Medicine

## 2012-04-22 NOTE — Assessment & Plan Note (Signed)
Will decrease prednisone to 15 mg per day.  She is to continue her other regimen with nebulizer and singulair.

## 2012-04-22 NOTE — Telephone Encounter (Signed)
Forward 3 pages [facility] to Dr. Oliver Barre for review on 04-22-12 ym

## 2012-04-22 NOTE — Assessment & Plan Note (Signed)
She has clinical improvement with antibiotic therapy.  Will give her additional course of levaquin.  She has infiltrate on superior segment LLL, but no obvious endobronchial lesions on CT chest.  Will defer bronchoscopy for now.  Will repeat CXR and next visit, and then determine if bronchoscopy is needed.

## 2012-04-22 NOTE — Assessment & Plan Note (Signed)
Likely related to obesity hypoventilation, and pneumonia.  She is to continue supplemental oxygen 24/7 for now.  Will re-assess her need for supplemental oxygen when she is in a chronic, stable condition. 

## 2012-04-22 NOTE — Assessment & Plan Note (Signed)
She is to to continue CPAP at night.       

## 2012-04-23 ENCOUNTER — Other Ambulatory Visit: Payer: Medicaid Other

## 2012-04-23 ENCOUNTER — Other Ambulatory Visit (HOSPITAL_BASED_OUTPATIENT_CLINIC_OR_DEPARTMENT_OTHER): Payer: Medicaid Other

## 2012-04-23 ENCOUNTER — Telehealth: Payer: Self-pay | Admitting: Hematology and Oncology

## 2012-04-23 ENCOUNTER — Telehealth: Payer: Self-pay | Admitting: *Deleted

## 2012-04-23 ENCOUNTER — Other Ambulatory Visit: Payer: Self-pay | Admitting: Oncology

## 2012-04-23 ENCOUNTER — Telehealth: Payer: Self-pay | Admitting: Family Medicine

## 2012-04-23 DIAGNOSIS — D649 Anemia, unspecified: Secondary | ICD-10-CM

## 2012-04-23 LAB — IRON AND TIBC
%SAT: 4 % — ABNORMAL LOW (ref 20–55)
Iron: 17 ug/dL — ABNORMAL LOW (ref 42–145)
TIBC: 418 ug/dL (ref 250–470)
UIBC: 401 ug/dL — ABNORMAL HIGH (ref 125–400)

## 2012-04-23 LAB — CBC WITH DIFFERENTIAL/PLATELET
BASO%: 0.4 % (ref 0.0–2.0)
Basophils Absolute: 0.1 10e3/uL (ref 0.0–0.1)
EOS%: 0.8 % (ref 0.0–7.0)
Eosinophils Absolute: 0.1 10e3/uL (ref 0.0–0.5)
HCT: 30.8 % — ABNORMAL LOW (ref 34.8–46.6)
HGB: 9.2 g/dL — ABNORMAL LOW (ref 11.6–15.9)
LYMPH%: 10.7 % — ABNORMAL LOW (ref 14.0–49.7)
MCH: 18.3 pg — ABNORMAL LOW (ref 25.1–34.0)
MCHC: 29.9 g/dL — ABNORMAL LOW (ref 31.5–36.0)
MCV: 61.2 fL — ABNORMAL LOW (ref 79.5–101.0)
MONO#: 0.4 10e3/uL (ref 0.1–0.9)
MONO%: 2.8 % (ref 0.0–14.0)
NEUT#: 12.9 10e3/uL — ABNORMAL HIGH (ref 1.5–6.5)
NEUT%: 85.3 % — ABNORMAL HIGH (ref 38.4–76.8)
Platelets: 487 10e3/uL — ABNORMAL HIGH (ref 145–400)
RBC: 5.03 10e6/uL (ref 3.70–5.45)
RDW: 18.8 % — ABNORMAL HIGH (ref 11.2–14.5)
WBC: 15.1 10e3/uL — ABNORMAL HIGH (ref 3.9–10.3)
lymph#: 1.6 10e3/uL (ref 0.9–3.3)

## 2012-04-23 LAB — FERRITIN: Ferritin: 17 ng/mL (ref 10–291)

## 2012-04-23 NOTE — Telephone Encounter (Signed)
Called pt to discuss results of colposcopy. CIN-1 following pap with ASCUS, high risk HPV. Patient informed that this is a low-grade lesion that will often resolve. Recommendation for repeat pap and HPV testing in 12 months. Explained that patient needs to have close follow up and not miss her pap smears. She would like to go back to Dr. Stefano Gaul as she was seeing him before pregnancy. [facility]. Pt advised to get records sent to Dr. Debria Garret office. Pt verbalized understanding of above. Napoleon Form, MD  04/23/2012 9:56 AM

## 2012-04-23 NOTE — Telephone Encounter (Signed)
Pt in for lab today & to see Lonna Cobb in Feb.  She states that she needs an iron infusion.  Per Dr Cyndie Chime, pt does not need an iron infusion at hgb 9.2 today.  Reported this to pt & also per Dr Cyndie Chime, she does not have cervical cancer or a brain tumor & her labs are stable c/w other values & he would like to start her on niferex 150mg  po bid.  She insists that she does have both cervical ca & a brain tumor & also needs an iron infusion & states that she has been on oral iron & it doesn't do any good.  She states she does not want to see Dr Cyndie Chime " because he doesn't care anything about her & asked to see someone else.  Discussed with Ann/Scheduler & she talked with Melissa/Scheduler & made new appt with Dr. Gaylyn Rong for 05/15/12.  I told her I would give all this information to Dr. Gaylyn Rong to make a decision her her iron infusion.  She is also on oxygen & when asked what she is on this for, she states she has severe asthma.  She has recently had a child who is with her today & she reports that she is having her period & knows her hgb is going to drop.  Message to Dr Gaylyn Rong.

## 2012-04-23 NOTE — Telephone Encounter (Signed)
Per [name] pt is not satisfied with Dr Reece Agar advice even though she has not seen him yet.  She req to be scheduled with another phy. And per Efraim Kaufmann we can try Dr Gaylyn Rong.  A new sch was made and printed for [name] to give to the pt     Cheyenne Arias

## 2012-04-24 ENCOUNTER — Other Ambulatory Visit: Payer: Self-pay | Admitting: Oncology

## 2012-04-24 ENCOUNTER — Telehealth: Payer: Self-pay | Admitting: Oncology

## 2012-04-24 ENCOUNTER — Encounter: Payer: Self-pay | Admitting: Oncology

## 2012-04-24 DIAGNOSIS — D649 Anemia, unspecified: Secondary | ICD-10-CM | POA: Diagnosis present

## 2012-04-24 DIAGNOSIS — D509 Iron deficiency anemia, unspecified: Secondary | ICD-10-CM

## 2012-04-24 NOTE — Telephone Encounter (Signed)
s.w. pt and advised on 1.15.14 appt....pt ok...ok for pt to have 30 min appt per Dr. Gaylyn Rong

## 2012-04-25 ENCOUNTER — Telehealth: Payer: Self-pay | Admitting: *Deleted

## 2012-04-25 ENCOUNTER — Ambulatory Visit: Payer: Medicaid Other | Admitting: Hematology and Oncology

## 2012-04-25 ENCOUNTER — Ambulatory Visit: Payer: Medicaid Other | Admitting: Nurse Practitioner

## 2012-04-25 NOTE — Telephone Encounter (Signed)
Per staff message and POF I have scheduled appts.  JMW  

## 2012-04-30 ENCOUNTER — Telehealth: Payer: Self-pay | Admitting: Oncology

## 2012-04-30 ENCOUNTER — Ambulatory Visit (HOSPITAL_BASED_OUTPATIENT_CLINIC_OR_DEPARTMENT_OTHER): Payer: Medicaid Other | Admitting: Oncology

## 2012-04-30 ENCOUNTER — Ambulatory Visit (HOSPITAL_BASED_OUTPATIENT_CLINIC_OR_DEPARTMENT_OTHER): Payer: Medicaid Other

## 2012-04-30 VITALS — BP 120/83 | HR 95 | Temp 98.3°F | Resp 20

## 2012-04-30 VITALS — BP 143/76 | HR 111 | Temp 97.8°F | Resp 22 | Ht 62.0 in | Wt 276.6 lb

## 2012-04-30 DIAGNOSIS — IMO0001 Reserved for inherently not codable concepts without codable children: Secondary | ICD-10-CM

## 2012-04-30 DIAGNOSIS — J45909 Unspecified asthma, uncomplicated: Secondary | ICD-10-CM

## 2012-04-30 DIAGNOSIS — D509 Iron deficiency anemia, unspecified: Secondary | ICD-10-CM

## 2012-04-30 DIAGNOSIS — N920 Excessive and frequent menstruation with regular cycle: Secondary | ICD-10-CM

## 2012-04-30 MED ORDER — FERUMOXYTOL INJECTION 510 MG/17 ML
1020.0000 mg | Freq: Once | INTRAVENOUS | Status: AC
  Administered 2012-04-30: 1020 mg via INTRAVENOUS
  Filled 2012-04-30: qty 34

## 2012-04-30 MED ORDER — DIPHENHYDRAMINE HCL 25 MG PO TABS
25.0000 mg | ORAL_TABLET | Freq: Once | ORAL | Status: AC
Start: 2012-04-30 — End: 2012-04-30
  Administered 2012-04-30: 25 mg via ORAL
  Filled 2012-04-30: qty 1

## 2012-04-30 MED ORDER — ACETAMINOPHEN 325 MG PO TABS
650.0000 mg | ORAL_TABLET | Freq: Once | ORAL | Status: AC
  Administered 2012-04-30: 650 mg via ORAL

## 2012-04-30 NOTE — Patient Instructions (Addendum)
Ferumoxytol injection What is this medicine? FERUMOXYTOL is an iron complex. Iron is used to make healthy red blood cells, which carry oxygen and nutrients throughout the body. This medicine is used to treat iron deficiency anemia in people with chronic kidney disease. This medicine may be used for other purposes; ask your health care provider or pharmacist if you have questions. What should I tell my health care provider before I take this medicine? They need to know if you have any of these conditions: -anemia not caused by low iron levels -high levels of iron in the blood -magnetic resonance imaging (MRI) test scheduled -an unusual or allergic reaction to iron, other medicines, foods, dyes, or preservatives -pregnant or trying to get pregnant -breast-feeding How should I use this medicine? This medicine is for infusion into a vein. It is given by a health care professional in a hospital or clinic setting. Talk to your pediatrician regarding the use of this medicine in children. Special care may be needed. Overdosage: If you think you've taken too much of this medicine contact a poison control center or emergency room at once. Overdosage: If you think you have taken too much of this medicine contact a poison control center or emergency room at once. NOTE: This medicine is only for you. Do not share this medicine with others. What if I miss a dose? It is important not to miss your dose. Call your doctor or health care professional if you are unable to keep an appointment. What may interact with this medicine? This medicine may interact with the following medications: -other iron products This list may not describe all possible interactions. Give your health care provider a list of all the medicines, herbs, non-prescription drugs, or dietary supplements you use. Also tell them if you smoke, drink alcohol, or use illegal drugs. Some items may interact with your medicine. What should I watch  for while using this medicine? Visit your doctor or healthcare professional regularly. Tell your doctor or healthcare professional if your symptoms do not start to get better or if they get worse. You may need blood work done while you are taking this medicine. You may need to follow a special diet. Talk to your doctor. Foods that contain iron include: whole grains/cereals, dried fruits, beans, or peas, leafy green vegetables, and organ meats (liver, kidney). What side effects may I notice from receiving this medicine? Side effects that you should report to your doctor or health care professional as soon as possible: -allergic reactions like skin rash, itching or hives, swelling of the face, lips, or tongue -breathing problems -changes in blood pressure -feeling faint or lightheaded, falls -fever or chills -flushing, sweating, or hot feelings -swelling of the ankles or feet Side effects that usually do not require medical attention (Report these to your doctor or health care professional if they continue or are bothersome.): -diarrhea -headache -nausea, vomiting -stomach pain This list may not describe all possible side effects. Call your doctor for medical advice about side effects. You may report side effects to FDA at 1-800-FDA-1088. Where should I keep my medicine? This drug is given in a hospital or clinic and will not be stored at home. NOTE: This sheet is a summary. It may not cover all possible information. If you have questions about this medicine, talk to your doctor, pharmacist, or health care provider.  2012, [employer]. (12/24/2007 9:48:25 PM) 

## 2012-04-30 NOTE — Telephone Encounter (Signed)
gv and printed appt schedule for pt for March, May, July, Aug...the patient aware

## 2012-04-30 NOTE — Patient Instructions (Addendum)
1.  Iron deficiency anemia:  IV iron.  Keep taking oral iron as tolerated.  Continue to take oral iron as tolerable.  2.  Positive PAP smear:  Follow up with Gynecologist. 3.  Follow up:  Lab only of every 2 months. Return visit in about 8 months.

## 2012-04-30 NOTE — Progress Notes (Signed)
[facility]  [phone] [phone]   OFFICE PROGRESS NOTE   Cc:  Kaleen Mask, MD  DIAGNOSIS: iron deficiency anemia due to menorrhagia.   CURRENT THERAPY:  IV iron as needed; cannot tolerate oral iron.   INTERVAL HISTORY: Cheyenne Arias 37 y.o. female returns for regular follow up with her mother.  She used to follow up with Dr. Dalene Carrow who had left the practice.  I had a chance to review patient's medical record from [facility].  She reported that for the last few months, she is been having more fatigue, distant exertion, shortness of breath, dizziness.  Her menstrual flow is quite heavy requiring her to change every hour or 2.  She is been taking oral iron elixir. She could not tolerate iron pill in the past due to dyspepsia, constipation. She could not tolerate oral birth control pill. She has a discussed with her gynecologist about other measures to control metrorrhagia.  She is chronic diffuse pain from fibromyalgia. She just chronic shortness of breath due to asthma and she is on inhalers in addition to home oxygen.  She is positive Pap smear and is under the care of Gynecologist for this.  She denies any other visible source of bleeding such as gum bleed, hemoptysis, hematemesis, abdominal pain, melena, hematochezia, hematuria. The rest of the 14 point review of system was negative.  She reported intermittent bilateral cervical adenopathy which resolves with or antibiotic. Currently, there is no adenopathy.  Past Medical History  Diagnosis Date  . Hypertension   . Migraine   . Anxiety   . Abnormal Pap smear   . Anemia   . Fibroids   . Fibromyalgia     degenerative disc disease  . Depression   . Sleep apnea   . Shortness of breath   . On home oxygen therapy     2 liters [place]  . PONV (postoperative nausea and vomiting)   . Low iron     pt states low iron-for Feraheme today in [facility]  . Morbid obesity with BMI of 45.0-49.9, adult   .  Asthma     recent admission with exacerbation of asthma 09/15/11  . Iron deficiency anemia 04/24/2012    Past Surgical History  Procedure Date  . Wisdom tooth extraction   . Mandible surgery 2008  . Carpal tunnel release   . Cesarean section     1994, 1996   . Cesarean section 11/28/2011    Procedure: CESAREAN SECTION;  Surgeon: Adam Phenix, MD;  Location: [facility];  Service: Obstetrics;  Laterality: N/A;    Current Outpatient Prescriptions  Medication Sig Dispense Refill  . albuterol (PROVENTIL HFA;VENTOLIN HFA) 108 (90 BASE) MCG/ACT inhaler Inhale 2 puffs into the lungs every 6 (six) hours as needed. for asthma/shortness of breath  1 Inhaler  5  . amLODipine (NORVASC) 10 MG tablet Take 1 tablet (10 mg total) by mouth daily.  31 tablet  6  . budesonide (PULMICORT) 0.5 MG/2ML nebulizer solution 1 vial twice a day Dx 493.90  120 mL  3  . clonazePAM (KLONOPIN) 0.5 MG tablet Take 0.5 mg by mouth 2 (two) times daily as needed.      . cyclobenzaprine (FLEXERIL) 10 MG tablet Take 10 mg by mouth 3 (three) times daily as needed. For muscle spasms      . ferrous sulfate 325 (65 FE) MG tablet Take 1 tablet (325 mg total) by mouth daily with breakfast.  30 tablet  1  . fluconazole (DIFLUCAN) 150 MG tablet Take 1 tablet (150 mg total) by mouth once. Take only one tablet today.  If symptoms persist, get second tablet and take in 1 week.  1 tablet  1  . FLUoxetine (PROZAC) 20 MG tablet Take 20 mg by mouth daily.      . fluticasone (FLONASE) 50 MCG/ACT nasal spray Place 2 sprays into the nose 2 (two) times daily.  16 g  12  . furosemide (LASIX) 40 MG tablet Take 1 tablet (40 mg total) by mouth daily.  10 tablet  1  . ibuprofen (ADVIL,MOTRIN) 800 MG tablet Take 1 tablet (800 mg total) by mouth 3 (three) times daily.  21 tablet  0  . ipratropium-albuterol (DUONEB) 0.5-2.5 (3) MG/3ML SOLN Take 3 mLs by nebulization every 4 (four) hours as needed. For asthma symptoms      . levofloxacin (LEVAQUIN) 750 MG  tablet Take 1 tablet (750 mg total) by mouth daily.  5 tablet  0  . levofloxacin (LEVAQUIN) 750 MG tablet Take 1 tablet (750 mg total) by mouth daily.  7 tablet  0  . loratadine (CLARITIN) 10 MG tablet Take 1 tablet (10 mg total) by mouth daily.  31 tablet  12  . metoprolol tartrate (LOPRESSOR) 25 MG tablet Take 1 tablet (25 mg total) by mouth 2 (two) times daily.  360 tablet  6  . montelukast (SINGULAIR) 10 MG tablet Take 1 tablet (10 mg total) by mouth at bedtime.  30 tablet  5  . nystatin cream (MYCOSTATIN) Apply to affected area 2 times daily  15 g  0  . oxyCODONE-acetaminophen (PERCOCET/ROXICET) 5-325 MG per tablet Take 2 tablets by mouth every 4 (four) hours as needed for pain.  15 tablet  0  . potassium chloride SA (K-DUR,KLOR-CON) 20 MEQ tablet Take 1 tablet (20 mEq total) by mouth daily.  10 tablet  1  . predniSONE (DELTASONE) 10 MG tablet Take 1.5 tablets (15 mg total) by mouth daily.  60 tablet  0  . pregabalin (LYRICA) 75 MG capsule Take 75 mg by mouth 2 (two) times daily.      . Prenatal Vit-Fe Fumarate-FA (PRENATAL MULTIVITAMIN) TABS Take 1 tablet by mouth every morning.      [name] PULMICORT 0.5 MG/2ML nebulizer solution TAKE 1 VIAL (0.5 MG TOTAL) BY NEBULIZATION 2 (TWO) TIMES DAILY.  60 mL  3  . rizatriptan (MAXALT) 10 MG tablet Take 10 mg by mouth as needed. May repeat in 2 hours if needed      . senna-docusate (SENOKOT-S) 8.6-50 MG per tablet Take 2 tablets by mouth at bedtime.  30 tablet  0  . Topiramate (TOPAMAX PO) Take by mouth as needed.      . zolpidem (AMBIEN) 5 MG tablet Take 5 mg by mouth at bedtime as needed.        ALLERGIES:  is allergic to latex and morphine and related.  REVIEW OF SYSTEMS:  The rest of the 14-point review of system was negative.   Filed Vitals:   04/30/12 0954  BP: 143/76  Pulse: 111  Temp: 97.8 F (36.6 C)  Resp: 22   Wt Readings from Last 3 Encounters:  04/30/12 276 lb 9.6 oz (125.465 kg)  04/11/12 269 lb (122.018 kg)  04/11/12 265 lb  (120.203 kg)   ECOG Performance status:   PHYSICAL EXAMINATION:   General:  Obese woman, in no acute distress on oxygen nasal canula.  Eyes:  no scleral icterus.  ENT:  There were no oropharyngeal lesions.  Neck was without thyromegaly.  Lymphatics:  Negative cervical, supraclavicular or axillary adenopathy.  Respiratory: lungs were clear bilaterally without wheezing or crackles.  Cardiovascular:  Regular rate and rhythm, S1/S2, without murmur, rub or gallop.  There was no pedal edema.  GI:  abdomen was soft, flat, nontender, nondistended, without organomegaly.  Muscoloskeletal:  no spinal tenderness of palpation of vertebral spine.  Skin exam was without echymosis, petichae.  Neuro exam was nonfocal.  Patient was able to get on and off exam table without assistance.  Gait was normal.  Patient was alerted and oriented.  Attention was good.   Language was appropriate.  Mood was normal without depression.  Speech was not pressured.  Thought content was not tangential.     LABORATORY/RADIOLOGY DATA:  Lab Results  Component Value Date   WBC 15.1* 04/23/2012   HGB 9.2* 04/23/2012   HCT 30.8* 04/23/2012   PLT 487* 04/23/2012   GLUCOSE 112* 03/31/2012   ALKPHOS 79 03/31/2012   ALT 23 03/31/2012   AST 18 03/31/2012   NA 138 03/31/2012   K 3.8 03/31/2012   CL 99 03/31/2012   CREATININE 0.9 03/31/2012   BUN 14 03/31/2012   CO2 26 03/31/2012    ASSESSMENT AND PLAN:   1.  menometrorrhagia: She is able to tolerate oral birth-control pills. However I advised her that there are more than just  birth-control pills to control her dysmenorrhagia. She was again hysterectomy. However endometrial ablation is an option. I defer this to gynecologist for advice.  2.  positive Pap smear: Followup with her gynecologist with yearly Pap smear.  3.  iron deficiency anemia due to dysmenorrhagia: I recommend that she receives feraheme today given her anemia and iron deficiency. She has been taking oral iron elixir. I  advised her to continue this. Compared to older formations off IV iron, ferritin has lower chance of infusion reaction and is quite effective. She is very afraid of infusion reaction. I added on Tylenol and Benadryl premedication before feraheme today.  Her hemoglobin is 9 and she does not require any blood transfusion this time.  4.  fibromyalgia: She is on antidepressant Prozac per her psychiatrist.  5.  asthma: She is on home O2 additions to inhalers. She is also on oral prednisone taper which can account for her leukocytosis. There is no focal symptoms to suggest acute infection at this time  6.  hypertension: She is on amlodipine, metoprolol per PCP.  7.  followup: In about one year. Lab tests with CBC and ferritin once every 3 months.  The length of time of the face-to-face encounter was 25 minutes. More than 50% of time was spent counseling and coordination of care.

## 2012-05-02 ENCOUNTER — Ambulatory Visit: Payer: Medicaid Other | Admitting: Medical

## 2012-05-05 ENCOUNTER — Telehealth: Payer: Self-pay | Admitting: Pulmonary Disease

## 2012-05-05 MED ORDER — AZITHROMYCIN 250 MG PO TABS: ORAL_TABLET | ORAL | Status: DC

## 2012-05-05 NOTE — Telephone Encounter (Signed)
That is fine Zpack #1 -take as directed.  However has been on several abx  Needs to eat yogurt Keep follow up with Dr. Craige Cotta  W/ follow up cxr  If not improivng will need sooner ov.  Please contact office for sooner follow up if symptoms do not improve or worsen or seek emergency care

## 2012-05-05 NOTE — Telephone Encounter (Signed)
I spoke with pt. C/o of facial pressure, bilateral ear pain, watery itchy eyes, nasal congestion, PND, blowing out yellow-green phlem x 1 week. Taking Claritin and using her flonase. Pt is requesting ZPAK. Since VS is scheduled off will forward to TP please advise thanks Pending appt 05/08/12 w/ VS Allergies  Allergen Reactions  . Latex Itching and Swelling    Blisters  . Morphine And Related Itching

## 2012-05-05 NOTE — Telephone Encounter (Signed)
Pt is aware of TP recs. She voiced her understanding. RX has been sent to the pharmacy. Will forward to VS as an [place]

## 2012-05-08 ENCOUNTER — Ambulatory Visit: Payer: Medicaid Other | Admitting: Pulmonary Disease

## 2012-05-09 ENCOUNTER — Encounter: Payer: Self-pay | Admitting: Adult Health

## 2012-05-09 ENCOUNTER — Emergency Department (HOSPITAL_BASED_OUTPATIENT_CLINIC_OR_DEPARTMENT_OTHER)
Admission: EM | Admit: 2012-05-09 | Discharge: 2012-05-09 | Disposition: A | Discharge status: Home / Self Care | Payer: Medicaid Other | Attending: Emergency Medicine | Admitting: Emergency Medicine

## 2012-05-09 ENCOUNTER — Encounter (HOSPITAL_BASED_OUTPATIENT_CLINIC_OR_DEPARTMENT_OTHER): Payer: Self-pay | Admitting: *Deleted

## 2012-05-09 ENCOUNTER — Emergency Department (HOSPITAL_BASED_OUTPATIENT_CLINIC_OR_DEPARTMENT_OTHER): Payer: Medicaid Other

## 2012-05-09 ENCOUNTER — Ambulatory Visit (INDEPENDENT_AMBULATORY_CARE_PROVIDER_SITE_OTHER): Payer: Medicaid Other | Admitting: Adult Health

## 2012-05-09 VITALS — BP 120/80 | Temp 98.2°F | Ht 62.0 in | Wt 281.2 lb

## 2012-05-09 DIAGNOSIS — I10 Essential (primary) hypertension: Secondary | ICD-10-CM | POA: Insufficient documentation

## 2012-05-09 DIAGNOSIS — F329 Major depressive disorder, single episode, unspecified: Secondary | ICD-10-CM | POA: Insufficient documentation

## 2012-05-09 DIAGNOSIS — Z8742 Personal history of other diseases of the female genital tract: Secondary | ICD-10-CM | POA: Insufficient documentation

## 2012-05-09 DIAGNOSIS — F3289 Other specified depressive episodes: Secondary | ICD-10-CM | POA: Insufficient documentation

## 2012-05-09 DIAGNOSIS — F411 Generalized anxiety disorder: Secondary | ICD-10-CM | POA: Insufficient documentation

## 2012-05-09 DIAGNOSIS — Z79899 Other long term (current) drug therapy: Secondary | ICD-10-CM | POA: Insufficient documentation

## 2012-05-09 DIAGNOSIS — J159 Unspecified bacterial pneumonia: Secondary | ICD-10-CM | POA: Insufficient documentation

## 2012-05-09 DIAGNOSIS — R062 Wheezing: Secondary | ICD-10-CM | POA: Insufficient documentation

## 2012-05-09 DIAGNOSIS — J3489 Other specified disorders of nose and nasal sinuses: Secondary | ICD-10-CM | POA: Insufficient documentation

## 2012-05-09 DIAGNOSIS — J189 Pneumonia, unspecified organism: Secondary | ICD-10-CM

## 2012-05-09 DIAGNOSIS — J45909 Unspecified asthma, uncomplicated: Secondary | ICD-10-CM | POA: Insufficient documentation

## 2012-05-09 DIAGNOSIS — Z8679 Personal history of other diseases of the circulatory system: Secondary | ICD-10-CM | POA: Insufficient documentation

## 2012-05-09 DIAGNOSIS — R0602 Shortness of breath: Secondary | ICD-10-CM

## 2012-05-09 DIAGNOSIS — R599 Enlarged lymph nodes, unspecified: Secondary | ICD-10-CM | POA: Insufficient documentation

## 2012-05-09 DIAGNOSIS — R0789 Other chest pain: Secondary | ICD-10-CM | POA: Insufficient documentation

## 2012-05-09 DIAGNOSIS — Z862 Personal history of diseases of the blood and blood-forming organs and certain disorders involving the immune mechanism: Secondary | ICD-10-CM | POA: Insufficient documentation

## 2012-05-09 DIAGNOSIS — G4733 Obstructive sleep apnea (adult) (pediatric): Secondary | ICD-10-CM

## 2012-05-09 MED ORDER — LEVOFLOXACIN 500 MG PO TABS
500.0000 mg | ORAL_TABLET | Freq: Once | ORAL | Status: AC
  Administered 2012-05-09: 500 mg via ORAL
  Filled 2012-05-09: qty 1

## 2012-05-09 MED ORDER — LEVOFLOXACIN 500 MG PO TABS: 500.0000 mg | ORAL_TABLET | Freq: Every day | ORAL | Status: DC

## 2012-05-09 MED ORDER — PREDNISONE 50 MG PO TABS
60.0000 mg | ORAL_TABLET | Freq: Once | ORAL | Status: AC
  Administered 2012-05-09: 60 mg via ORAL
  Filled 2012-05-09: qty 1

## 2012-05-09 MED ORDER — IPRATROPIUM BROMIDE 0.02 % IN SOLN
0.5000 mg | Freq: Once | RESPIRATORY_TRACT | Status: AC
  Administered 2012-05-09: 0.5 mg via RESPIRATORY_TRACT
  Filled 2012-05-09: qty 2.5

## 2012-05-09 MED ORDER — FLUCONAZOLE 150 MG PO TABS: 150.0000 mg | ORAL_TABLET | Freq: Once | ORAL | Status: DC

## 2012-05-09 MED ORDER — ALBUTEROL SULFATE (5 MG/ML) 0.5% IN NEBU
5.0000 mg | INHALATION_SOLUTION | Freq: Once | RESPIRATORY_TRACT | Status: AC
  Administered 2012-05-09: 5 mg via RESPIRATORY_TRACT
  Filled 2012-05-09: qty 1

## 2012-05-09 MED ORDER — PREDNISONE 10 MG PO TABS: 60.0000 mg | ORAL_TABLET | Freq: Every day | ORAL | Status: DC

## 2012-05-09 NOTE — ED Provider Notes (Signed)
Medical screening examination/treatment/procedure(s) were conducted as a shared visit with non-physician practitioner(s) and myself.  I personally evaluated the patient during the encounter  Please see my separate respective documentation pertaining to this patient encounter   Vida Roller, MD 05/09/12 [phone]

## 2012-05-09 NOTE — ED Provider Notes (Signed)
History     CSN: [id number]  Arrival date & time 05/09/12  0320   First MD Initiated Contact with Patient 05/09/12 [phone]      Chief Complaint  Patient presents with  . Shortness of Breath    (Consider location/radiation/quality/duration/timing/severity/associated sxs/prior treatment) HPI Comments: Patient is morbidly obese who presents with shortness of breath that woke her from her sleep. She states that she has associated pain at the base of her R lung field that she describes as "rocks rattling". Is followed by pulmonologist for asthma and is on 2L oxygen when at home. Pulmonologist prescribed 5 day course of azithromycin for symptoms consistent with sinus infection that patient finished two days ago. She states she still has occasional "greenish" discharge from her nose.  Patient is a 37 y.o. female presenting with shortness of breath. The history is provided by the patient. No language interpreter was used.  Shortness of Breath  The current episode started 5 to 7 days ago. The onset was gradual (over 1-2 days). The problem occurs continuously. The problem has been unchanged (Patient states she continuously has difficulty breathing). The problem is moderate. Relieved by: Symptoms not relieved with asthma medications; not worsened with exertion. Associated symptoms include rhinorrhea, shortness of breath and wheezing. Pertinent negatives include no chest pain, no fever, no sore throat and no cough. The rhinorrhea has been occurring intermittently. Rhinorrhea appearance: at times green in appearance. There was no intake of a foreign body. She was not exposed to toxic fumes. She is currently using steroids (Pt is taking 15mg  prednisone QD). Her past medical history is significant for asthma. She has been behaving normally. There were sick contacts at home. Recently, medical care has been given by a specialist (Pt consulted with pulmonologist, Dr. Craige Cotta, regarding symptoms; was put on 5 day course  of azithromycin; finished 2 days ago).    Past Medical History  Diagnosis Date  . Hypertension   . Migraine   . Anxiety   . Abnormal Pap smear   . Anemia   . Fibroids   . Fibromyalgia     degenerative disc disease  . Depression   . Sleep apnea   . Shortness of breath   . On home oxygen therapy     2 liters [place]  . PONV (postoperative nausea and vomiting)   . Low iron     pt states low iron-for Feraheme today in [facility]  . Morbid obesity with BMI of 45.0-49.9, adult   . Asthma     recent admission with exacerbation of asthma 09/15/11  . Iron deficiency anemia 04/24/2012    Past Surgical History  Procedure Date  . Wisdom tooth extraction   . Mandible surgery 2008  . Carpal tunnel release   . Cesarean section     1994, 1996   . Cesarean section 11/28/2011    Procedure: CESAREAN SECTION;  Surgeon: Adam Phenix, MD;  Location: [facility];  Service: Obstetrics;  Laterality: N/A;    Family History  Problem Relation Age of Onset  . Sickle cell trait Maternal Aunt   . Sickle cell anemia Other     History  Substance Use Topics  . Smoking status: Never Smoker   . Smokeless tobacco: Never Used     Comment: Pt was exposed to 2nd hand smoke at work years ago.  . Alcohol Use: No    OB History    Grav Para Term Preterm Abortions TAB SAB Ect Mult Living   3 3  3       3      Review of Systems  Constitutional: Negative for fever and diaphoresis.  HENT: Positive for congestion and rhinorrhea. Negative for sore throat.        + Sinus pressure/tenderness; + lymphadenopathy  Respiratory: Positive for chest tightness, shortness of breath and wheezing. Negative for cough.   Cardiovascular: Negative for chest pain.  Gastrointestinal: Negative for nausea, vomiting, abdominal pain and diarrhea.  Genitourinary: Negative for dysuria and hematuria.  Neurological: Negative for dizziness and light-headedness.    Allergies  Latex and Morphine and related  Home Medications   Current  Outpatient Rx  Name  Route  Sig  Dispense  Refill  . ALBUTEROL SULFATE HFA 108 (90 BASE) MCG/ACT IN AERS   Inhalation   Inhale 2 puffs into the lungs every 6 (six) hours as needed. for asthma/shortness of breath   1 Inhaler   5   . AMLODIPINE BESYLATE 10 MG PO TABS   Oral   Take 1 tablet (10 mg total) by mouth daily.   31 tablet   6   . AZITHROMYCIN 250 MG PO TABS      Take as directed   6 tablet   0   . BUDESONIDE 0.5 MG/2ML IN SUSP      1 vial twice a day Dx 493.90   120 mL   3   . CLONAZEPAM 0.5 MG PO TABS   Oral   Take 0.5 mg by mouth 2 (two) times daily as needed.         . CYCLOBENZAPRINE HCL 10 MG PO TABS   Oral   Take 10 mg by mouth 3 (three) times daily as needed. For muscle spasms         . FERROUS SULFATE 325 (65 FE) MG PO TABS   Oral   Take 1 tablet (325 mg total) by mouth daily with breakfast.   30 tablet   1   . FLUCONAZOLE 150 MG PO TABS   Oral   Take 1 tablet (150 mg total) by mouth once. Take only one tablet today.  If symptoms persist, get second tablet and take in 1 week.   1 tablet   1   . FLUCONAZOLE 150 MG PO TABS   Oral   Take 1 tablet (150 mg total) by mouth once.   1 tablet   0   . FLUOXETINE HCL 20 MG PO TABS   Oral   Take 20 mg by mouth daily.         [name] FLUTICASONE PROPIONATE 50 MCG/ACT NA SUSP   Nasal   Place 2 sprays into the nose 2 (two) times daily.   16 g   12   . FUROSEMIDE 40 MG PO TABS   Oral   Take 1 tablet (40 mg total) by mouth daily.   10 tablet   1   . IBUPROFEN 800 MG PO TABS   Oral   Take 1 tablet (800 mg total) by mouth 3 (three) times daily.   21 tablet   0   . IPRATROPIUM-ALBUTEROL 0.5-2.5 (3) MG/3ML IN SOLN   Nebulization   Take 3 mLs by nebulization every 4 (four) hours as needed. For asthma symptoms         . LEVOFLOXACIN 500 MG PO TABS   Oral   Take 1 tablet (500 mg total) by mouth daily.   7 tablet   0   . LEVOFLOXACIN 750 MG PO  TABS   Oral   Take 1 tablet (750 mg total) by  mouth daily.   5 tablet   0   . LEVOFLOXACIN 750 MG PO TABS   Oral   Take 1 tablet (750 mg total) by mouth daily.   7 tablet   0   . LORATADINE 10 MG PO TABS   Oral   Take 1 tablet (10 mg total) by mouth daily.   31 tablet   12   . METOPROLOL TARTRATE 25 MG PO TABS   Oral   Take 1 tablet (25 mg total) by mouth 2 (two) times daily.   360 tablet   6   . MONTELUKAST SODIUM 10 MG PO TABS   Oral   Take 1 tablet (10 mg total) by mouth at bedtime.   30 tablet   5   . NYSTATIN 100000 UNIT/GM EX CREA      Apply to affected area 2 times daily   15 g   0   . OXYCODONE-ACETAMINOPHEN 5-325 MG PO TABS   Oral   Take 2 tablets by mouth every 4 (four) hours as needed for pain.   15 tablet   0   . POTASSIUM CHLORIDE CRYS ER 20 MEQ PO TBCR   Oral   Take 1 tablet (20 mEq total) by mouth daily.   10 tablet   1   . PREDNISONE 10 MG PO TABS   Oral   Take 1.5 tablets (15 mg total) by mouth daily.   60 tablet   0   . PREDNISONE 10 MG PO TABS   Oral   Take 6 tablets (60 mg total) by mouth daily.   30 tablet   0   . PREGABALIN 75 MG PO CAPS   Oral   Take 75 mg by mouth 2 (two) times daily.         [name] PRENATAL MULTIVITAMIN CH   Oral   Take 1 tablet by mouth every morning.         [name] PULMICORT 0.5 MG/2ML IN SUSP      TAKE 1 VIAL (0.5 MG TOTAL) BY NEBULIZATION 2 (TWO) TIMES DAILY.   60 mL   3   . RIZATRIPTAN BENZOATE 10 MG PO TABS   Oral   Take 10 mg by mouth as needed. May repeat in 2 hours if needed         . SENNOSIDES-DOCUSATE SODIUM 8.6-50 MG PO TABS   Oral   Take 2 tablets by mouth at bedtime.   30 tablet   0   . TOPAMAX PO   Oral   Take by mouth as needed.         [name] ZOLPIDEM TARTRATE 5 MG PO TABS   Oral   Take 5 mg by mouth at bedtime as needed.           BP 153/85  Pulse 102  Resp 22  SpO2 100%  LMP 04/27/2012  Physical Exam  Constitutional:       Morbidly obese  HENT:  Head: Normocephalic and atraumatic.  Eyes: Pupils are  equal, round, and reactive to light. No scleral icterus.  Cardiovascular: Normal rate and regular rhythm.   Pulmonary/Chest: No respiratory distress. She has wheezes. She has no rales.  Abdominal: Soft. There is no tenderness. There is no guarding.  Musculoskeletal: Normal range of motion.  Lymphadenopathy:    She has cervical adenopathy.  Neurological: She is alert.  Skin: Skin is warm and  dry.  Psychiatric: She has a normal mood and affect. Her behavior is normal.    ED Course  Procedures (including critical care time)  Labs Reviewed - No data to display Dg Chest 2 View  05/09/2012  *RADIOLOGY REPORT*  Clinical Data: Shortness of breath, cough and dyspnea.  History of asthma.  CHEST - 2 VIEW  Comparison: Chest radiograph performed 03/26/2012, and CT of the chest performed 04/03/2012  Findings: The lungs are well-aerated; minimal peripheral right midlung opacity may reflect mild pneumonia.  There is no evidence of pleural effusion or pneumothorax.  The heart is normal in size; the mediastinal contour is within normal limits.  No acute osseous abnormalities are seen.  IMPRESSION: Minimal peripheral right midlung opacity may reflect mild pneumonia.   Original Report Authenticated By: Tonia Ghent, M.D.      1. Community acquired pneumonia   2. Shortness of breath       MDM  Patient is 36y/o female with extensive asthma history followed by Dr. Craige Cotta in pulmonology who presents c/o worsening SOB x 3 days. Patient is on 2L O2 at home and numerous asthma medications including 15mg  prednisone PO QD which are providing her no relief. Patient also recently finished a 5 day course of azithromycin prescribed by Dr. Craige Cotta for symptoms consisted with a sinus infection. On exam patient has diffuse wheezing, more so in the RLL. Will obtain CXR to determine if infectious etiology. Will also give albuterol/atrovent neb treatment and prednisone to try and improve symptoms. Since patient is still having  symptoms consistent with sinus infection and pain around the base of her right lung, will treat with 500mg  PO of Levaquin.  CXR showed signs of ? Mild pneumonia. Will continue to treat patient with week long course of Levaquin. After breathing tx patient states she is feeling better. O2 sat on 2L is 100% and patient uses 2L O2 when at home. Will d/c with script for 60mg  prednisone x 5 days and have her follow up with her pulmonologist. Patient states she has an appt with him today. Have also written script for diflucan as patient states she frequently gets yeast infections after a course of Abx. Instructed to return to the ED if symptoms worsen.    Antony Madura, PA-C 05/09/12 [phone]

## 2012-05-09 NOTE — ED Notes (Signed)
Pt sts she is chronically short of breath that has gotten worse over the last 3 days. She sts she has some pain with inspiration on her right side.

## 2012-05-09 NOTE — Progress Notes (Signed)
Subjective:    Patient ID: Cheyenne Arias, female    DOB: September 26, 1975, 37 y.o.   MRN: [id number] HPI 37  yo G3 P2 female seen for initial pulmonary consult during hospitalization 09/19/11 for asthma flare during pregnancy .  She has history of mild, intermittent asthma.  She has hypertension. She has a history of mild sleep apnea (PSG 03/27/11>>AHI 10.9), and is on CPAP 10 cm H2O.  10/30/2011 Admitted 6/1-6/14 /13 for asthma exacerbation during pregnancy-c/b sinusitis, OSA  Tx w aggressive pulmonary hygiene , abx and steroids along with CPAP. Started on oxygen due to desaturations noted by Ob service. Ct angio, duplex neg, echo -nml LV fn Seen on 10/09/2011 for post hospital visit  Developed an acne like rash along chest , back and upper arms , went to ER , rx short steroid taper on 6/23. cpap - was on 10 now On auto 5-15  c section planned for 8/5 /13  Acute visit today for  c/o chest congestion, cough w/ clumps of green phlem, increase in SOB chest heaviness that last all day x 3 days. she has been using the pulmicort q12hrs and atrovent every 4 hours. Since discharge feels better but still gets winded easily.  Some wheezing on /off.  Wears O2 continuously.  Wearing CPAP every night.  Does have some congestion-clear.  Finished prednisone .    She is doing well with CPAP.  She remains on oxygen 24/7.  Tests: PSG 03/27/11>>AHI 10.9 Echo 09/20/11>>mod LVH, EF 65 to 70%, small/mod pericardial effusion  Doppler legs 09/21/11>>no DVT CT chest 09/22/11>>no PE, Lt base ATX/ASD, borderline cardiomegaly Spirometry 01/10/12>>FEV1 1.72 (71%), FEV1% 77  03/27/2012 ER follow up  Pt walked in today to clinic to be seen . She was seen in ER 03/26/12 for increased cough /congestion w/ blood tinged mucus. She was dx with LLL CAP on cxr. . Tx w/ Levaquin 750mg  daily x 5 days  Complains that over 3 days she has had fever, cough with thick mucus plugs and blood tinged mucus.   She is concerned that she is  still coughing up blood tinged mucus. Very upset that she continues to have recurrent cough with productive large mucus plug that are very large.  She brought in a mucus plug soaked in water contained in a  baby food jar . It was plug with no obvious blood or clots noted.     No frank hemoptysis . No fever or edema  Painful cough in ribs at times, esp on left.  Mother and patient demand that she have a Bronchoscopy today, "this was recommended by the doctor in ER" . " I need to be flushed out"  I attempted to explain what a  Bronchoscopy is and how the procedure works . As well as PNA and typical course and treatment . However they were upset that a Bronch was not going to be done immediately. Used several references that they were not being provided with adequate care despite attempts to explain her condition.  They then demanded they be given " samples now because I do not have money"   Seen 3 weeks ago in office CT sinus was neg for sinusitis    IGE level was 15 . She has follow up with Dr. Craige Cotta  In 4 days .    05/09/2012 ER follow up  Pt returns for 3 week follow up PNA - went to ED last night for tightness in bottom right lung, rattling/wheezing, dyspnea, chest congestion w/  cough, head congestion w/ greem drainage x3days.  was given levaquin and prednisone 60mg .     Review of Systems Constitutional:   No  weight loss, night sweats,  + Fevers, chills,  +fatigue, or  lassitude.  HEENT:   No headaches,  Difficulty swallowing,  Tooth/dental problems, or  Sore throat,                No sneezing, itching, ear ache,  +nasal congestion, post nasal drip,   CV:  No chest pain,  Orthopnea, PND, swelling in lower extremities, anasarca, dizziness, palpitations, syncope.   GI  No heartburn, indigestion, abdominal pain, nausea, vomiting, diarrhea, change in bowel habits, loss of appetite, bloody stools.   Resp:   No wheezing.  No chest wall deformity  Skin: no rash or lesions.  GU: no  dysuria, change in color of urine, no urgency or frequency.  No flank pain, no hematuria   MS:  No joint pain or swelling.  No decreased range of motion.  No back pain.  Psych:  No change in mood or affect. No depression or anxiety.  No memory loss.         Objective:   Physical Exam GEN: A/Ox3; pleasant , NAD, morbidly obese pt.   HEENT:  [place]/AT,  EACs-clear, TMs-wnl, NOSE-clear drainage , THROAT-clear, no lesions, no postnasal drip or exudate noted.   NECK:  Supple w/ fair ROM; no JVD; normal carotid impulses w/o bruits; no thyromegaly or nodules palpated; no lymphadenopathy.  RESP  Clear  P & A, diminished BS in bases  w/o, wheezes/ rales/ or rhonchi.no accessory muscle use, no dullness to percussion  CARD:  RRR, no m/r/g  , tr peripheral edema, pulses intact, no cyanosis or clubbing.  GI:   Soft & nt; nml bowel sounds; no organomegaly or masses detected.  Musco: Warm bil, no deformities or joint swelling noted.   Neuro: alert, no focal deficits noted.    Skin: Warm, no lesions or rashes         Assessment & Plan:

## 2012-05-09 NOTE — Progress Notes (Signed)
Reviewed and agree with assessment/plan. 

## 2012-05-09 NOTE — Assessment & Plan Note (Signed)
Cont on CPAP  

## 2012-05-09 NOTE — Assessment & Plan Note (Addendum)
Recurrent asthmatic flare w/ minimal infiltrate vs atx in Right mid lung  on xray - previous LLL infiltrate no evident on xray today She has multiple ER visits over last 6 months. Have advised will need close follow up  Have advised to taper steroids- weight continues to rise on steroids  Will add LABA   Plan  Finish Levaquin as directed.  Taper Prednisone 60mg  daily for 2 days then 50mg  daily for 4 d, 40mg  daily for 4 days and 30mg  daily for 4 days and hold at 20mg  daily  Mucinex DM Twice daily  As needed  Cough/congestion  Begin Brovana Neb Twice daily   Fluids and rest.  follow up Dr. Craige Cotta  In 2 -3 weeks with Chest xray.  Please contact office for sooner follow up if symptoms do not improve or worsen or seek emergency care

## 2012-05-09 NOTE — ED Provider Notes (Signed)
37 year old morbidly obese female with a history of asthma which is severe and requires continuous home oxygen therapy and CPAP at night presents with a complaint of increased shortness of breath and right-sided chest pain which occurred this evening and over from her sleep. She states that she has had increased coughing and shortness of breath over the last several days with increased green drainage from her nose and in her phlegm. She denies swelling of her legs or history of DVT or pulmonary embolism. She also denies fevers or sore throat.  She recently finished a course of Zithromax at her pulmonologist prescribed by phone. She has been using intermittent DuoNeb and Pulmicort therapy for shortness of breath though she admits that she does not take every 4-6 hours as her pulmonologist has scheduled her to take it.  On exam the patient has diffuse wheezing of her bilateral lungs. She is speaking in full sentences and not using accessory muscles. There are no focal rales. She has no peripheral edema, she is morbidly obese but is able to move around the room and ambulated without difficulty.  Assessment: Rule out pneumothorax and pneumonia with 2 view chest. Nebulizer therapy with albuterol and Atrovent, patient has finished a course of Zithromax, likely needs increased coverage with Levaquin.    I have personally interpreted the PA and Lateral 2 view chest xray and find there to be infiltrate consistent with likely R sided Community acquired pneumonia - pt has sat's of 100% on 2L of [place] which is her base O2 requirmement and after nebs feels much better - home on Levaquin.   Medical screening examination/treatment/procedure(s) were conducted as a shared visit with non-physician practitioner(s) and myself.  I personally evaluated the patient during the encounter    Vida Roller, MD 05/09/12 [phone]

## 2012-05-09 NOTE — Patient Instructions (Addendum)
Finish Levaquin as directed.  Taper Prednisone 60mg  daily for 2 days then 50mg  daily for 4 d, 40mg  daily for 4 days and 30mg  daily for 4 days and hold at 20mg  daily  Mucinex DM Twice daily  As needed  Cough/congestion  Fluids and rest.  follow up Dr. Craige Cotta  In 2 -3 weeks with Chest xray.  Please contact office for sooner follow up if symptoms do not improve or worsen or seek emergency care   Late add : Begin Brovana Neb Twice daily

## 2012-05-15 ENCOUNTER — Ambulatory Visit: Payer: Medicaid Other | Admitting: Oncology

## 2012-05-26 ENCOUNTER — Ambulatory Visit: Payer: Medicaid Other | Admitting: Nurse Practitioner

## 2012-05-26 ENCOUNTER — Ambulatory Visit: Payer: Medicaid Other | Admitting: Pulmonary Disease

## 2012-05-29 ENCOUNTER — Other Ambulatory Visit: Payer: Self-pay | Admitting: Pulmonary Disease

## 2012-06-03 NOTE — Telephone Encounter (Signed)
Prednisone RX sent to reflect that pt should be taking 20 mg daily as instructed at 05/09/12 OV with TP.

## 2012-06-10 ENCOUNTER — Ambulatory Visit (INDEPENDENT_AMBULATORY_CARE_PROVIDER_SITE_OTHER): Payer: Medicaid Other | Admitting: Pulmonary Disease

## 2012-06-10 ENCOUNTER — Encounter: Payer: Self-pay | Admitting: Pulmonary Disease

## 2012-06-10 ENCOUNTER — Ambulatory Visit (INDEPENDENT_AMBULATORY_CARE_PROVIDER_SITE_OTHER)
Admission: RE | Admit: 2012-06-10 | Discharge: 2012-06-10 | Disposition: A | Discharge status: Home / Self Care | Payer: Medicaid Other | Source: Ambulatory Visit | Attending: Pulmonary Disease | Admitting: Pulmonary Disease

## 2012-06-10 VITALS — BP 138/82 | HR 100 | Temp 98.2°F | Ht 62.0 in | Wt 290.0 lb

## 2012-06-10 DIAGNOSIS — J189 Pneumonia, unspecified organism: Secondary | ICD-10-CM

## 2012-06-10 DIAGNOSIS — J9611 Chronic respiratory failure with hypoxia: Secondary | ICD-10-CM

## 2012-06-10 DIAGNOSIS — R0902 Hypoxemia: Secondary | ICD-10-CM

## 2012-06-10 DIAGNOSIS — G4733 Obstructive sleep apnea (adult) (pediatric): Secondary | ICD-10-CM

## 2012-06-10 DIAGNOSIS — IMO0002 Reserved for concepts with insufficient information to code with codable children: Secondary | ICD-10-CM

## 2012-06-10 DIAGNOSIS — J45909 Unspecified asthma, uncomplicated: Secondary | ICD-10-CM

## 2012-06-10 DIAGNOSIS — J9610 Chronic respiratory failure, unspecified whether with hypoxia or hypercapnia: Secondary | ICD-10-CM

## 2012-06-10 DIAGNOSIS — J329 Chronic sinusitis, unspecified: Secondary | ICD-10-CM

## 2012-06-10 MED ORDER — PREDNISONE 10 MG PO TABS: ORAL_TABLET | ORAL | Status: DC

## 2012-06-10 NOTE — Patient Instructions (Signed)
Change prednisone to 15 mg daily for 2 weeks, then 10 mg daily until next visit. Follow up in 4 weeks

## 2012-06-10 NOTE — Assessment & Plan Note (Signed)
Resolved clinically and radiographically.  I reviewed her chest xray images with her today.

## 2012-06-10 NOTE — Assessment & Plan Note (Signed)
Improved.  Will gradually decrease her prednisone to 10 mg daily until next visit.  She is to continue pulmicort, singulair, and duoneb.

## 2012-06-10 NOTE — Assessment & Plan Note (Signed)
Continue on CPAP  

## 2012-06-10 NOTE — Assessment & Plan Note (Signed)
She has mild symptoms at present.  She is to continue nasal irrigation, flonase, and claritin for now.

## 2012-06-10 NOTE — Assessment & Plan Note (Signed)
She is to continue supplemental oxygen.  Will re-assess her oxygen needs at next visit if she continues to improve.

## 2012-06-10 NOTE — Progress Notes (Signed)
Chief Complaint  Patient presents with  . Follow-up    w/ CXR. Pt still feeling SOB, cough w/ smaller pieces green phlem, wheezing and chest tx at night, slight nasal congestion and PND, facial pressure x 1 1/2 weeks.     History of Present Illness: Cheyenne Arias is a 37 y.o. female with asthma, OSA, OHS.  [phone] home, [phone] cell)  She is doing better.  She remains on 20 mg prednisone daily.  She has decreased cough and sputum.  She gets occasional wheeze.  She uses duoneb every 6 to 8 hours, and this helps.  She has mild sinus congestion.  She denies fever.  She is doing well with CPAP.  She remains on oxygen 24/7.  Tests: PSG 03/27/11 >> AHI 10.9 Echo 09/20/11 > >mod LVH, EF 65 to 70%, small/mod pericardial effusion  Doppler legs 09/21/11 >> no DVT CT chest 09/22/11 >> no PE, Lt base ATX/ASD, borderline cardiomegaly Spirometry 01/10/12 >> FEV1 1.72 (71%), FEV1% 77 CT sinus 02/28/12 >> clear paranasal sinuses RAST 02/28/12 >> IgE 15.6, moderate reaction to [facility] CT chest 04/03/12 >> ASD superior segment LLL, RLL ATX   has a past medical history of Hypertension; Migraine; Anxiety; Abnormal Pap smear; Anemia; Fibroids; Fibromyalgia; Depression; Sleep apnea; Shortness of breath; On home oxygen therapy; PONV (postoperative nausea and vomiting); Low iron; Morbid obesity with BMI of 45.0-49.9, adult; Asthma; and Iron deficiency anemia (04/24/2012).   has past surgical history that includes Wisdom tooth extraction; Mandible surgery (2008); Carpal tunnel release; Cesarean section; and Cesarean section (11/28/2011).  Prior to Admission medications   Medication Sig Start Date End Date Taking? Authorizing Provider  albuterol (PROVENTIL HFA;VENTOLIN HFA) 108 (90 BASE) MCG/ACT inhaler Inhale 2 puffs into the lungs every 6 (six) hours as needed. for asthma/shortness of breath 02/15/12  Yes [name], MD  amLODipine (NORVASC) 10 MG tablet Take 1 tablet (10 mg total) by mouth daily.  09/28/11 09/27/12 Yes Myra Inda Coke, MD  budesonide (PULMICORT) 0.5 MG/2ML nebulizer solution 1 vial twice a day Dx 493.90 04/14/12  Yes [name], MD  clonazePAM (KLONOPIN) 0.5 MG tablet Take 0.5 mg by mouth 2 (two) times daily as needed.   Yes Historical Provider, MD  cyclobenzaprine (FLEXERIL) 10 MG tablet Take 10 mg by mouth 3 (three) times daily as needed. For muscle spasms   Yes Historical Provider, MD  ferrous sulfate 325 (65 FE) MG tablet Take 1 tablet (325 mg total) by mouth daily with breakfast. 11/30/11 11/29/12 Yes Jacklyn Shell, CNM  FLUoxetine (PROZAC) 20 MG tablet Take 20 mg by mouth daily.   Yes Historical Provider, MD  fluticasone (FLONASE) 50 MCG/ACT nasal spray Place 2 sprays into the nose 2 (two) times daily. 09/28/11 09/27/12 Yes Myra Inda Coke, MD  furosemide (LASIX) 40 MG tablet Take 1 tablet (40 mg total) by mouth daily. 10/25/11 10/24/12 Yes Reva Bores, MD  ipratropium-albuterol (DUONEB) 0.5-2.5 (3) MG/3ML SOLN Take 3 mLs by nebulization every 4 (four) hours as needed. For asthma symptoms   Yes Historical Provider, MD  loratadine (CLARITIN) 10 MG tablet Take 1 tablet (10 mg total) by mouth daily. 09/28/11 09/27/12 Yes Myra Inda Coke, MD  metoprolol tartrate (LOPRESSOR) 25 MG tablet Take 1 tablet (25 mg total) by mouth 2 (two) times daily. 09/28/11 09/27/12 Yes Myra Inda Coke, MD  montelukast (SINGULAIR) 10 MG tablet Take 1 tablet (10 mg total) by mouth at bedtime. 01/10/12  Yes [name], MD  nystatin cream (MYCOSTATIN) Apply to affected  area 2 times daily 04/08/12  Yes Rolan Bucco, MD  oxyCODONE-acetaminophen (PERCOCET/ROXICET) 5-325 MG per tablet Take 2 tablets by mouth every 4 (four) hours as needed for pain. 02/07/12  Yes Kaitlyn Szekalski, PA-C  potassium chloride SA (K-DUR,KLOR-CON) 20 MEQ tablet Take 1 tablet (20 mEq total) by mouth daily. 10/22/11 10/21/12 Yes Reva Bores, MD  predniSONE (DELTASONE) 10 MG tablet 1.5 pills daily for 2 weeks, then 1 pill daily 06/10/12  Yes  [name], MD  pregabalin (LYRICA) 75 MG capsule Take 75 mg by mouth 2 (two) times daily.   Yes Historical Provider, MD  Prenatal Vit-Fe Fumarate-FA (PRENATAL MULTIVITAMIN) TABS Take 1 tablet by mouth every morning.   Yes Historical Provider, MD  PULMICORT 0.5 MG/2ML nebulizer solution TAKE 1 VIAL (0.5 MG TOTAL) BY NEBULIZATION 2 (TWO) TIMES DAILY. 03/25/12  Yes [name], MD  rizatriptan (MAXALT) 10 MG tablet Take 10 mg by mouth as needed. May repeat in 2 hours if needed   Yes Historical Provider, MD  senna-docusate (SENOKOT-S) 8.6-50 MG per tablet Take 2 tablets by mouth at bedtime. 11/30/11 11/29/12 Yes Scarlette Calico Cresenzo-Dishmon, CNM  Topiramate (TOPAMAX PO) Take by mouth as needed.   Yes Historical Provider, MD  zolpidem (AMBIEN) 5 MG tablet Take 5 mg by mouth at bedtime as needed.   Yes Historical Provider, MD   Allergies  Allergen Reactions  . Latex Itching and Swelling    Blisters  . Morphine And Related Itching    Physical Exam:  General - no distress, using oxygen HEENT - no sinus tenderness, no oral exudate, no LAN Cardiac - s1s2 regular, no murmur Chest - prolonged exhalation, no wheezing/rales Abd - obese, soft, non-tender Ext - no edema Neuro - normal strength Psych - normal mood behavior  Dg Chest 2 View  06/10/2012  *RADIOLOGY REPORT*  Clinical Data: Cough and shortness of breath; recent pneumonia  CHEST - 2 VIEW  Comparison: May 09, 2012  Findings: Lungs are now clear.  Heart size and pulmonary vascularity are normal.  No adenopathy.  No bone lesions.  IMPRESSION: Previous subtle opacity in the right mid lung has cleared.  Lungs are now clear.   Original Report Authenticated By: Bretta Bang, M.D.       Assessment/Plan:  [name], MD [place] [facility] Pager:  [phone] 06/10/2012, 2:43 PM

## 2012-06-25 ENCOUNTER — Other Ambulatory Visit: Payer: Medicaid Other

## 2012-07-09 ENCOUNTER — Ambulatory Visit: Payer: Medicaid Other | Admitting: Pulmonary Disease

## 2012-07-11 ENCOUNTER — Telehealth: Payer: Self-pay | Admitting: Pulmonary Disease

## 2012-07-11 NOTE — Telephone Encounter (Signed)
Pt scheduled to see TP on 3-31-114. Carron Curie, CMA

## 2012-07-11 NOTE — Telephone Encounter (Signed)
Dr Craige Cotta, do you want the pt to f/u with TP or do want to overbook to see her?  Please advise thanks

## 2012-07-11 NOTE — Telephone Encounter (Signed)
Whatever can be arranged earliest.

## 2012-07-14 ENCOUNTER — Ambulatory Visit: Payer: Medicaid Other | Admitting: Adult Health

## 2012-07-16 ENCOUNTER — Encounter: Payer: Self-pay | Admitting: Adult Health

## 2012-07-16 ENCOUNTER — Ambulatory Visit (INDEPENDENT_AMBULATORY_CARE_PROVIDER_SITE_OTHER): Payer: Medicaid Other | Admitting: Adult Health

## 2012-07-16 VITALS — BP 110/64 | HR 107 | Temp 98.9°F | Ht 62.0 in | Wt 289.0 lb

## 2012-07-16 DIAGNOSIS — IMO0002 Reserved for concepts with insufficient information to code with codable children: Secondary | ICD-10-CM

## 2012-07-16 DIAGNOSIS — G4733 Obstructive sleep apnea (adult) (pediatric): Secondary | ICD-10-CM

## 2012-07-16 DIAGNOSIS — J45909 Unspecified asthma, uncomplicated: Secondary | ICD-10-CM

## 2012-07-16 NOTE — Progress Notes (Signed)
Subjective:    Patient ID: Cheyenne Arias, female    DOB: Aug 15, 1975, 37 y.o.   MRN: [id number] HPI 37  yo G3 P2 female seen for initial pulmonary consult during hospitalization 09/19/11 for asthma flare during pregnancy .  She has history of mild, intermittent asthma.  She has hypertension. She has a history of mild sleep apnea (PSG 03/27/11>>AHI 10.9), and is on CPAP 10 cm H2O.  10/30/2011 Admitted 6/1-6/14 /13 for asthma exacerbation during pregnancy-c/b sinusitis, OSA  Tx w aggressive pulmonary hygiene , abx and steroids along with CPAP. Started on oxygen due to desaturations noted by Ob service. Ct angio, duplex neg, echo -nml LV fn Seen on 10/09/2011 for post hospital visit  Developed an acne like rash along chest , back and upper arms , went to ER , rx short steroid taper on 6/23. cpap - was on 10 now On auto 5-15  c section planned for 8/5 /13  Acute visit today for  c/o chest congestion, cough w/ clumps of green phlem, increase in SOB chest heaviness that last all day x 3 days. she has been using the pulmicort q12hrs and atrovent every 4 hours. Since discharge feels better but still gets winded easily.  Some wheezing on /off.  Wears O2 continuously.  Wearing CPAP every night.  Does have some congestion-clear.  Finished prednisone .    She is doing well with CPAP.  She remains on oxygen 24/7.  Tests: PSG 03/27/11>>AHI 10.9 Echo 09/20/11>>mod LVH, EF 65 to 70%, small/mod pericardial effusion  Doppler legs 09/21/11>>no DVT CT chest 09/22/11>>no PE, Lt base ATX/ASD, borderline cardiomegaly Spirometry 01/10/12>>FEV1 1.72 (71%), FEV1% 77  03/27/2012 ER follow up  Pt walked in today to clinic to be seen . She was seen in ER 03/26/12 for increased cough /congestion w/ blood tinged mucus. She was dx with LLL CAP on cxr. . Tx w/ Levaquin 750mg  daily x 5 days  Complains that over 3 days she has had fever, cough with thick mucus plugs and blood tinged mucus.   She is concerned that she is  still coughing up blood tinged mucus. Very upset that she continues to have recurrent cough with productive large mucus plug that are very large.  She brought in a mucus plug soaked in water contained in a  baby food jar . It was plug with no obvious blood or clots noted.     No frank hemoptysis . No fever or edema  Painful cough in ribs at times, esp on left.  Mother and patient demand that she have a Bronchoscopy today, "this was recommended by the doctor in ER" . " I need to be flushed out"  I attempted to explain what a  Bronchoscopy is and how the procedure works . As well as PNA and typical course and treatment . However they were upset that a Bronch was not going to be done immediately. Used several references that they were not being provided with adequate care despite attempts to explain her condition.  They then demanded they be given " samples now because I do not have money"   Seen 3 weeks ago in office CT sinus was neg for sinusitis    IGE level was 15 . She has follow up with Dr. Craige Cotta  In 4 days .    05/09/12  ER follow up  Pt returns for 3 week follow up PNA - went to ED last night for tightness in bottom right lung, rattling/wheezing, dyspnea, chest congestion  w/ cough, head congestion w/ greem drainage x3days.  was given levaquin and prednisone 60mg .   06/10/12 Follow up  She is doing better.  She remains on 20 mg prednisone daily.  She has decreased cough and sputum.  She gets occasional wheeze.  She uses duoneb every 6 to 8 hours, and this helps.  She has mild sinus congestion.  She denies fever.  She is doing well with CPAP.  She remains on oxygen 24/7.  07/16/2012 Follow up  Missed previous follow up with Dr. Craige Cotta   Wears CPAP everynight, no problem Continues on O2 24/7 at 2l/m Still has cough with thick mucus plugs on /off.  No fever or hemoptysis, or increased edema.      Tests: PSG 03/27/11 >> AHI 10.9 Echo 09/20/11 > >mod LVH, EF 65 to 70%, small/mod pericardial  effusion  Doppler legs 09/21/11 >> no DVT CT chest 09/22/11 >> no PE, Lt base ATX/ASD, borderline cardiomegaly Spirometry 01/10/12 >> FEV1 1.72 (71%), FEV1% 77 CT sinus 02/28/12 >> clear paranasal sinuses RAST 02/28/12 >> IgE 15.6, moderate reaction to [facility] CT chest 04/03/12 >> ASD superior segment LLL, RLL ATX   Review of Systems Constitutional:   No  weight loss, night sweats,  + Fevers, chills,  +fatigue, or  lassitude.  HEENT:   No headaches,  Difficulty swallowing,  Tooth/dental problems, or  Sore throat,                No sneezing, itching, ear ache,  +nasal congestion, post nasal drip,   CV:  No chest pain,  Orthopnea, PND, swelling in lower extremities, anasarca, dizziness, palpitations, syncope.   GI  No heartburn, indigestion, abdominal pain, nausea, vomiting, diarrhea, change in bowel habits, loss of appetite, bloody stools.   Resp:   No wheezing.  No chest wall deformity  Skin: no rash or lesions.  GU: no dysuria, change in color of urine, no urgency or frequency.  No flank pain, no hematuria   MS:  No joint pain or swelling.  No decreased range of motion.  No back pain.  Psych:  No change in mood or affect. No depression or anxiety.  No memory loss.         Objective:   Physical Exam GEN: A/Ox3; pleasant , NAD, morbidly obese pt.   HEENT:  [place]/AT,  EACs-clear, TMs-wnl, NOSE-clear drainage , THROAT-clear, no lesions, no postnasal drip or exudate noted.   NECK:  Supple w/ fair ROM; no JVD; normal carotid impulses w/o bruits; no thyromegaly or nodules palpated; no lymphadenopathy.  RESP  Clear  P & A, diminished BS in bases  w/o, wheezes/ rales/ or rhonchi.no accessory muscle use, no dullness to percussion  CARD:  RRR, no m/r/g  , tr peripheral edema, pulses intact, no cyanosis or clubbing.  GI:   Soft & nt; nml bowel sounds; no organomegaly or masses detected.  Musco: Warm bil, no deformities or joint swelling noted.   Neuro: alert, no focal deficits noted.     Skin: Warm, no lesions or rashes         Assessment & Plan:

## 2012-07-16 NOTE — Patient Instructions (Addendum)
Decrease Prednisone 15mg  daily  Continue on CPAP At bedtime   Continue on Pulmicort Neb Twice daily   Use Duoneb every 6 hr As needed   follow up Dr. Craige Cotta in 4 weeks

## 2012-07-17 NOTE — Assessment & Plan Note (Signed)
Difficult to control Asthma without flare  Try to decrease steroids slowly if possible   Plan  Decrease Prednisone 15mg  daily  Continue on Pulmicort Neb Twice daily   Use Duoneb every 6 hr As needed   follow up Dr. Craige Cotta in 4 weeks

## 2012-07-17 NOTE — Progress Notes (Signed)
Reviewed and agree with assessment/plan. 

## 2012-07-17 NOTE — Assessment & Plan Note (Signed)
Wt loss  Continue on CPAP At bedtime    follow up Dr. Craige Cotta in 4 weeks

## 2012-07-27 ENCOUNTER — Other Ambulatory Visit (HOSPITAL_COMMUNITY): Payer: Self-pay | Admitting: Obstetrics & Gynecology

## 2012-07-27 ENCOUNTER — Emergency Department (HOSPITAL_BASED_OUTPATIENT_CLINIC_OR_DEPARTMENT_OTHER)
Admission: EM | Admit: 2012-07-27 | Discharge: 2012-07-27 | Disposition: A | Discharge status: Home / Self Care | Payer: Medicaid Other | Attending: Emergency Medicine | Admitting: Emergency Medicine

## 2012-07-27 ENCOUNTER — Encounter (HOSPITAL_BASED_OUTPATIENT_CLINIC_OR_DEPARTMENT_OTHER): Payer: Self-pay | Admitting: *Deleted

## 2012-07-27 DIAGNOSIS — G43909 Migraine, unspecified, not intractable, without status migrainosus: Secondary | ICD-10-CM | POA: Insufficient documentation

## 2012-07-27 DIAGNOSIS — Z79899 Other long term (current) drug therapy: Secondary | ICD-10-CM | POA: Insufficient documentation

## 2012-07-27 DIAGNOSIS — L739 Follicular disorder, unspecified: Secondary | ICD-10-CM

## 2012-07-27 DIAGNOSIS — IMO0002 Reserved for concepts with insufficient information to code with codable children: Secondary | ICD-10-CM | POA: Insufficient documentation

## 2012-07-27 DIAGNOSIS — Z8742 Personal history of other diseases of the female genital tract: Secondary | ICD-10-CM | POA: Insufficient documentation

## 2012-07-27 DIAGNOSIS — Z8739 Personal history of other diseases of the musculoskeletal system and connective tissue: Secondary | ICD-10-CM | POA: Insufficient documentation

## 2012-07-27 DIAGNOSIS — L738 Other specified follicular disorders: Secondary | ICD-10-CM | POA: Insufficient documentation

## 2012-07-27 DIAGNOSIS — I10 Essential (primary) hypertension: Secondary | ICD-10-CM | POA: Insufficient documentation

## 2012-07-27 DIAGNOSIS — D509 Iron deficiency anemia, unspecified: Secondary | ICD-10-CM | POA: Insufficient documentation

## 2012-07-27 DIAGNOSIS — J45909 Unspecified asthma, uncomplicated: Secondary | ICD-10-CM | POA: Insufficient documentation

## 2012-07-27 DIAGNOSIS — F411 Generalized anxiety disorder: Secondary | ICD-10-CM | POA: Insufficient documentation

## 2012-07-27 DIAGNOSIS — F329 Major depressive disorder, single episode, unspecified: Secondary | ICD-10-CM | POA: Insufficient documentation

## 2012-07-27 DIAGNOSIS — F3289 Other specified depressive episodes: Secondary | ICD-10-CM | POA: Insufficient documentation

## 2012-07-27 MED ORDER — LEVOFLOXACIN 500 MG PO TABS: 500.0000 mg | ORAL_TABLET | Freq: Every day | ORAL | Status: DC

## 2012-07-27 NOTE — ED Notes (Signed)
Rash on face over the past week, itches

## 2012-07-27 NOTE — ED Provider Notes (Addendum)
History     CSN: [id number]  Arrival date & time 07/27/12  0750   First MD Initiated Contact with Patient 07/27/12 0801      No chief complaint on file.   (Consider location/radiation/quality/duration/timing/severity/associated sxs/prior treatment) Patient is a 37 y.o. female presenting with rash. The history is provided by the patient.  Rash Location:  Face Facial rash location: bilateral jaw area. Quality: painful, redness and swelling   Quality: not blistering, not draining and not itchy   Pain details:    Quality:  Aching   Onset quality:  Gradual   Duration:  1 week   Timing:  Constant   Progression:  Worsening Severity:  Moderate Onset quality:  Gradual Progression:  Worsening Chronicity:  Recurrent Context comment:  Started after she used a razor on her face Relieved by:  Nothing Worsened by:  Contact Ineffective treatments:  Moisturizers Associated symptoms: no fever, no myalgias and no URI     Past Medical History  Diagnosis Date  . Hypertension   . Migraine   . Anxiety   . Abnormal Pap smear   . Anemia   . Fibroids   . Fibromyalgia     degenerative disc disease  . Depression   . Sleep apnea   . Shortness of breath   . On home oxygen therapy     2 liters [place]  . PONV (postoperative nausea and vomiting)   . Low iron     pt states low iron-for Feraheme today in [facility]  . Morbid obesity with BMI of 45.0-49.9, adult   . Asthma     recent admission with exacerbation of asthma 09/15/11  . Iron deficiency anemia 04/24/2012    Past Surgical History  Procedure Laterality Date  . Wisdom tooth extraction    . Mandible surgery  2008  . Carpal tunnel release    . Cesarean section      1994, 1996   . Cesarean section  11/28/2011    Procedure: CESAREAN SECTION;  Surgeon: Adam Phenix, MD;  Location: [facility];  Service: Obstetrics;  Laterality: N/A;    Family History  Problem Relation Age of Onset  . Sickle cell trait Maternal Aunt   . Sickle cell anemia Other      History  Substance Use Topics  . Smoking status: Never Smoker   . Smokeless tobacco: Never Used     Comment: Pt was exposed to 2nd hand smoke at work years ago.  . Alcohol Use: No    OB History   Grav Para Term Preterm Abortions TAB SAB Ect Mult Living   3 3 3       3       Review of Systems  Constitutional: Negative for fever.  Musculoskeletal: Negative for myalgias.  Skin: Positive for rash.  All other systems reviewed and are negative.    Allergies  Latex and Morphine and related  Home Medications   Current Outpatient Rx  Name  Route  Sig  Dispense  Refill  . albuterol (PROVENTIL HFA;VENTOLIN HFA) 108 (90 BASE) MCG/ACT inhaler   Inhalation   Inhale 2 puffs into the lungs every 6 (six) hours as needed. for asthma/shortness of breath   1 Inhaler   5   . amLODipine (NORVASC) 10 MG tablet   Oral   Take 1 tablet (10 mg total) by mouth daily.   31 tablet   6   . budesonide (PULMICORT) 0.5 MG/2ML nebulizer solution      1 vial  twice a day Dx 493.90   120 mL   3   . clonazePAM (KLONOPIN) 0.5 MG tablet   Oral   Take 0.5 mg by mouth 2 (two) times daily as needed.         . cyclobenzaprine (FLEXERIL) 10 MG tablet   Oral   Take 10 mg by mouth 3 (three) times daily as needed. For muscle spasms         . ferrous sulfate 325 (65 FE) MG tablet   Oral   Take 1 tablet (325 mg total) by mouth daily with breakfast.   30 tablet   1   . FLUoxetine (PROZAC) 20 MG tablet   Oral   Take 20 mg by mouth daily.         . fluticasone (FLONASE) 50 MCG/ACT nasal spray   Nasal   Place 2 sprays into the nose 2 (two) times daily.   16 g   12   . furosemide (LASIX) 40 MG tablet   Oral   Take 1 tablet (40 mg total) by mouth daily.   10 tablet   1   . ipratropium-albuterol (DUONEB) 0.5-2.5 (3) MG/3ML SOLN   Nebulization   Take 3 mLs by nebulization every 4 (four) hours as needed. For asthma symptoms         . loratadine (CLARITIN) 10 MG tablet   Oral    Take 1 tablet (10 mg total) by mouth daily.   31 tablet   12   . metoprolol tartrate (LOPRESSOR) 25 MG tablet   Oral   Take 1 tablet (25 mg total) by mouth 2 (two) times daily.   360 tablet   6   . montelukast (SINGULAIR) 10 MG tablet   Oral   Take 1 tablet (10 mg total) by mouth at bedtime.   30 tablet   5   . nystatin cream (MYCOSTATIN)      Apply to affected area 2 times daily   15 g   0   . oxyCODONE-acetaminophen (PERCOCET/ROXICET) 5-325 MG per tablet   Oral   Take 2 tablets by mouth every 4 (four) hours as needed for pain.   15 tablet   0   . potassium chloride SA (K-DUR,KLOR-CON) 20 MEQ tablet   Oral   Take 1 tablet (20 mEq total) by mouth daily.   10 tablet   1   . predniSONE (DELTASONE) 10 MG tablet   Oral   Take 20 mg by mouth daily.         . pregabalin (LYRICA) 75 MG capsule   Oral   Take 75 mg by mouth 2 (two) times daily.         . Prenatal Vit-Fe Fumarate-FA (PRENATAL MULTIVITAMIN) TABS   Oral   Take 1 tablet by mouth every morning.         . rizatriptan (MAXALT) 10 MG tablet   Oral   Take 10 mg by mouth as needed. May repeat in 2 hours if needed         . senna-docusate (SENOKOT-S) 8.6-50 MG per tablet   Oral   Take 2 tablets by mouth at bedtime.   30 tablet   0   . Topiramate (TOPAMAX PO)   Oral   Take by mouth as needed.         . zolpidem (AMBIEN) 5 MG tablet   Oral   Take 5 mg by mouth at bedtime as needed.  BP 147/83  Pulse 109  Temp(Src) 98.1 F (36.7 C) (Oral)  Resp 18  SpO2 100%  Physical Exam  Nursing note and vitals reviewed. Constitutional: She is oriented to person, place, and time. She appears well-developed and well-nourished. No distress.  HENT:  Head: Normocephalic and atraumatic.    Mouth/Throat: Oropharynx is clear and moist.  Eyes: EOM are normal. Pupils are equal, round, and reactive to light.  Pulmonary/Chest: Effort normal.  Neurological: She is alert and oriented to person,  place, and time.  Skin: Rash noted. Rash is maculopapular. Rash is not pustular and not vesicular. There is erythema.  Psychiatric: She has a normal mood and affect. Her behavior is normal.    ED Course  Procedures (including critical care time)  Labs Reviewed - No data to display No results found.   1. Folliculitis       MDM   Patient presenting do to a painful rash on the sides of her face that began after she used her raise her. Has been going on for approximately one week and states that she has a prior history of folliculitis requiring Levaquin for resolution due to being on chronic steroids. She states this is similar but is not as severe as prior. She had seen a dermatologist in the past and also confirm folliculitis. Will start patient on a course of Levaquin to hopefully resolve the rash before it gets worse. There is no sign of abscess at this time and no other systemic symptoms        Gwyneth Sprout, MD 07/27/12 1610  Gwyneth Sprout, MD 07/27/12 [id number]

## 2012-08-04 ENCOUNTER — Encounter: Payer: Self-pay | Admitting: *Deleted

## 2012-08-20 ENCOUNTER — Encounter: Payer: Self-pay | Admitting: Oncology

## 2012-08-20 ENCOUNTER — Other Ambulatory Visit: Payer: Medicaid Other | Admitting: Lab

## 2012-08-20 ENCOUNTER — Other Ambulatory Visit (HOSPITAL_BASED_OUTPATIENT_CLINIC_OR_DEPARTMENT_OTHER): Payer: Medicaid Other

## 2012-08-20 ENCOUNTER — Telehealth: Payer: Self-pay | Admitting: Oncology

## 2012-08-20 DIAGNOSIS — D509 Iron deficiency anemia, unspecified: Secondary | ICD-10-CM

## 2012-08-20 LAB — CBC WITH DIFFERENTIAL/PLATELET
BASO%: 0.6 % (ref 0.0–2.0)
Basophils Absolute: 0.1 10*3/uL (ref 0.0–0.1)
EOS%: 3.8 % (ref 0.0–7.0)
HCT: 31.9 % — ABNORMAL LOW (ref 34.8–46.6)
HGB: 10 g/dL — ABNORMAL LOW (ref 11.6–15.9)
LYMPH%: 19.9 % (ref 14.0–49.7)
MCH: 22 pg — ABNORMAL LOW (ref 25.1–34.0)
MCHC: 31.2 g/dL — ABNORMAL LOW (ref 31.5–36.0)
NEUT%: 69.9 % (ref 38.4–76.8)
Platelets: 426 10*3/uL — ABNORMAL HIGH (ref 145–400)

## 2012-08-20 NOTE — Telephone Encounter (Signed)
pt needed to r/s missed lab.Marland KitchenMarland KitchenMarland KitchenDone

## 2012-08-21 ENCOUNTER — Ambulatory Visit (INDEPENDENT_AMBULATORY_CARE_PROVIDER_SITE_OTHER): Payer: Medicaid Other | Admitting: Pulmonary Disease

## 2012-08-21 ENCOUNTER — Ambulatory Visit (INDEPENDENT_AMBULATORY_CARE_PROVIDER_SITE_OTHER): Payer: Medicaid Other

## 2012-08-21 ENCOUNTER — Ambulatory Visit (INDEPENDENT_AMBULATORY_CARE_PROVIDER_SITE_OTHER)
Admission: RE | Admit: 2012-08-21 | Discharge: 2012-08-21 | Disposition: A | Discharge status: Home / Self Care | Payer: Medicaid Other | Source: Ambulatory Visit | Attending: Pulmonary Disease | Admitting: Pulmonary Disease

## 2012-08-21 ENCOUNTER — Encounter: Payer: Self-pay | Admitting: Pulmonary Disease

## 2012-08-21 VITALS — BP 130/66 | HR 100 | Temp 98.4°F | Ht 62.0 in | Wt 285.0 lb

## 2012-08-21 DIAGNOSIS — R0902 Hypoxemia: Secondary | ICD-10-CM

## 2012-08-21 DIAGNOSIS — J9611 Chronic respiratory failure with hypoxia: Secondary | ICD-10-CM

## 2012-08-21 DIAGNOSIS — G4733 Obstructive sleep apnea (adult) (pediatric): Secondary | ICD-10-CM

## 2012-08-21 DIAGNOSIS — IMO0002 Reserved for concepts with insufficient information to code with codable children: Secondary | ICD-10-CM

## 2012-08-21 DIAGNOSIS — J9610 Chronic respiratory failure, unspecified whether with hypoxia or hypercapnia: Secondary | ICD-10-CM

## 2012-08-21 DIAGNOSIS — R05 Cough: Secondary | ICD-10-CM

## 2012-08-21 DIAGNOSIS — J45909 Unspecified asthma, uncomplicated: Secondary | ICD-10-CM

## 2012-08-21 DIAGNOSIS — J329 Chronic sinusitis, unspecified: Secondary | ICD-10-CM

## 2012-08-21 LAB — CBC WITH DIFFERENTIAL/PLATELET
Basophils Relative: 0.1 % (ref 0.0–3.0)
Eosinophils Absolute: 0.4 10*3/uL (ref 0.0–0.7)
Eosinophils Relative: 3.7 % (ref 0.0–5.0)
Hemoglobin: 10.7 g/dL — ABNORMAL LOW (ref 12.0–15.0)
Lymphocytes Relative: 18.2 % (ref 12.0–46.0)
MCHC: 32.1 g/dL (ref 30.0–36.0)
MCV: 69.4 fl — ABNORMAL LOW (ref 78.0–100.0)
Neutro Abs: 7.2 10*3/uL (ref 1.4–7.7)
RBC: 4.79 Mil/uL (ref 3.87–5.11)
WBC: 10.1 10*3/uL (ref 4.5–10.5)

## 2012-08-21 NOTE — Patient Instructions (Signed)
Lab test and chest xray today >> will call with results Follow up in 2 months

## 2012-08-21 NOTE — Progress Notes (Addendum)
Chief Complaint  Patient presents with  . Follow-up    Pt states she has good and bad days with her breathing- she is using 2 liters 24/7 and then uses the CPAP at night. Pt states if she does a lot of activity she gets SOB and the weather can affect her breathing. Today she is having slight wheezing, little coughing and brought up yellow-green phlem this AM.     History of Present Illness: Cheyenne Arias is a 37 y.o. female with asthma, OSA, OHS.  She was last seen by Rubye Oaks on April 2.  She had her prednisone decreased to 15 mg daily, and has remained on this.  She has good days, and bad days.  She still cough up "plugs" intermittently, but not as big or as frequent as before.  She intermittently gets wheezing from her chest.  She denies fever, or hemoptysis.  Her sinuses are doing okay, and she denies post-nasal drip or sore throat.  She continues to get winded with minimal activity.  She is doing well with CPAP, and can't sleep w/o it.  She remains on oxygen 24/7.  Contact information >> [phone] home, [phone] cell.  Tests: PSG 03/27/11 >> AHI 10.9 Echo 09/20/11 > >mod LVH, EF 65 to 70%, small/mod pericardial effusion  Doppler legs 09/21/11 >> no DVT CT chest 09/22/11 >> no PE, Lt base ATX/ASD, borderline cardiomegaly Spirometry 01/10/12 >> FEV1 1.72 (71%), FEV1% 77 CT sinus 02/28/12 >> clear paranasal sinuses RAST 02/28/12 >> IgE 15.6, moderate reaction to [facility] CT chest 04/03/12 >> ASD superior segment LLL, RLL ATX  She  has a past medical history of Hypertension; Migraine; Anxiety; Abnormal Pap smear; Anemia; Fibroids; Fibromyalgia; Depression; Sleep apnea; Shortness of breath; On home oxygen therapy; PONV (postoperative nausea and vomiting); Low iron; Morbid obesity with BMI of 45.0-49.9, adult; Asthma; and Iron deficiency anemia (04/24/2012).  She  has past surgical history that includes Wisdom tooth extraction; Mandible surgery (2008); Carpal tunnel release;  Cesarean section; and Cesarean section (11/28/2011).    Prior to Admission medications   Medication Sig Start Date End Date Taking? Authorizing Provider  albuterol (PROVENTIL HFA;VENTOLIN HFA) 108 (90 BASE) MCG/ACT inhaler Inhale 2 puffs into the lungs every 6 (six) hours as needed. for asthma/shortness of breath 02/15/12  Yes [name], MD  amLODipine (NORVASC) 10 MG tablet Take 1 tablet (10 mg total) by mouth daily. 09/28/11 09/27/12 Yes Myra Inda Coke, MD  budesonide (PULMICORT) 0.5 MG/2ML nebulizer solution 1 vial twice a day Dx 493.90 04/14/12  Yes [name], MD  clonazePAM (KLONOPIN) 0.5 MG tablet Take 0.5 mg by mouth 2 (two) times daily as needed.   Yes Historical Provider, MD  cyclobenzaprine (FLEXERIL) 10 MG tablet Take 10 mg by mouth 3 (three) times daily as needed. For muscle spasms   Yes Historical Provider, MD  ferrous sulfate 325 (65 FE) MG tablet Take 1 tablet (325 mg total) by mouth daily with breakfast. 11/30/11 11/29/12 Yes Jacklyn Shell, CNM  FLUoxetine (PROZAC) 20 MG tablet Take 20 mg by mouth daily.   Yes Historical Provider, MD  fluticasone (FLONASE) 50 MCG/ACT nasal spray Place 2 sprays into the nose 2 (two) times daily. 09/28/11 09/27/12 Yes Myra Inda Coke, MD  furosemide (LASIX) 40 MG tablet Take 1 tablet (40 mg total) by mouth daily. 10/25/11 10/24/12 Yes Reva Bores, MD  ipratropium-albuterol (DUONEB) 0.5-2.5 (3) MG/3ML SOLN Take 3 mLs by nebulization every 4 (four) hours as needed. For asthma symptoms  Yes Historical Provider, MD  loratadine (CLARITIN) 10 MG tablet Take 1 tablet (10 mg total) by mouth daily. 09/28/11 09/27/12 Yes Myra Inda Coke, MD  metoprolol tartrate (LOPRESSOR) 25 MG tablet Take 1 tablet (25 mg total) by mouth 2 (two) times daily. 09/28/11 09/27/12 Yes Myra Inda Coke, MD  montelukast (SINGULAIR) 10 MG tablet Take 1 tablet (10 mg total) by mouth at bedtime. 01/10/12  Yes [name], MD  nystatin cream (MYCOSTATIN) Apply to affected area 2 times daily 04/08/12   Yes Rolan Bucco, MD  oxyCODONE-acetaminophen (PERCOCET/ROXICET) 5-325 MG per tablet Take 2 tablets by mouth every 4 (four) hours as needed for pain. 02/07/12  Yes Kaitlyn Szekalski, PA-C  potassium chloride SA (K-DUR,KLOR-CON) 20 MEQ tablet Take 1 tablet (20 mEq total) by mouth daily. 10/22/11 10/21/12 Yes Reva Bores, MD  predniSONE (DELTASONE) 10 MG tablet 1.5 pills daily for 2 weeks, then 1 pill daily 06/10/12  Yes [name], MD  pregabalin (LYRICA) 75 MG capsule Take 75 mg by mouth 2 (two) times daily.   Yes Historical Provider, MD  Prenatal Vit-Fe Fumarate-FA (PRENATAL MULTIVITAMIN) TABS Take 1 tablet by mouth every morning.   Yes Historical Provider, MD  PULMICORT 0.5 MG/2ML nebulizer solution TAKE 1 VIAL (0.5 MG TOTAL) BY NEBULIZATION 2 (TWO) TIMES DAILY. 03/25/12  Yes [name], MD  rizatriptan (MAXALT) 10 MG tablet Take 10 mg by mouth as needed. May repeat in 2 hours if needed   Yes Historical Provider, MD  senna-docusate (SENOKOT-S) 8.6-50 MG per tablet Take 2 tablets by mouth at bedtime. 11/30/11 11/29/12 Yes Scarlette Calico Cresenzo-Dishmon, CNM  Topiramate (TOPAMAX PO) Take by mouth as needed.   Yes Historical Provider, MD  zolpidem (AMBIEN) 5 MG tablet Take 5 mg by mouth at bedtime as needed.   Yes Historical Provider, MD   Allergies  Allergen Reactions  . Latex Itching and Swelling    Blisters  . Morphine And Related Itching    Physical Exam:  General - no distress, using oxygen HEENT - no sinus tenderness, no oral exudate, no LAN Cardiac - s1s2 regular, no murmur Chest - prolonged exhalation, scattered rhonchi clear with coughing, no wheeze Abd - obese, soft, non-tender Ext - no edema Neuro - normal strength Psych - normal mood behavior   Assessment/Plan:  [name], MD [facility] Pager:  [phone] 08/21/2012, 4:01 PM

## 2012-08-22 NOTE — Assessment & Plan Note (Signed)
Stable on current regimen of flonase, claritin, and singulair.

## 2012-08-22 NOTE — Assessment & Plan Note (Signed)
She is to continue CPAP.

## 2012-08-22 NOTE — Assessment & Plan Note (Signed)
She has persistent symptoms in spite of being on aggressive regimen.  Will continue pulmicort, duoneb nebulizer therapy, and singulair.  Will continue prednisone 15 mg daily for now.  Will repeat CXR, CBC with differential, and check for APBA.  Depending on results will then likely try to decrease her prednisone further with plan to eventually wean her off prednisone as tolerated.    At some point she will need pulmonary function testing, but has not been stable enough to do so to date.

## 2012-08-22 NOTE — Assessment & Plan Note (Signed)
Likely related to obesity hypoventilation.  She is to continue supplemental oxygen 24/7 at 2 liters.

## 2012-08-25 ENCOUNTER — Other Ambulatory Visit: Payer: Self-pay | Admitting: Oncology

## 2012-08-25 ENCOUNTER — Telehealth: Payer: Self-pay | Admitting: Pulmonary Disease

## 2012-08-25 ENCOUNTER — Telehealth: Payer: Self-pay | Admitting: *Deleted

## 2012-08-25 DIAGNOSIS — D649 Anemia, unspecified: Secondary | ICD-10-CM

## 2012-08-25 NOTE — Telephone Encounter (Signed)
I spoke with patient about results and she verbalized understanding and had no questions 

## 2012-08-25 NOTE — Telephone Encounter (Signed)
Patient called reporting Dr. Silverio Lay office called her this afternoon.  Instructed her to "call [facility] for anemia treatment because her values are critically low".  Expressed that she has breathing problems and her levels need to be improved.  Will notify providers.

## 2012-08-25 NOTE — Telephone Encounter (Signed)
Dg Chest 2 View  08/21/2012  *RADIOLOGY REPORT*  Clinical Data: Chronic cough, shortness of breath, history hypertension, asthma, fibromyalgia  CHEST - 2 VIEW  Comparison: 06/10/2012  Findings: Upper normal heart size. Mediastinal contours and pulmonary vascularity normal. Mild chronic peribronchial thickening. Lungs otherwise clear. No pleural effusion or pneumothorax. Bones unremarkable.  IMPRESSION: Mild chronic bronchitic changes. No acute abnormalities.   Original Report Authenticated By: Ulyses Southward, M.D.    Lab Results  Component Value Date   WBC 10.1 08/21/2012   HGB 10.7* 08/21/2012   HCT 33.3* 08/21/2012   MCV 69.4 Repeated and verified X2.* 08/21/2012   PLT 459.0* 08/21/2012    Will have my nurse inform patient that chest xray showed expected changes from asthma, but no evidence for pneumonia.   Her lab work shows persistent anemia >> she should follow up with hematology for this (has been followed by Dr. Gaylyn Rong).  Will have her decrease her dose of prednisone to 10 mg daily for 3 weeks.  If breathing is okay then decrease to 5 mg daily, and remain on this until next office visit.  No change to other pulmonary regimen.

## 2012-08-26 ENCOUNTER — Telehealth: Payer: Self-pay | Admitting: Oncology

## 2012-08-26 NOTE — Telephone Encounter (Signed)
S/W THE PT AND SHE IS AWARE OF THE LAB,MD AND IRON APPT ON 08/29/2012.

## 2012-08-29 ENCOUNTER — Ambulatory Visit: Payer: Medicaid Other

## 2012-08-29 ENCOUNTER — Encounter: Payer: Self-pay | Admitting: Oncology

## 2012-08-29 ENCOUNTER — Other Ambulatory Visit (HOSPITAL_BASED_OUTPATIENT_CLINIC_OR_DEPARTMENT_OTHER): Payer: Medicaid Other | Admitting: Lab

## 2012-08-29 ENCOUNTER — Ambulatory Visit (HOSPITAL_BASED_OUTPATIENT_CLINIC_OR_DEPARTMENT_OTHER): Payer: Medicaid Other | Admitting: Oncology

## 2012-08-29 ENCOUNTER — Telehealth: Payer: Self-pay | Admitting: Oncology

## 2012-08-29 VITALS — BP 117/76 | HR 99 | Temp 98.2°F | Resp 18 | Ht 62.0 in | Wt 279.7 lb

## 2012-08-29 DIAGNOSIS — D649 Anemia, unspecified: Secondary | ICD-10-CM

## 2012-08-29 DIAGNOSIS — D509 Iron deficiency anemia, unspecified: Secondary | ICD-10-CM

## 2012-08-29 DIAGNOSIS — N921 Excessive and frequent menstruation with irregular cycle: Secondary | ICD-10-CM

## 2012-08-29 LAB — CBC & DIFF AND RETIC
BASO%: 0.3 % (ref 0.0–2.0)
Basophils Absolute: 0 10*3/uL (ref 0.0–0.1)
HCT: 35.7 % (ref 34.8–46.6)
HGB: 10.7 g/dL — ABNORMAL LOW (ref 11.6–15.9)
Immature Retic Fract: 15.9 % — ABNORMAL HIGH (ref 1.60–10.00)
LYMPH%: 16 % (ref 14.0–49.7)
MCH: 21.4 pg — ABNORMAL LOW (ref 25.1–34.0)
MCHC: 30 g/dL — ABNORMAL LOW (ref 31.5–36.0)
MONO#: 0.5 10*3/uL (ref 0.1–0.9)
NEUT%: 76.9 % — ABNORMAL HIGH (ref 38.4–76.8)
Platelets: 425 10*3/uL — ABNORMAL HIGH (ref 145–400)
Retic Ct Abs: 74 10*3/uL (ref 33.70–90.70)
WBC: 10.6 10*3/uL — ABNORMAL HIGH (ref 3.9–10.3)
lymph#: 1.7 10*3/uL (ref 0.9–3.3)

## 2012-08-29 LAB — COMPREHENSIVE METABOLIC PANEL (CC13)
ALT: 23 U/L (ref 0–55)
AST: 22 U/L (ref 5–34)
BUN: 5.8 mg/dL — ABNORMAL LOW (ref 7.0–26.0)
Calcium: 8.6 mg/dL (ref 8.4–10.4)
Creatinine: 0.8 mg/dL (ref 0.6–1.1)
Total Bilirubin: 0.22 mg/dL (ref 0.20–1.20)

## 2012-08-29 LAB — IRON AND TIBC
%SAT: 4 % — ABNORMAL LOW (ref 20–55)
Iron: 16 ug/dL — ABNORMAL LOW (ref 42–145)

## 2012-08-29 LAB — FERRITIN: Ferritin: 51 ng/mL (ref 10–291)

## 2012-08-29 NOTE — Progress Notes (Signed)
[facility]  [phone] [phone]   OFFICE PROGRESS NOTE   Cc:  Kaleen Mask, MD  DIAGNOSIS: iron deficiency anemia due to menorrhagia.   CURRENT THERAPY:  IV iron as needed; cannot tolerate oral iron.   INTERVAL HISTORY: Cheyenne Arias 37 y.o. female returns for an appointment requested by her. Telephone notes in EPIC show that the patient called our office and stated that another physician told her she was severely anemic. Review of records show a recent hemoglobin of 10.7 on 08/21/2012. She has ongoing fatigue. Really no improvement of this since her Duncan Dull that was given in January 2014. She has ongoing dyspnea on exertion, shortness of breath, dizziness.  Her menstrual flow is quite heavy requiring her to change every hour or 2.  She has a discussed with her gynecologist about other measures to control metrorrhagia.  She is chronic diffuse pain from fibromyalgia. She just chronic shortness of breath due to asthma and she is on inhalers in addition to home oxygen.  She is positive Pap smear and is under the care of Gynecologist for this.  She denies any other visible source of bleeding such as gum bleed, hemoptysis, hematemesis, abdominal pain, melena, hematochezia, hematuria. States that she had worsening of her arthralgias following the Feraheme infusion. Requests previous iron formulation that she received under her prior hematologist. The rest of the 14 point review of system was negative.  She reported intermittent bilateral cervical adenopathy which resolves with or antibiotic. She has an area on her left neck that is swollen. She thinks this might be a cyst and is somewhat painful. Has been present about 2-3 weeks.  Past Medical History  Diagnosis Date  . Hypertension   . Migraine   . Anxiety   . Abnormal Pap smear   . Anemia   . Fibroids   . Fibromyalgia     degenerative disc disease  . Depression   . Sleep apnea   .  Shortness of breath   . On home oxygen therapy     2 liters [place]  . PONV (postoperative nausea and vomiting)   . Low iron     pt states low iron-for Feraheme today in [facility]  . Morbid obesity with BMI of 45.0-49.9, adult   . Asthma     recent admission with exacerbation of asthma 09/15/11  . Iron deficiency anemia 04/24/2012    Past Surgical History  Procedure Laterality Date  . Wisdom tooth extraction    . Mandible surgery  2008  . Carpal tunnel release    . Cesarean section      1994, 1996   . Cesarean section  11/28/2011    Procedure: CESAREAN SECTION;  Surgeon: Adam Phenix, MD;  Location: [facility];  Service: Obstetrics;  Laterality: N/A;    Current Outpatient Prescriptions  Medication Sig Dispense Refill  . albuterol (PROVENTIL HFA;VENTOLIN HFA) 108 (90 BASE) MCG/ACT inhaler Inhale 2 puffs into the lungs every 6 (six) hours as needed. for asthma/shortness of breath  1 Inhaler  5  . amLODipine (NORVASC) 10 MG tablet TAKE 1 TABLET (10 MG TOTAL) BY MOUTH DAILY.  31 tablet  6  . budesonide (PULMICORT) 0.5 MG/2ML nebulizer solution 1 vial twice a day Dx 493.90  120 mL  3  . clonazePAM (KLONOPIN) 0.5 MG tablet Take 0.5 mg by mouth 2 (two) times daily as needed.      . cyclobenzaprine (FLEXERIL) 10 MG tablet Take 10 mg by mouth  3 (three) times daily as needed. For muscle spasms      . ferrous sulfate 325 (65 FE) MG tablet Take 1 tablet (325 mg total) by mouth daily with breakfast.  30 tablet  1  . FLUoxetine (PROZAC) 20 MG tablet Take 20 mg by mouth daily.      . fluticasone (FLONASE) 50 MCG/ACT nasal spray Place 2 sprays into the nose 2 (two) times daily.  16 g  12  . furosemide (LASIX) 40 MG tablet Take 1 tablet (40 mg total) by mouth daily.  10 tablet  1  . ipratropium-albuterol (DUONEB) 0.5-2.5 (3) MG/3ML SOLN Take 3 mLs by nebulization every 4 (four) hours as needed. For asthma symptoms      . loratadine (CLARITIN) 10 MG tablet Take 1 tablet (10 mg total) by mouth daily.  31 tablet  12    . metoprolol tartrate (LOPRESSOR) 25 MG tablet Take 1 tablet (25 mg total) by mouth 2 (two) times daily.  360 tablet  6  . montelukast (SINGULAIR) 10 MG tablet Take 1 tablet (10 mg total) by mouth at bedtime.  30 tablet  5  . nystatin cream (MYCOSTATIN) Apply to affected area 2 times daily  15 g  0  . oxyCODONE-acetaminophen (PERCOCET/ROXICET) 5-325 MG per tablet Take 2 tablets by mouth every 4 (four) hours as needed for pain.  15 tablet  0  . potassium chloride SA (K-DUR,KLOR-CON) 20 MEQ tablet Take 1 tablet (20 mEq total) by mouth daily.  10 tablet  1  . predniSONE (DELTASONE) 10 MG tablet Take 15 mg by mouth daily.       . pregabalin (LYRICA) 75 MG capsule Take 75 mg by mouth 2 (two) times daily.      . Prenatal Vit-Fe Fumarate-FA (PRENATAL MULTIVITAMIN) TABS Take 1 tablet by mouth every morning.      . rizatriptan (MAXALT) 10 MG tablet Take 10 mg by mouth as needed. May repeat in 2 hours if needed      . senna-docusate (SENOKOT-S) 8.6-50 MG per tablet Take 2 tablets by mouth at bedtime.  30 tablet  0  . Topiramate (TOPAMAX PO) Take by mouth as needed.      . zolpidem (AMBIEN) 5 MG tablet Take 5 mg by mouth at bedtime as needed.       No current facility-administered medications for this visit.    ALLERGIES:  is allergic to latex and morphine and related.  REVIEW OF SYSTEMS:  The rest of the 14-point review of system was negative.   Filed Vitals:   08/29/12 1116  BP: 117/76  Pulse: 99  Temp: 98.2 F (36.8 C)  Resp: 18   Wt Readings from Last 3 Encounters:  08/29/12 279 lb 11.2 oz (126.871 kg)  08/21/12 285 lb (129.275 kg)  07/16/12 289 lb (131.09 kg)   ECOG Performance status:   PHYSICAL EXAMINATION:   General:  Obese woman, in no acute distress on oxygen nasal canula.  Eyes:  no scleral icterus.  ENT:  There were no oropharyngeal lesions.  Neck was without thyromegaly.  Lymphatics:  Negative cervical, supraclavicular or axillary adenopathy. Left neck cyst is palpable.  Respiratory: lungs were clear bilaterally without wheezing or crackles.  Cardiovascular:  Regular rate and rhythm, S1/S2, without murmur, rub or gallop.  There was no pedal edema.  GI:  abdomen was soft, flat, nontender, nondistended, without organomegaly.  Muscoloskeletal:  no spinal tenderness of palpation of vertebral spine.  Skin exam was without echymosis, petichae.  Neuro exam was nonfocal.  Patient was able to get on and off exam table without assistance.  Gait was normal.  Patient was alerted and oriented.  Attention was good.   Language was appropriate.  Mood was normal without depression.  Speech was not pressured.  Thought content was not tangential.     LABORATORY/RADIOLOGY DATA:  Lab Results  Component Value Date   WBC 10.1 08/21/2012   HGB 10.7* 08/21/2012   HCT 33.3* 08/21/2012   PLT 459.0* 08/21/2012   GLUCOSE 112* 03/31/2012   ALKPHOS 79 03/31/2012   ALT 23 03/31/2012   AST 18 03/31/2012   NA 138 03/31/2012   K 3.8 03/31/2012   CL 99 03/31/2012   CREATININE 0.9 03/31/2012   BUN 14 03/31/2012   CO2 26 03/31/2012    ASSESSMENT AND PLAN:   1.  menometrorrhagia: She is able to tolerate oral birth-control pills. However I advised her that there are more than just  birth-control pills to control her dysmenorrhagia. She was again hysterectomy. However endometrial ablation is an option. I defer this to gynecologist for advice.  2.  positive Pap smear: Followup with her gynecologist with yearly Pap smear.  3.  iron deficiency anemia due to dysmenorrhagia: Status post Feraheme in January 2014. Hemoglobin has now improved to 10.7. She continues to bleed heavily. She remains symptomatic from her anemia. I will give her InFeD next week. I have discussed with her at that this formulation of iron has a much higher risk of infusion reaction. She states understanding of this and would like to proceed. She will premedicate with Tylenol and Benadryl. She does not require a blood transfusion at  this time.  4.  fibromyalgia: She is on antidepressant Prozac per her psychiatrist.  5.  asthma: She is on home O2 additions to inhalers. She is also on oral prednisone taper which can account for her leukocytosis. There is no focal symptoms to suggest acute infection at this time  6.  hypertension: She is on amlodipine, metoprolol per PCP.  7.  left cervical cyst: I have referred her to ENT per her request.  7.  followup: CBC monthly as scheduled. She will keep her followup with me in August 14.  Case reviewed with Dr Gaylyn Rong.  The length of time of the face-to-face encounter was 30 minutes. More than 50% of time was spent counseling and coordination of care.

## 2012-08-29 NOTE — Telephone Encounter (Signed)
gv and printeda ppt sched and avs for pt...Marland KitchenENT needed Dr. on Robbie Lis access card to refer....sent MW eamiled to add tx on 5.21.14

## 2012-09-01 ENCOUNTER — Telehealth: Payer: Self-pay | Admitting: *Deleted

## 2012-09-01 ENCOUNTER — Telehealth: Payer: Self-pay | Admitting: Oncology

## 2012-09-01 NOTE — Telephone Encounter (Signed)
sw.. pt and advised on 5.22.14 appt.Marland KitchenMarland KitchenMarland KitchenMarland Kitchenpt ok and aware

## 2012-09-01 NOTE — Telephone Encounter (Signed)
Per staff message and POF I have scheduled appts.  JMW  

## 2012-09-04 ENCOUNTER — Ambulatory Visit (HOSPITAL_BASED_OUTPATIENT_CLINIC_OR_DEPARTMENT_OTHER): Payer: Medicaid Other

## 2012-09-04 VITALS — BP 117/65 | HR 103 | Temp 99.0°F | Resp 19

## 2012-09-04 DIAGNOSIS — D509 Iron deficiency anemia, unspecified: Secondary | ICD-10-CM

## 2012-09-04 MED ORDER — SODIUM CHLORIDE 0.9 % IV SOLN
25.0000 mg | Freq: Once | INTRAVENOUS | Status: DC
  Filled 2012-09-04: qty 0.5

## 2012-09-04 MED ORDER — DIPHENHYDRAMINE HCL 25 MG PO CAPS
25.0000 mg | ORAL_CAPSULE | Freq: Once | ORAL | Status: AC
  Administered 2012-09-04: 25 mg via ORAL

## 2012-09-04 MED ORDER — SODIUM CHLORIDE 0.9 % IV SOLN
1950.0000 mg | Freq: Once | INTRAVENOUS | Status: AC
  Administered 2012-09-04: 1950 mg via INTRAVENOUS
  Filled 2012-09-04: qty 39

## 2012-09-04 MED ORDER — SODIUM CHLORIDE 0.9 % IV SOLN
25.0000 mg | Freq: Once | INTRAVENOUS | Status: AC
  Administered 2012-09-04: 25 mg via INTRAVENOUS
  Filled 2012-09-04: qty 0.5

## 2012-09-04 MED ORDER — ACETAMINOPHEN 325 MG PO TABS
650.0000 mg | ORAL_TABLET | Freq: Once | ORAL | Status: AC
  Administered 2012-09-04: 650 mg via ORAL

## 2012-09-04 NOTE — Patient Instructions (Addendum)

## 2012-09-06 ENCOUNTER — Other Ambulatory Visit: Payer: Self-pay | Admitting: Pulmonary Disease

## 2012-09-11 ENCOUNTER — Encounter (HOSPITAL_BASED_OUTPATIENT_CLINIC_OR_DEPARTMENT_OTHER): Payer: Self-pay | Admitting: *Deleted

## 2012-09-11 ENCOUNTER — Emergency Department (HOSPITAL_BASED_OUTPATIENT_CLINIC_OR_DEPARTMENT_OTHER)
Admission: EM | Admit: 2012-09-11 | Discharge: 2012-09-11 | Disposition: A | Discharge status: Home / Self Care | Payer: Medicaid Other | Attending: Emergency Medicine | Admitting: Emergency Medicine

## 2012-09-11 DIAGNOSIS — Z79899 Other long term (current) drug therapy: Secondary | ICD-10-CM | POA: Insufficient documentation

## 2012-09-11 DIAGNOSIS — G4730 Sleep apnea, unspecified: Secondary | ICD-10-CM | POA: Insufficient documentation

## 2012-09-11 DIAGNOSIS — Z91040 Latex allergy status: Secondary | ICD-10-CM | POA: Insufficient documentation

## 2012-09-11 DIAGNOSIS — F3289 Other specified depressive episodes: Secondary | ICD-10-CM | POA: Insufficient documentation

## 2012-09-11 DIAGNOSIS — T304 Corrosion of unspecified body region, unspecified degree: Secondary | ICD-10-CM

## 2012-09-11 DIAGNOSIS — F329 Major depressive disorder, single episode, unspecified: Secondary | ICD-10-CM | POA: Insufficient documentation

## 2012-09-11 DIAGNOSIS — B9689 Other specified bacterial agents as the cause of diseases classified elsewhere: Secondary | ICD-10-CM

## 2012-09-11 DIAGNOSIS — Z8742 Personal history of other diseases of the female genital tract: Secondary | ICD-10-CM | POA: Insufficient documentation

## 2012-09-11 DIAGNOSIS — Z9981 Dependence on supplemental oxygen: Secondary | ICD-10-CM | POA: Insufficient documentation

## 2012-09-11 DIAGNOSIS — IMO0001 Reserved for inherently not codable concepts without codable children: Secondary | ICD-10-CM | POA: Insufficient documentation

## 2012-09-11 DIAGNOSIS — D509 Iron deficiency anemia, unspecified: Secondary | ICD-10-CM | POA: Insufficient documentation

## 2012-09-11 DIAGNOSIS — J45909 Unspecified asthma, uncomplicated: Secondary | ICD-10-CM | POA: Insufficient documentation

## 2012-09-11 DIAGNOSIS — N760 Acute vaginitis: Secondary | ICD-10-CM

## 2012-09-11 DIAGNOSIS — Z3202 Encounter for pregnancy test, result negative: Secondary | ICD-10-CM | POA: Insufficient documentation

## 2012-09-11 DIAGNOSIS — Z8679 Personal history of other diseases of the circulatory system: Secondary | ICD-10-CM | POA: Insufficient documentation

## 2012-09-11 DIAGNOSIS — I10 Essential (primary) hypertension: Secondary | ICD-10-CM | POA: Insufficient documentation

## 2012-09-11 DIAGNOSIS — N898 Other specified noninflammatory disorders of vagina: Secondary | ICD-10-CM | POA: Insufficient documentation

## 2012-09-11 DIAGNOSIS — F411 Generalized anxiety disorder: Secondary | ICD-10-CM | POA: Insufficient documentation

## 2012-09-11 DIAGNOSIS — IMO0002 Reserved for concepts with insufficient information to code with codable children: Secondary | ICD-10-CM | POA: Insufficient documentation

## 2012-09-11 DIAGNOSIS — D649 Anemia, unspecified: Secondary | ICD-10-CM | POA: Insufficient documentation

## 2012-09-11 LAB — URINALYSIS, ROUTINE W REFLEX MICROSCOPIC
Bilirubin Urine: NEGATIVE
Glucose, UA: NEGATIVE mg/dL
Hgb urine dipstick: NEGATIVE
Protein, ur: NEGATIVE mg/dL
Specific Gravity, Urine: 1.022 (ref 1.005–1.030)
Urobilinogen, UA: 0.2 mg/dL (ref 0.0–1.0)

## 2012-09-11 LAB — PREGNANCY, URINE: Preg Test, Ur: NEGATIVE

## 2012-09-11 MED ORDER — CEPHALEXIN 500 MG PO CAPS: 500.0000 mg | ORAL_CAPSULE | Freq: Four times a day (QID) | ORAL | Status: DC

## 2012-09-11 MED ORDER — METRONIDAZOLE 500 MG PO TABS: 500.0000 mg | ORAL_TABLET | Freq: Two times a day (BID) | ORAL | Status: DC

## 2012-09-11 MED ORDER — FLUCONAZOLE 150 MG PO TABS: 150.0000 mg | ORAL_TABLET | Freq: Once | ORAL | Status: DC
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
## 2012-09-11 NOTE — ED Notes (Signed)
MD at bedside explaining test results and plan of care for discharge.

## 2012-09-11 NOTE — ED Notes (Addendum)
Pt c/o fever , chills and nausea, vaginal discharge  x 4 days

## 2012-09-12 LAB — GC/CHLAMYDIA PROBE AMP: GC Probe RNA: NEGATIVE

## 2012-09-21 ENCOUNTER — Other Ambulatory Visit: Payer: Self-pay | Admitting: Pulmonary Disease

## 2012-10-10 ENCOUNTER — Other Ambulatory Visit (HOSPITAL_COMMUNITY): Payer: Self-pay | Admitting: Otolaryngology

## 2012-10-10 DIAGNOSIS — K118 Other diseases of salivary glands: Secondary | ICD-10-CM

## 2012-10-15 ENCOUNTER — Other Ambulatory Visit: Payer: Medicaid Other

## 2012-10-22 ENCOUNTER — Ambulatory Visit (HOSPITAL_COMMUNITY)
Admission: RE | Admit: 2012-10-22 | Discharge: 2012-10-22 | Disposition: A | Discharge status: Home / Self Care | Payer: Medicaid Other | Source: Ambulatory Visit | Attending: Otolaryngology | Admitting: Otolaryngology

## 2012-10-22 ENCOUNTER — Encounter: Payer: Self-pay | Admitting: Pulmonary Disease

## 2012-10-22 ENCOUNTER — Ambulatory Visit (INDEPENDENT_AMBULATORY_CARE_PROVIDER_SITE_OTHER): Payer: Medicaid Other | Admitting: Pulmonary Disease

## 2012-10-22 VITALS — BP 110/72 | HR 104 | Ht 62.0 in | Wt 277.0 lb

## 2012-10-22 DIAGNOSIS — J9610 Chronic respiratory failure, unspecified whether with hypoxia or hypercapnia: Secondary | ICD-10-CM

## 2012-10-22 DIAGNOSIS — IMO0002 Reserved for concepts with insufficient information to code with codable children: Secondary | ICD-10-CM

## 2012-10-22 DIAGNOSIS — G4733 Obstructive sleep apnea (adult) (pediatric): Secondary | ICD-10-CM

## 2012-10-22 DIAGNOSIS — J45909 Unspecified asthma, uncomplicated: Secondary | ICD-10-CM

## 2012-10-22 DIAGNOSIS — R0902 Hypoxemia: Secondary | ICD-10-CM

## 2012-10-22 DIAGNOSIS — R229 Localized swelling, mass and lump, unspecified: Secondary | ICD-10-CM | POA: Insufficient documentation

## 2012-10-22 DIAGNOSIS — K118 Other diseases of salivary glands: Secondary | ICD-10-CM

## 2012-10-22 DIAGNOSIS — J9611 Chronic respiratory failure with hypoxia: Secondary | ICD-10-CM

## 2012-10-22 DIAGNOSIS — J329 Chronic sinusitis, unspecified: Secondary | ICD-10-CM

## 2012-10-22 MED ORDER — PREDNISONE 10 MG PO TABS: 10.0000 mg | ORAL_TABLET | Freq: Every day | ORAL | Status: DC

## 2012-10-22 NOTE — Assessment & Plan Note (Signed)
Likely related to obesity hypoventilation.  She is to continue supplemental oxygen 24/7 at 2 liters.

## 2012-10-22 NOTE — Patient Instructions (Signed)
Will arrange for breathing test (PFT) Will get report from CPAP machine and call with results Change prednisone to 10 mg daily Follow up in 2 months

## 2012-10-22 NOTE — Assessment & Plan Note (Signed)
Improved.  Will arrange for PFT's.  Will decrease prednisone to 10 mg daily.  Will continue pulmicort, singulair, and prn duoneb.

## 2012-10-22 NOTE — Assessment & Plan Note (Signed)
Will get report from her CPAP machine and call her with results.

## 2012-10-22 NOTE — Progress Notes (Signed)
Chief Complaint  Patient presents with  . Asthma    Breathing is doing well. Has good days and bad days. Reports that the hot weather can make it difficult to breath.    History of Present Illness: Cheyenne Arias is a 37 y.o. female with asthma, OSA, OHS.  She still gets spells of cough with mucus plugs.  This is not as bad or as frequent as before.  She is only needing to use duoneb once per day.  She still has trouble in hot/humid weather.  She is not having chest pain.  Her sinuses are improved.   She was seen recently by ENT for swelling in her glands around her left ear.  She is doing well with CPAP, and can't sleep w/o it.  She remains on oxygen 24/7.  Contact information >> [phone] home, [phone] cell.  Tests: PSG 03/27/11 >> AHI 10.9 Echo 09/20/11 > >mod LVH, EF 65 to 70%, small/mod pericardial effusion  Doppler legs 09/21/11 >> no DVT CT chest 09/22/11 >> no PE, Lt base ATX/ASD, borderline cardiomegaly Spirometry 01/10/12 >> FEV1 1.72 (71%), FEV1% 77 CT sinus 02/28/12 >> clear paranasal sinuses RAST 02/28/12 >> IgE 15.6, moderate reaction to [facility] CT chest 04/03/12 >> ASD superior segment LLL, RLL ATX  She  has a past medical history of Hypertension; Migraine; Anxiety; Abnormal Pap smear; Anemia; Fibroids; Fibromyalgia; Depression; Sleep apnea; Shortness of breath; On home oxygen therapy; PONV (postoperative nausea and vomiting); Low iron; Morbid obesity with BMI of 45.0-49.9, adult; Asthma; and Iron deficiency anemia (04/24/2012).  She  has past surgical history that includes Wisdom tooth extraction; Mandible surgery (2008); Carpal tunnel release; Cesarean section; and Cesarean section (11/28/2011).    Prior to Admission medications   Medication Sig Start Date End Date Taking? Authorizing Provider  albuterol (PROVENTIL HFA;VENTOLIN HFA) 108 (90 BASE) MCG/ACT inhaler Inhale 2 puffs into the lungs every 6 (six) hours as needed. for asthma/shortness of breath 02/15/12  Yes  [name], MD  amLODipine (NORVASC) 10 MG tablet Take 1 tablet (10 mg total) by mouth daily. 09/28/11 09/27/12 Yes Myra Inda Coke, MD  budesonide (PULMICORT) 0.5 MG/2ML nebulizer solution 1 vial twice a day Dx 493.90 04/14/12  Yes [name], MD  clonazePAM (KLONOPIN) 0.5 MG tablet Take 0.5 mg by mouth 2 (two) times daily as needed.   Yes Historical Provider, MD  cyclobenzaprine (FLEXERIL) 10 MG tablet Take 10 mg by mouth 3 (three) times daily as needed. For muscle spasms   Yes Historical Provider, MD  ferrous sulfate 325 (65 FE) MG tablet Take 1 tablet (325 mg total) by mouth daily with breakfast. 11/30/11 11/29/12 Yes Jacklyn Shell, CNM  FLUoxetine (PROZAC) 20 MG tablet Take 20 mg by mouth daily.   Yes Historical Provider, MD  fluticasone (FLONASE) 50 MCG/ACT nasal spray Place 2 sprays into the nose 2 (two) times daily. 09/28/11 09/27/12 Yes Myra Inda Coke, MD  furosemide (LASIX) 40 MG tablet Take 1 tablet (40 mg total) by mouth daily. 10/25/11 10/24/12 Yes Reva Bores, MD  ipratropium-albuterol (DUONEB) 0.5-2.5 (3) MG/3ML SOLN Take 3 mLs by nebulization every 4 (four) hours as needed. For asthma symptoms   Yes Historical Provider, MD  loratadine (CLARITIN) 10 MG tablet Take 1 tablet (10 mg total) by mouth daily. 09/28/11 09/27/12 Yes Myra Inda Coke, MD  metoprolol tartrate (LOPRESSOR) 25 MG tablet Take 1 tablet (25 mg total) by mouth 2 (two) times daily. 09/28/11 09/27/12 Yes Myra Inda Coke, MD  montelukast (SINGULAIR) 10  MG tablet Take 1 tablet (10 mg total) by mouth at bedtime. 01/10/12  Yes [name], MD  nystatin cream (MYCOSTATIN) Apply to affected area 2 times daily 04/08/12  Yes Rolan Bucco, MD  oxyCODONE-acetaminophen (PERCOCET/ROXICET) 5-325 MG per tablet Take 2 tablets by mouth every 4 (four) hours as needed for pain. 02/07/12  Yes Kaitlyn Szekalski, PA-C  potassium chloride SA (K-DUR,KLOR-CON) 20 MEQ tablet Take 1 tablet (20 mEq total) by mouth daily. 10/22/11 10/21/12 Yes Reva Bores, MD   predniSONE (DELTASONE) 10 MG tablet 1.5 pills daily for 2 weeks, then 1 pill daily 06/10/12  Yes [name], MD  pregabalin (LYRICA) 75 MG capsule Take 75 mg by mouth 2 (two) times daily.   Yes Historical Provider, MD  Prenatal Vit-Fe Fumarate-FA (PRENATAL MULTIVITAMIN) TABS Take 1 tablet by mouth every morning.   Yes Historical Provider, MD  PULMICORT 0.5 MG/2ML nebulizer solution TAKE 1 VIAL (0.5 MG TOTAL) BY NEBULIZATION 2 (TWO) TIMES DAILY. 03/25/12  Yes [name], MD  rizatriptan (MAXALT) 10 MG tablet Take 10 mg by mouth as needed. May repeat in 2 hours if needed   Yes Historical Provider, MD  senna-docusate (SENOKOT-S) 8.6-50 MG per tablet Take 2 tablets by mouth at bedtime. 11/30/11 11/29/12 Yes Scarlette Calico Cresenzo-Dishmon, CNM  Topiramate (TOPAMAX PO) Take by mouth as needed.   Yes Historical Provider, MD  zolpidem (AMBIEN) 5 MG tablet Take 5 mg by mouth at bedtime as needed.   Yes Historical Provider, MD   Allergies  Allergen Reactions  . Latex Itching and Swelling    Blisters  . Morphine And Related Itching    Physical Exam:  General - no distress, using oxygen HEENT - no sinus tenderness, no oral exudate, no LAN Cardiac - s1s2 regular, no murmur Chest - no wheeze, rales Abd - obese, soft, non-tender Ext - no edema Neuro - normal strength Psych - normal mood behavior  US Soft Tissue Head/neck  10/22/2012   *RADIOLOGY REPORT*  Clinical Data: Palpable nodularity of the left face in the region of the parotid gland.  ULTRASOUND OF HEAD/NECK SOFT TISSUES  Technique:  Ultrasound examination of the head and neck soft tissues was performed in the area of clinical concern.  Comparison:  None.  Findings: There are two separate adjacent well circumscribed hypoechoic subcutaneous nodules identified in the region of palpable abnormality.  The first measures approximately 0.8 x 0.5 x 0.7 cm and the second approximately 0.8 x 0.7 x 0.9 cm.  Color Doppler shows some blood flow in both nodules and  they do appear to be deep to the fat.  These are likely lymph nodes either just superficial to, or within, the parotid gland.  IMPRESSION: Small and well circumscribed hypoechoic nodules are identified in the region of palpable abnormality overlying the left parotid gland.  These are felt most likely to represent small lymph nodes either within the parotid gland or superficial to the gland.   Original Report Authenticated By: Irish Lack, M.D.     Assessment/Plan:  [name], MD [place] [facility]/Critical Care/Sleep Pager:  [phone] 10/22/2012, 3:21 PM

## 2012-10-22 NOTE — Assessment & Plan Note (Signed)
Stable

## 2012-10-24 ENCOUNTER — Other Ambulatory Visit (HOSPITAL_COMMUNITY): Payer: Self-pay | Admitting: Obstetrics & Gynecology

## 2012-10-28 ENCOUNTER — Ambulatory Visit (HOSPITAL_COMMUNITY)
Admission: RE | Admit: 2012-10-28 | Discharge: 2012-10-28 | Disposition: A | Discharge status: Home / Self Care | Payer: Medicaid Other | Source: Ambulatory Visit | Attending: Otolaryngology | Admitting: Otolaryngology

## 2012-10-28 DIAGNOSIS — K118 Other diseases of salivary glands: Secondary | ICD-10-CM

## 2012-10-28 DIAGNOSIS — R220 Localized swelling, mass and lump, head: Secondary | ICD-10-CM | POA: Insufficient documentation

## 2012-10-28 DIAGNOSIS — R221 Localized swelling, mass and lump, neck: Secondary | ICD-10-CM | POA: Insufficient documentation

## 2012-10-28 DIAGNOSIS — R599 Enlarged lymph nodes, unspecified: Secondary | ICD-10-CM | POA: Insufficient documentation

## 2012-10-28 NOTE — Procedures (Signed)
Technically successful US guided biopsy of pre-auricular LN.  No immediate complications.

## 2012-11-14 ENCOUNTER — Other Ambulatory Visit (HOSPITAL_COMMUNITY): Payer: Self-pay | Admitting: Obstetrics & Gynecology

## 2012-12-10 ENCOUNTER — Other Ambulatory Visit: Payer: Medicaid Other | Admitting: Lab

## 2012-12-10 ENCOUNTER — Ambulatory Visit: Payer: Medicaid Other | Admitting: Oncology

## 2012-12-17 ENCOUNTER — Ambulatory Visit (INDEPENDENT_AMBULATORY_CARE_PROVIDER_SITE_OTHER): Payer: Medicaid Other | Admitting: Pulmonary Disease

## 2012-12-17 DIAGNOSIS — IMO0002 Reserved for concepts with insufficient information to code with codable children: Secondary | ICD-10-CM

## 2012-12-17 DIAGNOSIS — J45909 Unspecified asthma, uncomplicated: Secondary | ICD-10-CM

## 2012-12-17 LAB — PULMONARY FUNCTION TEST

## 2012-12-17 NOTE — Progress Notes (Signed)
PFT done today. 

## 2012-12-24 ENCOUNTER — Encounter: Payer: Self-pay | Admitting: Pulmonary Disease

## 2012-12-25 ENCOUNTER — Ambulatory Visit: Payer: Medicaid Other | Admitting: Pulmonary Disease

## 2012-12-27 ENCOUNTER — Other Ambulatory Visit (HOSPITAL_COMMUNITY): Payer: Self-pay | Admitting: Obstetrics & Gynecology

## 2012-12-27 ENCOUNTER — Other Ambulatory Visit: Payer: Self-pay | Admitting: Pulmonary Disease

## 2012-12-30 ENCOUNTER — Ambulatory Visit: Payer: Self-pay | Admitting: Nurse Practitioner

## 2012-12-30 ENCOUNTER — Telehealth: Payer: Self-pay | Admitting: Pulmonary Disease

## 2012-12-30 NOTE — Telephone Encounter (Signed)
PFT 12/17/12 >> FEV1 2.14 (95%), FEV1% 88, TLC 2.62 (58%), DLCO 96%, no BD  Will have my nurse inform pt that PFT was normal.  Will discuss in more detail at [facility] on 10/17.  No change to current treatment regimen.

## 2012-12-30 NOTE — Telephone Encounter (Signed)
lmtcb

## 2013-01-02 NOTE — Telephone Encounter (Signed)
Pt is aware of PFT results. 

## 2013-01-30 ENCOUNTER — Ambulatory Visit: Payer: Medicaid Other | Admitting: Pulmonary Disease

## 2013-02-02 ENCOUNTER — Encounter: Payer: Self-pay | Admitting: Pulmonary Disease

## 2013-02-02 ENCOUNTER — Ambulatory Visit (INDEPENDENT_AMBULATORY_CARE_PROVIDER_SITE_OTHER): Payer: Medicaid Other | Admitting: Pulmonary Disease

## 2013-02-02 VITALS — BP 120/82 | HR 100 | Ht 62.0 in | Wt 265.0 lb

## 2013-02-02 DIAGNOSIS — IMO0002 Reserved for concepts with insufficient information to code with codable children: Secondary | ICD-10-CM

## 2013-02-02 DIAGNOSIS — G4733 Obstructive sleep apnea (adult) (pediatric): Secondary | ICD-10-CM

## 2013-02-02 DIAGNOSIS — Z23 Encounter for immunization: Secondary | ICD-10-CM

## 2013-02-02 DIAGNOSIS — J45909 Unspecified asthma, uncomplicated: Secondary | ICD-10-CM

## 2013-02-02 DIAGNOSIS — J9611 Chronic respiratory failure with hypoxia: Secondary | ICD-10-CM

## 2013-02-02 DIAGNOSIS — J9610 Chronic respiratory failure, unspecified whether with hypoxia or hypercapnia: Secondary | ICD-10-CM

## 2013-02-02 DIAGNOSIS — R0902 Hypoxemia: Secondary | ICD-10-CM

## 2013-02-02 MED ORDER — PREDNISONE 5 MG PO TABS: ORAL_TABLET | ORAL | Status: DC

## 2013-02-02 NOTE — Progress Notes (Signed)
Chief Complaint  Patient presents with  . Follow-up    Breathing is improved. Not having to use O2 as much.    History of Present Illness: Cheyenne Arias is a 37 y.o. female with asthma, OSA, OHS.  She had several recent family issues that made it difficult for her to maintain follow up.  Her breathing is doing better.  She is not needing oxygen during the day as much.  She still gets cough with plugs.  She is not having wheeze or chest tightness.  She uses CPAP every night, and this helps.  She has lost 25 lbs since February 2014.  Contact information >> [phone] home, [phone] cell.  Tests: PSG 03/27/11 >> AHI 10.9 Echo 09/20/11 > >mod LVH, EF 65 to 70%, small/mod pericardial effusion  Doppler legs 09/21/11 >> no DVT CT chest 09/22/11 >> no PE, Lt base ATX/ASD, borderline cardiomegaly Spirometry 01/10/12 >> FEV1 1.72 (71%), FEV1% 77 CT sinus 02/28/12 >> clear paranasal sinuses RAST 02/28/12 >> IgE 15.6, moderate reaction to [facility] CT chest 04/03/12 >> ASD superior segment LLL, RLL ATX PFT 12/17/12 >> FEV1 2.14 (95%), FEV1% 88, TLC 2.62 (58%), DLCO 96%, no BD  She  has a past medical history of Hypertension; Migraine; Anxiety; Abnormal Pap smear; Anemia; Fibroids; Fibromyalgia; Depression; Sleep apnea; Shortness of breath; On home oxygen therapy; PONV (postoperative nausea and vomiting); Low iron; Morbid obesity with BMI of 45.0-49.9, adult; Asthma; and Iron deficiency anemia (04/24/2012).  She  has past surgical history that includes Wisdom tooth extraction; Mandible surgery (2008); Carpal tunnel release; Cesarean section; and Cesarean section (11/28/2011).    Prior to Admission medications   Medication Sig Start Date End Date Taking? Authorizing Provider  albuterol (PROVENTIL HFA;VENTOLIN HFA) 108 (90 BASE) MCG/ACT inhaler Inhale 2 puffs into the lungs every 6 (six) hours as needed. for asthma/shortness of breath 02/15/12  Yes [name], MD  amLODipine (NORVASC) 10 MG  tablet Take 1 tablet (10 mg total) by mouth daily. 09/28/11 09/27/12 Yes Myra Inda Coke, MD  budesonide (PULMICORT) 0.5 MG/2ML nebulizer solution 1 vial twice a day Dx 493.90 04/14/12  Yes [name], MD  clonazePAM (KLONOPIN) 0.5 MG tablet Take 0.5 mg by mouth 2 (two) times daily as needed.   Yes Historical Provider, MD  cyclobenzaprine (FLEXERIL) 10 MG tablet Take 10 mg by mouth 3 (three) times daily as needed. For muscle spasms   Yes Historical Provider, MD  ferrous sulfate 325 (65 FE) MG tablet Take 1 tablet (325 mg total) by mouth daily with breakfast. 11/30/11 11/29/12 Yes Jacklyn Shell, CNM  FLUoxetine (PROZAC) 20 MG tablet Take 20 mg by mouth daily.   Yes Historical Provider, MD  fluticasone (FLONASE) 50 MCG/ACT nasal spray Place 2 sprays into the nose 2 (two) times daily. 09/28/11 09/27/12 Yes Myra Inda Coke, MD  furosemide (LASIX) 40 MG tablet Take 1 tablet (40 mg total) by mouth daily. 10/25/11 10/24/12 Yes Reva Bores, MD  ipratropium-albuterol (DUONEB) 0.5-2.5 (3) MG/3ML SOLN Take 3 mLs by nebulization every 4 (four) hours as needed. For asthma symptoms   Yes Historical Provider, MD  loratadine (CLARITIN) 10 MG tablet Take 1 tablet (10 mg total) by mouth daily. 09/28/11 09/27/12 Yes Myra Inda Coke, MD  metoprolol tartrate (LOPRESSOR) 25 MG tablet Take 1 tablet (25 mg total) by mouth 2 (two) times daily. 09/28/11 09/27/12 Yes Myra Inda Coke, MD  montelukast (SINGULAIR) 10 MG tablet Take 1 tablet (10 mg total) by mouth at bedtime. 01/10/12  Yes  [name], MD  nystatin cream (MYCOSTATIN) Apply to affected area 2 times daily 04/08/12  Yes Rolan Bucco, MD  oxyCODONE-acetaminophen (PERCOCET/ROXICET) 5-325 MG per tablet Take 2 tablets by mouth every 4 (four) hours as needed for pain. 02/07/12  Yes Kaitlyn Szekalski, PA-C  potassium chloride SA (K-DUR,KLOR-CON) 20 MEQ tablet Take 1 tablet (20 mEq total) by mouth daily. 10/22/11 10/21/12 Yes Reva Bores, MD  predniSONE (DELTASONE) 10 MG tablet 1.5 pills  daily for 2 weeks, then 1 pill daily 06/10/12  Yes [name], MD  pregabalin (LYRICA) 75 MG capsule Take 75 mg by mouth 2 (two) times daily.   Yes Historical Provider, MD  Prenatal Vit-Fe Fumarate-FA (PRENATAL MULTIVITAMIN) TABS Take 1 tablet by mouth every morning.   Yes Historical Provider, MD  PULMICORT 0.5 MG/2ML nebulizer solution TAKE 1 VIAL (0.5 MG TOTAL) BY NEBULIZATION 2 (TWO) TIMES DAILY. 03/25/12  Yes [name], MD  rizatriptan (MAXALT) 10 MG tablet Take 10 mg by mouth as needed. May repeat in 2 hours if needed   Yes Historical Provider, MD  senna-docusate (SENOKOT-S) 8.6-50 MG per tablet Take 2 tablets by mouth at bedtime. 11/30/11 11/29/12 Yes Scarlette Calico Cresenzo-Dishmon, CNM  Topiramate (TOPAMAX PO) Take by mouth as needed.   Yes Historical Provider, MD  zolpidem (AMBIEN) 5 MG tablet Take 5 mg by mouth at bedtime as needed.   Yes Historical Provider, MD   Allergies  Allergen Reactions  . Latex Itching and Swelling    Blisters  . Morphine And Related Itching    Physical Exam:  General - no distress HEENT - no sinus tenderness, no oral exudate, no LAN Cardiac - s1s2 regular, no murmur Chest - no wheeze, rales Abd - obese, soft, non-tender Ext - no edema Neuro - normal strength Psych - normal mood behavior   Assessment/Plan:  [name], MD [place] [facility]monary/Critical Care/Sleep Pager:  [phone] 02/02/2013, 1:03 PM

## 2013-02-02 NOTE — Patient Instructions (Addendum)
Prednisone 5 mg pills >> use 1.5 daily for two weeks, then 1 pill daily Flu shot today Continue using oxygen with CPAP at night Follow up in 8 weeks

## 2013-02-03 NOTE — Assessment & Plan Note (Signed)
Continue CPAP with oxygen at night. °

## 2013-02-03 NOTE — Assessment & Plan Note (Signed)
She has some improvement.  Will continue to gradual decrease her prednisone dose as tolerated.  She is to continue singulair, pulmicort, and prn duoneb/albuterol.

## 2013-02-03 NOTE — Assessment & Plan Note (Signed)
Likely related to obesity-hypoventilation.  Improved with weight loss.  She is to continue with oxygen at night, but does not need supplemental oxygen during the day.

## 2013-02-05 ENCOUNTER — Other Ambulatory Visit: Payer: Self-pay | Admitting: Otolaryngology

## 2013-02-05 DIAGNOSIS — K118 Other diseases of salivary glands: Secondary | ICD-10-CM

## 2013-02-13 ENCOUNTER — Other Ambulatory Visit: Payer: Medicaid Other

## 2013-02-16 ENCOUNTER — Ambulatory Visit
Admission: RE | Admit: 2013-02-16 | Discharge: 2013-02-16 | Disposition: A | Discharge status: Home / Self Care | Payer: Medicaid Other | Source: Ambulatory Visit | Attending: Otolaryngology | Admitting: Otolaryngology

## 2013-02-16 DIAGNOSIS — K118 Other diseases of salivary glands: Secondary | ICD-10-CM

## 2013-02-21 ENCOUNTER — Emergency Department (HOSPITAL_BASED_OUTPATIENT_CLINIC_OR_DEPARTMENT_OTHER)
Admission: EM | Admit: 2013-02-21 | Discharge: 2013-02-21 | Disposition: A | Discharge status: Home / Self Care | Payer: Medicaid Other | Attending: Emergency Medicine | Admitting: Emergency Medicine

## 2013-02-21 ENCOUNTER — Emergency Department (HOSPITAL_BASED_OUTPATIENT_CLINIC_OR_DEPARTMENT_OTHER): Payer: Medicaid Other

## 2013-02-21 ENCOUNTER — Encounter (HOSPITAL_BASED_OUTPATIENT_CLINIC_OR_DEPARTMENT_OTHER): Payer: Self-pay | Admitting: Emergency Medicine

## 2013-02-21 DIAGNOSIS — Z3202 Encounter for pregnancy test, result negative: Secondary | ICD-10-CM | POA: Insufficient documentation

## 2013-02-21 DIAGNOSIS — Z9981 Dependence on supplemental oxygen: Secondary | ICD-10-CM | POA: Insufficient documentation

## 2013-02-21 DIAGNOSIS — R109 Unspecified abdominal pain: Secondary | ICD-10-CM | POA: Insufficient documentation

## 2013-02-21 DIAGNOSIS — F3289 Other specified depressive episodes: Secondary | ICD-10-CM | POA: Insufficient documentation

## 2013-02-21 DIAGNOSIS — Z6841 Body Mass Index (BMI) 40.0 and over, adult: Secondary | ICD-10-CM | POA: Insufficient documentation

## 2013-02-21 DIAGNOSIS — N390 Urinary tract infection, site not specified: Secondary | ICD-10-CM | POA: Insufficient documentation

## 2013-02-21 DIAGNOSIS — Z8742 Personal history of other diseases of the female genital tract: Secondary | ICD-10-CM | POA: Insufficient documentation

## 2013-02-21 DIAGNOSIS — I10 Essential (primary) hypertension: Secondary | ICD-10-CM | POA: Insufficient documentation

## 2013-02-21 DIAGNOSIS — IMO0002 Reserved for concepts with insufficient information to code with codable children: Secondary | ICD-10-CM | POA: Insufficient documentation

## 2013-02-21 DIAGNOSIS — G43909 Migraine, unspecified, not intractable, without status migrainosus: Secondary | ICD-10-CM | POA: Insufficient documentation

## 2013-02-21 DIAGNOSIS — J45909 Unspecified asthma, uncomplicated: Secondary | ICD-10-CM | POA: Insufficient documentation

## 2013-02-21 DIAGNOSIS — Z91040 Latex allergy status: Secondary | ICD-10-CM | POA: Insufficient documentation

## 2013-02-21 DIAGNOSIS — F411 Generalized anxiety disorder: Secondary | ICD-10-CM | POA: Insufficient documentation

## 2013-02-21 DIAGNOSIS — F329 Major depressive disorder, single episode, unspecified: Secondary | ICD-10-CM | POA: Insufficient documentation

## 2013-02-21 DIAGNOSIS — Z79899 Other long term (current) drug therapy: Secondary | ICD-10-CM | POA: Insufficient documentation

## 2013-02-21 DIAGNOSIS — Z8739 Personal history of other diseases of the musculoskeletal system and connective tissue: Secondary | ICD-10-CM | POA: Insufficient documentation

## 2013-02-21 DIAGNOSIS — G4730 Sleep apnea, unspecified: Secondary | ICD-10-CM | POA: Insufficient documentation

## 2013-02-21 DIAGNOSIS — D509 Iron deficiency anemia, unspecified: Secondary | ICD-10-CM | POA: Insufficient documentation

## 2013-02-21 LAB — CBC WITH DIFFERENTIAL/PLATELET
Basophils Absolute: 0 10*3/uL (ref 0.0–0.1)
Eosinophils Relative: 2 % (ref 0–5)
HCT: 35.2 % — ABNORMAL LOW (ref 36.0–46.0)
Lymphocytes Relative: 18 % (ref 12–46)
MCV: 75.1 fL — ABNORMAL LOW (ref 78.0–100.0)
Monocytes Absolute: 0.7 10*3/uL (ref 0.1–1.0)
RDW: 17 % — ABNORMAL HIGH (ref 11.5–15.5)
WBC: 13.6 10*3/uL — ABNORMAL HIGH (ref 4.0–10.5)

## 2013-02-21 LAB — URINALYSIS, ROUTINE W REFLEX MICROSCOPIC
Bilirubin Urine: NEGATIVE
Protein, ur: >300 mg/dL — AB
Specific Gravity, Urine: 1.018 (ref 1.005–1.030)
Urobilinogen, UA: 0.2 mg/dL (ref 0.0–1.0)

## 2013-02-21 LAB — BASIC METABOLIC PANEL
BUN: 8 mg/dL (ref 6–23)
CO2: 24 mEq/L (ref 19–32)
Calcium: 9.5 mg/dL (ref 8.4–10.5)
Creatinine, Ser: 0.7 mg/dL (ref 0.50–1.10)
Glucose, Bld: 103 mg/dL — ABNORMAL HIGH (ref 70–99)

## 2013-02-21 LAB — URINE MICROSCOPIC-ADD ON

## 2013-02-21 MED ORDER — IBUPROFEN 800 MG PO TABS: 800.0000 mg | ORAL_TABLET | Freq: Three times a day (TID) | ORAL | Status: DC

## 2013-02-21 MED ORDER — SODIUM CHLORIDE 0.9 % IV BOLUS (SEPSIS)
1000.0000 mL | Freq: Once | INTRAVENOUS | Status: AC
  Administered 2013-02-21: 1000 mL via INTRAVENOUS

## 2013-02-21 MED ORDER — KETOROLAC TROMETHAMINE 30 MG/ML IJ SOLN
30.0000 mg | Freq: Once | INTRAMUSCULAR | Status: AC
  Administered 2013-02-21: 30 mg via INTRAVENOUS
  Filled 2013-02-21: qty 1

## 2013-02-21 MED ORDER — CEFTRIAXONE SODIUM 1 G IJ SOLR
INTRAMUSCULAR | Status: AC
  Administered 2013-02-21: 1000 mg
  Filled 2013-02-21: qty 10

## 2013-02-21 MED ORDER — PHENAZOPYRIDINE HCL 200 MG PO TABS: 200.0000 mg | ORAL_TABLET | Freq: Three times a day (TID) | ORAL | Status: DC

## 2013-02-21 MED ORDER — ONDANSETRON HCL 4 MG/2ML IJ SOLN
4.0000 mg | Freq: Once | INTRAMUSCULAR | Status: AC
  Administered 2013-02-21: 4 mg via INTRAVENOUS
  Filled 2013-02-21: qty 2

## 2013-02-21 MED ORDER — CEPHALEXIN 500 MG PO CAPS: 500.0000 mg | ORAL_CAPSULE | Freq: Four times a day (QID) | ORAL | Status: DC

## 2013-02-21 MED ORDER — IBUPROFEN 800 MG PO TABS: 800.0000 mg | ORAL_TABLET | Freq: Once | ORAL | Status: DC

## 2013-02-21 MED ORDER — DEXTROSE 5 % IV SOLN: 1.0000 g | Freq: Once | INTRAVENOUS | Status: AC

## 2013-02-21 NOTE — ED Provider Notes (Signed)
Medical screening examination/treatment/procedure(s) were performed by non-physician practitioner and as supervising physician I was immediately available for consultation/collaboration.  EKG Interpretation   None        Belkys Henault, MD 02/21/13 2135 

## 2013-02-21 NOTE — ED Provider Notes (Signed)
CSN: [id number]     Arrival date & time 02/21/13  1322 History   First MD Initiated Contact with Patient 02/21/13 1516     Chief Complaint  Patient presents with  . Dysuria   (Consider location/radiation/quality/duration/timing/severity/associated sxs/prior Treatment) HPI Comments: Patient is a 37 year old female who presents with a 1 day history of dysuria and hematuria. The pain is located in her lower abdomen and does not radiate. The pain is described as "discomfort" and severe. The pain started gradually and progressively worsened since the onset. No alleviating/aggravating factors. The patient has tried nothing for symptoms without relief. Associated symptoms include hematuria. Patient denies fever, headache, NVD, chest pain, SOB, constipation, abnormal vaginal bleeding/discharge.     Patient is a 37 y.o. female presenting with dysuria.  Dysuria   Past Medical History  Diagnosis Date  . Hypertension   . Migraine   . Anxiety   . Abnormal Pap smear   . Anemia   . Fibroids   . Fibromyalgia     degenerative disc disease  . Depression   . Sleep apnea   . Shortness of breath   . On home oxygen therapy     2 liters [place]  . PONV (postoperative nausea and vomiting)   . Low iron     pt states low iron-for Feraheme today in [facility]  . Morbid obesity with BMI of 45.0-49.9, adult   . Asthma     recent admission with exacerbation of asthma 09/15/11  . Iron deficiency anemia 04/24/2012   Past Surgical History  Procedure Laterality Date  . Wisdom tooth extraction    . Mandible surgery  2008  . Carpal tunnel release    . Cesarean section      1994, 1996   . Cesarean section  11/28/2011    Procedure: CESAREAN SECTION;  Surgeon: Adam Phenix, MD;  Location: [facility] OR[facility];  Service: Obstetrics;  Laterality: N/A;   Family History  Problem Relation Age of Onset  . Sickle cell trait Maternal Aunt   . Sickle cell anemia Other    History  Substance Use Topics  . Smoking status: Never Smoker   .  Smokeless tobacco: Never Used     Comment: Pt was exposed to 2nd hand smoke at work years ago.  . Alcohol Use: No   OB History   Grav Para Term Preterm Abortions TAB SAB Ect Mult Living   3 3 3       3      Review of Systems  Genitourinary: Positive for dysuria and hematuria.  All other systems reviewed and are negative.    Allergies  Latex and Morphine and related  Home Medications   Current Outpatient Rx  Name  Route  Sig  Dispense  Refill  . albuterol (PROVENTIL HFA;VENTOLIN HFA) 108 (90 BASE) MCG/ACT inhaler   Inhalation   Inhale 2 puffs into the lungs every 6 (six) hours as needed. for asthma/shortness of breath   1 Inhaler   5   . amLODipine (NORVASC) 10 MG tablet      TAKE 1 TABLET (10 MG TOTAL) BY MOUTH DAILY.   31 tablet   6   . budesonide (PULMICORT) 0.5 MG/2ML nebulizer solution      1 vial twice a day Dx 493.90   120 mL   3   . clonazePAM (KLONOPIN) 0.5 MG tablet   Oral   Take 0.5 mg by mouth 2 (two) times daily as needed.         [name]  cyclobenzaprine (FLEXERIL) 10 MG tablet   Oral   Take 10 mg by mouth 3 (three) times daily as needed. For muscle spasms         . EXPIRED: ferrous sulfate 325 (65 FE) MG tablet   Oral   Take 1 tablet (325 mg total) by mouth daily with breakfast.   30 tablet   1   . FLUoxetine (PROZAC) 20 MG tablet   Oral   Take 20 mg by mouth daily.         [name] EXPIRED: fluticasone (FLONASE) 50 MCG/ACT nasal spray   Nasal   Place 2 sprays into the nose 2 (two) times daily.   16 g   12   . EXPIRED: furosemide (LASIX) 40 MG tablet   Oral   Take 1 tablet (40 mg total) by mouth daily.   10 tablet   1   . ipratropium-albuterol (DUONEB) 0.5-2.5 (3) MG/3ML SOLN      USE 1 VIAL IN NEBULIZER EVERY 4 HOURS AS NEEDED   540 mL   5   . EXPIRED: loratadine (CLARITIN) 10 MG tablet   Oral   Take 1 tablet (10 mg total) by mouth daily.   31 tablet   12   . EXPIRED: metoprolol tartrate (LOPRESSOR) 25 MG tablet   Oral   Take 1  tablet (25 mg total) by mouth 2 (two) times daily.   360 tablet   6   . montelukast (SINGULAIR) 10 MG tablet      TAKE 1 TABLET (10 MG TOTAL) BY MOUTH AT BEDTIME.   30 tablet   5   . nystatin cream (MYCOSTATIN)      Apply to affected area 2 times daily   15 g   0   . Oxycodone HCl 10 MG TABS   Oral   Take 10 mg by mouth 3 (three) times daily.         . pantoprazole (PROTONIX) 40 MG tablet      TAKE 1 TABLET (40 MG TOTAL) BY MOUTH DAILY.   31 tablet   6   . EXPIRED: potassium chloride SA (K-DUR,KLOR-CON) 20 MEQ tablet   Oral   Take 1 tablet (20 mEq total) by mouth daily.   10 tablet   1   . predniSONE (DELTASONE) 5 MG tablet      Use as directed   45 tablet   3   . pregabalin (LYRICA) 75 MG capsule   Oral   Take 75 mg by mouth 2 (two) times daily.         . Prenatal Vit-Fe Fumarate-FA (PRENATAL MULTIVITAMIN) TABS   Oral   Take 1 tablet by mouth every morning.         [name] PULMICORT 0.5 MG/2ML nebulizer solution      USE 1 VIAL IN NEBULIZER TWICE A DAY   120 mL   3     PROFILE   . rizatriptan (MAXALT) 10 MG tablet   Oral   Take 10 mg by mouth as needed. May repeat in 2 hours if needed         . Topiramate (TOPAMAX PO)   Oral   Take by mouth as needed.         . zolpidem (AMBIEN) 5 MG tablet   Oral   Take 5 mg by mouth at bedtime as needed.          BP 130/74  Pulse 99  Temp(Src) 98 F (36.7 C) (Oral)  Resp 16  Ht 5\' 2"  (1.575 m)  Wt 260 lb (117.935 kg)  BMI 47.54 kg/m2  SpO2 100%  LMP 02/02/2013 Physical Exam  Nursing note and vitals reviewed. Constitutional: She is oriented to person, place, and time. She appears well-developed and well-nourished. No distress.  HENT:  Head: Normocephalic and atraumatic.  Eyes: Conjunctivae are normal.  Neck: Normal range of motion.  Cardiovascular: Normal rate and regular rhythm.  Exam reveals no gallop and no friction rub.   No murmur heard. Pulmonary/Chest: Effort normal and breath sounds  normal. She has no wheezes. She has no rales. She exhibits no tenderness.  Abdominal: Soft. She exhibits no distension. There is tenderness. There is no rebound and no guarding.  Suprapubic tenderness to palpation. No other focal tenderness or peritoneal signs.   Genitourinary:  No CVA tenderness.   Musculoskeletal: Normal range of motion.  Neurological: She is alert and oriented to person, place, and time. Coordination normal.  Speech is goal-oriented. Moves limbs without ataxia.   Skin: Skin is warm and dry.  Psychiatric: She has a normal mood and affect. Her behavior is normal.    ED Course  Procedures (including critical care time) Labs Review Labs Reviewed  URINALYSIS, ROUTINE W REFLEX MICROSCOPIC - Abnormal; Notable for the following:    APPearance CLOUDY (*)    Hgb urine dipstick LARGE (*)    Protein, ur >300 (*)    Leukocytes, UA SMALL (*)    All other components within normal limits  CBC WITH DIFFERENTIAL - Abnormal; Notable for the following:    WBC 13.6 (*)    Hemoglobin 10.9 (*)    HCT 35.2 (*)    MCV 75.1 (*)    MCH 23.2 (*)    RDW 17.0 (*)    Neutro Abs 10.1 (*)    All other components within normal limits  BASIC METABOLIC PANEL - Abnormal; Notable for the following:    Potassium 3.4 (*)    Glucose, Bld 103 (*)    All other components within normal limits  URINE MICROSCOPIC-ADD ON - Abnormal; Notable for the following:    Bacteria, UA FEW (*)    All other components within normal limits  URINE CULTURE  PREGNANCY, URINE   Imaging Review Ct Abdomen Pelvis Wo Contrast  02/21/2013   CLINICAL DATA:  Hematuria, dysuria, interval T2 finish urination  EXAM: CT ABDOMEN AND PELVIS WITHOUT CONTRAST  TECHNIQUE: Multidetector CT imaging of the abdomen and pelvis was performed following the standard protocol without intravenous contrast.  COMPARISON:  None.  FINDINGS: The lung bases are clear.  There is a 4 mm right pelvic calcification likely representing a phlebolith and  less likely a distal ureteral calculus. There is no other renal, ureteral or bladder calculus. No obstructive uropathy. No perinephric stranding is seen. The kidneys are symmetric in size without evidence for exophytic mass. The bladder is unremarkable. There is a is 4 mm right pelvic phleboliths.  The liver demonstrates no focal abnormality. The gallbladder is unremarkable. The spleen demonstrates no focal abnormality. The adrenal glands and pancreas are normal.  The unopacified stomach, duodenum, small intestine and large intestine are unremarkable, but evaluation is limited by lack of oral contrast. There is no pneumoperitoneum, pneumatosis, or portal venous gas. There is no abdominal or pelvic free fluid. There is no lymphadenopathy.  The abdominal aorta is normal in caliber.  The osseous structures are unremarkable.  IMPRESSION: There is a 4 mm right pelvic calcification likely representing a phlebolith and  less likely a distal ureteral calculus. There is no other urolithiasis or obstructive uropathy.   Electronically Signed   By: Elige Ko   On: 02/21/2013 17:38    EKG Interpretation   None       MDM   1. UTI (urinary tract infection)     6:05 PM Labs show elevated WBC. Patient urine shows UTI with copious blood. CT shows no kidney stone. Patient will have IV Rocephin and be discharged with instructions to follow up with Urologist. Vitals stable and patient afebrile.     Emilia Beck, PA-C 02/21/13 1922

## 2013-02-21 NOTE — ED Notes (Signed)
Reports onset of dysuria, hematuria, inability to finish urination this morning.

## 2013-02-23 LAB — URINE CULTURE

## 2013-03-02 ENCOUNTER — Other Ambulatory Visit (HOSPITAL_COMMUNITY): Payer: Self-pay | Admitting: Otolaryngology

## 2013-03-02 DIAGNOSIS — R599 Enlarged lymph nodes, unspecified: Secondary | ICD-10-CM

## 2013-03-13 ENCOUNTER — Other Ambulatory Visit (HOSPITAL_COMMUNITY): Payer: Self-pay | Admitting: Radiology

## 2013-03-17 ENCOUNTER — Encounter (HOSPITAL_COMMUNITY): Payer: Self-pay | Admitting: Pharmacy Technician

## 2013-03-19 ENCOUNTER — Other Ambulatory Visit: Payer: Self-pay | Admitting: Radiology

## 2013-03-20 ENCOUNTER — Other Ambulatory Visit (HOSPITAL_COMMUNITY): Payer: Self-pay | Admitting: Otolaryngology

## 2013-03-20 ENCOUNTER — Ambulatory Visit (HOSPITAL_COMMUNITY)
Admission: RE | Admit: 2013-03-20 | Discharge: 2013-03-20 | Disposition: A | Discharge status: Home / Self Care | Payer: Medicaid Other | Source: Ambulatory Visit | Attending: Otolaryngology | Admitting: Otolaryngology

## 2013-03-20 ENCOUNTER — Encounter (HOSPITAL_COMMUNITY): Payer: Self-pay

## 2013-03-20 DIAGNOSIS — D509 Iron deficiency anemia, unspecified: Secondary | ICD-10-CM | POA: Insufficient documentation

## 2013-03-20 DIAGNOSIS — J45909 Unspecified asthma, uncomplicated: Secondary | ICD-10-CM | POA: Insufficient documentation

## 2013-03-20 DIAGNOSIS — R599 Enlarged lymph nodes, unspecified: Secondary | ICD-10-CM

## 2013-03-20 DIAGNOSIS — G4730 Sleep apnea, unspecified: Secondary | ICD-10-CM | POA: Insufficient documentation

## 2013-03-20 DIAGNOSIS — I10 Essential (primary) hypertension: Secondary | ICD-10-CM | POA: Insufficient documentation

## 2013-03-20 DIAGNOSIS — Z6841 Body Mass Index (BMI) 40.0 and over, adult: Secondary | ICD-10-CM | POA: Insufficient documentation

## 2013-03-20 DIAGNOSIS — IMO0002 Reserved for concepts with insufficient information to code with codable children: Secondary | ICD-10-CM | POA: Insufficient documentation

## 2013-03-20 DIAGNOSIS — F329 Major depressive disorder, single episode, unspecified: Secondary | ICD-10-CM | POA: Insufficient documentation

## 2013-03-20 DIAGNOSIS — Z9981 Dependence on supplemental oxygen: Secondary | ICD-10-CM | POA: Insufficient documentation

## 2013-03-20 DIAGNOSIS — F3289 Other specified depressive episodes: Secondary | ICD-10-CM | POA: Insufficient documentation

## 2013-03-20 DIAGNOSIS — IMO0001 Reserved for inherently not codable concepts without codable children: Secondary | ICD-10-CM | POA: Insufficient documentation

## 2013-03-20 DIAGNOSIS — Z01812 Encounter for preprocedural laboratory examination: Secondary | ICD-10-CM | POA: Insufficient documentation

## 2013-03-20 DIAGNOSIS — F411 Generalized anxiety disorder: Secondary | ICD-10-CM | POA: Insufficient documentation

## 2013-03-20 DIAGNOSIS — G43909 Migraine, unspecified, not intractable, without status migrainosus: Secondary | ICD-10-CM | POA: Insufficient documentation

## 2013-03-20 LAB — CBC
Hemoglobin: 10.7 g/dL — ABNORMAL LOW (ref 12.0–15.0)
MCH: 23.1 pg — ABNORMAL LOW (ref 26.0–34.0)
MCHC: 31.5 g/dL (ref 30.0–36.0)
Platelets: 364 10*3/uL (ref 150–400)
RBC: 4.63 MIL/uL (ref 3.87–5.11)

## 2013-03-20 LAB — PROTIME-INR
INR: 1.05 (ref 0.00–1.49)
Prothrombin Time: 13.5 seconds (ref 11.6–15.2)

## 2013-03-20 LAB — APTT: aPTT: 28 seconds (ref 24–37)

## 2013-03-20 MED ORDER — FENTANYL CITRATE 0.05 MG/ML IJ SOLN
INTRAMUSCULAR | Status: AC
  Filled 2013-03-20: qty 4

## 2013-03-20 MED ORDER — FENTANYL CITRATE 0.05 MG/ML IJ SOLN
INTRAMUSCULAR | Status: AC | PRN
  Administered 2013-03-20: 50 ug via INTRAVENOUS

## 2013-03-20 MED ORDER — MIDAZOLAM HCL 2 MG/2ML IJ SOLN
INTRAMUSCULAR | Status: AC | PRN
  Administered 2013-03-20: 2 mg via INTRAVENOUS

## 2013-03-20 MED ORDER — SODIUM CHLORIDE 0.9 % IV SOLN
Freq: Once | INTRAVENOUS | Status: AC
  Administered 2013-03-20: 10:00:00 via INTRAVENOUS

## 2013-03-20 MED ORDER — MIDAZOLAM HCL 2 MG/2ML IJ SOLN
INTRAMUSCULAR | Status: AC
  Filled 2013-03-20: qty 6

## 2013-03-20 NOTE — H&P (Signed)
Cheyenne Arias is an 37 y.o. female.   Chief Complaint: scheduled today for L parotid lymph nodes biopsy (poss core biopsy) Pt has continued to have intermittent pain and swelling in L parotid are x over 1 yr Parotid lymph node was biopsied 10/2012- insufficient Continues with pain New US neck was performed 02/21/13 and shows enlargement of nodes Now for new bx and possible core sample Work up with Dr Emeline Darling so far has been negative  HPI: obese; HTN; migraine; OSA- home O2; asthma  Past Medical History  Diagnosis Date  . Hypertension   . Migraine   . Anxiety   . Abnormal Pap smear   . Anemia   . Fibroids   . Fibromyalgia     degenerative disc disease  . Depression   . Sleep apnea   . Shortness of breath   . On home oxygen therapy     2 liters [place]  . PONV (postoperative nausea and vomiting)   . Low iron     pt states low iron-for Feraheme today in [facility]  . Morbid obesity with BMI of 45.0-49.9, adult   . Asthma     recent admission with exacerbation of asthma 09/15/11  . Iron deficiency anemia 04/24/2012    Past Surgical History  Procedure Laterality Date  . Wisdom tooth extraction    . Mandible surgery  2008  . Carpal tunnel release    . Cesarean section      1994, 1996   . Cesarean section  11/28/2011    Procedure: CESAREAN SECTION;  Surgeon: Adam Phenix, MD;  Location: [facility];  Service: Obstetrics;  Laterality: N/A;    Family History  Problem Relation Age of Onset  . Sickle cell trait Maternal Aunt   . Sickle cell anemia Other    Social History:  reports that she has never smoked. She has never used smokeless tobacco. She reports that she does not drink alcohol or use illicit drugs.  Allergies:  Allergies  Allergen Reactions  . Latex Itching and Swelling    Blisters  . Morphine And Related Itching     (Not in a hospital admission)  No results found for this or any previous visit (from the past 48 hour(s)). No results found.  Review of Systems   Constitutional: Negative for fever and weight loss.  Respiratory: Negative for cough and sputum production.   Gastrointestinal: Negative for nausea, vomiting and abdominal pain.  Musculoskeletal: Positive for neck pain.  Neurological: Negative for dizziness and headaches.    Pulse 104, temperature 98.5 F (36.9 C), temperature source Oral, resp. rate 18, height 5\' 2"  (1.575 m), weight 260 lb (117.935 kg), last menstrual period 02/02/2013, SpO2 100.00%. Physical Exam  Constitutional: She is oriented to person, place, and time.  Cardiovascular: Normal rate, regular rhythm and normal heart sounds.   No murmur heard. Respiratory: Effort normal and breath sounds normal. She has no wheezes.  GI: Soft. Bowel sounds are normal. There is no tenderness.  Musculoskeletal: Normal range of motion.  Neurological: She is alert and oriented to person, place, and time.  Skin: Skin is warm and dry.  Psychiatric: She has a normal mood and affect. Her behavior is normal. Judgment and thought content normal.     Assessment/Plan L parotid lymph nodes enlarging Prev bx 10/2012- insuff Scheduled now for new bx- possibly core sample Pt aware of procedure benefits and risks and agreeable to proceed Consent signed and  in chart  [name] A 03/20/2013, 9:56  AM    

## 2013-03-20 NOTE — Procedures (Signed)
[place] BX left parotid adenopathy 18g core bxs No comp Stable Path pending Full report in pacs

## 2013-03-31 ENCOUNTER — Ambulatory Visit: Payer: Medicaid Other | Admitting: Pulmonary Disease

## 2013-04-01 ENCOUNTER — Ambulatory Visit: Payer: Medicaid Other | Admitting: Pulmonary Disease

## 2013-04-21 ENCOUNTER — Telehealth: Payer: Self-pay | Admitting: Pulmonary Disease

## 2013-04-21 NOTE — Telephone Encounter (Signed)
[email or web address]  

## 2013-04-22 NOTE — Telephone Encounter (Signed)
Please see if she can come in early for an RN visit to assess home oxygen needs.  Please do resting and ambulatory oximetry on room air.  If he oxygen saturation is below 88%, then can send order for portable oxygen concentrator.

## 2013-04-22 NOTE — Telephone Encounter (Signed)
Pt called back and she is aware that she will need to come in for visit to assess home oxygen needs.  Will need resting and ambulatory oximetry on room air.  If she drops below 88%, the ok to send order for portable oxygen concentrator.  Pt is aware of appt on 04/24/2013.

## 2013-04-22 NOTE — Telephone Encounter (Signed)
Per last OV note: She is to continue with oxygen at night, but does not need supplemental oxygen during the day. What is pt needing? [id number]. Athelstan Bing, CMA

## 2013-04-22 NOTE — Telephone Encounter (Signed)
Returning call.Cheyenne Arias ° °

## 2013-04-22 NOTE — Telephone Encounter (Signed)
lmomtcb x1 for pt 

## 2013-04-22 NOTE — Telephone Encounter (Signed)
According to last OV pt does not need supplemental O2 during the day, to continue at night.  Pt called back. She reports she has been using the O2 during the day-depends on how she is feeling that day or how much walking she does. Pt is requesting an order for POC. Please advise Dr. Halford Chessman thanks  Pending appt 05/20/13

## 2013-04-24 ENCOUNTER — Encounter: Payer: Self-pay | Admitting: Pulmonary Disease

## 2013-04-24 ENCOUNTER — Ambulatory Visit: Payer: Medicaid Other

## 2013-05-07 NOTE — Telephone Encounter (Signed)
She really needs to get these from her primary care md

## 2013-05-20 ENCOUNTER — Ambulatory Visit: Payer: Medicaid Other | Admitting: Pulmonary Disease

## 2013-05-21 ENCOUNTER — Encounter: Payer: Self-pay | Admitting: Pulmonary Disease

## 2013-05-27 ENCOUNTER — Encounter (HOSPITAL_BASED_OUTPATIENT_CLINIC_OR_DEPARTMENT_OTHER): Payer: Self-pay | Admitting: Emergency Medicine

## 2013-05-27 ENCOUNTER — Emergency Department (HOSPITAL_BASED_OUTPATIENT_CLINIC_OR_DEPARTMENT_OTHER): Payer: Medicaid Other

## 2013-05-27 ENCOUNTER — Emergency Department (HOSPITAL_BASED_OUTPATIENT_CLINIC_OR_DEPARTMENT_OTHER)
Admission: EM | Admit: 2013-05-27 | Discharge: 2013-05-28 | Disposition: A | Discharge status: Home / Self Care | Payer: Medicaid Other | Attending: Emergency Medicine | Admitting: Emergency Medicine

## 2013-05-27 DIAGNOSIS — Z91040 Latex allergy status: Secondary | ICD-10-CM | POA: Insufficient documentation

## 2013-05-27 DIAGNOSIS — R110 Nausea: Secondary | ICD-10-CM | POA: Insufficient documentation

## 2013-05-27 DIAGNOSIS — J45909 Unspecified asthma, uncomplicated: Secondary | ICD-10-CM | POA: Insufficient documentation

## 2013-05-27 DIAGNOSIS — E876 Hypokalemia: Secondary | ICD-10-CM | POA: Insufficient documentation

## 2013-05-27 DIAGNOSIS — R300 Dysuria: Secondary | ICD-10-CM | POA: Insufficient documentation

## 2013-05-27 DIAGNOSIS — F329 Major depressive disorder, single episode, unspecified: Secondary | ICD-10-CM | POA: Insufficient documentation

## 2013-05-27 DIAGNOSIS — I10 Essential (primary) hypertension: Secondary | ICD-10-CM | POA: Insufficient documentation

## 2013-05-27 DIAGNOSIS — G43909 Migraine, unspecified, not intractable, without status migrainosus: Secondary | ICD-10-CM | POA: Insufficient documentation

## 2013-05-27 DIAGNOSIS — Z3202 Encounter for pregnancy test, result negative: Secondary | ICD-10-CM | POA: Insufficient documentation

## 2013-05-27 DIAGNOSIS — Z9981 Dependence on supplemental oxygen: Secondary | ICD-10-CM | POA: Insufficient documentation

## 2013-05-27 DIAGNOSIS — F3289 Other specified depressive episodes: Secondary | ICD-10-CM | POA: Insufficient documentation

## 2013-05-27 DIAGNOSIS — IMO0001 Reserved for inherently not codable concepts without codable children: Secondary | ICD-10-CM | POA: Insufficient documentation

## 2013-05-27 DIAGNOSIS — F411 Generalized anxiety disorder: Secondary | ICD-10-CM | POA: Insufficient documentation

## 2013-05-27 DIAGNOSIS — D649 Anemia, unspecified: Secondary | ICD-10-CM | POA: Insufficient documentation

## 2013-05-27 DIAGNOSIS — Z79899 Other long term (current) drug therapy: Secondary | ICD-10-CM | POA: Insufficient documentation

## 2013-05-27 DIAGNOSIS — R109 Unspecified abdominal pain: Secondary | ICD-10-CM

## 2013-05-27 DIAGNOSIS — IMO0002 Reserved for concepts with insufficient information to code with codable children: Secondary | ICD-10-CM | POA: Insufficient documentation

## 2013-05-27 DIAGNOSIS — M549 Dorsalgia, unspecified: Secondary | ICD-10-CM | POA: Insufficient documentation

## 2013-05-27 LAB — CBC WITH DIFFERENTIAL/PLATELET
Basophils Absolute: 0 10*3/uL (ref 0.0–0.1)
Basophils Relative: 0 % (ref 0–1)
EOS PCT: 4 % (ref 0–5)
Eosinophils Absolute: 0.4 10*3/uL (ref 0.0–0.7)
HEMATOCRIT: 34.7 % — AB (ref 36.0–46.0)
Hemoglobin: 10.5 g/dL — ABNORMAL LOW (ref 12.0–15.0)
LYMPHS PCT: 20 % (ref 12–46)
Lymphs Abs: 2.1 10*3/uL (ref 0.7–4.0)
MCH: 22 pg — ABNORMAL LOW (ref 26.0–34.0)
MCHC: 30.3 g/dL (ref 30.0–36.0)
MCV: 72.6 fL — ABNORMAL LOW (ref 78.0–100.0)
MONOS PCT: 6 % (ref 3–12)
Monocytes Absolute: 0.6 10*3/uL (ref 0.1–1.0)
NEUTROS ABS: 7.5 10*3/uL (ref 1.7–7.7)
Neutrophils Relative %: 70 % (ref 43–77)
Platelets: 386 10*3/uL (ref 150–400)
RBC: 4.78 MIL/uL (ref 3.87–5.11)
RDW: 16.9 % — ABNORMAL HIGH (ref 11.5–15.5)
WBC: 10.6 10*3/uL — AB (ref 4.0–10.5)

## 2013-05-27 LAB — LIPASE, BLOOD: LIPASE: 43 U/L (ref 11–59)

## 2013-05-27 LAB — URINALYSIS, ROUTINE W REFLEX MICROSCOPIC
Bilirubin Urine: NEGATIVE
Glucose, UA: NEGATIVE mg/dL
Hgb urine dipstick: NEGATIVE
Ketones, ur: NEGATIVE mg/dL
Leukocytes, UA: NEGATIVE
Nitrite: NEGATIVE
PROTEIN: NEGATIVE mg/dL
Specific Gravity, Urine: 1.022 (ref 1.005–1.030)
Urobilinogen, UA: 0.2 mg/dL (ref 0.0–1.0)
pH: 6 (ref 5.0–8.0)

## 2013-05-27 LAB — COMPREHENSIVE METABOLIC PANEL
ALK PHOS: 78 U/L (ref 39–117)
ALT: 17 U/L (ref 0–35)
AST: 14 U/L (ref 0–37)
Albumin: 3.7 g/dL (ref 3.5–5.2)
BILIRUBIN TOTAL: 0.2 mg/dL — AB (ref 0.3–1.2)
BUN: 10 mg/dL (ref 6–23)
CHLORIDE: 100 meq/L (ref 96–112)
CO2: 28 meq/L (ref 19–32)
CREATININE: 0.7 mg/dL (ref 0.50–1.10)
Calcium: 9 mg/dL (ref 8.4–10.5)
GFR calc Af Amer: >90 mL/min (ref >90)
GLUCOSE: 88 mg/dL (ref 70–99)
Potassium: 3.1 mEq/L — ABNORMAL LOW (ref 3.7–5.3)
Sodium: 139 mEq/L (ref 137–147)
Total Protein: 7.6 g/dL (ref 6.0–8.3)

## 2013-05-27 LAB — WET PREP, GENITAL
Trich, Wet Prep: NONE SEEN
Yeast Wet Prep HPF POC: NONE SEEN

## 2013-05-27 LAB — PREGNANCY, URINE: Preg Test, Ur: NEGATIVE

## 2013-05-27 MED ORDER — IOHEXOL 300 MG/ML  SOLN
100.0000 mL | Freq: Once | INTRAMUSCULAR | Status: AC | PRN
  Administered 2013-05-27: 100 mL via INTRAVENOUS

## 2013-05-27 MED ORDER — POTASSIUM CHLORIDE CRYS ER 20 MEQ PO TBCR
40.0000 meq | EXTENDED_RELEASE_TABLET | Freq: Once | ORAL | Status: AC
  Administered 2013-05-27: 40 meq via ORAL
  Filled 2013-05-27: qty 2

## 2013-05-27 MED ORDER — IOHEXOL 300 MG/ML  SOLN
50.0000 mL | Freq: Once | INTRAMUSCULAR | Status: AC | PRN
  Administered 2013-05-27: 50 mL via ORAL

## 2013-05-27 MED ORDER — HYDROMORPHONE HCL PF 1 MG/ML IJ SOLN
1.0000 mg | Freq: Once | INTRAMUSCULAR | Status: AC
  Administered 2013-05-27: 1 mg via INTRAVENOUS
  Filled 2013-05-27: qty 1

## 2013-05-27 NOTE — ED Provider Notes (Signed)
CSN: [id number]     Arrival date & time 05/27/13  1841 History   First MD Initiated Contact with Patient 05/27/13 1927     Chief Complaint  Patient presents with  . Abdominal Pain    HPI  Cheyenne Arias is a 38 y.o. female with a PMH of HTN, migraine, anxiety, anemia, fibroids, fibromyalgia, depression, sleep apnea, SOB, home O2, asthma who presents to the ED for evaluation of abdominal pain.  History was provided by the patient.  Patient developed gradually worsening abdominal pain yesterday afternoon.  Her abdominal pain is located in the periumbilical region with radiation towards her lower abdomen diffusely and lower back equal bilaterally. Her pain is intermittent with fluctuations in severity. Pain is described as a cramping sensation. She tried taking her flexeril, lyrica, and percocet with no relief. She denies similar pain in the past. Associated symptoms include difficulty with urination and nausea. No emesis, diarrhea, constipation, dysuria, hematuria, vaginal bleeding/dischare. Patient has chronic SOB with no acute changes. Patient on 2L [place] for "bad asthma." No coughing, chest pain. No fever or chills, change in appetite/activity, leg edema, headache, dizziness or lightheadedness. Previous abdominal surgeries include c-section's.    Past Medical History  Diagnosis Date  . Hypertension   . Migraine   . Anxiety   . Abnormal Pap smear   . Anemia   . Fibroids   . Fibromyalgia     degenerative disc disease  . Depression   . Sleep apnea   . Shortness of breath   . On home oxygen therapy     2 liters [place]  . PONV (postoperative nausea and vomiting)   . Low iron     pt states low iron-for Feraheme today in [facility]  . Morbid obesity with BMI of 45.0-49.9, adult   . Asthma     recent admission with exacerbation of asthma 09/15/11  . Iron deficiency anemia 04/24/2012   Past Surgical History  Procedure Laterality Date  . Wisdom tooth extraction    . Mandible surgery  2008  . Carpal  tunnel release    . Cesarean section      1994, 1996   . Cesarean section  11/28/2011    Procedure: CESAREAN SECTION;  Surgeon: Woodroe Mode, MD;  Location: [facility];  Service: Obstetrics;  Laterality: N/A;   Family History  Problem Relation Age of Onset  . Sickle cell trait Maternal Aunt   . Sickle cell anemia Other    History  Substance Use Topics  . Smoking status: Never Smoker   . Smokeless tobacco: Never Used     Comment: Pt was exposed to 2nd hand smoke at work years ago.  . Alcohol Use: No   OB History   Grav Para Term Preterm Abortions TAB SAB Ect Mult Living   3 3 3       3      Review of Systems  Constitutional: Negative for fever, chills, diaphoresis, activity change, appetite change and fatigue.  HENT: Negative for congestion and rhinorrhea.   Respiratory: Positive for shortness of breath (chronic). Negative for cough and wheezing.   Cardiovascular: Negative for chest pain and leg swelling.  Gastrointestinal: Positive for nausea and abdominal pain. Negative for vomiting, diarrhea, constipation and blood in stool.  Genitourinary: Positive for difficulty urinating. Negative for dysuria, urgency, hematuria, decreased urine volume, vaginal bleeding, vaginal discharge and menstrual problem.  Musculoskeletal: Positive for back pain. Negative for myalgias.  Skin: Negative for wound.  Neurological: Negative for  dizziness, weakness, light-headedness and headaches.    Allergies  Latex and Morphine and related  Home Medications   Current Outpatient Rx  Name  Route  Sig  Dispense  Refill  . albuterol (PROVENTIL HFA;VENTOLIN HFA) 108 (90 BASE) MCG/ACT inhaler   Inhalation   Inhale 2 puffs into the lungs every 6 (six) hours as needed. for asthma/shortness of breath   1 Inhaler   5   . amLODipine (NORVASC) 10 MG tablet   Oral   Take 10 mg by mouth daily.         . budesonide (PULMICORT) 0.5 MG/2ML nebulizer solution      1 vial twice a day Dx 493.90   120 mL   3    . budesonide (PULMICORT) 0.5 MG/2ML nebulizer solution   Nebulization   Take 0.5 mg by nebulization 2 (two) times daily.         . clonazePAM (KLONOPIN) 0.5 MG tablet   Oral   Take 0.5 mg by mouth 2 (two) times daily as needed for anxiety.          . cyclobenzaprine (FLEXERIL) 10 MG tablet   Oral   Take 10 mg by mouth 3 (three) times daily as needed. For muscle spasms         . FLUoxetine (PROZAC) 20 MG tablet   Oral   Take 20 mg by mouth daily.         [name] EXPIRED: fluticasone (FLONASE) 50 MCG/ACT nasal spray   Nasal   Place 2 sprays into the nose 2 (two) times daily.   16 g   12   . EXPIRED: furosemide (LASIX) 40 MG tablet   Oral   Take 1 tablet (40 mg total) by mouth daily.   10 tablet   1   . ibuprofen (ADVIL,MOTRIN) 800 MG tablet   Oral   Take 800 mg by mouth 3 (three) times daily.         [name] ipratropium-albuterol (DUONEB) 0.5-2.5 (3) MG/3ML SOLN   Nebulization   Take 3 mLs by nebulization every 4 (four) hours as needed (for wheezing).         . EXPIRED: loratadine (CLARITIN) 10 MG tablet   Oral   Take 1 tablet (10 mg total) by mouth daily.   31 tablet   12   . EXPIRED: metoprolol tartrate (LOPRESSOR) 25 MG tablet   Oral   Take 1 tablet (25 mg total) by mouth 2 (two) times daily.   360 tablet   6   . montelukast (SINGULAIR) 10 MG tablet   Oral   Take 10 mg by mouth at bedtime.         . Multiple Vitamins-Minerals (MULTIVITAMIN WITH MINERALS) tablet   Oral   Take 1 tablet by mouth daily.         . Oxycodone HCl 10 MG TABS   Oral   Take 10 mg by mouth 3 (three) times daily.         Donell Sievert [place]   Other   by Other route. Uses 2 Liters of Oxygen Continuously         . pantoprazole (PROTONIX) 40 MG tablet   Oral   Take 40 mg by mouth daily.         [name] EXPIRED: potassium chloride SA (K-DUR,KLOR-CON) 20 MEQ tablet   Oral   Take 1 tablet (20 mEq total) by mouth daily.   10 tablet   1   . predniSONE (DELTASONE)  10 MG  tablet   Oral   Take 10 mg by mouth daily with breakfast.         . pregabalin (LYRICA) 75 MG capsule   Oral   Take 75 mg by mouth 2 (two) times daily.         . rizatriptan (MAXALT) 10 MG tablet   Oral   Take 10 mg by mouth daily as needed. May repeat in 2 hours if needed         . sennosides-docusate sodium (SENOKOT-S) 8.6-50 MG tablet   Oral   Take 2 tablets by mouth daily.         [name] topiramate (TOPAMAX) 25 MG tablet   Oral   Take 75 mg by mouth 2 (two) times daily.         [name] zolpidem (AMBIEN) 5 MG tablet   Oral   Take 5 mg by mouth at bedtime as needed for sleep.           BP 153/86  Pulse 108  Resp 20  Ht 5\' 2"  (1.575 m)  Wt 262 lb (118.842 kg)  BMI 47.91 kg/m2  SpO2 98%  LMP 04/27/2013  Filed Vitals:   05/27/13 1851 05/27/13 2151 05/27/13 2301  BP: 153/86 114/71 129/76  Pulse: 108 101 100  Temp:   98.2 F (36.8 C)  TempSrc:   Oral  Resp: 20 18 20   Height: 5\' 2"  (1.575 m)    Weight: 262 lb (118.842 kg)    SpO2: 98% 100% 100%    Physical Exam  Nursing note and vitals reviewed. Constitutional: She is oriented to person, place, and time. She appears well-developed and well-nourished. No distress.  HENT:  Head: Normocephalic and atraumatic.  Right Ear: External ear normal.  Left Ear: External ear normal.  Nose: Nose normal.  Mouth/Throat: Oropharynx is clear and moist. No oropharyngeal exudate.  Eyes: Conjunctivae are normal. Pupils are equal, round, and reactive to light. Right eye exhibits no discharge. Left eye exhibits no discharge.  Neck: Normal range of motion. Neck supple.  Cardiovascular: Normal rate, regular rhythm, normal heart sounds and intact distal pulses.  Exam reveals no gallop and no friction rub.   No murmur heard. Pulmonary/Chest: Effort normal and breath sounds normal. No respiratory distress. She has no wheezes. She has no rales. She exhibits no tenderness.  Abdominal: Soft. Bowel sounds are normal. She exhibits no  distension and no mass. There is tenderness. There is no rebound and no guarding.  Diffuse tenderness to palpation throughout the lower abdomen   Musculoskeletal: Normal range of motion. She exhibits no edema and no tenderness.  No LE edema or calf tenderness bilaterally  Neurological: She is alert and oriented to person, place, and time.  Skin: Skin is warm and dry. She is not diaphoretic.    ED Course  Procedures (including critical care time) Labs Review Labs Reviewed  URINALYSIS, ROUTINE W REFLEX MICROSCOPIC  PREGNANCY, URINE   Imaging Review No results found.  EKG Interpretation   None      Results for orders placed during the hospital encounter of 05/27/13  WET PREP, GENITAL      Result Value Ref Range   Yeast Wet Prep HPF POC NONE SEEN  NONE SEEN   Trich, Wet Prep NONE SEEN  NONE SEEN   Clue Cells Wet Prep HPF POC FEW (*) NONE SEEN   WBC, Wet Prep HPF POC FEW (*) NONE SEEN  GC/CHLAMYDIA PROBE AMP  Result Value Ref Range   CT Probe RNA NEGATIVE  NEGATIVE   GC Probe RNA NEGATIVE  NEGATIVE  URINALYSIS, ROUTINE W REFLEX MICROSCOPIC      Result Value Ref Range   Color, Urine YELLOW  YELLOW   APPearance CLEAR  CLEAR   Specific Gravity, Urine 1.022  1.005 - 1.030   pH 6.0  5.0 - 8.0   Glucose, UA NEGATIVE  NEGATIVE mg/dL   Hgb urine dipstick NEGATIVE  NEGATIVE   Bilirubin Urine NEGATIVE  NEGATIVE   Ketones, ur NEGATIVE  NEGATIVE mg/dL   Protein, ur NEGATIVE  NEGATIVE mg/dL   Urobilinogen, UA 0.2  0.0 - 1.0 mg/dL   Nitrite NEGATIVE  NEGATIVE   Leukocytes, UA NEGATIVE  NEGATIVE  PREGNANCY, URINE      Result Value Ref Range   Preg Test, Ur NEGATIVE  NEGATIVE  CBC WITH DIFFERENTIAL      Result Value Ref Range   WBC 10.6 (*) 4.0 - 10.5 K/uL   RBC 4.78  3.87 - 5.11 MIL/uL   Hemoglobin 10.5 (*) 12.0 - 15.0 g/dL   HCT 34.7 (*) 36.0 - 46.0 %   MCV 72.6 (*) 78.0 - 100.0 fL   MCH 22.0 (*) 26.0 - 34.0 pg   MCHC 30.3  30.0 - 36.0 g/dL   RDW 16.9 (*) 11.5 - 15.5 %    Platelets 386  150 - 400 K/uL   Neutrophils Relative % 70  43 - 77 %   Lymphocytes Relative 20  12 - 46 %   Monocytes Relative 6  3 - 12 %   Eosinophils Relative 4  0 - 5 %   Basophils Relative 0  0 - 1 %   Neutro Abs 7.5  1.7 - 7.7 K/uL   Lymphs Abs 2.1  0.7 - 4.0 K/uL   Monocytes Absolute 0.6  0.1 - 1.0 K/uL   Eosinophils Absolute 0.4  0.0 - 0.7 K/uL   Basophils Absolute 0.0  0.0 - 0.1 K/uL  COMPREHENSIVE METABOLIC PANEL      Result Value Ref Range   Sodium 139  137 - 147 mEq/L   Potassium 3.1 (*) 3.7 - 5.3 mEq/L   Chloride 100  96 - 112 mEq/L   CO2 28  19 - 32 mEq/L   Glucose, Bld 88  70 - 99 mg/dL   BUN 10  6 - 23 mg/dL   Creatinine, Ser 0.70  0.50 - 1.10 mg/dL   Calcium 9.0  8.4 - 10.5 mg/dL   Total Protein 7.6  6.0 - 8.3 g/dL   Albumin 3.7  3.5 - 5.2 g/dL   AST 14  0 - 37 U/L   ALT 17  0 - 35 U/L   Alkaline Phosphatase 78  39 - 117 U/L   Total Bilirubin 0.2 (*) 0.3 - 1.2 mg/dL   GFR calc non Af Amer >90  >90 mL/min   GFR calc Af Amer >90  >90 mL/min  LIPASE, BLOOD      Result Value Ref Range   Lipase 43  11 - 59 U/L    CT Abdomen Pelvis W Contrast (Final result)  Result time: 05/27/13 23:24:51    Final result by Rad Results In Interface (05/27/13 23:24:51)    Narrative:   CLINICAL DATA: Short of breath. Nausea. Abdominal pain.  EXAM: CT ABDOMEN AND PELVIS WITH CONTRAST  TECHNIQUE: Multidetector CT imaging of the abdomen and pelvis was performed using the standard protocol following bolus administration of intravenous  contrast.  CONTRAST: 70mL OMNIPAQUE IOHEXOL 300 MG/ML SOLN, 176mL OMNIPAQUE IOHEXOL 300 MG/ML SOLN  COMPARISON: CT ABD/PELV WO CM dated 02/21/2013  FINDINGS: Lung Bases: Subsegmental atelectasis.  Liver: Within normal limits. Normal variant Riedel lobe of the liver.  Spleen: Normal.  Gallbladder: Normal.  Common bile duct: Normal.  Pancreas: Normal.  Adrenal glands: Normal bilaterally.  Kidneys: Normal enhancement. No calculi. The  ureters appear within normal limits.  Stomach: Normal.  Small bowel: Normal. No evidence of obstruction. Single enlargement tear lymph node is present, measuring 16 mm short axis (image 42 series 2). This mesenteric lymph node is unchanged compared to 02/21/2013.  Colon: Normal appendix. Ascending colon appears normal. Distal colon is decompressed.  Pelvic Genitourinary: Normal physiologic appearance of the uterus and adnexa. 4 mm calcification adjacent to the right UVJ is unchanged, compatible with phleboliths rather than ureteral calculus.  Bones: No aggressive osseous lesions. Right sacroiliac joint degenerative disease. Likely developmental loss of height of the anterior aspect of L5, chronic.  Vasculature: Normal.  Body Wall: Grossly normal.  IMPRESSION: No acute abnormality.   Electronically Signed By: Dereck Ligas M.D. On: 05/27/2013 23:24         MDM   Cheyenne FRANCKOWIAK is a 38 y.o. female with a PMH of HTN, migraine, anxiety, anemia, fibroids, fibromyalgia, depression, sleep apnea, SOB, home O2, asthma who presents to the ED for evaluation of abdominal pain.  Rechecks  9:45 PM = Pelvic exam at bedside with RN. Minimal thin white discharge present in the vaginal vault. No CMT or adnexal tenderness. Repeat abdominal exam revealed increased tenderness in the RLQ as well as diffuse lower abdominal tenderness.  11:15 PM = Patient sleeping when I entered the room. States pain much better. Will try to ambulate to restroom to see if she is able to urinate.   11:30 PM = Patient able to urinate and ambulate without difficulty or ataxia.    Etiology of abdominal pain unclear. CT negative for an acute intraabdominal process. UA negative for UTI. Labs revealed mild hypokalemia, which was supplemented in the ED. Mild anemia at baseline. Labs otherwise unremarkable. Pelvic exam unremarkable. Patient had improvements in her abdominal pain throughout her ED visit. Patient  afebrile and non-toxic in appearance. Patient instructed to follow-up with her PCP. Return precautions, discharge instructions, and follow-up was discussed with the patient before discharge.    Discharge Medication List as of 05/27/2013 11:39 PM      Final impressions: 1. Abdominal pain   2. Hypokalemia   3. Anemia      Denman George            [place], [place] 05/29/13 [phone]

## 2013-05-27 NOTE — ED Notes (Signed)
Pt remains unable to void, will attempt to i/o cath

## 2013-05-27 NOTE — Discharge Instructions (Signed)
Follow-up with your doctor regarding your abdominal pain  Eat foods rich in potassium - see below  Return to the emergency department if you develop any changing/worsening condition, fever, repeated vomiting, inability to urinate, blood in your stool/vomit, feeling like you are going to pass out, or any other concerns (please read additional information regarding your condition below)    Abdominal Pain, Adult Many things can cause abdominal pain. Usually, abdominal pain is not caused by a disease and will improve without treatment. It can often be observed and treated at home. Your health care provider will do a physical exam and possibly order blood tests and X-rays to help determine the seriousness of your pain. However, in many cases, more time must pass before a clear cause of the pain can be found. Before that point, your health care provider may not know if you need more testing or further treatment. HOME CARE INSTRUCTIONS  Monitor your abdominal pain for any changes. The following actions may help to alleviate any discomfort you are experiencing:  Only take over-the-counter or prescription medicines as directed by your health care provider.  Do not take laxatives unless directed to do so by your health care provider.  Try a clear liquid diet (broth, tea, or water) as directed by your health care provider. Slowly move to a bland diet as tolerated. SEEK MEDICAL CARE IF:  You have unexplained abdominal pain.  You have abdominal pain associated with nausea or diarrhea.  You have pain when you urinate or have a bowel movement.  You experience abdominal pain that wakes you in the night.  You have abdominal pain that is worsened or improved by eating food.  You have abdominal pain that is worsened with eating fatty foods. SEEK IMMEDIATE MEDICAL CARE IF:   Your pain does not go away within 2 hours.  You have a fever.  You keep throwing up (vomiting).  Your pain is felt only in  portions of the abdomen, such as the right side or the left lower portion of the abdomen.  You pass bloody or black tarry stools. MAKE SURE YOU:  Understand these instructions.   Will watch your condition.   Will get help right away if you are not doing well or get worse.  Document Released: 01/10/2005 Document Revised: 01/21/2013 Document Reviewed: 12/10/2012 [facility] Patient Information 2014 [place].  Hypokalemia Hypokalemia means that the amount of potassium in the blood is lower than normal.Potassium is a chemical, called an electrolyte, that helps regulate the amount of fluid in the body. It also stimulates muscle contraction and helps nerves function properly.Most of the body's potassium is inside of cells, and only a very small amount is in the blood. Because the amount in the blood is so small, minor changes can be life-threatening. CAUSES  Antibiotics.  Diarrhea or vomiting.  Using laxatives too much, which can cause diarrhea.  Chronic kidney disease.  Water pills (diuretics).  Eating disorders (bulimia).  Low magnesium level.  Sweating a lot. SIGNS AND SYMPTOMS  Weakness.  Constipation.  Fatigue.  Muscle cramps.  Mental confusion.  Skipped heartbeats or irregular heartbeat (palpitations).  Tingling or numbness. DIAGNOSIS  Your health care provider can diagnose hypokalemia with blood tests. In addition to checking your potassium level, your health care provider may also check other lab tests. TREATMENT Hypokalemia can be treated with potassium supplements taken by mouth or adjustments in your current medicines. If your potassium level is very low, you may need to get potassium through  a vein (IV) and be monitored in the hospital. A diet high in potassium is also helpful. Foods high in potassium are:  Nuts, such as peanuts and pistachios.  Seeds, such as sunflower seeds and pumpkin seeds.  Peas, lentils, and lima beans.  Whole grain and  bran cereals and breads.  Fresh fruit and vegetables, such as apricots, avocado, bananas, cantaloupe, kiwi, oranges, tomatoes, asparagus, and potatoes.  Orange and tomato juices.  Red meats.  Fruit yogurt. HOME CARE INSTRUCTIONS  Take all medicines as prescribed by your health care provider.  Maintain a healthy diet by including nutritious food, such as fruits, vegetables, nuts, whole grains, and lean meats.  If you are taking a laxative, be sure to follow the directions on the label. SEEK MEDICAL CARE IF:  Your weakness gets worse.  You feel your heart pounding or racing.  You are vomiting or having diarrhea.  You are diabetic and having trouble keeping your blood glucose in the normal range. SEEK IMMEDIATE MEDICAL CARE IF:  You have chest pain, shortness of breath, or dizziness.  You are vomiting or having diarrhea for more than 2 days.  You faint. MAKE SURE YOU:   Understand these instructions.  Will watch your condition.  Will get help right away if you are not doing well or get worse. Document Released: 04/02/2005 Document Revised: 01/21/2013 Document Reviewed: 10/03/2012 [facility] Patient Information 2014 [place].  Potassium Content of Foods Potassium is a mineral found in many foods and drinks. It helps keep fluids and minerals balanced in your body and also affects how steadily your heart beats. The body needs potassium to control blood pressure and to keep the muscles and nervous system healthy. However, certain health conditions and medicine may require you to eat more or less potassium-rich foods and drinks. Your caregiver or dietitian will tell you how much potassium you should have each day. COMMON SERVING SIZES The list below tells you how big or small common portion sizes are:  1 oz.........4 stacked dice.  3 oz........Marland KitchenDeck of cards.  1 tsp.......Marland KitchenTip of little finger.  1 tbsp....Marland KitchenMarland KitchenThumb.  2 tbsp....Marland KitchenMarland KitchenGolf ball.   c..........Marland KitchenHalf of a  fist.  1 c...........Marland KitchenA fist. FOODS AND DRINKS HIGH IN POTASSIUM More than 200 mg of potassium per serving. A serving size is  c (120 mL or noted gram weight) unless otherwise stated. While all the items on this list are high in potassium, some items are higher in potassium than others. Fruits  Apricots (sliced), 83 g.  Apricots (dried halves), 3 oz / 24 g.  Avocado (cubed),  c / 50 g.  Banana (sliced), 75 g.  Cantaloupe (cubed), 80 g.  Dates (pitted), 5 whole / 35 g.  Figs (dried), 4 whole / 32 g.  Guava, c / 55 g.  Honeydew, 1 wedge / 85 g.  Kiwi (sliced), 90 g.  Nectarine, 1 small / 129 g.  Orange, 1 medium / 131 g.  Orange juice.  Pomegranate seeds, 87 g.  Pomegranate juice.  Prunes (pitted), 3 whole / 30 g.  Prune juice, 3 oz / 90 mL.  Seedless raisins, 3 tbsp / 27 g. Vegetables  Artichoke,  of a medium / 64 g.  Asparagus (boiled), 90 g.  Baked beans,  c / 63 g.  Bamboo shoots,  c / 38 g.  Beets (cooked slices), 85 g.  Broccoli (boiled), 78 g.  Brussels sprout (boiled), 78 g.  Butternut squash (baked), 103 g.  Chickpea (cooked), 82 g.  Nyoka Cowden  peas (cooked), 80 g.  Hubbard squash (baked cubes),  c / 68 g.  Kidney beans (cooked), 5 tbsp / 55 g.  Lima beans (cooked),  c / 43 g.  Navy beans (cooked),  c / 61 g.  Potato (baked), 61 g.  Potato (boiled), 78 g.  Pumpkin (boiled), 123 g.  Refried beans,  c / 79 g.  Spinach (cooked),  c / 45 g.  Split peas (cooked),  c / 65 g.  Sun-dried tomatoes, 2 tbsp / 7 g.  Sweet potato (baked),  c / 50 g.  Tomato (chopped or sliced), 90 g.  Tomato juice.  Tomato paste, 4 tsp / 21 g.  Tomato sauce,  c / 61 g.  Vegetable juice.  White mushrooms (cooked), 78 g.  Yam (cooked or baked),  c / 34 g.  Zucchini squash (boiled), 90 g. Other Foods and Drinks  Almonds (whole),  c / 36 g.  Cashews (oil roasted),  c / 32 g.  Chocolate milk.  Chocolate pudding, 142 g.  Clams  (steamed), 1.5 oz / 43 g.  Dark chocolate, 1.5 oz / 42 g.  Fish, 3 oz / 85 g.  King crab (steamed), 3 oz / 85 g.  Lobster (steamed), 4 oz / 113 g.  Milk (skim, 1%, 2%, whole), 1 c / 240 mL.  Milk chocolate, 2.3 oz / 66 g.  Milk shake.  Nonfat fruit variety yogurt, 123 g.  Peanuts (oil roasted), 1 oz / 28 g.  Peanut butter, 2 tbsp / 32 g.  Pistachio nuts, 1 oz / 28 g.  Pumpkin seeds, 1 oz / 28 g.  Red meat (broiled, cooked, grilled), 3 oz / 85 g.  Scallops (steamed), 3 oz / 85 g.  Shredded wheat cereal (dry), 3 oblong biscuits / 75 g.  Spaghetti sauce,  c / 66 g.  Sunflower seeds (dry roasted), 1 oz / 28 g.  Veggie burger, 1 patty / 70 g. FOODS MODERATE IN POTASSIUM Between 150 mg and 200 mg per serving. A serving is  c (120 mL or noted gram weight) unless otherwise stated. Fruits  Grapefruit,  of the fruit / 123 g.  Grapefruit juice.  Pineapple juice.  Plums (sliced), 83 g.  Tangerine, 1 large / 120 g. Vegetables  Carrots (boiled), 78 g.  Carrots (sliced), 61 g.  Rhubarb (cooked with sugar), 120 g.  R[place]abaga (cooked), 120 g.  Sweet corn (cooked), 75 g.  Yellow snap beans (cooked), 63 g. Other Foods and Drinks   Bagel, 1 bagel / 98 g.  Chicken breast (roasted and chopped),  c / 70 g.  Chocolate ice cream / 66 g.  Pita bread, 1 large / 64 g.  Shrimp (steamed), 4 oz / 113 g.  Swiss cheese (diced), 70 g.  Vanilla ice cream, 66 g.  Vanilla pudding, 140 g. FOODS LOW IN POTASSIUM Less than 150 mg per serving. A serving size is  cup (120 mL or noted gram weight) unless otherwise stated. If you eat more than 1 serving of a food low in potassium, the food may be considered a food high in potassium. Fruits  Apple (slices), 55 g.  Apple juice.  Applesauce, 122 g.  Blackberries, 72 g.  Blueberries, 74 g.  Cranberries, 50 g.  Cranberry juice.  Fruit cocktail, 119 g.  Fruit punch.  Grapes, 46 g.  Grape juice.  Mandarin  oranges (canned), 126 g.  Peach (slices), 77 g.  Pineapple (chunks), 83 g.  Raspberries,  62 g.  Red cherries (without pits), 78 g.  Strawberries (sliced), 83 g.  Watermelon (diced), 76 g. Vegetables  Alfalfa sprouts, 17 g.  Bell peppers (sliced), 46 g.  Cabbage (shredded), 35 g.  Cauliflower (boiled), 62 g.  Celery, 51 g.  Collard greens (boiled), 95 g.  Cucumber (sliced), 52 g.  Eggplant (cubed), 41 g.  Green beans (boiled), 63 g.  Lettuce (shredded), 1 c / 36 g.  Onions (sauteed), 44 g.  Radishes (sliced), 58 g.  Spaghetti squash, 51 g. Other Foods and Drinks  [employer], 1 slice / 28 g.  Black tea.  Brown rice (cooked), 98 g.  Butter croissant, 1 medium / 57 g.  Carbonated soda.  Coffee.  Cheddar cheese (diced), 66 g.  Corn flake cereal (dry), 14 g.  Cottage cheese, 118 g.  Cream of rice cereal (cooked), 122 g.  Cream of wheat cereal (cooked), 126 g.  Crisped rice cereal (dry), 14 g.  Egg (boiled, fried, poached, omelet, scrambled), 1 large / 46 61 g.  English muffin, 1 muffin / 57 g.  Frozen ice pop, 1 pop / 55 g.  Graham cracker, 1 large rectangular cracker / 14 g.  Jelly beans, 112 g.  Non-dairy whipped topping.  Oatmeal, 88 g.  Orange sherbet, 74 g.  Puffed rice cereal (dry), 7 g.  Pasta (cooked), 70 g.  Rice cakes, 4 cakes / 36 g.  Sugared doughnut, 4 oz / 116 g.  White bread, 1 slice / 30 g.  White rice (cooked), 79 93 g.  Wild rice (cooked), 82 g.  Yellow cake, 1 slice / 68 g. Document Released: 11/14/2004 Document Revised: 03/19/2012 Document Reviewed: 08/17/2011 [facility] Patient Information 2014 [place].

## 2013-05-27 NOTE — ED Notes (Signed)
Abd pain started yesterday-also c/o dysuria

## 2013-05-27 NOTE — ED Notes (Signed)
Pt states that she feels like something is pulling at her bladder, she reports that when she does void it is in small amounts and she is never able to fully empty her bladder

## 2013-05-28 LAB — GC/CHLAMYDIA PROBE AMP
CT Probe RNA: NEGATIVE
GC Probe RNA: NEGATIVE

## 2013-05-30 NOTE — ED Provider Notes (Signed)
Medical screening examination/treatment/procedure(s) were performed by non-physician practitioner and as supervising physician I was immediately available for consultation/collaboration.  EKG Interpretation   None        Threasa Beards, MD 05/30/13 [phone]

## 2013-07-27 ENCOUNTER — Emergency Department (HOSPITAL_BASED_OUTPATIENT_CLINIC_OR_DEPARTMENT_OTHER)
Admission: EM | Admit: 2013-07-27 | Discharge: 2013-07-27 | Disposition: A | Discharge status: Home / Self Care | Payer: Medicaid Other | Attending: Emergency Medicine | Admitting: Emergency Medicine

## 2013-07-27 ENCOUNTER — Emergency Department (HOSPITAL_BASED_OUTPATIENT_CLINIC_OR_DEPARTMENT_OTHER): Payer: Medicaid Other

## 2013-07-27 ENCOUNTER — Encounter (HOSPITAL_BASED_OUTPATIENT_CLINIC_OR_DEPARTMENT_OTHER): Payer: Self-pay | Admitting: Emergency Medicine

## 2013-07-27 ENCOUNTER — Ambulatory Visit: Payer: Medicaid Other | Admitting: Adult Health

## 2013-07-27 DIAGNOSIS — Z79899 Other long term (current) drug therapy: Secondary | ICD-10-CM | POA: Insufficient documentation

## 2013-07-27 DIAGNOSIS — F411 Generalized anxiety disorder: Secondary | ICD-10-CM | POA: Insufficient documentation

## 2013-07-27 DIAGNOSIS — I10 Essential (primary) hypertension: Secondary | ICD-10-CM | POA: Insufficient documentation

## 2013-07-27 DIAGNOSIS — G43909 Migraine, unspecified, not intractable, without status migrainosus: Secondary | ICD-10-CM | POA: Insufficient documentation

## 2013-07-27 DIAGNOSIS — Z9981 Dependence on supplemental oxygen: Secondary | ICD-10-CM | POA: Insufficient documentation

## 2013-07-27 DIAGNOSIS — F3289 Other specified depressive episodes: Secondary | ICD-10-CM | POA: Insufficient documentation

## 2013-07-27 DIAGNOSIS — F329 Major depressive disorder, single episode, unspecified: Secondary | ICD-10-CM | POA: Insufficient documentation

## 2013-07-27 DIAGNOSIS — J45901 Unspecified asthma with (acute) exacerbation: Secondary | ICD-10-CM | POA: Insufficient documentation

## 2013-07-27 DIAGNOSIS — Z791 Long term (current) use of non-steroidal anti-inflammatories (NSAID): Secondary | ICD-10-CM | POA: Insufficient documentation

## 2013-07-27 DIAGNOSIS — J40 Bronchitis, not specified as acute or chronic: Secondary | ICD-10-CM

## 2013-07-27 DIAGNOSIS — IMO0001 Reserved for inherently not codable concepts without codable children: Secondary | ICD-10-CM | POA: Insufficient documentation

## 2013-07-27 DIAGNOSIS — Z91040 Latex allergy status: Secondary | ICD-10-CM | POA: Insufficient documentation

## 2013-07-27 DIAGNOSIS — IMO0002 Reserved for concepts with insufficient information to code with codable children: Secondary | ICD-10-CM | POA: Insufficient documentation

## 2013-07-27 DIAGNOSIS — Z862 Personal history of diseases of the blood and blood-forming organs and certain disorders involving the immune mechanism: Secondary | ICD-10-CM | POA: Insufficient documentation

## 2013-07-27 MED ORDER — IPRATROPIUM-ALBUTEROL 0.5-2.5 (3) MG/3ML IN SOLN
3.0000 mL | RESPIRATORY_TRACT | Status: DC
  Administered 2013-07-27: 3 mL via RESPIRATORY_TRACT
  Filled 2013-07-27: qty 3

## 2013-07-27 MED ORDER — ALBUTEROL SULFATE (2.5 MG/3ML) 0.083% IN NEBU
2.5000 mg | INHALATION_SOLUTION | Freq: Once | RESPIRATORY_TRACT | Status: AC
  Administered 2013-07-27: 2.5 mg via RESPIRATORY_TRACT
  Filled 2013-07-27: qty 3

## 2013-07-27 MED ORDER — DEXAMETHASONE SODIUM PHOSPHATE 10 MG/ML IJ SOLN
10.0000 mg | Freq: Once | INTRAMUSCULAR | Status: DC
  Filled 2013-07-27: qty 1

## 2013-07-27 MED ORDER — PREDNISONE 20 MG PO TABS: ORAL_TABLET | ORAL | Status: DC

## 2013-07-27 MED ORDER — ALBUTEROL SULFATE (2.5 MG/3ML) 0.083% IN NEBU
5.0000 mg | INHALATION_SOLUTION | Freq: Once | RESPIRATORY_TRACT | Status: AC
  Administered 2013-07-27: 5 mg via RESPIRATORY_TRACT
  Filled 2013-07-27: qty 6

## 2013-07-27 MED ORDER — AZITHROMYCIN 250 MG PO TABS
500.0000 mg | ORAL_TABLET | Freq: Once | ORAL | Status: AC
  Administered 2013-07-27: 500 mg via ORAL
  Filled 2013-07-27: qty 2

## 2013-07-27 MED ORDER — DEXAMETHASONE SODIUM PHOSPHATE 10 MG/ML IJ SOLN
10.0000 mg | Freq: Once | INTRAMUSCULAR | Status: AC
  Administered 2013-07-27: 10 mg via INTRAMUSCULAR

## 2013-07-27 MED ORDER — AZITHROMYCIN 250 MG PO TABS: ORAL_TABLET | ORAL | Status: DC

## 2013-07-27 NOTE — ED Notes (Signed)
Pt reports SOB x 3 days worse today predinsone not helping  And SOB increasing

## 2013-07-27 NOTE — ED Provider Notes (Signed)
CSN: [id number]     Arrival date & time 07/27/13  0027 History   First MD Initiated Contact with Patient 07/27/13 0120     Chief Complaint  Patient presents with  . Shortness of Breath     (Consider location/radiation/quality/duration/timing/severity/associated sxs/prior Treatment) Patient is a 38 y.o. female presenting with wheezing. The history is provided by the patient. No language interpreter was used.  Wheezing Severity:  Moderate Severity compared to prior episodes:  Similar Onset quality:  Sudden Timing:  Constant Progression:  Unchanged Chronicity:  Recurrent Context: exposure to allergen   Relieved by:  Nothing Worsened by:  Nothing tried Ineffective treatments:  Nebulizer treatments Associated symptoms: cough and sputum production   Associated symptoms: no chest pain and no fever   Risk factors: not exposed to toxic fumes     Past Medical History  Diagnosis Date  . Hypertension   . Migraine   . Anxiety   . Abnormal Pap smear   . Anemia   . Fibroids   . Fibromyalgia     degenerative disc disease  . Depression   . Sleep apnea   . Shortness of breath   . On home oxygen therapy     2 liters [place]  . PONV (postoperative nausea and vomiting)   . Low iron     pt states low iron-for Feraheme today in [facility]  . Morbid obesity with BMI of 45.0-49.9, adult   . Asthma     recent admission with exacerbation of asthma 09/15/11  . Iron deficiency anemia 04/24/2012   Past Surgical History  Procedure Laterality Date  . Wisdom tooth extraction    . Mandible surgery  2008  . Carpal tunnel release    . Cesarean section      1994, 1996   . Cesarean section  11/28/2011    Procedure: CESAREAN SECTION;  Surgeon: Woodroe Mode, MD;  Location: [facility];  Service: Obstetrics;  Laterality: N/A;   Family History  Problem Relation Age of Onset  . Sickle cell trait Maternal Aunt   . Sickle cell anemia Other    History  Substance Use Topics  . Smoking status: Never Smoker   .  Smokeless tobacco: Never Used     Comment: Pt was exposed to 2nd hand smoke at work years ago.  . Alcohol Use: No   OB History   Grav Para Term Preterm Abortions TAB SAB Ect Mult Living   3 3 3       3      Review of Systems  Constitutional: Negative for fever.  Respiratory: Positive for cough, sputum production and wheezing.   Cardiovascular: Negative for chest pain, palpitations and leg swelling.  All other systems reviewed and are negative.     Allergies  Latex and Morphine and related  Home Medications   Current Outpatient Rx  Name  Route  Sig  Dispense  Refill  . albuterol (PROVENTIL HFA;VENTOLIN HFA) 108 (90 BASE) MCG/ACT inhaler   Inhalation   Inhale 2 puffs into the lungs every 6 (six) hours as needed. for asthma/shortness of breath   1 Inhaler   5   . amLODipine (NORVASC) 10 MG tablet   Oral   Take 10 mg by mouth daily.         . budesonide (PULMICORT) 0.5 MG/2ML nebulizer solution      1 vial twice a day Dx 493.90   120 mL   3   . budesonide (PULMICORT) 0.5 MG/2ML nebulizer solution  Nebulization   Take 0.5 mg by nebulization 2 (two) times daily.         . clonazePAM (KLONOPIN) 0.5 MG tablet   Oral   Take 0.5 mg by mouth 2 (two) times daily as needed for anxiety.          . cyclobenzaprine (FLEXERIL) 10 MG tablet   Oral   Take 10 mg by mouth 3 (three) times daily as needed. For muscle spasms         . FLUoxetine (PROZAC) 20 MG tablet   Oral   Take 20 mg by mouth daily.         [name] EXPIRED: fluticasone (FLONASE) 50 MCG/ACT nasal spray   Nasal   Place 2 sprays into the nose 2 (two) times daily.   16 g   12   . EXPIRED: furosemide (LASIX) 40 MG tablet   Oral   Take 1 tablet (40 mg total) by mouth daily.   10 tablet   1   . ibuprofen (ADVIL,MOTRIN) 800 MG tablet   Oral   Take 800 mg by mouth 3 (three) times daily.         [name] ipratropium-albuterol (DUONEB) 0.5-2.5 (3) MG/3ML SOLN   Nebulization   Take 3 mLs by nebulization every  4 (four) hours as needed (for wheezing).         . EXPIRED: loratadine (CLARITIN) 10 MG tablet   Oral   Take 1 tablet (10 mg total) by mouth daily.   31 tablet   12   . EXPIRED: metoprolol tartrate (LOPRESSOR) 25 MG tablet   Oral   Take 1 tablet (25 mg total) by mouth 2 (two) times daily.   360 tablet   6   . montelukast (SINGULAIR) 10 MG tablet   Oral   Take 10 mg by mouth at bedtime.         . Multiple Vitamins-Minerals (MULTIVITAMIN WITH MINERALS) tablet   Oral   Take 1 tablet by mouth daily.         . Oxycodone HCl 10 MG TABS   Oral   Take 10 mg by mouth 3 (three) times daily.         Donell Sievert IN   Other   by Other route. Uses 2 Liters of Oxygen Continuously         . pantoprazole (PROTONIX) 40 MG tablet   Oral   Take 40 mg by mouth daily.         [name] EXPIRED: potassium chloride SA (K-DUR,KLOR-CON) 20 MEQ tablet   Oral   Take 1 tablet (20 mEq total) by mouth daily.   10 tablet   1   . predniSONE (DELTASONE) 10 MG tablet   Oral   Take 10 mg by mouth daily with breakfast.         . pregabalin (LYRICA) 75 MG capsule   Oral   Take 75 mg by mouth 2 (two) times daily.         . rizatriptan (MAXALT) 10 MG tablet   Oral   Take 10 mg by mouth daily as needed. May repeat in 2 hours if needed         . sennosides-docusate sodium (SENOKOT-S) 8.6-50 MG tablet   Oral   Take 2 tablets by mouth daily.         [name] topiramate (TOPAMAX) 25 MG tablet   Oral   Take 75 mg by mouth 2 (two) times daily.         [name]  zolpidem (AMBIEN) 5 MG tablet   Oral   Take 5 mg by mouth at bedtime as needed for sleep.           BP 145/83  Pulse 98  Temp(Src) 98.5 F (36.9 C) (Oral)  Resp 24  SpO2 100%  LMP 07/20/2013 Physical Exam  Constitutional: She is oriented to person, place, and time. She appears well-developed and well-nourished. No distress.  HENT:  Head: Normocephalic and atraumatic.  Mouth/Throat: Oropharynx is clear and moist.  Eyes:  Conjunctivae are normal. Pupils are equal, round, and reactive to light.  Neck: Normal range of motion. Neck supple.  Cardiovascular: Normal rate, regular rhythm and intact distal pulses.   Pulmonary/Chest: Effort normal. No respiratory distress. She has wheezes. She has no rales. She exhibits no tenderness.  Abdominal: Soft. Bowel sounds are normal. There is no tenderness. There is no rebound and no guarding.  Musculoskeletal: Normal range of motion. She exhibits no edema and no tenderness.  Neurological: She is alert and oriented to person, place, and time.  Skin: Skin is warm and dry.  Psychiatric: She has a normal mood and affect.    ED Course  Procedures (including critical care time) Labs Review Labs Reviewed - No data to display Imaging Review Dg Chest 2 View  07/27/2013   CLINICAL DATA:  SHORTNESS OF BREATH  EXAM: CHEST  2 VIEW  COMPARISON:  DG CHEST 2 VIEW dated 08/21/2012  FINDINGS: Cardiomediastinal silhouette is unremarkable. The lungs are clear without pleural effusions or focal consolidations. Trachea projects midline and there is no pneumothorax. Soft tissue planes and included osseous structures are non-suspicious.  IMPRESSION: No active cardiopulmonary disease.   Electronically Signed   By: Elon Alas   On: 07/27/2013 02:22     EKG Interpretation None      MDM  Perc negative wells 0 Final diagnoses:  None   Bronchitis like secondary to her tree pollen allergies but given history will add Zpac and steroids follow up tomorrow with Dr. Halford Chessman.     Medications  ipratropium-albuterol (DUONEB) 0.5-2.5 (3) MG/3ML nebulizer solution 3 mL (3 mLs Nebulization Given 07/27/13 0222)  albuterol (PROVENTIL) (2.5 MG/3ML) 0.083% nebulizer solution 5 mg (5 mg Nebulization Given 07/27/13 0047)  azithromycin (ZITHROMAX) tablet 500 mg (500 mg Oral Given 07/27/13 0228)  dexamethasone (DECADRON) injection 10 mg (10 mg Intramuscular Given 07/27/13 0228)       Raychel Dowler K Modena Bellemare-Rasch,  MD 07/27/13 0230

## 2013-07-27 NOTE — Discharge Instructions (Signed)

## 2013-07-29 ENCOUNTER — Ambulatory Visit (INDEPENDENT_AMBULATORY_CARE_PROVIDER_SITE_OTHER): Payer: Medicaid Other | Admitting: Adult Health

## 2013-07-29 ENCOUNTER — Encounter: Payer: Self-pay | Admitting: Adult Health

## 2013-07-29 VITALS — BP 136/78 | HR 98 | Temp 98.7°F | Ht 62.0 in | Wt 273.2 lb

## 2013-07-29 DIAGNOSIS — IMO0002 Reserved for concepts with insufficient information to code with codable children: Secondary | ICD-10-CM

## 2013-07-29 DIAGNOSIS — J45909 Unspecified asthma, uncomplicated: Secondary | ICD-10-CM

## 2013-07-29 DIAGNOSIS — G4733 Obstructive sleep apnea (adult) (pediatric): Secondary | ICD-10-CM

## 2013-07-29 MED ORDER — FLUTICASONE PROPIONATE 50 MCG/ACT NA SUSP: 2.0000 | Freq: Two times a day (BID) | NASAL | Status: DC

## 2013-07-29 MED ORDER — BUDESONIDE 0.5 MG/2ML IN SUSP: RESPIRATORY_TRACT | Status: DC

## 2013-07-29 MED ORDER — PREDNISONE 10 MG PO TABS: 10.0000 mg | ORAL_TABLET | Freq: Every day | ORAL | Status: DC

## 2013-07-29 MED ORDER — PANTOPRAZOLE SODIUM 40 MG PO TBEC: 40.0000 mg | DELAYED_RELEASE_TABLET | Freq: Every day | ORAL | Status: DC

## 2013-07-29 MED ORDER — IPRATROPIUM-ALBUTEROL 0.5-2.5 (3) MG/3ML IN SOLN: 3.0000 mL | RESPIRATORY_TRACT | Status: DC | PRN

## 2013-07-29 MED ORDER — ALBUTEROL SULFATE HFA 108 (90 BASE) MCG/ACT IN AERS: 2.0000 | INHALATION_SPRAY | Freq: Four times a day (QID) | RESPIRATORY_TRACT | Status: DC | PRN

## 2013-07-29 MED ORDER — MONTELUKAST SODIUM 10 MG PO TABS: 10.0000 mg | ORAL_TABLET | Freq: Every day | ORAL | Status: DC

## 2013-07-29 NOTE — Addendum Note (Signed)
Addended by: Maurice March on: 07/29/2013 01:05 PM   Modules accepted: Orders

## 2013-07-29 NOTE — Assessment & Plan Note (Signed)
Continue on nocturnal CPAP Weight loss encouraged Obtain recent download results

## 2013-07-29 NOTE — Addendum Note (Signed)
Addended by: Parke Poisson E on: 07/29/2013 12:12 PM   Modules accepted: Orders

## 2013-07-29 NOTE — Addendum Note (Signed)
Addended by: Parke Poisson E on: 07/29/2013 01:04 PM   Modules accepted: Orders

## 2013-07-29 NOTE — Progress Notes (Signed)
   Subjective:    Patient ID: Cheyenne Arias, female    DOB: 01-23-1976, 38 y.o.   MRN: [id number]  HPI 38 yo with asthma, OSA, OHS.  Tests: PSG 03/27/11 >> AHI 10.9 Echo 09/20/11 > >mod LVH, EF 65 to 70%, small/mod pericardial effusion  Doppler legs 09/21/11 >> no DVT CT chest 09/22/11 >> no PE, Lt base ATX/ASD, borderline cardiomegaly Spirometry 01/10/12 >> FEV1 1.72 (71%), FEV1% 77 CT sinus 02/28/12 >> clear paranasal sinuses RAST 02/28/12 >> IgE 15.6, moderate reaction to [facility] CT chest 04/03/12 >> ASD superior segment LLL, RLL ATX PFT 12/17/12 >> FEV1 2.14 (95%), FEV1% 88, TLC 2.62 (58%), DLCO 96%, no BD   07/29/2013 ED follow up  Patient returns for an emergency room followup. Patient was seen in emergency room on April 13 for an asthma /bronchitis  exacerbation. She was given a Z-Pak. And prednisone pulse.  She was given a Decadron injection in the emergency room. And given nebulizer treatments x2.  Patient says that she is starting to feel better with decreased cough, shortness of breath and wheezing. Patient is on Pulmicort nebs twice daily.  Takes DuoNeb nebulizers as needed. She is on daily prednisone 5 mg daily-currently on 30mg  daily . CXR was clear.  Says she is wearing her CPAP daily at bedtime .  Left download chip at DME but has not gotten results.      Review of Systems Constitutional:   No  weight loss, night sweats,  Fevers, chills,  +fatigue, or  lassitude.  HEENT:   No headaches,  Difficulty swallowing,  Tooth/dental problems, or  Sore throat,                No sneezing, itching, ear ache,  +nasal congestion, post nasal drip,   CV:  No chest pain,  Orthopnea, PND, swelling in lower extremities, anasarca, dizziness, palpitations, syncope.   GI  No heartburn, indigestion, abdominal pain, nausea, vomiting, diarrhea, change in bowel habits, loss of appetite, bloody stools.   Resp:    No chest wall deformity  Skin: no rash or lesions.  GU: no dysuria,  change in color of urine, no urgency or frequency.  No flank pain, no hematuria   MS:  No joint pain or swelling.  No decreased range of motion.  No back pain.  Psych:  No change in mood or affect. No depression or anxiety.  No memory loss.         Objective:   Physical Exam GEN: A/Ox3; pleasant , NAD, morbidly obese   HEENT:  [place]/AT,  EACs-clear, TMs-wnl, NOSE-clear, THROAT-clear, no lesions, no postnasal drip or exudate noted.   NECK:  Supple w/ fair ROM; no JVD; normal carotid impulses w/o bruits; no thyromegaly or nodules palpated; no lymphadenopathy.  RESP  Few scant wheezes , no accessory muscle use, no dullness to percussion  CARD:  RRR, no m/r/g  , no peripheral edema, pulses intact, no cyanosis or clubbing.  GI:   Soft & nt; nml bowel sounds; no organomegaly or masses detected.  Musco: Warm bil, no deformities or joint swelling noted.   Neuro: alert, no focal deficits noted.    Skin: Warm, no lesions or rashes         Assessment & Plan:

## 2013-07-29 NOTE — Patient Instructions (Signed)
Finish Zpack .  Saline nasal rinses As needed   Mucinex DM Twice daily  As needed  Cough/congestion  Taper prednisone 30mg  daily for 5 d, then 20mg  daily for 5 days , then 10mg  daily for 5 days then back to 5mg  daily .  Keep CPAP At bedtime   Weight loss.  Will obtain Download results and call with recommendations Follow up Dr. Halford Chessman  In 4 weeks and As needed

## 2013-07-29 NOTE — Assessment & Plan Note (Addendum)
Reason exacerbation, now resolving on steroids, and antibiotics  Plan  Finish Zpack .  Saline nasal rinses As needed   Mucinex DM Twice daily  As needed  Cough/congestion  Taper prednisone 30mg  daily for 5 d, then 20mg  daily for 5 days , then 10mg  daily for 5 days then back to 5mg  daily .  follow up Dr. Halford Chessman  In 4 weeks and  As needed   Please contact office for sooner follow up if symptoms do not improve or worsen or seek emergency care

## 2013-07-29 NOTE — Addendum Note (Signed)
Addended by: Parke Poisson E on: 07/29/2013 12:57 PM   Modules accepted: Orders

## 2013-08-26 ENCOUNTER — Encounter (INDEPENDENT_AMBULATORY_CARE_PROVIDER_SITE_OTHER): Payer: Self-pay

## 2013-08-26 ENCOUNTER — Ambulatory Visit (INDEPENDENT_AMBULATORY_CARE_PROVIDER_SITE_OTHER): Payer: Medicaid Other | Admitting: Pulmonary Disease

## 2013-08-26 ENCOUNTER — Encounter: Payer: Self-pay | Admitting: Pulmonary Disease

## 2013-08-26 VITALS — BP 110/76 | HR 103 | Ht 62.0 in | Wt 282.0 lb

## 2013-08-26 DIAGNOSIS — R0902 Hypoxemia: Secondary | ICD-10-CM

## 2013-08-26 DIAGNOSIS — G4733 Obstructive sleep apnea (adult) (pediatric): Secondary | ICD-10-CM

## 2013-08-26 DIAGNOSIS — J9611 Chronic respiratory failure with hypoxia: Secondary | ICD-10-CM

## 2013-08-26 DIAGNOSIS — J9610 Chronic respiratory failure, unspecified whether with hypoxia or hypercapnia: Secondary | ICD-10-CM

## 2013-08-26 DIAGNOSIS — J45909 Unspecified asthma, uncomplicated: Secondary | ICD-10-CM

## 2013-08-26 DIAGNOSIS — IMO0002 Reserved for concepts with insufficient information to code with codable children: Secondary | ICD-10-CM

## 2013-08-26 DIAGNOSIS — J329 Chronic sinusitis, unspecified: Secondary | ICD-10-CM

## 2013-08-26 MED ORDER — PREDNISONE 5 MG PO TABS: ORAL_TABLET | ORAL | Status: DC

## 2013-08-26 MED ORDER — LORATADINE 10 MG PO TABS: 10.0000 mg | ORAL_TABLET | Freq: Every day | ORAL | Status: DC

## 2013-08-26 NOTE — Progress Notes (Signed)
Chief Complaint  Patient presents with  . Follow-up    Breathing is unchanged. Reports having bronchitis recently. Feels like her CPAP pressure needs to be increased.    History of Present Illness: Cheyenne Arias is a 38 y.o. female with asthma, OSA, OHS.  Since I saw her last she was in the ER with asthma exacerbation.  She was treated with abx and increased prednisone.  She is doing better.  She is concerned about weight gain, and how this is affecting her breathing.  She has not been having as much cough, wheeze, or sinus congestion.  She is not coughing up much sputum.  She denies fever.  She has some sinus congestion.  She is using her oxygen more during the day.  She is concerned about feeling more sleepy during the day.  She uses her CPAP every night, but is not sure if she is getting enough pressure.   Tests: PSG 03/27/11 >> AHI 10.9 Echo 09/20/11 > >mod LVH, EF 65 to 70%, small/mod pericardial effusion  Doppler legs 09/21/11 >> no DVT CT chest 09/22/11 >> no PE, Lt base ATX/ASD, borderline cardiomegaly Spirometry 01/10/12 >> FEV1 1.72 (71%), FEV1% 77 CT sinus 02/28/12 >> clear paranasal sinuses RAST 02/28/12 >> IgE 15.6, moderate reaction to [facility] CT chest 04/03/12 >> ASD superior segment LLL, RLL ATX PFT 12/17/12 >> FEV1 2.14 (95%), FEV1% 88, TLC 2.62 (58%), DLCO 96%, no BD CPAP 03/01/13 to 03/30/13 >> Used on 18 of 30 nights with average 5 hrs 58 minutes.  Average AHI 0.7 with CPAP 10 cm H2O. She  has a past medical history of Hypertension; Migraine; Anxiety; Abnormal Pap smear; Anemia; Fibroids; Fibromyalgia; Depression; Sleep apnea; Shortness of breath; On home oxygen therapy; PONV (postoperative nausea and vomiting); Low iron; Morbid obesity with BMI of 45.0-49.9, adult; Asthma; and Iron deficiency anemia (04/24/2012).  She  has past surgical history that includes Wisdom tooth extraction; Mandible surgery (2008); Carpal tunnel release; Cesarean section; and Cesarean section  (11/28/2011).    Prior to Admission medications   Medication Sig Start Date End Date Taking? Authorizing Provider  albuterol (PROVENTIL HFA;VENTOLIN HFA) 108 (90 BASE) MCG/ACT inhaler Inhale 2 puffs into the lungs every 6 (six) hours as needed. for asthma/shortness of breath 02/15/12  Yes Chesley Mires, MD  amLODipine (NORVASC) 10 MG tablet Take 1 tablet (10 mg total) by mouth daily. 09/28/11 09/27/12 Yes Myra Marijo Sanes, MD  budesonide (PULMICORT) 0.5 MG/2ML nebulizer solution 1 vial twice a day Dx 493.90 04/14/12  Yes Chesley Mires, MD  clonazePAM (KLONOPIN) 0.5 MG tablet Take 0.5 mg by mouth 2 (two) times daily as needed.   Yes Historical Provider, MD  cyclobenzaprine (FLEXERIL) 10 MG tablet Take 10 mg by mouth 3 (three) times daily as needed. For muscle spasms   Yes Historical Provider, MD  ferrous sulfate 325 (65 FE) MG tablet Take 1 tablet (325 mg total) by mouth daily with breakfast. 11/30/11 11/29/12 Yes Christin Fudge, CNM  FLUoxetine (PROZAC) 20 MG tablet Take 20 mg by mouth daily.   Yes Historical Provider, MD  fluticasone (FLONASE) 50 MCG/ACT nasal spray Place 2 sprays into the nose 2 (two) times daily. 09/28/11 09/27/12 Yes Myra Marijo Sanes, MD  furosemide (LASIX) 40 MG tablet Take 1 tablet (40 mg total) by mouth daily. 10/25/11 10/24/12 Yes Donnamae Jude, MD  ipratropium-albuterol (DUONEB) 0.5-2.5 (3) MG/3ML SOLN Take 3 mLs by nebulization every 4 (four) hours as needed. For asthma symptoms   Yes Historical Provider, MD  loratadine (CLARITIN) 10 MG tablet Take 1 tablet (10 mg total) by mouth daily. 09/28/11 09/27/12 Yes Myra Marijo Sanes, MD  metoprolol tartrate (LOPRESSOR) 25 MG tablet Take 1 tablet (25 mg total) by mouth 2 (two) times daily. 09/28/11 09/27/12 Yes Myra Marijo Sanes, MD  montelukast (SINGULAIR) 10 MG tablet Take 1 tablet (10 mg total) by mouth at bedtime. 01/10/12  Yes Chesley Mires, MD  nystatin cream (MYCOSTATIN) Apply to affected area 2 times daily 04/08/12  Yes Malvin Johns, MD   oxyCODONE-acetaminophen (PERCOCET/ROXICET) 5-325 MG per tablet Take 2 tablets by mouth every 4 (four) hours as needed for pain. 02/07/12  Yes Kaitlyn Szekalski, PA-C  potassium chloride SA (K-DUR,KLOR-CON) 20 MEQ tablet Take 1 tablet (20 mEq total) by mouth daily. 10/22/11 10/21/12 Yes Donnamae Jude, MD  predniSONE (DELTASONE) 10 MG tablet 1.5 pills daily for 2 weeks, then 1 pill daily 06/10/12  Yes Chesley Mires, MD  pregabalin (LYRICA) 75 MG capsule Take 75 mg by mouth 2 (two) times daily.   Yes Historical Provider, MD  Prenatal Vit-Fe Fumarate-FA (PRENATAL MULTIVITAMIN) TABS Take 1 tablet by mouth every morning.   Yes Historical Provider, MD  PULMICORT 0.5 MG/2ML nebulizer solution TAKE 1 VIAL (0.5 MG TOTAL) BY NEBULIZATION 2 (TWO) TIMES DAILY. 03/25/12  Yes Chesley Mires, MD  rizatriptan (MAXALT) 10 MG tablet Take 10 mg by mouth as needed. May repeat in 2 hours if needed   Yes Historical Provider, MD  senna-docusate (SENOKOT-S) 8.6-50 MG per tablet Take 2 tablets by mouth at bedtime. 11/30/11 11/29/12 Yes Joaquim Lai Cresenzo-Dishmon, CNM  Topiramate (TOPAMAX PO) Take by mouth as needed.   Yes Historical Provider, MD  zolpidem (AMBIEN) 5 MG tablet Take 5 mg by mouth at bedtime as needed.   Yes Historical Provider, MD   Allergies  Allergen Reactions  . Latex Itching and Swelling    Blisters  . Morphine And Related Itching    Physical Exam:  General - no distress, moon facies, wearing oxygen HEENT - no sinus tenderness, no oral exudate, no LAN, MP 2, 3+ tonsil Cardiac - s1s2 regular, no murmur Chest - no wheeze/rales Abd - obese, soft, non-tender Ext - no edema Neuro - normal strength Psych - normal mood behavior  CMP     Component Value Date/Time   NA 139 08/29/2012 1102   K 3.2* 08/29/2012 1102   CL 105 08/29/2012 1102   CO2 23 08/29/2012 1102   GLUCOSE 130* 08/29/2012 1102   BUN 5.8* 08/29/2012 1102   CREATININE 0.8 08/29/2012 1102   CALCIUM 8.6 08/29/2012 1102   PROT 7.3 08/29/2012 1102    ALBUMIN 3.6 08/29/2012 1102   AST 22 08/29/2012 1102   ALT 23 08/29/2012 1102   ALKPHOS 86 08/29/2012 1102   BILITOT 0.22 08/29/2012 1102    CBC    Component Value Date/Time   WBC 10.6* 08/29/2012 1103   RBC 5.00 08/29/2012 1103   HGB 10.7* 08/29/2012 1103   HCT 35.7 08/29/2012 1103   PLT 425* 08/29/2012 1103   MCV 71.4* 08/29/2012 1103   MCH 21.4* 08/29/2012 1103   MCHC 30.0* 08/29/2012 1103   RDW 17.1* 08/29/2012 1103   MONOABS 0.5 08/29/2012 1103   EOSABS 0.2 08/29/2012 1103   BASOSABS 0.0 08/29/2012 1103    Dg Chest 2 View  07/27/2013   CLINICAL DATA:  SHORTNESS OF BREATH  EXAM: CHEST  2 VIEW  COMPARISON:  DG CHEST 2 VIEW dated 08/21/2012  FINDINGS: Cardiomediastinal silhouette is unremarkable. The lungs  are clear without pleural effusions or focal consolidations. Trachea projects midline and there is no pneumothorax. Soft tissue planes and included osseous structures are non-suspicious.  IMPRESSION: No active cardiopulmonary disease.   Electronically Signed   By: Elon Alas   On: 07/27/2013 02:22     Assessment/Plan:  Chesley Mires, MD [facility]Critical [facility]/Sleep Pager:  [phone] 08/26/2013, 11:33 AM

## 2013-08-26 NOTE — Assessment & Plan Note (Signed)
Will get report from her CPAP machine and call with results.  She is having more difficulty with daytime sleepiness since she gained weight.

## 2013-08-26 NOTE — Assessment & Plan Note (Signed)
Continue flonase, claritin, singulair.

## 2013-08-26 NOTE — Assessment & Plan Note (Signed)
Will gradually try to decrease dose of prednisone.  She is to continue pulmicort, duoneb, and singulair.

## 2013-08-26 NOTE — Assessment & Plan Note (Signed)
Related to obesity/hypoventilation.  She is to continue supplemental oxygen.   

## 2013-08-26 NOTE — Patient Instructions (Signed)
Prednisone 5 mg pill >> 1.5 pills daily for 2 weeks, then 1 pill daily for 2 weeks, then 1/2 pill daily Will get CPAP report and call with results Follow up in 8 weeks

## 2013-09-01 ENCOUNTER — Encounter: Payer: Self-pay | Admitting: Pulmonary Disease

## 2013-10-21 ENCOUNTER — Ambulatory Visit: Payer: Medicaid Other | Admitting: Pulmonary Disease

## 2013-10-29 ENCOUNTER — Telehealth: Payer: Self-pay | Admitting: *Deleted

## 2013-10-29 ENCOUNTER — Other Ambulatory Visit: Payer: Self-pay | Admitting: Hematology and Oncology

## 2013-10-29 DIAGNOSIS — D509 Iron deficiency anemia, unspecified: Secondary | ICD-10-CM

## 2013-10-29 NOTE — Telephone Encounter (Signed)
I placed POF for labs, see me and IV iron same day

## 2013-10-29 NOTE — Telephone Encounter (Signed)
Pt left VM states GYN told her she is anemic and her "levels are low."  Pt was told to contact our office for appt..  She is former pt of Dr. Lamonte Sakai.   Called pt back and she says she was told her Hgb today is 9.2.  They did not check her iron but she is worried her iron level is low.  She feels very tired.

## 2013-10-30 ENCOUNTER — Telehealth: Payer: Self-pay | Admitting: Hematology and Oncology

## 2013-10-30 NOTE — Telephone Encounter (Signed)
lvm for pt regarding to July appt. °

## 2013-11-03 ENCOUNTER — Telehealth: Payer: Self-pay | Admitting: Hematology and Oncology

## 2013-11-03 ENCOUNTER — Other Ambulatory Visit (HOSPITAL_BASED_OUTPATIENT_CLINIC_OR_DEPARTMENT_OTHER): Payer: Medicaid Other

## 2013-11-03 DIAGNOSIS — D509 Iron deficiency anemia, unspecified: Secondary | ICD-10-CM

## 2013-11-03 LAB — CBC & DIFF AND RETIC
BASO%: 0.4 % (ref 0.0–2.0)
Basophils Absolute: 0 10*3/uL (ref 0.0–0.1)
EOS%: 2.3 % (ref 0.0–7.0)
Eosinophils Absolute: 0.3 10*3/uL (ref 0.0–0.5)
HCT: 30.4 % — ABNORMAL LOW (ref 34.8–46.6)
HGB: 9.1 g/dL — ABNORMAL LOW (ref 11.6–15.9)
Immature Retic Fract: 19.3 % — ABNORMAL HIGH (ref 1.60–10.00)
LYMPH#: 1.6 10*3/uL (ref 0.9–3.3)
LYMPH%: 14.4 % (ref 14.0–49.7)
MCH: 19 pg — AB (ref 25.1–34.0)
MCHC: 30 g/dL — AB (ref 31.5–36.0)
MCV: 63.4 fL — ABNORMAL LOW (ref 79.5–101.0)
MONO#: 0.6 10*3/uL (ref 0.1–0.9)
MONO%: 5 % (ref 0.0–14.0)
NEUT#: 8.8 10*3/uL — ABNORMAL HIGH (ref 1.5–6.5)
NEUT%: 77.9 % — AB (ref 38.4–76.8)
Platelets: 485 10*3/uL — ABNORMAL HIGH (ref 145–400)
RBC: 4.8 10*6/uL (ref 3.70–5.45)
RDW: 18.3 % — ABNORMAL HIGH (ref 11.2–14.5)
RETIC CT ABS: 50.4 10*3/uL (ref 33.70–90.70)
Retic %: 1.05 % (ref 0.70–2.10)
WBC: 11.3 10*3/uL — AB (ref 3.9–10.3)

## 2013-11-03 LAB — IRON AND TIBC CHCC
%SAT: 4 % — ABNORMAL LOW (ref 21–57)
Iron: 14 ug/dL — ABNORMAL LOW (ref 41–142)
TIBC: 348 ug/dL (ref 236–444)
UIBC: 334 ug/dL (ref 120–384)

## 2013-11-03 LAB — FERRITIN CHCC: Ferritin: 50 ng/ml (ref 9–269)

## 2013-11-03 LAB — HOLD TUBE, BLOOD BANK

## 2013-11-03 NOTE — Telephone Encounter (Signed)
pt came in early...wanted labs done....pt ok adn aware of tomorrow appt

## 2013-11-04 ENCOUNTER — Other Ambulatory Visit: Payer: Medicaid Other

## 2013-11-04 ENCOUNTER — Ambulatory Visit (HOSPITAL_BASED_OUTPATIENT_CLINIC_OR_DEPARTMENT_OTHER): Payer: Medicaid Other

## 2013-11-04 ENCOUNTER — Ambulatory Visit (HOSPITAL_BASED_OUTPATIENT_CLINIC_OR_DEPARTMENT_OTHER): Payer: Medicaid Other | Admitting: Hematology and Oncology

## 2013-11-04 ENCOUNTER — Encounter: Payer: Self-pay | Admitting: Hematology and Oncology

## 2013-11-04 VITALS — BP 101/52 | HR 98 | Temp 98.4°F | Resp 18

## 2013-11-04 VITALS — BP 98/58 | HR 97 | Temp 99.0°F | Resp 18 | Ht 62.0 in | Wt 287.4 lb

## 2013-11-04 DIAGNOSIS — D75838 Other thrombocytosis: Secondary | ICD-10-CM

## 2013-11-04 DIAGNOSIS — D509 Iron deficiency anemia, unspecified: Secondary | ICD-10-CM

## 2013-11-04 DIAGNOSIS — R040 Epistaxis: Secondary | ICD-10-CM

## 2013-11-04 DIAGNOSIS — R7989 Other specified abnormal findings of blood chemistry: Secondary | ICD-10-CM

## 2013-11-04 DIAGNOSIS — G4733 Obstructive sleep apnea (adult) (pediatric): Secondary | ICD-10-CM

## 2013-11-04 DIAGNOSIS — R5383 Other fatigue: Secondary | ICD-10-CM

## 2013-11-04 DIAGNOSIS — D539 Nutritional anemia, unspecified: Secondary | ICD-10-CM

## 2013-11-04 DIAGNOSIS — R5381 Other malaise: Secondary | ICD-10-CM

## 2013-11-04 DIAGNOSIS — D473 Essential (hemorrhagic) thrombocythemia: Secondary | ICD-10-CM

## 2013-11-04 DIAGNOSIS — IMO0001 Reserved for inherently not codable concepts without codable children: Secondary | ICD-10-CM

## 2013-11-04 MED ORDER — ACETAMINOPHEN 325 MG PO TABS
650.0000 mg | ORAL_TABLET | Freq: Four times a day (QID) | ORAL | Status: DC | PRN
  Administered 2013-11-04: 650 mg via ORAL

## 2013-11-04 MED ORDER — SODIUM CHLORIDE 0.9 % IV SOLN
1020.0000 mg | Freq: Once | INTRAVENOUS | Status: AC
  Administered 2013-11-04: 1020 mg via INTRAVENOUS
  Filled 2013-11-04: qty 34

## 2013-11-04 MED ORDER — METHYLPREDNISOLONE SODIUM SUCC 40 MG IJ SOLR
40.0000 mg | Freq: Once | INTRAMUSCULAR | Status: AC
  Administered 2013-11-04: 40 mg via INTRAVENOUS

## 2013-11-04 MED ORDER — METHYLPREDNISOLONE SODIUM SUCC 40 MG IJ SOLR
INTRAMUSCULAR | Status: AC
  Filled 2013-11-04: qty 1

## 2013-11-04 MED ORDER — ACETAMINOPHEN 325 MG PO TABS
ORAL_TABLET | ORAL | Status: AC
  Filled 2013-11-04: qty 2

## 2013-11-04 MED ORDER — METHYLPREDNISOLONE SODIUM SUCC 125 MG IJ SOLR
INTRAMUSCULAR | Status: AC
  Filled 2013-11-04: qty 2

## 2013-11-04 MED ORDER — DIPHENHYDRAMINE HCL 25 MG PO TABS
25.0000 mg | ORAL_TABLET | Freq: Every evening | ORAL | Status: DC | PRN
  Administered 2013-11-04: 25 mg via ORAL
  Filled 2013-11-04: qty 1

## 2013-11-04 MED ORDER — SODIUM CHLORIDE 0.9 % IV SOLN
Freq: Once | INTRAVENOUS | Status: AC
  Administered 2013-11-04: 12:00:00 via INTRAVENOUS

## 2013-11-04 MED ORDER — DIPHENHYDRAMINE HCL 25 MG PO CAPS
ORAL_CAPSULE | ORAL | Status: AC
  Filled 2013-11-04: qty 1

## 2013-11-04 MED ORDER — SODIUM CHLORIDE 0.9 % IV SOLN
Freq: Once | INTRAVENOUS | Status: AC
  Administered 2013-11-04: 14:00:00 via INTRAVENOUS

## 2013-11-04 NOTE — Progress Notes (Signed)
Dr. Alvy Bimler notified BP 101/39 and repeat 97/44 after Feraheme. Patient denying any other signs of reaction with the exception she feels like she "could fall asleep at any moment." pt did get benadryl premed. Patient to receive 500 cc NS bolus to achieve DBP >100 prior to discharge home. Patient informed and verbalizes understanding of this plan.

## 2013-11-04 NOTE — Progress Notes (Signed)
[facility] FOLLOW-UP progress notes  Patient Care Team: [name], MD as PCP - General Ena Dawley, MD as Consulting Physician (Obstetrics and Gynecology)  CHIEF COMPLAINTS/PURPOSE OF VISIT:  Chronic microcytic anemia with history of iron deficiency  HISTORY OF PRESENTING ILLNESS:  Cheyenne Arias 38 y.o. female was transferred to my care after her prior physician has left.  I reviewed the patient's records extensive and collaborated the history with the patient. Summary of her history is as follows: This patient have chronic fatigue, fibromyalgia, uterine fibroids with chronic menorrhagia, severe iron deficiency and reactive thrombocytosis. She had chronic microcytic anemia for many years. In fact, there were no changes with MCV and the highest hemoglobin was 10.7. She had received intermittent intravenous iron infusion in the past, her last infusion was in January 2014. The patient could not tolerate oral iron supplements. She have chronic fatigue with fibromyalgia, obstructive sleep apnea and have been oxygen therapy for 2 years. She takes regular ibuprofen for pelvic pain. The patient had history of intermittent epistaxis for many years, usually coming from the left nasal passage.  MEDICAL HISTORY:  Past Medical History  Diagnosis Date  . Hypertension   . Migraine   . Anxiety   . Abnormal Pap smear   . Anemia   . Fibroids   . Fibromyalgia     degenerative disc disease  . Depression   . Sleep apnea   . Shortness of breath   . On home oxygen therapy     2 liters [place]  . PONV (postoperative nausea and vomiting)   . Low iron     pt states low iron-for Feraheme today in [facility]  . Morbid obesity with BMI of 45.0-49.9, adult   . Asthma     recent admission with exacerbation of asthma 09/15/11  . Iron deficiency anemia 04/24/2012  . Microcytic anemia 11/04/2013  . Unspecified deficiency anemia 11/04/2013    SURGICAL HISTORY: Past Surgical History   Procedure Laterality Date  . Wisdom tooth extraction    . Mandible surgery  2008  . Carpal tunnel release    . Cesarean section      1994, 1996   . Cesarean section  11/28/2011    Procedure: CESAREAN SECTION;  Surgeon: Woodroe Mode, MD;  Location: [facility];  Service: Obstetrics;  Laterality: N/A;    SOCIAL HISTORY: History   Social History  . Marital Status: Single    Spouse Name: N/A    Number of Children: N/A  . Years of Education: N/A   Occupational History  . Not on file.   Social History Main Topics  . Smoking status: Never Smoker   . Smokeless tobacco: Never Used     Comment: Pt was exposed to 2nd hand smoke at work years ago.  . Alcohol Use: No  . Drug Use: No  . Sexual Activity: Yes    Birth Control/ Protection: None     Comment: 2 months ago   Other Topics Concern  . Not on file   Social History Narrative  . No narrative on file    FAMILY HISTORY: Family History  Problem Relation Age of Onset  . Sickle cell trait Maternal Aunt   . Sickle cell anemia Other     ALLERGIES:  is allergic to latex and morphine and related.  MEDICATIONS:  Current Outpatient Prescriptions  Medication Sig Dispense Refill  . albuterol (PROVENTIL HFA;VENTOLIN HFA) 108 (90 BASE) MCG/ACT inhaler Inhale 2 puffs into the  lungs every 6 (six) hours as needed for wheezing or shortness of breath.  8.5 g  5  . amLODipine (NORVASC) 10 MG tablet Take 10 mg by mouth daily.      . budesonide (PULMICORT) 0.5 MG/2ML nebulizer solution 1 vial twice a day Dx 493.90  120 mL  5  . clonazePAM (KLONOPIN) 0.5 MG tablet Take 0.5 mg by mouth 2 (two) times daily as needed for anxiety.       . cyclobenzaprine (FLEXERIL) 10 MG tablet Take 10 mg by mouth 3 (three) times daily as needed. For muscle spasms      . FLUoxetine (PROZAC) 20 MG tablet Take 20 mg by mouth daily.      . fluticasone (FLONASE) 50 MCG/ACT nasal spray Place 2 sprays into both nostrils 2 (two) times daily.  16 g  5  . furosemide  (LASIX) 40 MG tablet Take 1 tablet (40 mg total) by mouth daily.  10 tablet  1  . ibuprofen (ADVIL,MOTRIN) 800 MG tablet Take 800 mg by mouth 3 (three) times daily.      [name] ipratropium-albuterol (DUONEB) 0.5-2.5 (3) MG/3ML SOLN Take 3 mLs by nebulization every 4 (four) hours as needed (for wheezing).  360 mL  5  . loratadine (CLARITIN) 10 MG tablet Take 1 tablet (10 mg total) by mouth daily.  31 tablet  12  . metoprolol tartrate (LOPRESSOR) 25 MG tablet Take 1 tablet (25 mg total) by mouth 2 (two) times daily.  360 tablet  6  . montelukast (SINGULAIR) 10 MG tablet Take 1 tablet (10 mg total) by mouth at bedtime.  30 tablet  5  . Multiple Vitamins-Minerals (MULTIVITAMIN WITH MINERALS) tablet Take 1 tablet by mouth daily.      . Oxycodone HCl 10 MG TABS Take 10 mg by mouth 3 (three) times daily.      . OXYGEN-HELIUM IN by Other route. Uses 2 Liters of Oxygen Continuously      . pantoprazole (PROTONIX) 40 MG tablet Take 1 tablet (40 mg total) by mouth daily.  30 tablet  5  . potassium chloride SA (K-DUR,KLOR-CON) 20 MEQ tablet Take 1 tablet (20 mEq total) by mouth daily.  10 tablet  1  . predniSONE (DELTASONE) 5 MG tablet 1.5 mg daily for 2 weeks, then 1 pill daily for 2 weeks, then 1/2 pill daily  60 tablet  1  . pregabalin (LYRICA) 75 MG capsule Take 75 mg by mouth 2 (two) times daily.      . rizatriptan (MAXALT) 10 MG tablet Take 10 mg by mouth daily as needed. May repeat in 2 hours if needed      . sennosides-docusate sodium (SENOKOT-S) 8.6-50 MG tablet Take 2 tablets by mouth daily.      [name] topiramate (TOPAMAX) 25 MG tablet Take 75 mg by mouth 2 (two) times daily.      [name] zolpidem (AMBIEN) 5 MG tablet Take 5 mg by mouth at bedtime as needed for sleep.        No current facility-administered medications for this visit.   Facility-Administered Medications Ordered in Other Visits  Medication Dose Route Frequency Provider Last Rate Last Dose  . acetaminophen (TYLENOL) tablet 650 mg  650 mg Oral Q6H  PRN Heath Lark, MD   650 mg at 11/04/13 1228  . diphenhydrAMINE (BENADRYL) tablet 25 mg  25 mg Oral QHS PRN Heath Lark, MD   25 mg at 11/04/13 1228    REVIEW OF SYSTEMS:   Constitutional: Denies  fevers, chills or abnormal night sweats Eyes: Denies blurriness of vision, double vision or watery eyes Ears, nose, mouth, throat, and face: Denies mucositis or sore throat Respiratory: Denies cough, dyspnea or wheezes Cardiovascular: Denies palpitation, chest discomfort or lower extremity swelling Gastrointestinal:  Denies nausea, heartburn or change in bowel habits Skin: Denies abnormal skin rashes Lymphatics: Denies new lymphadenopathy or easy bruising Neurological:Denies numbness, tingling or new weaknesses Behavioral/Psych: Mood is stable, no new changes  All other systems were reviewed with the patient and are negative.  PHYSICAL EXAMINATION: ECOG PERFORMANCE STATUS: 2 - Symptomatic, <50% confined to bed  Filed Vitals:   11/04/13 1142  BP: 98/58  Pulse: 97  Temp:   Resp:    Filed Weights   11/04/13 1120  Weight: 287 lb 6.4 oz (130.364 kg)    GENERAL:alert, no distress and comfortable. She is morbidly obese SKIN: skin color, texture, turgor are normal, no rashes or significant lesions EYES: normal, conjunctiva are pale and non-injected, sclera clear Musculoskeletal:no cyanosis of digits and no clubbing  PSYCH: alert & oriented x 3 with fluent speech NEURO: no focal motor/sensory deficits  LABORATORY DATA:  I have reviewed the data as listed Lab Results  Component Value Date   WBC 11.3* 11/03/2013   HGB 9.1* 11/03/2013   HCT 30.4* 11/03/2013   MCV 63.4* 11/03/2013   PLT 485* 11/03/2013    Recent Labs  02/21/13 1550 05/27/13 2054  NA 140 139  K 3.4* 3.1*  CL 105 100  CO2 24 28  GLUCOSE 103* 88  BUN 8 10  CREATININE 0.70 0.70  CALCIUM 9.5 9.0  GFRNONAA >90 >90  GFRAA >90 >90  PROT  --  7.6  ALBUMIN  --  3.7  AST  --  14  ALT  --  17  ALKPHOS  --  78  BILITOT   --  0.2*   ASSESSMENT & PLAN:  Microcytic anemia I suspect the patient may have a combination of thalassemia and iron deficiency anemia. The height is hemoglobin is 10.7 with MCV of 75. I will order hemoglobinopathy evaluation with her next visit. I will proceed with intravenous iron infusion due to symptomatic anemia and presence of iron deficiency. The most likely cause of her anemia is due to chronic blood loss. We discussed some of the risks, benefits, and alternatives of intravenous iron infusions. The patient is symptomatic from anemia and the iron level is critically low. She tolerated oral iron supplement poorly and desires to achieved higher levels of iron faster for adequate hematopoesis. Some of the side-effects to be expected including risks of infusion reactions, phlebitis, headaches, nausea and fatigue.  The patient is willing to proceed. Patient education material was dispensed.  Goal is to keep ferritin level greater than 100. Due to history of reaction, we'll premedicate her with Benadryl, Tylenol and Solu-Medrol. I recommend she takes prenatal vitamin. I will check serum vitamin B12 with her next visit.   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 Hopefully it will improve with iron infusion.  Epistaxis She may benefit from ENT consultation in the future. Right now, I recommend the patient to reduce the intake of ibuprofen that could cause risk of increased bleeding tendency.     Orders Placed This Encounter  Procedures  . CBC & Diff and Retic    Standing Status: Future     Number of Occurrences:      Standing Expiration Date: 11/04/2014  . Ferritin    Standing Status:  Future     Number of Occurrences:      Standing Expiration Date: 11/04/2014  . Iron and TIBC    Standing Status: Future     Number of Occurrences:      Standing Expiration Date: 11/04/2014  . Hemoglobinopathy evaluation    Standing Status: Future     Number  of Occurrences:      Standing Expiration Date: 11/04/2014  . Vitamin B12    Standing Status: Future     Number of Occurrences:      Standing Expiration Date: 11/04/2014    All questions were answered. The patient knows to call the clinic with any problems, questions or concerns. I spent 25 minutes counseling the patient face to face. The total time spent in the appointment was 30 minutes and more than 50% was on counseling.     [facility], [place], MD 11/04/2013 1:29 PM

## 2013-11-04 NOTE — Assessment & Plan Note (Signed)
She may benefit from ENT consultation in the future. Right now, I recommend the patient to reduce the intake of ibuprofen that could cause risk of increased bleeding tendency.

## 2013-11-04 NOTE — Assessment & Plan Note (Addendum)
I suspect the patient may have a combination of thalassemia and iron deficiency anemia. The height is hemoglobin is 10.7 with MCV of 75. I will order hemoglobinopathy evaluation with her next visit. I will proceed with intravenous iron infusion due to symptomatic anemia and presence of iron deficiency. The most likely cause of her anemia is due to chronic blood loss. We discussed some of the risks, benefits, and alternatives of intravenous iron infusions. The patient is symptomatic from anemia and the iron level is critically low. She tolerated oral iron supplement poorly and desires to achieved higher levels of iron faster for adequate hematopoesis. Some of the side-effects to be expected including risks of infusion reactions, phlebitis, headaches, nausea and fatigue.  The patient is willing to proceed. Patient education material was dispensed.  Goal is to keep ferritin level greater than 100. Due to history of reaction, we'll premedicate her with Benadryl, Tylenol and Solu-Medrol. I recommend she takes prenatal vitamin. I will check serum vitamin B12 with her next visit.

## 2013-11-04 NOTE — Patient Instructions (Signed)

## 2013-11-04 NOTE — Assessment & Plan Note (Signed)
She has reactive thrombocytosis due to iron deficiency. Hopefully it will improve with iron infusion.

## 2013-11-05 ENCOUNTER — Ambulatory Visit (INDEPENDENT_AMBULATORY_CARE_PROVIDER_SITE_OTHER): Payer: Medicaid Other | Admitting: Pulmonary Disease

## 2013-11-05 ENCOUNTER — Telehealth: Payer: Self-pay | Admitting: Hematology and Oncology

## 2013-11-05 ENCOUNTER — Encounter: Payer: Self-pay | Admitting: Pulmonary Disease

## 2013-11-05 VITALS — BP 120/80 | HR 108 | Temp 98.5°F | Wt 288.0 lb

## 2013-11-05 DIAGNOSIS — Z9989 Dependence on other enabling machines and devices: Principal | ICD-10-CM

## 2013-11-05 DIAGNOSIS — R0902 Hypoxemia: Secondary | ICD-10-CM

## 2013-11-05 DIAGNOSIS — J9611 Chronic respiratory failure with hypoxia: Secondary | ICD-10-CM

## 2013-11-05 DIAGNOSIS — J45909 Unspecified asthma, uncomplicated: Secondary | ICD-10-CM

## 2013-11-05 DIAGNOSIS — B370 Candidal stomatitis: Secondary | ICD-10-CM

## 2013-11-05 DIAGNOSIS — G4733 Obstructive sleep apnea (adult) (pediatric): Secondary | ICD-10-CM

## 2013-11-05 DIAGNOSIS — J329 Chronic sinusitis, unspecified: Secondary | ICD-10-CM

## 2013-11-05 DIAGNOSIS — IMO0002 Reserved for concepts with insufficient information to code with codable children: Secondary | ICD-10-CM

## 2013-11-05 DIAGNOSIS — J9610 Chronic respiratory failure, unspecified whether with hypoxia or hypercapnia: Secondary | ICD-10-CM

## 2013-11-05 MED ORDER — PREDNISONE 2.5 MG PO TABS: ORAL_TABLET | ORAL | Status: DC

## 2013-11-05 MED ORDER — NYSTATIN 100000 UNIT/ML MT SUSP: 500000.0000 [IU] | Freq: Four times a day (QID) | OROMUCOSAL | Status: DC

## 2013-11-05 NOTE — Progress Notes (Signed)
Chief Complaint  Patient presents with  . Follow-up    Pt reports she turned in her CPAP SD card in Dec 2014 to [facility], never received another chip to put back in machine. Wants to know if we received her download. Pt reports falling sleep during the daytime-- c/o excessive sleepiness. Pt reports that she needs her pressure changed. Recently dx with Alpha Thalassemia    History of Present Illness: Cheyenne Arias is a 38 y.o. female with asthma, OSA, OHS.  She is down to 5 mg prednisone per day.  She is not having as much cough.  She is not coughing up plugs anymore.  She gets wheezing sometimes when she exerts herself.  She uses duoneb twice per day.  She has been using CPAP and this helps.  She has noticed feeling more sleepy during the day.  She uses her oxygen when she is walking and with CPAP at night.  She will take a break from her oxygen when she is sitting during the day.  She is followed by hematology, and was told she could have alpha thalassemia.  She has noticed some build up in her throat with hoarseness.  Tests: PSG 03/27/11 >> AHI 10.9 Echo 09/20/11 > >mod LVH, EF 65 to 70%, small/mod pericardial effusion  Doppler legs 09/21/11 >> no DVT CT chest 09/22/11 >> no PE, Lt base ATX/ASD, borderline cardiomegaly Spirometry 01/10/12 >> FEV1 1.72 (71%), FEV1% 77 CT sinus 02/28/12 >> clear paranasal sinuses RAST 02/28/12 >> IgE 15.6, moderate reaction to [facility] CT chest 04/03/12 >> ASD superior segment LLL, RLL ATX PFT 12/17/12 >> FEV1 2.14 (95%), FEV1% 88, TLC 2.62 (58%), DLCO 96%, no BD CPAP 03/01/13 to 03/30/13 >> Used on 18 of 30 nights with average 5 hrs 58 minutes.  Average AHI 0.7 with CPAP 10 cm H2O.  PMHx, PSHx, Medications, Allergies, Fhx, Shx reviewed.   Physical Exam:  General - no distress, moon facies, wearing oxygen HEENT - no sinus tenderness, white exudate posterior pharynx, no LAN, MP 2, 3+ tonsil Cardiac - s1s2 regular, no murmur Chest - no wheeze/rales Abd  - obese, soft, non-tender Ext - no edema Neuro - normal strength Psych - normal mood behavior   Assessment/Plan:  Chesley Mires, MD [facility]/Critical Care/Sleep Pager:  [phone] 11/05/2013, 4:49 PM

## 2013-11-05 NOTE — Telephone Encounter (Signed)
S/w pt gave appts for lab 8/21 and md on 8/24. Mailed appt calendar.

## 2013-11-05 NOTE — Patient Instructions (Signed)
Prednisone 2.5 mg pill >> 1 pill daily for 2 weeks, then 1 pill every other day for 2 weeks, then stop prednisone Nystatin 5 ml swish and swallow 4 times daily for 5 days Will get updated CPAP report and call with results Follow up in 4 months

## 2013-11-06 ENCOUNTER — Other Ambulatory Visit: Payer: Self-pay | Admitting: Pulmonary Disease

## 2013-11-06 NOTE — Telephone Encounter (Signed)
Received fax from [place] for PA on pt's Budesonide 0.5mg /46mL susp Called [facility] @ [phone] and spoke with Darylene Price in the pharmacy dept Per Darylene Price, the brand name Pulmicort Respules are covered at 0.25mg  and 0.5mg  - call ref # [id number]  Called [facility] and spoke with pharmacist Cristie Hem who reported that "someone there had already figured it out" and nothing further is needed.  Will sign off.

## 2013-11-09 DIAGNOSIS — B370 Candidal stomatitis: Secondary | ICD-10-CM | POA: Insufficient documentation

## 2013-11-09 NOTE — Assessment & Plan Note (Signed)
Related to obesity/hypoventilation.  She is to continue supplemental oxygen.

## 2013-11-09 NOTE — Assessment & Plan Note (Signed)
Will have her use nystatin.  Re-emphasized importance of rinsing mouth after using pulmicort.

## 2013-11-09 NOTE — Assessment & Plan Note (Signed)
Stable.  Continue singulair, claritin, and flonase.

## 2013-11-09 NOTE — Assessment & Plan Note (Signed)
She has been relatively stable over last few visits.  Will gradually decrease her prednisone dose further as tolerated.    She is to continue pulmicort, singulair, and prn duoneb/albuterol

## 2013-11-09 NOTE — Assessment & Plan Note (Signed)
She reports having more daytime sleepiness.  She reports compliance with her CPAP.  Will get copy of her CPAP download and then decide if adjustments to her set up are needed.  Also explained how some of her medications could be contributing to her daytime sleepiness (ie topamax, lyrica, oxycodone, flexeril, klonopin).

## 2013-11-30 ENCOUNTER — Ambulatory Visit: Payer: Medicaid Other | Admitting: Pulmonary Disease

## 2013-12-04 ENCOUNTER — Other Ambulatory Visit (HOSPITAL_BASED_OUTPATIENT_CLINIC_OR_DEPARTMENT_OTHER): Payer: Medicaid Other

## 2013-12-04 ENCOUNTER — Ambulatory Visit: Payer: Medicaid Other | Admitting: Hematology and Oncology

## 2013-12-04 DIAGNOSIS — D539 Nutritional anemia, unspecified: Secondary | ICD-10-CM

## 2013-12-04 DIAGNOSIS — D509 Iron deficiency anemia, unspecified: Secondary | ICD-10-CM

## 2013-12-04 LAB — CBC & DIFF AND RETIC
BASO%: 0.2 % (ref 0.0–2.0)
Basophils Absolute: 0 10*3/uL (ref 0.0–0.1)
EOS ABS: 0 10*3/uL (ref 0.0–0.5)
EOS%: 0.1 % (ref 0.0–7.0)
HCT: 37 % (ref 34.8–46.6)
HEMOGLOBIN: 11.4 g/dL — AB (ref 11.6–15.9)
IMMATURE RETIC FRACT: 12.7 % — AB (ref 1.60–10.00)
LYMPH%: 9.8 % — AB (ref 14.0–49.7)
MCH: 22.2 pg — ABNORMAL LOW (ref 25.1–34.0)
MCHC: 30.8 g/dL — ABNORMAL LOW (ref 31.5–36.0)
MCV: 72 fL — ABNORMAL LOW (ref 79.5–101.0)
MONO#: 0.2 10*3/uL (ref 0.1–0.9)
MONO%: 2.1 % (ref 0.0–14.0)
NEUT%: 87.8 % — ABNORMAL HIGH (ref 38.4–76.8)
NEUTROS ABS: 9.7 10*3/uL — AB (ref 1.5–6.5)
PLATELETS: 386 10*3/uL (ref 145–400)
RBC: 5.14 10*6/uL (ref 3.70–5.45)
RDW: 24.9 % — ABNORMAL HIGH (ref 11.2–14.5)
RETIC %: 0.7 % (ref 0.70–2.10)
Retic Ct Abs: 35.98 10*3/uL (ref 33.70–90.70)
WBC: 11 10*3/uL — ABNORMAL HIGH (ref 3.9–10.3)
lymph#: 1.1 10*3/uL (ref 0.9–3.3)
nRBC: 0 % (ref 0–0)

## 2013-12-04 LAB — IRON AND TIBC CHCC
%SAT: 9 % — ABNORMAL LOW (ref 21–57)
Iron: 30 ug/dL — ABNORMAL LOW (ref 41–142)
TIBC: 316 ug/dL (ref 236–444)
UIBC: 286 ug/dL (ref 120–384)

## 2013-12-04 LAB — FERRITIN CHCC: Ferritin: 289 ng/ml — ABNORMAL HIGH (ref 9–269)

## 2013-12-07 ENCOUNTER — Ambulatory Visit (HOSPITAL_BASED_OUTPATIENT_CLINIC_OR_DEPARTMENT_OTHER): Payer: Medicaid Other | Admitting: Hematology and Oncology

## 2013-12-07 ENCOUNTER — Encounter: Payer: Self-pay | Admitting: Hematology and Oncology

## 2013-12-07 VITALS — BP 135/73 | HR 105 | Temp 98.7°F | Resp 25 | Ht 62.0 in | Wt 296.3 lb

## 2013-12-07 DIAGNOSIS — D509 Iron deficiency anemia, unspecified: Secondary | ICD-10-CM

## 2013-12-07 DIAGNOSIS — R040 Epistaxis: Secondary | ICD-10-CM

## 2013-12-07 DIAGNOSIS — R7989 Other specified abnormal findings of blood chemistry: Secondary | ICD-10-CM

## 2013-12-07 DIAGNOSIS — R0902 Hypoxemia: Secondary | ICD-10-CM

## 2013-12-07 DIAGNOSIS — R591 Generalized enlarged lymph nodes: Secondary | ICD-10-CM

## 2013-12-07 DIAGNOSIS — R599 Enlarged lymph nodes, unspecified: Secondary | ICD-10-CM

## 2013-12-07 DIAGNOSIS — J9610 Chronic respiratory failure, unspecified whether with hypoxia or hypercapnia: Secondary | ICD-10-CM

## 2013-12-07 DIAGNOSIS — D75838 Other thrombocytosis: Secondary | ICD-10-CM

## 2013-12-07 DIAGNOSIS — J9611 Chronic respiratory failure with hypoxia: Secondary | ICD-10-CM

## 2013-12-07 NOTE — Assessment & Plan Note (Signed)
She will continue followup with ENT.

## 2013-12-07 NOTE — Assessment & Plan Note (Signed)
This has resolved.

## 2013-12-07 NOTE — Assessment & Plan Note (Signed)
She will continue oxygen therapy as directed by her pulmonologist.

## 2013-12-07 NOTE — Assessment & Plan Note (Signed)
I suspect the patient may have a combination of thalassemia, anemia chronic disease and iron deficiency anemia. Despite high ferritin level, her anemia did not correct itself. Hemoglobinopathy evaluation is pending. I recommend she continues taking vitamin B12 supplements. I plan to recheck her blood work in 3 months.

## 2013-12-07 NOTE — Assessment & Plan Note (Signed)
The lymph node is palpable, concern for infection versus lymphoma. I recommend urgent ENT evaluation.

## 2013-12-07 NOTE — Progress Notes (Signed)
[place] NOTE  [name], MD SUMMARY OF HEMATOLOGIC HISTORY: This patient have chronic fatigue, fibromyalgia, uterine fibroids with chronic menorrhagia, severe iron deficiency and reactive thrombocytosis. Cheyenne Arias had chronic microcytic anemia for many years. In fact, there were no changes with MCV and the highest hemoglobin was 10.7. Cheyenne Arias had received intermittent intravenous iron infusion in the past, her last infusion was in January 2014. The patient could not tolerate oral iron supplements. Cheyenne Arias have chronic fatigue with fibromyalgia, obstructive sleep apnea and have been oxygen therapy for 2 years. Cheyenne Arias takes regular ibuprofen for pelvic pain. The patient had history of intermittent epistaxis for many years, usually coming from the left nasal passage. The patient has severe microcytic anemia. Cheyenne Arias received one dose of iron infusion on 11/04/2013 INTERVAL HISTORY: Cheyenne Arias 38 y.o. female returns for further followup. Her energy level is improved although Cheyenne Arias said her shortness of breath on minimal exertion. Cheyenne Arias continues on continuous 2 L of oxygen. 3 days ago, Cheyenne Arias palpated a lump underneath the right submandibular region. It is not painful. Cheyenne Arias denies any cough or sore throat. I have reviewed the past medical history, past surgical history, social history and family history with the patient and they are unchanged from previous note.  ALLERGIES:  is allergic to latex and morphine and related.  MEDICATIONS:  Current Outpatient Prescriptions  Medication Sig Dispense Refill  . albuterol (PROVENTIL HFA;VENTOLIN HFA) 108 (90 BASE) MCG/ACT inhaler Inhale 2 puffs into the lungs every 6 (six) hours as needed for wheezing or shortness of breath.  8.5 g  5  . amLODipine (NORVASC) 10 MG tablet Take 10 mg by mouth daily.      . budesonide (PULMICORT) 0.5 MG/2ML nebulizer solution 1 vial twice a day Dx 493.90  120 mL  5  . clonazePAM (KLONOPIN) 0.5 MG  tablet Take 0.5 mg by mouth 2 (two) times daily as needed for anxiety.       . cyclobenzaprine (FLEXERIL) 10 MG tablet Take 10 mg by mouth 3 (three) times daily as needed. For muscle spasms      . ferumoxytol (FERAHEME) 510 MG/17ML SOLN injection Inject 510 mg into the vein every 3 (three) months.      [name] FLUoxetine (PROZAC) 20 MG tablet Take 20 mg by mouth daily.      . fluticasone (FLONASE) 50 MCG/ACT nasal spray Place 2 sprays into both nostrils 2 (two) times daily.  16 g  5  . furosemide (LASIX) 40 MG tablet Take 1 tablet (40 mg total) by mouth daily.  10 tablet  1  . ibuprofen (ADVIL,MOTRIN) 800 MG tablet Take 800 mg by mouth 3 (three) times daily.      [name] ipratropium-albuterol (DUONEB) 0.5-2.5 (3) MG/3ML SOLN Take 3 mLs by nebulization every 4 (four) hours as needed (for wheezing).  360 mL  5  . Lactulose 20 GM/30ML SOLN Take 15-30 mLs by mouth daily.      [name] loratadine (CLARITIN) 10 MG tablet Take 1 tablet (10 mg total) by mouth daily.  31 tablet  12  . metoprolol tartrate (LOPRESSOR) 25 MG tablet Take 1 tablet (25 mg total) by mouth 2 (two) times daily.  360 tablet  6  . montelukast (SINGULAIR) 10 MG tablet Take 1 tablet (10 mg total) by mouth at bedtime.  30 tablet  5  . Multiple Vitamins-Minerals (MULTIVITAMIN WITH MINERALS) tablet Take 1 tablet by mouth daily.      [name] nystatin (MYCOSTATIN) 100000 UNIT/ML suspension  Take 5 mLs (500,000 Units total) by mouth 4 (four) times daily.  60 mL  0  . Oxycodone HCl 10 MG TABS Take 10 mg by mouth 3 (three) times daily.      . OXYGEN-HELIUM IN by Other route. Uses 2 Liters of Oxygen Continuously      . pantoprazole (PROTONIX) 40 MG tablet Take 1 tablet (40 mg total) by mouth daily.  30 tablet  5  . potassium chloride SA (K-DUR,KLOR-CON) 20 MEQ tablet Take 20 mEq by mouth 2 (two) times daily.      . predniSONE (DELTASONE) 2.5 MG tablet 1 pill daily for 2 weeks, then 1 pill every other day for 2 weeks  21 tablet  0  . pregabalin (LYRICA) 75 MG capsule  Take 75 mg by mouth 2 (two) times daily.      . rizatriptan (MAXALT) 10 MG tablet Take 10 mg by mouth daily as needed. May repeat in 2 hours if needed      . topiramate (TOPAMAX) 25 MG tablet Take 75 mg by mouth 2 (two) times daily.      [name] zolpidem (AMBIEN) 5 MG tablet Take 5 mg by mouth at bedtime as needed for sleep.        No current facility-administered medications for this visit.     REVIEW OF SYSTEMS:   Constitutional: Denies fevers, chills or night sweats Eyes: Denies blurriness of vision Ears, nose, mouth, throat, and face: Denies mucositis or sore throat Cardiovascular: Denies palpitation, chest discomfort or lower extremity swelling Gastrointestinal:  Denies nausea, heartburn or change in bowel habits Skin: Denies abnormal skin rashes Neurological:Denies numbness, tingling or new weaknesses Behavioral/Psych: Mood is stable, no new changes  All other systems were reviewed with the patient and are negative.  PHYSICAL EXAMINATION: ECOG PERFORMANCE STATUS: 2 - Symptomatic, <50% confined to bed  Filed Vitals:   12/07/13 1127  BP: 135/73  Pulse: 105  Temp: 98.7 F (37.1 C)  Resp: 25   Filed Weights   12/07/13 1127  Weight: 296 lb 4.8 oz (134.401 kg)    GENERAL:alert, no distress and comfortable. Cheyenne Arias is morbidly obese SKIN: skin color, texture, turgor are normal, no rashes or significant lesions EYES: normal, Conjunctiva are pink and non-injected, sclera clear OROPHARYNX:no exudate, no erythema and lips, buccal mucosa, and tongue normal  NECK: supple, thyroid normal size, non-tender, without nodularity LYMPH:  Palpable lymphadenopathy in the submental region. LUNGS: clear to auscultation and percussion with normal breathing effort HEART: regular rate & rhythm and no murmurs and no lower extremity edema ABDOMEN:abdomen soft, non-tender and normal bowel sounds Musculoskeletal:no cyanosis of digits and no clubbing  NEURO: alert & oriented x 3 with fluent speech, no focal  motor/sensory deficits  LABORATORY DATA:  I have reviewed the data as listed No results found for this or any previous visit (from the past 48 hour(s)).  Lab Results  Component Value Date   WBC 11.0* 12/04/2013   HGB 11.4* 12/04/2013   HCT 37.0 12/04/2013   MCV 72.0* 12/04/2013   PLT 386 12/04/2013   ASSESSMENT & PLAN:  Microcytic anemia I suspect the patient may have a combination of thalassemia, anemia chronic disease and iron deficiency anemia. Despite high ferritin level, her anemia did not correct itself. Hemoglobinopathy evaluation is pending. I recommend Cheyenne Arias continues taking vitamin B12 supplements. I plan to recheck her blood work in 3 months.  Reactive thrombocytosis This has resolved.  Epistaxis Cheyenne Arias will continue followup with ENT.  Chronic respiratory failure  with hypoxia Cheyenne Arias will continue oxygen therapy as directed by her pulmonologist.  Lymphadenopathy of head and neck The lymph node is palpable, concern for infection versus lymphoma. I recommend urgent ENT evaluation.    All questions were answered. The patient knows to call the clinic with any problems, questions or concerns. No barriers to learning was detected.  I spent 15 minutes counseling the patient face to face. The total time spent in the appointment was 20 minutes and more than 50% was on counseling.     Gordon, [place], MD 12/07/2013 1:19 PM

## 2013-12-08 ENCOUNTER — Telehealth: Payer: Self-pay | Admitting: Medical Oncology

## 2013-12-08 ENCOUNTER — Telehealth: Payer: Self-pay | Admitting: Hematology and Oncology

## 2013-12-08 LAB — HEMOGLOBINOPATHY EVALUATION
HGB F QUANT: 0 % (ref 0.0–2.0)
HGB S QUANTITAION: 0 %
Hemoglobin Other: 0 %
Hgb A2 Quant: 2.1 % — ABNORMAL LOW (ref 2.2–3.2)
Hgb A: 97.9 % — ABNORMAL HIGH (ref 96.8–97.8)

## 2013-12-08 LAB — VITAMIN B12: VITAMIN B 12: 351 pg/mL (ref 211–911)

## 2013-12-08 NOTE — Telephone Encounter (Signed)
Second attempt to call pt with lab results. Mssg states VM full, left number on pager system for patient to return call.

## 2013-12-08 NOTE — Telephone Encounter (Signed)
Cheyenne Arias and advised on NOV and Dec....mailed Arias appt sched/letter and avs

## 2013-12-08 NOTE — Telephone Encounter (Signed)
Attempted to call patient to inform of labs, message on VM states mail box full.  Will attempt again.

## 2013-12-08 NOTE — Telephone Encounter (Signed)
Message copied by Maxwell Marion on Tue Dec 08, 2013  3:07 PM ------      Message from: [facility], [place]: Tue Dec 08, 2013  2:12 PM      Regarding: hemoglobinopathy result       Pls let her know she does not have thalassemia      ----- Message -----         From: [facility] Interface         Sent: 12/04/2013  12:19 PM           To: Heath Lark, MD                   ------

## 2014-01-06 ENCOUNTER — Emergency Department (HOSPITAL_BASED_OUTPATIENT_CLINIC_OR_DEPARTMENT_OTHER)
Admission: EM | Admit: 2014-01-06 | Discharge: 2014-01-06 | Disposition: A | Discharge status: Home / Self Care | Payer: Medicaid Other | Attending: Emergency Medicine | Admitting: Emergency Medicine

## 2014-01-06 ENCOUNTER — Emergency Department (HOSPITAL_BASED_OUTPATIENT_CLINIC_OR_DEPARTMENT_OTHER): Payer: Medicaid Other

## 2014-01-06 ENCOUNTER — Encounter (HOSPITAL_BASED_OUTPATIENT_CLINIC_OR_DEPARTMENT_OTHER): Payer: Self-pay | Admitting: Emergency Medicine

## 2014-01-06 DIAGNOSIS — Z6841 Body Mass Index (BMI) 40.0 and over, adult: Secondary | ICD-10-CM | POA: Diagnosis not present

## 2014-01-06 DIAGNOSIS — R609 Edema, unspecified: Secondary | ICD-10-CM | POA: Diagnosis not present

## 2014-01-06 DIAGNOSIS — E669 Obesity, unspecified: Secondary | ICD-10-CM | POA: Insufficient documentation

## 2014-01-06 DIAGNOSIS — M545 Low back pain, unspecified: Secondary | ICD-10-CM | POA: Diagnosis not present

## 2014-01-06 DIAGNOSIS — Z91040 Latex allergy status: Secondary | ICD-10-CM | POA: Diagnosis not present

## 2014-01-06 DIAGNOSIS — Z8742 Personal history of other diseases of the female genital tract: Secondary | ICD-10-CM | POA: Diagnosis not present

## 2014-01-06 DIAGNOSIS — R079 Chest pain, unspecified: Secondary | ICD-10-CM | POA: Insufficient documentation

## 2014-01-06 DIAGNOSIS — I10 Essential (primary) hypertension: Secondary | ICD-10-CM | POA: Insufficient documentation

## 2014-01-06 DIAGNOSIS — D509 Iron deficiency anemia, unspecified: Secondary | ICD-10-CM | POA: Insufficient documentation

## 2014-01-06 DIAGNOSIS — G4730 Sleep apnea, unspecified: Secondary | ICD-10-CM | POA: Diagnosis not present

## 2014-01-06 DIAGNOSIS — F329 Major depressive disorder, single episode, unspecified: Secondary | ICD-10-CM | POA: Insufficient documentation

## 2014-01-06 DIAGNOSIS — Z791 Long term (current) use of non-steroidal anti-inflammatories (NSAID): Secondary | ICD-10-CM | POA: Insufficient documentation

## 2014-01-06 DIAGNOSIS — Z9981 Dependence on supplemental oxygen: Secondary | ICD-10-CM | POA: Insufficient documentation

## 2014-01-06 DIAGNOSIS — R600 Localized edema: Secondary | ICD-10-CM

## 2014-01-06 DIAGNOSIS — F3289 Other specified depressive episodes: Secondary | ICD-10-CM | POA: Insufficient documentation

## 2014-01-06 DIAGNOSIS — M5440 Lumbago with sciatica, unspecified side: Secondary | ICD-10-CM

## 2014-01-06 DIAGNOSIS — IMO0002 Reserved for concepts with insufficient information to code with codable children: Secondary | ICD-10-CM | POA: Diagnosis not present

## 2014-01-06 DIAGNOSIS — Z79899 Other long term (current) drug therapy: Secondary | ICD-10-CM | POA: Diagnosis not present

## 2014-01-06 DIAGNOSIS — G43909 Migraine, unspecified, not intractable, without status migrainosus: Secondary | ICD-10-CM | POA: Insufficient documentation

## 2014-01-06 DIAGNOSIS — IMO0001 Reserved for inherently not codable concepts without codable children: Secondary | ICD-10-CM | POA: Diagnosis not present

## 2014-01-06 DIAGNOSIS — J45901 Unspecified asthma with (acute) exacerbation: Secondary | ICD-10-CM | POA: Diagnosis not present

## 2014-01-06 DIAGNOSIS — F411 Generalized anxiety disorder: Secondary | ICD-10-CM | POA: Insufficient documentation

## 2014-01-06 LAB — COMPREHENSIVE METABOLIC PANEL
ALBUMIN: 3.8 g/dL (ref 3.5–5.2)
ALK PHOS: 90 U/L (ref 39–117)
ALT: 30 U/L (ref 0–35)
AST: 39 U/L — AB (ref 0–37)
Anion gap: 14 (ref 5–15)
BUN: 14 mg/dL (ref 6–23)
CO2: 26 mEq/L (ref 19–32)
Calcium: 9.2 mg/dL (ref 8.4–10.5)
Chloride: 99 mEq/L (ref 96–112)
Creatinine, Ser: 1.3 mg/dL — ABNORMAL HIGH (ref 0.50–1.10)
GFR calc Af Amer: 59 mL/min — ABNORMAL LOW (ref >90)
GFR calc non Af Amer: 51 mL/min — ABNORMAL LOW (ref >90)
Glucose, Bld: 129 mg/dL — ABNORMAL HIGH (ref 70–99)
POTASSIUM: 4 meq/L (ref 3.7–5.3)
SODIUM: 139 meq/L (ref 137–147)
Total Bilirubin: <0.2 mg/dL — ABNORMAL LOW (ref 0.3–1.2)
Total Protein: 7.7 g/dL (ref 6.0–8.3)

## 2014-01-06 LAB — CBC WITH DIFFERENTIAL/PLATELET
BASOS ABS: 0 10*3/uL (ref 0.0–0.1)
BASOS PCT: 0 % (ref 0–1)
EOS ABS: 0.5 10*3/uL (ref 0.0–0.7)
Eosinophils Relative: 5 % (ref 0–5)
HCT: 38.2 % (ref 36.0–46.0)
HEMOGLOBIN: 11.8 g/dL — AB (ref 12.0–15.0)
LYMPHS PCT: 17 % (ref 12–46)
Lymphs Abs: 1.6 10*3/uL (ref 0.7–4.0)
MCH: 23.6 pg — ABNORMAL LOW (ref 26.0–34.0)
MCHC: 30.9 g/dL (ref 30.0–36.0)
MCV: 76.4 fL — ABNORMAL LOW (ref 78.0–100.0)
MONO ABS: 0.7 10*3/uL (ref 0.1–1.0)
Monocytes Relative: 7 % (ref 3–12)
NEUTROS PCT: 71 % (ref 43–77)
Neutro Abs: 6.6 10*3/uL (ref 1.7–7.7)
PLATELETS: 371 10*3/uL (ref 150–400)
RBC: 5 MIL/uL (ref 3.87–5.11)
RDW: 25.5 % — AB (ref 11.5–15.5)
WBC: 9.4 10*3/uL (ref 4.0–10.5)

## 2014-01-06 LAB — URINALYSIS, ROUTINE W REFLEX MICROSCOPIC
BILIRUBIN URINE: NEGATIVE
Glucose, UA: NEGATIVE mg/dL
Ketones, ur: NEGATIVE mg/dL
Leukocytes, UA: NEGATIVE
Nitrite: NEGATIVE
PH: 5.5 (ref 5.0–8.0)
Protein, ur: 30 mg/dL — AB
SPECIFIC GRAVITY, URINE: 1.02 (ref 1.005–1.030)
Urobilinogen, UA: 0.2 mg/dL (ref 0.0–1.0)

## 2014-01-06 LAB — URINE MICROSCOPIC-ADD ON

## 2014-01-06 LAB — PRO B NATRIURETIC PEPTIDE: PRO B NATRI PEPTIDE: 6.1 pg/mL (ref 0–125)

## 2014-01-06 LAB — TROPONIN I

## 2014-01-06 MED ORDER — HYDROMORPHONE HCL 1 MG/ML IJ SOLN
1.0000 mg | Freq: Once | INTRAMUSCULAR | Status: AC
  Administered 2014-01-06: 1 mg via INTRAVENOUS
  Filled 2014-01-06: qty 1

## 2014-01-06 MED ORDER — SODIUM CHLORIDE 0.9 % IV BOLUS (SEPSIS): 1000.0000 mL | Freq: Once | INTRAVENOUS | Status: DC

## 2014-01-06 NOTE — ED Notes (Signed)
Patient preparing for discharge. 

## 2014-01-06 NOTE — ED Notes (Signed)
Patient c/o back and leg pain that has grown worse over the past four days, also c/o legs swelling

## 2014-01-06 NOTE — ED Provider Notes (Signed)
CSN: [id number]     Arrival date & time 01/06/14  0701 History   First MD Initiated Contact with Patient 01/06/14 0725     Chief Complaint  Patient presents with  . Back Pain     (Consider location/radiation/quality/duration/timing/severity/associated sxs/prior Treatment) HPI Comments: 38 year old female presenting with a chief complaint of bilateral lower extremity swelling. She states swelling has gradually worsened the past 3-4 days. States severity is severe. Constant. Nothing seems to improve the swelling. Nothing seems to worsen the swelling. Furosemide has been ineffective.  She also complains, secondarily, of low back pain which is also gradually worsened for several days.  She states she has some lower extremity weakness associated with her back pain. She states this caused her to fall about a week ago. She has had increased shortness of breath, most noticeable when climbing stairs. On review of systems, she also complains of intermittent double vision for years. None now. She also complains of intermittent chest pain radiating to left arm. None now.   Past Medical History  Diagnosis Date  . Hypertension   . Migraine   . Anxiety   . Abnormal Pap smear   . Anemia   . Fibroids   . Fibromyalgia     degenerative disc disease  . Depression   . Sleep apnea   . Shortness of breath   . On home oxygen therapy     2 liters [place]  . PONV (postoperative nausea and vomiting)   . Low iron     pt states low iron-for Feraheme today in [facility]  . Morbid obesity with BMI of 45.0-49.9, adult   . Iron deficiency anemia 04/24/2012  . Microcytic anemia 11/04/2013  . Unspecified deficiency anemia 11/04/2013  . Alpha thalassemia   . Asthma     recent admission with exacerbation of asthma 09/15/11   Past Surgical History  Procedure Laterality Date  . Wisdom tooth extraction    . Mandible surgery  2008  . Carpal tunnel release    . Cesarean section      1994, 1996   . Cesarean section  11/28/2011     Procedure: CESAREAN SECTION;  Surgeon: Woodroe Mode, MD;  Location: [facility];  Service: Obstetrics;  Laterality: N/A;   Family History  Problem Relation Age of Onset  . Sickle cell trait Maternal Aunt   . Sickle cell anemia Other    History  Substance Use Topics  . Smoking status: Never Smoker   . Smokeless tobacco: Never Used     Comment: Pt was exposed to 2nd hand smoke at work years ago.  . Alcohol Use: No   OB History   Grav Para Term Preterm Abortions TAB SAB Ect Mult Living   3 3 3       3      Review of Systems  Constitutional: Negative for fever.  HENT: Negative for congestion.   Respiratory: Positive for shortness of breath. Negative for cough.   Cardiovascular: Positive for chest pain.  Gastrointestinal: Negative for nausea, vomiting, abdominal pain and diarrhea.  Musculoskeletal: Positive for back pain.  All other systems reviewed and are negative.     Allergies  Latex and Morphine and related  Home Medications   Prior to Admission medications   Medication Sig Start Date End Date Taking? Authorizing Provider  albuterol (PROVENTIL HFA;VENTOLIN HFA) 108 (90 BASE) MCG/ACT inhaler Inhale 2 puffs into the lungs every 6 (six) hours as needed for wheezing or shortness of breath. 07/29/13  Tammy S Parrett, NP  amLODipine (NORVASC) 10 MG tablet Take 10 mg by mouth daily.    Historical Provider, MD  budesonide (PULMICORT) 0.5 MG/2ML nebulizer solution 1 vial twice a day Dx 493.90 07/29/13   Tammy S Parrett, NP  clonazePAM (KLONOPIN) 0.5 MG tablet Take 0.5 mg by mouth 2 (two) times daily as needed for anxiety.     Historical Provider, MD  cyclobenzaprine (FLEXERIL) 10 MG tablet Take 10 mg by mouth 3 (three) times daily as needed. For muscle spasms    Historical Provider, MD  ferumoxytol (FERAHEME) 510 MG/17ML SOLN injection Inject 510 mg into the vein every 3 (three) months.    Historical Provider, MD  FLUoxetine (PROZAC) 20 MG tablet Take 20 mg by mouth daily.     Historical Provider, MD  fluticasone (FLONASE) 50 MCG/ACT nasal spray Place 2 sprays into both nostrils 2 (two) times daily. 07/29/13 05/30/16  Tammy S Parrett, NP  furosemide (LASIX) 40 MG tablet Take 1 tablet (40 mg total) by mouth daily. 10/25/11 07/30/14  Donnamae Jude, MD  ibuprofen (ADVIL,MOTRIN) 800 MG tablet Take 800 mg by mouth 3 (three) times daily. 02/21/13   Kaitlyn Szekalski, PA-C  ipratropium-albuterol (DUONEB) 0.5-2.5 (3) MG/3ML SOLN Take 3 mLs by nebulization every 4 (four) hours as needed (for wheezing). 07/29/13   Tammy S Parrett, NP  Lactulose 20 GM/30ML SOLN Take 15-30 mLs by mouth daily.    Historical Provider, MD  loratadine (CLARITIN) 10 MG tablet Take 1 tablet (10 mg total) by mouth daily. 08/26/13 06/27/16  Chesley Mires, MD  metoprolol tartrate (LOPRESSOR) 25 MG tablet Take 1 tablet (25 mg total) by mouth 2 (two) times daily. 09/28/11 07/30/14  Emily Filbert, MD  montelukast (SINGULAIR) 10 MG tablet Take 1 tablet (10 mg total) by mouth at bedtime. 07/29/13   Tammy S Parrett, NP  Multiple Vitamins-Minerals (MULTIVITAMIN WITH MINERALS) tablet Take 1 tablet by mouth daily.    Historical Provider, MD  nystatin (MYCOSTATIN) 100000 UNIT/ML suspension Take 5 mLs (500,000 Units total) by mouth 4 (four) times daily. 11/05/13   Chesley Mires, MD  Oxycodone HCl 10 MG TABS Take 10 mg by mouth 3 (three) times daily.    Historical Provider, MD  OXYGEN-HELIUM IN by Other route. Uses 2 Liters of Oxygen Continuously    Historical Provider, MD  pantoprazole (PROTONIX) 40 MG tablet Take 1 tablet (40 mg total) by mouth daily. 07/29/13   Tammy S Parrett, NP  potassium chloride SA (K-DUR,KLOR-CON) 20 MEQ tablet Take 20 mEq by mouth 2 (two) times daily. 10/22/11 07/30/14  Donnamae Jude, MD  predniSONE (DELTASONE) 2.5 MG tablet 1 pill daily for 2 weeks, then 1 pill every other day for 2 weeks 11/05/13   Chesley Mires, MD  pregabalin (LYRICA) 75 MG capsule Take 75 mg by mouth 2 (two) times daily.    Historical Provider, MD   rizatriptan (MAXALT) 10 MG tablet Take 10 mg by mouth daily as needed. May repeat in 2 hours if needed    Historical Provider, MD  topiramate (TOPAMAX) 25 MG tablet Take 75 mg by mouth 2 (two) times daily.    Historical Provider, MD  zolpidem (AMBIEN) 5 MG tablet Take 5 mg by mouth at bedtime as needed for sleep.     Historical Provider, MD   BP 102/41  Pulse 105  Temp(Src) 98.8 F (37.1 C) (Oral)  Resp 23  SpO2 100% Physical Exam  Nursing note and vitals reviewed. Constitutional: She is oriented to  person, place, and time. She appears well-developed and well-nourished. No distress.  Obese  HENT:  Head: Normocephalic and atraumatic.  Mouth/Throat: Oropharynx is clear and moist.  Eyes: Conjunctivae and EOM are normal. Pupils are equal, round, and reactive to light. No scleral icterus.  Neck: Neck supple.  Cardiovascular: Normal rate, regular rhythm, normal heart sounds and intact distal pulses.   No murmur heard. Pulmonary/Chest: Effort normal and breath sounds normal. No stridor. No respiratory distress. She has no wheezes. She has no rales.  Abdominal: Soft. Bowel sounds are normal. She exhibits no distension. There is no tenderness. There is no rebound and no guarding.  Musculoskeletal: Normal range of motion. She exhibits edema (1+ bilateral lower extremity ).  Neurological: She is alert and oriented to person, place, and time.  Reflex Scores:      Patellar reflexes are 1+ on the right side and 1+ on the left side. Lower extremity strength and sensation grossly intact  Skin: Skin is warm and dry. No rash noted.  Psychiatric: She has a normal mood and affect. Her behavior is normal.    ED Course  Procedures (including critical care time) Labs Review Labs Reviewed  CBC WITH DIFFERENTIAL - Abnormal; Notable for the following:    Hemoglobin 11.8 (*)    MCV 76.4 (*)    MCH 23.6 (*)    RDW 25.5 (*)    All other components within normal limits  COMPREHENSIVE METABOLIC PANEL -  Abnormal; Notable for the following:    Glucose, Bld 129 (*)    Creatinine, Ser 1.30 (*)    AST 39 (*)    Total Bilirubin <0.2 (*)    GFR calc non Af Amer 51 (*)    GFR calc Af Amer 59 (*)    All other components within normal limits  URINALYSIS, ROUTINE W REFLEX MICROSCOPIC - Abnormal; Notable for the following:    APPearance CLOUDY (*)    Hgb urine dipstick TRACE (*)    Protein, ur 30 (*)    All other components within normal limits  URINE MICROSCOPIC-ADD ON - Abnormal; Notable for the following:    Squamous Epithelial / LPF MANY (*)    Bacteria, UA MANY (*)    Crystals CA OXALATE CRYSTALS (*)    All other components within normal limits  TROPONIN I  PRO B NATRIURETIC PEPTIDE    Imaging Review Dg Chest 2 View  01/06/2014   CLINICAL DATA:  Shortness of Breath  EXAM: CHEST  2 VIEW  COMPARISON:  07/27/2013  FINDINGS: Cardiomediastinal silhouette is stable. No acute infiltrate or pleural effusion. No pulmonary edema. Bony thorax is unremarkable.  IMPRESSION: No active cardiopulmonary disease.   Electronically Signed   By: Lahoma Crocker M.D.   On: 01/06/2014 08:28  All radiology studies independently viewed by me.      EKG Interpretation   Date/Time:  Wednesday January 06 2014 07:51:08 EDT Ventricular Rate:  102 PR Interval:  148 QRS Duration: 74 QT Interval:  354 QTC Calculation: 461 R Axis:   20 Text Interpretation:  Sinus tachycardia Otherwise normal ECG No  significant change was found Confirmed by [facility]  MD, TREY ([id number]) on  01/06/2014 8:37:33 AM      MDM   Final diagnoses:  Midline low back pain with sciatica, sciatica laterality unspecified  Bilateral edema of lower extremity    38 year old female with multiple medical problems presenting with chief complaint lower extremity swelling and secondary complaint of low back pain. No bowel or  bladder dysfunction, no fevers, no perianal numbness.  She complained of her legs giving out on her about a week ago.  Initial  neuro exam was limited by pain, but repeat neuro exam showed normal lower extremity strength.  She was able to get up and ambulate.  As regards her bilateral lower extremity edema, she has no signs of heart failure.  Her kidney function is mildly decreased from prior, but not dramatically. She was given a liter of fluids, IV.at for pain.  She was advised to follow up with her primary doctor.  Houston Siren III, MD 01/06/14 [phone]

## 2014-01-06 NOTE — Discharge Instructions (Signed)
Chronic Back Pain  When back pain lasts longer than 3 months, it is called chronic back pain.People with chronic back pain often go through certain periods that are more intense (flare-ups).  CAUSES Chronic back pain can be caused by wear and tear (degeneration) on different structures in your back. These structures include:  The bones of your spine (vertebrae) and the joints surrounding your spinal cord and nerve roots (facets).  The strong, fibrous tissues that connect your vertebrae (ligaments). Degeneration of these structures may result in pressure on your nerves. This can lead to constant pain. HOME CARE INSTRUCTIONS  Avoid bending, heavy lifting, prolonged sitting, and activities which make the problem worse.  Take brief periods of rest throughout the day to reduce your pain. Lying down or standing usually is better than sitting while you are resting.  Take over-the-counter or prescription medicines only as directed by your caregiver. SEEK IMMEDIATE MEDICAL CARE IF:   You have weakness or numbness in one of your legs or feet.  You have trouble controlling your bladder or bowels.  You have nausea, vomiting, abdominal pain, shortness of breath, or fainting. Document Released: 05/10/2004 Document Revised: 06/25/2011 Document Reviewed: 03/17/2011 [facility] Patient Information 2015 [place], [place]. This information is not intended to replace advice given to you by your health care provider. Make sure you discuss any questions you have with your health care provider.  Edema Edema is an abnormal buildup of fluids in your bodytissues. Edema is somewhatdependent on gravity to pull the fluid to the lowest place in your body. That makes the condition more common in the legs and thighs (lower extremities). Painless swelling of the feet and ankles is common and becomes more likely as you get older. It is also common in looser tissues, like around your eyes.  When the affected area is  squeezed, the fluid may move out of that spot and leave a dent for a few moments. This dent is called pitting.  CAUSES  There are many possible causes of edema. Eating too much salt and being on your feet or sitting for a long time can cause edema in your legs and ankles. Hot weather may make edema worse. Common medical causes of edema include:  Heart failure.  Liver disease.  Kidney disease.  Weak blood vessels in your legs.  Cancer.  An injury.  Pregnancy.  Some medications.  Obesity. SYMPTOMS  Edema is usually painless.Your skin may look swollen or shiny.  DIAGNOSIS  Your health care provider may be able to diagnose edema by asking about your medical history and doing a physical exam. You may need to have tests such as X-rays, an electrocardiogram, or blood tests to check for medical conditions that may cause edema.  TREATMENT  Edema treatment depends on the cause. If you have heart, liver, or kidney disease, you need the treatment appropriate for these conditions. General treatment may include:  Elevation of the affected body part above the level of your heart.  Compression of the affected body part. Pressure from elastic bandages or support stockings squeezes the tissues and forces fluid back into the blood vessels. This keeps fluid from entering the tissues.  Restriction of fluid and salt intake.  Use of a water pill (diuretic). These medications are appropriate only for some types of edema. They pull fluid out of your body and make you urinate more often. This gets rid of fluid and reduces swelling, but diuretics can have side effects. Only use diuretics as directed by your  health care provider. HOME CARE INSTRUCTIONS   Keep the affected body part above the level of your heart when you are lying down.   Do not sit still or stand for prolonged periods.   Do not put anything directly under your knees when lying down.  Do not wear constricting clothing or garters on  your upper legs.   Exercise your legs to work the fluid back into your blood vessels. This may help the swelling go down.   Wear elastic bandages or support stockings to reduce ankle swelling as directed by your health care provider.   Eat a low-salt diet to reduce fluid if your health care provider recommends it.   Only take medicines as directed by your health care provider. SEEK MEDICAL CARE IF:   Your edema is not responding to treatment.  You have heart, liver, or kidney disease and notice symptoms of edema.  You have edema in your legs that does not improve after elevating them.   You have sudden and unexplained weight gain. SEEK IMMEDIATE MEDICAL CARE IF:   You develop shortness of breath or chest pain.   You cannot breathe when you lie down.  You develop pain, redness, or warmth in the swollen areas.   You have heart, liver, or kidney disease and suddenly get edema.  You have a fever and your symptoms suddenly get worse. MAKE SURE YOU:   Understand these instructions.  Will watch your condition.  Will get help right away if you are not doing well or get worse. Document Released: 04/02/2005 Document Revised: 08/17/2013 Document Reviewed: 01/23/2013 [facility] Patient Information 2015 [place], [place]. This information is not intended to replace advice given to you by your health care provider. Make sure you discuss any questions you have with your health care provider.

## 2014-01-07 ENCOUNTER — Ambulatory Visit (INDEPENDENT_AMBULATORY_CARE_PROVIDER_SITE_OTHER): Payer: Medicaid Other | Admitting: Neurology

## 2014-01-07 ENCOUNTER — Encounter: Payer: Self-pay | Admitting: Neurology

## 2014-01-07 VITALS — BP 130/82 | HR 76 | Ht 62.0 in | Wt 301.0 lb

## 2014-01-07 DIAGNOSIS — D509 Iron deficiency anemia, unspecified: Secondary | ICD-10-CM

## 2014-01-07 MED ORDER — TOPIRAMATE 100 MG PO TABS: ORAL_TABLET | ORAL | Status: DC

## 2014-01-07 MED ORDER — DICLOFENAC POTASSIUM(MIGRAINE) 50 MG PO PACK: 50.0000 mg | PACK | ORAL | Status: DC | PRN

## 2014-01-07 MED ORDER — RIZATRIPTAN BENZOATE 10 MG PO TABS: 10.0000 mg | ORAL_TABLET | Freq: Every day | ORAL | Status: DC | PRN

## 2014-01-07 NOTE — Progress Notes (Signed)
PATIENT: Cheyenne Arias DOB: 01/10/1976  HISTORICAL  Cheyenne Arias is a 38 years old right-handed African American female, followup for migraines.  She had past medical history of morbid obesity, chronic migraines, fibromyalgia,  She complains of pain all over her body, previous MRI of the brain in 2013, was normal,Stable appearance of a benign appearing focus within the clivus.  This could relate to a fibrous dysplasia and/or of bone cyst. Lack of enlargement or other change over 8 years is strongly in favor of a benign and insignificant nature.  She is now taking Topamax 25 mg 3 tablets twice a day, her headache has improved, instead of daily headaches, she is having 3-4 typical migraines each week, retro-orbital area severe pounding headaches, with associated light noise needed, most time it helped her headaches within one hour, but sometimes even with Maxalt, her headache has been going around 3-4 days, In addition over the past few months, she reported excessive weight gain of 30 pounds over 2 months, blurry vision, increase gait difficulty,  She is using home oxygen because of obstructive sleep apnea, hypoxia, obesity,since June 2013,  She is also under the care of hematologist for severe microcytic anemia, receiving periodic iron  infusion.   REVIEW OF SYSTEMS: Full 14 system review of systems performed and notable only for chills, fatigue, blurry vision, double vision,  ALLERGIES: Allergies  Allergen Reactions  . Latex Itching and Swelling    Blisters  . Morphine And Related Itching    HOME MEDICATIONS: Current Outpatient Prescriptions on File Prior to Visit  Medication Sig Dispense Refill  . albuterol (PROVENTIL HFA;VENTOLIN HFA) 108 (90 BASE) MCG/ACT inhaler Inhale 2 puffs into the lungs every 6 (six) hours as needed for wheezing or shortness of breath.  8.5 g  5  . amLODipine (NORVASC) 10 MG tablet Take 10 mg by mouth daily.      . budesonide (PULMICORT) 0.5  MG/2ML nebulizer solution 1 vial twice a day Dx 493.90  120 mL  5  . clonazePAM (KLONOPIN) 0.5 MG tablet Take 0.5 mg by mouth 2 (two) times daily as needed for anxiety.       . cyclobenzaprine (FLEXERIL) 10 MG tablet Take 10 mg by mouth 3 (three) times daily as needed. For muscle spasms      . ferumoxytol (FERAHEME) 510 MG/17ML SOLN injection Inject 510 mg into the vein every 3 (three) months.      [name] FLUoxetine (PROZAC) 20 MG tablet Take 20 mg by mouth daily.      . fluticasone (FLONASE) 50 MCG/ACT nasal spray Place 2 sprays into both nostrils 2 (two) times daily.  16 g  5  . furosemide (LASIX) 40 MG tablet Take 1 tablet (40 mg total) by mouth daily.  10 tablet  1  . ibuprofen (ADVIL,MOTRIN) 800 MG tablet Take 800 mg by mouth 3 (three) times daily.      [name] ipratropium-albuterol (DUONEB) 0.5-2.5 (3) MG/3ML SOLN Take 3 mLs by nebulization every 4 (four) hours as needed (for wheezing).  360 mL  5  . Lactulose 20 GM/30ML SOLN Take 15-30 mLs by mouth daily.      [name] loratadine (CLARITIN) 10 MG tablet Take 1 tablet (10 mg total) by mouth daily.  31 tablet  12  . metoprolol tartrate (LOPRESSOR) 25 MG tablet Take 1 tablet (25 mg total) by mouth 2 (two) times daily.  360 tablet  6  . montelukast (SINGULAIR) 10 MG tablet Take 1 tablet (10 mg  total) by mouth at bedtime.  30 tablet  5  . Multiple Vitamins-Minerals (MULTIVITAMIN WITH MINERALS) tablet Take 1 tablet by mouth daily.      [name] nystatin (MYCOSTATIN) 100000 UNIT/ML suspension Take 5 mLs (500,000 Units total) by mouth 4 (four) times daily.  60 mL  0  . Oxycodone HCl 10 MG TABS Take 10 mg by mouth 3 (three) times daily.      . OXYGEN-HELIUM IN by Other route. Uses 2 Liters of Oxygen Continuously      . pantoprazole (PROTONIX) 40 MG tablet Take 1 tablet (40 mg total) by mouth daily.  30 tablet  5  . potassium chloride SA (K-DUR,KLOR-CON) 20 MEQ tablet Take 20 mEq by mouth 2 (two) times daily.      . predniSONE (DELTASONE) 2.5 MG tablet 1 pill daily for 2  weeks, then 1 pill every other day for 2 weeks  21 tablet  0  . pregabalin (LYRICA) 75 MG capsule Take 75 mg by mouth 2 (two) times daily.      . rizatriptan (MAXALT) 10 MG tablet Take 10 mg by mouth daily as needed. May repeat in 2 hours if needed      . topiramate (TOPAMAX) 25 MG tablet Take 75 mg by mouth 2 (two) times daily.      [name] zolpidem (AMBIEN) 5 MG tablet Take 5 mg by mouth at bedtime as needed for sleep.        No current facility-administered medications on file prior to visit.    PAST MEDICAL HISTORY: Past Medical History  Diagnosis Date  . Hypertension   . Migraine   . Anxiety   . Abnormal Pap smear   . Anemia   . Fibroids   . Fibromyalgia     degenerative disc disease  . Depression   . Sleep apnea   . Shortness of breath   . On home oxygen therapy     2 liters [place]  . PONV (postoperative nausea and vomiting)   . Low iron     pt states low iron-for Feraheme today in [facility]  . Morbid obesity with BMI of 45.0-49.9, adult   . Iron deficiency anemia 04/24/2012  . Microcytic anemia 11/04/2013  . Unspecified deficiency anemia 11/04/2013  . Alpha thalassemia   . Asthma     recent admission with exacerbation of asthma 09/15/11    PAST SURGICAL HISTORY: Past Surgical History  Procedure Laterality Date  . Wisdom tooth extraction    . Mandible surgery  2008  . Carpal tunnel release    . Cesarean section      1994, 1996   . Cesarean section  11/28/2011    Procedure: CESAREAN SECTION;  Surgeon: Woodroe Mode, MD;  Location: [facility];  Service: Obstetrics;  Laterality: N/A;    FAMILY HISTORY: Family History  Problem Relation Age of Onset  . Sickle cell trait Maternal Aunt   . Sickle cell anemia Other     SOCIAL HISTORY:  History   Social History  . Marital Status: Single    Spouse Name: N/A    Number of Children: N/A  . Years of Education: N/A   Occupational History  . Not on file.   Social History Main Topics  . Smoking status: Never Smoker   . Smokeless  tobacco: Never Used     Comment: Pt was exposed to 2nd hand smoke at work years ago.  . Alcohol Use: No  . Drug Use: No  . Sexual  Activity: Yes    Birth Control/ Protection: None     Comment: 2 months ago   Other Topics Concern  . Not on file   Social History Narrative  . No narrative on file     PHYSICAL EXAM   Filed Vitals:   01/07/14 1439  BP: 130/82  Pulse: 76  Height: 5\' 2"  (1.575 m)  Weight: 301 lb (136.533 kg)    Not recorded    Body mass index is 55.04 kg/(m^2).   Generalized: In no acute distress  Neck: Supple, no carotid bruits   Cardiac: Regular rate rhythm  Pulmonary: Clear to auscultation bilaterally  Musculoskeletal: No deformity  Neurological examination  Mentation: Alert oriented to time, place, history taking, and causual conversation, obese, nasal oxygen  Cranial nerve II-XII: Pupils were equal round reactive to light. Extraocular movements were full.  Visual field were full on confrontational test. I was not able to clearly visualize bilateral fundi   Facial sensation and strength were normal. Hearing was intact to finger rubbing bilaterally. Uvula tongue midline.  Head turning and shoulder shrug and were normal and symmetric.Tongue protrusion into cheek strength was normal.  Motor: Normal tone, bulk and strength.  Sensory: Intact to fine touch, pinprick, preserved vibratory sensation, and proprioception at toes.  Coordination: Normal finger to nose, heel-to-shin bilaterally there was no truncal ataxia  Gait: Rising up from seated position without assistance, normal stance, without trunk ataxia, moderate stride, good arm swing, smooth turning, able to perform tiptoe, and heel walking without difficulty.   Romberg signs: Negative  Deep tendon reflexes: Brachioradialis 2/2, biceps 2/2, triceps 2/2, patellar 2/2, Achilles 2/2, plantar responses were flexor bilaterally.   DIAGNOSTIC DATA (LABS, IMAGING, TESTING) - I reviewed patient records,  labs, notes, testing and imaging myself where available.  Lab Results  Component Value Date   WBC 9.4 01/06/2014   HGB 11.8* 01/06/2014   HCT 38.2 01/06/2014   MCV 76.4* 01/06/2014   PLT 371 01/06/2014      Component Value Date/Time   NA 139 01/06/2014 0800   NA 139 08/29/2012 1102   K 4.0 01/06/2014 0800   K 3.2* 08/29/2012 1102   CL 99 01/06/2014 0800   CL 105 08/29/2012 1102   CO2 26 01/06/2014 0800   CO2 23 08/29/2012 1102   GLUCOSE 129* 01/06/2014 0800   GLUCOSE 130* 08/29/2012 1102   BUN 14 01/06/2014 0800   BUN 5.8* 08/29/2012 1102   CREATININE 1.30* 01/06/2014 0800   CREATININE 0.8 08/29/2012 1102   CREATININE 0.49* 07/12/2011 1346   CREATININE 0.49* 07/12/2011 1346   CALCIUM 9.2 01/06/2014 0800   CALCIUM 8.6 08/29/2012 1102   PROT 7.7 01/06/2014 0800   PROT 7.3 08/29/2012 1102   ALBUMIN 3.8 01/06/2014 0800   ALBUMIN 3.6 08/29/2012 1102   AST 39* 01/06/2014 0800   AST 22 08/29/2012 1102   ALT 30 01/06/2014 0800   ALT 23 08/29/2012 1102   ALKPHOS 90 01/06/2014 0800   ALKPHOS 86 08/29/2012 1102   BILITOT <0.2* 01/06/2014 0800   BILITOT 0.22 08/29/2012 1102   GFRNONAA 51* 01/06/2014 0800   GFRAA 59* 01/06/2014 0800   No results found for this basename: CHOL, HDL, LDLCALC, LDLDIRECT, TRIG, CHOLHDL   No results found for this basename: HGBA1C   Lab Results  Component Value Date   VITAMINB12 351 12/04/2013   Lab Results  Component Value Date   TSH 1.596 09/25/2011      ASSESSMENT AND PLAN  Cheyenne Arias  is a 38 y.o. female with past medical history of morbid obesity, chronic migraines, continues to have frequent headaches, recent rapid weight gain, also complains of poor vision, I was not able to clearly visualize her fundi,  She is certainly at risk for developing pseudotumor cerebri, with her morbid obesity, I will refer her to ophthalmologist for further evaluation, including visual field testing,  Increase Topamax to 100 mg twice a day, Maxalt, Cambia as needed for abortive  treatment, Return to clinic with nurse practitioner in 2-3 months   Marcial Pacas, M.D. Ph.D.  [facility] [address] [employer] [place], [place] [address] [phone]

## 2014-01-10 ENCOUNTER — Other Ambulatory Visit: Payer: Self-pay | Admitting: Pulmonary Disease

## 2014-01-12 ENCOUNTER — Other Ambulatory Visit: Payer: Self-pay | Admitting: Pulmonary Disease

## 2014-01-15 ENCOUNTER — Telehealth: Payer: Self-pay | Admitting: *Deleted

## 2014-01-15 NOTE — Telephone Encounter (Signed)
Pt left VM asking if she can get her "Thalassemia results?"

## 2014-01-18 NOTE — Telephone Encounter (Signed)
It was normal

## 2014-01-20 ENCOUNTER — Telehealth: Payer: Self-pay | Admitting: *Deleted

## 2014-01-21 NOTE — Telephone Encounter (Signed)
Pt called last week to ask about Thalassemia results.  Dr. Alvy Bimler says they are negative. Attempted to call pt back x 2 and her VM is full.

## 2014-02-15 ENCOUNTER — Encounter: Payer: Self-pay | Admitting: Neurology

## 2014-02-17 ENCOUNTER — Ambulatory Visit: Payer: Medicaid Other | Admitting: Pulmonary Disease

## 2014-02-24 ENCOUNTER — Other Ambulatory Visit: Payer: Self-pay | Admitting: Hematology and Oncology

## 2014-03-05 ENCOUNTER — Ambulatory Visit: Payer: Medicaid Other | Admitting: Pulmonary Disease

## 2014-03-09 ENCOUNTER — Other Ambulatory Visit: Payer: Medicaid Other

## 2014-03-15 ENCOUNTER — Encounter: Payer: Self-pay | Admitting: Pulmonary Disease

## 2014-03-16 ENCOUNTER — Ambulatory Visit: Payer: Medicaid Other | Admitting: Hematology and Oncology

## 2014-03-24 ENCOUNTER — Other Ambulatory Visit: Payer: Self-pay | Admitting: Pulmonary Disease

## 2014-03-28 ENCOUNTER — Encounter (HOSPITAL_BASED_OUTPATIENT_CLINIC_OR_DEPARTMENT_OTHER): Payer: Self-pay | Admitting: Emergency Medicine

## 2014-03-28 ENCOUNTER — Emergency Department (HOSPITAL_BASED_OUTPATIENT_CLINIC_OR_DEPARTMENT_OTHER)
Admission: EM | Admit: 2014-03-28 | Discharge: 2014-03-29 | Disposition: A | Discharge status: Home / Self Care | Payer: Medicare Other | Attending: Emergency Medicine | Admitting: Emergency Medicine

## 2014-03-28 DIAGNOSIS — I10 Essential (primary) hypertension: Secondary | ICD-10-CM | POA: Diagnosis not present

## 2014-03-28 DIAGNOSIS — D560 Alpha thalassemia: Secondary | ICD-10-CM | POA: Insufficient documentation

## 2014-03-28 DIAGNOSIS — Z79899 Other long term (current) drug therapy: Secondary | ICD-10-CM | POA: Insufficient documentation

## 2014-03-28 DIAGNOSIS — Z791 Long term (current) use of non-steroidal anti-inflammatories (NSAID): Secondary | ICD-10-CM | POA: Diagnosis not present

## 2014-03-28 DIAGNOSIS — J45909 Unspecified asthma, uncomplicated: Secondary | ICD-10-CM | POA: Diagnosis not present

## 2014-03-28 DIAGNOSIS — Z91040 Latex allergy status: Secondary | ICD-10-CM | POA: Insufficient documentation

## 2014-03-28 DIAGNOSIS — N390 Urinary tract infection, site not specified: Secondary | ICD-10-CM | POA: Diagnosis not present

## 2014-03-28 DIAGNOSIS — Z7951 Long term (current) use of inhaled steroids: Secondary | ICD-10-CM | POA: Insufficient documentation

## 2014-03-28 DIAGNOSIS — Z3202 Encounter for pregnancy test, result negative: Secondary | ICD-10-CM | POA: Diagnosis not present

## 2014-03-28 DIAGNOSIS — Z8742 Personal history of other diseases of the female genital tract: Secondary | ICD-10-CM | POA: Diagnosis not present

## 2014-03-28 DIAGNOSIS — F419 Anxiety disorder, unspecified: Secondary | ICD-10-CM | POA: Insufficient documentation

## 2014-03-28 DIAGNOSIS — G4730 Sleep apnea, unspecified: Secondary | ICD-10-CM | POA: Diagnosis not present

## 2014-03-28 DIAGNOSIS — Z9981 Dependence on supplemental oxygen: Secondary | ICD-10-CM | POA: Insufficient documentation

## 2014-03-28 DIAGNOSIS — G43909 Migraine, unspecified, not intractable, without status migrainosus: Secondary | ICD-10-CM | POA: Diagnosis not present

## 2014-03-28 DIAGNOSIS — A5901 Trichomonal vulvovaginitis: Secondary | ICD-10-CM

## 2014-03-28 DIAGNOSIS — R300 Dysuria: Secondary | ICD-10-CM | POA: Diagnosis present

## 2014-03-28 DIAGNOSIS — D509 Iron deficiency anemia, unspecified: Secondary | ICD-10-CM | POA: Insufficient documentation

## 2014-03-28 DIAGNOSIS — F329 Major depressive disorder, single episode, unspecified: Secondary | ICD-10-CM | POA: Diagnosis not present

## 2014-03-28 LAB — URINALYSIS, ROUTINE W REFLEX MICROSCOPIC
Bilirubin Urine: NEGATIVE
Glucose, UA: NEGATIVE mg/dL
Ketones, ur: NEGATIVE mg/dL
Nitrite: NEGATIVE
PROTEIN: NEGATIVE mg/dL
SPECIFIC GRAVITY, URINE: 1.016 (ref 1.005–1.030)
Urobilinogen, UA: 0.2 mg/dL (ref 0.0–1.0)
pH: 6.5 (ref 5.0–8.0)

## 2014-03-28 LAB — URINE MICROSCOPIC-ADD ON

## 2014-03-28 LAB — WET PREP, GENITAL: Yeast Wet Prep HPF POC: NONE SEEN

## 2014-03-28 LAB — PREGNANCY, URINE: PREG TEST UR: NEGATIVE

## 2014-03-28 MED ORDER — PHENAZOPYRIDINE HCL 95 MG PO TABS: 95.0000 mg | ORAL_TABLET | Freq: Three times a day (TID) | ORAL | Status: DC | PRN

## 2014-03-28 MED ORDER — CEPHALEXIN 500 MG PO CAPS: 500.0000 mg | ORAL_CAPSULE | Freq: Four times a day (QID) | ORAL | Status: DC

## 2014-03-28 MED ORDER — METRONIDAZOLE 500 MG PO TABS: 500.0000 mg | ORAL_TABLET | Freq: Two times a day (BID) | ORAL | Status: DC

## 2014-03-28 MED ORDER — CEFTRIAXONE SODIUM 250 MG IJ SOLR
250.0000 mg | Freq: Once | INTRAMUSCULAR | Status: AC
  Administered 2014-03-28: 250 mg via INTRAMUSCULAR
  Filled 2014-03-28: qty 250

## 2014-03-28 MED ORDER — KETOROLAC TROMETHAMINE 60 MG/2ML IM SOLN
60.0000 mg | Freq: Once | INTRAMUSCULAR | Status: AC
  Administered 2014-03-28: 60 mg via INTRAMUSCULAR
  Filled 2014-03-28: qty 2

## 2014-03-28 MED ORDER — AZITHROMYCIN 250 MG PO TABS
1000.0000 mg | ORAL_TABLET | Freq: Once | ORAL | Status: AC
Start: 2014-03-28 — End: 2014-03-28
  Administered 2014-03-28: 1000 mg via ORAL
  Filled 2014-03-28: qty 4

## 2014-03-28 NOTE — ED Notes (Signed)
Pt reports pain with urinaition x4 days also noticed blood with wiping

## 2014-03-28 NOTE — Discharge Instructions (Signed)
Take Flagyl twice daily for the next week, along with Keflex as directed for the next week. Take pyridium as directed for your burning sensation with urination. Trichomonas is a sexually transmitted disease. He will need to inform your partner of this so he can receive treatment. You were also prophylactically treated today for gonorrhea and Chlamydia. If these tests results positive, you will be contacted and are then obligated to inform your partner.  Trichomoniasis Trichomoniasis is an infection caused by an organism called Trichomonas. The infection can affect both women and men. In women, the outer female genitalia and the vagina are affected. In men, the penis is mainly affected, but the prostate and other reproductive organs can also be involved. Trichomoniasis is a sexually transmitted infection (STI) and is most often passed to another person through sexual contact.  RISK FACTORS  Having unprotected sexual intercourse.  Having sexual intercourse with an infected partner. SIGNS AND SYMPTOMS  Symptoms of trichomoniasis in women include:  Abnormal gray-green frothy vaginal discharge.  Itching and irritation of the vagina.  Itching and irritation of the area outside the vagina. Symptoms of trichomoniasis in men include:   Penile discharge with or without pain.  Pain during urination. This results from inflammation of the urethra. DIAGNOSIS  Trichomoniasis may be found during a Pap test or physical exam. Your health care provider may use one of the following methods to help diagnose this infection:  Examining vaginal discharge under a microscope. For men, urethral discharge would be examined.  Testing the pH of the vagina with a test tape.  Using a vaginal swab test that checks for the Trichomonas organism. A test is available that provides results within a few minutes.  Doing a culture test for the organism. This is not usually needed. TREATMENT   You may be given medicine to  fight the infection. Women should inform their health care provider if they could be or are pregnant. Some medicines used to treat the infection should not be taken during pregnancy.  Your health care provider may recommend over-the-counter medicines or creams to decrease itching or irritation.  Your sexual partner will need to be treated if infected. HOME CARE INSTRUCTIONS   Take medicines only as directed by your health care provider.  Take over-the-counter medicine for itching or irritation as directed by your health care provider.  Do not have sexual intercourse while you have the infection.  Women should not douche or wear tampons while they have the infection.  Discuss your infection with your partner. Your partner may have gotten the infection from you, or you may have gotten it from your partner.  Have your sex partner get examined and treated if necessary.  Practice safe, informed, and protected sex.  See your health care provider for other STI testing. SEEK MEDICAL CARE IF:   You still have symptoms after you finish your medicine.  You develop abdominal pain.  You have pain when you urinate.  You have bleeding after sexual intercourse.  You develop a rash.  Your medicine makes you sick or makes you throw up (vomit). MAKE SURE YOU:  Understand these instructions.  Will watch your condition.  Will get help right away if you are not doing well or get worse. Document Released: 09/26/2000 Document Revised: 08/17/2013 Document Reviewed: 01/12/2013 [facility] Patient Information 2015 [place], [place]. This information is not intended to replace advice given to you by your health care provider. Make sure you discuss any questions you have with your health care  provider.  Urinary Tract Infection Urinary tract infections (UTIs) can develop anywhere along your urinary tract. Your urinary tract is your body's drainage system for removing wastes and extra water. Your urinary  tract includes two kidneys, two ureters, a bladder, and a urethra. Your kidneys are a pair of bean-shaped organs. Each kidney is about the size of your fist. They are located below your ribs, one on each side of your spine. CAUSES Infections are caused by microbes, which are microscopic organisms, including fungi, viruses, and bacteria. These organisms are so small that they can only be seen through a microscope. Bacteria are the microbes that most commonly cause UTIs. SYMPTOMS  Symptoms of UTIs may vary by age and gender of the patient and by the location of the infection. Symptoms in young women typically include a frequent and intense urge to urinate and a painful, burning feeling in the bladder or urethra during urination. Older women and men are more likely to be tired, shaky, and weak and have muscle aches and abdominal pain. A fever may mean the infection is in your kidneys. Other symptoms of a kidney infection include pain in your back or sides below the ribs, nausea, and vomiting. DIAGNOSIS To diagnose a UTI, your caregiver will ask you about your symptoms. Your caregiver also will ask to provide a urine sample. The urine sample will be tested for bacteria and white blood cells. White blood cells are made by your body to help fight infection. TREATMENT  Typically, UTIs can be treated with medication. Because most UTIs are caused by a bacterial infection, they usually can be treated with the use of antibiotics. The choice of antibiotic and length of treatment depend on your symptoms and the type of bacteria causing your infection. HOME CARE INSTRUCTIONS  If you were prescribed antibiotics, take them exactly as your caregiver instructs you. Finish the medication even if you feel better after you have only taken some of the medication.  Drink enough water and fluids to keep your urine clear or pale yellow.  Avoid caffeine, tea, and carbonated beverages. They tend to irritate your  bladder.  Empty your bladder often. Avoid holding urine for long periods of time.  Empty your bladder before and after sexual intercourse.  After a bowel movement, women should cleanse from front to back. Use each tissue only once. SEEK MEDICAL CARE IF:   You have back pain.  You develop a fever.  Your symptoms do not begin to resolve within 3 days. SEEK IMMEDIATE MEDICAL CARE IF:   You have severe back pain or lower abdominal pain.  You develop chills.  You have nausea or vomiting.  You have continued burning or discomfort with urination. MAKE SURE YOU:   Understand these instructions.  Will watch your condition.  Will get help right away if you are not doing well or get worse. Document Released: 01/10/2005 Document Revised: 10/02/2011 Document Reviewed: 05/11/2011 [facility] Patient Information 2015 [place], [place]. This information is not intended to replace advice given to you by your health care provider. Make sure you discuss any questions you have with your health care provider.  Sexually Transmitted Disease A sexually transmitted disease (STD) is a disease or infection that may be passed (transmitted) from person to person, usually during sexual activity. This may happen by way of saliva, semen, blood, vaginal mucus, or urine. Common STDs include:   Gonorrhea.   Chlamydia.   Syphilis.   HIV and AIDS.   Genital herpes.   Hepatitis  B and C.   Trichomonas.   Human papillomavirus (HPV).   Pubic lice.   Scabies.  Mites.  Bacterial vaginosis. WHAT ARE CAUSES OF STDs? An STD may be caused by bacteria, a virus, or parasites. STDs are often transmitted during sexual activity if one person is infected. However, they may also be transmitted through nonsexual means. STDs may be transmitted after:   Sexual intercourse with an infected person.   Sharing sex toys with an infected person.   Sharing needles with an infected person or using unclean  piercing or tattoo needles.  Having intimate contact with the genitals, mouth, or rectal areas of an infected person.   Exposure to infected fluids during birth. WHAT ARE THE SIGNS AND SYMPTOMS OF STDs? Different STDs have different symptoms. Some people may not have any symptoms. If symptoms are present, they may include:   Painful or bloody urination.   Pain in the pelvis, abdomen, vagina, anus, throat, or eyes.   A skin rash, itching, or irritation.  Growths, ulcerations, blisters, or sores in the genital and anal areas.  Abnormal vaginal discharge with or without bad odor.   Penile discharge in men.   Fever.   Pain or bleeding during sexual intercourse.   Swollen glands in the groin area.   Yellow skin and eyes (jaundice). This is seen with hepatitis.   Swollen testicles.  Infertility.  Sores and blisters in the mouth. HOW ARE STDs DIAGNOSED? To make a diagnosis, your health care provider may:   Take a medical history.   Perform a physical exam.   Take a sample of any discharge to examine.  Swab the throat, cervix, opening to the penis, rectum, or vagina for testing.  Test a sample of your first morning urine.   Perform blood tests.   Perform a Pap test, if this applies.   Perform a colposcopy.   Perform a laparoscopy.  HOW ARE STDs TREATED? Treatment depends on the STD. Some STDs may be treated but not cured.   Chlamydia, gonorrhea, trichomonas, and syphilis can be cured with antibiotic medicine.   Genital herpes, hepatitis, and HIV can be treated, but not cured, with prescribed medicines. The medicines lessen symptoms.   Genital warts from HPV can be treated with medicine or by freezing, burning (electrocautery), or surgery. Warts may come back.   HPV cannot be cured with medicine or surgery. However, abnormal areas may be removed from the cervix, vagina, or vulva.   If your diagnosis is confirmed, your recent sexual partners  need treatment. This is true even if they are symptom-free or have a negative culture or evaluation. They should not have sex until their health care providers say it is okay. HOW CAN I REDUCE MY RISK OF GETTING AN STD? Take these steps to reduce your risk of getting an STD:  Use latex condoms, dental dams, and water-soluble lubricants during sexual activity. Do not use petroleum jelly or oils.  Avoid having multiple sex partners.  Do not have sex with someone who has other sex partners.  Do not have sex with anyone you do not know or who is at high risk for an STD.  Avoid risky sex practices that can break your skin.  Do not have sex if you have open sores on your mouth or skin.  Avoid drinking too much alcohol or taking illegal drugs. Alcohol and drugs can affect your judgment and put you in a vulnerable position.  Avoid engaging in oral and anal sex  acts.  Get vaccinated for HPV and hepatitis. If you have not received these vaccines in the past, talk to your health care provider about whether one or both might be right for you.   If you are at risk of being infected with HIV, it is recommended that you take a prescription medicine daily to prevent HIV infection. This is called pre-exposure prophylaxis (PrEP). You are considered at risk if:  You are a man who has sex with other men (MSM).  You are a heterosexual man or woman and are sexually active with more than one partner.  You take drugs by injection.  You are sexually active with a partner who has HIV.  Talk with your health care provider about whether you are at high risk of being infected with HIV. If you choose to begin PrEP, you should first be tested for HIV. You should then be tested every 3 months for as long as you are taking PrEP.  WHAT SHOULD I DO IF I THINK I HAVE AN STD?  See your health care provider.   Tell your sexual partner(s). They should be tested and treated for any STDs.  Do not have sex until  your health care provider says it is okay. WHEN SHOULD I GET IMMEDIATE MEDICAL CARE? Contact your health care provider right away if:   You have severe abdominal pain.  You are a man and notice swelling or pain in your testicles.  You are a woman and notice swelling or pain in your vagina. Document Released: 06/23/2002 Document Revised: 04/07/2013 Document Reviewed: 10/21/2012 [facility] Patient Information 2015 [place], [place]. This information is not intended to replace advice given to you by your health care provider. Make sure you discuss any questions you have with your health care provider.

## 2014-03-28 NOTE — ED Provider Notes (Signed)
CSN: [id number]     Arrival date & time 03/28/14  2154 History   First MD Initiated Contact with Patient 03/28/14 2218     Chief Complaint  Patient presents with  . Dysuria     (Consider location/radiation/quality/duration/timing/severity/associated sxs/prior Treatment) HPI Comments: 38 year old morbidly obese female presenting with 4 days of dysuria and hematuria. Patient reports 4 days ago she noticed blood in her urine, she took cranberry pills and went away. This evening, she again noticed blood in her urine, states there was a clot of blood. Denies vaginal bleeding. States she's had hematuria in the past with urinary tract infection. Admits to lower abdominal pressure and nausea. Denies fevers or vomiting. She also reports irritation to her vagina without discharge. She states she believes this is from her soap. She is sexually active and had unprotected intercourse with a longtime friend that she hasn't seen in a while.  The history is provided by the patient.    Past Medical History  Diagnosis Date  . Hypertension   . Migraine   . Anxiety   . Abnormal Pap smear   . Anemia   . Fibroids   . Fibromyalgia     degenerative disc disease  . Depression   . Sleep apnea   . Shortness of breath   . On home oxygen therapy     2 liters [place]  . PONV (postoperative nausea and vomiting)   . Low iron     pt states low iron-for Feraheme today in [facility]  . Morbid obesity with BMI of 45.0-49.9, adult   . Iron deficiency anemia 04/24/2012  . Microcytic anemia 11/04/2013  . Unspecified deficiency anemia 11/04/2013  . Alpha thalassemia   . Asthma     recent admission with exacerbation of asthma 09/15/11   Past Surgical History  Procedure Laterality Date  . Wisdom tooth extraction    . Mandible surgery  2008  . Carpal tunnel release    . Cesarean section      1994, 1996   . Cesarean section  11/28/2011    Procedure: CESAREAN SECTION;  Surgeon: Woodroe Mode, MD;  Location: [facility];  Service:  Obstetrics;  Laterality: N/A;   Family History  Problem Relation Age of Onset  . Sickle cell trait Maternal Aunt   . Sickle cell anemia Other    History  Substance Use Topics  . Smoking status: Never Smoker   . Smokeless tobacco: Never Used     Comment: Pt was exposed to 2nd hand smoke at work years ago.  . Alcohol Use: No   OB History    Gravida Para Term Preterm AB TAB SAB Ectopic Multiple Living   3 3 3       3      Review of Systems   10 Systems reviewed and are negative for acute change except as noted in the HPI.   Allergies  Latex and Morphine and related  Home Medications   Prior to Admission medications   Medication Sig Start Date End Date Taking? Authorizing Provider  albuterol (PROVENTIL HFA;VENTOLIN HFA) 108 (90 BASE) MCG/ACT inhaler Inhale 2 puffs into the lungs every 6 (six) hours as needed for wheezing or shortness of breath. 07/29/13   Tammy S Parrett, NP  amLODipine (NORVASC) 10 MG tablet Take 10 mg by mouth daily.    Historical Provider, MD  budesonide (PULMICORT) 0.5 MG/2ML nebulizer solution 1 vial twice a day Dx 493.90 07/29/13   Melvenia Needles, NP  cephALEXin (  KEFLEX) 500 MG capsule Take 1 capsule (500 mg total) by mouth 4 (four) times daily. 03/28/14   Aariyah Sampey M Jahayra Mazo, PA-C  clonazePAM (KLONOPIN) 0.5 MG tablet Take 0.5 mg by mouth 2 (two) times daily as needed for anxiety.     Historical Provider, MD  cyclobenzaprine (FLEXERIL) 10 MG tablet Take 10 mg by mouth 3 (three) times daily as needed. For muscle spasms    Historical Provider, MD  Diclofenac Potassium 50 MG PACK Take 50 mg by mouth as needed. 01/07/14   Marcial Pacas, MD  ferumoxytol [facility]) 510 MG/17ML SOLN injection Inject 510 mg into the vein every 3 (three) months.    Historical Provider, MD  FLUoxetine (PROZAC) 20 MG tablet Take 20 mg by mouth daily.    Historical Provider, MD  fluticasone (FLONASE) 50 MCG/ACT nasal spray Place 2 sprays into both nostrils 2 (two) times daily. 07/29/13 05/30/16  Tammy  S Parrett, NP  furosemide (LASIX) 40 MG tablet Take 1 tablet (40 mg total) by mouth daily. 10/25/11 07/30/14  Donnamae Jude, MD  ibuprofen (ADVIL,MOTRIN) 800 MG tablet Take 800 mg by mouth 3 (three) times daily. 02/21/13   Kaitlyn Szekalski, PA-C  ipratropium-albuterol (DUONEB) 0.5-2.5 (3) MG/3ML SOLN Take 3 mLs by nebulization every 4 (four) hours as needed (for wheezing). 07/29/13   Tammy S Parrett, NP  Lactulose 20 GM/30ML SOLN Take 15-30 mLs by mouth daily.    Historical Provider, MD  loratadine (CLARITIN) 10 MG tablet Take 1 tablet (10 mg total) by mouth daily. 08/26/13 06/27/16  Chesley Mires, MD  metoprolol tartrate (LOPRESSOR) 25 MG tablet Take 1 tablet (25 mg total) by mouth 2 (two) times daily. 09/28/11 07/30/14  Emily Filbert, MD  metroNIDAZOLE (FLAGYL) 500 MG tablet Take 1 tablet (500 mg total) by mouth 2 (two) times daily. One po bid x 7 days 03/28/14   Hessie Diener Gertude Benito, PA-C  montelukast (SINGULAIR) 10 MG tablet TAKE 1 TABLET (10 MG TOTAL) BY MOUTH AT BEDTIME. 01/15/14   Chesley Mires, MD  montelukast (SINGULAIR) 10 MG tablet TAKE 1 TABLET (10 MG TOTAL) BY MOUTH AT BEDTIME. 03/25/14   Chesley Mires, MD  Multiple Vitamins-Minerals (MULTIVITAMIN WITH MINERALS) tablet Take 1 tablet by mouth daily.    Historical Provider, MD  nystatin (MYCOSTATIN) 100000 UNIT/ML suspension Take 5 mLs (500,000 Units total) by mouth 4 (four) times daily. 11/05/13   Chesley Mires, MD  Oxycodone HCl 10 MG TABS Take 10 mg by mouth 3 (three) times daily.    Historical Provider, MD  OXYGEN-HELIUM IN by Other route. Uses 2 Liters of Oxygen Continuously    Historical Provider, MD  pantoprazole (PROTONIX) 40 MG tablet Take 1 tablet (40 mg total) by mouth daily. 07/29/13   Tammy S Parrett, NP  phenazopyridine (PYRIDIUM) 95 MG tablet Take 1 tablet (95 mg total) by mouth 3 (three) times daily as needed for pain. 03/28/14   Deeksha Cotrell M Brody Bonneau, PA-C  potassium chloride SA (K-DUR,KLOR-CON) 20 MEQ tablet Take 20 mEq by mouth 2 (two) times daily. 10/22/11  07/30/14  Donnamae Jude, MD  predniSONE (DELTASONE) 2.5 MG tablet 1 pill daily for 2 weeks, then 1 pill every other day for 2 weeks 11/05/13   Chesley Mires, MD  pregabalin (LYRICA) 75 MG capsule Take 75 mg by mouth 2 (two) times daily.    Historical Provider, MD  rizatriptan (MAXALT) 10 MG tablet Take 1 tablet (10 mg total) by mouth daily as needed. May repeat in 2 hours if needed 01/07/14  Marcial Pacas, MD  topiramate (TOPAMAX) 100 MG tablet bid 01/07/14   Marcial Pacas, MD  zolpidem (AMBIEN) 5 MG tablet Take 5 mg by mouth at bedtime as needed for sleep.     Historical Provider, MD   BP 110/33 mmHg  Pulse 94  Temp(Src) 98.4 F (36.9 C)  SpO2 100% Physical Exam  Constitutional: She is oriented to person, place, and time. She appears well-developed and well-nourished. No distress.  Morbidly obese.  HENT:  Head: Normocephalic and atraumatic.  Mouth/Throat: Oropharynx is clear and moist.  Eyes: Conjunctivae are normal.  Neck: Normal range of motion. Neck supple.  Cardiovascular: Normal rate, regular rhythm and normal heart sounds.   Pulmonary/Chest: Effort normal and breath sounds normal.  Abdominal: Soft. Bowel sounds are normal. There is tenderness. There is no rigidity, no rebound, no guarding and no CVA tenderness.  Mild suprapubic tenderness. No peritoneal signs.  Genitourinary: Cervix exhibits no motion tenderness, no discharge and no friability. No erythema, tenderness or bleeding in the vagina. Vaginal discharge (white/yellow) found.  Exam limited by patient's body habitus.  Musculoskeletal: Normal range of motion. She exhibits no edema.  Neurological: She is alert and oriented to person, place, and time.  Skin: Skin is warm and dry. She is not diaphoretic.  Psychiatric: She has a normal mood and affect. Her behavior is normal.  Nursing note and vitals reviewed.   ED Course  Procedures (including critical care time) Labs Review Labs Reviewed  WET PREP, GENITAL - Abnormal; Notable for  the following:    Trich, Wet Prep MODERATE (*)    Clue Cells Wet Prep HPF POC FEW (*)    WBC, Wet Prep HPF POC FEW (*)    All other components within normal limits  URINALYSIS, ROUTINE W REFLEX MICROSCOPIC - Abnormal; Notable for the following:    APPearance CLOUDY (*)    Hgb urine dipstick SMALL (*)    Leukocytes, UA LARGE (*)    All other components within normal limits  URINE MICROSCOPIC-ADD ON - Abnormal; Notable for the following:    Squamous Epithelial / LPF MANY (*)    Bacteria, UA MANY (*)    All other components within normal limits  GC/CHLAMYDIA PROBE AMP  PREGNANCY, URINE    Imaging Review No results found.   EKG Interpretation None      MDM   Final diagnoses:  Trichomonas vaginitis  UTI (lower urinary tract infection)   Patient in no apparent distress. Vital signs stable. Abdomen is soft with mild suprapubic tenderness. No peritoneal signs. Pelvic exam limited by patient's body habitus, however no cervical motion tenderness. Treat Trichomonas with Flagyl. UTI with Keflex and pyridium. Prophylactically treated for GC/Chlamydia. States sexual practices discussed. Follow-up with PCP. Stable for discharge. Return precautions given. Patient states understanding of treatment care plan and is agreeable.  Carman Ching, PA-C 03/28/14 2338  Quintella Reichert, MD 03/28/14 [phone]

## 2014-03-29 NOTE — ED Notes (Signed)
Pt ambulating independently w/ steady gait on d/c in no acute distress, A&Ox4. D/c instructions reviewed w/ pt and family - pt and family deny any further questions or concerns at present. Rx given x3  

## 2014-03-30 LAB — GC/CHLAMYDIA PROBE AMP
CT Probe RNA: NEGATIVE
GC PROBE AMP APTIMA: NEGATIVE

## 2014-03-31 ENCOUNTER — Telehealth: Payer: Self-pay | Admitting: Hematology and Oncology

## 2014-03-31 NOTE — Telephone Encounter (Signed)
pt called to r/s missed appt...done....pt aware of new d.t °

## 2014-04-01 ENCOUNTER — Other Ambulatory Visit (HOSPITAL_BASED_OUTPATIENT_CLINIC_OR_DEPARTMENT_OTHER): Payer: Medicare Other

## 2014-04-01 ENCOUNTER — Ambulatory Visit: Payer: Medicaid Other | Admitting: Pulmonary Disease

## 2014-04-01 DIAGNOSIS — D509 Iron deficiency anemia, unspecified: Secondary | ICD-10-CM

## 2014-04-01 LAB — IRON AND TIBC CHCC
%SAT: 10 % — AB (ref 21–57)
IRON: 30 ug/dL — AB (ref 41–142)
TIBC: 309 ug/dL (ref 236–444)
UIBC: 279 ug/dL (ref 120–384)

## 2014-04-01 LAB — CBC & DIFF AND RETIC
BASO%: 0.2 % (ref 0.0–2.0)
BASOS ABS: 0 10*3/uL (ref 0.0–0.1)
EOS ABS: 0.3 10*3/uL (ref 0.0–0.5)
EOS%: 2.4 % (ref 0.0–7.0)
HCT: 36.9 % (ref 34.8–46.6)
HEMOGLOBIN: 11.4 g/dL — AB (ref 11.6–15.9)
IMMATURE RETIC FRACT: 9.4 % (ref 1.60–10.00)
LYMPH#: 1.8 10*3/uL (ref 0.9–3.3)
LYMPH%: 14.6 % (ref 14.0–49.7)
MCH: 24.4 pg — ABNORMAL LOW (ref 25.1–34.0)
MCHC: 30.9 g/dL — ABNORMAL LOW (ref 31.5–36.0)
MCV: 78.8 fL — ABNORMAL LOW (ref 79.5–101.0)
MONO#: 0.4 10*3/uL (ref 0.1–0.9)
MONO%: 3.5 % (ref 0.0–14.0)
NEUT%: 79.3 % — ABNORMAL HIGH (ref 38.4–76.8)
NEUTROS ABS: 9.6 10*3/uL — AB (ref 1.5–6.5)
Platelets: 382 10*3/uL (ref 145–400)
RBC: 4.68 10*6/uL (ref 3.70–5.45)
RDW: 15.9 % — ABNORMAL HIGH (ref 11.2–14.5)
Retic %: 1.07 % (ref 0.70–2.10)
Retic Ct Abs: 50.08 10*3/uL (ref 33.70–90.70)
WBC: 12.1 10*3/uL — AB (ref 3.9–10.3)

## 2014-04-01 LAB — FERRITIN CHCC: FERRITIN: 220 ng/mL (ref 9–269)

## 2014-04-02 ENCOUNTER — Ambulatory Visit (INDEPENDENT_AMBULATORY_CARE_PROVIDER_SITE_OTHER): Payer: Medicare Other | Admitting: Adult Health

## 2014-04-02 ENCOUNTER — Encounter: Payer: Self-pay | Admitting: Adult Health

## 2014-04-02 VITALS — BP 110/78 | HR 103 | Temp 98.8°F | Ht 62.0 in | Wt 301.0 lb

## 2014-04-02 DIAGNOSIS — IMO0002 Reserved for concepts with insufficient information to code with codable children: Secondary | ICD-10-CM

## 2014-04-02 DIAGNOSIS — G4733 Obstructive sleep apnea (adult) (pediatric): Secondary | ICD-10-CM

## 2014-04-02 DIAGNOSIS — J45998 Other asthma: Secondary | ICD-10-CM

## 2014-04-02 DIAGNOSIS — J9611 Chronic respiratory failure with hypoxia: Secondary | ICD-10-CM

## 2014-04-02 NOTE — Patient Instructions (Addendum)
Keep CPAP At bedtime  With oxygen .  May stop oxygen with activity .  Weight loss.  Will obtain Download results and call with recommendations Follow up Dr. Sood  In 4 months and As needed    

## 2014-04-06 ENCOUNTER — Ambulatory Visit (INDEPENDENT_AMBULATORY_CARE_PROVIDER_SITE_OTHER): Payer: Medicare Other | Admitting: Adult Health

## 2014-04-06 ENCOUNTER — Encounter: Payer: Self-pay | Admitting: Adult Health

## 2014-04-06 VITALS — BP 103/61 | HR 122 | Ht 62.0 in | Wt 299.2 lb

## 2014-04-06 DIAGNOSIS — E669 Obesity, unspecified: Secondary | ICD-10-CM

## 2014-04-06 DIAGNOSIS — G43019 Migraine without aura, intractable, without status migrainosus: Secondary | ICD-10-CM

## 2014-04-06 NOTE — Patient Instructions (Signed)
Continue Topamax 100 mg BID.  Continue to follow-up with your pain management and sleep specialist. You are on medication that can cause sedation/drowsiness be careful driving after taking this medications.  If your symptoms worsen or you develop new symptoms please let us know.
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
## 2014-04-07 NOTE — Progress Notes (Signed)
   Subjective:    Patient ID: Cheyenne Arias, female    DOB: Aug 25, 1975, 38 y.o.   MRN: [id number]  HPI 38 yo female with  asthma, OSA, OHS.  04/02/14 Follow up  Pt returns for follow up . Says she is wearing CPAP every night with O2.  We discussed getting download . Her machine does not have a chip. We are getting in touch with DME to get chip for her machine.  She needs recertification for oxygen (DME: [employer]);  Walk in office today pt did not desaturate on RA.  Doing okay with her asthma, no flare in cough/wheezing .  Denies increased SABA use.  Remains on pulmicort neb , singulair.  No longer on steroids .   she denies any chest pain, orthopnea, PND, or increased leg swelling.   Tests: PSG 03/27/11 >> AHI 10.9 Echo 09/20/11 > >mod LVH, EF 65 to 70%, small/mod pericardial effusion  Doppler legs 09/21/11 >> no DVT CT chest 09/22/11 >> no PE, Lt base ATX/ASD, borderline cardiomegaly Spirometry 01/10/12 >> FEV1 1.72 (71%), FEV1% 77 CT sinus 02/28/12 >> clear paranasal sinuses RAST 02/28/12 >> IgE 15.6, moderate reaction to [facility] CT chest 04/03/12 >> ASD superior segment LLL, RLL ATX PFT 12/17/12 >> FEV1 2.14 (95%), FEV1% 88, TLC 2.62 (58%), DLCO 96%, no BD CPAP 03/01/13 to 03/30/13 >> Used on 18 of 30 nights with average 5 hrs 58 minutes.  Average AHI 0.7 with CPAP 10 cm H2O.    Review of Systems Constitutional:   No  weight loss, night sweats,  Fevers, chills,  +fatigue, or  lassitude.  HEENT:   No headaches,  Difficulty swallowing,  Tooth/dental problems, or  Sore throat,                No sneezing, itching, ear ache, nasal congestion, post nasal drip,   CV:  No chest pain,  Orthopnea, PND, swelling in lower extremities, anasarca, dizziness, palpitations, syncope.   GI  No heartburn, indigestion, abdominal pain, nausea, vomiting, diarrhea, change in bowel habits, loss of appetite, bloody stools.   Resp:   No chest wall deformity  Skin: no rash or lesions.  GU: no dysuria,  change in color of urine, no urgency or frequency.  No flank pain, no hematuria   MS:  No joint pain or swelling.  No decreased range of motion.  No back pain.  Psych:  No change in mood or affect. No depression or anxiety.  No memory loss.         Objective:   Physical Exam GEN: A/Ox3; pleasant , NAD, obese   HEENT:  [place]/AT,  EACs-clear, TMs-wnl, NOSE-clear, THROAT-clear, no lesions, no postnasal drip or exudate noted.   NECK:  Supple w/ fair ROM; no JVD; normal carotid impulses w/o bruits; no thyromegaly or nodules palpated; no lymphadenopathy.  RESP  Clear  P & A; w/o, wheezes/ rales/ or rhonchi.no accessory muscle use, no dullness to percussion  CARD:  RRR, no m/r/g  , no peripheral edema, pulses intact, no cyanosis or clubbing.  GI:   Soft & nt; nml bowel sounds; no organomegaly or masses detected.  Musco: Warm bil, no deformities or joint swelling noted.   Neuro: alert, no focal deficits noted.    Skin: Warm, no lesions or rashes         Assessment & Plan:

## 2014-04-07 NOTE — Assessment & Plan Note (Signed)
Keep CPAP At bedtime  With oxygen .  May stop oxygen with activity .  Weight loss.  Will obtain Download results and call with recommendations Follow up Dr. Halford Chessman  In 4 months and As needed

## 2014-04-07 NOTE — Assessment & Plan Note (Signed)
Controlled on rx   Plan  No changes

## 2014-04-07 NOTE — Assessment & Plan Note (Signed)
OHA/OSA  No ambulatory desats on RA ,   Plan  Cont o2 at night with CPAP

## 2014-04-08 ENCOUNTER — Ambulatory Visit: Payer: Medicaid Other | Admitting: Adult Health

## 2014-04-08 ENCOUNTER — Other Ambulatory Visit: Payer: Self-pay | Admitting: Otolaryngology

## 2014-04-08 DIAGNOSIS — R591 Generalized enlarged lymph nodes: Secondary | ICD-10-CM

## 2014-04-08 DIAGNOSIS — K118 Other diseases of salivary glands: Secondary | ICD-10-CM

## 2014-04-12 ENCOUNTER — Telehealth: Payer: Self-pay | Admitting: Adult Health

## 2014-04-12 NOTE — Telephone Encounter (Signed)
I received fax for Dr. Zenia Resides office regarding the patients last office note ( which was 01/14/14). According to the note there was no papilledema noted bilaterally.

## 2014-04-13 ENCOUNTER — Telehealth: Payer: Self-pay | Admitting: Adult Health

## 2014-04-13 ENCOUNTER — Ambulatory Visit
Admission: RE | Admit: 2014-04-13 | Discharge: 2014-04-13 | Disposition: A | Discharge status: Home / Self Care | Payer: Medicare Other | Source: Ambulatory Visit | Attending: Otolaryngology | Admitting: Otolaryngology

## 2014-04-13 DIAGNOSIS — K118 Other diseases of salivary glands: Secondary | ICD-10-CM

## 2014-04-13 DIAGNOSIS — R591 Generalized enlarged lymph nodes: Secondary | ICD-10-CM

## 2014-04-13 NOTE — Telephone Encounter (Signed)
Pt is calling wanting to talk with [facility] regarding notes from Dr. Erenest Rasher office and pt has been having problems controlling her bladder and bowels and having numbness and tingling in hands and feet and it's extremely painful.  She is having vertigo and double vision. Please call and advise.

## 2014-04-13 NOTE — Telephone Encounter (Signed)
I called the patient and left a message on her cell phone. I will try to call her again in the morning.

## 2014-04-14 ENCOUNTER — Telehealth: Payer: Self-pay | Admitting: Adult Health

## 2014-04-14 NOTE — Telephone Encounter (Signed)
I called the patient and left a message. If the patient calls back please get a time that is convenient for [place] to reach her.

## 2014-04-14 NOTE — Telephone Encounter (Signed)
I called the patient's home phone [phone] with no answer. Was unable to leave a voicemail due to it being "full." I called her cell phone [phone] and left a voice message for her to call our office at her convenience.

## 2014-04-14 NOTE — Telephone Encounter (Signed)
Megan:  Please let patient come in to your next available for [place] to evaluate her.

## 2014-04-14 NOTE — Progress Notes (Signed)
I agree above plan. 

## 2014-04-14 NOTE — Telephone Encounter (Signed)
I called the patient. She states that she has been having trouble controlling the bowels and bladder. She states that this started "a while back." She states that when she has to urinate if she doesn't get to a bathroom right away she will have an accident. She states that on 3 occasions she has lost control of her bowels and had accidents in public. She has spoken to her PCP about this and states that he didn't give her an answer. She also spoke to her OBGYN who suggested that she do Kegel exercises. She does have chronic back pain that is managed by a pain clinic. She states that she just recently had an epidural steroid injection. She has had scans of the back completed by the pain center. I have requested that she have those sent to our office for review. She is also complaining of numbness and tingling in the hands and feet. She states this has been ongoing but has recently  gotten worse. She was diagnosed with carpal tunnel syndrome and had surgery to repair this in the past. She is on Topamax but states that it has just recently gotten worse. She states that she will have sharp electric like pain in her hands and feet as well as numbness.   Once she has her MRI scans sent to [place] I will review these with Dr. Krista Blue and at that time we will discuss her plan of care. If additional scans are needed we will order at that time. Patient is amendable to this plan.

## 2014-04-14 NOTE — Telephone Encounter (Signed)
Pt called back to return Megan's call.  Please call her back @ [phone], she states she is on her way now to have injections in her back, but to call and you can leave a message.

## 2014-04-14 NOTE — Telephone Encounter (Signed)
error 

## 2014-04-15 NOTE — Telephone Encounter (Signed)
Cheyenne Arias, please call the patient and see if she can come in at the end of next week if I have openings? Also tell her to have her scans sent to [place] or with her by that visit so we can review them. Thanks.

## 2014-04-19 ENCOUNTER — Telehealth: Payer: Self-pay | Admitting: *Deleted

## 2014-04-19 NOTE — Telephone Encounter (Signed)
Called patient and got it rescheduled she will get paperwork before  She comes

## 2014-04-20 ENCOUNTER — Ambulatory Visit (INDEPENDENT_AMBULATORY_CARE_PROVIDER_SITE_OTHER): Payer: Medicare Other | Admitting: Adult Health

## 2014-04-20 ENCOUNTER — Encounter: Payer: Self-pay | Admitting: Adult Health

## 2014-04-20 VITALS — BP 113/63 | HR 93 | Ht 62.0 in | Wt 292.8 lb

## 2014-04-20 DIAGNOSIS — Z8739 Personal history of other diseases of the musculoskeletal system and connective tissue: Secondary | ICD-10-CM

## 2014-04-20 DIAGNOSIS — R32 Unspecified urinary incontinence: Secondary | ICD-10-CM

## 2014-04-20 DIAGNOSIS — R159 Full incontinence of feces: Secondary | ICD-10-CM

## 2014-04-20 DIAGNOSIS — M549 Dorsalgia, unspecified: Secondary | ICD-10-CM

## 2014-04-20 DIAGNOSIS — G8929 Other chronic pain: Secondary | ICD-10-CM

## 2014-04-20 NOTE — Progress Notes (Signed)
PATIENT: Cheyenne Arias DOB: 07/07/1975  REASON FOR VISIT: follow up HISTORY FROM: patient  HISTORY OF PRESENT ILLNESS:  [name] is a 39 year old female with a history of migraines, fibromyalgia and chronic low back pain. The patient returns today complaining of bladder and bowel incontinence that has been going on very about 6 months She states that it is getting worse. She states that she spent a year in a wheelchair due to back pain. She states that she started doing water aerobics and slowly she got better. Then things started declining again.Cheyenne Arias She was sitting in the car and had the thought that she needed to have a bowel movement and then it just happened. She states that has happened about 4 times over about 6 months. She states that she has bladder incontinence that happens daily. She will not wear depends because she feels that its humiliating. She states that she normally stays at home to prevent this from happening in public. She has ongoing back pain and sees a pain specialist. She recent got epidural steroid injections. She gets these about every two months. She has numbness and tingling in the hands and feet that have been ongoing but is getting worse. She had bilateral carpal tunnel syndrome and has had release surgery in the past. The patient currently has 3 children, 70 year old, 4 year old in a 69-year-old. She did not have any of the incontinence issues while pregnant. She has seen her OB/GYN who recommended a Kegel exercises. She returns today for a follow-up.  REVIEW OF SYSTEMS: Out of a complete 14 system review of symptoms, the patient complains only of the following symptoms, and all other reviewed systems are negative.  Chills, fatigue, double vision, blurred vision, ringing in ears, chest pain, leg swelling, palpitation, constipation, diarrhea, nausea, vomiting, incontinence of bowels, insomnia, apnea, frequent waking, daytime sleepiness, snoring, sleep talking, acting  out dreams, difficulty urinating, incontinence of bladder, frequency of urination, blood in urine with UTI, urgency, joint pain, back pain, aching muscles, muscle cramps, walking difficulty, neck pain, neck stiffness, swollen lymph nodes, anemia, memory loss, dizziness, headache, numbness, weakness, passing out, agitation, decreased concentration, depression, nervous/anxious  ALLERGIES: Allergies  Allergen Reactions  . Latex Itching and Swelling    Blisters  . Morphine And Related Itching    HOME MEDICATIONS: Outpatient Prescriptions Prior to Visit  Medication Sig Dispense Refill  . albuterol (PROVENTIL HFA;VENTOLIN HFA) 108 (90 BASE) MCG/ACT inhaler Inhale 2 puffs into the lungs every 6 (six) hours as needed for wheezing or shortness of breath. 8.5 g 5  . amLODipine (NORVASC) 10 MG tablet Take 10 mg by mouth daily.    . budesonide (PULMICORT) 0.5 MG/2ML nebulizer solution 1 vial twice a day Dx 493.90 120 mL 5  . cephALEXin (KEFLEX) 500 MG capsule Take 1 capsule (500 mg total) by mouth 4 (four) times daily. 28 capsule 0  . clonazePAM (KLONOPIN) 0.5 MG tablet Take 0.5 mg by mouth 2 (two) times daily as needed for anxiety.     . cyclobenzaprine (FLEXERIL) 10 MG tablet Take 10 mg by mouth 3 (three) times daily as needed. For muscle spasms    . Diclofenac Potassium 50 MG PACK Take 50 mg by mouth as needed. 30 each 6  . ferumoxytol (FERAHEME) 510 MG/17ML SOLN injection Inject 510 mg into the vein every 3 (three) months.    Cheyenne Arias FLUoxetine (PROZAC) 20 MG tablet Take 20 mg by mouth daily.    . fluticasone (FLONASE) 50 MCG/ACT nasal  spray Place 2 sprays into both nostrils 2 (two) times daily. 16 g 5  . furosemide (LASIX) 40 MG tablet Take 1 tablet (40 mg total) by mouth daily. 10 tablet 1  . ibuprofen (ADVIL,MOTRIN) 800 MG tablet Take 800 mg by mouth 3 (three) times daily.    Cheyenne Arias ipratropium-albuterol (DUONEB) 0.5-2.5 (3) MG/3ML SOLN Take 3 mLs by nebulization every 4 (four) hours as needed (for  wheezing). 360 mL 5  . Lactulose 20 GM/30ML SOLN Take 15-30 mLs by mouth daily.    Cheyenne Arias loratadine (CLARITIN) 10 MG tablet Take 1 tablet (10 mg total) by mouth daily. 31 tablet 12  . metoprolol tartrate (LOPRESSOR) 25 MG tablet Take 1 tablet (25 mg total) by mouth 2 (two) times daily. 360 tablet 6  . montelukast (SINGULAIR) 10 MG tablet TAKE 1 TABLET (10 MG TOTAL) BY MOUTH AT BEDTIME. 30 tablet 2  . Multiple Vitamins-Minerals (MULTIVITAMIN WITH MINERALS) tablet Take 1 tablet by mouth daily.    . Oxycodone HCl 10 MG TABS Take 10 mg by mouth 3 (three) times daily.    . OXYGEN-HELIUM IN by Other route. Uses 2 Liters of Oxygen Continuously    . pantoprazole (PROTONIX) 40 MG tablet Take 1 tablet (40 mg total) by mouth daily. 30 tablet 5  . potassium chloride SA (K-DUR,KLOR-CON) 20 MEQ tablet Take 20 mEq by mouth 2 (two) times daily.    . pregabalin (LYRICA) 75 MG capsule Take 75 mg by mouth 2 (two) times daily.    . rizatriptan (MAXALT) 10 MG tablet Take 1 tablet (10 mg total) by mouth daily as needed. May repeat in 2 hours if needed 15 tablet 11  . topiramate (TOPAMAX) 100 MG tablet bid 60 tablet 11  . zolpidem (AMBIEN) 5 MG tablet Take 5 mg by mouth at bedtime as needed for sleep.      No facility-administered medications prior to visit.    PAST MEDICAL HISTORY: Past Medical History  Diagnosis Date  . Hypertension   . Migraine   . Anxiety   . Abnormal Pap smear   . Anemia   . Fibroids   . Fibromyalgia     degenerative disc disease  . Depression   . Sleep apnea   . Shortness of breath   . On home oxygen therapy     2 liters [name]  . PONV (postoperative nausea and vomiting)   . Low iron     pt states low iron-for Feraheme today in [facility]  . Morbid obesity with BMI of 45.0-49.9, adult   . Iron deficiency anemia 04/24/2012  . Microcytic anemia 11/04/2013  . Unspecified deficiency anemia 11/04/2013  . Alpha thalassemia   . Asthma     recent admission with exacerbation of asthma 09/15/11     PAST SURGICAL HISTORY: Past Surgical History  Procedure Laterality Date  . Wisdom tooth extraction    . Mandible surgery  2008  . Carpal tunnel release    . Cesarean section      1994, 1996   . Cesarean section  11/28/2011    Procedure: CESAREAN SECTION;  Surgeon: Woodroe Mode, MD;  Location: [facility];  Service: Obstetrics;  Laterality: N/A;    FAMILY HISTORY: Family History  Problem Relation Age of Onset  . Sickle cell trait Maternal Aunt   . Sickle cell anemia Other     SOCIAL HISTORY: History   Social History  . Marital Status: Single    Spouse Name: N/A    Number of  Children: N/A  . Years of Education: N/A   Occupational History  . Not on file.   Social History Main Topics  . Smoking status: Never Smoker   . Smokeless tobacco: Never Used     Comment: Pt was exposed to 2nd hand smoke at work years ago.  . Alcohol Use: No  . Drug Use: No  . Sexual Activity: Yes    Birth Control/ Protection: None     Comment: 2 months ago   Other Topics Concern  . Not on file   Social History Narrative      PHYSICAL EXAM  Filed Vitals:   04/20/14 1400  BP: 113/63  Pulse: 93  Height: 5\' 2"  (1.575 m)  Weight: 292 lb 12.8 oz (132.813 kg)   Body mass index is 53.54 kg/(m^2).  Generalized: Well developed, in no acute distress   Neurological examination  Mentation: Alert oriented to time, place, history taking. Follows all commands speech and language fluent Cranial nerve II-XII: Pupils were equal round reactive to light. Extraocular movements were full, visual field were full on confrontational test. Facial sensation and strength were normal. Uvula tongue midline. Head turning and shoulder shrug  were normal and symmetric. Motor: The motor testing reveals 5 over 5 strength of all 4 extremities. Good symmetric motor tone is noted throughout.  Sensory: Sensory testing is intact to soft touch on all 4 extremities. No evidence of extinction is noted.  Coordination:  Cerebellar testing reveals good finger-nose-finger and heel-to-shin bilaterally.  Gait and station: Gait is normal. Tandem gait unsteady. Unable to perform walking on the heels or toes. Romberg is negative. No drift is seen.  Reflexes: Deep tendon reflexes are symmetric and normal bilaterally, brisk in bilateral lower extremities.    DIAGNOSTIC DATA (LABS, IMAGING, TESTING) - I reviewed patient records, labs, notes, testing and imaging myself where available.  Lab Results  Component Value Date   WBC 12.1* 04/01/2014   HGB 11.4* 04/01/2014   HCT 36.9 04/01/2014   MCV 78.8* 04/01/2014   PLT 382 04/01/2014      Component Value Date/Time   NA 139 01/06/2014 0800   NA 139 08/29/2012 1102   K 4.0 01/06/2014 0800   K 3.2* 08/29/2012 1102   CL 99 01/06/2014 0800   CL 105 08/29/2012 1102   CO2 26 01/06/2014 0800   CO2 23 08/29/2012 1102   GLUCOSE 129* 01/06/2014 0800   GLUCOSE 130* 08/29/2012 1102   BUN 14 01/06/2014 0800   BUN 5.8* 08/29/2012 1102   CREATININE 1.30* 01/06/2014 0800   CREATININE 0.8 08/29/2012 1102   CREATININE 0.49* 07/12/2011 1346   CREATININE 0.49* 07/12/2011 1346   CALCIUM 9.2 01/06/2014 0800   CALCIUM 8.6 08/29/2012 1102   PROT 7.7 01/06/2014 0800   PROT 7.3 08/29/2012 1102   ALBUMIN 3.8 01/06/2014 0800   ALBUMIN 3.6 08/29/2012 1102   AST 39* 01/06/2014 0800   AST 22 08/29/2012 1102   ALT 30 01/06/2014 0800   ALT 23 08/29/2012 1102   ALKPHOS 90 01/06/2014 0800   ALKPHOS 86 08/29/2012 1102   BILITOT <0.2* 01/06/2014 0800   BILITOT 0.22 08/29/2012 1102   GFRNONAA 51* 01/06/2014 0800   GFRAA 59* 01/06/2014 0800    Lab Results  Component Value Date   VITAMINB12 351 12/04/2013   Lab Results  Component Value Date   TSH 1.596 09/25/2011      ASSESSMENT AND PLAN 39 y.o. year old female  has a past medical history of Hypertension; Migraine;  Anxiety; Abnormal Pap smear; Anemia; Fibroids; Fibromyalgia; Depression; Sleep apnea; Shortness of breath; On  home oxygen therapy; PONV (postoperative nausea and vomiting); Low iron; Morbid obesity with BMI of 45.0-49.9, adult; Iron deficiency anemia (04/24/2012); Microcytic anemia (11/04/2013); Unspecified deficiency anemia (11/04/2013); Alpha thalassemia; and Asthma. here with:  1. Incontinence of bladder and bowel 2. Chronic back pain 3. Migraines 4. History of fibromyalgia  The patient returns today due to complaint of incontinence of bladder and bowels. The patient has had MRI of the lumbar spine at her pain clinic. We have asked her to have this scan faxed to [place]. I have consulted with Dr. Krista Blue regarding the patient's plan of care. Dr. Krista Blue did examine the patient. At this time we will do an MRI of the cervical and thoracic spine looking for causes of bladder and bowel incontinence. If these scans do not reveal the cause of bladder or bowel incontinence the patient will need to follow up with an OB/GYN. The patient verbalized understanding. She will follow up in March with Dr. Krista Blue.   Ward Givens, MSN, NP-C 04/20/2014, 2:14 PM [facility] [address], [place], [place] [address] [phone]  Note: This document was prepared with digital dictation and possible smart phrase technology. Any transcriptional errors that result from this process are unintentional.

## 2014-04-20 NOTE — Patient Instructions (Signed)
I will order MRI of the thoracic and cervical spine. I will call you with results.  Next visit with Dr. Krista Blue

## 2014-04-21 ENCOUNTER — Ambulatory Visit (HOSPITAL_BASED_OUTPATIENT_CLINIC_OR_DEPARTMENT_OTHER): Payer: Medicare Other | Admitting: Hematology and Oncology

## 2014-04-21 ENCOUNTER — Encounter: Payer: Self-pay | Admitting: Hematology and Oncology

## 2014-04-21 ENCOUNTER — Telehealth: Payer: Self-pay | Admitting: Hematology and Oncology

## 2014-04-21 VITALS — BP 155/80 | HR 104 | Temp 98.0°F | Resp 20 | Ht 62.0 in | Wt 291.6 lb

## 2014-04-21 DIAGNOSIS — R32 Unspecified urinary incontinence: Secondary | ICD-10-CM | POA: Insufficient documentation

## 2014-04-21 DIAGNOSIS — D509 Iron deficiency anemia, unspecified: Secondary | ICD-10-CM

## 2014-04-21 NOTE — Progress Notes (Signed)
[place] NOTE  Cheyenne Downing, MD SUMMARY OF HEMATOLOGIC HISTORY:  This patient have chronic fatigue, fibromyalgia, uterine fibroids with chronic menorrhagia, severe iron deficiency and reactive thrombocytosis. She had chronic microcytic anemia for many years. In fact, there were no changes with MCV and the highest hemoglobin was 10.7. She had received intermittent intravenous iron infusion in the past, her last infusion was in January 2014. The patient could not tolerate oral iron supplements. She have chronic fatigue with fibromyalgia, obstructive sleep apnea and have been oxygen therapy for 2 years. She takes regular ibuprofen for pelvic pain. The patient had history of intermittent epistaxis for many years, usually coming from the left nasal passage. The patient has severe microcytic anemia. She received one dose of iron infusion on 11/04/2013  INTERVAL HISTORY: Cheyenne Arias 39 y.o. female returns for further follow-up. She complained of recent onset of urinary incontinence. The patient denies any recent signs or symptoms of bleeding such as spontaneous epistaxis, hematuria or hematochezia.   I have reviewed the past medical history, past surgical history, social history and family history with the patient and they are unchanged from previous note.  ALLERGIES:  is allergic to latex and morphine and related.  MEDICATIONS:  Current Outpatient Prescriptions  Medication Sig Dispense Refill  . albuterol (PROVENTIL HFA;VENTOLIN HFA) 108 (90 BASE) MCG/ACT inhaler Inhale 2 puffs into the lungs every 6 (six) hours as needed for wheezing or shortness of breath. 8.5 g 5  . amLODipine (NORVASC) 10 MG tablet Take 10 mg by mouth daily.    . budesonide (PULMICORT) 0.5 MG/2ML nebulizer solution 1 vial twice a day Dx 493.90 120 mL 5  . cephALEXin (KEFLEX) 500 MG capsule Take 1 capsule (500 mg total) by mouth 4 (four) times daily. 28 capsule 0  .  clonazePAM (KLONOPIN) 0.5 MG tablet Take 0.5 mg by mouth 2 (two) times daily as needed for anxiety.     . cyclobenzaprine (FLEXERIL) 10 MG tablet Take 10 mg by mouth 3 (three) times daily as needed. For muscle spasms    . Diclofenac Potassium 50 MG PACK Take 50 mg by mouth as needed. 30 each 6  . ferumoxytol (FERAHEME) 510 MG/17ML SOLN injection Inject 510 mg into the vein every 3 (three) months.    [name] FLUoxetine (PROZAC) 20 MG tablet Take 20 mg by mouth daily.    . fluticasone (FLONASE) 50 MCG/ACT nasal spray Place 2 sprays into both nostrils 2 (two) times daily. 16 g 5  . furosemide (LASIX) 40 MG tablet Take 1 tablet (40 mg total) by mouth daily. 10 tablet 1  . ibuprofen (ADVIL,MOTRIN) 800 MG tablet Take 800 mg by mouth 3 (three) times daily.    [name] ipratropium-albuterol (DUONEB) 0.5-2.5 (3) MG/3ML SOLN Take 3 mLs by nebulization every 4 (four) hours as needed (for wheezing). 360 mL 5  . Lactulose 20 GM/30ML SOLN Take 15-30 mLs by mouth daily.    [name] loratadine (CLARITIN) 10 MG tablet Take 1 tablet (10 mg total) by mouth daily. 31 tablet 12  . metoprolol tartrate (LOPRESSOR) 25 MG tablet Take 1 tablet (25 mg total) by mouth 2 (two) times daily. 360 tablet 6  . montelukast (SINGULAIR) 10 MG tablet TAKE 1 TABLET (10 MG TOTAL) BY MOUTH AT BEDTIME. 30 tablet 2  . Multiple Vitamins-Minerals (MULTIVITAMIN WITH MINERALS) tablet Take 1 tablet by mouth daily.    . Oxycodone HCl 10 MG TABS Take 10 mg by mouth 3 (three) times daily.    [name]  OXYGEN-HELIUM IN by Other route. Uses 2 Liters of Oxygen Continuously    . pantoprazole (PROTONIX) 40 MG tablet Take 1 tablet (40 mg total) by mouth daily. 30 tablet 5  . potassium chloride SA (K-DUR,KLOR-CON) 20 MEQ tablet Take 20 mEq by mouth 2 (two) times daily.    . pregabalin (LYRICA) 75 MG capsule Take 75 mg by mouth 2 (two) times daily.    . rizatriptan (MAXALT) 10 MG tablet Take 1 tablet (10 mg total) by mouth daily as needed. May repeat in 2 hours if needed 15 tablet  11  . topiramate (TOPAMAX) 100 MG tablet bid 60 tablet 11  . zolpidem (AMBIEN) 5 MG tablet Take 5 mg by mouth at bedtime as needed for sleep.      No current facility-administered medications for this visit.     REVIEW OF SYSTEMS:   Constitutional: Denies fevers, chills or night sweats Eyes: Denies blurriness of vision Ears, nose, mouth, throat, and face: Denies mucositis or sore throat Respiratory: Denies cough, dyspnea or wheezes Cardiovascular: Denies palpitation, chest discomfort or lower extremity swelling Gastrointestinal:  Denies nausea, heartburn or change in bowel habits Skin: Denies abnormal skin rashes Lymphatics: Denies new lymphadenopathy or easy bruising Neurological:Denies numbness, tingling or new weaknesses Behavioral/Psych: Mood is stable, no new changes  All other systems were reviewed with the patient and are negative.  PHYSICAL EXAMINATION: ECOG PERFORMANCE STATUS: 1 - Symptomatic but completely ambulatory  Filed Vitals:   04/21/14 0910  BP: 155/80  Pulse: 104  Temp: 98 F (36.7 C)  Resp: 20   Filed Weights   04/21/14 0910  Weight: 291 lb 9.6 oz (132.269 kg)    GENERAL:alert, no distress and comfortable. She is morbidly obese SKIN: skin color, texture, turgor are normal, no rashes or significant lesions EYES: normal, Conjunctiva are pink and non-injected, sclera clear Musculoskeletal:no cyanosis of digits and no clubbing  NEURO: alert & oriented x 3 with fluent speech, no focal motor/sensory deficits  LABORATORY DATA:  I have reviewed the data as listed No results found for this or any previous visit (from the past 48 hour(s)).  Lab Results  Component Value Date   WBC 12.1* 04/01/2014   HGB 11.4* 04/01/2014   HCT 36.9 04/01/2014   MCV 78.8* 04/01/2014   PLT 382 04/01/2014   ASSESSMENT & PLAN:  Iron deficiency anemia I suspect the patient may have a combination of thalassemia, anemia chronic disease and iron deficiency anemia. Despite high  ferritin level, her anemia did not correct itself. Hemoglobinopathy evaluation is normal but this does not exclude alpha thalassemia. Currently, she is not symptomatic.  I plan to recheck her blood work in 1 year.    Incontinence of urine in female She was recently evaluated by Neurology with scans pending. I recommend consideration for GYN evaluation is neurological cause is excluded   All questions were answered. The patient knows to call the clinic with any problems, questions or concerns. No barriers to learning was detected.  I spent 15 minutes counseling the patient face to face. The total time spent in the appointment was 20 minutes and more than 50% was on counseling.     [facility], Cynthia Cogle, MD 04/21/2014 6:33 PM

## 2014-04-21 NOTE — Assessment & Plan Note (Signed)
She was recently evaluated by Neurology with scans pending. I recommend consideration for GYN evaluation is neurological cause is excluded

## 2014-04-21 NOTE — Telephone Encounter (Signed)
gv adn printed appt sched and avs for pt for Jan 2017 °

## 2014-04-21 NOTE — Assessment & Plan Note (Signed)
I suspect the patient may have a combination of thalassemia, anemia chronic disease and iron deficiency anemia. Despite high ferritin level, her anemia did not correct itself. Hemoglobinopathy evaluation is normal but this does not exclude alpha thalassemia. Currently, she is not symptomatic.  I plan to recheck her blood work in 1 year.

## 2014-04-23 ENCOUNTER — Other Ambulatory Visit: Payer: Self-pay | Admitting: Adult Health

## 2014-04-23 NOTE — Progress Notes (Signed)
I agree above plan. 

## 2014-04-25 ENCOUNTER — Other Ambulatory Visit: Payer: Self-pay | Admitting: Adult Health

## 2014-05-05 ENCOUNTER — Ambulatory Visit
Admission: RE | Admit: 2014-05-05 | Discharge: 2014-05-05 | Disposition: A | Discharge status: Home / Self Care | Payer: Medicare Other | Source: Ambulatory Visit | Attending: Adult Health | Admitting: Adult Health

## 2014-05-05 DIAGNOSIS — R32 Unspecified urinary incontinence: Principal | ICD-10-CM

## 2014-05-05 DIAGNOSIS — R159 Full incontinence of feces: Secondary | ICD-10-CM

## 2014-05-05 MED ORDER — GADOBENATE DIMEGLUMINE 529 MG/ML IV SOLN
20.0000 mL | Freq: Once | INTRAVENOUS | Status: AC | PRN
  Administered 2014-05-05: 20 mL via INTRAVENOUS

## 2014-05-06 ENCOUNTER — Telehealth: Payer: Self-pay | Admitting: *Deleted

## 2014-05-06 NOTE — Telephone Encounter (Signed)
Pt calling for MRI results.   Instructed that the MRI imaging studies have not been finalized. I placed her other cell # as primary for now (BOLD) to call with results when in.

## 2014-05-10 ENCOUNTER — Telehealth: Payer: Self-pay | Admitting: Adult Health

## 2014-05-10 ENCOUNTER — Encounter (HOSPITAL_COMMUNITY): Payer: Self-pay | Admitting: Psychiatry

## 2014-05-10 ENCOUNTER — Ambulatory Visit (INDEPENDENT_AMBULATORY_CARE_PROVIDER_SITE_OTHER): Payer: Medicare Other | Admitting: Psychiatry

## 2014-05-10 VITALS — BP 129/79 | HR 100 | Ht 62.0 in | Wt 291.0 lb

## 2014-05-10 DIAGNOSIS — F0630 Mood disorder due to known physiological condition, unspecified: Secondary | ICD-10-CM

## 2014-05-10 DIAGNOSIS — F331 Major depressive disorder, recurrent, moderate: Secondary | ICD-10-CM

## 2014-05-10 DIAGNOSIS — F410 Panic disorder [episodic paroxysmal anxiety] without agoraphobia: Secondary | ICD-10-CM

## 2014-05-10 MED ORDER — CLONAZEPAM 0.5 MG PO TABS: 0.5000 mg | ORAL_TABLET | Freq: Two times a day (BID) | ORAL | Status: DC | PRN

## 2014-05-10 MED ORDER — ARIPIPRAZOLE 2 MG PO TABS
2.0000 mg | ORAL_TABLET | Freq: Every day | ORAL | Status: DC
Start: 2014-05-10 — End: 2014-08-06

## 2014-05-10 MED ORDER — FLUOXETINE HCL 20 MG PO TABS: 20.0000 mg | ORAL_TABLET | Freq: Every day | ORAL | Status: DC

## 2014-05-10 NOTE — Telephone Encounter (Signed)
Patient returning Megan's call. She is available at [phone].

## 2014-05-10 NOTE — Patient Instructions (Signed)
Go to bed late instead of 7pm.  Use cpap regularly Avoid ambien with klonopine.  Avoid increasing dose of klonopine

## 2014-05-10 NOTE — Telephone Encounter (Signed)
I called the patient and left a message on her VM to call our office regarding the results of her recent MRI scans.

## 2014-05-10 NOTE — Telephone Encounter (Signed)
I called the patient. I reviewed her MRI of the cervical and thoracic spine with the patient. Those scans were unremarkable. The cervical spine shows some changes at C3-C4 but does not coincide with her symptoms. I have reviewed both of these with Dr. Krista Blue. I have encouraged the patient to bring [place] a copy of her lumbar MRI that she had at   her pain center for [place] to review. The patient verbalized understanding.

## 2014-05-10 NOTE — Telephone Encounter (Signed)
Pt is returning your call regarding her MRI results.  Please call and advise.
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
## 2014-05-24 ENCOUNTER — Telehealth: Payer: Self-pay | Admitting: *Deleted

## 2014-05-24 NOTE — Telephone Encounter (Signed)
-----   Message from Berton Mount sent at 05/21/2014  4:05 PM EST ----- Contact: [place]!  This is [place] from [place].  This patient went Medicare primary and we are needing new sats done on this patient for her oxygen. I saw where she saw Tammy Parrett back in December but it is past 30 days. Can you get patient in for offfice visit and new testing?   Call me at [phone]  Sorry you can't message me back, IT hasn't fixed that problem yet:(

## 2014-05-24 NOTE — Telephone Encounter (Signed)
This is VS pt. Will forward to [place].

## 2014-05-27 NOTE — Telephone Encounter (Signed)
LMTCB x 1 Pt needs OV with Dr Halford Chessman or TP (first available) Dr Juanetta Gosling next available is not until March - Pt will need to be set up with TP

## 2014-05-28 ENCOUNTER — Other Ambulatory Visit: Payer: Self-pay | Admitting: Physical Medicine and Rehabilitation

## 2014-05-28 DIAGNOSIS — M545 Low back pain: Secondary | ICD-10-CM

## 2014-06-01 NOTE — Telephone Encounter (Signed)
Spoke with pt- aware that [employer] is needing O2 saturations and OV notes to complete coverage of daytime O2 Pt has O2 recert in December 6599 and the patient did not desat on RA with ambulation. Pt questioning whether or not to D/C O2, states that she has been feeling pretty good but still feels like she needs to use the O2 during the daytime. I advised that based on her last OV notes, we can send an order to D/C the O2 but with her concern of still needing O2 during the daytime patient has decided to come back in and be retested. Mandy with [employer] is needing updated O2 notes with ambulatory ox (if desat noted) faxed over for insurance coverage if patient to continue using O2 during daytime. Pt aware that if she does not desat then she we can send order to D/C permanently.   Pt scheduled for OV with TP 06/11/14 at 10:45 Nothing further needed.

## 2014-06-11 ENCOUNTER — Encounter: Payer: Self-pay | Admitting: Adult Health

## 2014-06-11 ENCOUNTER — Ambulatory Visit (INDEPENDENT_AMBULATORY_CARE_PROVIDER_SITE_OTHER): Payer: Medicare Other | Admitting: Adult Health

## 2014-06-11 ENCOUNTER — Encounter (INDEPENDENT_AMBULATORY_CARE_PROVIDER_SITE_OTHER): Payer: Self-pay

## 2014-06-11 VITALS — BP 150/92 | HR 102 | Ht 62.0 in | Wt 295.4 lb

## 2014-06-11 DIAGNOSIS — J9611 Chronic respiratory failure with hypoxia: Secondary | ICD-10-CM

## 2014-06-11 DIAGNOSIS — J45998 Other asthma: Secondary | ICD-10-CM

## 2014-06-11 DIAGNOSIS — IMO0002 Reserved for concepts with insufficient information to code with codable children: Secondary | ICD-10-CM

## 2014-06-11 DIAGNOSIS — G4733 Obstructive sleep apnea (adult) (pediatric): Secondary | ICD-10-CM

## 2014-06-11 NOTE — Assessment & Plan Note (Signed)
Controlled on rx   

## 2014-06-11 NOTE — Assessment & Plan Note (Signed)
No desats with ambulation  D/c act O2  Check ONO on CPAP on RA   Plan  Keep CPAP At bedtime and naps Do not drive if sleepy.  We are setting you up for an oxygen check with CPAP on room air .  Weight loss.  May stop oxygen with activity .  Follow up Dr. Halford Chessman  In 3 months and As needed

## 2014-06-11 NOTE — Patient Instructions (Signed)
Keep CPAP At bedtime and naps Do not drive if sleepy.  We are setting you up for an oxygen check with CPAP on room air .  Please take card /machine for CPAP download.  Weight loss.  May stop oxygen with activity .  Follow up Dr. Halford Chessman  In 3 months and As needed

## 2014-06-11 NOTE — Progress Notes (Signed)
Subjective:    Patient ID: Cheyenne Arias, female    DOB: 26-Apr-1975, 39 y.o.   MRN: [id number]  HPI 39 yo female with  asthma, OSA, OHS.  04/02/14 Follow up  Pt returns for follow up . Says she is wearing CPAP every night with O2.  We discussed getting download . Her machine does not have a chip. We are getting in touch with DME to get chip for her machine.  She needs recertification for oxygen (DME: [employer]);  Walk in office today pt did not desaturate on RA.  Doing okay with her asthma, no flare in cough/wheezing .  Denies increased SABA use.  Remains on pulmicort neb , singulair.  No longer on steroids .   she denies any chest pain, orthopnea, PND, or increased leg swelling.  06/11/2014 Follow up: Asthma/OSA on CPAP  Returns for follow up  DME company requiring certification for O2 .  Last ov with desats with walking . O2 d/c but pt wanted to keep it.  Says she gets winded with walking, wears out with w/ legs wear out.  No desats with walking, O2 sats 98-100%   Says she is wearing CPAP each night for several hours Has chip in machine now, encouraged her to send in for download.  Encouraged on wt loss. Wt is up 30lbs over last 1 yr.  Feels tired complains of fatigue and fibromyalgia .  Has trouble sleeping most night, falling asleep.  Was on ambien in past, says PCP will not give to her.  We discussed some healthy sleep regimen tips.    Remains on pulmicort neb Twice daily   No flare of cough or wheezing .     Tests: PSG 03/27/11 >> AHI 10.9 Echo 09/20/11 > >mod LVH, EF 65 to 70%, small/mod pericardial effusion  Doppler legs 09/21/11 >> no DVT CT chest 09/22/11 >> no PE, Lt base ATX/ASD, borderline cardiomegaly Spirometry 01/10/12 >> FEV1 1.72 (71%), FEV1% 77 CT sinus 02/28/12 >> clear paranasal sinuses RAST 02/28/12 >> IgE 15.6, moderate reaction to [facility] CT chest 04/03/12 >> ASD superior segment LLL, RLL ATX PFT 12/17/12 >> FEV1 2.14 (95%), FEV1% 88, TLC 2.62 (58%),  DLCO 96%, no BD CPAP 03/01/13 to 03/30/13 >> Used on 18 of 30 nights with average 5 hrs 58 minutes.  Average AHI 0.7 with CPAP 10 cm H2O.    Review of Systems Constitutional:   No  weight loss, night sweats,  Fevers, chills,  +fatigue, or  lassitude.  HEENT:   No headaches,  Difficulty swallowing,  Tooth/dental problems, or  Sore throat,                No sneezing, itching, ear ache, nasal congestion, post nasal drip,   CV:  No chest pain,  Orthopnea, PND, swelling in lower extremities, anasarca, dizziness, palpitations, syncope.   GI  No heartburn, indigestion, abdominal pain, nausea, vomiting, diarrhea, change in bowel habits, loss of appetite, bloody stools.   Resp:   No chest wall deformity  Skin: no rash or lesions.  GU: no dysuria, change in color of urine, no urgency or frequency.  No flank pain, no hematuria   MS:  No joint pain or swelling.  No decreased range of motion.  No back pain.  Psych:  No memory loss.         Objective:   Physical Exam GEN: A/Ox3; pleasant , NAD, obese   HEENT:  [place]/AT,  EACs-clear, TMs-wnl, NOSE-clear, THROAT-clear, no lesions, no  postnasal drip or exudate noted Class 3 airway.   NECK:  Supple w/ fair ROM; no JVD; normal carotid impulses w/o bruits; no thyromegaly or nodules palpated; no lymphadenopathy.  RESP  Clear  P & A; w/o, wheezes/ rales/ or rhonchi.no accessory muscle use, no dullness to percussion  CARD:  RRR, no m/r/g  , no peripheral edema, pulses intact, no cyanosis or clubbing.  GI:   Soft & nt; nml bowel sounds; no organomegaly or masses detected.  Musco: Warm bil, no deformities or joint swelling noted.   Neuro: alert, no focal deficits noted.    Skin: Warm, no lesions or rashes         Assessment & Plan:

## 2014-06-11 NOTE — Assessment & Plan Note (Signed)
Keep CPAP At bedtime and naps Do not drive if sleepy.  We are setting you up for an oxygen check with CPAP on room air .  Please take card /machine for CPAP download.  Weight loss.  May stop oxygen with activity .  Follow up Dr. Halford Chessman  In 3 months and As needed

## 2014-06-17 ENCOUNTER — Ambulatory Visit
Admission: RE | Admit: 2014-06-17 | Discharge: 2014-06-17 | Disposition: A | Discharge status: Home / Self Care | Payer: Medicare Other | Source: Ambulatory Visit | Attending: Physical Medicine and Rehabilitation | Admitting: Physical Medicine and Rehabilitation

## 2014-06-17 DIAGNOSIS — M545 Low back pain: Secondary | ICD-10-CM

## 2014-06-29 ENCOUNTER — Other Ambulatory Visit: Payer: Self-pay | Admitting: Adult Health

## 2014-07-06 ENCOUNTER — Ambulatory Visit: Payer: Medicare Other | Admitting: Neurology

## 2014-07-09 ENCOUNTER — Ambulatory Visit (HOSPITAL_COMMUNITY): Payer: Self-pay | Admitting: Psychiatry

## 2014-07-13 ENCOUNTER — Telehealth: Payer: Self-pay | Admitting: Adult Health

## 2014-07-13 DIAGNOSIS — G4733 Obstructive sleep apnea (adult) (pediatric): Secondary | ICD-10-CM

## 2014-07-13 NOTE — Telephone Encounter (Signed)
Received 3.17.16 ONO on CPAP on room air results Per TP: continue on nocturnal O2 w/ CPAP  Order sent to [facility]

## 2014-07-29 ENCOUNTER — Other Ambulatory Visit (HOSPITAL_COMMUNITY): Payer: Self-pay | Admitting: Psychiatry

## 2014-07-30 ENCOUNTER — Telehealth: Payer: Self-pay | Admitting: *Deleted

## 2014-07-30 NOTE — Telephone Encounter (Signed)
"  I need to speak with Dr. Alvy Bimler.  My neurologist says I need to follow up with Dr. Alvy Bimler because my iron levels are low."  Called patient to inquire who neurologist is, when labs were drawn and will results be faxed to [facility].  Next scheduled F/U is January 2017.  On 04-01-2014 ferritin = 220. Dr. Theophilus Kinds with [facility] did not share with me any plans to fax results."  This nurse asked her to call and give Triage fax number [phone] for results to be faxed to Dr. Alvy Bimler.  "For the last couple of months I've gotten worse.  I'm always tired with my fibromyalgia but I can't stay awake.  Denies any menses or signs of bleeding.  Informed her we will be in touch after receiving labs or she can be scheduled to come to [facility] for lab work.  "I will cal them now."

## 2014-07-30 NOTE — Telephone Encounter (Signed)
4-15-201 at 1830 no fax received from neurologist.

## 2014-08-02 ENCOUNTER — Emergency Department (HOSPITAL_BASED_OUTPATIENT_CLINIC_OR_DEPARTMENT_OTHER)
Admission: EM | Admit: 2014-08-02 | Discharge: 2014-08-02 | Disposition: A | Discharge status: Home / Self Care | Payer: Medicare Other | Attending: Emergency Medicine | Admitting: Emergency Medicine

## 2014-08-02 ENCOUNTER — Other Ambulatory Visit: Payer: Self-pay | Admitting: Pulmonary Disease

## 2014-08-02 ENCOUNTER — Encounter (HOSPITAL_BASED_OUTPATIENT_CLINIC_OR_DEPARTMENT_OTHER): Payer: Self-pay | Admitting: *Deleted

## 2014-08-02 DIAGNOSIS — M797 Fibromyalgia: Secondary | ICD-10-CM | POA: Diagnosis not present

## 2014-08-02 DIAGNOSIS — F419 Anxiety disorder, unspecified: Secondary | ICD-10-CM | POA: Insufficient documentation

## 2014-08-02 DIAGNOSIS — Z7951 Long term (current) use of inhaled steroids: Secondary | ICD-10-CM | POA: Insufficient documentation

## 2014-08-02 DIAGNOSIS — Z9981 Dependence on supplemental oxygen: Secondary | ICD-10-CM | POA: Diagnosis not present

## 2014-08-02 DIAGNOSIS — I10 Essential (primary) hypertension: Secondary | ICD-10-CM | POA: Insufficient documentation

## 2014-08-02 DIAGNOSIS — J45909 Unspecified asthma, uncomplicated: Secondary | ICD-10-CM | POA: Insufficient documentation

## 2014-08-02 DIAGNOSIS — Z3202 Encounter for pregnancy test, result negative: Secondary | ICD-10-CM | POA: Diagnosis not present

## 2014-08-02 DIAGNOSIS — Z862 Personal history of diseases of the blood and blood-forming organs and certain disorders involving the immune mechanism: Secondary | ICD-10-CM | POA: Diagnosis not present

## 2014-08-02 DIAGNOSIS — Z91040 Latex allergy status: Secondary | ICD-10-CM | POA: Insufficient documentation

## 2014-08-02 DIAGNOSIS — G43909 Migraine, unspecified, not intractable, without status migrainosus: Secondary | ICD-10-CM | POA: Insufficient documentation

## 2014-08-02 DIAGNOSIS — R319 Hematuria, unspecified: Secondary | ICD-10-CM | POA: Diagnosis present

## 2014-08-02 DIAGNOSIS — Z86018 Personal history of other benign neoplasm: Secondary | ICD-10-CM | POA: Insufficient documentation

## 2014-08-02 DIAGNOSIS — F329 Major depressive disorder, single episode, unspecified: Secondary | ICD-10-CM | POA: Diagnosis not present

## 2014-08-02 DIAGNOSIS — G4730 Sleep apnea, unspecified: Secondary | ICD-10-CM | POA: Diagnosis not present

## 2014-08-02 DIAGNOSIS — Z79899 Other long term (current) drug therapy: Secondary | ICD-10-CM | POA: Insufficient documentation

## 2014-08-02 LAB — URINALYSIS, ROUTINE W REFLEX MICROSCOPIC
Bilirubin Urine: NEGATIVE
Glucose, UA: NEGATIVE mg/dL
Ketones, ur: NEGATIVE mg/dL
LEUKOCYTES UA: NEGATIVE
NITRITE: NEGATIVE
PH: 6 (ref 5.0–8.0)
Protein, ur: NEGATIVE mg/dL
SPECIFIC GRAVITY, URINE: 1.017 (ref 1.005–1.030)
Urobilinogen, UA: 0.2 mg/dL (ref 0.0–1.0)

## 2014-08-02 LAB — URINE MICROSCOPIC-ADD ON

## 2014-08-02 LAB — PREGNANCY, URINE: Preg Test, Ur: NEGATIVE

## 2014-08-02 MED ORDER — CEPHALEXIN 250 MG PO CAPS
500.0000 mg | ORAL_CAPSULE | Freq: Once | ORAL | Status: AC
  Administered 2014-08-02: 500 mg via ORAL
  Filled 2014-08-02: qty 2

## 2014-08-02 MED ORDER — KETOROLAC TROMETHAMINE 60 MG/2ML IM SOLN
30.0000 mg | Freq: Once | INTRAMUSCULAR | Status: AC
  Administered 2014-08-02: 30 mg via INTRAMUSCULAR
  Filled 2014-08-02: qty 2

## 2014-08-02 MED ORDER — CEPHALEXIN 500 MG PO CAPS: 500.0000 mg | ORAL_CAPSULE | Freq: Two times a day (BID) | ORAL | Status: DC

## 2014-08-02 NOTE — Discharge Instructions (Signed)
°  Take your antibiotics as directed and to completion. You should never have any leftover antibiotics! Push fluids and stay well hydrated.   Any antibiotic use can reduce the efficacy of hormonal birth control. Please use back up method of contraception.   Please follow with your primary care doctor in the next 2 days for a check-up. They must obtain records for further management.   Do not hesitate to return to the Emergency Department for any new, worsening or concerning symptoms.    Hematuria Hematuria is blood in your urine. It can be caused by a bladder infection, kidney infection, prostate infection, kidney stone, or cancer of your urinary tract. Infections can usually be treated with medicine, and a kidney stone usually will pass through your urine. If neither of these is the cause of your hematuria, further workup to find out the reason may be needed. It is very important that you tell your health care provider about any blood you see in your urine, even if the blood stops without treatment or happens without causing pain. Blood in your urine that happens and then stops and then happens again can be a symptom of a very serious condition. Also, pain is not a symptom in the initial stages of many urinary cancers. HOME CARE INSTRUCTIONS   Drink lots of fluid, 3-4 quarts a day. If you have been diagnosed with an infection, cranberry juice is especially recommended, in addition to large amounts of water.  Avoid caffeine, tea, and carbonated beverages because they tend to irritate the bladder.  Avoid alcohol because it may irritate the prostate.  Take all medicines as directed by your health care provider.  If you were prescribed an antibiotic medicine, finish it all even if you start to feel better.  If you have been diagnosed with a kidney stone, follow your health care provider's instructions regarding straining your urine to catch the stone.  Empty your bladder often. Avoid holding  urine for long periods of time.  After a bowel movement, women should cleanse front to back. Use each tissue only once.  Empty your bladder before and after sexual intercourse if you are a female. SEEK MEDICAL CARE IF:  You develop back pain.  You have a fever.  You have a feeling of sickness in your stomach (nausea) or vomiting.  Your symptoms are not better in 3 days. Return sooner if you are getting worse. SEEK IMMEDIATE MEDICAL CARE IF:   You develop severe vomiting and are unable to keep the medicine down.  You develop severe back or abdominal pain despite taking your medicines.  You begin passing a large amount of blood or clots in your urine.  You feel extremely weak or faint, or you pass out. MAKE SURE YOU:   Understand these instructions.  Will watch your condition.  Will get help right away if you are not doing well or get worse. Document Released: 04/02/2005 Document Revised: 08/17/2013 Document Reviewed: 12/01/2012 [facility] Patient Information 2015 [place], [place]. This information is not intended to replace advice given to you by your health care provider. Make sure you discuss any questions you have with your health care provider.

## 2014-08-02 NOTE — ED Provider Notes (Signed)
CSN: [id number]     Arrival date & time 08/02/14  1638 History   First MD Initiated Contact with Patient 08/02/14 [phone]     Chief Complaint  Patient presents with  . Hematuria     (Consider location/radiation/quality/duration/timing/severity/associated sxs/prior Treatment) HPI   EMINE LOPATA is a 39 y.o. female complaining of dysuria, urinary frequency, blood in her urine worsening over the course of several days. Patient denies fever,  flank pain, nausea, vomiting, abdominal pain, abnormal vaginal discharge she states that she is having an exacerbation of her chronic low back pain secondary to scoliosis and fibromyalgia. Review of systems patient also notes chills. She states that her oxycodone 10 mg or not alleviating the pain. Patient states that she does not menstruate secondary to Implanon.  Past Medical History  Diagnosis Date  . Hypertension   . Migraine   . Anxiety   . Abnormal Pap smear   . Anemia   . Fibroids   . Fibromyalgia     degenerative disc disease  . Depression   . Sleep apnea   . Shortness of breath   . On home oxygen therapy     2 liters [place]  . PONV (postoperative nausea and vomiting)   . Low iron     pt states low iron-for Feraheme today in [facility]U  . Morbid obesity with BMI of 45.0-49.9, adult   . Iron deficiency anemia 04/24/2012  . Microcytic anemia 11/04/2013  . Unspecified deficiency anemia 11/04/2013  . Alpha thalassemia   . Asthma     recent admission with exacerbation of asthma 09/15/11   Past Surgical History  Procedure Laterality Date  . Wisdom tooth extraction    . Mandible surgery  2008  . Carpal tunnel release    . Cesarean section      1994, 1996   . Cesarean section  11/28/2011    Procedure: CESAREAN SECTION;  Surgeon: Woodroe Mode, MD;  Location: [facility];  Service: Obstetrics;  Laterality: N/A;   Family History  Problem Relation Age of Onset  . Sickle cell trait Maternal Aunt   . Sickle cell anemia Other   . Schizophrenia Son   .  Depression Daughter    History  Substance Use Topics  . Smoking status: Never Smoker   . Smokeless tobacco: Never Used     Comment: Pt was exposed to 2nd hand smoke at work years ago.  . Alcohol Use: No   OB History    Gravida Para Term Preterm AB TAB SAB Ectopic Multiple Living   3 3 3       3      Review of Systems  10 systems reviewed and found to be negative, except as noted in the HPI.   Allergies  Latex and Morphine and related  Home Medications   Prior to Admission medications   Medication Sig Start Date End Date Taking? Authorizing Provider  albuterol (PROVENTIL HFA;VENTOLIN HFA) 108 (90 BASE) MCG/ACT inhaler Inhale 2 puffs into the lungs every 6 (six) hours as needed for wheezing or shortness of breath. 07/29/13   Tammy S Parrett, NP  amLODipine (NORVASC) 10 MG tablet Take 10 mg by mouth daily.    Historical Provider, MD  ARIPiprazole (ABILIFY) 2 MG tablet Take 1 tablet (2 mg total) by mouth daily. 05/10/14   Merian Capron, MD  cephALEXin (KEFLEX) 500 MG capsule Take 1 capsule (500 mg total) by mouth 2 (two) times daily. 08/02/14   Miyako Oelke, PA-C  clonazePAM (  KLONOPIN) 0.5 MG tablet Take 1 tablet (0.5 mg total) by mouth 2 (two) times daily as needed for anxiety. 05/10/14   Merian Capron, MD  cyclobenzaprine (FLEXERIL) 10 MG tablet Take 10 mg by mouth 3 (three) times daily as needed. For muscle spasms    Historical Provider, MD  Diclofenac Potassium 50 MG PACK Take 50 mg by mouth as needed. 01/07/14   Marcial Pacas, MD  ferumoxytol [facility]) 510 MG/17ML SOLN injection Inject 510 mg into the vein every 3 (three) months.    Historical Provider, MD  FLUoxetine (PROZAC) 20 MG tablet Take 1 tablet (20 mg total) by mouth daily. 05/10/14   Merian Capron, MD  fluticasone (FLONASE) 50 MCG/ACT nasal spray PLACE 2 SPRAYS INTO BOTH NOSTRILS 2 (TWO) TIMES DAILY. 06/30/14   Tammy S Parrett, NP  furosemide (LASIX) 40 MG tablet Take 1 tablet (40 mg total) by mouth daily. 10/25/11 07/30/14   Donnamae Jude, MD  ibuprofen (ADVIL,MOTRIN) 800 MG tablet Take 800 mg by mouth 3 (three) times daily. 02/21/13   Kaitlyn Szekalski, PA-C  ipratropium-albuterol (DUONEB) 0.5-2.5 (3) MG/3ML SOLN TAKE 3 MLS BY NEBULIZATION EVERY 4 (FOUR) HOURS AS NEEDED (FOR WHEEZING). 04/23/14   Tammy S Parrett, NP  Lactulose 20 GM/30ML SOLN Take 15-30 mLs by mouth daily.    Historical Provider, MD  loratadine (CLARITIN) 10 MG tablet Take 1 tablet (10 mg total) by mouth daily. 08/26/13 06/27/16  Chesley Mires, MD  metoprolol tartrate (LOPRESSOR) 25 MG tablet Take 1 tablet (25 mg total) by mouth 2 (two) times daily. 09/28/11 07/30/14  Emily Filbert, MD  montelukast (SINGULAIR) 10 MG tablet TAKE 1 TABLET (10 MG TOTAL) BY MOUTH AT BEDTIME. 03/25/14   Chesley Mires, MD  Multiple Vitamins-Minerals (MULTIVITAMIN WITH MINERALS) tablet Take 1 tablet by mouth daily.    Historical Provider, MD  Oxycodone HCl 10 MG TABS Take 10 mg by mouth 3 (three) times daily.    Historical Provider, MD  OXYGEN-HELIUM IN by Other route. Uses 2 Liters of Oxygen Continuously    Historical Provider, MD  pantoprazole (PROTONIX) 40 MG tablet TAKE 1 TABLET (40 MG TOTAL) BY MOUTH DAILY. 04/27/14   Tammy S Parrett, NP  potassium chloride SA (K-DUR,KLOR-CON) 20 MEQ tablet Take 20 mEq by mouth 2 (two) times daily. 10/22/11 07/30/14  Donnamae Jude, MD  pregabalin (LYRICA) 75 MG capsule Take 75 mg by mouth 2 (two) times daily.    Historical Provider, MD  PULMICORT 0.5 MG/2ML nebulizer solution USE 1 VIAL VIA NEBULIZER TWICE DAILY 04/23/14   Tammy S Parrett, NP  rizatriptan (MAXALT) 10 MG tablet Take 1 tablet (10 mg total) by mouth daily as needed. May repeat in 2 hours if needed 01/07/14   Marcial Pacas, MD  topiramate (TOPAMAX) 100 MG tablet bid 01/07/14   Marcial Pacas, MD   BP 155/91 mmHg  Pulse 106  Temp(Src) 98.4 F (36.9 C) (Oral)  Resp 24  Ht 5\' 2"  (1.575 m)  Wt 309 lb (140.161 kg)  BMI 56.50 kg/m2  SpO2 100% Physical Exam  Constitutional: She is oriented to person,  place, and time. She appears well-developed and well-nourished. No distress.  Morbidly obese  HENT:  Head: Normocephalic and atraumatic.  Mouth/Throat: Oropharynx is clear and moist.  Eyes: Conjunctivae and EOM are normal. Pupils are equal, round, and reactive to light.  Neck: Normal range of motion.  Cardiovascular: Normal rate and intact distal pulses.   Pulmonary/Chest: Effort normal and breath sounds normal. No stridor.  Abdominal:  Soft. She exhibits no distension and no mass. There is no tenderness. There is no rebound and no guarding.  Genitourinary:  No CVA tenderness to palpation bilaterally  Musculoskeletal: Normal range of motion.  Neurological: She is alert and oriented to person, place, and time.  Psychiatric: She has a normal mood and affect.  Nursing note and vitals reviewed.   ED Course  Procedures (including critical care time) Labs Review Labs Reviewed  URINALYSIS, ROUTINE W REFLEX MICROSCOPIC - Abnormal; Notable for the following:    Hgb urine dipstick MODERATE (*)    All other components within normal limits  URINE MICROSCOPIC-ADD ON - Abnormal; Notable for the following:    Squamous Epithelial / LPF FEW (*)    Bacteria, UA FEW (*)    All other components within normal limits  URINE CULTURE  PREGNANCY, URINE    Imaging Review No results found.   EKG Interpretation None      MDM   Final diagnoses:  Hematuria    Filed Vitals:   08/02/14 0945  BP: 155/91  Pulse: 106  Temp: 98.4 F (36.9 C)  TempSrc: Oral  Resp: 24  Height: 5\' 2"  (1.575 m)  Weight: 309 lb (140.161 kg)  SpO2: 100%    Medications  cephALEXin (KEFLEX) capsule 500 mg (not administered)  ketorolac (TORADOL) injection 30 mg (30 mg Intramuscular Given 08/02/14 1039)    Mora Appl Jerrell is a pleasant 39 y.o. female presenting with dysuria, urinary frequency and hematuria. No signs of systemic infection. Urinalysis does show moderate amount of hemoglobin but she has negative  nitrites and negative leukocytes and patient to no blood cells. There is only a few bacteria. I have clarified with her that she has no vaginal discharge, I've offered her pelvic exam and she declines. I've explained to the patient that I do not think it is likely this is a urinary tract infection but will treated as such. Advised her she will need to follow with her primary care doctor later in the week and she may need a referral to urology. Patient verbalizes her understanding.  Evaluation does not show pathology that would require ongoing emergent intervention or inpatient treatment. Pt is hemodynamically stable and mentating appropriately. Discussed findings and plan with patient/guardian, who agrees with care plan. All questions answered. Return precautions discussed and outpatient follow up given.   New Prescriptions   CEPHALEXIN (KEFLEX) 500 MG CAPSULE    Take 1 capsule (500 mg total) by mouth 2 (two) times daily.         Monico Blitz, PA-C 08/02/14 [place], MD 08/02/14 1231

## 2014-08-02 NOTE — ED Notes (Signed)
Pt amb to room 1 with quick steady gait in nad. Pt reports hematuria x several days, this am "it was pure blood". Denies any fevers, reports some chills, and low back pain.

## 2014-08-03 ENCOUNTER — Emergency Department (HOSPITAL_BASED_OUTPATIENT_CLINIC_OR_DEPARTMENT_OTHER)
Admission: EM | Admit: 2014-08-03 | Discharge: 2014-08-03 | Disposition: A | Discharge status: Home / Self Care | Payer: Medicare Other | Attending: Emergency Medicine | Admitting: Emergency Medicine

## 2014-08-03 ENCOUNTER — Encounter (HOSPITAL_BASED_OUTPATIENT_CLINIC_OR_DEPARTMENT_OTHER): Payer: Self-pay | Admitting: Emergency Medicine

## 2014-08-03 DIAGNOSIS — Z862 Personal history of diseases of the blood and blood-forming organs and certain disorders involving the immune mechanism: Secondary | ICD-10-CM | POA: Insufficient documentation

## 2014-08-03 DIAGNOSIS — Z7951 Long term (current) use of inhaled steroids: Secondary | ICD-10-CM | POA: Insufficient documentation

## 2014-08-03 DIAGNOSIS — Z79899 Other long term (current) drug therapy: Secondary | ICD-10-CM | POA: Insufficient documentation

## 2014-08-03 DIAGNOSIS — N920 Excessive and frequent menstruation with regular cycle: Secondary | ICD-10-CM | POA: Insufficient documentation

## 2014-08-03 DIAGNOSIS — G43909 Migraine, unspecified, not intractable, without status migrainosus: Secondary | ICD-10-CM | POA: Insufficient documentation

## 2014-08-03 DIAGNOSIS — Z791 Long term (current) use of non-steroidal anti-inflammatories (NSAID): Secondary | ICD-10-CM | POA: Insufficient documentation

## 2014-08-03 DIAGNOSIS — M797 Fibromyalgia: Secondary | ICD-10-CM | POA: Diagnosis not present

## 2014-08-03 DIAGNOSIS — F419 Anxiety disorder, unspecified: Secondary | ICD-10-CM | POA: Diagnosis not present

## 2014-08-03 DIAGNOSIS — N921 Excessive and frequent menstruation with irregular cycle: Secondary | ICD-10-CM

## 2014-08-03 DIAGNOSIS — Z91040 Latex allergy status: Secondary | ICD-10-CM | POA: Diagnosis not present

## 2014-08-03 DIAGNOSIS — I10 Essential (primary) hypertension: Secondary | ICD-10-CM | POA: Diagnosis not present

## 2014-08-03 DIAGNOSIS — Z9981 Dependence on supplemental oxygen: Secondary | ICD-10-CM | POA: Insufficient documentation

## 2014-08-03 DIAGNOSIS — N898 Other specified noninflammatory disorders of vagina: Secondary | ICD-10-CM | POA: Diagnosis present

## 2014-08-03 DIAGNOSIS — J45909 Unspecified asthma, uncomplicated: Secondary | ICD-10-CM | POA: Insufficient documentation

## 2014-08-03 DIAGNOSIS — F329 Major depressive disorder, single episode, unspecified: Secondary | ICD-10-CM | POA: Diagnosis not present

## 2014-08-03 LAB — WET PREP, GENITAL
TRICH WET PREP: NONE SEEN
Yeast Wet Prep HPF POC: NONE SEEN

## 2014-08-03 LAB — URINE CULTURE: Colony Count: 25000

## 2014-08-03 NOTE — Discharge Instructions (Signed)
Abnormal Uterine Bleeding Abnormal uterine bleeding can affect women at various stages in life, including teenagers, women in their reproductive years, pregnant women, and women who have reached menopause. Several kinds of uterine bleeding are considered abnormal, including:  Bleeding or spotting between periods.   Bleeding after sexual intercourse.   Bleeding that is heavier or more than normal.   Periods that last longer than usual.  Bleeding after menopause.  Many cases of abnormal uterine bleeding are minor and simple to treat, while others are more serious. Any type of abnormal bleeding should be evaluated by your health care provider. Treatment will depend on the cause of the bleeding. HOME CARE INSTRUCTIONS Monitor your condition for any changes. The following actions may help to alleviate any discomfort you are experiencing:  Avoid the use of tampons and douches as directed by your health care provider.  Change your pads frequently. You should get regular pelvic exams and Pap tests. Keep all follow-up appointments for diagnostic tests as directed by your health care provider.  SEEK MEDICAL CARE IF:   Your bleeding lasts more than 1 week.   You feel dizzy at times.  SEEK IMMEDIATE MEDICAL CARE IF:   You pass out.   You are changing pads every 15 to 30 minutes.   You have abdominal pain.  You have a fever.   You become sweaty or weak.   You are passing large blood clots from the vagina.   You start to feel nauseous and vomit. MAKE SURE YOU:   Understand these instructions.  Will watch your condition.  Will get help right away if you are not doing well or get worse. Document Released: 04/02/2005 Document Revised: 04/07/2013 Document Reviewed: 10/30/2012 [employer] Patient Information 2015 [employer]. This information is not intended to replace advice given to you by your health care provider. Make sure you discuss any questions you have with your  health care provider.  

## 2014-08-03 NOTE — Telephone Encounter (Signed)
08-03-2014 H.I.M. Nor Triage have received lab results from neurologist.  Called patient, left message requesting status of lab request.  Noted two consecutive ED visits for hematuria and vaginal discharge with no blood work.

## 2014-08-03 NOTE — ED Notes (Signed)
Pt sts that she was told yesterday when she was here that if she got a vaginal discharge to come back.  She has noticed a brown vaginal discharge. Denies itching or burning.

## 2014-08-03 NOTE — ED Notes (Signed)
MD at bedside. 

## 2014-08-03 NOTE — ED Provider Notes (Signed)
CSN: [id number]     Arrival date & time 08/03/14  0230 History   First MD Initiated Contact with Patient 08/03/14 0246     Chief Complaint  Patient presents with  . Vaginal Discharge     (Consider location/radiation/quality/duration/timing/severity/associated sxs/prior Treatment) Patient is a 39 y.o. female presenting with vaginal discharge. The history is provided by the patient.  Vaginal Discharge Quality:  Bloody and brown Severity:  Mild Onset quality:  Gradual Timing:  Constant Progression:  Unchanged Chronicity:  New Context: not after intercourse   Relieved by:  Nothing Worsened by:  Nothing tried Ineffective treatments:  None tried Associated symptoms: no abdominal pain   Risk factors: no endometriosis   Seen earlier for hematuria but no blood in UA now back with scant vaginal bleeding.    Past Medical History  Diagnosis Date  . Hypertension   . Migraine   . Anxiety   . Abnormal Pap smear   . Anemia   . Fibroids   . Fibromyalgia     degenerative disc disease  . Depression   . Sleep apnea   . Shortness of breath   . On home oxygen therapy     2 liters [place]  . PONV (postoperative nausea and vomiting)   . Low iron     pt states low iron-for Feraheme today in [facility]U  . Morbid obesity with BMI of 45.0-49.9, adult   . Iron deficiency anemia 04/24/2012  . Microcytic anemia 11/04/2013  . Unspecified deficiency anemia 11/04/2013  . Alpha thalassemia   . Asthma     recent admission with exacerbation of asthma 09/15/11   Past Surgical History  Procedure Laterality Date  . Wisdom tooth extraction    . Mandible surgery  2008  . Carpal tunnel release    . Cesarean section      1994, 1996   . Cesarean section  11/28/2011    Procedure: CESAREAN SECTION;  Surgeon: Woodroe Mode, MD;  Location: [facility];  Service: Obstetrics;  Laterality: N/A;   Family History  Problem Relation Age of Onset  . Sickle cell trait Maternal Aunt   . Sickle cell anemia Other   . Schizophrenia  Son   . Depression Daughter    History  Substance Use Topics  . Smoking status: Never Smoker   . Smokeless tobacco: Never Used     Comment: Pt was exposed to 2nd hand smoke at work years ago.  . Alcohol Use: No   OB History    Gravida Para Term Preterm AB TAB SAB Ectopic Multiple Living   3 3 3       3      Review of Systems  Gastrointestinal: Negative for abdominal pain.  Genitourinary: Positive for vaginal bleeding and vaginal discharge.  All other systems reviewed and are negative.     Allergies  Latex and Morphine and related  Home Medications   Prior to Admission medications   Medication Sig Start Date End Date Taking? Authorizing Provider  albuterol (PROVENTIL HFA;VENTOLIN HFA) 108 (90 BASE) MCG/ACT inhaler Inhale 2 puffs into the lungs every 6 (six) hours as needed for wheezing or shortness of breath. 07/29/13   Tammy S Parrett, NP  amLODipine (NORVASC) 10 MG tablet Take 10 mg by mouth daily.    Historical Provider, MD  ARIPiprazole (ABILIFY) 2 MG tablet Take 1 tablet (2 mg total) by mouth daily. 05/10/14   Merian Capron, MD  cephALEXin (KEFLEX) 500 MG capsule Take 1 capsule (500 mg total)  by mouth 2 (two) times daily. 08/02/14   Nicole Pisciotta, PA-C  clonazePAM (KLONOPIN) 0.5 MG tablet Take 1 tablet (0.5 mg total) by mouth 2 (two) times daily as needed for anxiety. 05/10/14   Merian Capron, MD  cyclobenzaprine (FLEXERIL) 10 MG tablet Take 10 mg by mouth 3 (three) times daily as needed. For muscle spasms    Historical Provider, MD  Diclofenac Potassium 50 MG PACK Take 50 mg by mouth as needed. 01/07/14   Marcial Pacas, MD  ferumoxytol [facility]) 510 MG/17ML SOLN injection Inject 510 mg into the vein every 3 (three) months.    Historical Provider, MD  FLUoxetine (PROZAC) 20 MG tablet Take 1 tablet (20 mg total) by mouth daily. 05/10/14   Merian Capron, MD  fluticasone (FLONASE) 50 MCG/ACT nasal spray PLACE 2 SPRAYS INTO BOTH NOSTRILS 2 (TWO) TIMES DAILY. 06/30/14   Tammy S Parrett,  NP  furosemide (LASIX) 40 MG tablet Take 1 tablet (40 mg total) by mouth daily. 10/25/11 07/30/14  Donnamae Jude, MD  ibuprofen (ADVIL,MOTRIN) 800 MG tablet Take 800 mg by mouth 3 (three) times daily. 02/21/13   Kaitlyn Szekalski, PA-C  ipratropium-albuterol (DUONEB) 0.5-2.5 (3) MG/3ML SOLN TAKE 3 MLS BY NEBULIZATION EVERY 4 (FOUR) HOURS AS NEEDED (FOR WHEEZING). 04/23/14   Tammy S Parrett, NP  Lactulose 20 GM/30ML SOLN Take 15-30 mLs by mouth daily.    Historical Provider, MD  loratadine (CLARITIN) 10 MG tablet Take 1 tablet (10 mg total) by mouth daily. 08/26/13 06/27/16  Chesley Mires, MD  metoprolol tartrate (LOPRESSOR) 25 MG tablet Take 1 tablet (25 mg total) by mouth 2 (two) times daily. 09/28/11 07/30/14  Emily Filbert, MD  montelukast (SINGULAIR) 10 MG tablet TAKE 1 TABLET (10 MG TOTAL) BY MOUTH AT BEDTIME. 03/25/14   Chesley Mires, MD  Multiple Vitamins-Minerals (MULTIVITAMIN WITH MINERALS) tablet Take 1 tablet by mouth daily.    Historical Provider, MD  Oxycodone HCl 10 MG TABS Take 10 mg by mouth 3 (three) times daily.    Historical Provider, MD  OXYGEN-HELIUM IN by Other route. Uses 2 Liters of Oxygen Continuously    Historical Provider, MD  pantoprazole (PROTONIX) 40 MG tablet TAKE 1 TABLET (40 MG TOTAL) BY MOUTH DAILY. 04/27/14   Tammy S Parrett, NP  potassium chloride SA (K-DUR,KLOR-CON) 20 MEQ tablet Take 20 mEq by mouth 2 (two) times daily. 10/22/11 07/30/14  Donnamae Jude, MD  pregabalin (LYRICA) 75 MG capsule Take 75 mg by mouth 2 (two) times daily.    Historical Provider, MD  PULMICORT 0.5 MG/2ML nebulizer solution USE 1 VIAL VIA NEBULIZER TWICE DAILY 04/23/14   Tammy S Parrett, NP  rizatriptan (MAXALT) 10 MG tablet Take 1 tablet (10 mg total) by mouth daily as needed. May repeat in 2 hours if needed 01/07/14   Marcial Pacas, MD  topiramate (TOPAMAX) 100 MG tablet bid 01/07/14   Marcial Pacas, MD   BP 153/86 mmHg  Pulse 106  Temp(Src) 99 F (37.2 C) (Oral)  Resp 22  Ht 5\' 2"  (1.575 m)  Wt 309 lb  (140.161 kg)  BMI 56.50 kg/m2  SpO2 100% Physical Exam  Constitutional: She is oriented to person, place, and time. She appears well-developed and well-nourished. No distress.  HENT:  Head: Normocephalic and atraumatic.  Mouth/Throat: Oropharynx is clear and moist.  Eyes: Conjunctivae are normal. Pupils are equal, round, and reactive to light.  Neck: Normal range of motion. Neck supple.  Cardiovascular: Normal rate, regular rhythm and intact distal pulses.  Pulmonary/Chest: Effort normal and breath sounds normal. No respiratory distress. She has no wheezes. She has no rales.  Abdominal: Soft. Bowel sounds are normal. There is no tenderness. There is no rebound and no guarding.  Genitourinary:  Scant blood per cervical os.  No CMT chaperone present  Musculoskeletal: Normal range of motion.  Neurological: She is alert and oriented to person, place, and time.  Skin: Skin is warm and dry.  Psychiatric: She has a normal mood and affect.    ED Course  Procedures (including critical care time) Labs Review Labs Reviewed  WET PREP, GENITAL - Abnormal; Notable for the following:    Clue Cells Wet Prep HPF POC FEW (*)    WBC, Wet Prep HPF POC FEW (*)    All other components within normal limits  GC/CHLAMYDIA PROBE AMP ([place])    Imaging Review No results found.   EKG Interpretation None      MDM   Final diagnoses:  Menorrhagia with irregular cycle    Follow up with your GYN in am to discuss breakthrough bleeding on emplanon.      Veatrice Kells, MD 08/03/14 [phone]

## 2014-08-04 LAB — GC/CHLAMYDIA PROBE AMP (~~LOC~~) NOT AT ARMC
Chlamydia: NEGATIVE
Neisseria Gonorrhea: NEGATIVE

## 2014-08-06 ENCOUNTER — Telehealth (HOSPITAL_COMMUNITY): Payer: Self-pay | Admitting: *Deleted

## 2014-08-06 MED ORDER — CLONAZEPAM 0.5 MG PO TABS: 0.5000 mg | ORAL_TABLET | Freq: Two times a day (BID) | ORAL | Status: DC | PRN

## 2014-08-06 MED ORDER — FLUOXETINE HCL 20 MG PO TABS: 20.0000 mg | ORAL_TABLET | Freq: Every day | ORAL | Status: DC

## 2014-08-06 MED ORDER — ARIPIPRAZOLE 2 MG PO TABS: 2.0000 mg | ORAL_TABLET | Freq: Every day | ORAL | Status: DC

## 2014-08-06 NOTE — Telephone Encounter (Signed)
Pt called for a refill for Klonopin 0.5mg . Per Dr. De Nurse, pt is authorized for a refill for Klonopin 0.5mg , Qty 28. Pt was informed the refill is a 14 day supply.  Per Ebony Hail, pharmacy will notify pt of prescription status. Prescription was phoned into [facility] ([place]  Pt has a f/u appt on 5/5. Pt states and shows understanding.

## 2014-08-06 NOTE — Telephone Encounter (Signed)
Pt called for a medication refill for Abilify 2mg , Klonopin 0.5mg , Prozac 20mg . Per Dr. De Nurse, pt is authorized for a refill for Abilify 2mg , Qty 14 and Prozac 20mg , Qty 14. Pt will need to pick up prescription for Klonopin 0.5mg . Prescriptions for Abilify and Prozac were sent to pharmacy. Called and informed pt of prescription status. Pt states and shows understanding.  Pt has a f/u appt on 5/5.

## 2014-08-19 ENCOUNTER — Ambulatory Visit (HOSPITAL_COMMUNITY): Payer: Self-pay | Admitting: Psychiatry

## 2014-08-23 ENCOUNTER — Ambulatory Visit (INDEPENDENT_AMBULATORY_CARE_PROVIDER_SITE_OTHER): Payer: Medicare Other | Admitting: Psychiatry

## 2014-08-23 VITALS — Ht 62.0 in | Wt 309.0 lb

## 2014-08-23 DIAGNOSIS — F0630 Mood disorder due to known physiological condition, unspecified: Secondary | ICD-10-CM | POA: Diagnosis not present

## 2014-08-23 DIAGNOSIS — F410 Panic disorder [episodic paroxysmal anxiety] without agoraphobia: Secondary | ICD-10-CM | POA: Diagnosis not present

## 2014-08-23 DIAGNOSIS — F331 Major depressive disorder, recurrent, moderate: Secondary | ICD-10-CM

## 2014-08-23 MED ORDER — CLONAZEPAM 0.5 MG PO TABS: 0.5000 mg | ORAL_TABLET | Freq: Two times a day (BID) | ORAL | Status: DC | PRN

## 2014-08-23 MED ORDER — ARIPIPRAZOLE 2 MG PO TABS: 2.0000 mg | ORAL_TABLET | Freq: Every day | ORAL | Status: DC

## 2014-08-23 MED ORDER — CLONAZEPAM 0.5 MG PO TABS
0.5000 mg | ORAL_TABLET | Freq: Two times a day (BID) | ORAL | Status: DC | PRN
Start: 2014-08-23 — End: 2014-08-23

## 2014-08-23 MED ORDER — FLUOXETINE HCL 20 MG PO TABS: ORAL_TABLET | ORAL | Status: DC

## 2014-08-23 MED ORDER — FLUOXETINE HCL 20 MG PO TABS: 20.0000 mg | ORAL_TABLET | Freq: Every day | ORAL | Status: DC
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
## 2014-08-24 ENCOUNTER — Encounter (HOSPITAL_COMMUNITY): Payer: Self-pay | Admitting: Psychiatry

## 2014-08-30 ENCOUNTER — Other Ambulatory Visit (HOSPITAL_COMMUNITY): Payer: Self-pay | Admitting: Psychiatry

## 2014-08-31 NOTE — Telephone Encounter (Signed)
Pt received a written prescription for Klonopin 0.5mg  on 5/9.

## 2014-09-09 ENCOUNTER — Ambulatory Visit (INDEPENDENT_AMBULATORY_CARE_PROVIDER_SITE_OTHER): Payer: Medicare Other | Admitting: Medical

## 2014-09-09 ENCOUNTER — Encounter: Payer: Self-pay | Admitting: Medical

## 2014-09-09 VITALS — BP 145/76 | HR 102 | Temp 99.1°F | Ht 62.0 in | Wt 308.9 lb

## 2014-09-09 DIAGNOSIS — Z3046 Encounter for surveillance of implantable subdermal contraceptive: Secondary | ICD-10-CM

## 2014-09-09 DIAGNOSIS — Z3049 Encounter for surveillance of other contraceptives: Secondary | ICD-10-CM

## 2014-09-09 NOTE — Progress Notes (Signed)
Patient ID: Cheyenne Arias, female   DOB: Oct 17, 1975, 39 y.o.   MRN: [id number]  GYNECOLOGY CLINIC PROCEDURE NOTE  Ms. NANNETTE ZILL is a 39 y.o. G3P3003 here for Nexplanon removal. No GYN concerns. No other gynecologic concerns.  Nexplanon Removal Patient was given informed consent for removal of her Nexplanon.  Appropriate time out taken. Nexplanon site identified.  Area prepped in usual sterile fashon. One ml of 1% lidocaine was used to anesthetize the area at the distal end of the implant. A small stab incision was made right beside the implant on the distal portion.  The Nexplanon rod was grasped using hemostats and removed without difficulty.  There was minimal blood loss. There were no complications.  A small amount of antibiotic ointment and steri-strips were applied over the small incision.  A pressure bandage was applied to reduce any bruising.  The patient tolerated the procedure well and was given post procedure instructions.  Patient is planning to use condoms for contraception while deciding on what other type of birth control she would like to initiate. All options were discussed. Patient is considering Mirena IUD. Information about IUD given in AVS. Patient will call to make an appointment for birth control initiation when she has decided what kind of birth control she would like to use. Advised strict condom use in the interim.    Luvenia Redden, PA-C 09/09/2014 2:17 PM

## 2014-09-09 NOTE — Patient Instructions (Signed)
Levonorgestrel intrauterine device (IUD) What is this medicine? LEVONORGESTREL IUD (LEE voe nor jes trel) is a contraceptive (birth control) device. The device is placed inside the uterus by a healthcare professional. It is used to prevent pregnancy and can also be used to treat heavy bleeding that occurs during your period. Depending on the device, it can be used for 3 to 5 years. This medicine may be used for other purposes; ask your health care provider or pharmacist if you have questions. COMMON BRAND NAME(S): LILETTA, Mirena, Skyla What should I tell my health care provider before I take this medicine? They need to know if you have any of these conditions: -abnormal Pap smear -cancer of the breast, uterus, or cervix -diabetes -endometritis -genital or pelvic infection now or in the past -have more than one sexual partner or your partner has more than one partner -heart disease -history of an ectopic or tubal pregnancy -immune system problems -IUD in place -liver disease or tumor -problems with blood clots or take blood-thinners -use intravenous drugs -uterus of unusual shape -vaginal bleeding that has not been explained -an unusual or allergic reaction to levonorgestrel, other hormones, silicone, or polyethylene, medicines, foods, dyes, or preservatives -pregnant or trying to get pregnant -breast-feeding How should I use this medicine? This device is placed inside the uterus by a health care professional. Talk to your pediatrician regarding the use of this medicine in children. Special care may be needed. Overdosage: If you think you have taken too much of this medicine contact a poison control center or emergency room at once. NOTE: This medicine is only for you. Do not share this medicine with others. What if I miss a dose? This does not apply. What may interact with this medicine? Do not take this medicine with any of the following  medications: -amprenavir -bosentan -fosamprenavir This medicine may also interact with the following medications: -aprepitant -barbiturate medicines for inducing sleep or treating seizures -bexarotene -griseofulvin -medicines to treat seizures like carbamazepine, ethotoin, felbamate, oxcarbazepine, phenytoin, topiramate -modafinil -pioglitazone -rifabutin -rifampin -rifapentine -some medicines to treat HIV infection like atazanavir, indinavir, lopinavir, nelfinavir, tipranavir, ritonavir -St. John's wort -warfarin This list may not describe all possible interactions. Give your health care provider a list of all the medicines, herbs, non-prescription drugs, or dietary supplements you use. Also tell them if you smoke, drink alcohol, or use illegal drugs. Some items may interact with your medicine. What should I watch for while using this medicine? Visit your doctor or health care professional for regular check ups. See your doctor if you or your partner has sexual contact with others, becomes HIV positive, or gets a sexual transmitted disease. This product does not protect you against HIV infection (AIDS) or other sexually transmitted diseases. You can check the placement of the IUD yourself by reaching up to the top of your vagina with clean fingers to feel the threads. Do not pull on the threads. It is a good habit to check placement after each menstrual period. Call your doctor right away if you feel more of the IUD than just the threads or if you cannot feel the threads at all. The IUD may come out by itself. You may become pregnant if the device comes out. If you notice that the IUD has come out use a backup birth control method like condoms and call your health care provider. Using tampons will not change the position of the IUD and are okay to use during your period. What side effects may   I notice from receiving this medicine? Side effects that you should report to your doctor or  health care professional as soon as possible: -allergic reactions like skin rash, itching or hives, swelling of the face, lips, or tongue -fever, flu-like symptoms -genital sores -high blood pressure -no menstrual period for 6 weeks during use -pain, swelling, warmth in the leg -pelvic pain or tenderness -severe or sudden headache -signs of pregnancy -stomach cramping -sudden shortness of breath -trouble with balance, talking, or walking -unusual vaginal bleeding, discharge -yellowing of the eyes or skin Side effects that usually do not require medical attention (report to your doctor or health care professional if they continue or are bothersome): -acne -breast pain -change in sex drive or performance -changes in weight -cramping, dizziness, or faintness while the device is being inserted -headache -irregular menstrual bleeding within first 3 to 6 months of use -nausea This list may not describe all possible side effects. Call your doctor for medical advice about side effects. You may report side effects to FDA at 1-800-FDA-1088. Where should I keep my medicine? This does not apply. NOTE: This sheet is a summary. It may not cover all possible information. If you have questions about this medicine, talk to your doctor, pharmacist, or health care provider.  2015, Elsevier/Gold Standard. (2011-05-03 13:54:04)  

## 2014-09-15 ENCOUNTER — Other Ambulatory Visit: Payer: Self-pay | Admitting: Pulmonary Disease

## 2014-09-15 ENCOUNTER — Encounter: Payer: Self-pay | Admitting: Adult Health

## 2014-09-16 NOTE — Telephone Encounter (Signed)
Claritin refilled.

## 2014-09-17 ENCOUNTER — Other Ambulatory Visit: Payer: Self-pay | Admitting: Adult Health

## 2014-09-17 ENCOUNTER — Other Ambulatory Visit: Payer: Self-pay | Admitting: Pulmonary Disease

## 2014-09-28 ENCOUNTER — Encounter: Payer: Self-pay | Admitting: General Practice

## 2014-10-22 ENCOUNTER — Telehealth: Payer: Self-pay | Admitting: Adult Health

## 2014-10-22 NOTE — Telephone Encounter (Addendum)
LVM for patient to return call to review CPAP Compliance  Per TP: Average 4 hours nightly Good compliance and control Try to up hours each night, would like for it to be over 4 hours nightly OV with VS in 2-3 months

## 2014-10-25 ENCOUNTER — Ambulatory Visit (HOSPITAL_COMMUNITY): Payer: Self-pay | Admitting: Psychiatry

## 2014-10-25 NOTE — Telephone Encounter (Signed)
LVM for pt to return call

## 2014-10-26 NOTE — Telephone Encounter (Signed)
LVM for patient to return call to review results and recs.

## 2014-10-27 NOTE — Telephone Encounter (Signed)
LVM for patient to return call. 

## 2014-10-29 NOTE — Telephone Encounter (Signed)
I spoke with patient about results and she verbalized understanding and had no questions 

## 2014-11-02 ENCOUNTER — Encounter (HOSPITAL_COMMUNITY): Payer: Self-pay | Admitting: Emergency Medicine

## 2014-11-02 DIAGNOSIS — M79672 Pain in left foot: Secondary | ICD-10-CM | POA: Insufficient documentation

## 2014-11-02 DIAGNOSIS — I10 Essential (primary) hypertension: Secondary | ICD-10-CM | POA: Insufficient documentation

## 2014-11-02 DIAGNOSIS — M79671 Pain in right foot: Secondary | ICD-10-CM | POA: Diagnosis not present

## 2014-11-02 DIAGNOSIS — M79661 Pain in right lower leg: Secondary | ICD-10-CM | POA: Insufficient documentation

## 2014-11-02 DIAGNOSIS — M79602 Pain in left arm: Secondary | ICD-10-CM | POA: Insufficient documentation

## 2014-11-02 DIAGNOSIS — J45909 Unspecified asthma, uncomplicated: Secondary | ICD-10-CM | POA: Diagnosis not present

## 2014-11-02 DIAGNOSIS — M79662 Pain in left lower leg: Secondary | ICD-10-CM | POA: Diagnosis not present

## 2014-11-02 NOTE — ED Notes (Signed)
Pt, reports bilateral lower leg pain /feet pain and left arm pain for 3 weeks , denies injury or fall .

## 2014-11-03 ENCOUNTER — Emergency Department (HOSPITAL_COMMUNITY)
Admission: EM | Admit: 2014-11-03 | Discharge: 2014-11-03 | Discharge status: Against Medical Advice | Payer: Medicare Other | Attending: Emergency Medicine | Admitting: Emergency Medicine

## 2014-11-03 ENCOUNTER — Other Ambulatory Visit (HOSPITAL_COMMUNITY): Payer: Self-pay | Admitting: Psychiatry

## 2014-11-03 NOTE — ED Notes (Signed)
Pt is leaving.  Did not tell staff, but seen her leave in four door red car.

## 2014-11-05 ENCOUNTER — Other Ambulatory Visit (HOSPITAL_COMMUNITY): Payer: Self-pay | Admitting: Psychiatry

## 2014-11-11 NOTE — Telephone Encounter (Signed)
Received fax [facility] for refills for Prozac 20mg , Abilify 2mg , and Klonopin 0.5mg . Per Dr. De Nurse, pt request is denied. PT will to need to schedule appt with office.

## 2014-11-12 ENCOUNTER — Emergency Department (HOSPITAL_BASED_OUTPATIENT_CLINIC_OR_DEPARTMENT_OTHER)
Admission: EM | Admit: 2014-11-12 | Discharge: 2014-11-13 | Disposition: A | Discharge status: Home / Self Care | Payer: Medicare Other | Attending: Emergency Medicine | Admitting: Emergency Medicine

## 2014-11-12 ENCOUNTER — Emergency Department (HOSPITAL_BASED_OUTPATIENT_CLINIC_OR_DEPARTMENT_OTHER): Payer: Medicare Other

## 2014-11-12 ENCOUNTER — Encounter (HOSPITAL_BASED_OUTPATIENT_CLINIC_OR_DEPARTMENT_OTHER): Payer: Self-pay | Admitting: *Deleted

## 2014-11-12 DIAGNOSIS — Z862 Personal history of diseases of the blood and blood-forming organs and certain disorders involving the immune mechanism: Secondary | ICD-10-CM | POA: Insufficient documentation

## 2014-11-12 DIAGNOSIS — R1031 Right lower quadrant pain: Secondary | ICD-10-CM | POA: Diagnosis not present

## 2014-11-12 DIAGNOSIS — R1030 Lower abdominal pain, unspecified: Secondary | ICD-10-CM | POA: Diagnosis present

## 2014-11-12 DIAGNOSIS — Z7951 Long term (current) use of inhaled steroids: Secondary | ICD-10-CM | POA: Insufficient documentation

## 2014-11-12 DIAGNOSIS — Z86018 Personal history of other benign neoplasm: Secondary | ICD-10-CM | POA: Diagnosis not present

## 2014-11-12 DIAGNOSIS — J45909 Unspecified asthma, uncomplicated: Secondary | ICD-10-CM | POA: Diagnosis not present

## 2014-11-12 DIAGNOSIS — F329 Major depressive disorder, single episode, unspecified: Secondary | ICD-10-CM | POA: Insufficient documentation

## 2014-11-12 DIAGNOSIS — Z791 Long term (current) use of non-steroidal anti-inflammatories (NSAID): Secondary | ICD-10-CM | POA: Insufficient documentation

## 2014-11-12 DIAGNOSIS — Z3202 Encounter for pregnancy test, result negative: Secondary | ICD-10-CM | POA: Diagnosis not present

## 2014-11-12 DIAGNOSIS — Z79899 Other long term (current) drug therapy: Secondary | ICD-10-CM | POA: Insufficient documentation

## 2014-11-12 DIAGNOSIS — F419 Anxiety disorder, unspecified: Secondary | ICD-10-CM | POA: Diagnosis not present

## 2014-11-12 DIAGNOSIS — M797 Fibromyalgia: Secondary | ICD-10-CM | POA: Diagnosis not present

## 2014-11-12 DIAGNOSIS — Z91040 Latex allergy status: Secondary | ICD-10-CM | POA: Insufficient documentation

## 2014-11-12 DIAGNOSIS — G43909 Migraine, unspecified, not intractable, without status migrainosus: Secondary | ICD-10-CM | POA: Insufficient documentation

## 2014-11-12 DIAGNOSIS — I10 Essential (primary) hypertension: Secondary | ICD-10-CM | POA: Insufficient documentation

## 2014-11-12 DIAGNOSIS — Z9981 Dependence on supplemental oxygen: Secondary | ICD-10-CM | POA: Insufficient documentation

## 2014-11-12 DIAGNOSIS — G4730 Sleep apnea, unspecified: Secondary | ICD-10-CM | POA: Insufficient documentation

## 2014-11-12 LAB — URINALYSIS, ROUTINE W REFLEX MICROSCOPIC
Bilirubin Urine: NEGATIVE
GLUCOSE, UA: NEGATIVE mg/dL
HGB URINE DIPSTICK: NEGATIVE
KETONES UR: NEGATIVE mg/dL
Nitrite: NEGATIVE
PH: 7 (ref 5.0–8.0)
Protein, ur: NEGATIVE mg/dL
Specific Gravity, Urine: 1.007 (ref 1.005–1.030)
Urobilinogen, UA: 1 mg/dL (ref 0.0–1.0)

## 2014-11-12 LAB — URINE MICROSCOPIC-ADD ON

## 2014-11-12 LAB — CBC WITH DIFFERENTIAL/PLATELET
Basophils Absolute: 0 10*3/uL (ref 0.0–0.1)
Basophils Relative: 0 % (ref 0–1)
Eosinophils Absolute: 0.3 10*3/uL (ref 0.0–0.7)
Eosinophils Relative: 3 % (ref 0–5)
HEMATOCRIT: 36.5 % (ref 36.0–46.0)
Hemoglobin: 11.4 g/dL — ABNORMAL LOW (ref 12.0–15.0)
LYMPHS ABS: 2.3 10*3/uL (ref 0.7–4.0)
Lymphocytes Relative: 21 % (ref 12–46)
MCH: 24.4 pg — ABNORMAL LOW (ref 26.0–34.0)
MCHC: 31.2 g/dL (ref 30.0–36.0)
MCV: 78 fL (ref 78.0–100.0)
MONO ABS: 0.7 10*3/uL (ref 0.1–1.0)
MONOS PCT: 7 % (ref 3–12)
Neutro Abs: 7.7 10*3/uL (ref 1.7–7.7)
Neutrophils Relative %: 69 % (ref 43–77)
PLATELETS: 359 10*3/uL (ref 150–400)
RBC: 4.68 MIL/uL (ref 3.87–5.11)
RDW: 17.2 % — ABNORMAL HIGH (ref 11.5–15.5)
WBC: 11.1 10*3/uL — ABNORMAL HIGH (ref 4.0–10.5)

## 2014-11-12 LAB — BASIC METABOLIC PANEL
Anion gap: 11 (ref 5–15)
BUN: 12 mg/dL (ref 6–20)
CHLORIDE: 104 mmol/L (ref 101–111)
CO2: 23 mmol/L (ref 22–32)
CREATININE: 0.83 mg/dL (ref 0.44–1.00)
Calcium: 9.3 mg/dL (ref 8.9–10.3)
GFR calc Af Amer: >60 mL/min (ref >60)
GFR calc non Af Amer: >60 mL/min (ref >60)
Glucose, Bld: 105 mg/dL — ABNORMAL HIGH (ref 65–99)
POTASSIUM: 3 mmol/L — AB (ref 3.5–5.1)
Sodium: 138 mmol/L (ref 135–145)

## 2014-11-12 LAB — WET PREP, GENITAL
Trich, Wet Prep: NONE SEEN
YEAST WET PREP: NONE SEEN

## 2014-11-12 LAB — PREGNANCY, URINE: PREG TEST UR: NEGATIVE

## 2014-11-12 MED ORDER — IOHEXOL 300 MG/ML  SOLN
50.0000 mL | Freq: Once | INTRAMUSCULAR | Status: AC | PRN
  Administered 2014-11-12: 50 mL via ORAL

## 2014-11-12 NOTE — ED Provider Notes (Signed)
CSN: [id number]     Arrival date & time 11/12/14  2042 History   First MD Initiated Contact with Patient 11/12/14 2223     Chief Complaint  Patient presents with  . Abdominal Pain     (Consider location/radiation/quality/duration/timing/severity/associated sxs/prior Treatment) HPI Patient reports lower abdominal pain for about 3 days. She indicates her lower suprapubic region as I have her localize and show me where the pain is. It's been aching and cramping in quality. She denies sexual activity she denies vaginal discharge or abnormal bleeding. She reports some nausea but no vomiting or diarrhea. She denies pain burning or urgency with urination. Past Medical History  Diagnosis Date  . Hypertension   . Migraine   . Anxiety   . Abnormal Pap smear   . Anemia   . Fibroids   . Fibromyalgia     degenerative disc disease  . Depression   . Sleep apnea   . Shortness of breath   . On home oxygen therapy     2 liters [place]  . PONV (postoperative nausea and vomiting)   . Low iron     pt states low iron-for Feraheme today in [facility]  . Morbid obesity with BMI of 45.0-49.9, adult   . Iron deficiency anemia 04/24/2012  . Microcytic anemia 11/04/2013  . Unspecified deficiency anemia 11/04/2013  . Alpha thalassemia   . Asthma     recent admission with exacerbation of asthma 09/15/11   Past Surgical History  Procedure Laterality Date  . Wisdom tooth extraction    . Mandible surgery  2008  . Carpal tunnel release    . Cesarean section      1994, 1996   . Cesarean section  11/28/2011    Procedure: CESAREAN SECTION;  Surgeon: Woodroe Mode, MD;  Location: [facility];  Service: Obstetrics;  Laterality: N/A;   Family History  Problem Relation Age of Onset  . Sickle cell trait Maternal Aunt   . Sickle cell anemia Other   . Schizophrenia Son   . Depression Daughter    History  Substance Use Topics  . Smoking status: Never Smoker   . Smokeless tobacco: Never Used     Comment: Pt was exposed to 2nd  hand smoke at work years ago.  . Alcohol Use: No   OB History    Gravida Para Term Preterm AB TAB SAB Ectopic Multiple Living   3 3 3       3      Review of Systems  10 Systems reviewed and are negative for acute change except as noted in the HPI.   Allergies  Latex and Morphine and related  Home Medications   Prior to Admission medications   Medication Sig Start Date End Date Taking? Authorizing Provider  amLODipine (NORVASC) 10 MG tablet Take 10 mg by mouth daily.    Historical Provider, MD  ARIPiprazole (ABILIFY) 2 MG tablet Take 1 tablet (2 mg total) by mouth daily. 08/23/14   Merian Capron, MD  clonazePAM (KLONOPIN) 0.5 MG tablet Take 1 tablet (0.5 mg total) by mouth 2 (two) times daily as needed for anxiety. 08/23/14   Merian Capron, MD  cyclobenzaprine (FLEXERIL) 10 MG tablet Take 10 mg by mouth 3 (three) times daily as needed. For muscle spasms    Historical Provider, MD  ferumoxytol (FERAHEME) 510 MG/17ML SOLN injection Inject 510 mg into the vein every 3 (three) months.    Historical Provider, MD  FLUoxetine (PROZAC) 20 MG tablet Please give  Capsules of prozac instead of tablets. She tolerates capsules better 08/23/14   Merian Capron, MD  fluticasone (FLONASE) 50 MCG/ACT nasal spray PLACE 2 SPRAYS INTO BOTH NOSTRILS 2 (TWO) TIMES DAILY. 06/30/14   Tammy S Parrett, NP  furosemide (LASIX) 40 MG tablet Take 1 tablet (40 mg total) by mouth daily. 10/25/11 07/30/14  Donnamae Jude, MD  ibuprofen (ADVIL,MOTRIN) 800 MG tablet Take 800 mg by mouth 3 (three) times daily. 02/21/13   Kaitlyn Szekalski, PA-C  ibuprofen (ADVIL,MOTRIN) 800 MG tablet Take 1 tablet (800 mg total) by mouth 3 (three) times daily. 11/13/14   Charlesetta Shanks, MD  ipratropium-albuterol (DUONEB) 0.5-2.5 (3) MG/3ML SOLN TAKE 3 MLS BY NEBULIZATION EVERY 4 (FOUR) HOURS AS NEEDED (FOR WHEEZING). 04/23/14   Tammy S Parrett, NP  Lactulose 20 GM/30ML SOLN Take 15-30 mLs by mouth daily.    Historical Provider, MD  loratadine (CLARITIN)  10 MG tablet TAKE 1 TABLET (10 MG TOTAL) BY MOUTH DAILY. 09/16/14   Chesley Mires, MD  metoprolol tartrate (LOPRESSOR) 25 MG tablet Take 1 tablet (25 mg total) by mouth 2 (two) times daily. 09/28/11 07/30/14  Emily Filbert, MD  montelukast (SINGULAIR) 10 MG tablet TAKE 1 TABLET (10 MG TOTAL) BY MOUTH AT BEDTIME. 08/03/14   Chesley Mires, MD  Multiple Vitamins-Minerals (MULTIVITAMIN WITH MINERALS) tablet Take 1 tablet by mouth daily.    Historical Provider, MD  ondansetron (ZOFRAN ODT) 4 MG disintegrating tablet 4mg  ODT q4 hours prn nausea/vomit 11/13/14   Charlesetta Shanks, MD  Oxycodone HCl 10 MG TABS Take 10 mg by mouth 3 (three) times daily.    Historical Provider, MD  OXYGEN-HELIUM IN by Other route. Uses 2 Liters of Oxygen Continuously    Historical Provider, MD  pantoprazole (PROTONIX) 40 MG tablet TAKE 1 TABLET (40 MG TOTAL) BY MOUTH DAILY. 09/20/14   Tammy S Parrett, NP  potassium chloride SA (K-DUR,KLOR-CON) 20 MEQ tablet Take 20 mEq by mouth 2 (two) times daily. 10/22/11 07/30/14  Donnamae Jude, MD  pregabalin (LYRICA) 75 MG capsule Take 75 mg by mouth 2 (two) times daily.    Historical Provider, MD  PROAIR HFA 108 (90 BASE) MCG/ACT inhaler INHALE 2 PUFFS INTO THE LUNGS EVERY 6 (SIX) HOURS AS NEEDED FOR WHEEZING OR SHORTNESS OF BREATH. 09/20/14   Chesley Mires, MD  PULMICORT 0.5 MG/2ML nebulizer solution USE 1 VIAL VIA NEBULIZER TWICE DAILY 04/23/14   Tammy S Parrett, NP  rizatriptan (MAXALT) 10 MG tablet Take 1 tablet (10 mg total) by mouth daily as needed. May repeat in 2 hours if needed 01/07/14   Marcial Pacas, MD  topiramate (TOPAMAX) 100 MG tablet bid 01/07/14   Marcial Pacas, MD   BP 113/54 mmHg  Pulse 99  Temp(Src) 99.2 F (37.3 C) (Oral)  Resp 20  Ht 5\' 2"  (1.575 m)  Wt 300 lb (136.079 kg)  BMI 54.86 kg/m2  SpO2 99%  LMP 10/25/2014 Physical Exam  Constitutional: She is oriented to person, place, and time. She appears well-developed and well-nourished.  HENT:  Head: Normocephalic and atraumatic.  Eyes:  EOM are normal. Pupils are equal, round, and reactive to light.  Neck: Neck supple.  Cardiovascular: Normal rate, regular rhythm, normal heart sounds and intact distal pulses.   Pulmonary/Chest: Effort normal and breath sounds normal.  Abdominal: Soft. Bowel sounds are normal. She exhibits no distension. There is tenderness.  Patient is morbidly obese abdomen. With movement of pannus and deeper palpation, pain localizes to the low suprapubic region. Mass  cannot be determined due to body habitus. Patient is denying pain in the inguinal region. She does not endorse pain in the upper or lateral abdomen.  Genitourinary:  Normal external genitalia. Speculum examination small moderate amount of whitish discharge in the vaginal vault. Bimanual examination no cervical motion tenderness. No significant uterine or adnexal tenderness to palpation.  Musculoskeletal: Normal range of motion. She exhibits no edema.  Neurological: She is alert and oriented to person, place, and time. She has normal strength. Coordination normal. GCS eye subscore is 4. GCS verbal subscore is 5. GCS motor subscore is 6.  Skin: Skin is warm, dry and intact.  Psychiatric: She has a normal mood and affect.    ED Course  Procedures (including critical care time) Labs Review Labs Reviewed  WET PREP, GENITAL - Abnormal; Notable for the following:    Clue Cells Wet Prep HPF POC FEW (*)    WBC, Wet Prep HPF POC MODERATE (*)    All other components within normal limits  URINALYSIS, ROUTINE W REFLEX MICROSCOPIC (NOT AT [facility]) - Abnormal; Notable for the following:    APPearance CLOUDY (*)    Leukocytes, UA SMALL (*)    All other components within normal limits  CBC WITH DIFFERENTIAL/PLATELET - Abnormal; Notable for the following:    WBC 11.1 (*)    Hemoglobin 11.4 (*)    MCH 24.4 (*)    RDW 17.2 (*)    All other components within normal limits  BASIC METABOLIC PANEL - Abnormal; Notable for the following:    Potassium 3.0 (*)     Glucose, Bld 105 (*)    All other components within normal limits  URINE MICROSCOPIC-ADD ON - Abnormal; Notable for the following:    Squamous Epithelial / LPF FEW (*)    Bacteria, UA MANY (*)    All other components within normal limits  PREGNANCY, URINE  GC/CHLAMYDIA PROBE AMP ([place]) NOT AT [facility]    Imaging Review No results found.   EKG Interpretation None      MDM   Final diagnoses:  Right lower quadrant abdominal pain   Patient presents with lower abdominal pain predominantly to the right side. This approximate 3 days duration. Pelvic examination did not reveal significant tenderness to suggest PID or cervicitis. Patient denies sexual activity for greater than a month. At this point CT scan will be obtained to rule patient out for appendicitis. If this is negative I feel patient is safe for discharge with ibuprofen and Zofran for symptomatic control and outpatient follow-up.    Charlesetta Shanks, MD 11/13/14 [phone]

## 2014-11-12 NOTE — ED Notes (Signed)
RLQ pain x 3 days.  LBM today-normal.  Denies vomiting, reports nausea.  Denies fever.

## 2014-11-12 NOTE — Progress Notes (Signed)
[name] states that her daughter wears a 2 liter nasal cannula at night.  RT placed patient on 2 liter nasal cannula per the patient's home regimen.

## 2014-11-13 DIAGNOSIS — R1031 Right lower quadrant pain: Secondary | ICD-10-CM | POA: Diagnosis not present

## 2014-11-13 MED ORDER — IOHEXOL 300 MG/ML  SOLN
100.0000 mL | Freq: Once | INTRAMUSCULAR | Status: AC | PRN
  Administered 2014-11-13: 100 mL via INTRAVENOUS

## 2014-11-13 MED ORDER — IBUPROFEN 800 MG PO TABS: 800.0000 mg | ORAL_TABLET | Freq: Three times a day (TID) | ORAL | Status: DC

## 2014-11-13 MED ORDER — ONDANSETRON 4 MG PO TBDP: ORAL_TABLET | ORAL | Status: DC

## 2014-11-13 NOTE — Discharge Instructions (Signed)
Abdominal Pain, Women °Abdominal (stomach, pelvic, or belly) pain can be caused by many things. It is important to tell your doctor: °· The location of the pain. °· Does it come and go or is it present all the time? °· Are there things that start the pain (eating certain foods, exercise)? °· Are there other symptoms associated with the pain (fever, nausea, vomiting, diarrhea)? °All of this is helpful to know when trying to find the cause of the pain. °CAUSES  °· Stomach: virus or bacteria infection, or ulcer. °· Intestine: appendicitis (inflamed appendix), regional ileitis (Crohn's disease), ulcerative colitis (inflamed colon), irritable bowel syndrome, diverticulitis (inflamed diverticulum of the colon), or cancer of the stomach or intestine. °· Gallbladder disease or stones in the gallbladder. °· Kidney disease, kidney stones, or infection. °· Pancreas infection or cancer. °· Fibromyalgia (pain disorder). °· Diseases of the female organs: °¨ Uterus: fibroid (non-cancerous) tumors or infection. °¨ Fallopian tubes: infection or tubal pregnancy. °¨ Ovary: cysts or tumors. °¨ Pelvic adhesions (scar tissue). °¨ Endometriosis (uterus lining tissue growing in the pelvis and on the pelvic organs). °¨ Pelvic congestion syndrome (female organs filling up with blood just before the menstrual period). °¨ Pain with the menstrual period. °¨ Pain with ovulation (producing an egg). °¨ Pain with an IUD (intrauterine device, birth control) in the uterus. °¨ Cancer of the female organs. °· Functional pain (pain not caused by a disease, may improve without treatment). °· Psychological pain. °· Depression. °DIAGNOSIS  °Your doctor will decide the seriousness of your pain by doing an examination. °· Blood tests. °· X-rays. °· Ultrasound. °· CT scan (computed tomography, special type of X-ray). °· MRI (magnetic resonance imaging). °· Cultures, for infection. °· Barium enema (dye inserted in the large intestine, to better view it with  X-rays). °· Colonoscopy (looking in intestine with a lighted tube). °· Laparoscopy (minor surgery, looking in abdomen with a lighted tube). °· Major abdominal exploratory surgery (looking in abdomen with a large incision). °TREATMENT  °The treatment will depend on the cause of the pain.  °· Many cases can be observed and treated at home. °· Over-the-counter medicines recommended by your caregiver. °· Prescription medicine. °· Antibiotics, for infection. °· Birth control pills, for painful periods or for ovulation pain. °· Hormone treatment, for endometriosis. °· Nerve blocking injections. °· Physical therapy. °· Antidepressants. °· Counseling with a psychologist or psychiatrist. °· Minor or major surgery. °HOME CARE INSTRUCTIONS  °· Do not take laxatives, unless directed by your caregiver. °· Take over-the-counter pain medicine only if ordered by your caregiver. Do not take aspirin because it can cause an upset stomach or bleeding. °· Try a clear liquid diet (broth or water) as ordered by your caregiver. Slowly move to a bland diet, as tolerated, if the pain is related to the stomach or intestine. °· Have a thermometer and take your temperature several times a day, and record it. °· Bed rest and sleep, if it helps the pain. °· Avoid sexual intercourse, if it causes pain. °· Avoid stressful situations. °· Keep your follow-up appointments and tests, as your caregiver orders. °· If the pain does not go away with medicine or surgery, you may try: °¨ Acupuncture. °¨ Relaxation exercises (yoga, meditation). °¨ Group therapy. °¨ Counseling. °SEEK MEDICAL CARE IF:  °· You notice certain foods cause stomach pain. °· Your home care treatment is not helping your pain. °· You need stronger pain medicine. °· You want your IUD removed. °· You feel faint or   lightheaded.  You develop nausea and vomiting.  You develop a rash.  You are having side effects or an allergy to your medicine. SEEK IMMEDIATE MEDICAL CARE IF:   Your  pain does not go away or gets worse.  You have a fever.  Your pain is felt only in portions of the abdomen. The right side could possibly be appendicitis. The left lower portion of the abdomen could be colitis or diverticulitis.  You are passing blood in your stools (bright red or black tarry stools, with or without vomiting).  You have blood in your urine.  You develop chills, with or without a fever.  You pass out. MAKE SURE YOU:   Understand these instructions.  Will watch your condition.  Will get help right away if you are not doing well or get worse. Document Released: 01/28/2007 Document Revised: 08/17/2013 Document Reviewed: 02/17/2009 [facility] Patient Information 2015 [place], [place]. This information is not intended to replace advice given to you by your health care provider. Make sure you discuss any questions you have with your health care provider.  Emergency Department Resource Guide 1) Find a Doctor and Pay Out of Pocket Although you won't have to find out who is covered by your insurance plan, it is a good idea to ask around and get recommendations. You will then need to call the office and see if the doctor you have chosen will accept you as a new patient and what types of options they offer for patients who are self-pay. Some doctors offer discounts or will set up payment plans for their patients who do not have insurance, but you will need to ask so you aren't surprised when you get to your appointment.  2) Contact Your Local Health Department Not all health departments have doctors that can see patients for sick visits, but many do, so it is worth a call to see if yours does. If you don't know where your local health department is, you can check in your phone book. The CDC also has a tool to help you locate your state's health department, and many state websites also have listings of all of their local health departments.  3) Find a [facility] If your illness is  not likely to be very severe or complicated, you may want to try a walk in clinic. These are popping up all over the country in pharmacies, drugstores, and shopping centers. They're usually staffed by nurse practitioners or physician assistants that have been trained to treat common illnesses and complaints. They're usually fairly quick and inexpensive. However, if you have serious medical issues or chronic medical problems, these are probably not your best option.  No Primary Care Doctor: - Call Health Connect at  [phone] - they can help you locate a primary care doctor that  accepts your insurance, provides certain services, etc. - Physician Referral Service- [phone]  Chronic Pain Problems: Organization         Address  Phone   Notes  [facility]  [phone] Patients need to be referred by their primary care doctor.   Medication Assistance: Organization         Address  Phone   Notes  [facility] Medication [facility] [address] [place],  [address] [phone] --Must be a resident of [facility] -- Must have NO insurance coverage whatsoever (no Medicaid/ Medicare, etc.) -- The pt. MUST have a primary care doctor that directs their care regularly  and follows them in the community   Med[facility]ist  [phone]   [employer]  [phone]    Agencies that provide inexpensive medical care: Organization         Address  Phone   Notes  [place]  [phone]   [facility]    [phone]   [facility] [place], [place] [address] [phone]   [place]. [address] [place] [phone]   Planned Parenthood    [phone]   [facility]    [phone]   [place] and [address], [place] Phone:  [phone], Fax:  [phone] Hours of Operation:  9 am - 6  pm, M-F.  Also accepts Medicaid/Medicare and self-pay.  [facility] for [place] [address], [address], [place] Phone: [phone], Fax: [phone]. Hours of Operation:  8:30 am - 5:30 pm, M-F.  Also accepts Medicaid and self-pay.  [facility] [address], [place] Phone: [phone]   [place], [place], [place] [phone], Ext. [phone] Mondays & Thursdays: 7-9 AM.  First 15 patients are seen on a first come, first serve basis.    [place] Providers:  Organization         Address  Phone   Notes  [facility] [address], [place] [phone] Also accepts self-pay patients.  [facility] [address], [place]  [phone]   [place], [address], [place] [phone]   [facility] [address], [place] [phone]   Lucianne Lei [address], [place]   [phone] Only accepts [place] Access [place] patients after they have their name applied to their card.   Self-Pay (no insurance) in [facility]:  Organization         Address  Phone   Notes  Sickle Cell Patients, [facility] Internal Medicine [place] [phone]   [facility] Urgent Care [place] [phone]   [facility] [place]  [place], [place], [place] [phone]   [facility]/Dr. Osei-Bonsu  [address], [place] or [address], [place] [phone] Phone number for both [place] and [place] locations is the same.  Urgent Medical and [facility] [address] [place] [phone]   [facility] [address] [place] or [address] [phone]   [facility] [address] [place] [phone], phone; [phone], fax Sees patients 1st and 3rd Saturday of every month.  Must not qualify for public or private insurance (i.e. Medicaid, Medicare, [place] Health Choice, Veterans' Benefits)  Household income should be no more than 200% of the poverty level The clinic cannot treat you if you are pregnant or think you are pregnant  Sexually transmitted diseases are not treated at the clinic.    Dental Care: Organization         Address  Phone  Notes  [facility] Department of [facility] [address] [place], [place] [phone] Accepts children up to age 53 who  are enrolled in Medicaid or [place] Health Choice; pregnant women with a Medicaid card; and children who have applied for Medicaid or [place] Health Choice, but were declined, whose parents can pay a reduced fee at time of service.  [facility] Department of [facility]  [address], [place] [phone] Accepts children up to age 62 who are enrolled in [place] or [place]; pregnant women with a Medicaid card; and children who have applied for Medicaid or [place] Health Choice, but were declined, whose parents can pay a reduced fee at time of service.  [place] Adult Dental Access PROGRAM  [place] [phone] Patients are seen by appointment only. Walk-ins are not accepted. [place] will see patients 44 years of age and older. Monday - Tuesday (8am-5pm) Most Wednesdays (8:30-5pm) $30 per visit, cash only  [facility] Adult Dental Access PROGRAM  [address], [facility] [phone] Patients are seen by appointment only. Walk-ins are not accepted. [place] will see patients 50 years of age and older. One Wednesday Evening (Monthly: Volunteer Based).  $30 per visit, cash only  [place]  [phone] for adults; Children under age 43, call Graduate Pediatric Dentistry at [phone]. Children aged 25-14, please call [phone] to request a pediatric application.  Dental services are provided in all areas of dental care including fillings, crowns and bridges, complete and partial dentures, implants, gum treatment, root canals, and extractions. Preventive care is also provided. Treatment is provided to both adults and children. Patients are selected via a lottery and there is often a waiting list.   [facility] [address] [place]  [phone] [email or web address]   [facility] [address] [place], [place] [phone], Ext. [phone] Second and Fourth Thursday of each month, opens at 6:30 AM; Clinic ends at 9 AM.  Patients are seen on a first-come first-served basis, and a limited number are seen during each clinic.   [facility]  [address] Hillard Danker [place], [place] [phone]   Eligibility Requirements You must have lived in [place], [place], or [place] counties for at least the last three months.   You cannot be eligible for state or federal sponsored [employer], including [employer], [place], or [employer].   You generally cannot be eligible for healthcare insurance through your employer.    How to apply: Eligibility screenings are held every Tuesday and Wednesday afternoon from 1:00 pm until 4:00 pm. You do not need an appointment for the interview!  [facility] [address] [place], [place]   [place]  [place] Department  [place]  [phone]    Behavioral Health Resources in the Community: Intensive Outpatient Programs Organization         Address  Phone  Notes  [place]. [address] [place], [place] [phone]   [facility] Outpatient [address], [place], [place]   ADS: Alcohol & Drug Svcs  [address] [place], [place]   [place] [address]  [place], [place] or [phone]   Substance Abuse Resources Organization         Address  Phone  Notes  Alcohol and Drug Services  [place]  [phone]  [facility] [place]   Chinita Pester  [phone]   Residential & Outpatient Substance Abuse Program  [phone]   Psychological Services Organization         Address  Phone  Notes  [facility] [place]  [place]  [phone]   [place] [address] [place] or [phone]    Mobile Crisis Teams Organization         Address  Phone  Notes  [facility], Mobile Crisis Care Unit  [phone]   Assertive Psychotherapeutic Services  [address] [place], [place]   [place] Levels [address] [place] [phone]    Self-Help/Support Groups Organization         Address  Phone             Notes  [place]. of [place] - variety of support groups  [place] Call for more information  Narcotics Anonymous (NA), [facility]aring [facility] [address], [employer] [place]  2 meetings at this location   Special educational needs teacher         Address  Phone  Notes  [facility]AP Residential [facility] [place],    [place]  [phone]   [facility]  [address] [place] [address], [place], [place]   [place], [place] [phone] Admissions: 8am-3pm M-F  Incentives Substance [place] [address]    [place], [place] [phone]   [facility] [address] [place], [place], [place]   [facility] [address]  [place], [place]   [facility] Programs - Intensive Outpatient [address] Kristeen [address], [place], [place]   [facility] ([place].) [place].,  [place], [place] [phone] or [phone]   [facility]idential Treatment Services (RTS) [address] [place], [place] Accepts Medicaid  [facility] [address],  [place] [phone] Substance Abuse/Addiction Treatment   [facility] Organization         Address  Phone  Notes  [facility]  [phone]   Domenic Schwab, PhD [address] Arlis [place], [place]   [phone] or [phone]   [place], [place] [phone]   [facility] [address], [place], [place] [phone] Insurance/Medicaid/sponsorship through [facility] and Families [address] [place]                                    [place], [place] [phone] [place] [address], [place] [phone]    Dr. Adele Schilder  [phone]   [facility] [place] Dept. 1) [address], [place] 2) [place] 3)  [place] [address], [place] [phone]  [phone]   [place] [phone] or [phone] (After Hours)

## 2014-11-13 NOTE — ED Provider Notes (Signed)
Patient initially seen and admitted by Dr. Johnney Killian signed out to me to evaluate results of CT scan to rule out appendicitis. CT has come back unremarkable. She is discharged with prescriptions for ibuprofen and ondansetron per Dr. Johnney Killian.  Results for orders placed or performed during the hospital encounter of 11/12/14  Wet prep, genital  Result Value Ref Range   Yeast Wet Prep HPF POC NONE SEEN NONE SEEN   Trich, Wet Prep NONE SEEN NONE SEEN   Clue Cells Wet Prep HPF POC FEW (A) NONE SEEN   WBC, Wet Prep HPF POC MODERATE (A) NONE SEEN  Urinalysis, Routine w reflex microscopic (not at [facility])  Result Value Ref Range   Color, Urine YELLOW YELLOW   APPearance CLOUDY (A) CLEAR   Specific Gravity, Urine 1.007 1.005 - 1.030   pH 7.0 5.0 - 8.0   Glucose, UA NEGATIVE NEGATIVE mg/dL   Hgb urine dipstick NEGATIVE NEGATIVE   Bilirubin Urine NEGATIVE NEGATIVE   Ketones, ur NEGATIVE NEGATIVE mg/dL   Protein, ur NEGATIVE NEGATIVE mg/dL   Urobilinogen, UA 1.0 0.0 - 1.0 mg/dL   Nitrite NEGATIVE NEGATIVE   Leukocytes, UA SMALL (A) NEGATIVE  Pregnancy, urine  Result Value Ref Range   Preg Test, Ur NEGATIVE NEGATIVE  CBC with Differential  Result Value Ref Range   WBC 11.1 (H) 4.0 - 10.5 K/uL   RBC 4.68 3.87 - 5.11 MIL/uL   Hemoglobin 11.4 (L) 12.0 - 15.0 g/dL   HCT 36.5 36.0 - 46.0 %   MCV 78.0 78.0 - 100.0 fL   MCH 24.4 (L) 26.0 - 34.0 pg   MCHC 31.2 30.0 - 36.0 g/dL   RDW 17.2 (H) 11.5 - 15.5 %   Platelets 359 150 - 400 K/uL   Neutrophils Relative % 69 43 - 77 %   Neutro Abs 7.7 1.7 - 7.7 K/uL   Lymphocytes Relative 21 12 - 46 %   Lymphs Abs 2.3 0.7 - 4.0 K/uL   Monocytes Relative 7 3 - 12 %   Monocytes Absolute 0.7 0.1 - 1.0 K/uL   Eosinophils Relative 3 0 - 5 %   Eosinophils Absolute 0.3 0.0 - 0.7 K/uL   Basophils Relative 0 0 - 1 %   Basophils Absolute 0.0 0.0 - 0.1 K/uL  Basic metabolic panel  Result Value Ref Range   Sodium 138 135 - 145 mmol/L   Potassium 3.0 (L) 3.5 - 5.1  mmol/L   Chloride 104 101 - 111 mmol/L   CO2 23 22 - 32 mmol/L   Glucose, Bld 105 (H) 65 - 99 mg/dL   BUN 12 6 - 20 mg/dL   Creatinine, Ser 0.83 0.44 - 1.00 mg/dL   Calcium 9.3 8.9 - 10.3 mg/dL   GFR calc non Af Amer >60 >60 mL/min   GFR calc Af Amer >60 >60 mL/min   Anion gap 11 5 - 15  Urine microscopic-add on  Result Value Ref Range   Squamous Epithelial / LPF FEW (A) RARE   WBC, UA 3-6 <3 WBC/hpf   RBC / HPF 0-2 <3 RBC/hpf   Bacteria, UA MANY (A) RARE   Ct Abdomen Pelvis W Contrast  11/13/2014   CLINICAL DATA:  Right lower quadrant pain for 3 days.  EXAM: CT ABDOMEN AND PELVIS WITH CONTRAST  TECHNIQUE: Multidetector CT imaging of the abdomen and pelvis was performed using the standard protocol following bolus administration of intravenous contrast.  CONTRAST:  58mL OMNIPAQUE IOHEXOL 300 MG/ML SOLN, 129mL OMNIPAQUE IOHEXOL  300 MG/ML SOLN  COMPARISON:  05/27/2013  FINDINGS: There are unremarkable appearances of the liver, spleen, pancreas, adrenals and kidneys. Gallbladder and bile ducts appear unremarkable.  The appendix is normal.  Remainder of the bowel is unremarkable.  Uterus and ovaries are unremarkable.  The abdominal aorta is normal in caliber. There is no atherosclerotic calcification. There is no adenopathy in the abdomen or pelvis.  No acute inflammatory changes are evident in the abdomen or pelvis. There is no ascites.  Mild unchanged linear scarring is present in the posterior bases. There is no significant musculoskeletal abnormality. Incidentally noted fat containing umbilical hernia  IMPRESSION: No acute findings are evident in the abdomen or pelvis. No significant abnormality.   Electronically Signed   By: Andreas Newport M.D.   On: 11/13/2014 01:28    Images viewed by me.   Delora Fuel, MD 19/62/22 [id number]

## 2014-11-15 ENCOUNTER — Telehealth: Payer: Self-pay | Admitting: *Deleted

## 2014-11-15 ENCOUNTER — Other Ambulatory Visit: Payer: Self-pay | Admitting: Hematology and Oncology

## 2014-11-15 ENCOUNTER — Telehealth: Payer: Self-pay | Admitting: Hematology and Oncology

## 2014-11-15 LAB — GC/CHLAMYDIA PROBE AMP (~~LOC~~) NOT AT ARMC
CHLAMYDIA, DNA PROBE: NEGATIVE
Neisseria Gonorrhea: NEGATIVE

## 2014-11-15 NOTE — Telephone Encounter (Signed)
I placed POF and will remind precert

## 2014-11-15 NOTE — Telephone Encounter (Signed)
Informed pt of labs received from Dr. Arelia Sneddon office and iron levels are low.   Pt says she is taking oral supplement once a day, but she doesn't think it is working for her and has had to get iv iron in the past.  Pt can come in to see Dr. Alvy Bimler next week on Monday 8/8 at 11:30 am.   She will be prepared to stay for IV iron infusion afterwards.   Pt wants to remind Dr. Alvy Bimler she gets pre meds for her IV iron.  I also informed pt that the doses of IV iron are now being split in half and given a week apart.   So she may need to come the following week for another infusion as well.   She verbalized understanding and will be here next week 8/8 at 11:30 am..

## 2014-11-15 NOTE — Telephone Encounter (Signed)
s.w. pt and advised on 8.8. and 8.15 appt.Marland KitchenMarland KitchenMarland KitchenMarland Kitchenpt ok and aware

## 2014-11-22 ENCOUNTER — Telehealth: Payer: Self-pay | Admitting: Hematology and Oncology

## 2014-11-22 ENCOUNTER — Other Ambulatory Visit: Payer: Self-pay | Admitting: Hematology and Oncology

## 2014-11-22 ENCOUNTER — Ambulatory Visit: Payer: Self-pay

## 2014-11-22 ENCOUNTER — Ambulatory Visit: Payer: Self-pay | Admitting: Hematology and Oncology

## 2014-11-22 NOTE — Telephone Encounter (Signed)
returned pt call and r/s appt due to her at another appt....pt ok and aware of new d.t

## 2014-11-27 ENCOUNTER — Other Ambulatory Visit (HOSPITAL_COMMUNITY): Payer: Self-pay | Admitting: Psychiatry

## 2014-11-29 ENCOUNTER — Ambulatory Visit (HOSPITAL_BASED_OUTPATIENT_CLINIC_OR_DEPARTMENT_OTHER): Payer: Medicare Other | Admitting: Hematology and Oncology

## 2014-11-29 ENCOUNTER — Ambulatory Visit (HOSPITAL_BASED_OUTPATIENT_CLINIC_OR_DEPARTMENT_OTHER): Payer: Medicare Other

## 2014-11-29 ENCOUNTER — Ambulatory Visit: Payer: Self-pay

## 2014-11-29 ENCOUNTER — Telehealth: Payer: Self-pay | Admitting: Hematology and Oncology

## 2014-11-29 VITALS — BP 126/82 | HR 117 | Temp 98.6°F | Resp 18 | Ht 62.0 in | Wt 293.2 lb

## 2014-11-29 DIAGNOSIS — D509 Iron deficiency anemia, unspecified: Secondary | ICD-10-CM

## 2014-11-29 DIAGNOSIS — N920 Excessive and frequent menstruation with regular cycle: Secondary | ICD-10-CM

## 2014-11-29 MED ORDER — SODIUM CHLORIDE 0.9 % IV SOLN
Freq: Once | INTRAVENOUS | Status: AC
  Administered 2014-11-29: 15:00:00 via INTRAVENOUS

## 2014-11-29 MED ORDER — SODIUM CHLORIDE 0.9 % IV SOLN
510.0000 mg | Freq: Once | INTRAVENOUS | Status: AC
  Administered 2014-11-29: 510 mg via INTRAVENOUS
  Filled 2014-11-29: qty 17

## 2014-11-29 MED ORDER — METHYLPREDNISOLONE SODIUM SUCC 125 MG IJ SOLR
40.0000 mg | Freq: Once | INTRAMUSCULAR | Status: AC
  Administered 2014-11-29: 40 mg via INTRAVENOUS

## 2014-11-29 MED ORDER — METHYLPREDNISOLONE SODIUM SUCC 40 MG IJ SOLR
INTRAMUSCULAR | Status: AC
  Filled 2014-11-29: qty 1

## 2014-11-29 MED ORDER — DIPHENHYDRAMINE HCL 25 MG PO CAPS
50.0000 mg | ORAL_CAPSULE | Freq: Four times a day (QID) | ORAL | Status: DC | PRN
  Administered 2014-11-29: 50 mg via ORAL

## 2014-11-29 MED ORDER — ACETAMINOPHEN 325 MG PO TABS
650.0000 mg | ORAL_TABLET | Freq: Four times a day (QID) | ORAL | Status: DC | PRN
Start: 2014-11-29 — End: 2014-11-29
  Administered 2014-11-29: 650 mg via ORAL

## 2014-11-29 MED ORDER — ACETAMINOPHEN 325 MG PO TABS
ORAL_TABLET | ORAL | Status: AC
  Filled 2014-11-29: qty 2

## 2014-11-29 MED ORDER — DIPHENHYDRAMINE HCL 25 MG PO CAPS
ORAL_CAPSULE | ORAL | Status: AC
  Filled 2014-11-29: qty 2

## 2014-11-29 NOTE — Assessment & Plan Note (Signed)
I suspect the patient may have a combination of thalassemia, anemia chronic disease and iron deficiency anemia. Despite high ferritin level, her anemia did not correct itself. Hemoglobinopathy evaluation is normal but this does not exclude alpha thalassemia.  She complained of persistent menorrhagia and is not able to tolerate estrogen containing implants. The most likely cause of her anemia is due to chronic blood loss. We discussed some of the risks, benefits, and alternatives of intravenous iron infusions. The patient is symptomatic from anemia and the iron level is critically low. She tolerated oral iron supplement poorly and desires to achieved higher levels of iron faster for adequate hematopoesis. Some of the side-effects to be expected including risks of infusion reactions, phlebitis, headaches, nausea and fatigue.  The patient is willing to proceed. Patient education material was dispensed.  Goal is to keep ferritin level greater than 50

## 2014-11-29 NOTE — Assessment & Plan Note (Signed)
She has heavy menstruation and have a history of abnormal Pap smear. She was not able to tolerate estrogen containing implant as it caused significant weight gain and swelling. I would defer to her gynecologist to discuss about treatment options.

## 2014-11-29 NOTE — Telephone Encounter (Signed)
[facility] Abilify. Per Dr. De Nurse, pt will need contact the office for an appointment.

## 2014-11-29 NOTE — Telephone Encounter (Signed)
Gave avs & appointments.

## 2014-11-29 NOTE — Progress Notes (Signed)
[facility] NOTE  Cheyenne Downing, MD SUMMARY OF HEMATOLOGIC HISTORY:  This patient have chronic fatigue, fibromyalgia, uterine fibroids with chronic menorrhagia, severe iron deficiency and reactive thrombocytosis. She had chronic microcytic anemia for many years. In fact, there were no changes with MCV and the highest hemoglobin was 10.7. She had received intermittent intravenous iron infusion in the past, her last infusion was in January 2014. The patient could not tolerate oral iron supplements. She have chronic fatigue with fibromyalgia, obstructive sleep apnea and have been oxygen therapy for 2 years. She takes regular ibuprofen for pelvic pain. She has irregular menstruation and abnormal Pap smear in the past The patient had history of intermittent epistaxis for many years, usually coming from the left nasal passage. The patient has severe microcytic anemia. She received one dose of iron infusion on 11/04/2013  INTERVAL HISTORY: Cheyenne Arias 39 y.o. female returns for further follow-up. Her last dose of IV iron was a year ago. She has to have the implant removed due to significant weight gain and swelling. She continues to have heavy menstruation. The patient denies any recent signs or symptoms of bleeding such as spontaneous epistaxis, hematuria or hematochezia. She complained of fatigue  I have reviewed the past medical history, past surgical history, social history and family history with the patient and they are unchanged from previous note.  ALLERGIES:  is allergic to latex and morphine and related.  MEDICATIONS:  Current Outpatient Prescriptions  Medication Sig Dispense Refill  . amLODipine (NORVASC) 10 MG tablet Take 10 mg by mouth daily.    . ARIPiprazole (ABILIFY) 2 MG tablet Take 1 tablet (2 mg total) by mouth daily. 30 tablet 1  . clonazePAM (KLONOPIN) 0.5 MG tablet Take 1 tablet (0.5 mg total) by mouth 2 (two) times daily as  needed for anxiety. 60 tablet 1  . cyclobenzaprine (FLEXERIL) 10 MG tablet Take 10 mg by mouth 3 (three) times daily as needed. For muscle spasms    . ferumoxytol (FERAHEME) 510 MG/17ML SOLN injection Inject 510 mg into the vein every 3 (three) months.    [name] FLUoxetine (PROZAC) 20 MG tablet Please give Capsules of prozac instead of tablets. She tolerates capsules better 30 tablet 1  . fluticasone (FLONASE) 50 MCG/ACT nasal spray PLACE 2 SPRAYS INTO BOTH NOSTRILS 2 (TWO) TIMES DAILY. 16 g 2  . ibuprofen (ADVIL,MOTRIN) 800 MG tablet Take 800 mg by mouth 3 (three) times daily.    [name] ibuprofen (ADVIL,MOTRIN) 800 MG tablet Take 1 tablet (800 mg total) by mouth 3 (three) times daily. 21 tablet 0  . ipratropium-albuterol (DUONEB) 0.5-2.5 (3) MG/3ML SOLN TAKE 3 MLS BY NEBULIZATION EVERY 4 (FOUR) HOURS AS NEEDED (FOR WHEEZING). 360 mL 5  . Lactulose 20 GM/30ML SOLN Take 15-30 mLs by mouth daily.    [name] loratadine (CLARITIN) 10 MG tablet TAKE 1 TABLET (10 MG TOTAL) BY MOUTH DAILY. 30 tablet 2  . montelukast (SINGULAIR) 10 MG tablet TAKE 1 TABLET (10 MG TOTAL) BY MOUTH AT BEDTIME. 30 tablet 2  . Multiple Vitamins-Minerals (MULTIVITAMIN WITH MINERALS) tablet Take 1 tablet by mouth daily.    . ondansetron (ZOFRAN ODT) 4 MG disintegrating tablet 4mg  ODT q4 hours prn nausea/vomit 10 tablet 0  . Oxycodone HCl 10 MG TABS Take 10 mg by mouth 3 (three) times daily.    . OXYGEN-HELIUM IN by Other route. Uses 2 Liters of Oxygen Continuously    . pantoprazole (PROTONIX) 40 MG tablet TAKE 1 TABLET (  40 MG TOTAL) BY MOUTH DAILY. 30 tablet 3  . pregabalin (LYRICA) 75 MG capsule Take 75 mg by mouth 2 (two) times daily.    [name] PROAIR HFA 108 (90 BASE) MCG/ACT inhaler INHALE 2 PUFFS INTO THE LUNGS EVERY 6 (SIX) HOURS AS NEEDED FOR WHEEZING OR SHORTNESS OF BREATH. 8.5 Inhaler 0  . PULMICORT 0.5 MG/2ML nebulizer solution USE 1 VIAL VIA NEBULIZER TWICE DAILY 120 mL 5  . rizatriptan (MAXALT) 10 MG tablet Take 1 tablet (10 mg total) by  mouth daily as needed. May repeat in 2 hours if needed 15 tablet 11  . topiramate (TOPAMAX) 100 MG tablet bid 60 tablet 11  . furosemide (LASIX) 40 MG tablet Take 1 tablet (40 mg total) by mouth daily. 10 tablet 1  . metoprolol tartrate (LOPRESSOR) 25 MG tablet Take 1 tablet (25 mg total) by mouth 2 (two) times daily. 360 tablet 6  . potassium chloride SA (K-DUR,KLOR-CON) 20 MEQ tablet Take 20 mEq by mouth 2 (two) times daily.    . [DISCONTINUED] zolpidem (AMBIEN) 5 MG tablet Take 5 mg by mouth at bedtime as needed for sleep.      No current facility-administered medications for this visit.   Facility-Administered Medications Ordered in Other Visits  Medication Dose Route Frequency Provider Last Rate Last Dose  . 0.9 %  sodium chloride infusion   Intravenous Once Heath Lark, MD      . acetaminophen (TYLENOL) tablet 650 mg  650 mg Oral Q6H PRN Heath Lark, MD   650 mg at 11/29/14 1433  . diphenhydrAMINE (BENADRYL) capsule 50 mg  50 mg Oral Q6H PRN Heath Lark, MD   50 mg at 11/29/14 1433  . ferumoxytol (FERAHEME) 510 mg in sodium chloride 0.9 % 100 mL IVPB  510 mg Intravenous Once Heath Lark, MD         REVIEW OF SYSTEMS:   Constitutional: Denies fevers, chills or night sweats Eyes: Denies blurriness of vision Ears, nose, mouth, throat, and face: Denies mucositis or sore throat Respiratory: Denies cough, dyspnea or wheezes Cardiovascular: Denies palpitation, chest discomfort or lower extremity swelling Gastrointestinal:  Denies nausea, heartburn or change in bowel habits Skin: Denies abnormal skin rashes Lymphatics: Denies new lymphadenopathy or easy bruising Neurological:Denies numbness, tingling or new weaknesses Behavioral/Psych: Mood is stable, no new changes  All other systems were reviewed with the patient and are negative.  PHYSICAL EXAMINATION: ECOG PERFORMANCE STATUS: 1 - Symptomatic but completely ambulatory  Filed Vitals:   11/29/14 1310  BP: 126/82  Pulse: 117  Temp:  98.6 F (37 C)  Resp: 18   Filed Weights   11/29/14 1310  Weight: 293 lb 3.2 oz (132.995 kg)    GENERAL:alert, no distress and comfortable. She is morbidly obese SKIN: skin color, texture, turgor are normal, no rashes or significant lesions EYES: normal, Conjunctiva are pink and non-injected, sclera clear Musculoskeletal:no cyanosis of digits and no clubbing  NEURO: alert & oriented x 3 with fluent speech, no focal motor/sensory deficits  LABORATORY DATA:  I have reviewed the data as listed No results found for this or any previous visit (from the past 48 hour(s)).  Lab Results  Component Value Date   WBC 11.1* 11/12/2014   HGB 11.4* 11/12/2014   HCT 36.5 11/12/2014   MCV 78.0 11/12/2014   PLT 359 11/12/2014   ASSESSMENT & PLAN:  Iron deficiency anemia I suspect the patient may have a combination of thalassemia, anemia chronic disease and iron deficiency anemia. Despite high  ferritin level, her anemia did not correct itself. Hemoglobinopathy evaluation is normal but this does not exclude alpha thalassemia.  She complained of persistent menorrhagia and is not able to tolerate estrogen containing implants. The most likely cause of her anemia is due to chronic blood loss. We discussed some of the risks, benefits, and alternatives of intravenous iron infusions. The patient is symptomatic from anemia and the iron level is critically low. She tolerated oral iron supplement poorly and desires to achieved higher levels of iron faster for adequate hematopoesis. Some of the side-effects to be expected including risks of infusion reactions, phlebitis, headaches, nausea and fatigue.  The patient is willing to proceed. Patient education material was dispensed.  Goal is to keep ferritin level greater than 50  Menorrhagia with regular cycle She has heavy menstruation and have a history of abnormal Pap smear. She was not able to tolerate estrogen containing implant as it caused significant  weight gain and swelling. I would defer to her gynecologist to discuss about treatment options.   All questions were answered. The patient knows to call the clinic with any problems, questions or concerns. No barriers to learning was detected.  I spent 15 minutes counseling the patient face to face. The total time spent in the appointment was 20 minutes and more than 50% was on counseling.     [facility], Harlan Vinal, MD 8/15/20162:48 PM

## 2014-11-29 NOTE — Patient Instructions (Signed)

## 2014-12-06 ENCOUNTER — Encounter: Payer: Self-pay | Admitting: Adult Health

## 2014-12-06 ENCOUNTER — Ambulatory Visit (HOSPITAL_BASED_OUTPATIENT_CLINIC_OR_DEPARTMENT_OTHER): Payer: Medicare Other

## 2014-12-06 VITALS — BP 120/62 | HR 112 | Temp 98.3°F | Resp 16

## 2014-12-06 DIAGNOSIS — D509 Iron deficiency anemia, unspecified: Secondary | ICD-10-CM | POA: Diagnosis not present

## 2014-12-06 MED ORDER — METHYLPREDNISOLONE SODIUM SUCC 40 MG IJ SOLR
INTRAMUSCULAR | Status: AC
  Filled 2014-12-06: qty 1

## 2014-12-06 MED ORDER — DIPHENHYDRAMINE HCL 25 MG PO CAPS
ORAL_CAPSULE | ORAL | Status: AC
  Filled 2014-12-06: qty 2

## 2014-12-06 MED ORDER — SODIUM CHLORIDE 0.9 % IV SOLN
510.0000 mg | Freq: Once | INTRAVENOUS | Status: AC
  Administered 2014-12-06: 510 mg via INTRAVENOUS
  Filled 2014-12-06: qty 17

## 2014-12-06 MED ORDER — SODIUM CHLORIDE 0.9 % IV SOLN
Freq: Once | INTRAVENOUS | Status: AC
  Administered 2014-12-06: 13:00:00 via INTRAVENOUS

## 2014-12-06 MED ORDER — DIPHENHYDRAMINE HCL 25 MG PO CAPS
50.0000 mg | ORAL_CAPSULE | Freq: Four times a day (QID) | ORAL | Status: DC | PRN
  Administered 2014-12-06: 50 mg via ORAL

## 2014-12-06 MED ORDER — METHYLPREDNISOLONE SODIUM SUCC 125 MG IJ SOLR
40.0000 mg | Freq: Once | INTRAMUSCULAR | Status: AC
  Administered 2014-12-06: 40 mg via INTRAVENOUS

## 2014-12-06 MED ORDER — ACETAMINOPHEN 325 MG PO TABS
ORAL_TABLET | ORAL | Status: AC
  Filled 2014-12-06: qty 2

## 2014-12-06 MED ORDER — ACETAMINOPHEN 325 MG PO TABS
650.0000 mg | ORAL_TABLET | Freq: Four times a day (QID) | ORAL | Status: DC | PRN
  Administered 2014-12-06: 650 mg via ORAL

## 2014-12-11 ENCOUNTER — Other Ambulatory Visit: Payer: Self-pay | Admitting: Adult Health

## 2014-12-13 ENCOUNTER — Ambulatory Visit (INDEPENDENT_AMBULATORY_CARE_PROVIDER_SITE_OTHER): Payer: Medicare Other | Admitting: Psychiatry

## 2014-12-13 ENCOUNTER — Encounter (HOSPITAL_COMMUNITY): Payer: Self-pay | Admitting: Psychiatry

## 2014-12-13 VITALS — BP 126/80 | HR 114 | Ht 62.0 in | Wt 288.0 lb

## 2014-12-13 DIAGNOSIS — F39 Unspecified mood [affective] disorder: Secondary | ICD-10-CM | POA: Diagnosis not present

## 2014-12-13 DIAGNOSIS — F410 Panic disorder [episodic paroxysmal anxiety] without agoraphobia: Secondary | ICD-10-CM

## 2014-12-13 DIAGNOSIS — F0630 Mood disorder due to known physiological condition, unspecified: Secondary | ICD-10-CM

## 2014-12-13 DIAGNOSIS — F331 Major depressive disorder, recurrent, moderate: Secondary | ICD-10-CM | POA: Diagnosis not present

## 2014-12-13 MED ORDER — ARIPIPRAZOLE 2 MG PO TABS: 2.0000 mg | ORAL_TABLET | Freq: Every day | ORAL | Status: DC

## 2014-12-13 MED ORDER — FLUOXETINE HCL 20 MG PO TABS: ORAL_TABLET | ORAL | Status: DC

## 2014-12-13 MED ORDER — CLONAZEPAM 0.5 MG PO TABS: 0.5000 mg | ORAL_TABLET | Freq: Two times a day (BID) | ORAL | Status: DC | PRN

## 2014-12-13 NOTE — Progress Notes (Signed)
Patient ID: Cheyenne Arias, female   DOB: Aug 15, 1975, 39 y.o.   MRN: [id number]  [facility] Follow up outpatient visit   Cheyenne Arias [id number] 39 y.o.  12/13/2014 1:56 PM  Chief Complaint:  Anxiety, depression. History of Present Illness:   Patient Presents for follow-up and medication management for major depressive disorder. Panic disorder. PTSD.  Patient is a single African-American female diagnosed with multiple medical conditions including chronic back pain and migraines asthma. She has 3 kids 2 of which are living with her 24 year old daughter and a 76-year-old son.   As per initial evaluation " Patient has been diagnosed with depression and says possible bipolar by her [facility]. Says that she was a no-show one or 2 times and could not afford the no-show fee so she is transferring services that says that she has been doing reasonably regarding for depression with the current medication regimen. Says that she is on Abilify 2 mg for night terrors and depression. She is also on Klonopin 0.5 mg up to 2 times a day. Ambien 5 mg. Prozac 20 mg. Says that she goes to bed at 7 PM and then wakes up around 2 AM. I cautioned not to use the Klonopin and Ambien together and use her C Pap machine regularly and follow with a sleep specialist in regarding to her chronic sleep issues"  Patient recently has gone through frequent hospital visits and also diagnosed with congestive heart failure possibly due to anemia or other causes. She has had leg swelling now she has lost weight because of diagnosis and her swelling is improved In regarding to her depression. Still feels depressed and overwhelmed at times cause of her son who has been charged with armed robbery. He is in jail and does not know how long would be be convicted for this keeps her upset and down. Takes prozac  Panic condition; ongoing related with stress. Takes klonopine  twice a day. Anxiety has been worse and because she has been running low on her medication and has not been taking it twice a day Night terrors: takes abilify it helps otherwise she screams and wakes up. It also helps mood symtpoms.. Medical complexity/Data: has OSA uses cpap. Which helps her sleep. Has fibromyalgia which complicates her depression. She continues with topamax and lyrica.  Severity of depression are scheduled and is 5 out of 10. 10 being very happy.   Aggravating factors include her medical conditions and taking care of the kids she has been on a wheelchair in the past because of fibromyalgia and her pain condition includes chronic back and neck pain. She also has a possibility of pituitary tumor that is being assessed  Modifying factors; some support and home health care    Past Psychiatric History/Hospitalization(s) Denies being hospitalized in psychiatric facility. Most of her treatment has been outpatient psychiatrist follow-up [facility]  Prior Suicide Attempts: No  Medical History; Past Medical History  Diagnosis Date  . Hypertension   . Migraine   . Anxiety   . Abnormal Pap smear   . Anemia   . Fibroids   . Fibromyalgia     degenerative disc disease  . Depression   . Sleep apnea   . Shortness of breath   . On home oxygen therapy     2 liters [place]  . PONV (postoperative nausea and vomiting)   . Low iron     pt  states low iron-for Feraheme today in [facility]U  . Morbid obesity with BMI of 45.0-49.9, adult   . Iron deficiency anemia 04/24/2012  . Microcytic anemia 11/04/2013  . Unspecified deficiency anemia 11/04/2013  . Alpha thalassemia   . Asthma     recent admission with exacerbation of asthma 09/15/11    Allergies: Allergies  Allergen Reactions  . Latex Itching and Swelling    Blisters  . Morphine And Related Itching    Medications: Outpatient Encounter Prescriptions as of 12/13/2014  Medication Sig  .  amLODipine (NORVASC) 10 MG tablet Take 10 mg by mouth daily.  . ARIPiprazole (ABILIFY) 2 MG tablet Take 1 tablet (2 mg total) by mouth daily.  . clonazePAM (KLONOPIN) 0.5 MG tablet Take 1 tablet (0.5 mg total) by mouth 2 (two) times daily as needed for anxiety.  . cyclobenzaprine (FLEXERIL) 10 MG tablet Take 10 mg by mouth 3 (three) times daily as needed. For muscle spasms  . ferumoxytol (FERAHEME) 510 MG/17ML SOLN injection Inject 510 mg into the vein every 3 (three) months.  [name] FLUoxetine (PROZAC) 20 MG tablet Please give Capsules of prozac instead of tablets. She tolerates capsules better  . fluticasone (FLONASE) 50 MCG/ACT nasal spray PLACE 2 SPRAYS INTO BOTH NOSTRILS 2 (TWO) TIMES DAILY.  . furosemide (LASIX) 40 MG tablet Take 1 tablet (40 mg total) by mouth daily.  [name] ibuprofen (ADVIL,MOTRIN) 800 MG tablet Take 800 mg by mouth 3 (three) times daily.  [name] ibuprofen (ADVIL,MOTRIN) 800 MG tablet Take 1 tablet (800 mg total) by mouth 3 (three) times daily.  [name] ipratropium-albuterol (DUONEB) 0.5-2.5 (3) MG/3ML SOLN TAKE 3 MLS BY NEBULIZATION EVERY 4 (FOUR) HOURS AS NEEDED (FOR WHEEZING).  . Lactulose 20 GM/30ML SOLN Take 15-30 mLs by mouth daily.  [name] loratadine (CLARITIN) 10 MG tablet TAKE 1 TABLET (10 MG TOTAL) BY MOUTH DAILY.  . metoprolol tartrate (LOPRESSOR) 25 MG tablet Take 1 tablet (25 mg total) by mouth 2 (two) times daily.  . montelukast (SINGULAIR) 10 MG tablet TAKE 1 TABLET (10 MG TOTAL) BY MOUTH AT BEDTIME.  . Multiple Vitamins-Minerals (MULTIVITAMIN WITH MINERALS) tablet Take 1 tablet by mouth daily.  . ondansetron (ZOFRAN ODT) 4 MG disintegrating tablet 4mg  ODT q4 hours prn nausea/vomit  . Oxycodone HCl 10 MG TABS Take 10 mg by mouth 3 (three) times daily.  . OXYGEN-HELIUM IN by Other route. Uses 2 Liters of Oxygen Continuously  . pantoprazole (PROTONIX) 40 MG tablet TAKE 1 TABLET (40 MG TOTAL) BY MOUTH DAILY.  [name] potassium chloride SA (K-DUR,KLOR-CON) 20 MEQ tablet Take 20 mEq by mouth 2  (two) times daily.  . pregabalin (LYRICA) 75 MG capsule Take 75 mg by mouth 2 (two) times daily.  [name] PROAIR HFA 108 (90 BASE) MCG/ACT inhaler INHALE 2 PUFFS INTO THE LUNGS EVERY 6 (SIX) HOURS AS NEEDED FOR WHEEZING OR SHORTNESS OF BREATH.  [name] PULMICORT 0.5 MG/2ML nebulizer solution USE 1 VIAL VIA NEBULIZER TWICE DAILY  . rizatriptan (MAXALT) 10 MG tablet Take 1 tablet (10 mg total) by mouth daily as needed. May repeat in 2 hours if needed  . topiramate (TOPAMAX) 100 MG tablet bid  . [DISCONTINUED] ARIPiprazole (ABILIFY) 2 MG tablet Take 1 tablet (2 mg total) by mouth daily.  . [DISCONTINUED] clonazePAM (KLONOPIN) 0.5 MG tablet Take 1 tablet (0.5 mg total) by mouth 2 (two) times daily as needed for anxiety.  . [DISCONTINUED] FLUoxetine (PROZAC) 20 MG tablet Please give Capsules of prozac instead of tablets. She tolerates capsules better  No facility-administered encounter medications on file as of 12/13/2014.     Family History; Family History  Problem Relation Age of Onset  . Sickle cell trait Maternal Aunt   . Sickle cell anemia Other   . Schizophrenia Son   . Depression Daughter        Labs:  Recent Results (from the past 2160 hour(s))  GC/Chlamydia probe amp ([place])not at [facility]     Status: None   Collection Time: 11/12/14 12:00 AM  Result Value Ref Range   Chlamydia Negative     Comment: Normal Reference Range - Negative   Neisseria gonorrhea Negative     Comment: Normal Reference Range - Negative  Urinalysis, Routine w reflex microscopic (not at [facility])     Status: Abnormal   Collection Time: 11/12/14  9:00 PM  Result Value Ref Range   Color, Urine YELLOW YELLOW   APPearance CLOUDY (A) CLEAR   Specific Gravity, Urine 1.007 1.005 - 1.030   pH 7.0 5.0 - 8.0   Glucose, UA NEGATIVE NEGATIVE mg/dL   Hgb urine dipstick NEGATIVE NEGATIVE   Bilirubin Urine NEGATIVE NEGATIVE   Ketones, ur NEGATIVE NEGATIVE mg/dL   Protein, ur NEGATIVE NEGATIVE mg/dL   Urobilinogen, UA 1.0  0.0 - 1.0 mg/dL   Nitrite NEGATIVE NEGATIVE   Leukocytes, UA SMALL (A) NEGATIVE  Pregnancy, urine     Status: None   Collection Time: 11/12/14  9:00 PM  Result Value Ref Range   Preg Test, Ur NEGATIVE NEGATIVE    Comment:        THE SENSITIVITY OF THIS METHODOLOGY IS >20 mIU/mL.   Urine microscopic-add on     Status: Abnormal   Collection Time: 11/12/14  9:00 PM  Result Value Ref Range   Squamous Epithelial / LPF FEW (A) RARE   WBC, UA 3-6 <3 WBC/hpf   RBC / HPF 0-2 <3 RBC/hpf   Bacteria, UA MANY (A) RARE  CBC with Differential     Status: Abnormal   Collection Time: 11/12/14  9:30 PM  Result Value Ref Range   WBC 11.1 (H) 4.0 - 10.5 K/uL   RBC 4.68 3.87 - 5.11 MIL/uL   Hemoglobin 11.4 (L) 12.0 - 15.0 g/dL   HCT 36.5 36.0 - 46.0 %   MCV 78.0 78.0 - 100.0 fL   MCH 24.4 (L) 26.0 - 34.0 pg   MCHC 31.2 30.0 - 36.0 g/dL   RDW 17.2 (H) 11.5 - 15.5 %   Platelets 359 150 - 400 K/uL   Neutrophils Relative % 69 43 - 77 %   Neutro Abs 7.7 1.7 - 7.7 K/uL   Lymphocytes Relative 21 12 - 46 %   Lymphs Abs 2.3 0.7 - 4.0 K/uL   Monocytes Relative 7 3 - 12 %   Monocytes Absolute 0.7 0.1 - 1.0 K/uL   Eosinophils Relative 3 0 - 5 %   Eosinophils Absolute 0.3 0.0 - 0.7 K/uL   Basophils Relative 0 0 - 1 %   Basophils Absolute 0.0 0.0 - 0.1 K/uL  Basic metabolic panel     Status: Abnormal   Collection Time: 11/12/14  9:30 PM  Result Value Ref Range   Sodium 138 135 - 145 mmol/L   Potassium 3.0 (L) 3.5 - 5.1 mmol/L   Chloride 104 101 - 111 mmol/L   CO2 23 22 - 32 mmol/L   Glucose, Bld 105 (H) 65 - 99 mg/dL   BUN 12 6 - 20 mg/dL   Creatinine,  Ser 0.83 0.44 - 1.00 mg/dL   Calcium 9.3 8.9 - 10.3 mg/dL   GFR calc non Af Amer >60 >60 mL/min   GFR calc Af Amer >60 >60 mL/min    Comment: (NOTE) The eGFR has been calculated using the CKD EPI equation. This calculation has not been validated in all clinical situations. eGFR's persistently <60 mL/min signify possible Chronic Kidney Disease.     Anion gap 11 5 - 15  Wet prep, genital     Status: Abnormal   Collection Time: 11/12/14 11:40 PM  Result Value Ref Range   Yeast Wet Prep HPF POC NONE SEEN NONE SEEN   Trich, Wet Prep NONE SEEN NONE SEEN   Clue Cells Wet Prep HPF POC FEW (A) NONE SEEN   WBC, Wet Prep HPF POC MODERATE (A) NONE SEEN       Musculoskeletal: Strength & Muscle Tone: within normal limits Gait & Station: normal Patient leans: N/A  Mental Status Examination;   Psychiatric Specialty Exam: Physical Exam  Constitutional: She appears well-nourished.  Skin: She is not diaphoretic.    Review of Systems  Constitutional: Positive for malaise/fatigue. Negative for fever.  Gastrointestinal: Negative for nausea.  Skin: Negative for rash.  Psychiatric/Behavioral: Negative for suicidal ideas. The patient is nervous/anxious.     Blood pressure 126/80, pulse 114, height $RemoveBef'5\' 2"'UpguCOLbmQ$  (1.575 m), weight 288 lb (130.636 kg), SpO2 98 %.Body mass index is 52.66 kg/(m^2).  General Appearance: Casual  Eye Contact::  Fair  Speech:  Slow  Volume:  Normal  Mood:  Dysphoric  Affect:  Congruent  Thought Process:  Coherent  Orientation:  Full (Time, Place, and Person)  Thought Content:  Rumination  Suicidal Thoughts:  No  Homicidal Thoughts:  No  Memory:  Immediate;   Fair Recent;   Fair  Judgement:  Fair  Insight:  Fair  Psychomotor Activity:  Normal  Concentration:  Fair  Recall:  Fair  Akathisia:  Negative  Handed:  Right  AIMS (if indicated):     Assets:  Communication Skills Desire for Improvement Financial Resources/Insurance Housing  Sleep:        Assessment: Axis I: Major depressive disorder recurrent moderate. Panic disorder. Mood disorder otherwise nonspecified. Possible related  secondary to general medical condition including chronic iback pain  Axis II: Deferred   Axis III:  Past Medical History  Diagnosis Date  . Hypertension   . Migraine   . Anxiety   . Abnormal Pap smear   . Anemia    . Fibroids   . Fibromyalgia     degenerative disc disease  . Depression   . Sleep apnea   . Shortness of breath   . On home oxygen therapy     2 liters [place]  . PONV (postoperative nausea and vomiting)   . Low iron     pt states low iron-for Feraheme today in [facility]U  . Morbid obesity with BMI of 45.0-49.9, adult   . Iron deficiency anemia 04/24/2012  . Microcytic anemia 11/04/2013  . Unspecified deficiency anemia 11/04/2013  . Alpha thalassemia   . Asthma     recent admission with exacerbation of asthma 09/15/11    Axis IV: Psychosocial, single mom, multiple medical conditions.   Treatment Plan and Summary:  Depression: continue prozac and also for anxiety and possible PTSD  She is not interested in counseling.  Says too much going on not sure if she can make up appointments.   Panic disorder; klonopine continue but avoid increaing  dose  Abilify 2 mg for mood and night terrors and mood symptoms. . Refills written. She is also on Topamax and Lyrica which may also be helping the mood symptoms.  Pertinent Labs and Relevant Prior Notes reviewed. Medication Side effects, benefits and risks reviewed/discussed with Patient. Time given for patient to respond and asks questions regarding the Diagnosis and Medications. Safety concerns and to report to ER if suicidal or call 911. Relevant Medications refilled or called in to pharmacy. Discussed weight maintenance and Sleep Hygiene. Follow up with Primary care provider in regards to Medical conditions. Recommend compliance with medications and follow up office appointments. Discussed to avail opportunity to consider or/and continue Individual therapy with Counselor. Greater than 50% of time was spend in counseling and coordination of care with the patient.  Schedule for Follow up visit in 1 month or call in earlier as necessary.   Merian Capron, MD 12/13/2014

## 2015-01-04 DIAGNOSIS — G43909 Migraine, unspecified, not intractable, without status migrainosus: Secondary | ICD-10-CM | POA: Insufficient documentation

## 2015-01-04 DIAGNOSIS — F3132 Bipolar disorder, current episode depressed, moderate: Secondary | ICD-10-CM | POA: Insufficient documentation

## 2015-01-04 DIAGNOSIS — Z6841 Body Mass Index (BMI) 40.0 and over, adult: Secondary | ICD-10-CM | POA: Insufficient documentation

## 2015-01-04 DIAGNOSIS — Z9981 Dependence on supplemental oxygen: Secondary | ICD-10-CM | POA: Insufficient documentation

## 2015-01-04 DIAGNOSIS — F319 Bipolar disorder, unspecified: Secondary | ICD-10-CM | POA: Insufficient documentation

## 2015-01-04 DIAGNOSIS — G609 Hereditary and idiopathic neuropathy, unspecified: Secondary | ICD-10-CM | POA: Insufficient documentation

## 2015-01-06 DIAGNOSIS — R7303 Prediabetes: Secondary | ICD-10-CM | POA: Insufficient documentation

## 2015-01-15 ENCOUNTER — Other Ambulatory Visit: Payer: Self-pay | Admitting: Pulmonary Disease

## 2015-01-17 ENCOUNTER — Telehealth: Payer: Self-pay | Admitting: Pulmonary Disease

## 2015-01-17 NOTE — Telephone Encounter (Signed)
Called and spoke to pharmacy tech, she ran the [employer] med with the 'persistent asthma' dx code, it ran through and is able to process. Called and informed pt the issue with her neb med has been taken care of. Pt verbalized understanding and denied any further questions or concerns at this time.

## 2015-01-25 ENCOUNTER — Ambulatory Visit (INDEPENDENT_AMBULATORY_CARE_PROVIDER_SITE_OTHER): Payer: Medicare Other | Admitting: Pulmonary Disease

## 2015-01-25 ENCOUNTER — Encounter: Payer: Self-pay | Admitting: Pulmonary Disease

## 2015-01-25 VITALS — BP 118/82 | HR 95 | Temp 98.2°F | Ht 62.0 in | Wt 298.0 lb

## 2015-01-25 DIAGNOSIS — J019 Acute sinusitis, unspecified: Secondary | ICD-10-CM

## 2015-01-25 DIAGNOSIS — J45998 Other asthma: Secondary | ICD-10-CM

## 2015-01-25 DIAGNOSIS — G4733 Obstructive sleep apnea (adult) (pediatric): Secondary | ICD-10-CM | POA: Diagnosis not present

## 2015-01-25 DIAGNOSIS — IMO0002 Reserved for concepts with insufficient information to code with codable children: Secondary | ICD-10-CM

## 2015-01-25 DIAGNOSIS — J9611 Chronic respiratory failure with hypoxia: Secondary | ICD-10-CM | POA: Diagnosis not present

## 2015-01-25 DIAGNOSIS — E662 Morbid (severe) obesity with alveolar hypoventilation: Secondary | ICD-10-CM

## 2015-01-25 MED ORDER — AMOXICILLIN 500 MG PO CAPS: 500.0000 mg | ORAL_CAPSULE | Freq: Three times a day (TID) | ORAL | Status: DC

## 2015-01-25 NOTE — Patient Instructions (Signed)
Amoxicillin 500 mg three times per day  Follow up in 6 months

## 2015-01-25 NOTE — Progress Notes (Signed)
Chief Complaint  Patient presents with  . Follow-up    pt states she is doing ok. pt states she may have a sinus infection going on, pt has nose conjestion and drainage with a dry cough. pt using CPAP with O2 at 2LPM every night for about 8 hours a night. mask  and pressure good for pt. no download available. DME: [facility]     History of Present Illness: Cheyenne Arias is a 39 y.o. female with asthma, OSA, OHS.  She has been doing okay until the last 2 to 3 weeks.  She has developed sinus congestion and pressure.  This has been getting progressively worse.  She is getting sinus drainage with clear to yellow drainage.  Her throat has felt scratchy.  She is not aware of having fever.  She has been using nasal irrigation, flonase, claritin, and singulair.  She uses pulmicort BID.  She has not been using duoneb much.  She has not been on prednisone recently.  She is using CPAP at night with 2 liters oxygen.  She no longer uses oxygen during the day.  She gets her supplies through [facility].  Tests: PSG 03/27/11 >> AHI 10.9 Echo 09/20/11 > >mod LVH, EF 65 to 70%, small/mod pericardial effusion  Doppler legs 09/21/11 >> no DVT CT chest 09/22/11 >> no PE, Lt base ATX/ASD, borderline cardiomegaly Spirometry 01/10/12 >> FEV1 1.72 (71%), FEV1% 77 CT sinus 02/28/12 >> clear paranasal sinuses RAST 02/28/12 >> IgE 15.6, moderate reaction to [facility] CT chest 04/03/12 >> ASD superior segment LLL, RLL ATX PFT 12/17/12 >> FEV1 2.14 (95%), FEV1% 88, TLC 2.62 (58%), DLCO 96%, no BD CPAP 08/25/14 to 09/23/14 >> used on 28 of 30 nights with average 3 hrs and 54 min.  Average AHI is 0.7 with CPAP 12 cm H2O.  PMHx >> HTN, HA, Anxiety, Fibromyalgia, Depression, Alpha thalassemia, Iron deficiency anemia  PSHx, Medications, Allergies, Fhx, Shx reviewed.   Physical Exam: BP 118/82 mmHg  Pulse 95  Temp(Src) 98.2 F (36.8 C) (Oral)  Ht 5\' 2"  (1.575 m)  Wt 298 lb (135.172 kg)  BMI 54.49 kg/m2  SpO2 100%  General -  pleasant HEENT - frontal sinus tenderness, clear to yellow sinus drainage, MP 2, 3+ tonsil Cardiac - s1s2 regular, no murmur Chest - no wheeze/rales Abd - obese, soft, non-tender Ext - no edema Neuro - normal strength Psych - normal mood behavior   Assessment/Plan:  Acute sinusitis. She has not improved after several weeks of conservative therapy. Plan: - will give her course of amoxicillin - she is to continue nasal irrigation, flonase, claritin, and singulair  Persistent asthma. Plan: - continue pumlicort, singulair, and prn albuterol/duoneb  Obstructive sleep apnea. She reports compliance with CPAP. Plan: - continue CPAP 12 cm H2O  Chronic respiratory failure with hypoxia 2nd to obesity hypoventilation syndrome. Plan: - continue supplemental oxygen at night  Morbid obesity. Plan: - discussed importance of weight loss   Chesley Mires, MD [facility] Pager:  [phone] 01/25/2015, 12:32 PM

## 2015-02-07 ENCOUNTER — Ambulatory Visit (HOSPITAL_COMMUNITY): Payer: Self-pay | Admitting: Psychiatry

## 2015-02-09 ENCOUNTER — Telehealth (HOSPITAL_COMMUNITY): Payer: Self-pay | Admitting: *Deleted

## 2015-02-09 ENCOUNTER — Other Ambulatory Visit: Payer: Self-pay

## 2015-02-09 DIAGNOSIS — Z1231 Encounter for screening mammogram for malignant neoplasm of breast: Secondary | ICD-10-CM

## 2015-02-09 MED ORDER — FLUOXETINE HCL 20 MG PO TABS: ORAL_TABLET | ORAL | Status: DC

## 2015-02-09 MED ORDER — ARIPIPRAZOLE 2 MG PO TABS: 2.0000 mg | ORAL_TABLET | Freq: Every day | ORAL | Status: DC

## 2015-02-09 MED ORDER — CLONAZEPAM 0.5 MG PO TABS: 0.5000 mg | ORAL_TABLET | Freq: Two times a day (BID) | ORAL | Status: DC | PRN

## 2015-02-09 NOTE — Telephone Encounter (Signed)
Pt called for refill Abilify 2mg , Klonopin 0.5mg  and Prozac 20mg . Per Dr. De Nurse, pt is authorized for refills Abilify 2mg , #30 and Prozac 20mg , #30. Prescriptions was sent to pharmacy. Prescription for Klonopin 0.5mg , #60 was phone into [facility] Pt has a f/u appt on 03/08/15. Called and informed pt of prescription status. Pt verbalizes understanding.

## 2015-02-17 ENCOUNTER — Encounter: Payer: Self-pay | Admitting: *Deleted

## 2015-03-04 ENCOUNTER — Other Ambulatory Visit: Payer: Self-pay | Admitting: Pulmonary Disease

## 2015-03-04 ENCOUNTER — Other Ambulatory Visit (HOSPITAL_COMMUNITY): Payer: Self-pay | Admitting: Psychiatry

## 2015-03-04 ENCOUNTER — Other Ambulatory Visit: Payer: Self-pay | Admitting: Hematology and Oncology

## 2015-03-08 ENCOUNTER — Ambulatory Visit (HOSPITAL_COMMUNITY): Payer: Medicare Other | Admitting: Psychiatry

## 2015-03-15 ENCOUNTER — Other Ambulatory Visit (HOSPITAL_COMMUNITY): Payer: Self-pay | Admitting: Psychiatry

## 2015-03-15 MED ORDER — ARIPIPRAZOLE 2 MG PO TABS: 2.0000 mg | ORAL_TABLET | Freq: Every day | ORAL | Status: DC

## 2015-03-15 NOTE — Telephone Encounter (Signed)
Pt called for refills for Abilify 2mg , Klonopin 0.5mg , and Prozac 20mg . Per Dr. De Nurse, pt is authorized for refills for Abilify 2mg , #30 and Prozac 20mg ,#30. Pre Dr. De Nurse, please phone in prescription for Klonopin 0.5mg , #60 to [facility]. Spoke with Estill Bamberg at [facility]. Per Estill Bamberg, pharmacy will notify pt once prescription is ready. Pt has a f/u appt on 04/19/15. Called and informed pt of prescription status. Pt verbalizes understanding.

## 2015-03-16 ENCOUNTER — Ambulatory Visit
Admission: RE | Admit: 2015-03-16 | Discharge: 2015-03-16 | Disposition: A | Discharge status: Home / Self Care | Payer: Medicare Other | Source: Ambulatory Visit

## 2015-03-16 DIAGNOSIS — Z1231 Encounter for screening mammogram for malignant neoplasm of breast: Secondary | ICD-10-CM

## 2015-03-16 NOTE — Telephone Encounter (Signed)
Received medication request from [facility] for Abilify 2mg , #30. Per Dr. De Nurse, medication request is denied. Rx was sent to pharmacy on 03/15/15. Pt has a f/up appt on 04/19/15. Called and informed pt of Rx status. Pt verbalizes understanding.

## 2015-03-17 ENCOUNTER — Ambulatory Visit (HOSPITAL_COMMUNITY): Payer: Medicare Other | Admitting: Psychiatry

## 2015-03-19 ENCOUNTER — Encounter (HOSPITAL_COMMUNITY): Payer: Self-pay | Admitting: *Deleted

## 2015-03-19 ENCOUNTER — Inpatient Hospital Stay (HOSPITAL_COMMUNITY)
Admission: AD | Admit: 2015-03-19 | Discharge: 2015-03-19 | Disposition: A | Discharge status: Home / Self Care | Payer: Medicare Other | Source: Ambulatory Visit | Attending: Obstetrics & Gynecology | Admitting: Obstetrics & Gynecology

## 2015-03-19 DIAGNOSIS — N390 Urinary tract infection, site not specified: Secondary | ICD-10-CM

## 2015-03-19 DIAGNOSIS — R109 Unspecified abdominal pain: Secondary | ICD-10-CM | POA: Insufficient documentation

## 2015-03-19 DIAGNOSIS — M797 Fibromyalgia: Secondary | ICD-10-CM | POA: Insufficient documentation

## 2015-03-19 DIAGNOSIS — R110 Nausea: Secondary | ICD-10-CM | POA: Insufficient documentation

## 2015-03-19 DIAGNOSIS — D259 Leiomyoma of uterus, unspecified: Secondary | ICD-10-CM | POA: Diagnosis not present

## 2015-03-19 LAB — URINE MICROSCOPIC-ADD ON

## 2015-03-19 LAB — URINALYSIS, ROUTINE W REFLEX MICROSCOPIC
Bilirubin Urine: NEGATIVE
Glucose, UA: NEGATIVE mg/dL
Hgb urine dipstick: NEGATIVE
KETONES UR: NEGATIVE mg/dL
NITRITE: NEGATIVE
PROTEIN: NEGATIVE mg/dL
Specific Gravity, Urine: 1.015 (ref 1.005–1.030)
pH: 6.5 (ref 5.0–8.0)

## 2015-03-19 LAB — WET PREP, GENITAL
Clue Cells Wet Prep HPF POC: NONE SEEN
Sperm: NONE SEEN
Trich, Wet Prep: NONE SEEN
Yeast Wet Prep HPF POC: NONE SEEN

## 2015-03-19 LAB — POCT PREGNANCY, URINE: PREG TEST UR: NEGATIVE

## 2015-03-19 MED ORDER — ONDANSETRON HCL 4 MG PO TABS
8.0000 mg | ORAL_TABLET | Freq: Once | ORAL | Status: AC
  Administered 2015-03-19: 8 mg via ORAL
  Filled 2015-03-19: qty 2

## 2015-03-19 MED ORDER — FLUCONAZOLE 150 MG PO TABS: 150.0000 mg | ORAL_TABLET | Freq: Every day | ORAL | Status: DC

## 2015-03-19 MED ORDER — NITROFURANTOIN MONOHYD MACRO 100 MG PO CAPS: 100.0000 mg | ORAL_CAPSULE | Freq: Two times a day (BID) | ORAL | Status: AC

## 2015-03-19 MED ORDER — ONDANSETRON HCL 8 MG PO TABS: 8.0000 mg | ORAL_TABLET | Freq: Three times a day (TID) | ORAL | Status: DC | PRN

## 2015-03-19 NOTE — MAU Provider Note (Signed)
History     CSN: [id number]  Arrival date and time: 03/19/15 [id number]   First Provider Initiated Contact with Patient 03/19/15 1830      Chief Complaint  Patient presents with  . Abdominal Pain  . Nausea   HPI  Cheyenne Arias 39 y.o. [id number] presents to the MAU stating that she has had lower abdominal cramping for about 2 weeks accompanied by nausea. She states she has a history of fibromyalgia and fibroids. She has generally heavy painful periods.She states she takes flexeril 3 times a day and ibuprofen daily.  Past Medical History  Diagnosis Date  . Hypertension   . Migraine   . Anxiety   . Abnormal Pap smear   . Anemia   . Fibroids   . Fibromyalgia     degenerative disc disease  . Depression   . Sleep apnea   . Shortness of breath   . On home oxygen therapy     2 liters [place]  . PONV (postoperative nausea and vomiting)   . Low iron     pt states low iron-for Feraheme today in MAU  . Morbid obesity with BMI of 45.0-49.9, adult ([place])   . Iron deficiency anemia 04/24/2012  . Microcytic anemia 11/04/2013  . Unspecified deficiency anemia 11/04/2013  . Alpha thalassemia ([place])   . Asthma     recent admission with exacerbation of asthma 09/15/11    Past Surgical History  Procedure Laterality Date  . Wisdom tooth extraction    . Mandible surgery  2008  . Carpal tunnel release    . Cesarean section      1994, 1996   . Cesarean section  11/28/2011    Procedure: CESAREAN SECTION;  Surgeon: Woodroe Mode, MD;  Location: [facility];  Service: Obstetrics;  Laterality: N/A;    Family History  Problem Relation Age of Onset  . Sickle cell trait Maternal Aunt   . Sickle cell anemia Other   . Schizophrenia Son   . Depression Daughter     Social History  Substance Use Topics  . Smoking status: Never Smoker   . Smokeless tobacco: Never Used     Comment: Pt was exposed to 2nd hand smoke at work years ago.  . Alcohol Use: No    Allergies:  Allergies  Allergen Reactions  .  Latex Itching and Swelling    Blisters  . Morphine And Related Itching    Prescriptions prior to admission  Medication Sig Dispense Refill Last Dose  . amLODipine (NORVASC) 10 MG tablet Take 10 mg by mouth daily.   Taking  . amoxicillin (AMOXIL) 500 MG capsule Take 1 capsule (500 mg total) by mouth 3 (three) times daily. 21 capsule 0   . ARIPiprazole (ABILIFY) 2 MG tablet Take 1 tablet (2 mg total) by mouth daily. 30 tablet 0   . clonazePAM (KLONOPIN) 0.5 MG tablet TAKE 1 TABLET BY MOUTH TWICE A DAY AS NEEDED FOR ANXIETY 60 tablet 0   . cyclobenzaprine (FLEXERIL) 10 MG tablet Take 10 mg by mouth 3 (three) times daily as needed. For muscle spasms   Taking  . ferumoxytol (FERAHEME) 510 MG/17ML SOLN injection Inject 510 mg into the vein every 3 (three) months.   Taking  . FLUoxetine (PROZAC) 20 MG capsule TAKE ONE CAPSULE BY MOUTH EVERY DAY 30 capsule 0   . fluticasone (FLONASE) 50 MCG/ACT nasal spray PLACE 2 SPRAYS INTO BOTH NOSTRILS 2 (TWO) TIMES DAILY. 16 g 5 Taking  . furosemide (  LASIX) 40 MG tablet Take 1 tablet (40 mg total) by mouth daily. 10 tablet 1 Taking  . furosemide (LASIX) 80 MG tablet Take 80-160 mg by mouth.   Taking  . ibuprofen (ADVIL,MOTRIN) 800 MG tablet Take 800 mg by mouth 3 (three) times daily.   Taking  . ibuprofen (ADVIL,MOTRIN) 800 MG tablet Take 1 tablet (800 mg total) by mouth 3 (three) times daily. 21 tablet 0 Taking  . ipratropium-albuterol (DUONEB) 0.5-2.5 (3) MG/3ML SOLN TAKE 3 MLS BY NEBULIZATION EVERY 4 (FOUR) HOURS AS NEEDED (FOR WHEEZING). 360 mL 5 Taking  . Lactulose 20 GM/30ML SOLN Take 15-30 mLs by mouth daily.   Taking  . loratadine (CLARITIN) 10 MG tablet TAKE 1 TABLET (10 MG TOTAL) BY MOUTH DAILY. 31 tablet 2 Taking  . metoprolol tartrate (LOPRESSOR) 25 MG tablet Take 1 tablet (25 mg total) by mouth 2 (two) times daily. 360 tablet 6 Taking  . metoprolol tartrate (LOPRESSOR) 25 MG tablet Take 25 mg by mouth.   Taking  . montelukast (SINGULAIR) 10 MG  tablet TAKE 1 TABLET (10 MG TOTAL) BY MOUTH AT BEDTIME. 30 tablet 2 Taking  . Multiple Vitamins-Minerals (MULTIVITAMIN WITH MINERALS) tablet Take 1 tablet by mouth daily.   Taking  . ondansetron (ZOFRAN ODT) 4 MG disintegrating tablet 4mg  ODT q4 hours prn nausea/vomit 10 tablet 0 Taking  . Oxycodone HCl 10 MG TABS Take 10 mg by mouth 3 (three) times daily.   Taking  . OXYGEN-HELIUM IN by Other route. Uses 2 Liters of Oxygen Continuously   Taking  . pantoprazole (PROTONIX) 40 MG tablet TAKE 1 TABLET (40 MG TOTAL) BY MOUTH DAILY. 30 tablet 3 Taking  . potassium chloride SA (K-DUR,KLOR-CON) 20 MEQ tablet Take 20 mEq by mouth 2 (two) times daily.   Taking  . pregabalin (LYRICA) 75 MG capsule Take 75 mg by mouth 2 (two) times daily.   Taking  . PROAIR HFA 108 (90 BASE) MCG/ACT inhaler INHALE 2 PUFFS INTO THE LUNGS EVERY 6 (SIX) HOURS AS NEEDED FOR WHEEZING OR SHORTNESS OF BREATH. 8.5 Inhaler 0   . PULMICORT 0.5 MG/2ML nebulizer solution USE 1 VIAL VIA NEBULIZER TWICE DAILY 120 mL 5 Taking  . rizatriptan (MAXALT) 10 MG tablet Take 1 tablet (10 mg total) by mouth daily as needed. May repeat in 2 hours if needed 15 tablet 11 Taking  . topiramate (TOPAMAX) 100 MG tablet bid 60 tablet 11 Taking    Review of Systems  Gastrointestinal: Positive for nausea and abdominal pain.  All other systems reviewed and are negative.  Physical Exam   Blood pressure 120/52, pulse 114, temperature 98 F (36.7 C), temperature source Oral, resp. rate 18, height 5\' 2"  (1.575 m), weight 137.893 kg (304 lb), last menstrual period 02/26/2015.  Physical Exam  Nursing note and vitals reviewed. Constitutional: She is oriented to person, place, and time. She appears well-developed and well-nourished. No distress.  HENT:  Head: Normocephalic and atraumatic.  Cardiovascular: Normal rate.   Respiratory: Effort normal. No respiratory distress.  GI: Soft. There is tenderness.  lower abdominal; Habitus interferes with exam   Genitourinary: Vagina normal. No vaginal discharge found.  Musculoskeletal: Normal range of motion.  Neurological: She is alert and oriented to person, place, and time.  Skin: Skin is warm and dry.  Psychiatric: She has a normal mood and affect. Her behavior is normal. Judgment and thought content normal.   Results for orders placed or performed during the hospital encounter of 03/19/15 (from the past  24 hour(s))  Urinalysis, Routine w reflex microscopic (not at [facility])     Status: Abnormal   Collection Time: 03/19/15  5:47 PM  Result Value Ref Range   Color, Urine YELLOW YELLOW   APPearance CLEAR CLEAR   Specific Gravity, Urine 1.015 1.005 - 1.030   pH 6.5 5.0 - 8.0   Glucose, UA NEGATIVE NEGATIVE mg/dL   Hgb urine dipstick NEGATIVE NEGATIVE   Bilirubin Urine NEGATIVE NEGATIVE   Ketones, ur NEGATIVE NEGATIVE mg/dL   Protein, ur NEGATIVE NEGATIVE mg/dL   Nitrite NEGATIVE NEGATIVE   Leukocytes, UA MODERATE (A) NEGATIVE  Urine microscopic-add on     Status: Abnormal   Collection Time: 03/19/15  5:47 PM  Result Value Ref Range   Squamous Epithelial / LPF 0-5 (A) NONE SEEN   WBC, UA 6-30 0 - 5 WBC/hpf   RBC / HPF 0-5 0 - 5 RBC/hpf   Bacteria, UA MANY (A) NONE SEEN  Pregnancy, urine POC     Status: None   Collection Time: 03/19/15  6:26 PM  Result Value Ref Range   Preg Test, Ur NEGATIVE NEGATIVE   Results for orders placed or performed during the hospital encounter of 03/19/15 (from the past 24 hour(s))  Urinalysis, Routine w reflex microscopic (not at [facility])     Status: Abnormal   Collection Time: 03/19/15  5:47 PM  Result Value Ref Range   Color, Urine YELLOW YELLOW   APPearance CLEAR CLEAR   Specific Gravity, Urine 1.015 1.005 - 1.030   pH 6.5 5.0 - 8.0   Glucose, UA NEGATIVE NEGATIVE mg/dL   Hgb urine dipstick NEGATIVE NEGATIVE   Bilirubin Urine NEGATIVE NEGATIVE   Ketones, ur NEGATIVE NEGATIVE mg/dL   Protein, ur NEGATIVE NEGATIVE mg/dL   Nitrite NEGATIVE NEGATIVE    Leukocytes, UA MODERATE (A) NEGATIVE  Urine microscopic-add on     Status: Abnormal   Collection Time: 03/19/15  5:47 PM  Result Value Ref Range   Squamous Epithelial / LPF 0-5 (A) NONE SEEN   WBC, UA 6-30 0 - 5 WBC/hpf   RBC / HPF 0-5 0 - 5 RBC/hpf   Bacteria, UA MANY (A) NONE SEEN  Pregnancy, urine POC     Status: None   Collection Time: 03/19/15  6:26 PM  Result Value Ref Range   Preg Test, Ur NEGATIVE NEGATIVE  Wet prep, genital     Status: Abnormal   Collection Time: 03/19/15  6:57 PM  Result Value Ref Range   Yeast Wet Prep HPF POC NONE SEEN NONE SEEN   Trich, Wet Prep NONE SEEN NONE SEEN   Clue Cells Wet Prep HPF POC NONE SEEN NONE SEEN   WBC, Wet Prep HPF POC MANY (A) NONE SEEN   Sperm NONE SEEN    MAU Course  Procedures  MDM Pt has lengthy history of fibromyalgia and uterine fibroids. She has not been seen by OBGYN office in 3 years, when she last had a colpo for abnormal pap and has not followed up. Pt is encouraged to follow up in clinic downstairs to have full gyn exam and evaluate for possible hysterectomy. Today, I will do a wet prep, ua and give RX for antiemetic; Send UA off for culture and GC/CH She should follow up in clinic and have outpatient ultrasound.  Assessment and Plan   Uterine Fibroids Nausea UTI  Rx: Zofran  RX: Macrobid  Discharge to home  [facility] 03/19/2015, 7:04 PM

## 2015-03-19 NOTE — Discharge Instructions (Signed)
Uterine Fibroids Uterine fibroids are tissue masses (tumors). They are also called leiomyomas. They can develop inside of a woman's womb (uterus). They can grow very large. Fibroids are not cancerous (benign). Most fibroids do not require medical treatment. HOME CARE  Keep all follow-up visits as told by your doctor. This is important.  Take medicines only as told by your doctor.  If you were prescribed a hormone treatment, take the hormone medicines exactly as told.  Do not take aspirin. It can cause bleeding.  Ask your doctor about taking iron pills and increasing the amount of dark green, leafy vegetables in your diet. These actions can help to boost your blood iron levels.  Pay close attention to your period. Tell your doctor about any changes, such as:  Increased blood flow. This may require you to use more pads or tampons than usual per month.  A change in the number of days that your period lasts per month.  A change in symptoms that come with your period, such as back pain or cramping in your belly area (abdomen). GET HELP IF:  You have pain in your back or the area between your hip bones (pelvic area) that is not controlled by medicines.  You have pain in your abdomen that is not controlled with medicines.  You have an increase in bleeding between and during periods.  You soak tampons or pads in a half hour or less.  You feel lightheaded.  You feel extra tired.  You feel weak. GET HELP RIGHT AWAY IF:   You pass out (faint).  You have a sudden increase in pelvic pain.   This information is not intended to replace advice given to you by your health care provider. Make sure you discuss any questions you have with your health care provider.   Document Released: 05/05/2010 Document Revised: 04/23/2014 Document Reviewed: 09/29/2013 Elsevier Interactive Patient Education 2016 Elsevier Inc. Urinary Tract Infection Urinary tract infections (UTIs) can develop anywhere  along your urinary tract. Your urinary tract is your body's drainage system for removing wastes and extra water. Your urinary tract includes two kidneys, two ureters, a bladder, and a urethra. Your kidneys are a pair of bean-shaped organs. Each kidney is about the size of your fist. They are located below your ribs, one on each side of your spine. CAUSES Infections are caused by microbes, which are microscopic organisms, including fungi, viruses, and bacteria. These organisms are so small that they can only be seen through a microscope. Bacteria are the microbes that most commonly cause UTIs. SYMPTOMS  Symptoms of UTIs may vary by age and gender of the patient and by the location of the infection. Symptoms in young women typically include a frequent and intense urge to urinate and a painful, burning feeling in the bladder or urethra during urination. Older women and men are more likely to be tired, shaky, and weak and have muscle aches and abdominal pain. A fever may mean the infection is in your kidneys. Other symptoms of a kidney infection include pain in your back or sides below the ribs, nausea, and vomiting. DIAGNOSIS To diagnose a UTI, your caregiver will ask you about your symptoms. Your caregiver will also ask you to provide a urine sample. The urine sample will be tested for bacteria and white blood cells. White blood cells are made by your body to help fight infection. TREATMENT  Typically, UTIs can be treated with medication. Because most UTIs are caused by a bacterial infection, they  usually can be treated with the use of antibiotics. The choice of antibiotic and length of treatment depend on your symptoms and the type of bacteria causing your infection. HOME CARE INSTRUCTIONS  If you were prescribed antibiotics, take them exactly as your caregiver instructs you. Finish the medication even if you feel better after you have only taken some of the medication.  Drink enough water and fluids to  keep your urine clear or pale yellow.  Avoid caffeine, tea, and carbonated beverages. They tend to irritate your bladder.  Empty your bladder often. Avoid holding urine for long periods of time.  Empty your bladder before and after sexual intercourse.  After a bowel movement, women should cleanse from front to back. Use each tissue only once. SEEK MEDICAL CARE IF:   You have back pain.  You develop a fever.  Your symptoms do not begin to resolve within 3 days. SEEK IMMEDIATE MEDICAL CARE IF:   You have severe back pain or lower abdominal pain.  You develop chills.  You have nausea or vomiting.  You have continued burning or discomfort with urination. MAKE SURE YOU:   Understand these instructions.  Will watch your condition.  Will get help right away if you are not doing well or get worse.   This information is not intended to replace advice given to you by your health care provider. Make sure you discuss any questions you have with your health care provider.   Document Released: 01/10/2005 Document Revised: 12/22/2014 Document Reviewed: 05/11/2011 Elsevier Interactive Patient Education [employer].

## 2015-03-19 NOTE — MAU Note (Signed)
Patient presents stating that she is not pregnant with c/o nausea X 1-11/2 weeks and abdominal pain X 1 week. Denies bleeding or discharge.

## 2015-03-21 LAB — URINE CULTURE: Special Requests: NORMAL

## 2015-03-25 ENCOUNTER — Telehealth: Payer: Self-pay | Admitting: Pulmonary Disease

## 2015-03-25 DIAGNOSIS — G4733 Obstructive sleep apnea (adult) (pediatric): Secondary | ICD-10-CM

## 2015-03-25 NOTE — Telephone Encounter (Signed)
Spoke with Eduardo Osier at [facility], states that pt is requesting a new cpap.  Per Estill Bamberg, pt qualifies for a new machine per insurance guidelines, and Estill Bamberg is able to pull ov notes from Epic.  She does request that we send her a staff message through Epic to let her know when the new cpap has been ordered.  VS are you ok with this order?  Thanks!

## 2015-03-25 NOTE — Telephone Encounter (Signed)
atc [facility], closed for lunch.  Will call back after 1:15 per answering service.

## 2015-03-25 NOTE — Telephone Encounter (Signed)
Please send order to set up new CPAP machine.  She needs CPAP 12 cm H2O with heated humidity.

## 2015-03-28 NOTE — Telephone Encounter (Signed)
Order has been placed per VS.  [facility] is aware of this. Nothing further was needed.

## 2015-04-19 ENCOUNTER — Ambulatory Visit (INDEPENDENT_AMBULATORY_CARE_PROVIDER_SITE_OTHER): Payer: Medicare Other | Admitting: Psychiatry

## 2015-04-19 ENCOUNTER — Encounter (HOSPITAL_COMMUNITY): Payer: Self-pay | Admitting: Psychiatry

## 2015-04-19 VITALS — BP 128/64 | HR 94 | Ht 62.0 in | Wt 302.0 lb

## 2015-04-19 DIAGNOSIS — R110 Nausea: Secondary | ICD-10-CM

## 2015-04-19 DIAGNOSIS — F331 Major depressive disorder, recurrent, moderate: Secondary | ICD-10-CM

## 2015-04-19 DIAGNOSIS — F410 Panic disorder [episodic paroxysmal anxiety] without agoraphobia: Secondary | ICD-10-CM

## 2015-04-19 DIAGNOSIS — F0630 Mood disorder due to known physiological condition, unspecified: Secondary | ICD-10-CM | POA: Diagnosis not present

## 2015-04-19 MED ORDER — FLUOXETINE HCL 20 MG PO CAPS: 20.0000 mg | ORAL_CAPSULE | Freq: Every day | ORAL | Status: DC

## 2015-04-19 MED ORDER — CLONAZEPAM 0.5 MG PO TABS: 0.5000 mg | ORAL_TABLET | Freq: Two times a day (BID) | ORAL | Status: DC | PRN

## 2015-04-19 MED ORDER — ARIPIPRAZOLE 2 MG PO TABS: 2.0000 mg | ORAL_TABLET | Freq: Every day | ORAL | Status: DC

## 2015-04-19 NOTE — Progress Notes (Signed)
Patient ID: Cheyenne Arias, female   DOB: 1976/03/05, 40 y.o.   MRN: [id number]  [place] Follow up outpatient visit   Cheyenne Arias [id number] 40 y.o.  04/19/2015 2:06 PM  Chief Complaint:  Anxiety, depression. History of Present Illness:   Patient Presents for follow-up and medication management for major depressive disorder. Panic disorder. PTSD.  Patient is a single African-American female diagnosed with multiple medical conditions including chronic back pain and migraines asthma. She has 3 kids 2 of which are living with her 91 year old daughter and a 80-year-old son.   As per initial evaluation " Patient has been diagnosed with depression and says possible bipolar by her [facility]. Says that she was a no-show one or 2 times and could not afford the no-show fee so she is transferring services that says that she has been doing reasonably regarding for depression with the current medication regimen. Says that she is on Abilify 2 mg for night terrors and depression. She is also on Klonopin 0.5 mg up to 2 times a day. Ambien 5 mg. Prozac 20 mg. Says that she goes to bed at 7 PM and then wakes up around 2 AM. I cautioned not to use the Klonopin and Ambien together and use her C Pap machine regularly and follow with a sleep specialist in regarding to her chronic sleep issues"   Mood disorder : gets frustrated at times. prozac helps Depression: on prozac. Takes care of her kids. Medical condition including fibromyalgia and spinal stenosis worries her. Panic condition; ongoing related with stress. Takes klonopine twice a day. Anxiety gets worsened if she lowers the dose Night terrors: takes abilify it helps otherwise she screams and wakes up. It also helps mood symtpoms.. Medical complexity/Data: has OSA uses cpap. Which helps her sleep. Has fibromyalgia which complicates her depression. She continues with topamax and  lyrica.  Severity of depression are scheduled and is 5 out of 10. 10 being very happy.   Aggravating factors: son in prison for armed robbery also include her medical conditions and taking care of the kids she has been on a wheelchair in the past because of fibromyalgia and her pain condition includes chronic back and neck pain. She also has a possibility of pituitary tumor that is being assessed  Modifying factors; some support and home health care    Past Psychiatric History/Hospitalization(s) Denies being hospitalized in psychiatric facility. Most of her treatment has been outpatient psychiatrist follow-up [facility]  Prior Suicide Attempts: No  Medical History; Past Medical History  Diagnosis Date  . Hypertension   . Migraine   . Anxiety   . Abnormal Pap smear   . Anemia   . Fibroids   . Fibromyalgia     degenerative disc disease  . Depression   . Sleep apnea   . Shortness of breath   . On home oxygen therapy     2 liters [place]  . PONV (postoperative nausea and vomiting)   . Low iron     pt states low iron-for Feraheme today in MAU  . Morbid obesity with BMI of 45.0-49.9, adult ([place])   . Iron deficiency anemia 04/24/2012  . Microcytic anemia 11/04/2013  . Unspecified deficiency anemia 11/04/2013  . Alpha thalassemia ([place])   . Asthma     recent admission with exacerbation of asthma 09/15/11    Allergies: Allergies  Allergen Reactions  . Latex Itching, Swelling  and Other (See Comments)    Reaction:  Blisters   . Morphine And Related Itching    Medications: Outpatient Encounter Prescriptions as of 04/19/2015  Medication Sig  . albuterol (PROVENTIL HFA;VENTOLIN HFA) 108 (90 BASE) MCG/ACT inhaler Inhale 2 puffs into the lungs every 6 (six) hours as needed for wheezing or shortness of breath.  [name] amLODipine (NORVASC) 10 MG tablet Take 10 mg by mouth daily.  . ARIPiprazole (ABILIFY) 2 MG tablet Take 1 tablet (2 mg total) by mouth  daily.  . budesonide (PULMICORT) 0.5 MG/2ML nebulizer solution Take 0.5 mg by nebulization 2 (two) times daily.  . clonazePAM (KLONOPIN) 0.5 MG tablet Take 1 tablet (0.5 mg total) by mouth 2 (two) times daily as needed for anxiety.  . cyclobenzaprine (FLEXERIL) 10 MG tablet Take 10 mg by mouth 3 (three) times daily as needed for muscle spasms.   . ferumoxytol (FERAHEME) 510 MG/17ML SOLN injection Inject 510 mg into the vein every 3 (three) months.  . fluconazole (DIFLUCAN) 150 MG tablet Take 1 tablet (150 mg total) by mouth daily.  [name] FLUoxetine (PROZAC) 20 MG capsule Take 1 capsule (20 mg total) by mouth daily.  . fluticasone (FLONASE) 50 MCG/ACT nasal spray Place 2 sprays into both nostrils 2 (two) times daily.  . furosemide (LASIX) 80 MG tablet Take 80 mg by mouth 2 (two) times daily.  [name] ibuprofen (ADVIL,MOTRIN) 800 MG tablet Take 800 mg by mouth 2 (two) times daily.  [name] ipratropium-albuterol (DUONEB) 0.5-2.5 (3) MG/3ML SOLN Take 3 mLs by nebulization every 4 (four) hours as needed (for wheezing/shortness of breath).  . lactulose (CHRONULAC) 10 GM/15ML solution Take 10 g by mouth 3 (three) times daily as needed for mild constipation.  [name] loratadine (CLARITIN) 10 MG tablet Take 10 mg by mouth at bedtime.  . metoprolol tartrate (LOPRESSOR) 25 MG tablet Take 25 mg by mouth 2 (two) times daily.  . montelukast (SINGULAIR) 10 MG tablet Take 10 mg by mouth at bedtime.  . Multiple Vitamins-Minerals (MULTIVITAMIN WITH MINERALS) tablet Take 1 tablet by mouth daily.  . ondansetron (ZOFRAN) 8 MG tablet Take 1 tablet (8 mg total) by mouth every 8 (eight) hours as needed for nausea or vomiting.  . Oxycodone HCl 10 MG TABS Take 10 mg by mouth 4 (four) times daily.   . pantoprazole (PROTONIX) 40 MG tablet Take 40 mg by mouth daily.  . potassium chloride SA (K-DUR,KLOR-CON) 20 MEQ tablet Take 20 mEq by mouth 2 (two) times daily.  . pregabalin (LYRICA) 150 MG capsule Take 150 mg by mouth 2 (two) times daily. Pt  takes with a 75mg  capsule.  . rizatriptan (MAXALT) 10 MG tablet Take 10 mg by mouth as needed for migraine. May repeat in 2 hours if needed  . topiramate (TOPAMAX) 100 MG tablet Take 100 mg by mouth 2 (two) times daily.  . [DISCONTINUED] ARIPiprazole (ABILIFY) 2 MG tablet Take 1 tablet (2 mg total) by mouth daily.  . [DISCONTINUED] clonazePAM (KLONOPIN) 0.5 MG tablet Take 0.5 mg by mouth 2 (two) times daily as needed for anxiety.  . [DISCONTINUED] FLUoxetine (PROZAC) 20 MG capsule Take 20 mg by mouth daily.   No facility-administered encounter medications on file as of 04/19/2015.     Family History; Family History  Problem Relation Age of Onset  . Sickle cell trait Maternal Aunt   . Sickle cell anemia Other   . Schizophrenia Son   . Depression Daughter        Labs:  Recent  Results (from the past 2160 hour(s))  Urinalysis, Routine w reflex microscopic (not at [facility])     Status: Abnormal   Collection Time: 03/19/15  5:47 PM  Result Value Ref Range   Color, Urine YELLOW YELLOW   APPearance CLEAR CLEAR   Specific Gravity, Urine 1.015 1.005 - 1.030   pH 6.5 5.0 - 8.0   Glucose, UA NEGATIVE NEGATIVE mg/dL   Hgb urine dipstick NEGATIVE NEGATIVE   Bilirubin Urine NEGATIVE NEGATIVE   Ketones, ur NEGATIVE NEGATIVE mg/dL   Protein, ur NEGATIVE NEGATIVE mg/dL   Nitrite NEGATIVE NEGATIVE   Leukocytes, UA MODERATE (A) NEGATIVE  Urine microscopic-add on     Status: Abnormal   Collection Time: 03/19/15  5:47 PM  Result Value Ref Range   Squamous Epithelial / LPF 0-5 (A) NONE SEEN   WBC, UA 6-30 0 - 5 WBC/hpf   RBC / HPF 0-5 0 - 5 RBC/hpf   Bacteria, UA MANY (A) NONE SEEN  Pregnancy, urine POC     Status: None   Collection Time: 03/19/15  6:26 PM  Result Value Ref Range   Preg Test, Ur NEGATIVE NEGATIVE    Comment:        THE SENSITIVITY OF THIS METHODOLOGY IS >24 mIU/mL   Urine culture     Status: None   Collection Time: 03/19/15  6:27 PM  Result Value Ref Range   Specimen  Description URINE, CLEAN CATCH    Special Requests Normal    Culture      MULTIPLE SPECIES PRESENT, SUGGEST RECOLLECTION Performed at [facility]    Report Status 03/21/2015 FINAL   Wet prep, genital     Status: Abnormal   Collection Time: 03/19/15  6:57 PM  Result Value Ref Range   Yeast Wet Prep HPF POC NONE SEEN NONE SEEN   Trich, Wet Prep NONE SEEN NONE SEEN   Clue Cells Wet Prep HPF POC NONE SEEN NONE SEEN   WBC, Wet Prep HPF POC MANY (A) NONE SEEN    Comment: MANY BACTERIA SEEN   Sperm NONE SEEN        Musculoskeletal: Strength & Muscle Tone: within normal limits Gait & Station: normal Patient leans: N/A  Mental Status Examination;   Psychiatric Specialty Exam: Physical Exam  Constitutional: She appears well-nourished.  Skin: She is not diaphoretic.    Review of Systems  Constitutional: Positive for malaise/fatigue. Negative for fever.  Cardiovascular: Negative for chest pain.  Gastrointestinal: Negative for nausea.  Skin: Negative for rash.  Psychiatric/Behavioral: Negative for suicidal ideas. The patient is nervous/anxious.     Blood pressure 128/64, pulse 94, height 5\' 2"  (1.575 m), weight 302 lb (136.986 kg), SpO2 99 %.Body mass index is 55.22 kg/(m^2).  General Appearance: Casual  Eye Contact::  Fair  Speech:  Slow  Volume:  Normal  Mood:  Somewhat dysphoric but not hopeless.  Affect:  Congruent  Thought Process:  Coherent  Orientation:  Full (Time, Place, and Person)  Thought Content:  Rumination  Suicidal Thoughts:  No  Homicidal Thoughts:  No  Memory:  Immediate;   Fair Recent;   Fair  Judgement:  Fair  Insight:  Fair  Psychomotor Activity:  Normal  Concentration:  Fair  Recall:  Fair  Akathisia:  Negative  Handed:  Right  AIMS (if indicated):     Assets:  Communication Skills Desire for Improvement Financial Resources/Insurance Housing  Sleep:        Assessment: Axis I: Major depressive disorder recurrent  moderate. Panic  disorder. Mood disorder otherwise nonspecified. Possible related  secondary to general medical condition including chronic iback pain  Axis II: Deferred   Axis III:  Past Medical History  Diagnosis Date  . Hypertension   . Migraine   . Anxiety   . Abnormal Pap smear   . Anemia   . Fibroids   . Fibromyalgia     degenerative disc disease  . Depression   . Sleep apnea   . Shortness of breath   . On home oxygen therapy     2 liters [place]  . PONV (postoperative nausea and vomiting)   . Low iron     pt states low iron-for Feraheme today in MAU  . Morbid obesity with BMI of 45.0-49.9, adult ([place])   . Iron deficiency anemia 04/24/2012  . Microcytic anemia 11/04/2013  . Unspecified deficiency anemia 11/04/2013  . Alpha thalassemia ([place])   . Asthma     recent admission with exacerbation of asthma 09/15/11    Axis IV: Psychosocial, single mom, multiple medical conditions.   Treatment Plan and Summary:  Depression: continue prozac and also for anxiety and possible PTSD. Refills sent.  She is not interested in counseling.  Says too much going on not sure if she can make up appointments.   Panic disorder; klonopine continue but avoid increaing dose  Abilify 2 mg for mood and night terrors and mood symptoms. . Refills written. She is also on Topamax and Lyrica which may also be helping the mood symptoms.  Pertinent Labs and Relevant Prior Notes reviewed. Medication Side effects, benefits and risks reviewed/discussed with Patient. Time given for patient to respond and asks questions regarding the Diagnosis and Medications. Safety concerns and to report to ER if suicidal or call 911. Relevant Medications refilled or called in to pharmacy. Discussed weight maintenance and Sleep Hygiene. Patient on cpap. Follow up with Primary care provider in regards to Medical conditions. Recommend compliance with medications and follow up office appointments. Discussed to avail opportunity to consider  or/and continue Individual therapy with Counselor. Greater than 50% of time was spend in counseling and coordination of care with the patient.  Schedule for Follow up visit in 2  month or call in earlier as necessary.   Merian Capron, MD 04/19/2015

## 2015-04-20 ENCOUNTER — Ambulatory Visit (HOSPITAL_BASED_OUTPATIENT_CLINIC_OR_DEPARTMENT_OTHER): Payer: Medicare Other

## 2015-04-20 DIAGNOSIS — D509 Iron deficiency anemia, unspecified: Secondary | ICD-10-CM

## 2015-04-20 LAB — CBC & DIFF AND RETIC
BASO%: 0.2 % (ref 0.0–2.0)
BASOS ABS: 0 10*3/uL (ref 0.0–0.1)
EOS%: 2.5 % (ref 0.0–7.0)
Eosinophils Absolute: 0.3 10*3/uL (ref 0.0–0.5)
HEMATOCRIT: 34.5 % — AB (ref 34.8–46.6)
HGB: 10.9 g/dL — ABNORMAL LOW (ref 11.6–15.9)
Immature Retic Fract: 10.2 % — ABNORMAL HIGH (ref 1.60–10.00)
LYMPH%: 19.2 % (ref 14.0–49.7)
MCH: 24.8 pg — AB (ref 25.1–34.0)
MCHC: 31.6 g/dL (ref 31.5–36.0)
MCV: 78.6 fL — AB (ref 79.5–101.0)
MONO#: 0.5 10*3/uL (ref 0.1–0.9)
MONO%: 4.2 % (ref 0.0–14.0)
NEUT#: 8.9 10*3/uL — ABNORMAL HIGH (ref 1.5–6.5)
NEUT%: 73.9 % (ref 38.4–76.8)
PLATELETS: 359 10*3/uL (ref 145–400)
RBC: 4.39 10*6/uL (ref 3.70–5.45)
RDW: 15.8 % — ABNORMAL HIGH (ref 11.2–14.5)
Retic %: 1.32 % (ref 0.70–2.10)
Retic Ct Abs: 57.95 10*3/uL (ref 33.70–90.70)
WBC: 12 10*3/uL — ABNORMAL HIGH (ref 3.9–10.3)
lymph#: 2.3 10*3/uL (ref 0.9–3.3)

## 2015-04-20 LAB — FERRITIN: Ferritin: 380 ng/ml — ABNORMAL HIGH (ref 9–269)

## 2015-04-21 ENCOUNTER — Telehealth: Payer: Self-pay | Admitting: *Deleted

## 2015-04-21 ENCOUNTER — Other Ambulatory Visit: Payer: Medicare Other

## 2015-04-21 ENCOUNTER — Ambulatory Visit: Payer: Medicare Other | Admitting: Hematology and Oncology

## 2015-04-21 ENCOUNTER — Other Ambulatory Visit: Payer: Self-pay | Admitting: Hematology and Oncology

## 2015-04-21 DIAGNOSIS — D509 Iron deficiency anemia, unspecified: Secondary | ICD-10-CM

## 2015-04-21 NOTE — Telephone Encounter (Signed)
-----   Message from Heath Lark, MD sent at 04/21/2015  9:16 AM EST ----- Regarding: blood tests Her ferritin is high, does not need IV iron. Can you call her to let her know I plan to reschedule to 3 months If OK, let me know and I will place order and POF  ----- Message -----    From: [facility]    Sent: 04/20/2015   2:51 PM      To: Heath Lark, MD

## 2015-04-21 NOTE — Telephone Encounter (Signed)
Informed pt of Dr. Calton Dach message below.  She agrees and ok w/ r/s in 3 months.

## 2015-04-26 ENCOUNTER — Telehealth: Payer: Self-pay | Admitting: Hematology and Oncology

## 2015-04-26 NOTE — Telephone Encounter (Signed)
Not able to reach patient or leave message - phone not taking calls. Schedule mailed.

## 2015-04-28 ENCOUNTER — Ambulatory Visit: Payer: Self-pay | Admitting: Hematology and Oncology

## 2015-04-28 ENCOUNTER — Ambulatory Visit: Payer: Self-pay

## 2015-05-02 ENCOUNTER — Ambulatory Visit: Payer: Self-pay | Admitting: Obstetrics & Gynecology

## 2015-05-04 DIAGNOSIS — F5101 Primary insomnia: Secondary | ICD-10-CM | POA: Insufficient documentation

## 2015-05-05 ENCOUNTER — Ambulatory Visit: Payer: Self-pay

## 2015-05-06 ENCOUNTER — Ambulatory Visit: Payer: Self-pay | Admitting: Adult Health

## 2015-05-09 ENCOUNTER — Other Ambulatory Visit: Payer: Self-pay | Admitting: Adult Health

## 2015-05-12 ENCOUNTER — Ambulatory Visit: Payer: Self-pay | Admitting: Obstetrics & Gynecology

## 2015-05-17 ENCOUNTER — Ambulatory Visit: Payer: Self-pay | Admitting: Adult Health

## 2015-05-23 ENCOUNTER — Other Ambulatory Visit: Payer: Self-pay | Admitting: Adult Health

## 2015-06-01 ENCOUNTER — Ambulatory Visit (INDEPENDENT_AMBULATORY_CARE_PROVIDER_SITE_OTHER): Payer: Medicare Other | Admitting: Adult Health

## 2015-06-01 ENCOUNTER — Encounter: Payer: Self-pay | Admitting: Adult Health

## 2015-06-01 VITALS — BP 104/72 | HR 111 | Temp 98.4°F | Ht 62.0 in | Wt 306.0 lb

## 2015-06-01 DIAGNOSIS — D259 Leiomyoma of uterus, unspecified: Secondary | ICD-10-CM | POA: Diagnosis not present

## 2015-06-01 DIAGNOSIS — R110 Nausea: Secondary | ICD-10-CM

## 2015-06-01 DIAGNOSIS — N390 Urinary tract infection, site not specified: Secondary | ICD-10-CM | POA: Diagnosis not present

## 2015-06-01 DIAGNOSIS — J45998 Other asthma: Secondary | ICD-10-CM

## 2015-06-01 DIAGNOSIS — G4733 Obstructive sleep apnea (adult) (pediatric): Secondary | ICD-10-CM | POA: Diagnosis not present

## 2015-06-01 DIAGNOSIS — IMO0002 Reserved for concepts with insufficient information to code with codable children: Secondary | ICD-10-CM

## 2015-06-01 MED ORDER — MONTELUKAST SODIUM 10 MG PO TABS: 10.0000 mg | ORAL_TABLET | Freq: Every day | ORAL | Status: DC

## 2015-06-01 MED ORDER — LORATADINE 10 MG PO TABS: 10.0000 mg | ORAL_TABLET | Freq: Every day | ORAL | Status: DC

## 2015-06-01 NOTE — Patient Instructions (Addendum)
Keep CPAP At bedtime and naps Increase CPAP pressure 13cmH20  Do not drive if sleepy.  Work on weight loss.  Continue on current regimen .  Follow up Dr. Halford Chessman  In 4 months and As needed

## 2015-06-02 DIAGNOSIS — M4800 Spinal stenosis, site unspecified: Secondary | ICD-10-CM | POA: Insufficient documentation

## 2015-06-06 NOTE — Progress Notes (Signed)
Subjective:    Patient ID: Cheyenne Arias, female    DOB: 1975/08/24, 40 y.o.   MRN: [id number]  HPI 40 yo female never smoker morbidly obese with  asthma, OSA, OHS.  06/01/15 Follow up ; OSA /OHS and Asthma  Pt returns for follow up . Says she is wearing CPAP every night with O2.  Says she she is doing well but does feel like she needs jsut a little more pressure sometimes with machine when sleeping.  Wears most nights 4-8 hr  Download shows excellent compliance with avg usage at 6.5hr. Minimal leaks. AHI 1.1 . Set pressure at 12cm.   Doing okay with her asthma, no flare in cough/wheezing .  Denies increased SABA use.  Remains on pulmicort neb , singulair.  She denies any chest pain, orthopnea, PND, or increased leg swelling.    Tests: PSG 03/27/11 >> AHI 10.9 Echo 09/20/11 > >mod LVH, EF 65 to 70%, small/mod pericardial effusion  Doppler legs 09/21/11 >> no DVT CT chest 09/22/11 >> no PE, Lt base ATX/ASD, borderline cardiomegaly Spirometry 01/10/12 >> FEV1 1.72 (71%), FEV1% 77 CT sinus 02/28/12 >> clear paranasal sinuses RAST 02/28/12 >> IgE 15.6, moderate reaction to [facility] CT chest 04/03/12 >> ASD superior segment LLL, RLL ATX PFT 12/17/12 >> FEV1 2.14 (95%), FEV1% 88, TLC 2.62 (58%), DLCO 96%, no BD CPAP 03/01/13 to 03/30/13 >> Used on 18 of 30 nights with average 5 hrs 58 minutes.  Average AHI 0.7 with CPAP 10 cm H2O.    Review of Systems Constitutional:   No  weight loss, night sweats,  Fevers, chills,  +fatigue, or  lassitude.  HEENT:   No headaches,  Difficulty swallowing,  Tooth/dental problems, or  Sore throat,                No sneezing, itching, ear ache, nasal congestion, post nasal drip,   CV:  No chest pain,  Orthopnea, PND, swelling in lower extremities, anasarca, dizziness, palpitations, syncope.   GI  No heartburn, indigestion, abdominal pain, nausea, vomiting, diarrhea, change in bowel habits, loss of appetite, bloody stools.   Resp:   No chest wall  deformity  Skin: no rash or lesions.  GU: no dysuria, change in color of urine, no urgency or frequency.  No flank pain, no hematuria   MS:  No joint pain or swelling.  No decreased range of motion.  No back pain.  Psych:  No memory loss.         Objective:   Physical Exam Filed Vitals:   06/01/15 1155  BP: 104/72  Pulse: 111  Temp: 98.4 F (36.9 C)  TempSrc: Oral  Height: 5\' 2"  (1.575 m)  Weight: 306 lb (138.801 kg)  SpO2: 97%   Body mass index is 55.95 kg/(m^2).   GEN: A/Ox3; pleasant , NAD, obese   HEENT:  [place]/AT,  EACs-clear, TMs-wnl, NOSE-clear, THROAT-clear, no lesions, no postnasal drip or exudate noted Class 3 airway.   NECK:  Supple w/ fair ROM; no JVD; normal carotid impulses w/o bruits; no thyromegaly or nodules palpated; no lymphadenopathy.  RESP  Clear  P & A; w/o, wheezes/ rales/ or rhonchi.no accessory muscle use, no dullness to percussion  CARD:  RRR, no m/r/g  , tr  peripheral edema, pulses intact, no cyanosis or clubbing.  GI:   Soft & nt; nml bowel sounds; no organomegaly or masses detected.  Musco: Warm bil, no deformities or joint swelling noted.   Neuro: alert, no focal deficits  noted.    Skin: Warm, no lesions or rashes         Assessment & Plan:

## 2015-06-06 NOTE — Assessment & Plan Note (Signed)
Keep CPAP At bedtime and naps Increase CPAP pressure 13cmH20  Do not drive if sleepy.  Work on weight loss.  Continue on current regimen .  Follow up Dr. Halford Chessman  In 4 months and As needed

## 2015-06-08 NOTE — Assessment & Plan Note (Signed)
Controlled cont on current regimen

## 2015-06-09 ENCOUNTER — Other Ambulatory Visit: Payer: Self-pay | Admitting: Adult Health

## 2015-06-15 ENCOUNTER — Encounter: Payer: Self-pay | Admitting: Pulmonary Disease

## 2015-06-15 ENCOUNTER — Ambulatory Visit (INDEPENDENT_AMBULATORY_CARE_PROVIDER_SITE_OTHER): Payer: Medicare Other | Admitting: Pulmonary Disease

## 2015-06-15 VITALS — BP 122/70 | HR 99 | Ht 62.4 in | Wt 304.0 lb

## 2015-06-15 DIAGNOSIS — G4733 Obstructive sleep apnea (adult) (pediatric): Secondary | ICD-10-CM

## 2015-06-15 DIAGNOSIS — J9611 Chronic respiratory failure with hypoxia: Secondary | ICD-10-CM

## 2015-06-15 DIAGNOSIS — J45998 Other asthma: Secondary | ICD-10-CM

## 2015-06-15 DIAGNOSIS — Z9989 Dependence on other enabling machines and devices: Principal | ICD-10-CM

## 2015-06-15 DIAGNOSIS — IMO0002 Reserved for concepts with insufficient information to code with codable children: Secondary | ICD-10-CM

## 2015-06-15 DIAGNOSIS — E662 Morbid (severe) obesity with alveolar hypoventilation: Secondary | ICD-10-CM

## 2015-06-15 NOTE — Progress Notes (Signed)
Current Outpatient Prescriptions on File Prior to Visit  Medication Sig  . albuterol (PROVENTIL HFA;VENTOLIN HFA) 108 (90 BASE) MCG/ACT inhaler Inhale 2 puffs into the lungs every 6 (six) hours as needed for wheezing or shortness of breath.  [name] amLODipine (NORVASC) 10 MG tablet Take 10 mg by mouth daily.  . ARIPiprazole (ABILIFY) 2 MG tablet Take 1 tablet (2 mg total) by mouth daily.  . budesonide (PULMICORT) 0.5 MG/2ML nebulizer solution Take 0.5 mg by nebulization 2 (two) times daily.  . clonazePAM (KLONOPIN) 0.5 MG tablet Take 1 tablet (0.5 mg total) by mouth 2 (two) times daily as needed for anxiety.  . cyclobenzaprine (FLEXERIL) 10 MG tablet Take 10 mg by mouth 3 (three) times daily as needed for muscle spasms.   . ferumoxytol (FERAHEME) 510 MG/17ML SOLN injection Inject 510 mg into the vein every 3 (three) months.  [name] FLUoxetine (PROZAC) 20 MG capsule Take 1 capsule (20 mg total) by mouth daily.  . fluticasone (FLONASE) 50 MCG/ACT nasal spray Place 2 sprays into both nostrils 2 (two) times daily.  . furosemide (LASIX) 80 MG tablet Take 80 mg by mouth 2 (two) times daily.  [name] ibuprofen (ADVIL,MOTRIN) 800 MG tablet Take 800 mg by mouth 2 (two) times daily.  [name] ipratropium-albuterol (DUONEB) 0.5-2.5 (3) MG/3ML SOLN Take 3 mLs by nebulization every 4 (four) hours as needed (for wheezing/shortness of breath).  [name] ipratropium-albuterol (DUONEB) 0.5-2.5 (3) MG/3ML SOLN TAKE 3 MLS BY NEBULIZATION EVERY 4 (FOUR) HOURS AS NEEDED (FOR WHEEZING).  [name] lactulose (CHRONULAC) 10 GM/15ML solution Take 10 g by mouth 3 (three) times daily as needed for mild constipation.  [name] loratadine (CLARITIN) 10 MG tablet Take 1 tablet (10 mg total) by mouth at bedtime.  . metoprolol tartrate (LOPRESSOR) 25 MG tablet Take 25 mg by mouth 2 (two) times daily.  . montelukast (SINGULAIR) 10 MG tablet Take 1 tablet (10 mg total) by mouth at bedtime.  . Multiple Vitamins-Minerals (MULTIVITAMIN WITH MINERALS) tablet Take 1 tablet by  mouth daily.  . ondansetron (ZOFRAN) 8 MG tablet Take 1 tablet (8 mg total) by mouth every 8 (eight) hours as needed for nausea or vomiting.  . Oxycodone HCl 10 MG TABS Take 10 mg by mouth 4 (four) times daily.   . pantoprazole (PROTONIX) 40 MG tablet TAKE 1 TABLET (40 MG TOTAL) BY MOUTH DAILY.  [name] potassium chloride SA (K-DUR,KLOR-CON) 20 MEQ tablet Take 20 mEq by mouth 2 (two) times daily.  . pregabalin (LYRICA) 150 MG capsule Take 150 mg by mouth 2 (two) times daily.  . pregabalin (LYRICA) 75 MG capsule Take 75 mg by mouth 2 (two) times daily.  [name] PULMICORT 0.5 MG/2ML nebulizer solution USE 1 VIAL VIA NEBULIZER TWICE DAILY**NEED ICD10 CODE**.  . rizatriptan (MAXALT) 10 MG tablet Take 10 mg by mouth as needed for migraine. May repeat in 2 hours if needed  . topiramate (TOPAMAX) 100 MG tablet Take 100 mg by mouth 2 (two) times daily.  . [DISCONTINUED] zolpidem (AMBIEN) 5 MG tablet Take 5 mg by mouth at bedtime as needed for sleep.    No current facility-administered medications on file prior to visit.     Chief Complaint  Patient presents with  . Follow-up    Pt here per [facility] for face-to-face of O2 compliance. Pt c/o snoring and labored breathing.     Tests PSG 03/27/11 >> AHI 10.9 Echo 09/20/11 > >mod LVH, EF 65 to 70%, small/mod pericardial effusion  Doppler legs 09/21/11 >> no DVT  CT chest 09/22/11 >> no PE, Lt base ATX/ASD, borderline cardiomegaly Spirometry 01/10/12 >> FEV1 1.72 (71%), FEV1% 77 CT sinus 02/28/12 >> clear paranasal sinuses RAST 02/28/12 >> IgE 15.6, moderate reaction to [facility] CT chest 04/03/12 >> ASD superior segment LLL, RLL ATX PFT 12/17/12 >> FEV1 2.14 (95%), FEV1% 88, TLC 2.62 (58%), DLCO 96%, no BD CPAP 05/15/15 to 06/13/15 >> used on 30 of 30 nights with average 6 hrs 55 min.  Average AHI 1 with CPAP 13 cm H2O  Past medical hx HTN, HA, Anxiety, Fibromyalgia, Depression, Alpha thalassemia, Iron deficiency anemia, Spinal stenosis  Past surgical hx, Allergies,  Family hx, Social hx all reviewed.  Vital Signs BP 122/70 mmHg  Pulse 99  Ht 5' 2.4" (1.585 m)  Wt 304 lb (137.893 kg)  BMI 54.89 kg/m2  SpO2 99%  History of Present Illness Cheyenne Arias is a 40 y.o. female with asthma, OSA, OHS, and chronic respiratory failure.  She was seen in February by Tammy Parrett >> [facility] didn't read her note properly and told patient she needed another visit to discuss continued therapy with oxygen at night with CPAP.  She uses CPAP with 2 liters oxygen at night.  She has nasal mask.  She gets mouth leak and then her mother hears her snoring.    She is not having trouble with her breathing.  She denies cough, wheeze, sputum, or hemoptysis.   Physical Exam  General - No distress ENT - No sinus tenderness, no oral exudate, no LAN, MP 2, 2+ tonsils Cardiac - s1s2 regular, no murmur Chest - No wheeze/rales/dullness Back - No focal tenderness Abd - Soft, non-tender Ext - No edema Neuro - Normal strength Skin - No rashes Psych - normal mood, and behavior   Assessment/Plan  Persistent asthma. Plan: - continue pulmicort, singulair, duoneb  Obstructive sleep apnea. Plan: - continue CPAP 13 cm H2O - will arrange for full face mask  Chronic hypoxic respiratory failure 2nd to obesity hypoventilation syndrome. Plan: - continue 2 liters oxygen at night with CPAP  Morbid obesity. Plan: - discussed importance of weight loss    Patient Instructions  Will arrange for new CPAP mask  Follow up in April 2017 as previously scheduled    Chesley Mires, MD [facility] Pul[facility]/Critical Care/Sleep Pager:  [phone]

## 2015-06-15 NOTE — Patient Instructions (Signed)
Will arrange for new CPAP mask  Follow up in April 2017 as previously scheduled

## 2015-06-16 ENCOUNTER — Ambulatory Visit: Payer: Self-pay | Admitting: Family Medicine

## 2015-06-17 ENCOUNTER — Other Ambulatory Visit: Payer: Self-pay | Admitting: Adult Health

## 2015-06-17 DIAGNOSIS — N390 Urinary tract infection, site not specified: Secondary | ICD-10-CM

## 2015-06-17 DIAGNOSIS — D259 Leiomyoma of uterus, unspecified: Secondary | ICD-10-CM

## 2015-06-17 DIAGNOSIS — R110 Nausea: Secondary | ICD-10-CM

## 2015-06-17 MED ORDER — IPRATROPIUM-ALBUTEROL 0.5-2.5 (3) MG/3ML IN SOLN: 3.0000 mL | RESPIRATORY_TRACT | Status: DC | PRN

## 2015-06-17 MED ORDER — BUDESONIDE 0.5 MG/2ML IN SUSP: 0.5000 mg | Freq: Two times a day (BID) | RESPIRATORY_TRACT | Status: DC

## 2015-06-17 NOTE — Telephone Encounter (Signed)
Received form for [facility] stating that DX code needs to be added to Rx. I called and spoke with [facility] pharmacy tech. She states that an Rx for the budesonide and duonebt needs to been sent back in with the DX code in the pharmacy comments in order for them to process he medication. I explained to that I would send that in asap. She voiced understanding and had no further questions. She informed me if anything else was needed she would contact our office. Both Rx resent with DX code of J45.998. Nothing further needed at this time.

## 2015-06-20 ENCOUNTER — Ambulatory Visit (HOSPITAL_COMMUNITY): Payer: Self-pay | Admitting: Psychiatry

## 2015-07-02 ENCOUNTER — Emergency Department (HOSPITAL_BASED_OUTPATIENT_CLINIC_OR_DEPARTMENT_OTHER): Payer: Medicare Other

## 2015-07-02 ENCOUNTER — Encounter (HOSPITAL_BASED_OUTPATIENT_CLINIC_OR_DEPARTMENT_OTHER): Payer: Self-pay | Admitting: *Deleted

## 2015-07-02 ENCOUNTER — Inpatient Hospital Stay (HOSPITAL_BASED_OUTPATIENT_CLINIC_OR_DEPARTMENT_OTHER)
Admission: EM | Admit: 2015-07-02 | Discharge: 2015-07-04 | DRG: 871 | Disposition: A | Discharge status: Home / Self Care | Payer: Medicare Other | Attending: Internal Medicine | Admitting: Internal Medicine

## 2015-07-02 DIAGNOSIS — D649 Anemia, unspecified: Secondary | ICD-10-CM | POA: Diagnosis present

## 2015-07-02 DIAGNOSIS — D500 Iron deficiency anemia secondary to blood loss (chronic): Secondary | ICD-10-CM

## 2015-07-02 DIAGNOSIS — D509 Iron deficiency anemia, unspecified: Secondary | ICD-10-CM | POA: Diagnosis present

## 2015-07-02 DIAGNOSIS — Z6841 Body Mass Index (BMI) 40.0 and over, adult: Secondary | ICD-10-CM | POA: Diagnosis not present

## 2015-07-02 DIAGNOSIS — R509 Fever, unspecified: Secondary | ICD-10-CM | POA: Diagnosis present

## 2015-07-02 DIAGNOSIS — A419 Sepsis, unspecified organism: Principal | ICD-10-CM | POA: Diagnosis present

## 2015-07-02 DIAGNOSIS — I10 Essential (primary) hypertension: Secondary | ICD-10-CM | POA: Diagnosis present

## 2015-07-02 DIAGNOSIS — Z832 Family history of diseases of the blood and blood-forming organs and certain disorders involving the immune mechanism: Secondary | ICD-10-CM

## 2015-07-02 DIAGNOSIS — F419 Anxiety disorder, unspecified: Secondary | ICD-10-CM | POA: Diagnosis present

## 2015-07-02 DIAGNOSIS — J45909 Unspecified asthma, uncomplicated: Secondary | ICD-10-CM | POA: Diagnosis present

## 2015-07-02 DIAGNOSIS — Z79899 Other long term (current) drug therapy: Secondary | ICD-10-CM | POA: Diagnosis not present

## 2015-07-02 DIAGNOSIS — J189 Pneumonia, unspecified organism: Secondary | ICD-10-CM

## 2015-07-02 DIAGNOSIS — J1000 Influenza due to other identified influenza virus with unspecified type of pneumonia: Secondary | ICD-10-CM | POA: Diagnosis present

## 2015-07-02 DIAGNOSIS — Z9981 Dependence on supplemental oxygen: Secondary | ICD-10-CM

## 2015-07-02 DIAGNOSIS — I959 Hypotension, unspecified: Secondary | ICD-10-CM | POA: Diagnosis present

## 2015-07-02 DIAGNOSIS — D560 Alpha thalassemia: Secondary | ICD-10-CM | POA: Diagnosis present

## 2015-07-02 DIAGNOSIS — M797 Fibromyalgia: Secondary | ICD-10-CM | POA: Diagnosis present

## 2015-07-02 DIAGNOSIS — E876 Hypokalemia: Secondary | ICD-10-CM | POA: Diagnosis present

## 2015-07-02 DIAGNOSIS — Z91040 Latex allergy status: Secondary | ICD-10-CM

## 2015-07-02 DIAGNOSIS — Z885 Allergy status to narcotic agent status: Secondary | ICD-10-CM

## 2015-07-02 DIAGNOSIS — Z79891 Long term (current) use of opiate analgesic: Secondary | ICD-10-CM | POA: Diagnosis not present

## 2015-07-02 DIAGNOSIS — F329 Major depressive disorder, single episode, unspecified: Secondary | ICD-10-CM | POA: Diagnosis present

## 2015-07-02 DIAGNOSIS — R000 Tachycardia, unspecified: Secondary | ICD-10-CM | POA: Diagnosis present

## 2015-07-02 DIAGNOSIS — G4733 Obstructive sleep apnea (adult) (pediatric): Secondary | ICD-10-CM | POA: Diagnosis present

## 2015-07-02 DIAGNOSIS — D72829 Elevated white blood cell count, unspecified: Secondary | ICD-10-CM | POA: Diagnosis present

## 2015-07-02 HISTORY — DX: Spinal stenosis, site unspecified: M48.00

## 2015-07-02 HISTORY — DX: Allergy, unspecified, initial encounter: T78.40XA

## 2015-07-02 HISTORY — DX: Obesity, unspecified: E66.9

## 2015-07-02 HISTORY — DX: Dependence on other enabling machines and devices: Z99.89

## 2015-07-02 HISTORY — DX: Obstructive sleep apnea (adult) (pediatric): G47.33

## 2015-07-02 HISTORY — DX: Unspecified asthma, uncomplicated: J45.909

## 2015-07-02 HISTORY — DX: Migraine, unspecified, not intractable, without status migrainosus: G43.909

## 2015-07-02 HISTORY — DX: Polyneuropathy, unspecified: G62.9

## 2015-07-02 LAB — CBC WITH DIFFERENTIAL/PLATELET
BASOS ABS: 0 10*3/uL (ref 0.0–0.1)
Basophils Relative: 0 %
EOS ABS: 0 10*3/uL (ref 0.0–0.7)
Eosinophils Relative: 0 %
HCT: 30 % — ABNORMAL LOW (ref 36.0–46.0)
HEMOGLOBIN: 9.2 g/dL — AB (ref 12.0–15.0)
LYMPHS PCT: 6 %
Lymphs Abs: 0.9 10*3/uL (ref 0.7–4.0)
MCH: 22.8 pg — ABNORMAL LOW (ref 26.0–34.0)
MCHC: 30.7 g/dL (ref 30.0–36.0)
MCV: 74.4 fL — ABNORMAL LOW (ref 78.0–100.0)
MONOS PCT: 3 %
Monocytes Absolute: 0.5 10*3/uL (ref 0.1–1.0)
Neutro Abs: 13.9 10*3/uL — ABNORMAL HIGH (ref 1.7–7.7)
Neutrophils Relative %: 91 %
PLATELETS: 351 10*3/uL (ref 150–400)
RBC: 4.03 MIL/uL (ref 3.87–5.11)
RDW: 16.7 % — ABNORMAL HIGH (ref 11.5–15.5)
WBC: 15.3 10*3/uL — ABNORMAL HIGH (ref 4.0–10.5)

## 2015-07-02 LAB — COMPREHENSIVE METABOLIC PANEL
ALBUMIN: 3.5 g/dL (ref 3.5–5.0)
ALK PHOS: 69 U/L (ref 38–126)
ALT: 68 U/L — ABNORMAL HIGH (ref 14–54)
ANION GAP: 11 (ref 5–15)
AST: 56 U/L — ABNORMAL HIGH (ref 15–41)
BILIRUBIN TOTAL: 0.4 mg/dL (ref 0.3–1.2)
BUN: 11 mg/dL (ref 6–20)
CALCIUM: 8.6 mg/dL — AB (ref 8.9–10.3)
CO2: 19 mmol/L — ABNORMAL LOW (ref 22–32)
Chloride: 106 mmol/L (ref 101–111)
Creatinine, Ser: 0.94 mg/dL (ref 0.44–1.00)
GFR calc non Af Amer: >60 mL/min (ref >60)
GLUCOSE: 111 mg/dL — AB (ref 65–99)
POTASSIUM: 3.1 mmol/L — AB (ref 3.5–5.1)
SODIUM: 136 mmol/L (ref 135–145)
TOTAL PROTEIN: 7.9 g/dL (ref 6.5–8.1)

## 2015-07-02 LAB — URINALYSIS, ROUTINE W REFLEX MICROSCOPIC
Bilirubin Urine: NEGATIVE
Glucose, UA: NEGATIVE mg/dL
Ketones, ur: NEGATIVE mg/dL
LEUKOCYTES UA: NEGATIVE
NITRITE: NEGATIVE
PROTEIN: NEGATIVE mg/dL
SPECIFIC GRAVITY, URINE: 1.012 (ref 1.005–1.030)
pH: 6.5 (ref 5.0–8.0)

## 2015-07-02 LAB — PROCALCITONIN: Procalcitonin: 2.25 ng/mL

## 2015-07-02 LAB — URINE MICROSCOPIC-ADD ON: WBC UA: NONE SEEN WBC/hpf (ref 0–5)

## 2015-07-02 LAB — INFLUENZA PANEL BY PCR (TYPE A & B)
H1N1 flu by pcr: NOT DETECTED
Influenza A By PCR: POSITIVE — AB
Influenza B By PCR: NEGATIVE

## 2015-07-02 LAB — I-STAT CG4 LACTIC ACID, ED: Lactic Acid, Venous: 1.55 mmol/L (ref 0.5–2.0)

## 2015-07-02 LAB — LACTIC ACID, PLASMA
LACTIC ACID, VENOUS: 1.3 mmol/L (ref 0.5–2.0)
Lactic Acid, Venous: 2.1 mmol/L (ref 0.5–2.0)

## 2015-07-02 MED ORDER — POTASSIUM CHLORIDE CRYS ER 20 MEQ PO TBCR
20.0000 meq | EXTENDED_RELEASE_TABLET | Freq: Two times a day (BID) | ORAL | Status: DC
  Administered 2015-07-02 – 2015-07-04 (×5): 20 meq via ORAL
  Filled 2015-07-02 (×7): qty 1

## 2015-07-02 MED ORDER — IPRATROPIUM-ALBUTEROL 0.5-2.5 (3) MG/3ML IN SOLN
3.0000 mL | RESPIRATORY_TRACT | Status: DC
  Administered 2015-07-02 (×3): 3 mL via RESPIRATORY_TRACT
  Filled 2015-07-02 (×3): qty 3

## 2015-07-02 MED ORDER — IPRATROPIUM-ALBUTEROL 0.5-2.5 (3) MG/3ML IN SOLN
3.0000 mL | Freq: Four times a day (QID) | RESPIRATORY_TRACT | Status: DC
  Administered 2015-07-03 (×3): 3 mL via RESPIRATORY_TRACT
  Filled 2015-07-02 (×3): qty 3

## 2015-07-02 MED ORDER — IPRATROPIUM-ALBUTEROL 0.5-2.5 (3) MG/3ML IN SOLN: 3.0000 mL | RESPIRATORY_TRACT | Status: DC | PRN

## 2015-07-02 MED ORDER — DEXTROSE 5 % IV SOLN
1.0000 g | Freq: Once | INTRAVENOUS | Status: AC
  Administered 2015-07-02: 1 g via INTRAVENOUS
  Filled 2015-07-02: qty 10

## 2015-07-02 MED ORDER — GUAIFENESIN ER 600 MG PO TB12
600.0000 mg | ORAL_TABLET | Freq: Two times a day (BID) | ORAL | Status: DC | PRN
  Administered 2015-07-02: 600 mg via ORAL
  Filled 2015-07-02: qty 1

## 2015-07-02 MED ORDER — SODIUM CHLORIDE 0.9 % IV SOLN
INTRAVENOUS | Status: AC
  Administered 2015-07-02: 12:00:00 via INTRAVENOUS

## 2015-07-02 MED ORDER — SODIUM CHLORIDE 0.9 % IV SOLN
1000.0000 mL | INTRAVENOUS | Status: DC
  Administered 2015-07-02: 1000 mL via INTRAVENOUS

## 2015-07-02 MED ORDER — KETOROLAC TROMETHAMINE 30 MG/ML IJ SOLN
30.0000 mg | Freq: Once | INTRAMUSCULAR | Status: AC
  Administered 2015-07-02: 30 mg via INTRAVENOUS
  Filled 2015-07-02: qty 1

## 2015-07-02 MED ORDER — SODIUM CHLORIDE 0.9 % IV SOLN
1000.0000 mL | Freq: Once | INTRAVENOUS | Status: AC
  Administered 2015-07-02: 1000 mL via INTRAVENOUS

## 2015-07-02 MED ORDER — CLONAZEPAM 0.5 MG PO TABS: 0.5000 mg | ORAL_TABLET | Freq: Two times a day (BID) | ORAL | Status: DC | PRN

## 2015-07-02 MED ORDER — MONTELUKAST SODIUM 10 MG PO TABS
10.0000 mg | ORAL_TABLET | Freq: Every day | ORAL | Status: DC
  Administered 2015-07-02 – 2015-07-03 (×2): 10 mg via ORAL
  Filled 2015-07-02 (×3): qty 1

## 2015-07-02 MED ORDER — FLUTICASONE PROPIONATE 50 MCG/ACT NA SUSP
2.0000 | Freq: Two times a day (BID) | NASAL | Status: DC
  Administered 2015-07-02 – 2015-07-04 (×5): 2 via NASAL
  Filled 2015-07-02: qty 16

## 2015-07-02 MED ORDER — ARIPIPRAZOLE 2 MG PO TABS
2.0000 mg | ORAL_TABLET | Freq: Every day | ORAL | Status: DC
  Administered 2015-07-02 – 2015-07-03 (×2): 2 mg via ORAL
  Filled 2015-07-02 (×3): qty 1

## 2015-07-02 MED ORDER — POTASSIUM CHLORIDE CRYS ER 20 MEQ PO TBCR
40.0000 meq | EXTENDED_RELEASE_TABLET | Freq: Once | ORAL | Status: AC
  Administered 2015-07-02: 40 meq via ORAL
  Filled 2015-07-02: qty 2

## 2015-07-02 MED ORDER — PANTOPRAZOLE SODIUM 40 MG PO TBEC
40.0000 mg | DELAYED_RELEASE_TABLET | Freq: Every day | ORAL | Status: DC
  Administered 2015-07-02 – 2015-07-04 (×3): 40 mg via ORAL
  Filled 2015-07-02 (×4): qty 1

## 2015-07-02 MED ORDER — PREGABALIN 75 MG PO CAPS
75.0000 mg | ORAL_CAPSULE | Freq: Two times a day (BID) | ORAL | Status: DC
  Administered 2015-07-02 – 2015-07-04 (×5): 75 mg via ORAL
  Filled 2015-07-02 (×5): qty 1

## 2015-07-02 MED ORDER — TOPIRAMATE 100 MG PO TABS
100.0000 mg | ORAL_TABLET | Freq: Two times a day (BID) | ORAL | Status: DC
  Administered 2015-07-02 – 2015-07-04 (×5): 100 mg via ORAL
  Filled 2015-07-02 (×6): qty 1

## 2015-07-02 MED ORDER — LACTULOSE 10 GM/15ML PO SOLN
10.0000 g | Freq: Three times a day (TID) | ORAL | Status: DC | PRN
  Filled 2015-07-02: qty 15

## 2015-07-02 MED ORDER — VANCOMYCIN HCL IN DEXTROSE 1-5 GM/200ML-% IV SOLN
1000.0000 mg | Freq: Once | INTRAVENOUS | Status: AC
  Administered 2015-07-02: 1000 mg via INTRAVENOUS
  Filled 2015-07-02: qty 200

## 2015-07-02 MED ORDER — ACETAMINOPHEN 500 MG PO TABS
1000.0000 mg | ORAL_TABLET | Freq: Once | ORAL | Status: AC
  Administered 2015-07-02: 1000 mg via ORAL
  Filled 2015-07-02: qty 2

## 2015-07-02 MED ORDER — FLUOXETINE HCL 20 MG PO CAPS
20.0000 mg | ORAL_CAPSULE | Freq: Every day | ORAL | Status: DC
  Administered 2015-07-02 – 2015-07-04 (×3): 20 mg via ORAL
  Filled 2015-07-02 (×3): qty 1

## 2015-07-02 MED ORDER — PIPERACILLIN-TAZOBACTAM 3.375 G IVPB 30 MIN
3.3750 g | Freq: Once | INTRAVENOUS | Status: DC
  Filled 2015-07-02: qty 50

## 2015-07-02 MED ORDER — ADULT MULTIVITAMIN W/MINERALS CH
1.0000 | ORAL_TABLET | Freq: Every day | ORAL | Status: DC
  Administered 2015-07-02 – 2015-07-04 (×3): 1 via ORAL
  Filled 2015-07-02 (×3): qty 1

## 2015-07-02 MED ORDER — IPRATROPIUM-ALBUTEROL 0.5-2.5 (3) MG/3ML IN SOLN
3.0000 mL | RESPIRATORY_TRACT | Status: DC | PRN
  Administered 2015-07-02: 3 mL via RESPIRATORY_TRACT
  Filled 2015-07-02: qty 3

## 2015-07-02 MED ORDER — CYCLOBENZAPRINE HCL 10 MG PO TABS
10.0000 mg | ORAL_TABLET | Freq: Three times a day (TID) | ORAL | Status: DC | PRN
  Administered 2015-07-03 (×2): 10 mg via ORAL
  Filled 2015-07-02 (×2): qty 1

## 2015-07-02 MED ORDER — ONDANSETRON HCL 4 MG PO TABS: 8.0000 mg | ORAL_TABLET | Freq: Three times a day (TID) | ORAL | Status: DC | PRN

## 2015-07-02 MED ORDER — LORATADINE 10 MG PO TABS
10.0000 mg | ORAL_TABLET | Freq: Every day | ORAL | Status: DC
  Administered 2015-07-02 – 2015-07-03 (×2): 10 mg via ORAL
  Filled 2015-07-02 (×3): qty 1

## 2015-07-02 MED ORDER — OXYCODONE HCL 5 MG PO TABS
10.0000 mg | ORAL_TABLET | Freq: Four times a day (QID) | ORAL | Status: DC
  Administered 2015-07-02 – 2015-07-04 (×9): 10 mg via ORAL
  Filled 2015-07-02 (×17): qty 2

## 2015-07-02 MED ORDER — DEXTROSE 5 % IV SOLN
1.0000 g | INTRAVENOUS | Status: DC
  Administered 2015-07-03 – 2015-07-04 (×2): 1 g via INTRAVENOUS
  Filled 2015-07-02 (×2): qty 10

## 2015-07-02 MED ORDER — AZITHROMYCIN 500 MG IV SOLR
500.0000 mg | INTRAVENOUS | Status: DC
Start: 2015-07-02 — End: 2015-07-04
  Administered 2015-07-02 – 2015-07-04 (×3): 500 mg via INTRAVENOUS
  Filled 2015-07-02 (×3): qty 500

## 2015-07-02 NOTE — ED Provider Notes (Signed)
CSN: [id number]     Arrival date & time 07/02/15  0235 History   First MD Initiated Contact with Patient 07/02/15 0240     Chief Complaint  Patient presents with  . Cough  . Fever     (Consider location/radiation/quality/duration/timing/severity/associated sxs/prior Treatment) Patient is a 40 y.o. female presenting with cough and fever.  Cough Cough characteristics:  Productive Sputum characteristics:  Yellow Severity:  Moderate Onset quality:  Gradual Duration:  2 days Timing:  Intermittent Progression:  Unchanged Chronicity:  New Context: not animal exposure   Relieved by:  Nothing Worsened by:  Nothing tried Ineffective treatments:  None tried Associated symptoms: fever and wheezing   Risk factors: no recent travel   Fever Associated symptoms: cough     Past Medical History  Diagnosis Date  . Hypertension   . Migraine   . Anxiety   . Abnormal Pap smear   . Anemia   . Fibroids   . Fibromyalgia     degenerative disc disease  . Depression   . Sleep apnea   . Shortness of breath   . On home oxygen therapy     2 liters [place]  . PONV (postoperative nausea and vomiting)   . Low iron     pt states low iron-for Feraheme today in [facility]  . Morbid obesity with BMI of 45.0-49.9, adult ([place])   . Iron deficiency anemia 04/24/2012  . Microcytic anemia 11/04/2013  . Unspecified deficiency anemia 11/04/2013  . Alpha thalassemia ([place])   . Asthma     recent admission with exacerbation of asthma 09/15/11   Past Surgical History  Procedure Laterality Date  . Wisdom tooth extraction    . Mandible surgery  2008  . Carpal tunnel release    . Cesarean section      1994, 1996   . Cesarean section  11/28/2011    Procedure: CESAREAN SECTION;  Surgeon: Woodroe Mode, MD;  Location: [facility];  Service: Obstetrics;  Laterality: N/A;   Family History  Problem Relation Age of Onset  . Sickle cell trait Maternal Aunt   . Sickle cell anemia Other   . Schizophrenia Son   . Depression  Daughter    Social History  Substance Use Topics  . Smoking status: Never Smoker   . Smokeless tobacco: Never Used     Comment: Pt was exposed to 2nd hand smoke at work years ago.  . Alcohol Use: No   OB History    Gravida Para Term Preterm AB TAB SAB Ectopic Multiple Living   3 3 3       3      Review of Systems  Constitutional: Positive for fever.  Respiratory: Positive for cough and wheezing.   All other systems reviewed and are negative.     Allergies  Latex and Morphine and related  Home Medications   Prior to Admission medications   Medication Sig Start Date End Date Taking? Authorizing Provider  albuterol (PROVENTIL HFA;VENTOLIN HFA) 108 (90 BASE) MCG/ACT inhaler Inhale 2 puffs into the lungs every 6 (six) hours as needed for wheezing or shortness of breath.    Historical Provider, MD  amLODipine (NORVASC) 10 MG tablet Take 10 mg by mouth daily.    Historical Provider, MD  ARIPiprazole (ABILIFY) 2 MG tablet Take 1 tablet (2 mg total) by mouth daily. 04/19/15   Merian Capron, MD  budesonide (PULMICORT) 0.5 MG/2ML nebulizer solution Take 2 mLs (0.5 mg total) by nebulization 2 (two) times daily.  06/17/15   Tammy S Parrett, NP  clonazePAM (KLONOPIN) 0.5 MG tablet Take 1 tablet (0.5 mg total) by mouth 2 (two) times daily as needed for anxiety. 04/19/15   Merian Capron, MD  cyclobenzaprine (FLEXERIL) 10 MG tablet Take 10 mg by mouth 3 (three) times daily as needed for muscle spasms.     Historical Provider, MD  ferumoxytol Shirlean Kelly) 510 MG/17ML SOLN injection Inject 510 mg into the vein every 3 (three) months.    Historical Provider, MD  FLUoxetine (PROZAC) 20 MG capsule Take 1 capsule (20 mg total) by mouth daily. 04/19/15   Merian Capron, MD  fluticasone (FLONASE) 50 MCG/ACT nasal spray Place 2 sprays into both nostrils 2 (two) times daily.    Historical Provider, MD  furosemide (LASIX) 80 MG tablet Take 80 mg by mouth 2 (two) times daily.    Historical Provider, MD  ibuprofen  (ADVIL,MOTRIN) 800 MG tablet Take 800 mg by mouth 2 (two) times daily.    Historical Provider, MD  ipratropium-albuterol (DUONEB) 0.5-2.5 (3) MG/3ML SOLN TAKE 3 MLS BY NEBULIZATION EVERY 4 (FOUR) HOURS AS NEEDED (FOR WHEEZING). 06/09/15   Tammy S Parrett, NP  ipratropium-albuterol (DUONEB) 0.5-2.5 (3) MG/3ML SOLN Take 3 mLs by nebulization every 4 (four) hours as needed (for wheezing/shortness of breath). 06/17/15   Tammy S Parrett, NP  lactulose (CHRONULAC) 10 GM/15ML solution Take 10 g by mouth 3 (three) times daily as needed for mild constipation.    Historical Provider, MD  loratadine (CLARITIN) 10 MG tablet Take 1 tablet (10 mg total) by mouth at bedtime. 06/01/15   Tammy S Parrett, NP  metoprolol tartrate (LOPRESSOR) 25 MG tablet Take 25 mg by mouth 2 (two) times daily.    Historical Provider, MD  montelukast (SINGULAIR) 10 MG tablet Take 1 tablet (10 mg total) by mouth at bedtime. 06/01/15   Tammy S Parrett, NP  Multiple Vitamins-Minerals (MULTIVITAMIN WITH MINERALS) tablet Take 1 tablet by mouth daily.    Historical Provider, MD  ondansetron (ZOFRAN) 8 MG tablet Take 1 tablet (8 mg total) by mouth every 8 (eight) hours as needed for nausea or vomiting. 03/19/15   Lori A Clemmons, CNM  Oxycodone HCl 10 MG TABS Take 10 mg by mouth 4 (four) times daily.     Historical Provider, MD  pantoprazole (PROTONIX) 40 MG tablet TAKE 1 TABLET (40 MG TOTAL) BY MOUTH DAILY. 05/23/15   Tammy S Parrett, NP  potassium chloride SA (K-DUR,KLOR-CON) 20 MEQ tablet Take 20 mEq by mouth 2 (two) times daily.    Historical Provider, MD  pregabalin (LYRICA) 150 MG capsule Take 150 mg by mouth 2 (two) times daily. 04/16/15   Historical Provider, MD  pregabalin (LYRICA) 75 MG capsule Take 75 mg by mouth 2 (two) times daily.    Historical Provider, MD  PULMICORT 0.5 MG/2ML nebulizer solution USE 1 VIAL VIA NEBULIZER TWICE DAILY**NEED ICD10 CODE**. 06/09/15   Tammy S Parrett, NP  rizatriptan (MAXALT) 10 MG tablet Take 10 mg by mouth  as needed for migraine. May repeat in 2 hours if needed    Historical Provider, MD  topiramate (TOPAMAX) 100 MG tablet Take 100 mg by mouth 2 (two) times daily.    Historical Provider, MD   BP 125/63 mmHg  Pulse 116  Temp(Src) 102.3 F (39.1 C) (Oral)  Resp 30  Ht 5\' 1"  (1.549 m)  Wt 305 lb (138.347 kg)  BMI 57.66 kg/m2  SpO2 99% Physical Exam  Constitutional: She is oriented to person, place,  and time. She appears well-developed and well-nourished.  HENT:  Head: Normocephalic and atraumatic.  Mouth/Throat: Oropharynx is clear and moist.  Eyes: Conjunctivae are normal. Pupils are equal, round, and reactive to light.  Neck: Normal range of motion. Neck supple.  Cardiovascular: Regular rhythm.  Tachycardia present.   Pulmonary/Chest: Effort normal and breath sounds normal. No stridor. No respiratory distress. She has no wheezes. She exhibits no tenderness.  Abdominal: Soft. Bowel sounds are normal. There is no tenderness. There is no rebound and no guarding.  Musculoskeletal: Normal range of motion.  Lymphadenopathy:    She has no cervical adenopathy.  Neurological: She is alert and oriented to person, place, and time. She has normal reflexes.  Skin: Skin is warm and dry. She is not diaphoretic.  Psychiatric: She has a normal mood and affect.    ED Course  Procedures (including critical care time) Labs Review Labs Reviewed  COMPREHENSIVE METABOLIC PANEL - Abnormal; Notable for the following:    Potassium 3.1 (*)    CO2 19 (*)    Glucose, Bld 111 (*)    Calcium 8.6 (*)    AST 56 (*)    ALT 68 (*)    All other components within normal limits  CBC WITH DIFFERENTIAL/PLATELET - Abnormal; Notable for the following:    WBC 15.3 (*)    Hemoglobin 9.2 (*)    HCT 30.0 (*)    MCV 74.4 (*)    MCH 22.8 (*)    RDW 16.7 (*)    Neutro Abs 13.9 (*)    All other components within normal limits  CULTURE, BLOOD (ROUTINE X 2)  CULTURE, BLOOD (ROUTINE X 2)  URINE CULTURE  URINALYSIS,  ROUTINE W REFLEX MICROSCOPIC (NOT AT [facility])  I-STAT CG4 LACTIC ACID, ED    Imaging Review Dg Chest 2 View  07/02/2015  CLINICAL DATA:  Right-sided chest pain and dyspnea for 3 days. Febrile today. EXAM: CHEST  2 VIEW COMPARISON:  01/06/2014 FINDINGS: There is confluent consolidation of the right upper lobe inferiorly. The left lung is clear. There is no pleural effusion. Mildly prominent contours in the right hilum and mediastinum are likely due to reactive nodes. IMPRESSION: Right upper lobe infiltrate, likely pneumonia. Followup PA and lateral chest X-ray is recommended in 3-4 weeks following trial of antibiotic therapy to ensure resolution and exclude underlying malignancy. Electronically Signed   By: Andreas Newport M.D.   On: 07/02/2015 03:47   I have personally reviewed and evaluated these images and lab results as part of my medical decision-making.   EKG Interpretation None      MDM   Final diagnoses:  None    Date: 07/02/2015  Rate: 117  Rhythm:  sinus tachycardia  QRS Axis: normal  Intervals: normal  ST/T Wave abnormalities: normal  Conduction Disutrbances: none  Narrative Interpretation: sinus tachycardia   Results for orders placed or performed during the hospital encounter of 07/02/15  Comprehensive metabolic panel  Result Value Ref Range   Sodium 136 135 - 145 mmol/L   Potassium 3.1 (L) 3.5 - 5.1 mmol/L   Chloride 106 101 - 111 mmol/L   CO2 19 (L) 22 - 32 mmol/L   Glucose, Bld 111 (H) 65 - 99 mg/dL   BUN 11 6 - 20 mg/dL   Creatinine, Ser 0.94 0.44 - 1.00 mg/dL   Calcium 8.6 (L) 8.9 - 10.3 mg/dL   Total Protein 7.9 6.5 - 8.1 g/dL   Albumin 3.5 3.5 - 5.0 g/dL  AST 56 (H) 15 - 41 U/L   ALT 68 (H) 14 - 54 U/L   Alkaline Phosphatase 69 38 - 126 U/L   Total Bilirubin 0.4 0.3 - 1.2 mg/dL   GFR calc non Af Amer >60 >60 mL/min   GFR calc Af Amer >60 >60 mL/min   Anion gap 11 5 - 15  CBC WITH DIFFERENTIAL  Result Value Ref Range   WBC 15.3 (H) 4.0 - 10.5 K/uL    RBC 4.03 3.87 - 5.11 MIL/uL   Hemoglobin 9.2 (L) 12.0 - 15.0 g/dL   HCT 30.0 (L) 36.0 - 46.0 %   MCV 74.4 (L) 78.0 - 100.0 fL   MCH 22.8 (L) 26.0 - 34.0 pg   MCHC 30.7 30.0 - 36.0 g/dL   RDW 16.7 (H) 11.5 - 15.5 %   Platelets 351 150 - 400 K/uL   Neutrophils Relative % 91 %   Lymphocytes Relative 6 %   Monocytes Relative 3 %   Eosinophils Relative 0 %   Basophils Relative 0 %   Neutro Abs 13.9 (H) 1.7 - 7.7 K/uL   Lymphs Abs 0.9 0.7 - 4.0 K/uL   Monocytes Absolute 0.5 0.1 - 1.0 K/uL   Eosinophils Absolute 0.0 0.0 - 0.7 K/uL   Basophils Absolute 0.0 0.0 - 0.1 K/uL   RBC Morphology POLYCHROMASIA PRESENT    WBC Morphology TOXIC GRANULATION   I-Stat CG4 Lactic Acid, ED  (not at  [facility] [place])  Result Value Ref Range   Lactic Acid, Venous 1.55 0.5 - 2.0 mmol/L   Dg Chest 2 View  07/02/2015  CLINICAL DATA:  Right-sided chest pain and dyspnea for 3 days. Febrile today. EXAM: CHEST  2 VIEW COMPARISON:  01/06/2014 FINDINGS: There is confluent consolidation of the right upper lobe inferiorly. The left lung is clear. There is no pleural effusion. Mildly prominent contours in the right hilum and mediastinum are likely due to reactive nodes. IMPRESSION: Right upper lobe infiltrate, likely pneumonia. Followup PA and lateral chest X-ray is recommended in 3-4 weeks following trial of antibiotic therapy to ensure resolution and exclude underlying malignancy. Electronically Signed   By: Andreas Newport M.D.   On: 07/02/2015 03:47     Medications  0.9 %  sodium chloride infusion (0 mLs Intravenous Stopped 07/02/15 0435)    Followed by  0.9 %  sodium chloride infusion (1,000 mLs Intravenous New Bag/Given 07/02/15 0426)    Followed by  0.9 %  sodium chloride infusion (not administered)  vancomycin (VANCOCIN) IVPB 1000 mg/200 mL premix (not administered)  cefTRIAXone (ROCEPHIN) 1 g in dextrose 5 % 50 mL IVPB (1 g Intravenous New Bag/Given 07/02/15 0434)  acetaminophen (TYLENOL) tablet 1,000 mg (1,000 mg  Oral Given 07/02/15 0325)  ketorolac (TORADOL) 30 MG/ML injection 30 mg (30 mg Intravenous Given 07/02/15 0438)  potassium chloride SA (K-DUR,KLOR-CON) CR tablet 40 mEq (40 mEq Oral Given 07/02/15 0439)   MDM Reviewed: previous chart, nursing note and vitals Reviewed previous: labs and x-ray Interpretation: labs, ECG and x-ray (normal lactate, low potassium, anemia and elevated white count, PNA by me on CXR) Total time providing critical care: 75-105 minutes. This excludes time spent performing separately reportable procedures and services. Consults: admitting MD    CRITICAL CARE Performed by: Carlisle Beers Total critical care time: 90 minutes Critical care time was exclusive of separately billable procedures and treating other patients. Critical care was necessary to treat or prevent imminent or life-threatening deterioration. Critical care was time spent personally by me  on the following activities: development of treatment plan with patient and/or surrogate as well as nursing, discussions with consultants, evaluation of patient's response to treatment, examination of patient, obtaining history from patient or surrogate, ordering and performing treatments and interventions, ordering and review of laboratory studies, ordering and review of radiographic studies, pulse oximetry and re-evaluation of patient's condition.     Mardene Lessig, MD 07/02/15 0500

## 2015-07-02 NOTE — Progress Notes (Signed)
Pt placed on cpap 13 via full face mask, per home regimen, with 2L O2 bleed in. Pt tolerating well at this time.

## 2015-07-02 NOTE — Progress Notes (Signed)
Placed patient on CPAP on her home regimen of a pressure of 13 with a full face mask.  Patient tolerating well.

## 2015-07-02 NOTE — Progress Notes (Signed)
CRITICAL VALUE ALERT  Critical value received:  Lactic Acid 2.1   Date of notification:  07/02/15   Time of notification:  15:15    Critical value read back:Yes.    Nurse who received alert:  Virgilio Frees   MD notified (1st page):  Dr. Charlies Silvers   Time of first page:  15:16

## 2015-07-02 NOTE — ED Provider Notes (Signed)
Per Dr. Hal Hope inpatient telemetry admission  Cheyenne Manganelli, MD 07/02/15 0500

## 2015-07-02 NOTE — Plan of Care (Signed)
40 year old female presents with cough and fever is found to be wheezing chest x-ray shows infiltrate. Patient is tachycardic and meets criteria for sepsis. Patient has been admitted for sepsis secondary to pneumonia with asthma exacerbation.  [name].

## 2015-07-02 NOTE — H&P (Signed)
[facility] History and Physical  Cheyenne Arias OJJ:[id number] DOB: Mar 16, 1976 DOA: 07/02/2015  Referring physician: ER physician: Dr. Veatrice Kells  PCP: Smothers, Andree Elk, NP  Chief Complaint: cough, fever   HPI:  40 year old female with past medical history of fibromyalgia, OSA, asthma, hypertension who presented to [facility] with ongoing cough productive of yellowish sputum, fevers for past 2 days prior to this admission. Pt re[prted no recent sick contact. She has baby at home. No recent travels. No chest pain but she does have pain along the right side of rib cage which comes with cough. No reports of abdominal pian, nausea or vomiting. No lightheadedness or loss of consciousness. No diarrhea or constipation. No blood in stool or rien.   In ED, blood pressure was 84/32. It has improved to 123/80 with IV fluids. Temp was 102.57F, heart rate 128, respiratory rate 32. Blood work was significant for white blood cell count of 15.3, hemoglobin 9.2, potassium 3.1. Her creatinine was within normal limits. Lactic acid was within normal limits. Chest x-ray showed right upper lobe infiltrate likely pneumonia. She was started on azithromycin and Rocephin and transferred [place] to [facility] for further management off pneumonia.  Assessment & Plan    Principal Problem: Sepsis secondary to pneumonia ([place]) / leukocytosis - Sepsis criteria met on the admission with fever of 102.57F, tachycardia of 128, tachypnea of 32, hypotension, leukocytosis of 15.3 - Source of infection likely pneumonia as evidenced on chest x-ray. Chest x-ray on the admission showed a right upper lobe infiltrate likely pneumonia. Follow-up chest x-ray recommended in 3-4 weeks to ensure resolution and exclude underlying malignancy. - Pneumonia admission order set utilized. Order also placed for sepsis focused order set. - Follow-up blood culture results, respiratory culture results, Legionella, strep  pneumonia, HIV and influenza results - Lactic acid is within normal limits. Follow-up pro calcitonin level. - Started azithromycin and Rocephin - Use duoneb every 4 hours scheduled and every 4 hours as needed for shortness of breath and/or wheezing  - Patient is currently hemodynamically stable with blood pressure of 123/80 and heart rate 96.  Active Problems:   Fibromyalgia - Continue pain management efforts. She takes oxycodone at home. Resume on admission. - Continue Lyrica per home dose    Benign essential HTN - Patient was hypotensive on the admission with blood pressure 84/32. Blood pressure has improved to 123/80. Home blood pressure medications are on hold until we make sure that blood pressure remains stable. - At home she takes Lasix, metoprolol, Norvasc    Asthma, chronic / OSA (obstructive sleep apnea) - Order placed for DuoNeb every 4 hours scheduled and every 4 hours as needed for shortness of breath or wheezing - Resume Claritin, Singulair    Hypokalemia - Likely from Lasix. Supplemented with potassium 20 mEq twice daily    IDA (iron deficiency anemia) - Hemoglobin 9.2, stable    DVT prophylaxis:   - SCD's bilaterally   Radiological Exams on Admission: Dg Chest 2 View 07/02/2015  Right upper lobe infiltrate, likely pneumonia. Followup PA and lateral chest X-ray is recommended in 3-4 weeks following trial of antibiotic therapy to ensure resolution and exclude underlying malignancy. Electronically Signed   By: Andreas Newport M.D.   On: 07/02/2015 03:47    EKG: I have personally reviewed EKG. EKG shows sinus tachycardia   Code Status: Full Family Communication: Plan of care discussed with the patient  Disposition Plan: Admit for further evaluation, telemetry unit  due to admission for sepsis   Leisa Lenz, MD  [facility] Pager [phone]  Time spent in minutes: 75 minutes  Review of Systems:  Constitutional: Negative for fever, chills and  malaise/fatigue. Negative for diaphoresis.  HENT: Negative for hearing loss, ear pain, nosebleeds, congestion, sore throat, neck pain, tinnitus and ear discharge.   Eyes: Negative for blurred vision, double vision, photophobia, pain, discharge and redness.  Respiratory: per HPI Cardiovascular: Negative for chest pain, palpitations, orthopnea, claudication and leg swelling.  Gastrointestinal: Negative for nausea, vomiting and abdominal pain. Negative for heartburn, constipation, blood in stool and melena.  Genitourinary: Negative for dysuria, urgency, frequency, hematuria and flank pain.  Musculoskeletal: Negative for myalgias, back pain, joint pain and falls.  Skin: Negative for itching and rash.  Neurological: Negative for dizziness and weakness. Negative for tingling, tremors, sensory change, speech change, focal weakness, loss of consciousness and headaches.  Endo/Heme/Allergies: Negative for environmental allergies and polydipsia. Does not bruise/bleed easily.  Psychiatric/Behavioral: Negative for suicidal ideas. The patient is not nervous/anxious.      Past Medical History  Diagnosis Date  . Hypertension   . Migraine   . Anxiety   . Abnormal Pap smear   . Anemia   . Fibroids   . Fibromyalgia     degenerative disc disease  . Depression   . Sleep apnea   . Shortness of breath   . On home oxygen therapy     2 liters [place]  . PONV (postoperative nausea and vomiting)   . Low iron     pt states low iron-for Feraheme today in [facility]U  . Morbid obesity with BMI of 45.0-49.9, adult ([place])   . Iron deficiency anemia 04/24/2012  . Microcytic anemia 11/04/2013  . Unspecified deficiency anemia 11/04/2013  . Alpha thalassemia ([place])   . Asthma     recent admission with exacerbation of asthma 09/15/11   Past Surgical History  Procedure Laterality Date  . Wisdom tooth extraction    . Mandible surgery  2008  . Carpal tunnel release    . Cesarean section      1994, 1996   . Cesarean section   11/28/2011    Procedure: CESAREAN SECTION;  Surgeon: Woodroe Mode, MD;  Location: [facility];  Service: Obstetrics;  Laterality: N/A;   Social History:  reports that she has never smoked. She has never used smokeless tobacco. She reports that she does not drink alcohol or use illicit drugs.  Allergies  Allergen Reactions  . Latex Itching, Swelling and Other (See Comments)    Reaction:  Blisters   . Morphine And Related Itching    Family History:  Family History  Problem Relation Age of Onset  . Sickle cell trait Maternal Aunt   . Sickle cell anemia Other   . Schizophrenia Son   . Depression Daughter      Prior to Admission medications   Medication Sig Start Date End Date Taking? Authorizing Provider  albuterol (PROVENTIL HFA;VENTOLIN HFA) 108 (90 BASE) MCG/ACT inhaler Inhale 2 puffs into the lungs every 6 (six) hours as needed for wheezing or shortness of breath.    Historical Provider, MD  amLODipine (NORVASC) 10 MG tablet Take 10 mg by mouth daily.    Historical Provider, MD  ARIPiprazole (ABILIFY) 2 MG tablet Take 1 tablet (2 mg total) by mouth daily. 04/19/15   Merian Capron, MD  budesonide (PULMICORT) 0.5 MG/2ML nebulizer solution Take 2 mLs (0.5 mg total) by nebulization 2 (two) times daily.  06/17/15   Tammy S Parrett, NP  clonazePAM (KLONOPIN) 0.5 MG tablet Take 1 tablet (0.5 mg total) by mouth 2 (two) times daily as needed for anxiety. 04/19/15   Merian Capron, MD  cyclobenzaprine (FLEXERIL) 10 MG tablet Take 10 mg by mouth 3 (three) times daily as needed for muscle spasms.     Historical Provider, MD  ferumoxytol Shirlean Kelly) 510 MG/17ML SOLN injection Inject 510 mg into the vein every 3 (three) months.    Historical Provider, MD  FLUoxetine (PROZAC) 20 MG capsule Take 1 capsule (20 mg total) by mouth daily. 04/19/15   Merian Capron, MD  fluticasone (FLONASE) 50 MCG/ACT nasal spray Place 2 sprays into both nostrils 2 (two) times daily.    Historical Provider, MD  furosemide (LASIX) 80 MG  tablet Take 80 mg by mouth 2 (two) times daily.    Historical Provider, MD  ibuprofen (ADVIL,MOTRIN) 800 MG tablet Take 800 mg by mouth 2 (two) times daily.    Historical Provider, MD  ipratropium-albuterol (DUONEB) 0.5-2.5 (3) MG/3ML SOLN TAKE 3 MLS BY NEBULIZATION EVERY 4 (FOUR) HOURS AS NEEDED (FOR WHEEZING). 06/09/15   Tammy S Parrett, NP  ipratropium-albuterol (DUONEB) 0.5-2.5 (3) MG/3ML SOLN Take 3 mLs by nebulization every 4 (four) hours as needed (for wheezing/shortness of breath). 06/17/15   Tammy S Parrett, NP  lactulose (CHRONULAC) 10 GM/15ML solution Take 10 g by mouth 3 (three) times daily as needed for mild constipation.    Historical Provider, MD  loratadine (CLARITIN) 10 MG tablet Take 1 tablet (10 mg total) by mouth at bedtime. 06/01/15   Tammy S Parrett, NP  metoprolol tartrate (LOPRESSOR) 25 MG tablet Take 25 mg by mouth 2 (two) times daily.    Historical Provider, MD  montelukast (SINGULAIR) 10 MG tablet Take 1 tablet (10 mg total) by mouth at bedtime. 06/01/15   Tammy S Parrett, NP  Multiple Vitamins-Minerals (MULTIVITAMIN WITH MINERALS) tablet Take 1 tablet by mouth daily.    Historical Provider, MD  ondansetron (ZOFRAN) 8 MG tablet Take 1 tablet (8 mg total) by mouth every 8 (eight) hours as needed for nausea or vomiting. 03/19/15   Lori A Clemmons, CNM  Oxycodone HCl 10 MG TABS Take 10 mg by mouth 4 (four) times daily.     Historical Provider, MD  pantoprazole (PROTONIX) 40 MG tablet TAKE 1 TABLET (40 MG TOTAL) BY MOUTH DAILY. 05/23/15   Tammy S Parrett, NP  potassium chloride SA (K-DUR,KLOR-CON) 20 MEQ tablet Take 20 mEq by mouth 2 (two) times daily.    Historical Provider, MD  pregabalin (LYRICA) 150 MG capsule Take 150 mg by mouth 2 (two) times daily. 04/16/15   Historical Provider, MD  pregabalin (LYRICA) 75 MG capsule Take 75 mg by mouth 2 (two) times daily.    Historical Provider, MD  PULMICORT 0.5 MG/2ML nebulizer solution USE 1 VIAL VIA NEBULIZER TWICE DAILY**NEED ICD10 CODE**.  06/09/15   Tammy S Parrett, NP  rizatriptan (MAXALT) 10 MG tablet Take 10 mg by mouth as needed for migraine. May repeat in 2 hours if needed    Historical Provider, MD  topiramate (TOPAMAX) 100 MG tablet Take 100 mg by mouth 2 (two) times daily.    Historical Provider, MD   Physical Exam: Filed Vitals:   07/02/15 0715 07/02/15 0715 07/02/15 0745 07/02/15 0821  BP: 123/73 128/69 123/80   Pulse:  96    Temp:    99.4 F (37.4 C)  TempSrc:    Oral  Resp: 32  17   Height:      Weight:      SpO2:        Physical Exam  Constitutional: Appears well-developed and well-nourished. No distress.  HENT: Normocephalic. No tonsillar erythema or exudates Eyes: Conjunctivae and EOM are normal. PERRLA, no scleral icterus.  Neck: Normal ROM. Neck supple. No JVD. No tracheal deviation. No thyromegaly.  CVS: RRR, S1/S2 +, no murmurs, no gallops, no carotid bruit.  Pulmonary: Effort and breath sounds normal, no stridor, rhonchi, wheezes, rales.  Abdominal: Soft. BS +,  no distension, tenderness, rebound or guarding.  Musculoskeletal: Normal range of motion. No edema and no tenderness.  Lymphadenopathy: No lymphadenopathy noted, cervical, inguinal. Neuro: Alert. Normal reflexes, muscle tone coordination. No focal neurologic deficits. Skin: Skin is warm and dry. No rash noted.  No erythema. No pallor.  Psychiatric: Normal mood and affect. Behavior, judgment, thought content normal.   Labs on Admission:  Basic Metabolic Panel:  Recent Labs Lab 07/02/15 0310  NA 136  K 3.1*  CL 106  CO2 19*  GLUCOSE 111*  BUN 11  CREATININE 0.94  CALCIUM 8.6*   Liver Function Tests:  Recent Labs Lab 07/02/15 0310  AST 56*  ALT 68*  ALKPHOS 69  BILITOT 0.4  PROT 7.9  ALBUMIN 3.5   No results for input(s): LIPASE, AMYLASE in the last 168 hours. No results for input(s): AMMONIA in the last 168 hours. CBC:  Recent Labs Lab 07/02/15 0310  WBC 15.3*  NEUTROABS 13.9*  HGB 9.2*  HCT 30.0*  MCV  74.4*  PLT 351   Cardiac Enzymes: No results for input(s): CKTOTAL, CKMB, CKMBINDEX, TROPONINI in the last 168 hours. BNP: Invalid input(s): POCBNP CBG: No results for input(s): GLUCAP in the last 168 hours.  If 7PM-7AM, please contact night-coverage [email or web address] Password [facility] 07/02/2015, 9:32 AM

## 2015-07-02 NOTE — Progress Notes (Signed)
Patient admitted to [facility], room [id number].  Transferred via [place].  Report received from [place] ED nurse.  Patient oriented to staff, unit, and room.  Alert and oriented, no acute distress.  No family at bedside.  Vitals obtained and patient assessed.  Placed on telemetry box 53 and CCMD notified.  IV fluids through left forearm IV infusing at 125/hr.

## 2015-07-02 NOTE — Progress Notes (Signed)
Dr. Charlies Silvers to be admitting MD.

## 2015-07-03 DIAGNOSIS — J09X1 Influenza due to identified novel influenza A virus with pneumonia: Secondary | ICD-10-CM

## 2015-07-03 LAB — BASIC METABOLIC PANEL
ANION GAP: 6 (ref 5–15)
BUN: 10 mg/dL (ref 6–20)
CALCIUM: 8.2 mg/dL — AB (ref 8.9–10.3)
CO2: 22 mmol/L (ref 22–32)
Chloride: 111 mmol/L (ref 101–111)
Creatinine, Ser: 0.69 mg/dL (ref 0.44–1.00)
GLUCOSE: 115 mg/dL — AB (ref 65–99)
Potassium: 3.6 mmol/L (ref 3.5–5.1)
Sodium: 139 mmol/L (ref 135–145)

## 2015-07-03 LAB — CBC
HCT: 30 % — ABNORMAL LOW (ref 36.0–46.0)
HEMOGLOBIN: 8.9 g/dL — AB (ref 12.0–15.0)
MCH: 22.6 pg — ABNORMAL LOW (ref 26.0–34.0)
MCHC: 29.7 g/dL — AB (ref 30.0–36.0)
MCV: 76.3 fL — ABNORMAL LOW (ref 78.0–100.0)
Platelets: 342 10*3/uL (ref 150–400)
RBC: 3.93 MIL/uL (ref 3.87–5.11)
RDW: 16.9 % — ABNORMAL HIGH (ref 11.5–15.5)
WBC: 20.1 10*3/uL — ABNORMAL HIGH (ref 4.0–10.5)

## 2015-07-03 LAB — HIV ANTIBODY (ROUTINE TESTING W REFLEX): HIV Screen 4th Generation wRfx: NONREACTIVE

## 2015-07-03 LAB — STREP PNEUMONIAE URINARY ANTIGEN: Strep Pneumo Urinary Antigen: NEGATIVE

## 2015-07-03 MED ORDER — OSELTAMIVIR PHOSPHATE 75 MG PO CAPS: 75.0000 mg | ORAL_CAPSULE | Freq: Every day | ORAL | Status: DC

## 2015-07-03 MED ORDER — GUAIFENESIN ER 600 MG PO TB12
600.0000 mg | ORAL_TABLET | Freq: Two times a day (BID) | ORAL | Status: DC
  Administered 2015-07-03 – 2015-07-04 (×3): 600 mg via ORAL
  Filled 2015-07-03 (×5): qty 1

## 2015-07-03 MED ORDER — OSELTAMIVIR PHOSPHATE 75 MG PO CAPS
75.0000 mg | ORAL_CAPSULE | Freq: Two times a day (BID) | ORAL | Status: DC
Start: 2015-07-03 — End: 2015-07-04
  Administered 2015-07-03 – 2015-07-04 (×3): 75 mg via ORAL
  Filled 2015-07-03 (×4): qty 1

## 2015-07-03 NOTE — Progress Notes (Signed)
Utilization Review Completed.Cheyenne Arias T3/19/2017  

## 2015-07-03 NOTE — Progress Notes (Signed)
Patient ID: Cheyenne Arias, female   DOB: November 11, 1975, 40 y.o.   MRN: [id number] [facility] PROGRESS NOTE  ZULEY LUTTER JKK:[id number] DOB: 04-26-1975 DOA: 07/02/2015 PCP: Junie Panning, NP  Brief narrative:    39 year old female with past medical history of fibromyalgia, OSA, asthma, hypertension who presented to [facility] with ongoing cough productive of yellowish sputum, fevers (Tmax 102.58F) for past 2 days prior to this admission. She also had right side rib cage pain tat is worse with cough.  In ED, blood pressure was 84/32. It has improved to 123/80 with IV fluids. Temp was 102.39F, heart rate 128, respiratory rate 32. Blood work was significant for white blood cell count of 15.3, hemoglobin 9.2, potassium 3.1. Her creatinine was within normal limits. Lactic acid was within normal limits. Chest x-ray showed right upper lobe infiltrate likely pneumonia. She was started on azithromycin and Rocephin. Her influenza panel is positive for Influenza A and she was started on Tamiflu 3/19.  Assessment/Plan:    Principal Problem: Sepsis secondary to Influenza A pneumonia ([place]) / leukocytosis - Sepsis criteria met on the admission with fever of 102.39F, tachycardia of 128, tachypnea of 32, hypotension, leukocytosis of 15.3 - Source of infection is Influenza A pneumonia since her influenza A is positive. Started tamiflu 3/19. - Follow-up chest x-ray recommended in 3-4 weeks to ensure resolution and exclude underlying malignancy. - Legionella, Resp virus panel and blood cultures pending - Strep pneumonia and HIV are negative  - Continue azithromycin and Rocephin  Active Problems:  Fibromyalgia - Continue Lyrica - Continue pain management efforts    Benign essential HTN - Patient was hypotensive on the admission with blood pressure 84/32. Blood pressure has improved to 123/80.  - BP now 119/82, continue to hold BP meds (Lasix, metoprolol, Norvasc)   Asthma, chronic / OSA  (obstructive sleep apnea) - At home takes oxygen at night - Continue DuoNeb every 4 hours scheduled and every 4 hours as needed for shortness of breath or wheezing - Continue Claritin, Singulair   Hypokalemia - Likely from Lasix. Supplemented. - Potassium WNL   IDA (iron deficiency anemia) - Hemoglobin 9.2, stable - No current indications for transfusion   DVT prophylaxis:  - SCD's bilaterally    Code Status: Full.  Family Communication:  plan of care discussed with the patient Disposition Plan: 3/20 if she feels better.   IV access:  Peripheral IV  Procedures and diagnostic studies:    Dg Chest 2 View 07/02/2015  Right upper lobe infiltrate, likely pneumonia. Followup PA and lateral chest X-ray is recommended in 3-4 weeks following trial of antibiotic therapy to ensure resolution and exclude underlying malignancy. Electronically Signed   By: Andreas Newport M.D.   On: 07/02/2015 03:47    Medical Consultants:  None   Other Consultants:  None   IAnti-Infectives:   Azithro abd rocephin 07/02/2015 --> Tamiflu 07/03/2015 -->   Cheyenne Lenz, MD  [facility] Pager [phone]  Time spent in minutes: 25 minutes  If 7PM-7AM, please contact night-coverage [email or web address] Password [facility] 07/03/2015, 11:52 AM   LOS: 1 day    HPI/Subjective: No acute overnight events. Patient reports feeling little better.   Objective: Filed Vitals:   07/02/15 2046 07/02/15 2136 07/03/15 0503 07/03/15 1104  BP:  111/65 119/82   Pulse:  112 113   Temp:  98.9 F (37.2 C) 99.6 F (37.6 C)   TempSrc:  Oral Axillary   Resp:  20 22   Height:  Weight:      SpO2: 97% 99% 96% 98%    Intake/Output Summary (Last 24 hours) at 07/03/15 1152 Last data filed at 07/03/15 0504  Gross per 24 hour  Intake    240 ml  Output      0 ml  Net    240 ml    Exam:   General:  Pt is alert, follows commands appropriately, not in acute distress  Cardiovascular: Tachycardic, S1/S2  appreciated   Respiratory: Diminished breath sounds but no wheezing   Abdomen: Soft, non tender, non distended, bowel sounds present  Extremities: No edema, pulses palpable bilaterally  Neuro: Grossly nonfocal  Data Reviewed: Basic Metabolic Panel:  Recent Labs Lab 07/02/15 0310 07/03/15 0641  NA 136 139  K 3.1* 3.6  CL 106 111  CO2 19* 22  GLUCOSE 111* 115*  BUN 11 10  CREATININE 0.94 0.69  CALCIUM 8.6* 8.2*   Liver Function Tests:  Recent Labs Lab 07/02/15 0310  AST 56*  ALT 68*  ALKPHOS 69  BILITOT 0.4  PROT 7.9  ALBUMIN 3.5   No results for input(s): LIPASE, AMYLASE in the last 168 hours. No results for input(s): AMMONIA in the last 168 hours. CBC:  Recent Labs Lab 07/02/15 0310 07/03/15 0641  WBC 15.3* 20.1*  NEUTROABS 13.9*  --   HGB 9.2* 8.9*  HCT 30.0* 30.0*  MCV 74.4* 76.3*  PLT 351 342   Cardiac Enzymes: No results for input(s): CKTOTAL, CKMB, CKMBINDEX, TROPONINI in the last 168 hours. BNP: Invalid input(s): POCBNP CBG: No results for input(s): GLUCAP in the last 168 hours.  No results found for this or any previous visit (from the past 240 hour(s)).   Scheduled Meds: . ARIPiprazole  2 mg Oral Daily  . azithromycin  500 mg Intravenous Q24H  . cefTRIAXone (ROCEPHIN)  IV  1 g Intravenous Q24H  . FLUoxetine  20 mg Oral Daily  . fluticasone  2 spray Each Nare BID  . guaiFENesin  600 mg Oral BID  . ipratropium-albuterol  3 mL Nebulization QID  . loratadine  10 mg Oral QHS  . montelukast  10 mg Oral QHS  . multivitamin with minerals  1 tablet Oral Daily  . oseltamivir  75 mg Oral BID  . oxyCODONE  10 mg Oral QID  . pantoprazole  40 mg Oral Daily  . potassium chloride SA  20 mEq Oral BID  . pregabalin  75 mg Oral BID  . topiramate  100 mg Oral BID   Continuous Infusions:

## 2015-07-04 ENCOUNTER — Inpatient Hospital Stay (HOSPITAL_COMMUNITY): Payer: Medicare Other

## 2015-07-04 LAB — URINE CULTURE

## 2015-07-04 MED ORDER — IPRATROPIUM-ALBUTEROL 0.5-2.5 (3) MG/3ML IN SOLN
3.0000 mL | Freq: Three times a day (TID) | RESPIRATORY_TRACT | Status: DC
  Administered 2015-07-04: 3 mL via RESPIRATORY_TRACT
  Filled 2015-07-04: qty 3

## 2015-07-04 MED ORDER — OSELTAMIVIR PHOSPHATE 75 MG PO CAPS: 75.0000 mg | ORAL_CAPSULE | Freq: Two times a day (BID) | ORAL | Status: DC

## 2015-07-04 MED ORDER — PREDNISONE 50 MG PO TABS: 50.0000 mg | ORAL_TABLET | Freq: Every day | ORAL | Status: DC

## 2015-07-04 MED ORDER — AMOXICILLIN-POT CLAVULANATE 125-31.25 MG/5ML PO SUSR: 250.0000 mg | Freq: Two times a day (BID) | ORAL | Status: DC

## 2015-07-04 MED ORDER — PREDNISONE 50 MG PO TABS
50.0000 mg | ORAL_TABLET | Freq: Every day | ORAL | Status: DC
  Administered 2015-07-04: 50 mg via ORAL
  Filled 2015-07-04 (×2): qty 1

## 2015-07-04 NOTE — Discharge Instructions (Signed)

## 2015-07-04 NOTE — Discharge Summary (Signed)
Physician Discharge Summary  Cheyenne Arias JSE:[id number] DOB: 1975/05/16 DOA: 07/02/2015  PCP: Cheyenne Panning, NP  Admit date: 07/02/2015 Discharge date: 07/04/2015  Recommendations for Outpatient Follow-up:  Take Tamiflu for 4 more days on discharge for viral pneumonia, specifically influenza.  Take Augmentin twice daily for 7 days on discharge for bacterial pneumonia. You will need follow-up chest x-ray in 3 weeks to ensure resolution of pneumonia. Please follow-up with primary care physician to make sure you have a repeat chest x-ray is recommended.  Discharge Diagnoses:  Principal Problem:   Sepsis due to pneumonia [facility]) Active Problems:   Fibromyalgia   Benign essential HTN   Asthma, chronic   OSA (obstructive sleep apnea)   Hypokalemia   Leukocytosis   IDA (iron deficiency anemia)    Discharge Condition: stable; please note patient is insisting on going home today. She has 60-year-old son and a no one to take care of him beyond today so I have instructed her to continue current antibiotic Augmentin in addition to Tamiflu. We gave her masks to wear around her kids and around people until she completes Tamiflu. She was also instructed to go and see primary care physician to follow-up on a repeat chest x-ray to make sure pneumonia is resolving. Risks of going home versus receiving treatment in hospital explained to her. I have provided my cell phone number in case she does not feel better.  Diet recommendation: as tolerated   History of present illness:  40 year old female with past medical history of fibromyalgia, OSA, asthma, hypertension who presented to [facility] with ongoing cough productive of yellowish sputum, fevers (Tmax 102.69F) for past 2 days prior to this admission. She also had right side rib cage pain tat is worse with cough.  In ED, blood pressure was 84/32. It has improved to 123/80 with IV fluids. Temp was 102.65F, heart rate 128, respiratory rate 32. Blood work  was significant for white blood cell count of 15.3, hemoglobin 9.2, potassium 3.1. Her creatinine was within normal limits. Lactic acid was within normal limits. Chest x-ray showed right upper lobe infiltrate likely pneumonia. She was started on azithromycin and Rocephin. Her influenza panel is positive for Influenza A and she was started on Tamiflu 3/19.  Hospital Course:    Assessment/Plan:    Principal Problem: Sepsis secondary to Influenza A pneumonia ([name]) / leukocytosis - Sepsis criteria met on the admission with fever of 102.65F, tachycardia of 128, tachypnea of 32, hypotension, leukocytosis of 15.3 - Source of infection is Influenza A pneumonia since her influenza A is positive. Started tamiflu 3/19. She will continue Tamiflu for 4 more days on discharge - Repeat chest x-ray still significant for right upper lobe infiltrate and repeat chest x-ray in 3-4 weeks is recommended to ensure resolution of pneumonia - Blood cultures, strep pneumonia, HIV all negative - Legionella needs to be followed up on outpatient basis - Will stop azithromycin and Rocephin prior to discharge and she can continue Augmentin for 7 days on discharge 10 mL every 12 hours. Patient prefers solution to capsules.  Active Problems:  Fibromyalgia - Continue Lyrica   Benign essential HTN - May resume home blood pressure medications and discharge   Asthma, chronic / OSA (obstructive sleep apnea) - At home takes oxygen at night - Continue inhaler and nebulizer treatments per home regimen   Hypokalemia - Likely from Lasix. Supplemented. - Potassium WNL   IDA (iron deficiency anemia) - Hemoglobin 9.2, stable - No current indications for  transfusion   DVT prophylaxis:  - SCD's bilaterally in hospital    Code Status: Full.  Family Communication: plan of care discussed with the patient   IV access:  Peripheral IV  Procedures and diagnostic studies:   Dg Chest 2 View 07/02/2015 Right  upper lobe infiltrate, likely pneumonia. Followup PA and lateral chest X-ray is recommended in 3-4 weeks following trial of antibiotic therapy to ensure resolution and exclude underlying malignancy. Electronically Signed By: Andreas Newport M.D. On: 07/02/2015 03:47    Medical Consultants:  None   Other Consultants:  None   IAnti-Infectives:   Azithro abd rocephin 07/02/2015 --> 07/04/2015  Tamiflu 07/03/2015 --> for 4 more days on discharge    Signed:  Leisa Lenz, MD  [facility] 07/04/2015, 10:54 AM  Pager #: [phone]  Time spent in minutes: more than 30 minutes  Discharge Exam: Filed Vitals:   07/03/15 2128 07/04/15 0630  BP: 129/78 141/72  Pulse: 113 106  Temp: 98.7 F (37.1 C) 99.1 F (37.3 C)  Resp: 24 20   Filed Vitals:   07/03/15 2042 07/03/15 2128 07/04/15 0630 07/04/15 0825  BP:  129/78 141/72   Pulse:  113 106   Temp:  98.7 F (37.1 C) 99.1 F (37.3 C)   TempSrc:  Oral Oral   Resp:  24 20   Height:      Weight:      SpO2: 96% 99% 97% 98%    General: Pt is alert, follows commands appropriately, not in acute distress Cardiovascular: Regular rate and rhythm, S1/S2 +, no murmurs Respiratory: Clear to auscultation bilaterally, no wheezing, no crackles, no rhonchi Abdominal: Soft, non tender, non distended, bowel sounds +, no guarding Extremities: no edema, no cyanosis, pulses palpable bilaterally DP and PT Neuro: Grossly nonfocal  Discharge Instructions  Discharge Instructions    Call MD for:  difficulty breathing, headache or visual disturbances    Complete by:  As directed      Call MD for:  persistant dizziness or light-headedness    Complete by:  As directed      Call MD for:  persistant nausea and vomiting    Complete by:  As directed      Call MD for:  severe uncontrolled pain    Complete by:  As directed      Diet - low sodium heart healthy    Complete by:  As directed      Discharge instructions    Complete by:   As directed   Take Tamiflu for 4 more days on discharge for viral pneumonia, specifically influenza.  Take Augmentin twice daily for 7 days on discharge for bacterial pneumonia. You will need follow-up chest x-ray in 3 weeks to ensure resolution of pneumonia. Please follow-up with primary care physician to make sure you have a repeat chest x-ray is recommended.     Increase activity slowly    Complete by:  As directed             Medication List    TAKE these medications        albuterol 108 (90 Base) MCG/ACT inhaler  Commonly known as:  PROVENTIL HFA;VENTOLIN HFA  Inhale 2 puffs into the lungs every 6 (six) hours as needed for wheezing or shortness of breath.     amLODipine 10 MG tablet  Commonly known as:  NORVASC  Take 10 mg by mouth daily.     amoxicillin-clavulanate 125-31.25 MG/5ML suspension  Commonly known as:  AUGMENTIN  Take 10 mLs (250 mg total) by mouth 2 (two) times daily.     ARIPiprazole 2 MG tablet  Commonly known as:  ABILIFY  Take 1 tablet (2 mg total) by mouth daily.     budesonide 0.5 MG/2ML nebulizer solution  Commonly known as:  PULMICORT  Take 2 mLs (0.5 mg total) by nebulization 2 (two) times daily.     clonazePAM 0.5 MG tablet  Commonly known as:  KLONOPIN  Take 1 tablet (0.5 mg total) by mouth 2 (two) times daily as needed for anxiety.     cyclobenzaprine 10 MG tablet  Commonly known as:  FLEXERIL  Take 10 mg by mouth 3 (three) times daily as needed for muscle spasms.     FERAHEME 510 MG/17ML Soln injection  Generic drug:  ferumoxytol  Inject 510 mg into the vein every 3 (three) months.     FLUoxetine 20 MG capsule  Commonly known as:  PROZAC  Take 1 capsule (20 mg total) by mouth daily.     fluticasone 50 MCG/ACT nasal spray  Commonly known as:  FLONASE  Place 2 sprays into both nostrils 2 (two) times daily.     furosemide 80 MG tablet  Commonly known as:  LASIX  Take 80 mg by mouth 2 (two) times daily.     ibuprofen 800 MG tablet   Commonly known as:  ADVIL,MOTRIN  Take 800 mg by mouth 2 (two) times daily.     ipratropium-albuterol 0.5-2.5 (3) MG/3ML Soln  Commonly known as:  DUONEB  Take 3 mLs by nebulization every 4 (four) hours as needed (for wheezing/shortness of breath).     lactulose 10 GM/15ML solution  Commonly known as:  CHRONULAC  Take 10 g by mouth 3 (three) times daily as needed for mild constipation.     loratadine 10 MG tablet  Commonly known as:  CLARITIN  Take 1 tablet (10 mg total) by mouth at bedtime.     medroxyPROGESTERone 10 MG tablet  Commonly known as:  PROVERA  Take 10 mg by mouth every 6 (six) hours as needed. Menstrual bleeding     metoprolol tartrate 25 MG tablet  Commonly known as:  LOPRESSOR  Take 25 mg by mouth every morning.     montelukast 10 MG tablet  Commonly known as:  SINGULAIR  Take 1 tablet (10 mg total) by mouth at bedtime.     multivitamin with minerals tablet  Take 1 tablet by mouth daily.     ondansetron 8 MG tablet  Commonly known as:  ZOFRAN  Take 1 tablet (8 mg total) by mouth every 8 (eight) hours as needed for nausea or vomiting.     oseltamivir 75 MG capsule  Commonly known as:  TAMIFLU  Take 1 capsule (75 mg total) by mouth 2 (two) times daily.     oxyCODONE 15 MG immediate release tablet  Commonly known as:  ROXICODONE  Take 15 mg by mouth every 6 (six) hours.     pantoprazole 40 MG tablet  Commonly known as:  PROTONIX  TAKE 1 TABLET (40 MG TOTAL) BY MOUTH DAILY.     potassium chloride SA 20 MEQ tablet  Commonly known as:  K-DUR,KLOR-CON  Take 20 mEq by mouth 2 (two) times daily.     predniSONE 50 MG tablet  Commonly known as:  DELTASONE  Take 1 tablet (50 mg total) by mouth daily with breakfast.     pregabalin 75 MG capsule  Commonly known as:  LYRICA  Take 225 mg by mouth 2 (two) times daily.     rizatriptan 10 MG tablet  Commonly known as:  MAXALT  Take 10 mg by mouth as needed for migraine. May repeat in 2 hours if needed      topiramate 100 MG tablet  Commonly known as:  TOPAMAX  Take 100 mg by mouth 2 (two) times daily.           Follow-up Information    Follow up with Smothers, Andree Elk, NP. Schedule an appointment as soon as possible for a visit in 1 week.   Specialty:  Nurse Practitioner   Why:  Follow up appt after recent hospitalization   Contact information:   [address] [place] [address] [phone]        The results of significant diagnostics from this hospitalization (including imaging, microbiology, ancillary and laboratory) are listed below for reference.    Significant Diagnostic Studies: Dg Chest 2 View  07/02/2015  CLINICAL DATA:  Right-sided chest pain and dyspnea for 3 days. Febrile today. EXAM: CHEST  2 VIEW COMPARISON:  01/06/2014 FINDINGS: There is confluent consolidation of the right upper lobe inferiorly. The left lung is clear. There is no pleural effusion. Mildly prominent contours in the right hilum and mediastinum are likely due to reactive nodes. IMPRESSION: Right upper lobe infiltrate, likely pneumonia. Followup PA and lateral chest X-ray is recommended in 3-4 weeks following trial of antibiotic therapy to ensure resolution and exclude underlying malignancy. Electronically Signed   By: Andreas Newport M.D.   On: 07/02/2015 03:47   Dg Chest Port 1 View  07/04/2015  CLINICAL DATA:  Pneumonia. EXAM: PORTABLE CHEST 1 VIEW COMPARISON:  07/02/2015. FINDINGS: Right upper lobe atelectasis and/or infiltrate. Cardiomegaly with normal pulmonary vascularity. No pleural effusion or pneumothorax. IMPRESSION: Right upper lobe atelectasis and/or infiltrate again noted. No significant interim clearing from prior exam. Followup PA and lateral chest X-ray is recommended in 3-4 weeks following trial of antibiotic therapy to ensure resolution and exclude underlying malignancy. Electronically Signed   By: Marcello Moores  Register   On: 07/04/2015 10:29    Microbiology: Recent  Results (from the past 240 hour(s))  Blood Culture (routine x 2)     Status: None (Preliminary result)   Collection Time: 07/02/15  3:10 AM  Result Value Ref Range Status   Specimen Description BLOOD ARM RIGHT  Final   Special Requests   Final    BOTTLES DRAWN AEROBIC AND ANAEROBIC AER 5CC ANA 5CC   Culture   Final    NO GROWTH 1 DAY Performed at [facility]    Report Status PENDING  Incomplete  Blood Culture (routine x 2)     Status: None (Preliminary result)   Collection Time: 07/02/15  3:15 AM  Result Value Ref Range Status   Specimen Description BLOOD LEFT ANTECUBITAL  Final   Special Requests   Final    BOTTLES DRAWN AEROBIC AND ANAEROBIC AER 5CC ANA 5CC   Culture   Final    NO GROWTH 1 DAY Performed at [facility]    Report Status PENDING  Incomplete  Culture, blood (x 2)     Status: None (Preliminary result)   Collection Time: 07/02/15 10:15 AM  Result Value Ref Range Status   Specimen Description BLOOD RIGHT ANTECUBITAL  Final   Special Requests BOTTLES DRAWN AEROBIC AND ANAEROBIC 5 CC EACH  Final   Culture   Final    NO GROWTH <  24 HOURS Performed at [facility]    Report Status PENDING  Incomplete  Culture, blood (routine x 2) Call MD if unable to obtain prior to antibiotics being given     Status: None (Preliminary result)   Collection Time: 07/02/15 10:28 AM  Result Value Ref Range Status   Specimen Description BLOOD BLOOD LEFT FOREARM  Final   Special Requests BOTTLES DRAWN AEROBIC AND ANAEROBIC 7 CC EACH  Final   Culture   Final    NO GROWTH < 24 HOURS Performed at [facility]    Report Status PENDING  Incomplete     Labs: Basic Metabolic Panel:  Recent Labs Lab 07/02/15 0310 07/03/15 0641  NA 136 139  K 3.1* 3.6  CL 106 111  CO2 19* 22  GLUCOSE 111* 115*  BUN 11 10  CREATININE 0.94 0.69  CALCIUM 8.6* 8.2*   Liver Function Tests:  Recent Labs Lab 07/02/15 0310  AST 56*  ALT 68*  ALKPHOS 69  BILITOT 0.4   PROT 7.9  ALBUMIN 3.5   No results for input(s): LIPASE, AMYLASE in the last 168 hours. No results for input(s): AMMONIA in the last 168 hours. CBC:  Recent Labs Lab 07/02/15 0310 07/03/15 0641  WBC 15.3* 20.1*  NEUTROABS 13.9*  --   HGB 9.2* 8.9*  HCT 30.0* 30.0*  MCV 74.4* 76.3*  PLT 351 342   Cardiac Enzymes: No results for input(s): CKTOTAL, CKMB, CKMBINDEX, TROPONINI in the last 168 hours. BNP: BNP (last 3 results) No results for input(s): BNP in the last 8760 hours.  ProBNP (last 3 results) No results for input(s): PROBNP in the last 8760 hours.  CBG: No results for input(s): GLUCAP in the last 168 hours.

## 2015-07-05 LAB — RESPIRATORY VIRUS PANEL
Adenovirus: NEGATIVE
INFLUENZA A: POSITIVE — AB
INFLUENZA B 1: NEGATIVE
Metapneumovirus: NEGATIVE
PARAINFLUENZA 2 A: NEGATIVE
Parainfluenza 1: NEGATIVE
Parainfluenza 3: NEGATIVE
RESPIRATORY SYNCYTIAL VIRUS B: NEGATIVE
RHINOVIRUS: NEGATIVE
Respiratory Syncytial Virus A: NEGATIVE

## 2015-07-05 LAB — LEGIONELLA PNEUMOPHILA TOTAL AB: Legionella Pneumo Total Ab: <0.91 OD ratio (ref 0.00–0.90)

## 2015-07-07 LAB — CULTURE, BLOOD (ROUTINE X 2)
CULTURE: NO GROWTH
Culture: NO GROWTH
Culture: NO GROWTH
Culture: NO GROWTH

## 2015-07-15 ENCOUNTER — Ambulatory Visit (INDEPENDENT_AMBULATORY_CARE_PROVIDER_SITE_OTHER): Payer: Medicare Other | Admitting: Psychiatry

## 2015-07-15 ENCOUNTER — Encounter (HOSPITAL_COMMUNITY): Payer: Self-pay | Admitting: Psychiatry

## 2015-07-15 ENCOUNTER — Other Ambulatory Visit: Payer: Self-pay | Admitting: Obstetrics and Gynecology

## 2015-07-15 VITALS — BP 124/72 | HR 108 | Ht 62.0 in | Wt 290.0 lb

## 2015-07-15 DIAGNOSIS — F410 Panic disorder [episodic paroxysmal anxiety] without agoraphobia: Secondary | ICD-10-CM

## 2015-07-15 DIAGNOSIS — F331 Major depressive disorder, recurrent, moderate: Secondary | ICD-10-CM

## 2015-07-15 DIAGNOSIS — R110 Nausea: Secondary | ICD-10-CM | POA: Diagnosis not present

## 2015-07-15 DIAGNOSIS — N920 Excessive and frequent menstruation with regular cycle: Secondary | ICD-10-CM

## 2015-07-15 MED ORDER — CLONAZEPAM 0.5 MG PO TABS: 0.5000 mg | ORAL_TABLET | Freq: Two times a day (BID) | ORAL | Status: DC | PRN

## 2015-07-15 MED ORDER — ARIPIPRAZOLE 2 MG PO TABS: 2.0000 mg | ORAL_TABLET | Freq: Every day | ORAL | Status: DC

## 2015-07-15 MED ORDER — FLUOXETINE HCL 20 MG PO CAPS: 20.0000 mg | ORAL_CAPSULE | Freq: Every day | ORAL | Status: DC

## 2015-07-15 NOTE — Progress Notes (Signed)
Patient ID: Cheyenne Arias, female   DOB: Aug 23, 1975, 40 y.o.   MRN: [id number]  [place] Follow up outpatient visit   Cheyenne Arias [id number] 40 y.o.  07/15/2015 10:23 AM  Chief Complaint:  Anxiety, depression. Follow up History of Present Illness:   Patient Presents for follow-up and medication management for major depressive disorder. Panic disorder. PTSD.  Patient is a single African-American female diagnosed with multiple medical conditions. She has 3 kids 2 of which are living with her 35 year old daughter and a 18-year-old son.   As per initial evaluation " Patient has been diagnosed with depression and says possible bipolar . Says that she is on Abilify 2 mg for night terrors and depression. She is also on Klonopin 0.5 mg up to 2 times a day.   Recent stress: admitted for pneumonia and influenza. Now recovering Mood disorder : gets frustrated at times. prozac helps Depression: on prozac. Takes care of her kids. Medical condition including fibromyalgia and spinal stenosis . Recent pneumonia Panic condition;vatiable related with stress. Takes klonopine twice a day. Anxiety gets worsened if she lowers the dose Night terrors: takes abilify it helps otherwise she screams and wakes up. It also helps mood symtpoms.. Medical complexity/Data: has OSA uses cpap. Which helps her sleep. Has fibromyalgia which complicates her depression. She continues with topamax and lyrica.  Severity of depression are scheduled and is 5 out of 10. 10 being very happy.   Aggravating factors: [name] Medical conditions. son in prison for armed robbery also include her medical conditions , fibromyalgia and her pain condition includes chronic back and neck pain. She also has a possibility of pituitary tumor that is being assessed  Modifying factors; some support and home health care  Prior Suicide Attempts: No  Medical History; Past Medical History  Diagnosis Date  . Allergy     seasonal  .  Anemia     goes to [facility], ferahem  . Asthma   . Spinal stenosis   . Peripheral neuropathy ([place])   . Migraines   . Hypertension   . OSA (obstructive sleep apnea)   . Depression   . Anxiety   . Obesity   . OSA on CPAP     Allergies: Allergies  Allergen Reactions  . Latex Itching, Swelling and Other (See Comments)    Reaction:  Blisters   . Morphine And Related Itching    Medications: Outpatient Encounter Prescriptions as of 07/15/2015  Medication Sig  . albuterol (PROVENTIL HFA;VENTOLIN HFA) 108 (90 BASE) MCG/ACT inhaler Inhale 2 puffs into the lungs every 6 (six) hours as needed for wheezing or shortness of breath.  [name] amLODipine (NORVASC) 10 MG tablet Take 10 mg by mouth daily.  [name] amoxicillin-clavulanate (AUGMENTIN) 125-31.25 MG/5ML suspension Take 10 mLs (250 mg total) by mouth 2 (two) times daily.  . ARIPiprazole (ABILIFY) 2 MG tablet Take 1 tablet (2 mg total) by mouth daily.  . budesonide (PULMICORT) 0.5 MG/2ML nebulizer solution Take 2 mLs (0.5 mg total) by nebulization 2 (two) times daily.  . clonazePAM (KLONOPIN) 0.5 MG tablet Take 1 tablet (0.5 mg total) by mouth 2 (two) times daily as needed for anxiety.  . cyclobenzaprine (FLEXERIL) 10 MG tablet Take 10 mg by mouth 3 (three) times daily as needed for muscle spasms.   . ferumoxytol (FERAHEME) 510 MG/17ML SOLN injection Inject 510 mg into the vein every 3 (three) months.  [name] FLUoxetine (PROZAC) 20 MG capsule Take 1 capsule (20 mg total) by mouth daily.  [name]  fluticasone (FLONASE) 50 MCG/ACT nasal spray Place 2 sprays into both nostrils 2 (two) times daily.  . furosemide (LASIX) 80 MG tablet Take 80 mg by mouth 2 (two) times daily.  [name] ibuprofen (ADVIL,MOTRIN) 800 MG tablet Take 800 mg by mouth 2 (two) times daily.  [name] ipratropium-albuterol (DUONEB) 0.5-2.5 (3) MG/3ML SOLN Take 3 mLs by nebulization every 4 (four) hours as needed (for wheezing/shortness of breath).  . lactulose (CHRONULAC) 10 GM/15ML solution Take 10 g by mouth 3  (three) times daily as needed for mild constipation.  [name] loratadine (CLARITIN) 10 MG tablet Take 1 tablet (10 mg total) by mouth at bedtime.  . medroxyPROGESTERone (PROVERA) 10 MG tablet Take 10 mg by mouth every 6 (six) hours as needed. Menstrual bleeding  . metoprolol tartrate (LOPRESSOR) 25 MG tablet Take 25 mg by mouth every morning.   . montelukast (SINGULAIR) 10 MG tablet Take 1 tablet (10 mg total) by mouth at bedtime.  . Multiple Vitamins-Minerals (MULTIVITAMIN WITH MINERALS) tablet Take 1 tablet by mouth daily.  . ondansetron (ZOFRAN) 8 MG tablet Take 1 tablet (8 mg total) by mouth every 8 (eight) hours as needed for nausea or vomiting.  [name] oseltamivir (TAMIFLU) 75 MG capsule Take 1 capsule (75 mg total) by mouth 2 (two) times daily.  [name] oxyCODONE (ROXICODONE) 15 MG immediate release tablet Take 15 mg by mouth every 6 (six) hours.  . pantoprazole (PROTONIX) 40 MG tablet TAKE 1 TABLET (40 MG TOTAL) BY MOUTH DAILY.  [name] potassium chloride SA (K-DUR,KLOR-CON) 20 MEQ tablet Take 20 mEq by mouth 2 (two) times daily.  . predniSONE (DELTASONE) 50 MG tablet Take 1 tablet (50 mg total) by mouth daily with breakfast.  . pregabalin (LYRICA) 75 MG capsule Take 225 mg by mouth 2 (two) times daily.   . rizatriptan (MAXALT) 10 MG tablet Take 10 mg by mouth as needed for migraine. May repeat in 2 hours if needed  . topiramate (TOPAMAX) 100 MG tablet Take 100 mg by mouth 2 (two) times daily.  . [DISCONTINUED] ARIPiprazole (ABILIFY) 2 MG tablet Take 1 tablet (2 mg total) by mouth daily.  . [DISCONTINUED] clonazePAM (KLONOPIN) 0.5 MG tablet Take 1 tablet (0.5 mg total) by mouth 2 (two) times daily as needed for anxiety.  . [DISCONTINUED] FLUoxetine (PROZAC) 20 MG capsule Take 1 capsule (20 mg total) by mouth daily.   No facility-administered encounter medications on file as of 07/15/2015.     Family History; Family History  Problem Relation Age of Onset  . Sickle cell trait Maternal Aunt   . Sickle cell  anemia Other   . Schizophrenia Son   . Depression Daughter        Labs:  Recent Results (from the past 2160 hour(s))  CBC & Diff and Retic     Status: Abnormal   Collection Time: 04/20/15  2:40 PM  Result Value Ref Range   WBC 12.0 (H) 3.9 - 10.3 10e3/uL   NEUT# 8.9 (H) 1.5 - 6.5 10e3/uL   HGB 10.9 (L) 11.6 - 15.9 g/dL   HCT 34.5 (L) 34.8 - 46.6 %   Platelets 359 145 - 400 10e3/uL   MCV 78.6 (L) 79.5 - 101.0 fL   MCH 24.8 (L) 25.1 - 34.0 pg   MCHC 31.6 31.5 - 36.0 g/dL   RBC 4.39 3.70 - 5.45 10e6/uL   RDW 15.8 (H) 11.2 - 14.5 %   lymph# 2.3 0.9 - 3.3 10e3/uL   MONO# 0.5 0.1 - 0.9 10e3/uL  Eosinophils Absolute 0.3 0.0 - 0.5 10e3/uL   Basophils Absolute 0.0 0.0 - 0.1 10e3/uL   NEUT% 73.9 38.4 - 76.8 %   LYMPH% 19.2 14.0 - 49.7 %   MONO% 4.2 0.0 - 14.0 %   EOS% 2.5 0.0 - 7.0 %   BASO% 0.2 0.0 - 2.0 %   Retic % 1.32 0.70 - 2.10 %   Retic Ct Abs 57.95 33.70 - 90.70 10e3/uL   Immature Retic Fract 10.20 (H) 1.60 - 10.00 %  Ferritin     Status: Abnormal   Collection Time: 04/20/15  2:40 PM  Result Value Ref Range   Ferritin 380 (H) 9 - 269 ng/ml  Comprehensive metabolic panel     Status: Abnormal   Collection Time: 07/02/15  3:10 AM  Result Value Ref Range   Sodium 136 135 - 145 mmol/L   Potassium 3.1 (L) 3.5 - 5.1 mmol/L   Chloride 106 101 - 111 mmol/L   CO2 19 (L) 22 - 32 mmol/L   Glucose, Bld 111 (H) 65 - 99 mg/dL   BUN 11 6 - 20 mg/dL   Creatinine, Ser 0.94 0.44 - 1.00 mg/dL   Calcium 8.6 (L) 8.9 - 10.3 mg/dL   Total Protein 7.9 6.5 - 8.1 g/dL   Albumin 3.5 3.5 - 5.0 g/dL   AST 56 (H) 15 - 41 U/L   ALT 68 (H) 14 - 54 U/L   Alkaline Phosphatase 69 38 - 126 U/L   Total Bilirubin 0.4 0.3 - 1.2 mg/dL   GFR calc non Af Amer >60 >60 mL/min   GFR calc Af Amer >60 >60 mL/min    Comment: (NOTE) The eGFR has been calculated using the CKD EPI equation. This calculation has not been validated in all clinical situations. eGFR's persistently <60 mL/min signify possible  Chronic Kidney Disease.    Anion gap 11 5 - 15  CBC WITH DIFFERENTIAL     Status: Abnormal   Collection Time: 07/02/15  3:10 AM  Result Value Ref Range   WBC 15.3 (H) 4.0 - 10.5 K/uL   RBC 4.03 3.87 - 5.11 MIL/uL   Hemoglobin 9.2 (L) 12.0 - 15.0 g/dL   HCT 30.0 (L) 36.0 - 46.0 %   MCV 74.4 (L) 78.0 - 100.0 fL   MCH 22.8 (L) 26.0 - 34.0 pg   MCHC 30.7 30.0 - 36.0 g/dL   RDW 16.7 (H) 11.5 - 15.5 %   Platelets 351 150 - 400 K/uL   Neutrophils Relative % 91 %   Lymphocytes Relative 6 %   Monocytes Relative 3 %   Eosinophils Relative 0 %   Basophils Relative 0 %   Neutro Abs 13.9 (H) 1.7 - 7.7 K/uL   Lymphs Abs 0.9 0.7 - 4.0 K/uL   Monocytes Absolute 0.5 0.1 - 1.0 K/uL   Eosinophils Absolute 0.0 0.0 - 0.7 K/uL   Basophils Absolute 0.0 0.0 - 0.1 K/uL   RBC Morphology POLYCHROMASIA PRESENT    WBC Morphology TOXIC GRANULATION   Blood Culture (routine x 2)     Status: None   Collection Time: 07/02/15  3:10 AM  Result Value Ref Range   Specimen Description BLOOD ARM RIGHT    Special Requests      BOTTLES DRAWN AEROBIC AND ANAEROBIC AER 5CC ANA 5CC   Culture      NO GROWTH 5 DAYS Performed at [facility]    Report Status 07/07/2015 FINAL   Blood Culture (routine x 2)  Status: None   Collection Time: 07/02/15  3:15 AM  Result Value Ref Range   Specimen Description BLOOD LEFT ANTECUBITAL    Special Requests      BOTTLES DRAWN AEROBIC AND ANAEROBIC AER 5CC ANA 5CC   Culture      NO GROWTH 5 DAYS Performed at [facility]    Report Status 07/07/2015 FINAL   I-Stat CG4 Lactic Acid, ED  (not at  [facility])     Status: None   Collection Time: 07/02/15  3:23 AM  Result Value Ref Range   Lactic Acid, Venous 1.55 0.5 - 2.0 mmol/L  Urinalysis, Routine w reflex microscopic (not at [facility])     Status: Abnormal   Collection Time: 07/02/15  8:00 AM  Result Value Ref Range   Color, Urine YELLOW YELLOW   APPearance CLOUDY (A) CLEAR   Specific Gravity, Urine 1.012 1.005 -  1.030   pH 6.5 5.0 - 8.0   Glucose, UA NEGATIVE NEGATIVE mg/dL   Hgb urine dipstick LARGE (A) NEGATIVE   Bilirubin Urine NEGATIVE NEGATIVE   Ketones, ur NEGATIVE NEGATIVE mg/dL   Protein, ur NEGATIVE NEGATIVE mg/dL   Nitrite NEGATIVE NEGATIVE   Leukocytes, UA NEGATIVE NEGATIVE  Urine culture     Status: None   Collection Time: 07/02/15  8:00 AM  Result Value Ref Range   Specimen Description URINE, CLEAN CATCH    Special Requests NONE    Culture      3,000 COLONIES/mL INSIGNIFICANT GROWTH Performed at [facility]    Report Status 07/04/2015 FINAL   Urine microscopic-add on     Status: Abnormal   Collection Time: 07/02/15  8:00 AM  Result Value Ref Range   Squamous Epithelial / LPF 0-5 (A) NONE SEEN   WBC, UA NONE SEEN 0 - 5 WBC/hpf   RBC / HPF 6-30 0 - 5 RBC/hpf   Bacteria, UA FEW (A) NONE SEEN   Urine-Other MUCOUS PRESENT   HIV antibody     Status: None   Collection Time: 07/02/15 10:15 AM  Result Value Ref Range   HIV Screen 4th Generation wRfx Non Reactive Non Reactive    Comment: (NOTE) Performed At: [facility] [place], [place] [id number] Lindon Romp MD QQ:[id number]   Legionella pneumophila Total Ab  (not at [facility])     Status: None   Collection Time: 07/02/15 10:15 AM  Result Value Ref Range   Legionella Pneumo Total Ab <0.91 0.00 - 0.90 OD ratio    Comment: (NOTE)                               Negative          <0.91                               Equivocal   0.91 - 1.09                               Positive          >1.09          This assay detects IgG/IgM/IgA antibodies to          L. pneumophila Groups 1-6 by the EIA method. Performed At: [facility] [place], [place] [id number] Lindon Romp MD  RD:[id number]   Culture, blood (x 2)     Status: None   Collection Time: 07/02/15 10:15 AM  Result Value Ref Range   Specimen Description BLOOD RIGHT ANTECUBITAL    Special Requests BOTTLES DRAWN  AEROBIC AND ANAEROBIC 5 CC EACH    Culture      NO GROWTH 5 DAYS Performed at [facility]    Report Status 07/07/2015 FINAL   Procalcitonin     Status: None   Collection Time: 07/02/15 10:15 AM  Result Value Ref Range   Procalcitonin 2.25 ng/mL    Comment:        Interpretation: PCT > 2 ng/mL: Systemic infection (sepsis) is likely, unless other causes are known. (NOTE)         ICU PCT Algorithm               Non ICU PCT Algorithm    ----------------------------     ------------------------------         PCT < 0.25 ng/mL                 PCT < 0.1 ng/mL     Stopping of antibiotics            Stopping of antibiotics       strongly encouraged.               strongly encouraged.    ----------------------------     ------------------------------       PCT level decrease by               PCT < 0.25 ng/mL       >= 80% from peak PCT       OR PCT 0.25 - 0.5 ng/mL          Stopping of antibiotics                                             encouraged.     Stopping of antibiotics           encouraged.    ----------------------------     ------------------------------       PCT level decrease by              PCT >= 0.25 ng/mL       < 80% from peak PCT        AND PCT >= 0.5 ng/mL            Continuing antibiotics                                               encouraged.       Continuing antibiotics            encouraged.    ----------------------------     ------------------------------     PCT level increase compared          PCT > 0.5 ng/mL         with peak PCT AND          PCT >= 0.5 ng/mL             Escalation of antibiotics  strongly encouraged.      Escalation of antibiotics        strongly encouraged.   Culture, blood (routine x 2) Call MD if unable to obtain prior to antibiotics being given     Status: None   Collection Time: 07/02/15 10:28 AM  Result Value Ref Range   Specimen Description BLOOD BLOOD LEFT FOREARM    Special  Requests BOTTLES DRAWN AEROBIC AND ANAEROBIC 7 CC EACH    Culture      NO GROWTH 5 DAYS Performed at [facility]    Report Status 07/07/2015 FINAL   Lactic acid, plasma     Status: None   Collection Time: 07/02/15 10:34 AM  Result Value Ref Range   Lactic Acid, Venous 1.3 0.5 - 2.0 mmol/L  Lactic acid, plasma     Status: Abnormal   Collection Time: 07/02/15  2:06 PM  Result Value Ref Range   Lactic Acid, Venous 2.1 (HH) 0.5 - 2.0 mmol/L    Comment: CRITICAL RESULT CALLED TO, READ BACK BY AND VERIFIED WITH: A.LARSON AT 1444 ON 07/02/15 BY S.VANHOORNE   Respiratory virus antigens panel     Status: Abnormal   Collection Time: 07/02/15  4:46 PM  Result Value Ref Range   Source - RVPAN NOSE    Respiratory Syncytial Virus A Negative Negative   Respiratory Syncytial Virus B Negative Negative   Influenza A Positive (A) Negative    Comment: Subtype: H3   Influenza B Negative Negative   Parainfluenza 1 Negative Negative   Parainfluenza 2 Negative Negative   Parainfluenza 3 Negative Negative   Metapneumovirus Negative Negative   Rhinovirus Negative Negative   Adenovirus Negative Negative    Comment: (NOTE) Performed At: [facility] [address] [place], [place] [id number] Lindon Romp MD [id number]   Influenza panel by PCR (type A & B, H1N1)     Status: Abnormal   Collection Time: 07/02/15  4:46 PM  Result Value Ref Range   Influenza A By PCR POSITIVE (A) NEGATIVE   Influenza B By PCR NEGATIVE NEGATIVE   H1N1 flu by pcr NOT DETECTED NOT DETECTED    Comment:        The Xpert Flu assay (FDA approved for nasal aspirates or washes and nasopharyngeal swab specimens), is intended as an aid in the diagnosis of influenza and should not be used as a sole basis for treatment. Performed at [facility]   Strep pneumoniae urinary antigen     Status: None   Collection Time: 07/02/15 10:14 PM  Result Value Ref Range   Strep Pneumo Urinary Antigen NEGATIVE  NEGATIVE    Comment:        Infection due to S. pneumoniae cannot be absolutely ruled out since the antigen present may be below the detection limit of the test. Performed at [place] metabolic panel     Status: Abnormal   Collection Time: 07/03/15  6:41 AM  Result Value Ref Range   Sodium 139 135 - 145 mmol/L   Potassium 3.6 3.5 - 5.1 mmol/L   Chloride 111 101 - 111 mmol/L   CO2 22 22 - 32 mmol/L   Glucose, Bld 115 (H) 65 - 99 mg/dL   BUN 10 6 - 20 mg/dL   Creatinine, Ser 0.69 0.44 - 1.00 mg/dL   Calcium 8.2 (L) 8.9 - 10.3 mg/dL   GFR calc non Af Amer >60 >60 mL/min   GFR calc Af Amer >  60 >60 mL/min    Comment: (NOTE) The eGFR has been calculated using the CKD EPI equation. This calculation has not been validated in all clinical situations. eGFR's persistently <60 mL/min signify possible Chronic Kidney Disease.    Anion gap 6 5 - 15  CBC     Status: Abnormal   Collection Time: 07/03/15  6:41 AM  Result Value Ref Range   WBC 20.1 (H) 4.0 - 10.5 K/uL   RBC 3.93 3.87 - 5.11 MIL/uL   Hemoglobin 8.9 (L) 12.0 - 15.0 g/dL   HCT 30.0 (L) 36.0 - 46.0 %   MCV 76.3 (L) 78.0 - 100.0 fL   MCH 22.6 (L) 26.0 - 34.0 pg   MCHC 29.7 (L) 30.0 - 36.0 g/dL   RDW 16.9 (H) 11.5 - 15.5 %   Platelets 342 150 - 400 K/uL       Musculoskeletal: Strength & Muscle Tone: within normal limits Gait & Station: normal Patient leans: N/A  Mental Status Examination;   Psychiatric Specialty Exam: Physical Exam  Constitutional: She appears well-nourished.  Skin: She is not diaphoretic.    Review of Systems  Constitutional: Positive for malaise/fatigue. Negative for fever.  Cardiovascular: Negative for chest pain.  Gastrointestinal: Negative for nausea.  Skin: Negative for rash.  Psychiatric/Behavioral: Negative for suicidal ideas and substance abuse. The patient is nervous/anxious.     Blood pressure 124/72, pulse 108, height _0  (1.575 m), weight 290 lb (131.543 kg),  last menstrual period 06/18/2015, SpO2 99 %.Body mass index is 53.03 kg/(m^2).  General Appearance: Casual  Eye Contact::  Fair  Speech:  Slow  Volume:  Normal  Mood:  Somewhat dysphoric varies with stess  Affect:  Congruent  Thought Process:  Coherent  Orientation:  Full (Time, Place, and Person)  Thought Content:  Rumination  Suicidal Thoughts:  No  Homicidal Thoughts:  No  Memory:  Immediate;   Fair Recent;   Fair  Judgement:  Fair  Insight:  Fair  Psychomotor Activity:  Normal  Concentration:  Fair  Recall:  Fair  Akathisia:  Negative  Handed:  Right  AIMS (if indicated):     Assets:  Communication Skills Desire for Improvement Financial Resources/Insurance Housing  Sleep:        Assessment: Axis I: Major depressive disorder recurrent moderate. Panic disorder. Mood disorder otherwise nonspecified. Possible related  secondary to general medical condition including chronic iback pain  Axis II: Deferred   Axis III:  Past Medical History  Diagnosis Date  . Allergy     seasonal  . Anemia     goes to [facility], ferahem  . Asthma   . Spinal stenosis   . Peripheral neuropathy ([place])   . Migraines   . Hypertension   . OSA (obstructive sleep apnea)   . Depression   . Anxiety   . Obesity   . OSA on CPAP     Axis IV: Psychosocial, single mom, multiple medical conditions.   Treatment Plan and Summary:  Depression: continue prozac and also for anxiety and possible PTSD. Refills sent. Mood disorder; related to fibromyalgia, back condition and others. Continue follow up and compliance Panic disorder; klonopine continue but avoid increaing dose  Abilify 2 mg for mood and night terrors and mood symptoms. . Refills written. She is also on Topamax and Lyrica which may also be helping the mood symptoms. No involuntary movements.  Does not want to change or increase doses.   Pertinent Labs and Relevant Prior Notes reviewed. Medication  Side effects, benefits and risks  reviewed/discussed with Patient. Time given for patient to respond and asks questions regarding the Diagnosis and Medications. Safety concerns and to report to ER if suicidal or call 911.  Discussed weight maintenance and Sleep Hygiene. Patient on cpap.  Discussed to avail opportunity to consider or/and continue Individual therapy with Counselor. Greater than 50% of time was spend in counseling and coordination of care with the patient.  Schedule for Follow up visit in 2 to 3  month or call in earlier as necessary. Time spent: 25 minutes  Merian Capron, MD 07/15/2015

## 2015-07-20 ENCOUNTER — Telehealth: Payer: Self-pay | Admitting: Pulmonary Disease

## 2015-07-20 NOTE — Telephone Encounter (Signed)
Called and spoke with pt. She c/o prod cough with clear mucus, chest congestion, nausea at times, SOB and wheezing with exertion. States she was admitted on 3/18-3/20/17 for pneumonia. I scheduled her for an ov with SG on 07/22/15 at 4:30pm. She voiced understanding and had no further questions.

## 2015-07-21 ENCOUNTER — Other Ambulatory Visit (HOSPITAL_BASED_OUTPATIENT_CLINIC_OR_DEPARTMENT_OTHER): Payer: Medicare Other

## 2015-07-21 ENCOUNTER — Telehealth: Payer: Self-pay | Admitting: *Deleted

## 2015-07-21 ENCOUNTER — Other Ambulatory Visit: Payer: Self-pay | Admitting: Hematology and Oncology

## 2015-07-21 DIAGNOSIS — D509 Iron deficiency anemia, unspecified: Secondary | ICD-10-CM

## 2015-07-21 LAB — CBC & DIFF AND RETIC
BASO%: 0.4 % (ref 0.0–2.0)
BASOS ABS: 0 10*3/uL (ref 0.0–0.1)
EOS%: 1.8 % (ref 0.0–7.0)
Eosinophils Absolute: 0.1 10*3/uL (ref 0.0–0.5)
HEMATOCRIT: 33.7 % — AB (ref 34.8–46.6)
HGB: 10.2 g/dL — ABNORMAL LOW (ref 11.6–15.9)
IMMATURE RETIC FRACT: 11.9 % — AB (ref 1.60–10.00)
LYMPH#: 1.7 10*3/uL (ref 0.9–3.3)
LYMPH%: 21.4 % (ref 14.0–49.7)
MCH: 22.4 pg — ABNORMAL LOW (ref 25.1–34.0)
MCHC: 30.3 g/dL — AB (ref 31.5–36.0)
MCV: 74.1 fL — ABNORMAL LOW (ref 79.5–101.0)
MONO#: 0.3 10*3/uL (ref 0.1–0.9)
MONO%: 4.4 % (ref 0.0–14.0)
NEUT#: 5.6 10*3/uL (ref 1.5–6.5)
NEUT%: 72 % (ref 38.4–76.8)
PLATELETS: 450 10*3/uL — AB (ref 145–400)
RBC: 4.55 10*6/uL (ref 3.70–5.45)
RDW: 17.2 % — AB (ref 11.2–14.5)
RETIC %: 1.34 % (ref 0.70–2.10)
RETIC CT ABS: 60.97 10*3/uL (ref 33.70–90.70)
WBC: 7.7 10*3/uL (ref 3.9–10.3)

## 2015-07-21 LAB — FERRITIN: Ferritin: 148 ng/ml (ref 9–269)

## 2015-07-21 NOTE — Telephone Encounter (Signed)
-----   Message from Heath Lark, MD sent at 07/21/2015 12:44 PM EDT ----- Regarding: high ferritin Pls let her know anemia is better Her ferritin is actually high, so no need to come back for Rx next week I will reschedule another blood draw in 2 months ----- Message -----    From: [facility] Interface    Sent: 07/21/2015  11:29 AM      To: Heath Lark, MD

## 2015-07-21 NOTE — Telephone Encounter (Signed)
LVM for pt informing her of Ferritin and Hgb results good along w/ Dr. Calton Dach message below.  Informed her appts canceled next week and Scheduler will call w/ new appts in 2 months.  Please call nurse back if any questions or concerns.

## 2015-07-21 NOTE — Telephone Encounter (Signed)
Pt states she is surprised her Hgb is good considering it was 8.9 a few weeks ago and she has been having very heavy menstrual bleeding and "losing blood" for the past month.  Pt states she is having CT scan tomorrow and then is supposed to be scheduled for a D & C soon.   She asks if the D & C will cause her to "lose more blood?"   Informed pt that D & C should help stop blood loss.   Pt wants Dr. Alvy Bimler to be aware of her condition and just wants to make sure she doesn't need to be checked again sooner than 2 months?   Per Dr. Alvy Bimler we can check lab in one month instead of two months.  I called pt back and left her a VM informing her of lab to be scheduled in one month,  Expect call from Carlyss and also call back if any further questions.

## 2015-07-22 ENCOUNTER — Inpatient Hospital Stay: Payer: Self-pay | Admitting: Acute Care

## 2015-07-22 ENCOUNTER — Telehealth: Payer: Self-pay | Admitting: Hematology and Oncology

## 2015-07-22 ENCOUNTER — Ambulatory Visit
Admission: RE | Admit: 2015-07-22 | Discharge: 2015-07-22 | Disposition: A | Discharge status: Home / Self Care | Payer: Medicare Other | Source: Ambulatory Visit | Attending: Obstetrics and Gynecology | Admitting: Obstetrics and Gynecology

## 2015-07-22 DIAGNOSIS — N920 Excessive and frequent menstruation with regular cycle: Secondary | ICD-10-CM

## 2015-07-22 MED ORDER — IOPAMIDOL (ISOVUE-300) INJECTION 61%
125.0000 mL | Freq: Once | INTRAVENOUS | Status: AC | PRN
Start: 2015-07-22 — End: 2015-07-22
  Administered 2015-07-22: 125 mL via INTRAVENOUS

## 2015-07-22 NOTE — Telephone Encounter (Signed)
lvm for pt regarding to May and JUNE appt

## 2015-07-25 ENCOUNTER — Ambulatory Visit (INDEPENDENT_AMBULATORY_CARE_PROVIDER_SITE_OTHER)
Admission: RE | Admit: 2015-07-25 | Discharge: 2015-07-25 | Disposition: A | Discharge status: Home / Self Care | Payer: Medicare Other | Source: Ambulatory Visit | Attending: Acute Care | Admitting: Acute Care

## 2015-07-25 ENCOUNTER — Ambulatory Visit (INDEPENDENT_AMBULATORY_CARE_PROVIDER_SITE_OTHER): Payer: Medicare Other | Admitting: Acute Care

## 2015-07-25 ENCOUNTER — Encounter: Payer: Self-pay | Admitting: Acute Care

## 2015-07-25 ENCOUNTER — Ambulatory Visit: Admit: 2015-07-25 | Payer: Self-pay | Admitting: Obstetrics and Gynecology

## 2015-07-25 VITALS — BP 128/74 | HR 108 | Ht 62.0 in | Wt 294.0 lb

## 2015-07-25 DIAGNOSIS — J1100 Influenza due to unidentified influenza virus with unspecified type of pneumonia: Secondary | ICD-10-CM

## 2015-07-25 DIAGNOSIS — J189 Pneumonia, unspecified organism: Secondary | ICD-10-CM

## 2015-07-25 DIAGNOSIS — G4733 Obstructive sleep apnea (adult) (pediatric): Secondary | ICD-10-CM | POA: Diagnosis not present

## 2015-07-25 SURGERY — DILATATION & CURETTAGE/HYSTEROSCOPY WITH RESECTOCOPE
Anesthesia: Choice

## 2015-07-25 NOTE — Patient Instructions (Addendum)
It is nice to meet you today. I am glad you are feeling better. The CXR we did today shows that your pneumonia has cleared. Mucinex 2 tablets twice daily to help you cough up the chest congestion. Continue your Pulmicort nebs. Continue your Duonebs as needed for wheezing and shortness of breath. Continue using your CPAP at night with oxygen. Wear it at least 4-6 hours each night for maximal benefit. While seasonal allergies are bad shower before bed and after outside exposure. Please check with your insurance to see if there is a less expensive neb treatment they will cover better. Follow up with Dr. Halford Chessman as scheduled for 08/02/2015.

## 2015-07-25 NOTE — Progress Notes (Signed)
Subjective:    Patient ID: Leeanne Rio, female    DOB: [birth year], 40 y.o.   MRN: [id number]  HPI  40 year old female with past medical history of fibromyalgia, OSA on CPAP with oxygen, asthma, hypertension seen by Dr. Halford Chessman.   Tests: PSG 03/27/11 >> AHI 10.9 Echo 09/20/11 > >mod LVH, EF 65 to 70%, small/mod pericardial effusion  Doppler legs 09/21/11 >> no DVT CT chest 09/22/11 >> no PE, Lt base ATX/ASD, borderline cardiomegaly Spirometry 01/10/12 >> FEV1 1.72 (71%), FEV1% 77 CT sinus 02/28/12 >> clear paranasal sinuses RAST 02/28/12 >> IgE 15.6, moderate reaction to [facility] CT chest 04/03/12 >> ASD superior segment LLL, RLL ATX PFT 12/17/12 >> FEV1 2.14 (95%), FEV1% 88, TLC 2.62 (58%), DLCO 96%, no BD CPAP 03/01/13 to 03/30/13 >> Used on 18 of 30 nights with average 5 hrs 58 minutes. Average AHI 0.7 with CPAP 10 cm H2O.  Hospital Admission  Admit date: 07/02/2015 Discharge date: 07/04/2015 Principal Problem: Sepsis secondary to Influenza A pneumonia ([place]) / leukocytosis Treated with Augmentin/ Tamiflu.  07/25/2015: Chest x-ray Cardiomediastinal silhouette is unremarkable. No acute infiltrate or pleural effusion. No pulmonary edema. Bony thorax is unremarkable.  07/25/2015: Patient presents to the office today for hospital follow-up. She was admitted to the hospital March 18 with sepsis due to influenza a pneumonia. She was treated with Tamiflu and Augmentin. She states she still has some shortness of breath with exertio , and some chest congestion. She denies fever, purulent secretions, chest pain, orthopnea, or hemoptysis. She continues to use her CPAP at night with oxygen. She uses her Pulmicort nebs 2 times daily for maintenance, and DuoNeb nebulizer treatments as needed for shortness of breath. She states she is down to using these only about 2 times daily. Chest x-ray today in the office indicates resolution of pneumonia.   Current outpatient prescriptions:  .  albuterol  (PROVENTIL HFA;VENTOLIN HFA) 108 (90 BASE) MCG/ACT inhaler, Inhale 2 puffs into the lungs every 6 (six) hours as needed for wheezing or shortness of breath., Disp: , Rfl:  .  amLODipine (NORVASC) 10 MG tablet, Take 10 mg by mouth daily., Disp: , Rfl:  .  ARIPiprazole (ABILIFY) 2 MG tablet, Take 1 tablet (2 mg total) by mouth daily., Disp: 30 tablet, Rfl: 1 .  budesonide (PULMICORT) 0.5 MG/2ML nebulizer solution, Take 2 mLs (0.5 mg total) by nebulization 2 (two) times daily., Disp: 120 mL, Rfl: 5 .  clonazePAM (KLONOPIN) 0.5 MG tablet, Take 1 tablet (0.5 mg total) by mouth 2 (two) times daily as needed for anxiety., Disp: 60 tablet, Rfl: 2 .  cyclobenzaprine (FLEXERIL) 10 MG tablet, Take 10 mg by mouth 3 (three) times daily as needed for muscle spasms. , Disp: , Rfl:  .  ferumoxytol (FERAHEME) 510 MG/17ML SOLN injection, Inject 510 mg into the vein every 3 (three) months., Disp: , Rfl:  .  FLUoxetine (PROZAC) 20 MG capsule, Take 1 capsule (20 mg total) by mouth daily., Disp: 30 capsule, Rfl: 2 .  fluticasone (FLONASE) 50 MCG/ACT nasal spray, Place 2 sprays into both nostrils 2 (two) times daily., Disp: , Rfl:  .  furosemide (LASIX) 80 MG tablet, Take 80 mg by mouth 2 (two) times daily., Disp: , Rfl:  .  ibuprofen (ADVIL,MOTRIN) 800 MG tablet, Take 800 mg by mouth 2 (two) times daily., Disp: , Rfl:  .  ipratropium-albuterol (DUONEB) 0.5-2.5 (3) MG/3ML SOLN, Take 3 mLs by nebulization every 4 (four) hours as needed (for wheezing/shortness of  breath)., Disp: 360 mL, Rfl: 5 .  lactulose (CHRONULAC) 10 GM/15ML solution, Take 10 g by mouth 3 (three) times daily as needed for mild constipation., Disp: , Rfl:  .  loratadine (CLARITIN) 10 MG tablet, Take 1 tablet (10 mg total) by mouth at bedtime., Disp: 30 tablet, Rfl: 3 .  medroxyPROGESTERone (PROVERA) 10 MG tablet, Take 10 mg by mouth every 6 (six) hours as needed. Menstrual bleeding, Disp: , Rfl: 1 .  metoprolol tartrate (LOPRESSOR) 25 MG tablet, Take 25 mg  by mouth every morning. , Disp: , Rfl:  .  montelukast (SINGULAIR) 10 MG tablet, Take 1 tablet (10 mg total) by mouth at bedtime., Disp: 30 tablet, Rfl: 3 .  Multiple Vitamins-Minerals (MULTIVITAMIN WITH MINERALS) tablet, Take 1 tablet by mouth daily., Disp: , Rfl:  .  ondansetron (ZOFRAN) 8 MG tablet, Take 1 tablet (8 mg total) by mouth every 8 (eight) hours as needed for nausea or vomiting., Disp: 30 tablet, Rfl: 0 .  oxyCODONE (ROXICODONE) 15 MG immediate release tablet, Take 15 mg by mouth every 6 (six) hours., Disp: , Rfl: 0 .  pantoprazole (PROTONIX) 40 MG tablet, TAKE 1 TABLET (40 MG TOTAL) BY MOUTH DAILY., Disp: 30 tablet, Rfl: 3 .  potassium chloride SA (K-DUR,KLOR-CON) 20 MEQ tablet, Take 20 mEq by mouth 2 (two) times daily., Disp: , Rfl:  .  pregabalin (LYRICA) 75 MG capsule, Take 225 mg by mouth 2 (two) times daily. , Disp: , Rfl:  .  rizatriptan (MAXALT) 10 MG tablet, Take 10 mg by mouth as needed for migraine. May repeat in 2 hours if needed, Disp: , Rfl:  .  topiramate (TOPAMAX) 100 MG tablet, Take 100 mg by mouth 2 (two) times daily., Disp: , Rfl:  .  [DISCONTINUED] zolpidem (AMBIEN) 5 MG tablet, Take 5 mg by mouth at bedtime as needed for sleep. , Disp: , Rfl:    Past Medical History  Diagnosis Date  . Allergy     seasonal  . Anemia     goes to [facility], ferahem  . Asthma   . Spinal stenosis   . Peripheral neuropathy ([place])   . Migraines   . Hypertension   . OSA (obstructive sleep apnea)   . Depression   . Anxiety   . Obesity   . OSA on CPAP     Allergies  Allergen Reactions  . Latex Itching, Swelling and Other (See Comments)    Reaction:  Blisters   . Morphine And Related Itching   Review of Systems Constitutional:   No  weight loss, night sweats,  Fevers, chills, fatigue, or  lassitude.  HEENT:   No headaches,  Difficulty swallowing,  Tooth/dental problems, or  Sore throat,                No sneezing, itching, ear ache, nasal congestion, post nasal drip,    CV:  No chest pain,  Orthopnea, PND, swelling in lower extremities, anasarca, dizziness, palpitations, syncope.   GI  No heartburn, indigestion, abdominal pain, nausea, vomiting, diarrhea, change in bowel habits, loss of appetite, bloody stools.   Resp: + shortness of breath with exertion not at rest.  No excess mucus, no productive cough,  No non-productive cough,  No coughing up of blood.  No change in color of mucus.  No wheezing.  No chest wall deformity,+ chest congestion  Skin: no rash or lesions.  GU: no dysuria, change in color of urine, no urgency or frequency.  No flank pain,  no hematuria   MS:  No joint pain or swelling.  No decreased range of motion.  No back pain.  Psych:  No change in mood or affect. No depression or anxiety.  No memory loss.        Objective:   Physical Exam  BP 128/74 mmHg  Pulse 108  Ht 5\' 2"  (1.575 m)  Wt 294 lb (133.358 kg)  BMI 53.76 kg/m2  SpO2 98%  LMP 07/14/2015  Physical Exam:  General- No distress,  A&Ox3, obese ENT: No sinus tenderness, TM clear, pale nasal mucosa, no oral exudate,no post nasal drip, no LAN Cardiac: S1, S2, regular rate and rhythm, no murmur Chest: No wheeze/ rales/ dullness; no accessory muscle use, no nasal flaring, no sternal retractions Abd.: Soft Non-tender Ext: No clubbing cyanosis, edema Neuro:  normal strength Skin: No rashes, warm and dry Psych: normal mood and behavior    Magdalen Spatz, AGACNP-BC [place] Pager # [phone] 07/25/2015 Assessment & Plan:

## 2015-07-25 NOTE — Assessment & Plan Note (Signed)
OSA: Plan Continue on CPAP at bedtime. You appear to be benefiting from the treatment Goal is to wear for at least 4-6 hours each night for maximal clinical benefit. Continue to work on weight loss, as the link between excess weight  and sleep apnea is well established.  Do not drive if sleepy.

## 2015-07-25 NOTE — Assessment & Plan Note (Signed)
Post Flu A Pneumonia  The CXR we did today shows that your pneumonia has cleared. Mucinex 2 tablets twice daily to help you cough up the chest congestion. Continue your Pulmicort nebs. Continue your Duonebs as needed for wheezing and shortness of breath. Please contact office for sooner follow up if symptoms do not improve or worsen or seek emergency care

## 2015-07-25 NOTE — Progress Notes (Signed)
Reviewed and agree with assessment/plan.  Chesley Mires, MD [facility] Pulmonary/Critical Care 07/25/2015, 11:15 PM Pager:  [phone]

## 2015-07-28 ENCOUNTER — Ambulatory Visit: Payer: Self-pay | Admitting: Hematology and Oncology

## 2015-07-28 ENCOUNTER — Ambulatory Visit: Payer: Self-pay

## 2015-08-02 ENCOUNTER — Ambulatory Visit: Payer: Self-pay | Admitting: Pulmonary Disease

## 2015-08-03 ENCOUNTER — Other Ambulatory Visit: Payer: Self-pay | Admitting: Pulmonary Disease

## 2015-08-04 ENCOUNTER — Ambulatory Visit: Payer: Self-pay

## 2015-08-04 ENCOUNTER — Other Ambulatory Visit: Payer: Self-pay | Admitting: Obstetrics and Gynecology

## 2015-08-09 NOTE — Patient Instructions (Addendum)
Your procedure is scheduled on:  Wednesday, Aug 17, 2015  Enter through the [employer] [facility] at:  11:00 AM  Pick up the phone at the desk and dial [phone].  Call this number if you have problems the morning of surgery: [phone].  Remember:  Do NOT eat food:  After Midnight Tuesday  Do NOT drink clear liquids after:  8:30 AM day of surgery  Take these medicines the morning of surgery with a SIP OF WATER:  Amlodipine, Metoprolol, Topiramate, Lyrica, Prozac, and Clonazepam if needed  Bring Asthma Inhaler day of surgery.  Do NOT wear jewelry (body piercing), metal hair clips/bobby pins, make-up, or nail polish. Do NOT wear lotions, powders, or perfumes.  You may wear deodorant. Do NOT shave for 48 hours prior to surgery. Do NOT bring valuables to the hospital. Contacts, dentures, or bridgework may not be worn into surgery.  Have a responsible adult drive you home and stay with you for 24 hours after your procedure  Bring CPAP machine day of surgery

## 2015-08-10 ENCOUNTER — Other Ambulatory Visit: Payer: Self-pay | Admitting: Pulmonary Disease

## 2015-08-10 DIAGNOSIS — N390 Urinary tract infection, site not specified: Secondary | ICD-10-CM

## 2015-08-10 DIAGNOSIS — D259 Leiomyoma of uterus, unspecified: Secondary | ICD-10-CM

## 2015-08-10 DIAGNOSIS — R110 Nausea: Secondary | ICD-10-CM

## 2015-08-10 MED ORDER — BUDESONIDE 0.5 MG/2ML IN SUSP
0.5000 mg | Freq: Two times a day (BID) | RESPIRATORY_TRACT | Status: DC
Start: 2015-08-10 — End: 2015-08-11

## 2015-08-10 NOTE — Telephone Encounter (Signed)
[facility] needing Rx refaxed for Budesonide .5mg /7mL neb solution with diagnosis code.  Rx sent. Nothing further needed.

## 2015-08-11 ENCOUNTER — Encounter (HOSPITAL_COMMUNITY): Payer: Self-pay

## 2015-08-11 ENCOUNTER — Other Ambulatory Visit: Payer: Self-pay | Admitting: Adult Health

## 2015-08-11 ENCOUNTER — Encounter (HOSPITAL_COMMUNITY)
Admission: RE | Admit: 2015-08-11 | Discharge: 2015-08-11 | Disposition: A | Discharge status: Home / Self Care | Payer: Medicare Other | Source: Ambulatory Visit | Attending: Obstetrics and Gynecology | Admitting: Obstetrics and Gynecology

## 2015-08-11 DIAGNOSIS — D259 Leiomyoma of uterus, unspecified: Secondary | ICD-10-CM

## 2015-08-11 DIAGNOSIS — F419 Anxiety disorder, unspecified: Secondary | ICD-10-CM | POA: Insufficient documentation

## 2015-08-11 DIAGNOSIS — F329 Major depressive disorder, single episode, unspecified: Secondary | ICD-10-CM | POA: Diagnosis not present

## 2015-08-11 DIAGNOSIS — E669 Obesity, unspecified: Secondary | ICD-10-CM | POA: Insufficient documentation

## 2015-08-11 DIAGNOSIS — D649 Anemia, unspecified: Secondary | ICD-10-CM | POA: Insufficient documentation

## 2015-08-11 DIAGNOSIS — N926 Irregular menstruation, unspecified: Secondary | ICD-10-CM | POA: Insufficient documentation

## 2015-08-11 DIAGNOSIS — J45909 Unspecified asthma, uncomplicated: Secondary | ICD-10-CM | POA: Insufficient documentation

## 2015-08-11 DIAGNOSIS — I10 Essential (primary) hypertension: Secondary | ICD-10-CM | POA: Insufficient documentation

## 2015-08-11 DIAGNOSIS — G629 Polyneuropathy, unspecified: Secondary | ICD-10-CM | POA: Diagnosis not present

## 2015-08-11 DIAGNOSIS — Z01812 Encounter for preprocedural laboratory examination: Secondary | ICD-10-CM | POA: Insufficient documentation

## 2015-08-11 DIAGNOSIS — Z6841 Body Mass Index (BMI) 40.0 and over, adult: Secondary | ICD-10-CM | POA: Diagnosis not present

## 2015-08-11 DIAGNOSIS — G4733 Obstructive sleep apnea (adult) (pediatric): Secondary | ICD-10-CM | POA: Insufficient documentation

## 2015-08-11 DIAGNOSIS — K219 Gastro-esophageal reflux disease without esophagitis: Secondary | ICD-10-CM | POA: Diagnosis not present

## 2015-08-11 DIAGNOSIS — R110 Nausea: Secondary | ICD-10-CM

## 2015-08-11 DIAGNOSIS — N390 Urinary tract infection, site not specified: Secondary | ICD-10-CM

## 2015-08-11 HISTORY — DX: Reserved for inherently not codable concepts without codable children: IMO0001

## 2015-08-11 HISTORY — DX: Unspecified osteoarthritis, unspecified site: M19.90

## 2015-08-11 HISTORY — DX: Malignant (primary) neoplasm, unspecified: C80.1

## 2015-08-11 HISTORY — DX: Bacterial foodborne intoxication, unspecified: A05.9

## 2015-08-11 HISTORY — DX: Inflammatory liver disease, unspecified: K75.9

## 2015-08-11 HISTORY — DX: Gastro-esophageal reflux disease without esophagitis: K21.9

## 2015-08-11 HISTORY — DX: Polyneuropathy, unspecified: G62.9

## 2015-08-11 LAB — BASIC METABOLIC PANEL
ANION GAP: 5 (ref 5–15)
BUN: 10 mg/dL (ref 6–20)
CHLORIDE: 111 mmol/L (ref 101–111)
CO2: 24 mmol/L (ref 22–32)
Calcium: 8.9 mg/dL (ref 8.9–10.3)
Creatinine, Ser: 0.71 mg/dL (ref 0.44–1.00)
GFR calc Af Amer: >60 mL/min (ref >60)
GFR calc non Af Amer: >60 mL/min (ref >60)
GLUCOSE: 103 mg/dL — AB (ref 65–99)
POTASSIUM: 3.1 mmol/L — AB (ref 3.5–5.1)
Sodium: 140 mmol/L (ref 135–145)

## 2015-08-11 LAB — CBC
HEMATOCRIT: 34.1 % — AB (ref 36.0–46.0)
HEMOGLOBIN: 10.4 g/dL — AB (ref 12.0–15.0)
MCH: 21.9 pg — ABNORMAL LOW (ref 26.0–34.0)
MCHC: 30.5 g/dL (ref 30.0–36.0)
MCV: 71.8 fL — ABNORMAL LOW (ref 78.0–100.0)
Platelets: 343 10*3/uL (ref 150–400)
RBC: 4.75 MIL/uL (ref 3.87–5.11)
RDW: 17.5 % — ABNORMAL HIGH (ref 11.5–15.5)
WBC: 10.7 10*3/uL — AB (ref 4.0–10.5)

## 2015-08-11 MED ORDER — BUDESONIDE 0.5 MG/2ML IN SUSP: 0.5000 mg | Freq: Two times a day (BID) | RESPIRATORY_TRACT | Status: DC

## 2015-08-11 NOTE — Telephone Encounter (Signed)
Received denial of budesonide due to no diagnosis code. I resent medication with diagnosis code J45.909 ( chronic asthma). Nothing further needed. Rx resent.

## 2015-08-12 DIAGNOSIS — K7581 Nonalcoholic steatohepatitis (NASH): Secondary | ICD-10-CM | POA: Insufficient documentation

## 2015-08-16 NOTE — H&P (Signed)
Admission History and Physical Exam for a Gynecology Patient  [name] is a 40 y.o. female, [id number], who presents for hysteroscopy and D&C. She has been followed at the [facility] and Gynecology division of [facility]. The patient complains of irregular menstrual cycles.  It is difficult to do a thorough examination in the office.  OB History    Gravida Para Term Preterm AB TAB SAB Ectopic Multiple Living   3 3 3       3       Past Medical History  Diagnosis Date  . Allergy     seasonal  . Anemia     goes to [facility], ferahem  . Asthma   . Spinal stenosis   . Peripheral neuropathy ([place])   . Migraines   . Hypertension   . OSA (obstructive sleep apnea)   . Depression   . Anxiety   . Obesity   . OSA on CPAP   . Neuropathy ([place])   . Shortness of breath dyspnea   . GERD (gastroesophageal reflux disease)   . Fibromyalgia   . Arthritis   . Cancer [facility])     Cervical cancer  . Food poisoning   . 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 Wisdom tooth extraction    . Mandible surgery  2008  . Carpal tunnel release    . Cesarean section      1994, 1996   . Cesarean section  11/28/2011    Procedure: CESAREAN SECTION;  Surgeon: Woodroe Mode, MD;  Location: [facility];  Service: Obstetrics;  Laterality: N/A;  . Colposcopy      Allergies  Allergen Reactions  . Cymbalta [Duloxetine Hcl] Other (See Comments)    Head felt cold and tight, loss of conciousness  . Latex Itching, Swelling and Other (See Comments)    Reaction:  Blisters   . Morphine And Related Itching    Family History: family history includes Depression in her daughter; Schizophrenia in her son; Sickle cell anemia in her other; Sickle cell trait in her maternal aunt.  Social History:  reports that she has never smoked. She has never used smokeless tobacco. She reports that  she does not drink alcohol or use illicit drugs.  Review of systems: See HPI.  Admission Physical Exam:    BMI equals 53.2  Last menstrual period 07/14/2015.  HEENT:                 Within normal limits Chest:                   Clear Heart:                    Regular rate and rhythm Breasts:                No masses, skin changes, bleeding, or discharge present Abdomen:             Nontender, no masses Extremities:          Grossly normal Neurologic exam: Grossly normal  Pelvic exam:  External genitalia: normal general appearance Vaginal: normal without tenderness, induration or masses Cervix: normal appearance Adnexa: normal bimanual exam Uterus: Difficult to outline pelvic examination because BMI equals 53.2  Assessment:  Menorrhagia  BMI equals 53.2  Hypertension  Asthma  Fibromyalgia  Plan:  The patient will undergo hysteroscopy and D&C.  She understands the indications for her surgical procedure.  She accepts the risks of, but not limited to, anesthetic complications, bleeding, infections, and possible damage to the surrounding organs.   Eli Hose 08/16/2015

## 2015-08-17 ENCOUNTER — Encounter (HOSPITAL_COMMUNITY)
Admission: RE | Disposition: A | Discharge status: Home / Self Care | Payer: Self-pay | Source: Ambulatory Visit | Attending: Obstetrics and Gynecology

## 2015-08-17 ENCOUNTER — Ambulatory Visit (HOSPITAL_COMMUNITY)
Admission: RE | Admit: 2015-08-17 | Discharge: 2015-08-17 | Disposition: A | Discharge status: Home / Self Care | Payer: Medicare Other | Source: Ambulatory Visit | Attending: Obstetrics and Gynecology | Admitting: Obstetrics and Gynecology

## 2015-08-17 ENCOUNTER — Encounter (HOSPITAL_COMMUNITY): Payer: Self-pay | Admitting: Anesthesiology

## 2015-08-17 ENCOUNTER — Ambulatory Visit (HOSPITAL_COMMUNITY): Payer: Medicare Other | Admitting: Anesthesiology

## 2015-08-17 DIAGNOSIS — D649 Anemia, unspecified: Secondary | ICD-10-CM | POA: Insufficient documentation

## 2015-08-17 DIAGNOSIS — Z6841 Body Mass Index (BMI) 40.0 and over, adult: Secondary | ICD-10-CM | POA: Diagnosis not present

## 2015-08-17 DIAGNOSIS — J45909 Unspecified asthma, uncomplicated: Secondary | ICD-10-CM | POA: Insufficient documentation

## 2015-08-17 DIAGNOSIS — I10 Essential (primary) hypertension: Secondary | ICD-10-CM | POA: Insufficient documentation

## 2015-08-17 DIAGNOSIS — N920 Excessive and frequent menstruation with regular cycle: Secondary | ICD-10-CM | POA: Diagnosis not present

## 2015-08-17 DIAGNOSIS — G4730 Sleep apnea, unspecified: Secondary | ICD-10-CM | POA: Insufficient documentation

## 2015-08-17 DIAGNOSIS — D509 Iron deficiency anemia, unspecified: Secondary | ICD-10-CM

## 2015-08-17 DIAGNOSIS — M797 Fibromyalgia: Secondary | ICD-10-CM | POA: Insufficient documentation

## 2015-08-17 DIAGNOSIS — J4540 Moderate persistent asthma, uncomplicated: Secondary | ICD-10-CM

## 2015-08-17 DIAGNOSIS — E876 Hypokalemia: Secondary | ICD-10-CM

## 2015-08-17 DIAGNOSIS — G4733 Obstructive sleep apnea (adult) (pediatric): Secondary | ICD-10-CM

## 2015-08-17 HISTORY — PX: HYSTEROSCOPY W/D&C: SHX1775

## 2015-08-17 LAB — PREGNANCY, URINE: PREG TEST UR: NEGATIVE

## 2015-08-17 SURGERY — DILATATION AND CURETTAGE /HYSTEROSCOPY
Anesthesia: General | Site: Vagina

## 2015-08-17 MED ORDER — FENTANYL CITRATE (PF) 250 MCG/5ML IJ SOLN
INTRAMUSCULAR | Status: AC
  Filled 2015-08-17: qty 5

## 2015-08-17 MED ORDER — FENTANYL CITRATE (PF) 100 MCG/2ML IJ SOLN
INTRAMUSCULAR | Status: DC | PRN
  Administered 2015-08-17: 50 ug via INTRAVENOUS

## 2015-08-17 MED ORDER — ONDANSETRON HCL 4 MG/2ML IJ SOLN: 4.0000 mg | Freq: Once | INTRAMUSCULAR | Status: DC | PRN

## 2015-08-17 MED ORDER — ONDANSETRON HCL 4 MG/2ML IJ SOLN
INTRAMUSCULAR | Status: DC | PRN
Start: 2015-08-17 — End: 2015-08-17
  Administered 2015-08-17: 4 mg via INTRAVENOUS

## 2015-08-17 MED ORDER — KETOROLAC TROMETHAMINE 30 MG/ML IJ SOLN
INTRAMUSCULAR | Status: DC | PRN
  Administered 2015-08-17: 30 mg via INTRAVENOUS

## 2015-08-17 MED ORDER — PROPOFOL 10 MG/ML IV BOLUS
INTRAVENOUS | Status: DC | PRN
  Administered 2015-08-17: 180 mg via INTRAVENOUS

## 2015-08-17 MED ORDER — FENTANYL CITRATE (PF) 100 MCG/2ML IJ SOLN: 25.0000 ug | INTRAMUSCULAR | Status: DC | PRN

## 2015-08-17 MED ORDER — DEXAMETHASONE SODIUM PHOSPHATE 4 MG/ML IJ SOLN
INTRAMUSCULAR | Status: AC
  Filled 2015-08-17: qty 1

## 2015-08-17 MED ORDER — DEXAMETHASONE SODIUM PHOSPHATE 10 MG/ML IJ SOLN
INTRAMUSCULAR | Status: DC | PRN
  Administered 2015-08-17: 4 mg via INTRAVENOUS

## 2015-08-17 MED ORDER — LACTATED RINGERS IV SOLN
INTRAVENOUS | Status: DC
  Administered 2015-08-17 (×3): via INTRAVENOUS

## 2015-08-17 MED ORDER — MEPERIDINE HCL 25 MG/ML IJ SOLN: 6.2500 mg | INTRAMUSCULAR | Status: DC | PRN

## 2015-08-17 MED ORDER — SODIUM CHLORIDE 0.9 % IR SOLN
Status: DC | PRN
  Administered 2015-08-17: 3000 mL

## 2015-08-17 MED ORDER — IBUPROFEN 100 MG/5ML PO SUSP: 200.0000 mg | Freq: Four times a day (QID) | ORAL | Status: DC | PRN

## 2015-08-17 MED ORDER — BUPIVACAINE-EPINEPHRINE (PF) 0.5% -1:200000 IJ SOLN
INTRAMUSCULAR | Status: AC
  Filled 2015-08-17: qty 30

## 2015-08-17 MED ORDER — LIDOCAINE HCL (CARDIAC) 20 MG/ML IV SOLN
INTRAVENOUS | Status: DC | PRN
Start: 2015-08-17 — End: 2015-08-17
  Administered 2015-08-17: 60 mg via INTRAVENOUS

## 2015-08-17 MED ORDER — KETOROLAC TROMETHAMINE 30 MG/ML IJ SOLN: 30.0000 mg | Freq: Once | INTRAMUSCULAR | Status: DC

## 2015-08-17 MED ORDER — PROPOFOL 10 MG/ML IV BOLUS
INTRAVENOUS | Status: AC
  Filled 2015-08-17: qty 20

## 2015-08-17 MED ORDER — LIDOCAINE HCL (CARDIAC) 20 MG/ML IV SOLN
INTRAVENOUS | Status: AC
  Filled 2015-08-17: qty 5

## 2015-08-17 MED ORDER — MIDAZOLAM HCL 2 MG/2ML IJ SOLN
INTRAMUSCULAR | Status: AC
  Filled 2015-08-17: qty 2

## 2015-08-17 MED ORDER — ONDANSETRON HCL 4 MG/2ML IJ SOLN
INTRAMUSCULAR | Status: AC
  Filled 2015-08-17: qty 2

## 2015-08-17 MED ORDER — SCOPOLAMINE 1 MG/3DAYS TD PT72: 1.0000 | MEDICATED_PATCH | Freq: Once | TRANSDERMAL | Status: DC

## 2015-08-17 MED ORDER — MIDAZOLAM HCL 2 MG/2ML IJ SOLN
INTRAMUSCULAR | Status: DC | PRN
  Administered 2015-08-17: 1 mg via INTRAVENOUS

## 2015-08-17 MED ORDER — IBUPROFEN 200 MG PO TABS
200.0000 mg | ORAL_TABLET | Freq: Four times a day (QID) | ORAL | Status: DC | PRN
  Filled 2015-08-17: qty 2

## 2015-08-17 MED ORDER — BUPIVACAINE-EPINEPHRINE 0.5% -1:200000 IJ SOLN
INTRAMUSCULAR | Status: DC | PRN
  Administered 2015-08-17: 10 mL

## 2015-08-17 MED ORDER — HYDROMORPHONE HCL 2 MG PO TABS: 2.0000 mg | ORAL_TABLET | ORAL | Status: DC | PRN

## 2015-08-17 MED ORDER — KETOROLAC TROMETHAMINE 30 MG/ML IJ SOLN
INTRAMUSCULAR | Status: AC
  Filled 2015-08-17: qty 1

## 2015-08-17 SURGICAL SUPPLY — 21 items
CANISTER SUCT 3000ML (MISCELLANEOUS) ×3 IMPLANT
CATH FOLEY LATEX FREE 14FR (CATHETERS) ×2
CATH FOLEY LF 14FR (CATHETERS) ×1 IMPLANT
CLOTH BEACON ORANGE TIMEOUT ST (SAFETY) ×3 IMPLANT
CONTAINER PREFILL 10% NBF 60ML (FORM) ×6 IMPLANT
ELECT REM PT RETURN 9FT ADLT (ELECTROSURGICAL) ×3
ELECTRODE REM PT RTRN 9FT ADLT (ELECTROSURGICAL) ×1 IMPLANT
GLOVE BIOGEL PI IND STRL 7.0 (GLOVE) ×1 IMPLANT
GLOVE BIOGEL PI IND STRL 8.5 (GLOVE) ×1 IMPLANT
GLOVE BIOGEL PI INDICATOR 7.0 (GLOVE) ×2
GLOVE BIOGEL PI INDICATOR 8.5 (GLOVE) ×2
GLOVE ECLIPSE 8.0 STRL XLNG CF (GLOVE) ×6 IMPLANT
GLOVE SURG SS PI 8.0 STRL IVOR (GLOVE) ×3 IMPLANT
GOWN STRL REUS W/TWL LRG LVL3 (GOWN DISPOSABLE) ×6 IMPLANT
LOOP ANGLED CUTTING 22FR (CUTTING LOOP) IMPLANT
PACK VAGINAL MINOR WOMEN LF (CUSTOM PROCEDURE TRAY) ×3 IMPLANT
PAD OB MATERNITY 4.3X12.25 (PERSONAL CARE ITEMS) ×3 IMPLANT
TOWEL OR 17X24 6PK STRL BLUE (TOWEL DISPOSABLE) ×6 IMPLANT
TUBING AQUILEX INFLOW (TUBING) ×3 IMPLANT
TUBING AQUILEX OUTFLOW (TUBING) ×3 IMPLANT
WATER STERILE IRR 1000ML POUR (IV SOLUTION) ×3 IMPLANT

## 2015-08-17 NOTE — Discharge Instructions (Signed)
Dilation and Curettage or Vacuum Curettage Dilation and curettage (D&C) and vacuum curettage are minor procedures. A D&C involves stretching (dilation) the cervix and scraping (curettage) the inside lining of the womb (uterus). During a D&C, tissue is gently scraped from the inside lining of the uterus. During a vacuum curettage, the lining and tissue in the uterus are removed with the use of gentle suction.  Curettage may be performed to either diagnose or treat a problem. As a diagnostic procedure, curettage is performed to examine tissues from the uterus. A diagnostic curettage may be performed for the following symptoms:   Irregular bleeding in the uterus.   Bleeding with the development of clots.   Spotting between menstrual periods.   Prolonged menstrual periods.   Bleeding after menopause.   No menstrual period (amenorrhea).   A change in size and shape of the uterus.  As a treatment procedure, curettage may be performed for the following reasons:   Removal of an IUD (intrauterine device).   Removal of retained placenta after giving birth. Retained placenta can cause an infection or bleeding severe enough to require transfusions.   Abortion.   Miscarriage.   Removal of polyps inside the uterus.   Removal of uncommon types of noncancerous lumps (fibroids).  LET [facility] CARE PROVIDER KNOW ABOUT:   Any allergies you have.   All medicines you are taking, including vitamins, herbs, eye drops, creams, and over-the-counter medicines.   Previous problems you or members of your family have had with the use of anesthetics.   Any blood disorders you have.   Previous surgeries you have had.   Medical conditions you have. RISKS AND COMPLICATIONS  Generally, this is a safe procedure. However, as with any procedure, complications can occur. Possible complications include:  Excessive bleeding.   Infection of the uterus.   Damage to the cervix.    Development of scar tissue (adhesions) inside the uterus, later causing abnormal amounts of menstrual bleeding.   Complications from the general anesthetic, if a general anesthetic is used.   Putting a hole (perforation) in the uterus. This is rare.  BEFORE THE PROCEDURE   Eat and drink before the procedure only as directed by your health care provider.   Arrange for someone to take you home.  PROCEDURE  This procedure usually takes about 15-30 minutes.  You will be given one of the following:  A medicine that numbs the area in and around the cervix (local anesthetic).   A medicine to make you sleep through the procedure (general anesthetic).  You will lie on your back with your legs in stirrups.   A warm metal or plastic instrument (speculum) will be placed in your vagina to keep it open and to allow the health care provider to see the cervix.  There are two ways in which your cervix can be softened and dilated. These include:   Taking a medicine.   Having thin rods (laminaria) inserted into your cervix.   A curved tool (curette) will be used to scrape cells from the inside lining of the uterus. In some cases, gentle suction is applied with the curette. The curette will then be removed.  AFTER THE PROCEDURE   You will rest in the recovery area until you are stable and are ready to go home.   You may feel sick to your stomach (nauseous) or throw up (vomit) if you were given a general anesthetic.   You may have a sore throat if a tube  was placed in your throat during general anesthesia.   You may have light cramping and bleeding. This may last for 2 days to 2 weeks after the procedure.   Your uterus needs to make a new lining after the procedure. This may make your next period late.   This information is not intended to replace advice given to you by your health care provider. Make sure you discuss any questions you have with your health care provider.    Document Released: 04/02/2005 Document Revised: 12/03/2012 Document Reviewed: 10/30/2012 Elsevier Interactive Patient Education 2016 [place] Anesthesia Home Care Instructions  Activity: Get plenty of rest for the remainder of the day. A responsible adult should stay with you for 24 hours following the procedure.  For the next 24 hours, DO NOT: -Drive a car -Paediatric nurse -Drink alcoholic beverages -Take any medication unless instructed by your physician -Make any legal decisions or sign important papers.  Meals: Start with liquid foods such as gelatin or soup. Progress to regular foods as tolerated. Avoid greasy, spicy, heavy foods. If nausea and/or vomiting occur, drink only clear liquids until the nausea and/or vomiting subsides. Call your physician if vomiting continues.  Special Instructions/Symptoms: Your throat may feel dry or sore from the anesthesia or the breathing tube placed in your throat during surgery. If this causes discomfort, gargle with warm salt water. The discomfort should disappear within 24 hours.  If you had a scopolamine patch placed behind your ear for the management of post- operative nausea and/or vomiting:  1. The medication in the patch is effective for 72 hours, after which it should be removed.  Wrap patch in a tissue and discard in the trash. Wash hands thoroughly with soap and water. 2. You may remove the patch earlier than 72 hours if you experience unpleasant side effects which may include dry mouth, dizziness or visual disturbances. 3. Avoid touching the patch. Wash your hands with soap and water after contact with the patch.    Post Anesthesia Home Care Instructions  Activity: Get plenty of rest for the remainder of the day. A responsible adult should stay with you for 24 hours following the procedure.  For the next 24 hours, DO NOT: -Drive a car -Paediatric nurse -Drink alcoholic beverages -Take any medication unless  instructed by your physician -Make any legal decisions or sign important papers.  Meals: Start with liquid foods such as gelatin or soup. Progress to regular foods as tolerated. Avoid greasy, spicy, heavy foods. If nausea and/or vomiting occur, drink only clear liquids until the nausea and/or vomiting subsides. Call your physician if vomiting continues.  Special Instructions/Symptoms: Your throat may feel dry or sore from the anesthesia or the breathing tube placed in your throat during surgery. If this causes discomfort, gargle with warm salt water. The discomfort should disappear within 24 hours.

## 2015-08-17 NOTE — Anesthesia Preprocedure Evaluation (Signed)
Anesthesia Evaluation  Patient identified by MRN, date of birth, ID band Patient awake    Reviewed: Allergy & Precautions, H&P , NPO status , Patient's Chart, lab work & pertinent test results, reviewed documented beta blocker date and time   Airway Mallampati: III  TM Distance: >3 FB Neck ROM: full    Dental no notable dental hx. (+) Teeth Intact   Pulmonary  CPAP of 44mm. Will use inhaler prior to OR!   Pulmonary exam normal        Cardiovascular hypertension, Pt. on home beta blockers Normal cardiovascular exam     Neuro/Psych    GI/Hepatic   Endo/Other  Morbid obesity  Renal/GU negative Renal ROS     Musculoskeletal   Abdominal (+) + obese,   Peds  Hematology   Anesthesia Other Findings   Reproductive/Obstetrics negative OB ROS                             Anesthesia Physical Anesthesia Plan  ASA: III  Anesthesia Plan: General   Post-op Pain Management:    Induction: Intravenous  Airway Management Planned: Oral ETT  Additional Equipment:   Intra-op Plan:   Post-operative Plan:   Informed Consent: I have reviewed the patients History and Physical, chart, labs and discussed the procedure including the risks, benefits and alternatives for the proposed anesthesia with the patient or authorized representative who has indicated his/her understanding and acceptance.   Dental Advisory Given  Plan Discussed with: CRNA and Surgeon  Anesthesia Plan Comments: (Proseal LMA.)        Anesthesia Quick Evaluation

## 2015-08-17 NOTE — Anesthesia Procedure Notes (Signed)
Procedure Name: LMA Insertion Date/Time: 08/17/2015 4:51 PM Performed by: Casimer Lanius A Pre-anesthesia Checklist: Patient being monitored, Patient identified, Emergency Drugs available and Suction available Patient Re-evaluated:Patient Re-evaluated prior to inductionOxygen Delivery Method: Circle system utilized Preoxygenation: Pre-oxygenation with 100% oxygen Intubation Type: IV induction and Inhalational induction Ventilation: Mask ventilation without difficulty LMA: LMA inserted and LMA with gastric port inserted LMA Size: 3.0 Number of attempts: 1 Dental Injury: Teeth and Oropharynx as per pre-operative assessment

## 2015-08-17 NOTE — Op Note (Signed)
OPERATIVE NOTE  Cheyenne Arias  DOB:    03-Oct-1975  MRN:    [id number]  CSN:    [id number]  Date of Surgery:  08/17/2015  Preoperative Diagnosis:  Irregular cycles  Obesity  Anemia  Sleep apnea  Difficult examination  Fibromyalgia  Pelvic pain  Postoperative Diagnosis:  Same  Procedure:  Hysteroscopy Dilatation and curettage  Surgeon:  Gildardo Cranker, M.D.  Assistant:  None  Anesthetic:  General  Disposition:  The patient is a 40 y.o.-year-old female who presents with the above-mentioned diagnosis. We are unable to sample her endometrium in the office. She understands the indications for her surgical procedure. She accepts the risk of, but not limited to, anesthetic complications, bleeding, infections, and possible damage to the surrounding organs.  Findings:  On examination under anesthesia the uterus was difficult to examine because of her obesity. No adnexal masses were appreciated. No parametrial disease was appreciated. The uterus sounded to 9 cm. The patient was noted to have a normal endometrial cavity..  Procedure:  The patient was taken to the operating room where a general anesthetic was given. The perineum and vagina were prepped with Betadine. The bladder was drained of urine. The patient was sterilely draped. Examination under anesthesia was performed. An endocervical curettage was performed. The cervix was gently dilated. The diagnostic hysteroscope was inserted and the cavity was carefully inspected. Pictures were taken. Findings included: A normal endometrial cavity. The diagnostic hysteroscope was removed. The cervix was dilated further. The cavity was then curetted using a sharp curet. The cavity was felt to be clean at the end of our procedure. Hemostasis was adequate. All instruments were removed. The examination was repeated and the uterus was noted to be firm. Sponge, and needle counts were correct. The estimated blood loss for the  procedure was 10 cc. The estimated fluid deficit loss 125 cc. The patient was awakened from her anesthetic without difficulty. She was returned to the supine position and and transported to the recovery room in stable condition. The endocervical curettings, and endometrial curettings were sent to pathology.  Followup instructions:  The patient will return to see Dr. Raphael Gibney in 2 weeks. She was given a copy of the postoperative instructions for patients who've undergone hysteroscopy.  Discharge medications:  Motrin 800 mg every 8 hours as needed for mild to moderate pain. Dilauded 2 mg one tablet every 4 hours as needed for severe pain (number 4).  Gildardo Cranker, M.D.  08/17/2015

## 2015-08-17 NOTE — Transfer of Care (Signed)
Immediate Anesthesia Transfer of Care Note  Patient: Cheyenne Arias  Procedure(s) Performed: Procedure(s): DILATATION AND CURETTAGE /HYSTEROSCOPY (N/A)  Patient Location: PACU  Anesthesia Type:General  Level of Consciousness: awake  Airway & Oxygen Therapy: Patient Spontanous Breathing  Post-op Assessment: Report given to PACU RN  Post vital signs: stable  Filed Vitals:   08/17/15 1126  BP: 145/90  Pulse: 113  Temp: 37.3 C  Resp: 20    Complications: No apparent anesthesia complications

## 2015-08-17 NOTE — Anesthesia Postprocedure Evaluation (Signed)
Anesthesia Post Note  Patient: Cheyenne Arias  Procedure(s) Performed: Procedure(s) (LRB): DILATATION AND CURETTAGE /HYSTEROSCOPY (N/A)  Patient location during evaluation: PACU Anesthesia Type: General Level of consciousness: awake Pain management: pain level controlled Vital Signs Assessment: post-procedure vital signs reviewed and stable Respiratory status: spontaneous breathing Cardiovascular status: stable Postop Assessment: no signs of nausea or vomiting Anesthetic complications: no     Last Vitals:  Filed Vitals:   08/17/15 1800 08/17/15 1815  BP: 130/74 140/74  Pulse: 103 100  Temp:    Resp: 18 14    Last Pain:  Filed Vitals:   08/17/15 1825  PainSc: 0-No pain   Pain Goal:                 Laurella Tull A.

## 2015-08-17 NOTE — Progress Notes (Signed)
The patient was interviewed and examined today.  The previously documented history and physical examination was reviewed. There are no changes. The operative procedure was reviewed. The risks and benefits were outlined again. The specific risks include, but are not limited to, anesthetic complications, bleeding, infections, and possible damage to the surrounding organs. The patient's questions were answered.  We are ready to proceed as outlined. The likelihood of the patient achieving the goals of this procedure is very likely.   BP 145/90 mmHg  Pulse 113  Temp(Src) 99.1 F (37.3 C) (Oral)  Resp 20  SpO2 100%  LMP 07/14/2015 CBC    Component Value Date/Time   WBC 10.7* 08/11/2015 0950   WBC 7.7 07/21/2015 1113   RBC 4.75 08/11/2015 0950   RBC 4.55 07/21/2015 1113   HGB 10.4* 08/11/2015 0950   HGB 10.2* 07/21/2015 1113   HCT 34.1* 08/11/2015 0950   HCT 33.7* 07/21/2015 1113   PLT 343 08/11/2015 0950   PLT 450* 07/21/2015 1113   MCV 71.8* 08/11/2015 0950   MCV 74.1* 07/21/2015 1113   MCH 21.9* 08/11/2015 0950   MCH 22.4* 07/21/2015 1113   MCHC 30.5 08/11/2015 0950   MCHC 30.3* 07/21/2015 1113   RDW 17.5* 08/11/2015 0950   RDW 17.2* 07/21/2015 1113   LYMPHSABS 1.7 07/21/2015 1113   LYMPHSABS 0.9 07/02/2015 0310   MONOABS 0.3 07/21/2015 1113   MONOABS 0.5 07/02/2015 0310   EOSABS 0.1 07/21/2015 1113   EOSABS 0.0 07/02/2015 0310   BASOSABS 0.0 07/21/2015 1113   BASOSABS 0.0 07/02/2015 0310    Results for orders placed or performed during the hospital encounter of 08/17/15 (from the past 24 hour(s))  Pregnancy, urine     Status: None   Collection Time: 08/17/15 11:00 AM  Result Value Ref Range   Preg Test, Ur NEGATIVE NEGATIVE     Gildardo Cranker, M.D.

## 2015-08-18 ENCOUNTER — Other Ambulatory Visit: Payer: Self-pay

## 2015-08-18 ENCOUNTER — Encounter (HOSPITAL_COMMUNITY): Payer: Self-pay | Admitting: Obstetrics and Gynecology

## 2015-09-16 ENCOUNTER — Ambulatory Visit: Payer: Self-pay | Admitting: Adult Health

## 2015-09-19 ENCOUNTER — Telehealth: Payer: Self-pay | Admitting: Hematology and Oncology

## 2015-09-19 NOTE — Telephone Encounter (Signed)
lvm for pt regarding to labs to be no less that 3 day prior to md and iron.Marland KitchenMarland KitchenMarland Kitchen

## 2015-09-20 ENCOUNTER — Ambulatory Visit: Payer: Self-pay

## 2015-09-20 ENCOUNTER — Ambulatory Visit: Payer: Self-pay | Admitting: Hematology and Oncology

## 2015-09-20 ENCOUNTER — Other Ambulatory Visit: Payer: Self-pay

## 2015-09-22 ENCOUNTER — Telehealth (HOSPITAL_COMMUNITY): Payer: Self-pay | Admitting: Psychiatry

## 2015-09-22 ENCOUNTER — Other Ambulatory Visit (HOSPITAL_COMMUNITY): Payer: Self-pay | Admitting: Psychiatry

## 2015-09-22 NOTE — Telephone Encounter (Signed)
Refill for Abilify sent to pharmacy on 09/22/15. LVM informing pt of rx status. Pt is schedule for a f/u appt on 6/19.

## 2015-09-22 NOTE — Telephone Encounter (Signed)
Received medication request from [facility] for Abilify 2mg . Per Dr. De Nurse, medication request for Abilify 2mg , #30. Rx was sent to pharmacy. Called and informed pt of Rx status. Pt is schedule for f/u appt 10/03/15. Pt verbalizes understanding.

## 2015-09-29 ENCOUNTER — Other Ambulatory Visit (HOSPITAL_BASED_OUTPATIENT_CLINIC_OR_DEPARTMENT_OTHER): Payer: Medicare Other

## 2015-09-29 ENCOUNTER — Telehealth: Payer: Self-pay | Admitting: Hematology and Oncology

## 2015-09-29 DIAGNOSIS — D509 Iron deficiency anemia, unspecified: Secondary | ICD-10-CM

## 2015-09-29 LAB — CBC & DIFF AND RETIC
BASO%: 0.2 % (ref 0.0–2.0)
BASOS ABS: 0 10*3/uL (ref 0.0–0.1)
EOS%: 2.7 % (ref 0.0–7.0)
Eosinophils Absolute: 0.3 10*3/uL (ref 0.0–0.5)
HCT: 33.5 % — ABNORMAL LOW (ref 34.8–46.6)
HGB: 10.1 g/dL — ABNORMAL LOW (ref 11.6–15.9)
IMMATURE RETIC FRACT: 12.2 % — AB (ref 1.60–10.00)
LYMPH%: 19.3 % (ref 14.0–49.7)
MCH: 21 pg — ABNORMAL LOW (ref 25.1–34.0)
MCHC: 30.1 g/dL — ABNORMAL LOW (ref 31.5–36.0)
MCV: 69.6 fL — AB (ref 79.5–101.0)
MONO#: 0.5 10*3/uL (ref 0.1–0.9)
MONO%: 4.2 % (ref 0.0–14.0)
NEUT#: 8.4 10*3/uL — ABNORMAL HIGH (ref 1.5–6.5)
NEUT%: 73.6 % (ref 38.4–76.8)
PLATELETS: 380 10*3/uL (ref 145–400)
RBC: 4.81 10*6/uL (ref 3.70–5.45)
RDW: 17.2 % — AB (ref 11.2–14.5)
RETIC CT ABS: 56.76 10*3/uL (ref 33.70–90.70)
Retic %: 1.18 % (ref 0.70–2.10)
WBC: 11.5 10*3/uL — ABNORMAL HIGH (ref 3.9–10.3)
lymph#: 2.2 10*3/uL (ref 0.9–3.3)

## 2015-09-29 NOTE — Telephone Encounter (Signed)
s.w. pt and advised on labs today....pt ok and aware

## 2015-09-30 ENCOUNTER — Telehealth: Payer: Self-pay | Admitting: *Deleted

## 2015-09-30 ENCOUNTER — Other Ambulatory Visit: Payer: Self-pay | Admitting: Hematology and Oncology

## 2015-09-30 DIAGNOSIS — D509 Iron deficiency anemia, unspecified: Secondary | ICD-10-CM

## 2015-09-30 LAB — FERRITIN: Ferritin: 141 ng/ml (ref 9–269)

## 2015-09-30 NOTE — Telephone Encounter (Signed)
No note

## 2015-09-30 NOTE — Telephone Encounter (Signed)
Spoke with patient, labs good no need for iron 10/03/15 will re-schedule for 3 months, patient knows to call for any problems.

## 2015-10-03 ENCOUNTER — Telehealth: Payer: Self-pay | Admitting: Hematology and Oncology

## 2015-10-03 ENCOUNTER — Ambulatory Visit: Payer: Self-pay | Admitting: Hematology and Oncology

## 2015-10-03 ENCOUNTER — Ambulatory Visit: Payer: Self-pay

## 2015-10-03 NOTE — Telephone Encounter (Signed)
s.w. pt and advised on Sept appt pt ok and aware °

## 2015-10-14 ENCOUNTER — Ambulatory Visit (INDEPENDENT_AMBULATORY_CARE_PROVIDER_SITE_OTHER): Payer: Medicare Other | Admitting: Psychiatry

## 2015-10-14 ENCOUNTER — Encounter (HOSPITAL_COMMUNITY): Payer: Self-pay | Admitting: Psychiatry

## 2015-10-14 DIAGNOSIS — F410 Panic disorder [episodic paroxysmal anxiety] without agoraphobia: Secondary | ICD-10-CM

## 2015-10-14 DIAGNOSIS — F0630 Mood disorder due to known physiological condition, unspecified: Secondary | ICD-10-CM | POA: Diagnosis not present

## 2015-10-14 DIAGNOSIS — F331 Major depressive disorder, recurrent, moderate: Secondary | ICD-10-CM

## 2015-10-14 DIAGNOSIS — R110 Nausea: Secondary | ICD-10-CM | POA: Diagnosis not present

## 2015-10-14 MED ORDER — FLUOXETINE HCL 20 MG PO CAPS: 20.0000 mg | ORAL_CAPSULE | Freq: Every day | ORAL | Status: DC

## 2015-10-14 MED ORDER — ARIPIPRAZOLE 2 MG PO TABS: 2.0000 mg | ORAL_TABLET | Freq: Every day | ORAL | Status: DC

## 2015-10-14 MED ORDER — CLONAZEPAM 0.5 MG PO TABS: 0.5000 mg | ORAL_TABLET | Freq: Two times a day (BID) | ORAL | Status: DC | PRN

## 2015-10-14 NOTE — Progress Notes (Signed)
Patient ID: Cheyenne Arias, female   DOB: 08/19/1975, 40 y.o.   MRN: [id number]  [place] Follow up outpatient visit   Cheyenne Arias [id number] 40 y.o.  10/14/2015 10:09 AM  Chief Complaint:  Anxiety, depression. Follow up History of Present Illness:   Patient Presents for follow-up and medication management for major depressive disorder. Panic disorder. PTSD.  Patient is a single African-American female diagnosed with multiple medical conditions. She has 3 kids 2 of which are living with her 16 year old daughter and a 73-year-old son.   As per initial evaluation " Patient has been diagnosed with depression and says possible bipolar . Says that she is on Abilify 2 mg for night terrors and depression. She is also on Klonopin 0.5 mg up to 2 times a day.   Worries about her daughter with lupus. Otherwise tolerating medications to address some sleep issues said that she was on Ambien past. Considering she is on Klonopin, i would refer her to a sleep doctor regarding to use Ambien along with her C Pap machine Mood disorder : gets frustrated infrequently. prozac helps Depression: on prozac. Takes care of her kids. Medical condition including fibromyalgia and spinal stenosis .   Panic condition;vatiable related with stress. Takes klonopine twice a day. Anxiety gets worsened if she lowers the dose Night terrors: takes abilify it helps otherwise she screams and wakes up. It also helps mood symtpoms.. Medical complexity/Data: has OSA uses cpap. Which helps her sleep. Has fibromyalgia which complicates her depression. She continues with topamax and lyrica.  Severity of depression are scheduled and is 6 out of 10. 10 being very happy.   Aggravating factors: [name] Medical conditions. son in prison for armed robbery also include her medical conditions , fibromyalgia and her pain condition includes chronic back and neck pain. She also has a possibility of pituitary tumor that is being  assessed  Modifying factors; some support and home health care  Prior Suicide Attempts: No  Medical History; Past Medical History  Diagnosis Date  . Allergy     seasonal  . Anemia     goes to [facility], ferahem  . Asthma   . Spinal stenosis   . Peripheral neuropathy ([place])   . Migraines   . Hypertension   . OSA (obstructive sleep apnea)   . Depression   . Anxiety   . Obesity   . OSA on CPAP   . Neuropathy ([place])   . Shortness of breath dyspnea   . GERD (gastroesophageal reflux disease)   . Fibromyalgia   . Arthritis   . Cancer [facility])     Cervical cancer  . Food poisoning   . 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 Cymbalta [Duloxetine Hcl] Other (See Comments)    Head felt cold and tight, loss of conciousness  . Latex Itching, Swelling and Other (See Comments)    Reaction:  Blisters   . Morphine And Related Itching    Medications: Outpatient Encounter Prescriptions as of 10/14/2015  Medication Sig  . amLODipine (NORVASC) 10 MG tablet Take 10 mg by mouth daily.  . ARIPiprazole (ABILIFY) 2 MG tablet Take 1 tablet (2 mg total) by mouth daily.  . budesonide (PULMICORT) 0.5 MG/2ML nebulizer solution Take 2 mLs (0.5 mg total) by nebulization 2 (two) times daily. Dx Code: Chronic Asthma J45.909  . clonazePAM (KLONOPIN) 0.5 MG tablet Take 1 tablet (0.5 mg total) by mouth  2 (two) times daily as needed for anxiety.  . cyclobenzaprine (FLEXERIL) 10 MG tablet Take 10 mg by mouth 3 (three) times daily as needed for muscle spasms.   . ferumoxytol (FERAHEME) 510 MG/17ML SOLN injection Inject 510 mg into the vein every 3 (three) months.  [name] FLUoxetine (PROZAC) 20 MG capsule Take 1 capsule (20 mg total) by mouth daily.  . fluticasone (FLONASE) 50 MCG/ACT nasal spray Place 2 sprays into both nostrils 2 (two) times daily.  . furosemide (LASIX) 80 MG tablet Take 80 mg by mouth 2 (two) times daily.  [name] HYDROmorphone (DILAUDID) 2 MG tablet Take  1 tablet (2 mg total) by mouth every 4 (four) hours as needed for severe pain.  [name] ibuprofen (ADVIL,MOTRIN) 800 MG tablet Take 800 mg by mouth 2 (two) times daily.  [name] ipratropium-albuterol (DUONEB) 0.5-2.5 (3) MG/3ML SOLN Take 3 mLs by nebulization every 4 (four) hours as needed (for wheezing/shortness of breath).  . lactulose (CHRONULAC) 10 GM/15ML solution Take 10 g by mouth 3 (three) times daily as needed for mild constipation.  [name] loratadine (CLARITIN) 10 MG tablet Take 1 tablet (10 mg total) by mouth at bedtime.  . metoprolol tartrate (LOPRESSOR) 25 MG tablet Take 25 mg by mouth every morning.   . montelukast (SINGULAIR) 10 MG tablet Take 1 tablet (10 mg total) by mouth at bedtime.  . Multiple Vitamins-Minerals (MULTIVITAMIN WITH MINERALS) tablet Take 1 tablet by mouth daily.  . ondansetron (ZOFRAN) 8 MG tablet Take 1 tablet (8 mg total) by mouth every 8 (eight) hours as needed for nausea or vomiting.  [name] oxyCODONE (ROXICODONE) 15 MG immediate release tablet Take 15 mg by mouth every 6 (six) hours.  . pantoprazole (PROTONIX) 40 MG tablet TAKE 1 TABLET (40 MG TOTAL) BY MOUTH DAILY.  [name] potassium chloride SA (K-DUR,KLOR-CON) 20 MEQ tablet Take 20 mEq by mouth 2 (two) times daily.  . pregabalin (LYRICA) 150 MG capsule Take 225 mg by mouth 2 (two) times daily. Takes 150 mg and 75 mg to equal 225 mg twice daily.  . pregabalin (LYRICA) 75 MG capsule Take 225 mg by mouth 2 (two) times daily. Takes 150 mg and 75 mg to equal 225 mg twice daily.  [name] PROAIR HFA 108 (90 Base) MCG/ACT inhaler INHALE 2 PUFFS INTO THE LUNGS EVERY 6 (SIX) HOURS AS NEEDED FOR WHEEZING OR SHORTNESS OF BREATH.  . rizatriptan (MAXALT) 10 MG tablet Take 10 mg by mouth as needed for migraine. May repeat in 2 hours if needed  . topiramate (TOPAMAX) 100 MG tablet Take 100 mg by mouth 2 (two) times daily.  . [DISCONTINUED] ARIPiprazole (ABILIFY) 2 MG tablet TAKE 1 TABLET BY MOUTH EVERY DAY  . [DISCONTINUED] clonazePAM (KLONOPIN) 0.5 MG  tablet Take 1 tablet (0.5 mg total) by mouth 2 (two) times daily as needed for anxiety.  . [DISCONTINUED] FLUoxetine (PROZAC) 20 MG capsule Take 1 capsule (20 mg total) by mouth daily.   No facility-administered encounter medications on file as of 10/14/2015.     Family History; Family History  Problem Relation Age of Onset  . Sickle cell trait Maternal Aunt   . Sickle cell anemia Other   . Schizophrenia Son   . Depression Daughter        Labs:  Recent Results (from the past 2160 hour(s))  CBC & Diff and Retic     Status: Abnormal   Collection Time: 07/21/15 11:13 AM  Result Value Ref Range   WBC 7.7 3.9 - 10.3 10e3/uL  NEUT# 5.6 1.5 - 6.5 10e3/uL   HGB 10.2 (L) 11.6 - 15.9 g/dL   HCT 33.7 (L) 34.8 - 46.6 %   Platelets 450 (H) 145 - 400 10e3/uL   MCV 74.1 (L) 79.5 - 101.0 fL   MCH 22.4 (L) 25.1 - 34.0 pg   MCHC 30.3 (L) 31.5 - 36.0 g/dL   RBC 4.55 3.70 - 5.45 10e6/uL   RDW 17.2 (H) 11.2 - 14.5 %   lymph# 1.7 0.9 - 3.3 10e3/uL   MONO# 0.3 0.1 - 0.9 10e3/uL   Eosinophils Absolute 0.1 0.0 - 0.5 10e3/uL   Basophils Absolute 0.0 0.0 - 0.1 10e3/uL   NEUT% 72.0 38.4 - 76.8 %   LYMPH% 21.4 14.0 - 49.7 %   MONO% 4.4 0.0 - 14.0 %   EOS% 1.8 0.0 - 7.0 %   BASO% 0.4 0.0 - 2.0 %   Retic % 1.34 0.70 - 2.10 %   Retic Ct Abs 60.97 33.70 - 90.70 10e3/uL   Immature Retic Fract 11.90 (H) 1.60 - 10.00 %  Ferritin     Status: None   Collection Time: 07/21/15 11:13 AM  Result Value Ref Range   Ferritin 148 9 - 269 ng/ml  CBC     Status: Abnormal   Collection Time: 08/11/15  9:50 AM  Result Value Ref Range   WBC 10.7 (H) 4.0 - 10.5 K/uL   RBC 4.75 3.87 - 5.11 MIL/uL   Hemoglobin 10.4 (L) 12.0 - 15.0 g/dL   HCT 34.1 (L) 36.0 - 46.0 %   MCV 71.8 (L) 78.0 - 100.0 fL   MCH 21.9 (L) 26.0 - 34.0 pg   MCHC 30.5 30.0 - 36.0 g/dL   RDW 17.5 (H) 11.5 - 15.5 %   Platelets 343 150 - 400 K/uL  Basic metabolic panel     Status: Abnormal   Collection Time: 08/11/15  9:50 AM  Result Value  Ref Range   Sodium 140 135 - 145 mmol/L   Potassium 3.1 (L) 3.5 - 5.1 mmol/L   Chloride 111 101 - 111 mmol/L   CO2 24 22 - 32 mmol/L   Glucose, Bld 103 (H) 65 - 99 mg/dL   BUN 10 6 - 20 mg/dL   Creatinine, Ser 0.71 0.44 - 1.00 mg/dL   Calcium 8.9 8.9 - 10.3 mg/dL   GFR calc non Af Amer >60 >60 mL/min   GFR calc Af Amer >60 >60 mL/min    Comment: (NOTE) The eGFR has been calculated using the CKD EPI equation. This calculation has not been validated in all clinical situations. eGFR's persistently <60 mL/min signify possible Chronic Kidney Disease.    Anion gap 5 5 - 15  Pregnancy, urine     Status: None   Collection Time: 08/17/15 11:00 AM  Result Value Ref Range   Preg Test, Ur NEGATIVE NEGATIVE    Comment:        THE SENSITIVITY OF THIS METHODOLOGY IS >20 mIU/mL.   CBC & Diff and Retic     Status: Abnormal   Collection Time: 09/29/15  4:00 PM  Result Value Ref Range   WBC 11.5 (H) 3.9 - 10.3 10e3/uL   NEUT# 8.4 (H) 1.5 - 6.5 10e3/uL   HGB 10.1 (L) 11.6 - 15.9 g/dL   HCT 33.5 (L) 34.8 - 46.6 %   Platelets 380 145 - 400 10e3/uL   MCV 69.6 (L) 79.5 - 101.0 fL   MCH 21.0 (L) 25.1 - 34.0 pg   MCHC  30.1 (L) 31.5 - 36.0 g/dL   RBC 4.81 3.70 - 5.45 10e6/uL   RDW 17.2 (H) 11.2 - 14.5 %   lymph# 2.2 0.9 - 3.3 10e3/uL   MONO# 0.5 0.1 - 0.9 10e3/uL   Eosinophils Absolute 0.3 0.0 - 0.5 10e3/uL   Basophils Absolute 0.0 0.0 - 0.1 10e3/uL   NEUT% 73.6 38.4 - 76.8 %   LYMPH% 19.3 14.0 - 49.7 %   MONO% 4.2 0.0 - 14.0 %   EOS% 2.7 0.0 - 7.0 %   BASO% 0.2 0.0 - 2.0 %   Retic % 1.18 0.70 - 2.10 %   Retic Ct Abs 56.76 33.70 - 90.70 10e3/uL   Immature Retic Fract 12.20 (H) 1.60 - 10.00 %  Ferritin     Status: None   Collection Time: 09/29/15  4:00 PM  Result Value Ref Range   Ferritin 141 9 - 269 ng/ml       Musculoskeletal: Strength & Muscle Tone: within normal limits Gait & Station: normal Patient leans: N/A  Mental Status Examination;   Psychiatric Specialty  Exam: Physical Exam  Constitutional: She appears well-nourished.  Skin: She is not diaphoretic.    Review of Systems  Constitutional: Positive for malaise/fatigue. Negative for fever.  Cardiovascular: Negative for chest pain and palpitations.  Gastrointestinal: Negative for nausea.  Skin: Negative for rash.  Psychiatric/Behavioral: Negative for suicidal ideas and substance abuse. The patient is nervous/anxious.     There were no vitals taken for this visit.There is no weight on file to calculate BMI.  General Appearance: Casual  Eye Contact::  Fair  Speech:  Slow  Volume:  Normal  Mood:  Episodic dysphoric.  Affect:  Congruent  Thought Process:  Coherent  Orientation:  Full (Time, Place, and Person)  Thought Content:  Rumination  Suicidal Thoughts:  No  Homicidal Thoughts:  No  Memory:  Immediate;   Fair Recent;   Fair  Judgement:  Fair  Insight:  Fair  Psychomotor Activity:  Normal  Concentration:  Fair  Recall:  Fair  Akathisia:  Negative  Handed:  Right  AIMS (if indicated):     Assets:  Communication Skills Desire for Improvement Financial Resources/Insurance Housing  Sleep:        Assessment: Axis I: Major depressive disorder recurrent moderate. Panic disorder. Mood disorder otherwise nonspecified. Possible related  secondary to general medical condition including chronic iback pain  Axis II: Deferred   Axis III:  Past Medical History  Diagnosis Date  . Allergy     seasonal  . Anemia     goes to [facility], ferahem  . Asthma   . Spinal stenosis   . Peripheral neuropathy ([place])   . Migraines   . Hypertension   . OSA (obstructive sleep apnea)   . Depression   . Anxiety   . Obesity   . OSA on CPAP   . Neuropathy ([place])   . Shortness of breath dyspnea   . GERD (gastroesophageal reflux disease)   . Fibromyalgia   . Arthritis   . Cancer [facility])     Cervical cancer  . Food poisoning   . 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   Treatment Plan and Summary:  Depression: continue prozac and also for anxiety and possible PTSD. Refills sent. Mood disorder; related to fibromyalgia, back condition and others. Continue follow up and compliance Panic disorder; klonopine continue but avoid increaing  dose  Abilify 2 mg for mood and night terrors and mood symptoms. . Refills written. She is also on Topamax and Lyrica which may also be helping the mood symptoms. No involuntary movements.  Does not want to change or increase doses.  Refer to sleep provider in regard to insomnia as her case is complicated with OSA. She also takes klonopine. Reviewed sleep hygiene Pertinent Labs and Relevant Prior Notes reviewed. Medication Side effects, benefits and risks reviewed/discussed with Patient. Time given for patient to respond and asks questions regarding the Diagnosis and Medications. Safety concerns and to report to ER if suicidal or call 911.  Discussed weight maintenance.  Discussed to avail opportunity to consider or/and continue Individual therapy with Counselor. Greater than 50% of time was spend in counseling and coordination of care with the patient.  Schedule for Follow up visit in 2 to 3  month or call in earlier as necessary. Time spent: 25 minutes  Merian Capron, MD 10/14/2015

## 2015-10-22 ENCOUNTER — Inpatient Hospital Stay (HOSPITAL_COMMUNITY): Payer: Medicare Other

## 2015-10-22 ENCOUNTER — Inpatient Hospital Stay (HOSPITAL_COMMUNITY)
Admission: AD | Admit: 2015-10-22 | Discharge: 2015-10-23 | Disposition: A | Discharge status: Home / Self Care | Payer: Medicare Other | Source: Ambulatory Visit | Attending: Obstetrics & Gynecology | Admitting: Obstetrics & Gynecology

## 2015-10-22 ENCOUNTER — Encounter (HOSPITAL_COMMUNITY): Payer: Self-pay

## 2015-10-22 DIAGNOSIS — D509 Iron deficiency anemia, unspecified: Secondary | ICD-10-CM | POA: Diagnosis not present

## 2015-10-22 DIAGNOSIS — M1990 Unspecified osteoarthritis, unspecified site: Secondary | ICD-10-CM | POA: Insufficient documentation

## 2015-10-22 DIAGNOSIS — R102 Pelvic and perineal pain: Secondary | ICD-10-CM | POA: Diagnosis not present

## 2015-10-22 DIAGNOSIS — M797 Fibromyalgia: Secondary | ICD-10-CM | POA: Diagnosis not present

## 2015-10-22 DIAGNOSIS — B159 Hepatitis A without hepatic coma: Secondary | ICD-10-CM | POA: Insufficient documentation

## 2015-10-22 DIAGNOSIS — I10 Essential (primary) hypertension: Secondary | ICD-10-CM | POA: Diagnosis not present

## 2015-10-22 DIAGNOSIS — Z8541 Personal history of malignant neoplasm of cervix uteri: Secondary | ICD-10-CM | POA: Diagnosis not present

## 2015-10-22 DIAGNOSIS — R1030 Lower abdominal pain, unspecified: Secondary | ICD-10-CM | POA: Diagnosis present

## 2015-10-22 DIAGNOSIS — G4733 Obstructive sleep apnea (adult) (pediatric): Secondary | ICD-10-CM | POA: Diagnosis not present

## 2015-10-22 DIAGNOSIS — K219 Gastro-esophageal reflux disease without esophagitis: Secondary | ICD-10-CM | POA: Diagnosis not present

## 2015-10-22 DIAGNOSIS — E669 Obesity, unspecified: Secondary | ICD-10-CM | POA: Insufficient documentation

## 2015-10-22 DIAGNOSIS — K760 Fatty (change of) liver, not elsewhere classified: Secondary | ICD-10-CM | POA: Insufficient documentation

## 2015-10-22 DIAGNOSIS — B9689 Other specified bacterial agents as the cause of diseases classified elsewhere: Secondary | ICD-10-CM

## 2015-10-22 DIAGNOSIS — J45909 Unspecified asthma, uncomplicated: Secondary | ICD-10-CM | POA: Insufficient documentation

## 2015-10-22 DIAGNOSIS — F418 Other specified anxiety disorders: Secondary | ICD-10-CM | POA: Insufficient documentation

## 2015-10-22 DIAGNOSIS — N760 Acute vaginitis: Secondary | ICD-10-CM | POA: Diagnosis not present

## 2015-10-22 HISTORY — DX: Fatty (change of) liver, not elsewhere classified: K76.0

## 2015-10-22 LAB — URINALYSIS, ROUTINE W REFLEX MICROSCOPIC
BILIRUBIN URINE: NEGATIVE
GLUCOSE, UA: NEGATIVE mg/dL
Hgb urine dipstick: NEGATIVE
KETONES UR: NEGATIVE mg/dL
Leukocytes, UA: NEGATIVE
Nitrite: NEGATIVE
PH: 6 (ref 5.0–8.0)
Protein, ur: NEGATIVE mg/dL
Specific Gravity, Urine: 1.015 (ref 1.005–1.030)

## 2015-10-22 LAB — BASIC METABOLIC PANEL
Anion gap: 7 (ref 5–15)
BUN: 14 mg/dL (ref 6–20)
CALCIUM: 9.2 mg/dL (ref 8.9–10.3)
CHLORIDE: 110 mmol/L (ref 101–111)
CO2: 22 mmol/L (ref 22–32)
CREATININE: 0.7 mg/dL (ref 0.44–1.00)
GFR calc Af Amer: >60 mL/min (ref >60)
GFR calc non Af Amer: >60 mL/min (ref >60)
GLUCOSE: 137 mg/dL — AB (ref 65–99)
Potassium: 3.2 mmol/L — ABNORMAL LOW (ref 3.5–5.1)
Sodium: 139 mmol/L (ref 135–145)

## 2015-10-22 LAB — POCT PREGNANCY, URINE: Preg Test, Ur: NEGATIVE

## 2015-10-22 MED ORDER — KETOROLAC TROMETHAMINE 30 MG/ML IJ SOLN
15.0000 mg | Freq: Once | INTRAMUSCULAR | Status: AC
Start: 2015-10-22 — End: 2015-10-22
  Administered 2015-10-22: 15 mg via INTRAMUSCULAR
  Filled 2015-10-22: qty 1

## 2015-10-22 NOTE — MAU Note (Addendum)
Having pain lower abd from lower abd to naval since this afternoon.. Urine cloudy few days. Hard to stand or sit. Call [facility] and have appt for Tues. Tried ibuprofen and oxycodone and lyrica and not helping. HadD&C April 9th to remove fibroids.

## 2015-10-23 DIAGNOSIS — R102 Pelvic and perineal pain: Secondary | ICD-10-CM

## 2015-10-23 DIAGNOSIS — B9689 Other specified bacterial agents as the cause of diseases classified elsewhere: Secondary | ICD-10-CM

## 2015-10-23 DIAGNOSIS — N760 Acute vaginitis: Secondary | ICD-10-CM

## 2015-10-23 LAB — WET PREP, GENITAL
SPERM: NONE SEEN
Trich, Wet Prep: NONE SEEN
YEAST WET PREP: NONE SEEN

## 2015-10-23 MED ORDER — KETOROLAC TROMETHAMINE 10 MG PO TABS: 10.0000 mg | ORAL_TABLET | Freq: Four times a day (QID) | ORAL | Status: DC | PRN

## 2015-10-23 MED ORDER — METRONIDAZOLE 500 MG PO TABS: 500.0000 mg | ORAL_TABLET | Freq: Two times a day (BID) | ORAL | Status: DC

## 2015-10-23 MED ORDER — KETOROLAC TROMETHAMINE 30 MG/ML IJ SOLN
15.0000 mg | Freq: Once | INTRAMUSCULAR | Status: AC
  Administered 2015-10-23: 15 mg via INTRAMUSCULAR
  Filled 2015-10-23: qty 1

## 2015-10-23 NOTE — MAU Provider Note (Signed)
History  Cheyenne Arias presents unannounced to MAU w/ c/o having pain from lower abdomen to naval since this afternoon. Urine cloudy few days. Hard to stand or sit. Tried Ibuprofen, Oxycodone and Lyrica w/o results. Reports pain on urination and vaginal smell. Pain too unbearable to wait for office appointment next week.   Patient Active Problem List   Diagnosis Date Noted  . BV (bacterial vaginosis) 10/23/2015  . Pelvic pain in female 10/23/2015  . CAP (community acquired pneumonia) 07/25/2015  . Asthma, chronic 07/02/2015  . OSA (obstructive sleep apnea) 07/02/2015  . Hypokalemia 07/02/2015  . Sepsis due to pneumonia ([place]) 07/02/2015  . Leukocytosis 07/02/2015  . IDA (iron deficiency anemia) 07/02/2015  . Benign essential HTN 07/12/2011  . Fibromyalgia 07/26/2009    Chief Complaint  Patient presents with  . Abdominal Pain  . Dysuria  . Difficulty Walking   HPI  As above  OB History    Gravida Para Term Preterm AB TAB SAB Ectopic Multiple Living   _0 Past Medical History  Diagnosis Date  . Allergy     seasonal  . Anemia     goes to [facility], ferahem  . Asthma   . Spinal stenosis   . Peripheral neuropathy ([place])   . Migraines   . Hypertension   . OSA (obstructive sleep apnea)   . Depression   . Anxiety   . Obesity   . OSA on CPAP   . Neuropathy ([place])   . Shortness of breath dyspnea   . GERD (gastroesophageal reflux disease)   . Fibromyalgia   . Arthritis   . Cancer [facility])     Cervical cancer  . Food poisoning   . Hepatitis     type A from food poisoning  . Fatty liver disease, nonalcoholic   . Fibromyalgia     Past Surgical History  Procedure Laterality Date  . Wisdom tooth extraction    . Mandible surgery  2008  . Carpal tunnel release    . Cesarean section      1994, 1996   . Cesarean section  11/28/2011    Procedure: CESAREAN SECTION;  Surgeon: Woodroe Mode, MD;  Location: [place] [facility];  Service: Obstetrics;  Laterality: N/A;  .  Colposcopy    . Hysteroscopy w/d&c N/A 08/17/2015    Procedure: DILATATION AND CURETTAGE /HYSTEROSCOPY;  Surgeon: Ena Dawley, MD;  Location: [place] [facility];  Service: Gynecology;  Laterality: N/A;    Family History  Problem Relation Age of Onset  . Sickle cell trait Maternal Aunt   . Sickle cell anemia Other   . Schizophrenia Son   . Depression Daughter   . Hypertension Mother     Social History  Substance Use Topics  . Smoking status: Never Smoker   . Smokeless tobacco: Never Used     Comment: Pt was exposed to 2nd hand smoke at work years ago.  . Alcohol Use: No    Allergies:  Allergies  Allergen Reactions  . Cymbalta [Duloxetine Hcl] Other (See Comments)    Head felt cold and tight, loss of conciousness  . Latex Itching, Swelling and Other (See Comments)    Reaction:  Blisters   . Morphine And Related Itching    No prescriptions prior to admission    ROS  As per HPI Physical Exam   Blood pressure 156/94, pulse 110, temperature 98.7 F (37.1 C), temperature source Oral, resp.  rate 24, height _0  (1.575 m), weight 135.535 kg (298 lb 12.8 oz), last menstrual period 09/26/2015, SpO2 99 %. Recent Results (from the past 2160 hour(s))  CBC     Status: Abnormal   Collection Time: 08/11/15  9:50 AM  Result Value Ref Range   WBC 10.7 (H) 4.0 - 10.5 K/uL   RBC 4.75 3.87 - 5.11 MIL/uL   Hemoglobin 10.4 (L) 12.0 - 15.0 g/dL   HCT 34.1 (L) 36.0 - 46.0 %   MCV 71.8 (L) 78.0 - 100.0 fL   MCH 21.9 (L) 26.0 - 34.0 pg   MCHC 30.5 30.0 - 36.0 g/dL   RDW 17.5 (H) 11.5 - 15.5 %   Platelets 343 150 - 400 K/uL  Basic metabolic panel     Status: Abnormal   Collection Time: 08/11/15  9:50 AM  Result Value Ref Range   Sodium 140 135 - 145 mmol/L   Potassium 3.1 (L) 3.5 - 5.1 mmol/L   Chloride 111 101 - 111 mmol/L   CO2 24 22 - 32 mmol/L   Glucose, Bld 103 (H) 65 - 99 mg/dL   BUN 10 6 - 20 mg/dL   Creatinine, Ser 0.71 0.44 - 1.00 mg/dL   Calcium 8.9 8.9 - 10.3 mg/dL   GFR calc  non Af Amer >60 >60 mL/min   GFR calc Af Amer >60 >60 mL/min    Comment: (NOTE) The eGFR has been calculated using the CKD EPI equation. This calculation has not been validated in all clinical situations. eGFR's persistently <60 mL/min signify possible Chronic Kidney Disease.    Anion gap 5 5 - 15  Pregnancy, urine     Status: None   Collection Time: 08/17/15 11:00 AM  Result Value Ref Range   Preg Test, Ur NEGATIVE NEGATIVE    Comment:        THE SENSITIVITY OF THIS METHODOLOGY IS >20 mIU/mL.   CBC & Diff and Retic     Status: Abnormal   Collection Time: 09/29/15  4:00 PM  Result Value Ref Range   WBC 11.5 (H) 3.9 - 10.3 10e3/uL   NEUT# 8.4 (H) 1.5 - 6.5 10e3/uL   HGB 10.1 (L) 11.6 - 15.9 g/dL   HCT 33.5 (L) 34.8 - 46.6 %   Platelets 380 145 - 400 10e3/uL   MCV 69.6 (L) 79.5 - 101.0 fL   MCH 21.0 (L) 25.1 - 34.0 pg   MCHC 30.1 (L) 31.5 - 36.0 g/dL   RBC 4.81 3.70 - 5.45 10e6/uL   RDW 17.2 (H) 11.2 - 14.5 %   lymph# 2.2 0.9 - 3.3 10e3/uL   MONO# 0.5 0.1 - 0.9 10e3/uL   Eosinophils Absolute 0.3 0.0 - 0.5 10e3/uL   Basophils Absolute 0.0 0.0 - 0.1 10e3/uL   NEUT% 73.6 38.4 - 76.8 %   LYMPH% 19.3 14.0 - 49.7 %   MONO% 4.2 0.0 - 14.0 %   EOS% 2.7 0.0 - 7.0 %   BASO% 0.2 0.0 - 2.0 %   Retic % 1.18 0.70 - 2.10 %   Retic Ct Abs 56.76 33.70 - 90.70 10e3/uL   Immature Retic Fract 12.20 (H) 1.60 - 10.00 %  Ferritin     Status: None   Collection Time: 09/29/15  4:00 PM  Result Value Ref Range   Ferritin 141 9 - 269 ng/ml  Urinalysis, Routine w reflex microscopic (not at [facility])     Status: None   Collection Time: 10/22/15  7:40 PM  Result  Value Ref Range   Color, Urine YELLOW YELLOW   APPearance CLEAR CLEAR   Specific Gravity, Urine 1.015 1.005 - 1.030   pH 6.0 5.0 - 8.0   Glucose, UA NEGATIVE NEGATIVE mg/dL   Hgb urine dipstick NEGATIVE NEGATIVE   Bilirubin Urine NEGATIVE NEGATIVE   Ketones, ur NEGATIVE NEGATIVE mg/dL   Protein, ur NEGATIVE NEGATIVE mg/dL   Nitrite  NEGATIVE NEGATIVE   Leukocytes, UA NEGATIVE NEGATIVE    Comment: MICROSCOPIC NOT DONE ON URINES WITH NEGATIVE PROTEIN, BLOOD, LEUKOCYTES, NITRITE, OR GLUCOSE <1000 mg/dL.  Pregnancy, urine POC     Status: None   Collection Time: 10/22/15  7:50 PM  Result Value Ref Range   Preg Test, Ur NEGATIVE NEGATIVE    Comment:        THE SENSITIVITY OF THIS METHODOLOGY IS >24 mIU/mL   Basic metabolic panel     Status: Abnormal   Collection Time: 10/22/15  9:39 PM  Result Value Ref Range   Sodium 139 135 - 145 mmol/L   Potassium 3.2 (L) 3.5 - 5.1 mmol/L   Chloride 110 101 - 111 mmol/L   CO2 22 22 - 32 mmol/L   Glucose, Bld 137 (H) 65 - 99 mg/dL   BUN 14 6 - 20 mg/dL   Creatinine, Ser 0.70 0.44 - 1.00 mg/dL   Calcium 9.2 8.9 - 10.3 mg/dL   GFR calc non Af Amer >60 >60 mL/min   GFR calc Af Amer >60 >60 mL/min    Comment: (NOTE) The eGFR has been calculated using the CKD EPI equation. This calculation has not been validated in all clinical situations. eGFR's persistently <60 mL/min signify possible Chronic Kidney Disease.    Anion gap 7 5 - 15  GC/Chlamydia probe amp ([place])not at [facility]     Status: None   Collection Time: 10/23/15 12:00 AM  Result Value Ref Range   Chlamydia Negative     Comment: Normal Reference Range - Negative   Neisseria gonorrhea Negative     Comment: Normal Reference Range - Negative  Wet prep, genital     Status: Abnormal   Collection Time: 10/23/15 12:20 AM  Result Value Ref Range   Yeast Wet Prep HPF POC NONE SEEN NONE SEEN   Trich, Wet Prep NONE SEEN NONE SEEN   Clue Cells Wet Prep HPF POC PRESENT (A) NONE SEEN   WBC, Wet Prep HPF POC FEW (A) NONE SEEN    Comment: MANY BACTERIA SEEN   Sperm NONE SEEN   CLINICAL DATA: 40 year old female with history of pelvic pain.  EXAM: TRANSABDOMINAL AND TRANSVAGINAL ULTRASOUND OF PELVIS  TECHNIQUE: Both transabdominal and transvaginal ultrasound examinations of the pelvis were performed. Transabdominal  technique was performed for global imaging of the pelvis including uterus, ovaries, adnexal regions, and pelvic cul-de-sac. It was necessary to proceed with endovaginal exam following the transabdominal exam to visualize the endometrium and adnexal regions.  COMPARISON: No priors.  FINDINGS: Uterus  Measurements: 9.7 x 5.0 x 6.1 cm. No fibroids or other mass visualized.  Endometrium  Thickness: 8.3 mm. No focal abnormality visualized.  Right ovary  Measurements: 4.3 x 5.2 x 4.2 cm. 3.3 x 3.3 x 2.5 cm lesion with heterogeneous internal echotexture, predominantly anechoic with increased through transmission, but with multiple low-level internal echoes and some internal septations.  Left ovary  Measurements: 3.5 x 1.7 x 1.8 cm. Normal appearance/no adnexal mass.  Other findings  No abnormal free fluid.  IMPRESSION: 1. No acute findings in the  pelvis. 2. Indeterminate lesion in left ovary, poorly evaluated secondary to the patient's body habitus, but favored to represent a hemorrhagic cyst. Repeat evaluation with pelvic ultrasound is recommended in 6-12 weeks to ensure the stability or resolution of this finding.   Electronically Signed  By: Vinnie Langton M.D.  On: 10/22/2015 22:38   Physical Exam  Gen: NAD. Lungs: CTAB. CV: RRR w/o M/R/G. Back: Neg CVAT bilaterally. Abdomen: soft, mild tenderness to left lower quadrant, none to right. No rebound tenderness or guarding. External genitalia: No external erythema, edema or excoriation. Vagina: No lesions. No blood in the vault. SSE: copious amount of white, thick discharge -- whiff test positive. Specimen collected for wet prep and GC/CT collection -- no active bleeding.  Pelvic: c/w 8+ wk size, non tender, no CMT. L adnexal tenderness, none on right. No masses.  Ext: WNL.    ED Course  Assessment: Pelvic pain. See u/s results above -- ? Hemorrhagic cyst on left ovary. Pain relieved w/  Toradol 30 mg IM. BV.   Plan: Keep f/u appt w/ Dr. Raphael Gibney on Tuesday, 10/25/15 for f/u from this visit. Per Radiologist, will need repeat u/s in 6-12 wks. Metronidazole 500 mg po bid x 7 days. Toradol 10 mg po tabs.  Farrel Gordon CNM, MS 10/23/15, 01:19 AM

## 2015-10-23 NOTE — Discharge Instructions (Signed)
Pelvic Pain, Female Pelvic pain is pain felt below the belly button and between your hips. It can be caused by many different things. It is important to get help right away. This is especially true for severe, sharp, or unusual pain that comes on suddenly.  HOME CARE  Only take medicine as told by your doctor.  Rest as told by your doctor.  Eat a healthy diet, such as fruits, vegetables, and lean meats.  Drink enough fluids to keep your pee (urine) clear or pale yellow, or as told.  Avoid sex (intercourse) if it causes pain.  Apply warm or cold packs to your lower belly (abdomen). Use the type of pack that helps the pain.  Avoid situations that cause you stress.  Keep a journal to track your pain. Write down:  When the pain started.  Where it is located.  If there are things that seem to be related to the pain, such as food or your period.  Follow up with your doctor as told. GET HELP RIGHT AWAY IF:   You have heavy bleeding from the vagina.  You have more pelvic pain.  You feel lightheaded or pass out (faint).  You have chills.  You have pain when you pee or have blood in your pee.  You cannot stop having watery poop (diarrhea).  You cannot stop throwing up (vomiting).  You have a fever or lasting symptoms for more than 3 days.  You have a fever and your symptoms suddenly get worse.  You are being physically or sexually abused.  Your medicine does not help your pain.  You have fluid (discharge) coming from your vagina that is not normal. MAKE SURE YOU:  Understand these instructions.  Will watch your condition.  Will get help if you are not doing well or get worse.   This information is not intended to replace advice given to you by your health care provider. Make sure you discuss any questions you have with your health care provider.   Document Released: 09/19/2007 Document Revised: 04/23/2014 Document Reviewed: 07/23/2011 Elsevier Interactive Patient  Education 2016 Elsevier Inc. Bacterial Vaginosis Bacterial vaginosis is an infection of the vagina. It happens when too many germs (bacteria) grow in the vagina. Having this infection puts you at risk for getting other infections from sex. Treating this infection can help lower your risk for other infections, such as:   Chlamydia.  Gonorrhea.  HIV.  Herpes. HOME CARE  Take your medicine as told by your doctor.  Finish your medicine even if you start to feel better.  Tell your sex partner that you have an infection. They should see their doctor for treatment.  During treatment:  Avoid sex or use condoms correctly.  Do not douche.  Do not drink alcohol unless your doctor tells you it is ok.  Do not breastfeed unless your doctor tells you it is ok. GET HELP IF:  You are not getting better after 3 days of treatment.  You have more grey fluid (discharge) coming from your vagina than before.  You have more pain than before.  You have a fever. MAKE SURE YOU:   Understand these instructions.  Will watch your condition.  Will get help right away if you are not doing well or get worse.   This information is not intended to replace advice given to you by your health care provider. Make sure you discuss any questions you have with your health care provider.   Document Released: 01/10/2008  Document Revised: 04/23/2014 Document Reviewed: 11/12/2012 Elsevier Interactive Patient Education [employer].

## 2015-10-24 LAB — GC/CHLAMYDIA PROBE AMP (~~LOC~~) NOT AT ARMC
Chlamydia: NEGATIVE
Neisseria Gonorrhea: NEGATIVE

## 2015-11-16 ENCOUNTER — Other Ambulatory Visit: Payer: Self-pay | Admitting: Adult Health

## 2015-11-16 DIAGNOSIS — D259 Leiomyoma of uterus, unspecified: Secondary | ICD-10-CM

## 2015-11-16 DIAGNOSIS — N390 Urinary tract infection, site not specified: Secondary | ICD-10-CM

## 2015-11-16 DIAGNOSIS — R110 Nausea: Secondary | ICD-10-CM

## 2015-12-12 ENCOUNTER — Emergency Department (HOSPITAL_COMMUNITY)
Admission: EM | Admit: 2015-12-12 | Discharge: 2015-12-12 | Disposition: A | Discharge status: Home / Self Care | Payer: Medicare Other | Attending: Emergency Medicine | Admitting: Emergency Medicine

## 2015-12-12 ENCOUNTER — Encounter (HOSPITAL_COMMUNITY): Payer: Self-pay | Admitting: Emergency Medicine

## 2015-12-12 DIAGNOSIS — Z8541 Personal history of malignant neoplasm of cervix uteri: Secondary | ICD-10-CM | POA: Insufficient documentation

## 2015-12-12 DIAGNOSIS — J45909 Unspecified asthma, uncomplicated: Secondary | ICD-10-CM | POA: Diagnosis not present

## 2015-12-12 DIAGNOSIS — Z91040 Latex allergy status: Secondary | ICD-10-CM | POA: Diagnosis not present

## 2015-12-12 DIAGNOSIS — M79605 Pain in left leg: Secondary | ICD-10-CM | POA: Diagnosis present

## 2015-12-12 DIAGNOSIS — M5417 Radiculopathy, lumbosacral region: Secondary | ICD-10-CM | POA: Diagnosis not present

## 2015-12-12 DIAGNOSIS — Z79899 Other long term (current) drug therapy: Secondary | ICD-10-CM | POA: Diagnosis not present

## 2015-12-12 DIAGNOSIS — I10 Essential (primary) hypertension: Secondary | ICD-10-CM | POA: Insufficient documentation

## 2015-12-12 MED ORDER — HYDROMORPHONE HCL 1 MG/ML IJ SOLN
1.0000 mg | Freq: Once | INTRAMUSCULAR | Status: AC
  Administered 2015-12-12: 1 mg via INTRAMUSCULAR
  Filled 2015-12-12: qty 1

## 2015-12-12 NOTE — ED Triage Notes (Signed)
Pt c/o L leg pain x 3 days, denies trauma, worse with palpation to upper thigh. Pt states she is not able to control pain with per normal pain medication regiment.

## 2015-12-12 NOTE — ED Provider Notes (Signed)
[facility] DEPT Provider Note   CSN: [id number] Arrival date & time: 12/12/15  [id number]     History   Chief Complaint Chief Complaint  Patient presents with  . Leg Pain    HPI Cheyenne Arias is a 40 y.o. female.  HPI Cheyenne Arias is a 39 y.o. female with morbid obesity, anemia, fibromyalgia, chronic pain followed by pain management, presents to emergency department complaining of left leg pain. Patient reports intermittent shooting pains from the lower back and to the left thigh and left knee. She states pain comes and goes depending on activity. Patient states she has to limp when she walks. Pain with range of motion of the knee and hip. She has been taking her regular pain medications which includes oxycodone 15 mg every 4-6 hours, ibuprofen, Flexeril, states she has had no relief in this pain. She reports history of spinal stenosis and prior radicular pain down her thigh that was more severe. She states she spent one year in a wheelchair and had to do extensive physical therapy to regain her ability to ambulate. She denies any trouble controlling bladder or bowels. She denies any numbness or weakness in her leg. She denies any injuries. She states that she has done a lot of physical activity recently including moving into a different place, states she lifted heavy furniture.  Past Medical History:  Diagnosis Date  . Allergy    seasonal  . Anemia    goes to [facility], ferahem  . Anxiety   . Arthritis   . Asthma   . Cancer [facility])    Cervical cancer  . Depression   . Fatty liver disease, nonalcoholic   . Fibromyalgia   . Fibromyalgia   . Food poisoning   . GERD (gastroesophageal reflux disease)   . 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 Hypertension   . Migraines   . Neuropathy ([place])   . Obesity   . OSA (obstructive sleep apnea)   . OSA on CPAP   . Peripheral neuropathy ([place])   . Shortness of breath dyspnea   . Spinal stenosis     Patient Active Problem List     Diagnosis Date Noted  . BV (bacterial vaginosis) 10/23/2015  . Pelvic pain in female 10/23/2015  . CAP (community acquired pneumonia) 07/25/2015  . Asthma, chronic 07/02/2015  . OSA (obstructive sleep apnea) 07/02/2015  . Hypokalemia 07/02/2015  . Sepsis due to pneumonia ([place]) 07/02/2015  . Leukocytosis 07/02/2015  . IDA (iron deficiency anemia) 07/02/2015  . Benign essential HTN 07/12/2011  . Fibromyalgia 07/26/2009    Past Surgical History:  Procedure Laterality Date  . CARPAL TUNNEL RELEASE    . [place]   . CESAREAN SECTION  11/28/2011   Procedure: CESAREAN SECTION;  Surgeon: Woodroe Mode, MD;  Location: [place] [facility];  Service: Obstetrics;  Laterality: N/A;  . COLPOSCOPY    . HYSTEROSCOPY W/D&C N/A 08/17/2015   Procedure: DILATATION AND CURETTAGE /HYSTEROSCOPY;  Surgeon: Ena Dawley, MD;  Location: [facility];  Service: Gynecology;  Laterality: N/A;  . MANDIBLE SURGERY  2008  . WISDOM TOOTH EXTRACTION      OB History    Gravida Para Term Preterm AB Living   3 3 3     3    SAB TAB Ectopic Multiple Live Births           3       Home Medications  Prior to Admission medications   Medication Sig Start Date End Date Taking? Authorizing Provider  amLODipine (NORVASC) 10 MG tablet Take 10 mg by mouth daily.    Historical Provider, MD  ARIPiprazole (ABILIFY) 2 MG tablet Take 1 tablet (2 mg total) by mouth daily. 10/14/15   Merian Capron, MD  budesonide (PULMICORT) 0.5 MG/2ML nebulizer solution Take 2 mLs (0.5 mg total) by nebulization 2 (two) times daily. Dx Code: Chronic Asthma J45.909 08/11/15   Tammy S Parrett, NP  clonazePAM (KLONOPIN) 0.5 MG tablet Take 1 tablet (0.5 mg total) by mouth 2 (two) times daily as needed for anxiety. 10/14/15   Merian Capron, MD  cyclobenzaprine (FLEXERIL) 10 MG tablet Take 10 mg by mouth 3 (three) times daily as needed for muscle spasms.     Historical Provider, MD  ferumoxytol Shirlean Kelly) 510 MG/17ML SOLN injection Inject 510  mg into the vein every 3 (three) months.    Historical Provider, MD  FLUoxetine (PROZAC) 20 MG capsule Take 1 capsule (20 mg total) by mouth daily. 10/14/15   Merian Capron, MD  fluticasone (FLONASE) 50 MCG/ACT nasal spray Place 2 sprays into both nostrils 2 (two) times daily.    Historical Provider, MD  furosemide (LASIX) 80 MG tablet Take 80 mg by mouth 2 (two) times daily.    Historical Provider, MD  HYDROmorphone (DILAUDID) 2 MG tablet Take 1 tablet (2 mg total) by mouth every 4 (four) hours as needed for severe pain. 08/17/15   Ena Dawley, MD  ipratropium-albuterol (DUONEB) 0.5-2.5 (3) MG/3ML SOLN Take 3 mLs by nebulization every 4 (four) hours as needed (for wheezing/shortness of breath). 06/17/15   Tammy S Parrett, NP  ketorolac (TORADOL) 10 MG tablet Take 1 tablet (10 mg total) by mouth every 6 (six) hours as needed. 10/23/15   Farrel Gordon, CNM  lactulose (CHRONULAC) 10 GM/15ML solution Take 10 g by mouth 3 (three) times daily as needed for mild constipation.    Historical Provider, MD  loratadine (CLARITIN) 10 MG tablet TAKE 1 TABLET BY MOUTH AT BEDTIME 11/16/15   Tammy S Parrett, NP  metoprolol tartrate (LOPRESSOR) 25 MG tablet Take 25 mg by mouth every morning.     Historical Provider, MD  metroNIDAZOLE (FLAGYL) 500 MG tablet Take 1 tablet (500 mg total) by mouth 2 (two) times daily. 10/23/15   Farrel Gordon, CNM  montelukast (SINGULAIR) 10 MG tablet Take 1 tablet (10 mg total) by mouth at bedtime. 06/01/15   Tammy S Parrett, NP  Multiple Vitamins-Minerals (MULTIVITAMIN WITH MINERALS) tablet Take 1 tablet by mouth daily.    Historical Provider, MD  ondansetron (ZOFRAN) 8 MG tablet Take 1 tablet (8 mg total) by mouth every 8 (eight) hours as needed for nausea or vomiting. 03/19/15   Lori A Clemmons, CNM  oxyCODONE (ROXICODONE) 15 MG immediate release tablet Take 15 mg by mouth every 6 (six) hours. 06/09/15   Historical Provider, MD  pantoprazole (PROTONIX) 40 MG tablet TAKE 1 TABLET (40 MG  TOTAL) BY MOUTH DAILY. 11/16/15   Tammy S Parrett, NP  potassium chloride SA (K-DUR,KLOR-CON) 20 MEQ tablet Take 20 mEq by mouth 2 (two) times daily.    Historical Provider, MD  pregabalin (LYRICA) 150 MG capsule Take 225 mg by mouth 2 (two) times daily. Takes 150 mg and 75 mg to equal 225 mg twice daily.    Historical Provider, MD  pregabalin (LYRICA) 75 MG capsule Take 225 mg by mouth 2 (two) times daily. Takes 150 mg and 75 mg  to equal 225 mg twice daily.    Historical Provider, MD  PROAIR HFA 108 (90 Base) MCG/ACT inhaler INHALE 2 PUFFS INTO THE LUNGS EVERY 6 (SIX) HOURS AS NEEDED FOR WHEEZING OR SHORTNESS OF BREATH. 08/05/15   Chesley Mires, MD  promethazine (PHENERGAN) 25 MG tablet Take 25 mg by mouth every 6 (six) hours as needed for nausea or vomiting.    Historical Provider, MD  rizatriptan (MAXALT) 10 MG tablet Take 10 mg by mouth as needed for migraine. May repeat in 2 hours if needed    Historical Provider, MD  topiramate (TOPAMAX) 100 MG tablet Take 100 mg by mouth 2 (two) times daily.    Historical Provider, MD    Family History Family History  Problem Relation Age of Onset  . Hypertension Mother   . Sickle cell trait Maternal Aunt   . Sickle cell anemia Other   . Schizophrenia Son   . Depression Daughter     Social History Social History  Substance Use Topics  . Smoking status: Never Smoker  . Smokeless tobacco: Never Used     Comment: Pt was exposed to 2nd hand smoke at work years ago.  . Alcohol use No     Allergies   Cymbalta [duloxetine hcl]; Latex; and Morphine and related   Review of Systems Review of Systems  Constitutional: Negative for chills and fever.  Respiratory: Negative for cough, chest tightness and shortness of breath.   Cardiovascular: Negative for chest pain, palpitations and leg swelling.  Gastrointestinal: Negative for abdominal pain, diarrhea, nausea and vomiting.  Musculoskeletal: Positive for arthralgias and myalgias. Negative for neck pain  and neck stiffness.  Skin: Negative for rash.  Neurological: Negative for dizziness, weakness, numbness and headaches.  All other systems reviewed and are negative.    Physical Exam Updated Vital Signs BP 156/94 (BP Location: Right Arm)   Pulse 102   Temp 98.9 F (37.2 C) (Oral)   Resp 22   Ht 5\' 2"  (1.575 m)   Wt 136.1 kg   LMP 11/24/2015   SpO2 97%   BMI 54.87 kg/m   Physical Exam  Constitutional: She appears well-developed and well-nourished. No distress.  Morbidly obese  HENT:  Head: Normocephalic.  Eyes: Conjunctivae are normal.  Neck: Neck supple.  Cardiovascular: Normal rate, regular rhythm and normal heart sounds.   Pulmonary/Chest: Effort normal and breath sounds normal. No respiratory distress. She has no wheezes. She has no rales.  Musculoskeletal: She exhibits no edema.  Mild soft tissue tenderness over diffuse anterior left thigh, left anterior knee. Patient has bilateral 2+ lower extremity pitting edema. Dorsal pedal pulses are intact and equal bilaterally. Pain with range of motion of the left knee. Negative anterior-posterior drawer signs. No pain or laxity with medial lateral stress. Full range of motion of the left hip with pain with flexion and internal rotation. Tenderness to palpation over left SI joint  Neurological: She is alert.  Skin: Skin is warm and dry.  Psychiatric: She has a normal mood and affect. Her behavior is normal.  Nursing note and vitals reviewed.    ED Treatments / Results  Labs (all labs ordered are listed, but only abnormal results are displayed) Labs Reviewed - No data to display  EKG  EKG Interpretation None       Radiology No results found.  Procedures Procedures (including critical care time)  Medications Ordered in ED Medications  HYDROmorphone (DILAUDID) injection 1 mg (1 mg Intramuscular Given 12/12/15 0416)  Initial Impression / Assessment and Plan / ED Course  I have reviewed the triage vital signs  and the nursing notes.  Pertinent labs & imaging results that were available during my care of the patient were reviewed by me and considered in my medical decision making (see chart for details).  Clinical Course   Patient with acute on chronic left leg pain. Exam is most consistent with lumbosacral radiculopathy. No evidence of cauda equina.  Discussed starting PT at home, weight loss, follow up with her doctor. Pt given dilaudid 1mg  of medication IM. She is already on medications for pain at home, followed by pain management. Will dc home with follow up.   Final Clinical Impressions(s) / ED Diagnoses   Final diagnoses:  Lumbosacral radiculopathy    New Prescriptions Discharge Medication List as of 12/12/2015  4:36 AM       Jeannett Senior, PA-C 12/12/15 0530    Lacretia Leigh, MD 12/14/15 [phone]

## 2015-12-12 NOTE — ED Notes (Signed)
Pt ambulatory and independent at discharge.  Verbalized understanding of discharge instructions.  Pt instructed not to drive under the influence of pain medications.

## 2015-12-12 NOTE — Discharge Instructions (Signed)
Started him back exercises. Try to get back into the gym. Continue your regular medications. Call your doctor tomorrow for close follow-up. Return if any trouble with bowels or bladder control. Of any numbness or weakness to extremities.

## 2015-12-17 ENCOUNTER — Emergency Department (HOSPITAL_COMMUNITY): Payer: Medicare Other

## 2015-12-17 ENCOUNTER — Emergency Department (HOSPITAL_COMMUNITY)
Admission: EM | Admit: 2015-12-17 | Discharge: 2015-12-17 | Disposition: A | Discharge status: Home / Self Care | Payer: Medicare Other | Attending: Emergency Medicine | Admitting: Emergency Medicine

## 2015-12-17 ENCOUNTER — Encounter (HOSPITAL_COMMUNITY): Payer: Self-pay | Admitting: Emergency Medicine

## 2015-12-17 DIAGNOSIS — Z79899 Other long term (current) drug therapy: Secondary | ICD-10-CM | POA: Diagnosis not present

## 2015-12-17 DIAGNOSIS — Z8541 Personal history of malignant neoplasm of cervix uteri: Secondary | ICD-10-CM | POA: Insufficient documentation

## 2015-12-17 DIAGNOSIS — Z91040 Latex allergy status: Secondary | ICD-10-CM | POA: Insufficient documentation

## 2015-12-17 DIAGNOSIS — R110 Nausea: Secondary | ICD-10-CM | POA: Insufficient documentation

## 2015-12-17 DIAGNOSIS — K760 Fatty (change of) liver, not elsewhere classified: Secondary | ICD-10-CM

## 2015-12-17 DIAGNOSIS — I10 Essential (primary) hypertension: Secondary | ICD-10-CM | POA: Diagnosis not present

## 2015-12-17 DIAGNOSIS — K625 Hemorrhage of anus and rectum: Secondary | ICD-10-CM | POA: Diagnosis present

## 2015-12-17 DIAGNOSIS — K7581 Nonalcoholic steatohepatitis (NASH): Secondary | ICD-10-CM | POA: Diagnosis not present

## 2015-12-17 DIAGNOSIS — J45909 Unspecified asthma, uncomplicated: Secondary | ICD-10-CM | POA: Diagnosis not present

## 2015-12-17 HISTORY — DX: Radiculopathy, lumbar region: M54.16

## 2015-12-17 LAB — COMPREHENSIVE METABOLIC PANEL
ALBUMIN: 3.8 g/dL (ref 3.5–5.0)
ALT: 33 U/L (ref 14–54)
ANION GAP: 6 (ref 5–15)
AST: 46 U/L — ABNORMAL HIGH (ref 15–41)
Alkaline Phosphatase: 62 U/L (ref 38–126)
BILIRUBIN TOTAL: 0.4 mg/dL (ref 0.3–1.2)
BUN: 13 mg/dL (ref 6–20)
CO2: 25 mmol/L (ref 22–32)
Calcium: 8.6 mg/dL — ABNORMAL LOW (ref 8.9–10.3)
Chloride: 109 mmol/L (ref 101–111)
Creatinine, Ser: 0.67 mg/dL (ref 0.44–1.00)
GFR calc Af Amer: >60 mL/min (ref >60)
GLUCOSE: 108 mg/dL — AB (ref 65–99)
POTASSIUM: 3.1 mmol/L — AB (ref 3.5–5.1)
Sodium: 140 mmol/L (ref 135–145)
TOTAL PROTEIN: 7.6 g/dL (ref 6.5–8.1)

## 2015-12-17 LAB — URINALYSIS, ROUTINE W REFLEX MICROSCOPIC
Bilirubin Urine: NEGATIVE
Glucose, UA: NEGATIVE mg/dL
Hgb urine dipstick: NEGATIVE
Ketones, ur: NEGATIVE mg/dL
LEUKOCYTES UA: NEGATIVE
NITRITE: NEGATIVE
PH: 7 (ref 5.0–8.0)
Protein, ur: NEGATIVE mg/dL
SPECIFIC GRAVITY, URINE: 1.018 (ref 1.005–1.030)

## 2015-12-17 LAB — I-STAT BETA HCG BLOOD, ED (MC, WL, AP ONLY): I-stat hCG, quantitative: <5 m[IU]/mL (ref <5)

## 2015-12-17 LAB — CBC
HEMATOCRIT: 31 % — AB (ref 36.0–46.0)
HEMOGLOBIN: 9 g/dL — AB (ref 12.0–15.0)
MCH: 19.8 pg — ABNORMAL LOW (ref 26.0–34.0)
MCHC: 29 g/dL — ABNORMAL LOW (ref 30.0–36.0)
MCV: 68.3 fL — ABNORMAL LOW (ref 78.0–100.0)
Platelets: 357 10*3/uL (ref 150–400)
RBC: 4.54 MIL/uL (ref 3.87–5.11)
RDW: 18.4 % — ABNORMAL HIGH (ref 11.5–15.5)
WBC: 9.7 10*3/uL (ref 4.0–10.5)

## 2015-12-17 LAB — POC OCCULT BLOOD, ED: FECAL OCCULT BLD: POSITIVE — AB

## 2015-12-17 LAB — LIPASE, BLOOD: Lipase: 47 U/L (ref 11–51)

## 2015-12-17 MED ORDER — OXYCODONE-ACETAMINOPHEN 5-325 MG PO TABS: 1.0000 | ORAL_TABLET | Freq: Once | ORAL | Status: DC

## 2015-12-17 MED ORDER — IOPAMIDOL (ISOVUE-300) INJECTION 61%
100.0000 mL | Freq: Once | INTRAVENOUS | Status: AC | PRN
  Administered 2015-12-17: 100 mL via INTRAVENOUS

## 2015-12-17 NOTE — Discharge Instructions (Signed)
Please follow up with gastroenterologist and primary care doctor. Return if worsening bleeding or pain. Low fat diet. Your CT scan is normal except for fatty infiltration in your liver.

## 2015-12-17 NOTE — ED Triage Notes (Signed)
Pt c/o intermittent upper abdominal pain with nausea. Pt denies V/D/ constipation. Pt states 4 days ago she noticed bright red blood with bowel movements.

## 2015-12-17 NOTE — ED Provider Notes (Signed)
[facility] DEPT Provider Note   CSN: [id number] Arrival date & time: 12/17/15  1052     History   Chief Complaint Chief Complaint  Patient presents with  . Abdominal Pain  . Rectal Bleeding    HPI Cheyenne Arias is a 40 y.o. female.  HPI Cheyenne Arias is a 40 y.o. female with history of anemia, fatty liver disease, fibromyalgia, chronic back pain, presents to emergency department complaining of abdominal pain and rectal bleeding. Patient states she noticed bright red blood in her stool for the last 4 days. States blood is there only with bowel movements. Denies any vaginal bleeding. Reports diffuse abdominal pain that has been increasing in the last several days. Reports loss of appetite. Reports some nausea, no vomiting. Denies being on any blood thinners. No rectal pain. No hx of rectal bleed or abdominal surgeries. Denies pregnancy. No recent rectal injuries or intercourse. Denies fever, chills, malaise.   Past Medical History:  Diagnosis Date  . Allergy    seasonal  . Anemia    goes to [facility], ferahem  . Anxiety   . Arthritis   . Asthma   . Cancer [facility])    Cervical cancer  . Depression   . Fatty liver disease, nonalcoholic   . Fibromyalgia   . Fibromyalgia   . Food poisoning   . GERD (gastroesophageal reflux disease)   . 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 Hypertension   . Lumbar radiculopathy   . Migraines   . Neuropathy ([place])   . Obesity   . OSA (obstructive sleep apnea)   . OSA on CPAP   . Peripheral neuropathy ([place])   . Shortness of breath dyspnea   . Spinal stenosis     Patient Active Problem List   Diagnosis Date Noted  . BV (bacterial vaginosis) 10/23/2015  . Pelvic pain in female 10/23/2015  . CAP (community acquired pneumonia) 07/25/2015  . Asthma, chronic 07/02/2015  . OSA (obstructive sleep apnea) 07/02/2015  . Hypokalemia 07/02/2015  . Sepsis due to pneumonia ([place]) 07/02/2015  . Leukocytosis 07/02/2015  . IDA (iron  deficiency anemia) 07/02/2015  . Benign essential HTN 07/12/2011  . Fibromyalgia 07/26/2009    Past Surgical History:  Procedure Laterality Date  . CARPAL TUNNEL RELEASE    . [place]   . CESAREAN SECTION  11/28/2011   Procedure: CESAREAN SECTION;  Surgeon: Woodroe Mode, MD;  Location: [facility];  Service: Obstetrics;  Laterality: N/A;  . COLPOSCOPY    . HYSTEROSCOPY W/D&C N/A 08/17/2015   Procedure: DILATATION AND CURETTAGE /HYSTEROSCOPY;  Surgeon: Ena Dawley, MD;  Location: [facility];  Service: Gynecology;  Laterality: N/A;  . MANDIBLE SURGERY  2008  . WISDOM TOOTH EXTRACTION      OB History    Gravida Para Term Preterm AB Living   3 3 3     3    SAB TAB Ectopic Multiple Live Births           3       Home Medications    Prior to Admission medications   Medication Sig Start Date End Date Taking? Authorizing Provider  amLODipine (NORVASC) 10 MG tablet Take 10 mg by mouth daily.    Historical Provider, MD  ARIPiprazole (ABILIFY) 2 MG tablet Take 1 tablet (2 mg total) by mouth daily. 10/14/15   Merian Capron, MD  budesonide (PULMICORT) 0.5 MG/2ML nebulizer solution Take 2 mLs (0.5 mg  total) by nebulization 2 (two) times daily. Dx Code: Chronic Asthma J45.909 08/11/15   Tammy S Parrett, NP  clonazePAM (KLONOPIN) 0.5 MG tablet Take 1 tablet (0.5 mg total) by mouth 2 (two) times daily as needed for anxiety. 10/14/15   Merian Capron, MD  cyclobenzaprine (FLEXERIL) 10 MG tablet Take 10 mg by mouth 3 (three) times daily as needed for muscle spasms.     Historical Provider, MD  ferumoxytol Shirlean Kelly) 510 MG/17ML SOLN injection Inject 510 mg into the vein every 3 (three) months.    Historical Provider, MD  FLUoxetine (PROZAC) 20 MG capsule Take 1 capsule (20 mg total) by mouth daily. 10/14/15   Merian Capron, MD  fluticasone (FLONASE) 50 MCG/ACT nasal spray Place 2 sprays into both nostrils 2 (two) times daily.    Historical Provider, MD  furosemide (LASIX) 80 MG tablet  Take 80 mg by mouth 2 (two) times daily.    Historical Provider, MD  HYDROmorphone (DILAUDID) 2 MG tablet Take 1 tablet (2 mg total) by mouth every 4 (four) hours as needed for severe pain. 08/17/15   Ena Dawley, MD  ipratropium-albuterol (DUONEB) 0.5-2.5 (3) MG/3ML SOLN Take 3 mLs by nebulization every 4 (four) hours as needed (for wheezing/shortness of breath). 06/17/15   Tammy S Parrett, NP  ketorolac (TORADOL) 10 MG tablet Take 1 tablet (10 mg total) by mouth every 6 (six) hours as needed. 10/23/15   Farrel Gordon, CNM  lactulose (CHRONULAC) 10 GM/15ML solution Take 10 g by mouth 3 (three) times daily as needed for mild constipation.    Historical Provider, MD  loratadine (CLARITIN) 10 MG tablet TAKE 1 TABLET BY MOUTH AT BEDTIME 11/16/15   Tammy S Parrett, NP  metoprolol tartrate (LOPRESSOR) 25 MG tablet Take 25 mg by mouth every morning.     Historical Provider, MD  metroNIDAZOLE (FLAGYL) 500 MG tablet Take 1 tablet (500 mg total) by mouth 2 (two) times daily. 10/23/15   Farrel Gordon, CNM  montelukast (SINGULAIR) 10 MG tablet Take 1 tablet (10 mg total) by mouth at bedtime. 06/01/15   Tammy S Parrett, NP  Multiple Vitamins-Minerals (MULTIVITAMIN WITH MINERALS) tablet Take 1 tablet by mouth daily.    Historical Provider, MD  ondansetron (ZOFRAN) 8 MG tablet Take 1 tablet (8 mg total) by mouth every 8 (eight) hours as needed for nausea or vomiting. 03/19/15   Lori A Clemmons, CNM  oxyCODONE (ROXICODONE) 15 MG immediate release tablet Take 15 mg by mouth every 6 (six) hours. 06/09/15   Historical Provider, MD  pantoprazole (PROTONIX) 40 MG tablet TAKE 1 TABLET (40 MG TOTAL) BY MOUTH DAILY. 11/16/15   Tammy S Parrett, NP  potassium chloride SA (K-DUR,KLOR-CON) 20 MEQ tablet Take 20 mEq by mouth 2 (two) times daily.    Historical Provider, MD  pregabalin (LYRICA) 150 MG capsule Take 225 mg by mouth 2 (two) times daily. Takes 150 mg and 75 mg to equal 225 mg twice daily.    Historical Provider, MD    pregabalin (LYRICA) 75 MG capsule Take 225 mg by mouth 2 (two) times daily. Takes 150 mg and 75 mg to equal 225 mg twice daily.    Historical Provider, MD  PROAIR HFA 108 (90 Base) MCG/ACT inhaler INHALE 2 PUFFS INTO THE LUNGS EVERY 6 (SIX) HOURS AS NEEDED FOR WHEEZING OR SHORTNESS OF BREATH. 08/05/15   Chesley Mires, MD  promethazine (PHENERGAN) 25 MG tablet Take 25 mg by mouth every 6 (six) hours as needed for nausea or vomiting.  Historical Provider, MD  rizatriptan (MAXALT) 10 MG tablet Take 10 mg by mouth as needed for migraine. May repeat in 2 hours if needed    Historical Provider, MD  topiramate (TOPAMAX) 100 MG tablet Take 100 mg by mouth 2 (two) times daily.    Historical Provider, MD    Family History Family History  Problem Relation Age of Onset  . Hypertension Mother   . Sickle cell trait Maternal Aunt   . Sickle cell anemia Other   . Schizophrenia Son   . Depression Daughter     Social History Social History  Substance Use Topics  . Smoking status: Never Smoker  . Smokeless tobacco: Never Used     Comment: Pt was exposed to 2nd hand smoke at work years ago.  . Alcohol use No     Allergies   Cymbalta [duloxetine hcl]; Latex; and Morphine and related   Review of Systems Review of Systems  Constitutional: Negative for chills and fever.  Respiratory: Negative for cough, chest tightness and shortness of breath.   Cardiovascular: Negative for chest pain, palpitations and leg swelling.  Gastrointestinal: Positive for abdominal pain, blood in stool and nausea. Negative for constipation, diarrhea and vomiting.  Genitourinary: Negative for dysuria, flank pain, pelvic pain, vaginal bleeding, vaginal discharge and vaginal pain.  Musculoskeletal: Negative for arthralgias, myalgias, neck pain and neck stiffness.  Skin: Negative for rash.  Neurological: Negative for dizziness, weakness and headaches.  All other systems reviewed and are negative.    Physical Exam Updated  Vital Signs BP 164/96 (BP Location: Left Arm)   Pulse 106   Temp 99.3 F (37.4 C) (Oral)   Resp 22   Ht 5\' 2"  (1.575 m)   Wt 136.1 kg   LMP 11/24/2015   SpO2 100%   BMI 54.87 kg/m   Physical Exam  Constitutional: She appears well-developed and well-nourished. No distress.  Morbidly obese  HENT:  Head: Normocephalic.  Eyes: Conjunctivae are normal.  Neck: Neck supple.  Cardiovascular: Normal rate, regular rhythm and normal heart sounds.   Pulmonary/Chest: Effort normal and breath sounds normal. No respiratory distress. She has no wheezes. She has no rales.  Abdominal: Soft. Bowel sounds are normal. She exhibits no distension. There is tenderness. There is no rebound.  ttp in epigastric, LUQ  Genitourinary: Rectal exam shows guaiac positive stool.  Genitourinary Comments: Normal rectum. Stool is brown. No hemorroids, fissurs  Musculoskeletal: She exhibits no edema.  Neurological: She is alert.  Skin: Skin is warm and dry.  Psychiatric: She has a normal mood and affect. Her behavior is normal.  Nursing note and vitals reviewed.    ED Treatments / Results  Labs (all labs ordered are listed, but only abnormal results are displayed) Labs Reviewed  LIPASE, BLOOD  COMPREHENSIVE METABOLIC PANEL  CBC  URINALYSIS, ROUTINE W REFLEX MICROSCOPIC (NOT AT [facility])  OCCULT BLOOD X 1 CARD TO LAB, STOOL  I-STAT BETA HCG BLOOD, ED ([facility], [facility], [facility] ONLY)    EKG  EKG Interpretation None       Radiology Ct Abdomen Pelvis W Contrast  Result Date: 12/17/2015 CLINICAL DATA:  Pt c/o intermittent upper abdominal pain with nausea. Pt denies V/D/ constipation. Pt states 4 days ago she noticed bright red blood with bowel movements^119mL ISOVUE-300 IOPAMIDOL (ISOVUE-300) INJECTION 61% EXAM: CT ABDOMEN AND PELVIS WITH CONTRAST TECHNIQUE: Multidetector CT imaging of the abdomen and pelvis was performed using the standard protocol following bolus administration of intravenous contrast. CONTRAST:  143mL  ISOVUE-300  IOPAMIDOL (ISOVUE-300) INJECTION 61% COMPARISON:  07/22/2015 FINDINGS: Lower chest: Focal areas of air trapping at the lung bases. Minimal atelectasis at the left base. Heart size is normal. No imaged pericardial effusion or significant coronary artery calcifications. Hepatobiliary: Gallbladder is contracted and normal in appearance. There is diffuse low-attenuation of the liver consistent with hepatic steatosis. No focal liver lesions are identified. Pancreas: Normal in appearance. Spleen: Normal in appearance. Renal/Adrenal: Normal adrenal glands. Symmetric enhancement and excretion from both kidneys. No renal mass hydronephrosis. Gastrointestinal tract: The stomach and small bowel loops are normal in appearance. The appendix is well seen and has a normal appearance. Colonic loops are normal in appearance. Normal appearance of the rectum. Reproductive/Pelvis: The uterus is present. Ovaries are normal in CT appearance. There is no free pelvic fluid. Vascular/Lymphatic: No retroperitoneal or mesenteric adenopathy. No evidence for aortic aneurysm. Musculoskeletal/Abdominal wall: Visualized osseous structures have a normal appearance. Anterior abdominal wall is unremarkable in appearance. Other: none IMPRESSION: 1.  No evidence for acute abdominal or pelvic abnormality. 2. Hepatic steatosis. 3. Normal appendix. Electronically Signed   By: Nolon Nations M.D.   On: 12/17/2015 13:44    Procedures Procedures (including critical care time)  Medications Ordered in ED Medications - No data to display   Initial Impression / Assessment and Plan / ED Course  I have reviewed the triage vital signs and the nursing notes.  Pertinent labs & imaging results that were available during my care of the patient were reviewed by me and considered in my medical decision making (see chart for details).  Clinical Course   Pt in ED with abdominal pain and rectal bleeding. Rectal exam is normal. Stool brown, hem  positive. Will get labs and CT abd pelvis.   2:35 PM CT negative. Pt with no vomiting, tolerating Pos. Hgb close to base line. No active hemorrhage. Plan to dc home with GI follow up. VS normal. Stable for dc.   Vitals:   12/17/15 1110 12/17/15 1226  BP: 164/96 152/76  Pulse: 106 97  Resp: 22 18  Temp: 99.3 F (37.4 C)        Final Clinical Impressions(s) / ED Diagnoses   Final diagnoses:  Rectal bleeding  Nonalcoholic hepatosteatosis    New Prescriptions Discharge Medication List as of 12/17/2015  2:41 PM       Jeannett Senior, PA-C 12/17/15 2005    Quintella Reichert, MD 12/18/15 [phone]

## 2015-12-18 ENCOUNTER — Other Ambulatory Visit: Payer: Self-pay | Admitting: Adult Health

## 2015-12-18 DIAGNOSIS — R110 Nausea: Secondary | ICD-10-CM

## 2015-12-18 DIAGNOSIS — D259 Leiomyoma of uterus, unspecified: Secondary | ICD-10-CM

## 2015-12-18 DIAGNOSIS — N390 Urinary tract infection, site not specified: Secondary | ICD-10-CM

## 2015-12-26 ENCOUNTER — Other Ambulatory Visit: Payer: Self-pay

## 2015-12-27 ENCOUNTER — Emergency Department (HOSPITAL_COMMUNITY): Payer: Medicare Other

## 2015-12-27 ENCOUNTER — Encounter (HOSPITAL_COMMUNITY): Payer: Self-pay

## 2015-12-27 ENCOUNTER — Emergency Department (HOSPITAL_COMMUNITY)
Admission: EM | Admit: 2015-12-27 | Discharge: 2015-12-27 | Disposition: A | Discharge status: Home / Self Care | Payer: Medicare Other | Attending: Emergency Medicine | Admitting: Emergency Medicine

## 2015-12-27 DIAGNOSIS — J45909 Unspecified asthma, uncomplicated: Secondary | ICD-10-CM | POA: Diagnosis not present

## 2015-12-27 DIAGNOSIS — I10 Essential (primary) hypertension: Secondary | ICD-10-CM | POA: Insufficient documentation

## 2015-12-27 DIAGNOSIS — Z8541 Personal history of malignant neoplasm of cervix uteri: Secondary | ICD-10-CM | POA: Insufficient documentation

## 2015-12-27 DIAGNOSIS — Z79899 Other long term (current) drug therapy: Secondary | ICD-10-CM | POA: Insufficient documentation

## 2015-12-27 DIAGNOSIS — Z7951 Long term (current) use of inhaled steroids: Secondary | ICD-10-CM | POA: Insufficient documentation

## 2015-12-27 DIAGNOSIS — Z91040 Latex allergy status: Secondary | ICD-10-CM | POA: Insufficient documentation

## 2015-12-27 DIAGNOSIS — R609 Edema, unspecified: Secondary | ICD-10-CM | POA: Insufficient documentation

## 2015-12-27 LAB — BASIC METABOLIC PANEL
ANION GAP: 6 (ref 5–15)
BUN: 10 mg/dL (ref 6–20)
CALCIUM: 9.1 mg/dL (ref 8.9–10.3)
CO2: 26 mmol/L (ref 22–32)
Chloride: 107 mmol/L (ref 101–111)
Creatinine, Ser: 0.68 mg/dL (ref 0.44–1.00)
Glucose, Bld: 115 mg/dL — ABNORMAL HIGH (ref 65–99)
Potassium: 2.8 mmol/L — ABNORMAL LOW (ref 3.5–5.1)
SODIUM: 139 mmol/L (ref 135–145)

## 2015-12-27 LAB — CBC
HCT: 31.6 % — ABNORMAL LOW (ref 36.0–46.0)
HEMOGLOBIN: 9.4 g/dL — AB (ref 12.0–15.0)
MCH: 19.6 pg — ABNORMAL LOW (ref 26.0–34.0)
MCHC: 29.7 g/dL — ABNORMAL LOW (ref 30.0–36.0)
MCV: 66 fL — ABNORMAL LOW (ref 78.0–100.0)
Platelets: 286 10*3/uL (ref 150–400)
RBC: 4.79 MIL/uL (ref 3.87–5.11)
RDW: 18 % — ABNORMAL HIGH (ref 11.5–15.5)
WBC: 11.3 10*3/uL — ABNORMAL HIGH (ref 4.0–10.5)

## 2015-12-27 LAB — I-STAT TROPONIN, ED: TROPONIN I, POC: 0 ng/mL (ref 0.00–0.08)

## 2015-12-27 MED ORDER — HYDROMORPHONE HCL 1 MG/ML IJ SOLN: 2.0000 mg | Freq: Once | INTRAMUSCULAR | Status: DC

## 2015-12-27 MED ORDER — POTASSIUM CHLORIDE CRYS ER 20 MEQ PO TBCR: 40.0000 meq | EXTENDED_RELEASE_TABLET | Freq: Once | ORAL | Status: DC

## 2015-12-27 MED ORDER — HYDROMORPHONE HCL 1 MG/ML IJ SOLN: 1.0000 mg | Freq: Once | INTRAMUSCULAR | Status: DC

## 2015-12-27 MED ORDER — POTASSIUM CHLORIDE CRYS ER 20 MEQ PO TBCR
60.0000 meq | EXTENDED_RELEASE_TABLET | Freq: Once | ORAL | Status: AC
  Administered 2015-12-27: 60 meq via ORAL
  Filled 2015-12-27: qty 3

## 2015-12-27 MED ORDER — HYDROMORPHONE HCL 2 MG/ML IJ SOLN
2.0000 mg | Freq: Once | INTRAMUSCULAR | Status: AC
  Administered 2015-12-27: 2 mg via INTRAMUSCULAR
  Filled 2015-12-27: qty 1

## 2015-12-27 NOTE — ED Provider Notes (Signed)
[facility] DEPT Provider Note   CSN: [id number] Arrival date & time: 12/27/15  1732     History   Chief Complaint Chief Complaint  Patient presents with  . Leg Swelling    BILATERAL    HPI Cheyenne Arias is a 40 y.o. female.  40 year old female history morbid obesity presents with one-week history of bilateral lower extremity edema. Denies any orthopnea or dyspnea on exertion. No chest or chest pressure. Does take 160 mg of Lasix a day. States compliance with this. No recent changes to her diet. Describes tightness in her legs which is worse when she stands. Denies any history of trauma. Patient also has chronic lower back pain which is similar to her prior.      Past Medical History:  Diagnosis Date  . Allergy    seasonal  . Anemia    goes to [facility], ferahem  . Anxiety   . Arthritis   . Asthma   . Cancer [facility])    Cervical cancer  . Depression   . Fatty liver disease, nonalcoholic   . Fibromyalgia   . Fibromyalgia   . Food poisoning   . GERD (gastroesophageal reflux disease)   . 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 Hypertension   . Lumbar radiculopathy   . Migraines   . Neuropathy ([place])   . Obesity   . OSA (obstructive sleep apnea)   . OSA on CPAP   . Peripheral neuropathy ([place])   . Shortness of breath dyspnea   . Spinal stenosis     Patient Active Problem List   Diagnosis Date Noted  . BV (bacterial vaginosis) 10/23/2015  . Pelvic pain in female 10/23/2015  . CAP (community acquired pneumonia) 07/25/2015  . Asthma, chronic 07/02/2015  . OSA (obstructive sleep apnea) 07/02/2015  . Hypokalemia 07/02/2015  . Sepsis due to pneumonia ([place]) 07/02/2015  . Leukocytosis 07/02/2015  . IDA (iron deficiency anemia) 07/02/2015  . Benign essential HTN 07/12/2011  . Fibromyalgia 07/26/2009    Past Surgical History:  Procedure Laterality Date  . CARPAL TUNNEL RELEASE    . [place]   . CESAREAN SECTION  11/28/2011   Procedure: CESAREAN SECTION;  Surgeon: Woodroe Mode, MD;  Location: [facility];  Service: Obstetrics;  Laterality: N/A;  . COLPOSCOPY    . HYSTEROSCOPY W/D&C N/A 08/17/2015   Procedure: DILATATION AND CURETTAGE /HYSTEROSCOPY;  Surgeon: Ena Dawley, MD;  Location: [facility];  Service: Gynecology;  Laterality: N/A;  . MANDIBLE SURGERY  2008  . WISDOM TOOTH EXTRACTION      OB History    Gravida Para Term Preterm AB Living   3 3 3     3    SAB TAB Ectopic Multiple Live Births           3       Home Medications    Prior to Admission medications   Medication Sig Start Date End Date Taking? Authorizing Provider  amLODipine (NORVASC) 10 MG tablet Take 10 mg by mouth daily.    Historical Provider, MD  ARIPiprazole (ABILIFY) 2 MG tablet Take 1 tablet (2 mg total) by mouth daily. 10/14/15   Merian Capron, MD  budesonide (PULMICORT) 0.5 MG/2ML nebulizer solution Take 2 mLs (0.5 mg total) by nebulization 2 (two) times daily. Dx Code: Chronic Asthma J45.909 08/11/15   Tammy S Parrett, NP  clonazePAM (KLONOPIN) 0.5 MG tablet Take 1 tablet (0.5 mg total) by mouth 2 (  two) times daily as needed for anxiety. 10/14/15   Merian Capron, MD  cyclobenzaprine (FLEXERIL) 10 MG tablet Take 10 mg by mouth 3 (three) times daily as needed for muscle spasms.     Historical Provider, MD  ferumoxytol Shirlean Kelly) 510 MG/17ML SOLN injection Inject 510 mg into the vein every 3 (three) months.    Historical Provider, MD  FLUoxetine (PROZAC) 20 MG capsule Take 1 capsule (20 mg total) by mouth daily. 10/14/15   Merian Capron, MD  fluticasone (FLONASE) 50 MCG/ACT nasal spray Place 2 sprays into both nostrils 2 (two) times daily.    Historical Provider, MD  furosemide (LASIX) 80 MG tablet Take 80 mg by mouth 2 (two) times daily.    Historical Provider, MD  HYDROmorphone (DILAUDID) 2 MG tablet Take 1 tablet (2 mg total) by mouth every 4 (four) hours as needed for severe pain. 08/17/15   Ena Dawley, MD  ipratropium-albuterol (DUONEB)  0.5-2.5 (3) MG/3ML SOLN Take 3 mLs by nebulization every 4 (four) hours as needed (for wheezing/shortness of breath). 06/17/15   Tammy S Parrett, NP  ketorolac (TORADOL) 10 MG tablet Take 1 tablet (10 mg total) by mouth every 6 (six) hours as needed. 10/23/15   Farrel Gordon, CNM  lactulose (CHRONULAC) 10 GM/15ML solution Take 10 g by mouth 3 (three) times daily as needed for mild constipation.    Historical Provider, MD  loratadine (CLARITIN) 10 MG tablet TAKE 1 TABLET BY MOUTH AT BEDTIME 12/21/15   Tammy S Parrett, NP  metoprolol tartrate (LOPRESSOR) 25 MG tablet Take 25 mg by mouth every morning.     Historical Provider, MD  metroNIDAZOLE (FLAGYL) 500 MG tablet Take 1 tablet (500 mg total) by mouth 2 (two) times daily. 10/23/15   Farrel Gordon, CNM  montelukast (SINGULAIR) 10 MG tablet Take 1 tablet (10 mg total) by mouth at bedtime. 06/01/15   Tammy S Parrett, NP  Multiple Vitamins-Minerals (MULTIVITAMIN WITH MINERALS) tablet Take 1 tablet by mouth daily.    Historical Provider, MD  ondansetron (ZOFRAN) 8 MG tablet Take 1 tablet (8 mg total) by mouth every 8 (eight) hours as needed for nausea or vomiting. 03/19/15   Lori A Clemmons, CNM  oxyCODONE (ROXICODONE) 15 MG immediate release tablet Take 15 mg by mouth every 6 (six) hours. 06/09/15   Historical Provider, MD  pantoprazole (PROTONIX) 40 MG tablet TAKE 1 TABLET (40 MG TOTAL) BY MOUTH DAILY. 11/16/15   Tammy S Parrett, NP  potassium chloride SA (K-DUR,KLOR-CON) 20 MEQ tablet Take 20 mEq by mouth 2 (two) times daily.    Historical Provider, MD  pregabalin (LYRICA) 150 MG capsule Take 225 mg by mouth 2 (two) times daily. Takes 150 mg and 75 mg to equal 225 mg twice daily.    Historical Provider, MD  pregabalin (LYRICA) 75 MG capsule Take 225 mg by mouth 2 (two) times daily. Takes 150 mg and 75 mg to equal 225 mg twice daily.    Historical Provider, MD  PROAIR HFA 108 (90 Base) MCG/ACT inhaler INHALE 2 PUFFS INTO THE LUNGS EVERY 6 (SIX) HOURS AS NEEDED  FOR WHEEZING OR SHORTNESS OF BREATH. 08/05/15   Chesley Mires, MD  promethazine (PHENERGAN) 25 MG tablet Take 25 mg by mouth every 6 (six) hours as needed for nausea or vomiting.    Historical Provider, MD  rizatriptan (MAXALT) 10 MG tablet Take 10 mg by mouth as needed for migraine. May repeat in 2 hours if needed    Historical Provider, MD  topiramate (TOPAMAX) 100 MG tablet Take 100 mg by mouth 2 (two) times daily.    Historical Provider, MD    Family History Family History  Problem Relation Age of Onset  . Hypertension Mother   . Sickle cell trait Maternal Aunt   . Sickle cell anemia Other   . Schizophrenia Son   . Depression Daughter     Social History Social History  Substance Use Topics  . Smoking status: Never Smoker  . Smokeless tobacco: Never Used     Comment: Pt was exposed to 2nd hand smoke at work years ago.  . Alcohol use No     Allergies   Cymbalta [duloxetine hcl]; Latex; and Morphine and related   Review of Systems Review of Systems  All other systems reviewed and are negative.    Physical Exam Updated Vital Signs BP 166/97 (BP Location: Left Arm)   Pulse 104   Temp 98.2 F (36.8 C) (Oral)   Resp 20   Ht 5\' 2"  (1.575 m)   Wt 136.1 kg   LMP 11/24/2015   SpO2 100%   BMI 54.87 kg/m   Physical Exam  Constitutional: She is oriented to person, place, and time. She appears well-developed and well-nourished.  Non-toxic appearance. No distress.  HENT:  Head: Normocephalic and atraumatic.  Eyes: Conjunctivae, EOM and lids are normal. Pupils are equal, round, and reactive to light.  Neck: Normal range of motion. Neck supple. No tracheal deviation present. No thyroid mass present.  Cardiovascular: Normal rate, regular rhythm and normal heart sounds.  Exam reveals no gallop.   No murmur heard. Pulmonary/Chest: Effort normal and breath sounds normal. No stridor. No respiratory distress. She has no decreased breath sounds. She has no wheezes. She has no  rhonchi. She has no rales.  Abdominal: Soft. Normal appearance and bowel sounds are normal. She exhibits no distension. There is no tenderness. There is no rebound and no CVA tenderness.  Musculoskeletal: Normal range of motion. She exhibits no edema or tenderness.  Lymphadenopathy:  3+ bilateral lower extremity pitting edema  Neurological: She is alert and oriented to person, place, and time. She has normal strength. No cranial nerve deficit or sensory deficit. GCS eye subscore is 4. GCS verbal subscore is 5. GCS motor subscore is 6.  Skin: Skin is warm and dry. No abrasion and no rash noted.  Psychiatric: She has a normal mood and affect. Her speech is normal and behavior is normal.  Nursing note and vitals reviewed.    ED Treatments / Results  Labs (all labs ordered are listed, but only abnormal results are displayed) Labs Reviewed  BASIC METABOLIC PANEL - Abnormal; Notable for the following:       Result Value   Potassium 2.8 (*)    Glucose, Bld 115 (*)    All other components within normal limits  CBC - Abnormal; Notable for the following:    WBC 11.3 (*)    Hemoglobin 9.4 (*)    HCT 31.6 (*)    MCV 66.0 (*)    MCH 19.6 (*)    MCHC 29.7 (*)    RDW 18.0 (*)    All other components within normal limits  I-STAT TROPOININ, ED    EKG  EKG Interpretation None       Radiology Dg Chest 2 View  Result Date: 12/27/2015 CLINICAL DATA:  Shortness of breath. EXAM: CHEST  2 VIEW COMPARISON:  09/08/2015 FINDINGS: The cardiac silhouette appears borderline enlarged, accentuated by AP technique.  No airspace consolidation, edema, pleural effusion, or pneumothorax is identified. No acute osseous abnormality is seen. IMPRESSION: Borderline cardiac enlargement. No evidence of acute airspace disease. Electronically Signed   By: Logan Bores M.D.   On: 12/27/2015 19:30    Procedures Procedures (including critical care time)  Medications Ordered in ED Medications  HYDROmorphone  (DILAUDID) injection 2 mg (not administered)  potassium chloride SA (K-DUR,KLOR-CON) CR tablet 60 mEq (not administered)     Initial Impression / Assessment and Plan / ED Course  I have reviewed the triage vital signs and the nursing notes.  Pertinent labs & imaging results that were available during my care of the patient were reviewed by me and considered in my medical decision making (see chart for details).  Clinical Course    Patient's hypokalemia treated with 60 mEq of oral potassium. Chronic pain treated with IM hydromorphone. Encouraged to follow-up with her doctor. No evidence of CHF here.  Final Clinical Impressions(s) / ED Diagnoses   Final diagnoses:  None    New Prescriptions New Prescriptions   No medications on file     Lacretia Leigh, MD 12/27/15 2042

## 2015-12-27 NOTE — ED Notes (Signed)
Called 2x for lab work -  No answer.

## 2015-12-27 NOTE — ED Triage Notes (Addendum)
PT C/O BILATERAL LEG/FEET SWELLING AND PAIN X1 WEEK. PT STS SHE ALSO HAS BACK SPAMS, IN WHICH SHE IS UNABLE TO HOLD HER BLADDER. DENIES CP, BUT HAS SOB WITH EXERTION AT TIMES.

## 2015-12-29 ENCOUNTER — Other Ambulatory Visit: Payer: Self-pay | Admitting: Hematology and Oncology

## 2016-01-02 ENCOUNTER — Telehealth: Payer: Self-pay | Admitting: Hematology and Oncology

## 2016-01-02 ENCOUNTER — Ambulatory Visit (HOSPITAL_BASED_OUTPATIENT_CLINIC_OR_DEPARTMENT_OTHER): Payer: Medicare Other | Admitting: Hematology and Oncology

## 2016-01-02 ENCOUNTER — Ambulatory Visit (HOSPITAL_BASED_OUTPATIENT_CLINIC_OR_DEPARTMENT_OTHER): Payer: Medicare Other

## 2016-01-02 ENCOUNTER — Encounter: Payer: Self-pay | Admitting: Hematology and Oncology

## 2016-01-02 VITALS — BP 122/86 | HR 110 | Temp 98.5°F | Resp 18 | Ht 62.0 in | Wt 288.6 lb

## 2016-01-02 DIAGNOSIS — Z23 Encounter for immunization: Secondary | ICD-10-CM

## 2016-01-02 DIAGNOSIS — K625 Hemorrhage of anus and rectum: Secondary | ICD-10-CM

## 2016-01-02 DIAGNOSIS — N920 Excessive and frequent menstruation with regular cycle: Secondary | ICD-10-CM | POA: Diagnosis not present

## 2016-01-02 DIAGNOSIS — D509 Iron deficiency anemia, unspecified: Secondary | ICD-10-CM

## 2016-01-02 MED ORDER — SODIUM CHLORIDE 0.9 % IV SOLN
Freq: Once | INTRAVENOUS | Status: AC
  Administered 2016-01-02: 12:00:00 via INTRAVENOUS

## 2016-01-02 MED ORDER — INFLUENZA VAC SPLIT QUAD 0.5 ML IM SUSY
0.5000 mL | PREFILLED_SYRINGE | Freq: Once | INTRAMUSCULAR | Status: DC
  Filled 2016-01-02: qty 0.5

## 2016-01-02 MED ORDER — SODIUM CHLORIDE 0.9 % IV SOLN
510.0000 mg | Freq: Once | INTRAVENOUS | Status: AC
  Administered 2016-01-02: 510 mg via INTRAVENOUS
  Filled 2016-01-02: qty 17

## 2016-01-02 NOTE — Assessment & Plan Note (Signed)
She has regular rectal bleeding. CT scan were within normal limits. She has appointment for GI evaluation and I recommend she proceed. This is a most likely cause of her recurrent microcytic anemia

## 2016-01-02 NOTE — Telephone Encounter (Signed)
GAVE PATIENT AVS REPORT AND APPOINTMENTS FOR September. PER 9/18 LOS NO RETURN APPOINTMENT.

## 2016-01-02 NOTE — Assessment & Plan Note (Signed)
I suspect the patient may have a combination of thalassemia, anemia chronic disease and iron deficiency anemia. Despite high ferritin level, her anemia did not correct itself. I suspect the elevated ferritin could be related to other underlying disorder Hemoglobinopathy evaluation is normal but this does not exclude alpha thalassemia.  She complained of persistent menorrhagia and is not able to tolerate estrogen containing implants. The most likely cause of her anemia is due to chronic blood loss. We discussed some of the risks, benefits, and alternatives of intravenous iron infusions. The patient is symptomatic from anemia and the iron level is critically low. She tolerated oral iron supplement poorly and desires to achieved higher levels of iron faster for adequate hematopoesis. Some of the side-effects to be expected including risks of infusion reactions, phlebitis, headaches, nausea and fatigue.  The patient is willing to proceed. Patient education material was dispensed.  Goal is to keep hemoglobin >11 I recommend to doses. She is undergoing GI workup. She will get second dose next week and I recommend she returns her primary doctor to recheck CBC and iron studies in 6 months

## 2016-01-02 NOTE — Progress Notes (Signed)
[facility] OFFICE PROGRESS NOTE  Smothers, Andree Elk, NP SUMMARY OF HEMATOLOGIC HISTORY:  This patient have chronic fatigue, fibromyalgia, uterine fibroids with chronic menorrhagia, severe iron deficiency and reactive thrombocytosis. She had chronic microcytic anemia for many years. In fact, there were no changes with MCV and the highest hemoglobin was 10.7. She had received intermittent intravenous iron infusion in the past, her last infusion was in January 2014. The patient could not tolerate oral iron supplements. She have chronic fatigue with fibromyalgia, obstructive sleep apnea and have been oxygen therapy for 2 years. She takes regular ibuprofen for pelvic pain. She has irregular menstruation and abnormal Pap smear in the past The patient had history of intermittent epistaxis for many years, usually coming from the left nasal passage. The patient has severe microcytic anemia. She received multiple doses of intravenous iron, last given in August 2016  INTERVAL HISTORY: [facility] 40 y.o. female returns for follow-up. She complained of excessive fatigue. She have heavy menstruation and regular rectal bleeding. She is undergoing evaluation for rectal bleeding. She denies spontaneous epistaxis. She does not take regular NSAID. She complained of mild pica   I have reviewed the past medical history, past surgical history, social history and family history with the patient and they are unchanged from previous note.  ALLERGIES:  is allergic to cymbalta [duloxetine hcl]; latex; and morphine and related.  MEDICATIONS:  Current Outpatient Prescriptions  Medication Sig Dispense Refill  . amLODipine (NORVASC) 10 MG tablet Take 10 mg by mouth daily.    . ARIPiprazole (ABILIFY) 2 MG tablet Take 1 tablet (2 mg total) by mouth daily. 30 tablet 2  . budesonide (PULMICORT) 0.5 MG/2ML nebulizer solution Take 2 mLs (0.5 mg total) by nebulization 2 (two) times daily. Dx Code:  Chronic Asthma J45.909 120 mL 5  . clonazePAM (KLONOPIN) 0.5 MG tablet Take 1 tablet (0.5 mg total) by mouth 2 (two) times daily as needed for anxiety. 60 tablet 2  . cyclobenzaprine (FLEXERIL) 10 MG tablet Take 10 mg by mouth 3 (three) times daily as needed for muscle spasms.     . ferumoxytol (FERAHEME) 510 MG/17ML SOLN injection Inject 510 mg into the vein every 3 (three) months.    [name] FLUoxetine (PROZAC) 20 MG capsule Take 1 capsule (20 mg total) by mouth daily. 30 capsule 2  . fluticasone (FLONASE) 50 MCG/ACT nasal spray Place 2 sprays into both nostrils 2 (two) times daily.    . furosemide (LASIX) 80 MG tablet Take 80 mg by mouth 2 (two) times daily.    [name] ipratropium-albuterol (DUONEB) 0.5-2.5 (3) MG/3ML SOLN Take 3 mLs by nebulization every 4 (four) hours as needed (for wheezing/shortness of breath). 360 mL 5  . montelukast (SINGULAIR) 10 MG tablet Take 1 tablet (10 mg total) by mouth at bedtime. 30 tablet 3  . Multiple Vitamins-Minerals (MULTIVITAMIN WITH MINERALS) tablet Take 1 tablet by mouth daily.    [name] oxyCODONE (ROXICODONE) 15 MG immediate release tablet Take 15 mg by mouth every 6 (six) hours.  0  . pantoprazole (PROTONIX) 40 MG tablet TAKE 1 TABLET (40 MG TOTAL) BY MOUTH DAILY. 30 tablet 0  . potassium chloride SA (K-DUR,KLOR-CON) 20 MEQ tablet Take 20 mEq by mouth 2 (two) times daily.    . pregabalin (LYRICA) 75 MG capsule Take 225 mg by mouth 2 (two) times daily. Takes 150 mg and 75 mg to equal 225 mg twice daily.    [name] PROAIR HFA 108 (90 Base) MCG/ACT inhaler  INHALE 2 PUFFS INTO THE LUNGS EVERY 6 (SIX) HOURS AS NEEDED FOR WHEEZING OR SHORTNESS OF BREATH. 8.5 Inhaler 0  . rizatriptan (MAXALT) 10 MG tablet Take 10 mg by mouth as needed for migraine. May repeat in 2 hours if needed    . topiramate (TOPAMAX) 100 MG tablet Take 100 mg by mouth 2 (two) times daily.    [name] HYDROmorphone (DILAUDID) 2 MG tablet Take 1 tablet (2 mg total) by mouth every 4 (four) hours as needed for severe pain.  (Patient not taking: Reported on 01/02/2016) 4 tablet 0  . lactulose (CHRONULAC) 10 GM/15ML solution Take 10 g by mouth 3 (three) times daily as needed for mild constipation.    [name] loratadine (CLARITIN) 10 MG tablet TAKE 1 TABLET BY MOUTH AT BEDTIME 30 tablet 2  . metoprolol tartrate (LOPRESSOR) 25 MG tablet Take 25 mg by mouth every morning.     . metroNIDAZOLE (FLAGYL) 500 MG tablet Take 1 tablet (500 mg total) by mouth 2 (two) times daily. (Patient not taking: Reported on 01/02/2016) 14 tablet 0  . ondansetron (ZOFRAN) 8 MG tablet Take 1 tablet (8 mg total) by mouth every 8 (eight) hours as needed for nausea or vomiting. (Patient not taking: Reported on 01/02/2016) 30 tablet 0  . promethazine (PHENERGAN) 25 MG tablet Take 25 mg by mouth every 6 (six) hours as needed for nausea or vomiting.     Current Facility-Administered Medications  Medication Dose Route Frequency Provider Last Rate Last Dose  . Influenza vac split quadrivalent PF (FLUARIX) injection 0.5 mL  0.5 mL Intramuscular Once Heath Lark, MD         REVIEW OF SYSTEMS:   Constitutional: Denies fevers, chills or night sweats Eyes: Denies blurriness of vision Ears, nose, mouth, throat, and face: Denies mucositis or sore throat Cardiovascular: Denies palpitation, chest discomfort or lower extremity swelling Gastrointestinal:  Denies nausea, heartburn or change in bowel habits Skin: Denies abnormal skin rashes Lymphatics: Denies new lymphadenopathy or easy bruising Neurological:Denies numbness, tingling or new weaknesses Behavioral/Psych: Mood is stable, no new changes  All other systems were reviewed with the patient and are negative.  PHYSICAL EXAMINATION: ECOG PERFORMANCE STATUS: 2 - Symptomatic, <50% confined to bed  Vitals:   01/02/16 1047  BP: 122/86  Pulse: (!) 110  Resp: 18  Temp: 98.5 F (36.9 C)   Filed Weights   01/02/16 1047  Weight: 288 lb 9.6 oz (130.9 kg)    GENERAL:alert, no distress and comfortable. She  is morbidly obese SKIN: skin color, texture, turgor are normal, no rashes or significant lesions EYES: normal, Conjunctiva are pink and non-injected, sclera clear OROPHARYNX:no exudate, no erythema and lips, buccal mucosa, and tongue normal  NECK: supple, thyroid normal size, non-tender, without nodularity LYMPH:  no palpable lymphadenopathy in the cervical, axillary or inguinal LUNGS: clear to auscultation and percussion with normal breathing effort HEART: regular rate & rhythm and no murmurs and no lower extremity edema ABDOMEN:abdomen soft, non-tender and normal bowel sounds Musculoskeletal:no cyanosis of digits and no clubbing  NEURO: alert & oriented x 3 with fluent speech, no focal motor/sensory deficits  LABORATORY DATA:  I have reviewed the data as listed     Component Value Date/Time   NA 139 12/27/2015 1843   NA 139 08/29/2012 1102   K 2.8 (L) 12/27/2015 1843   K 3.2 (L) 08/29/2012 1102   CL 107 12/27/2015 1843   CL 105 08/29/2012 1102   CO2 26 12/27/2015 1843   CO2  23 08/29/2012 1102   GLUCOSE 115 (H) 12/27/2015 1843   GLUCOSE 130 (H) 08/29/2012 1102   BUN 10 12/27/2015 1843   BUN 5.8 (L) 08/29/2012 1102   CREATININE 0.68 12/27/2015 1843   CREATININE 0.8 08/29/2012 1102   CALCIUM 9.1 12/27/2015 1843   CALCIUM 8.6 08/29/2012 1102   PROT 7.6 12/17/2015 1200   PROT 7.3 08/29/2012 1102   ALBUMIN 3.8 12/17/2015 1200   ALBUMIN 3.6 08/29/2012 1102   AST 46 (H) 12/17/2015 1200   AST 22 08/29/2012 1102   ALT 33 12/17/2015 1200   ALT 23 08/29/2012 1102   ALKPHOS 62 12/17/2015 1200   ALKPHOS 86 08/29/2012 1102   BILITOT 0.4 12/17/2015 1200   BILITOT 0.22 08/29/2012 1102   GFRNONAA >60 12/27/2015 1843   GFRAA >60 12/27/2015 1843    No results found for: SPEP, UPEP  Lab Results  Component Value Date   WBC 11.3 (H) 12/27/2015   NEUTROABS 8.4 (H) 09/29/2015   HGB 9.4 (L) 12/27/2015   HCT 31.6 (L) 12/27/2015   MCV 66.0 (L) 12/27/2015   PLT 286 12/27/2015       Chemistry      Component Value Date/Time   NA 139 12/27/2015 1843   NA 139 08/29/2012 1102   K 2.8 (L) 12/27/2015 1843   K 3.2 (L) 08/29/2012 1102   CL 107 12/27/2015 1843   CL 105 08/29/2012 1102   CO2 26 12/27/2015 1843   CO2 23 08/29/2012 1102   BUN 10 12/27/2015 1843   BUN 5.8 (L) 08/29/2012 1102   CREATININE 0.68 12/27/2015 1843   CREATININE 0.8 08/29/2012 1102      Component Value Date/Time   CALCIUM 9.1 12/27/2015 1843   CALCIUM 8.6 08/29/2012 1102   ALKPHOS 62 12/17/2015 1200   ALKPHOS 86 08/29/2012 1102   AST 46 (H) 12/17/2015 1200   AST 22 08/29/2012 1102   ALT 33 12/17/2015 1200   ALT 23 08/29/2012 1102   BILITOT 0.4 12/17/2015 1200   BILITOT 0.22 08/29/2012 1102      ASSESSMENT & PLAN:  IDA (iron deficiency anemia) I suspect the patient may have a combination of thalassemia, anemia chronic disease and iron deficiency anemia. Despite high ferritin level, her anemia did not correct itself. I suspect the elevated ferritin could be related to other underlying disorder Hemoglobinopathy evaluation is normal but this does not exclude alpha thalassemia.  She complained of persistent menorrhagia and is not able to tolerate estrogen containing implants. The most likely cause of her anemia is due to chronic blood loss. We discussed some of the risks, benefits, and alternatives of intravenous iron infusions. The patient is symptomatic from anemia and the iron level is critically low. She tolerated oral iron supplement poorly and desires to achieved higher levels of iron faster for adequate hematopoesis. Some of the side-effects to be expected including risks of infusion reactions, phlebitis, headaches, nausea and fatigue.  The patient is willing to proceed. Patient education material was dispensed.  Goal is to keep hemoglobin >11 I recommend to doses. She is undergoing GI workup. She will get second dose next week and I recommend she returns her primary doctor to recheck CBC  and iron studies in 6 months  Rectal bleeding She has regular rectal bleeding. CT scan were within normal limits. She has appointment for GI evaluation and I recommend she proceed. This is a most likely cause of her recurrent microcytic anemia  Menorrhagia with regular cycle She has heavy menstruation and  have a history of abnormal Pap smear. She was not able to tolerate estrogen containing implant as it caused significant weight gain and swelling. I would defer to her gynecologist to discuss about treatment options.   All questions were answered. The patient knows to call the clinic with any problems, questions or concerns. No barriers to learning was detected.  I spent 15 minutes counseling the patient face to face. The total time spent in the appointment was 20 minutes and more than 50% was on counseling.     [facility], Bowie Delia, MD 9/18/201710:59 AM

## 2016-01-02 NOTE — Assessment & Plan Note (Signed)
She has heavy menstruation and have a history of abnormal Pap smear. She was not able to tolerate estrogen containing implant as it caused significant weight gain and swelling. I would defer to her gynecologist to discuss about treatment options. 

## 2016-01-10 ENCOUNTER — Ambulatory Visit (HOSPITAL_BASED_OUTPATIENT_CLINIC_OR_DEPARTMENT_OTHER): Payer: Medicare Other

## 2016-01-10 VITALS — BP 137/90 | HR 97 | Temp 98.6°F | Resp 18

## 2016-01-10 DIAGNOSIS — D509 Iron deficiency anemia, unspecified: Secondary | ICD-10-CM

## 2016-01-10 MED ORDER — SODIUM CHLORIDE 0.9 % IV SOLN
510.0000 mg | Freq: Once | INTRAVENOUS | Status: AC
  Administered 2016-01-10: 510 mg via INTRAVENOUS
  Filled 2016-01-10: qty 17

## 2016-01-10 MED ORDER — SODIUM CHLORIDE 0.9 % IV SOLN
Freq: Once | INTRAVENOUS | Status: AC
  Administered 2016-01-10: 09:00:00 via INTRAVENOUS

## 2016-01-10 NOTE — Patient Instructions (Signed)

## 2016-01-10 NOTE — Progress Notes (Signed)
Patient tolerated feraheme infusion well. Monitored patient 30 minutes post transfusion. Vital signs stable, patient stable upon discharge.

## 2016-01-12 ENCOUNTER — Encounter (HOSPITAL_COMMUNITY): Payer: Self-pay | Admitting: Psychiatry

## 2016-01-14 ENCOUNTER — Other Ambulatory Visit: Payer: Self-pay | Admitting: Adult Health

## 2016-01-16 ENCOUNTER — Inpatient Hospital Stay (HOSPITAL_COMMUNITY): Payer: Medicare Other

## 2016-01-16 ENCOUNTER — Ambulatory Visit (HOSPITAL_COMMUNITY): Payer: Self-pay | Admitting: Psychiatry

## 2016-01-16 ENCOUNTER — Inpatient Hospital Stay (HOSPITAL_COMMUNITY)
Admission: AD | Admit: 2016-01-16 | Discharge: 2016-01-17 | Disposition: A | Discharge status: Home / Self Care | Payer: Medicare Other | Source: Ambulatory Visit | Attending: Obstetrics and Gynecology | Admitting: Obstetrics and Gynecology

## 2016-01-16 ENCOUNTER — Encounter (HOSPITAL_COMMUNITY): Payer: Self-pay | Admitting: *Deleted

## 2016-01-16 DIAGNOSIS — Z3202 Encounter for pregnancy test, result negative: Secondary | ICD-10-CM | POA: Insufficient documentation

## 2016-01-16 DIAGNOSIS — R102 Pelvic and perineal pain: Secondary | ICD-10-CM | POA: Diagnosis not present

## 2016-01-16 DIAGNOSIS — J45909 Unspecified asthma, uncomplicated: Secondary | ICD-10-CM | POA: Insufficient documentation

## 2016-01-16 DIAGNOSIS — N3001 Acute cystitis with hematuria: Secondary | ICD-10-CM

## 2016-01-16 DIAGNOSIS — R109 Unspecified abdominal pain: Secondary | ICD-10-CM | POA: Diagnosis present

## 2016-01-16 DIAGNOSIS — F419 Anxiety disorder, unspecified: Secondary | ICD-10-CM | POA: Insufficient documentation

## 2016-01-16 LAB — CBC WITH DIFFERENTIAL/PLATELET
BASOS PCT: 0 %
Basophils Absolute: 0 10*3/uL (ref 0.0–0.1)
EOS ABS: 0.1 10*3/uL (ref 0.0–0.7)
Eosinophils Relative: 1 %
HEMATOCRIT: 33 % — AB (ref 36.0–46.0)
Hemoglobin: 9.7 g/dL — ABNORMAL LOW (ref 12.0–15.0)
Lymphocytes Relative: 14 %
Lymphs Abs: 1.7 10*3/uL (ref 0.7–4.0)
MCH: 20.7 pg — ABNORMAL LOW (ref 26.0–34.0)
MCHC: 29.4 g/dL — AB (ref 30.0–36.0)
MCV: 70.5 fL — ABNORMAL LOW (ref 78.0–100.0)
MONO ABS: 0.4 10*3/uL (ref 0.1–1.0)
MONOS PCT: 3 %
NEUTROS ABS: 10.3 10*3/uL — AB (ref 1.7–7.7)
Neutrophils Relative %: 82 %
Platelets: 381 10*3/uL (ref 150–400)
RBC: 4.68 MIL/uL (ref 3.87–5.11)
RDW: 23.5 % — AB (ref 11.5–15.5)
WBC: 12.6 10*3/uL — ABNORMAL HIGH (ref 4.0–10.5)

## 2016-01-16 LAB — WET PREP, GENITAL
Sperm: NONE SEEN
TRICH WET PREP: NONE SEEN
Yeast Wet Prep HPF POC: NONE SEEN

## 2016-01-16 LAB — URINALYSIS, ROUTINE W REFLEX MICROSCOPIC
BILIRUBIN URINE: NEGATIVE
GLUCOSE, UA: NEGATIVE mg/dL
KETONES UR: NEGATIVE mg/dL
Leukocytes, UA: NEGATIVE
Nitrite: NEGATIVE
PH: 6.5 (ref 5.0–8.0)
Protein, ur: NEGATIVE mg/dL
Specific Gravity, Urine: 1.02 (ref 1.005–1.030)

## 2016-01-16 LAB — URINE MICROSCOPIC-ADD ON
Bacteria, UA: NONE SEEN
WBC UA: NONE SEEN WBC/hpf (ref 0–5)

## 2016-01-16 LAB — POCT PREGNANCY, URINE: Preg Test, Ur: NEGATIVE

## 2016-01-16 MED ORDER — LACTATED RINGERS IV SOLN: INTRAVENOUS | Status: DC

## 2016-01-16 MED ORDER — IOPAMIDOL (ISOVUE-300) INJECTION 61%
100.0000 mL | Freq: Once | INTRAVENOUS | Status: AC | PRN
  Administered 2016-01-16: 100 mL via INTRAVENOUS

## 2016-01-16 MED ORDER — SODIUM CHLORIDE 0.9 % IV SOLN
INTRAVENOUS | Status: DC
  Administered 2016-01-16: 125 mL/h via INTRAVENOUS

## 2016-01-16 MED ORDER — METRONIDAZOLE 500 MG PO TABS
500.0000 mg | ORAL_TABLET | Freq: Once | ORAL | Status: AC
  Administered 2016-01-16: 500 mg via ORAL
  Filled 2016-01-16: qty 1

## 2016-01-16 MED ORDER — CEFTRIAXONE SODIUM 2 G IJ SOLR
2.0000 g | Freq: Once | INTRAMUSCULAR | Status: AC
  Administered 2016-01-16: 2 g via INTRAVENOUS
  Filled 2016-01-16: qty 2

## 2016-01-16 MED ORDER — IOPAMIDOL (ISOVUE-300) INJECTION 61%
30.0000 mL | Freq: Once | INTRAVENOUS | Status: AC
Start: 2016-01-16 — End: 2016-01-16
  Administered 2016-01-16: 30 mL via ORAL

## 2016-01-16 MED ORDER — KETOROLAC TROMETHAMINE 30 MG/ML IJ SOLN
30.0000 mg | Freq: Once | INTRAMUSCULAR | Status: AC
  Administered 2016-01-16: 30 mg via INTRAMUSCULAR
  Filled 2016-01-16: qty 1

## 2016-01-16 NOTE — MAU Provider Note (Signed)
History     CSN: [id number]  Arrival date and time: 01/16/16 1442   None     Chief Complaint  Patient presents with  . Abdominal Pain  . Hematuria  . Nausea  . Fever   Non-pregnant female c/o lower abdominal pain and low grade fever x3 days. She reports gross hematuria. She reports urinary urgency and frequency. She denies vaginal discharge, itching, or odor. She reports nausea 2/2 the pain. She took Ibuprofen and Percocet this am with some relief.   Abdominal Pain  This is a new problem. Episode onset: 3 days ago. The problem occurs intermittently. The problem has been unchanged. The pain is located in the suprapubic region. The pain is at a severity of 10/10. The quality of the pain is cramping. The abdominal pain does not radiate. Associated symptoms include dysuria, a fever, frequency, hematuria and nausea. Pertinent negatives include no constipation, diarrhea or vomiting. Nothing aggravates the pain. The pain is relieved by nothing. She has tried oral narcotic analgesics for the symptoms. The treatment provided no relief.   Past Medical History:  Diagnosis Date  . Allergy    seasonal  . Anemia    goes to [facility], [place]  . Anxiety   . Arthritis   . Asthma   . Cancer [facility])    Cervical cancer  . Depression   . Fatty liver disease, nonalcoholic   . Fibromyalgia   . Fibromyalgia   . Food poisoning   . GERD (gastroesophageal reflux disease)   . 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 Hypertension   . Lumbar radiculopathy   . Migraines   . Neuropathy ([place])   . Obesity   . OSA (obstructive sleep apnea)   . OSA on CPAP   . Peripheral neuropathy ([place])   . Shortness of breath dyspnea   . Spinal stenosis     Past Surgical History:  Procedure Laterality Date  . CARPAL TUNNEL RELEASE    . [place]   . CESAREAN SECTION  11/28/2011   Procedure: CESAREAN SECTION;  Surgeon: Woodroe Mode, MD;  Location: [facility];  Service: Obstetrics;  Laterality:  N/A;  . COLPOSCOPY    . HYSTEROSCOPY W/D&C N/A 08/17/2015   Procedure: DILATATION AND CURETTAGE /HYSTEROSCOPY;  Surgeon: Ena Dawley, MD;  Location: [facility];  Service: Gynecology;  Laterality: N/A;  . MANDIBLE SURGERY  2008  . WISDOM TOOTH EXTRACTION      Family History  Problem Relation Age of Onset  . Hypertension Mother   . Sickle cell trait Maternal Aunt   . Sickle cell anemia Other   . Schizophrenia Son   . Depression Daughter     Social History  Substance Use Topics  . Smoking status: Never Smoker  . Smokeless tobacco: Never Used     Comment: Pt was exposed to 2nd hand smoke at work years ago.  . Alcohol use No    Allergies:  Allergies  Allergen Reactions  . Cymbalta [Duloxetine Hcl] Other (See Comments)    Head felt cold and tight, loss of conciousness  . Latex Itching, Swelling and Other (See Comments)    Reaction:  Blisters   . Morphine And Related Itching    Prescriptions Prior to Admission  Medication Sig Dispense Refill Last Dose  . amLODipine (NORVASC) 10 MG tablet Take 10 mg by mouth daily.   Taking  . ARIPiprazole (ABILIFY) 2 MG tablet Take 1 tablet (2 mg  total) by mouth daily. 30 tablet 2 Taking  . budesonide (PULMICORT) 0.5 MG/2ML nebulizer solution Take 2 mLs (0.5 mg total) by nebulization 2 (two) times daily. Dx Code: Chronic Asthma J45.909 120 mL 5 Taking  . clonazePAM (KLONOPIN) 0.5 MG tablet Take 1 tablet (0.5 mg total) by mouth 2 (two) times daily as needed for anxiety. 60 tablet 2 Taking  . cyclobenzaprine (FLEXERIL) 10 MG tablet Take 10 mg by mouth 3 (three) times daily as needed for muscle spasms.    Taking  . ferumoxytol (FERAHEME) 510 MG/17ML SOLN injection Inject 510 mg into the vein every 3 (three) months.   Taking  . FLUoxetine (PROZAC) 20 MG capsule Take 1 capsule (20 mg total) by mouth daily. 30 capsule 2 Taking  . fluticasone (FLONASE) 50 MCG/ACT nasal spray Place 2 sprays into both nostrils 2 (two) times daily.   Taking  . furosemide  (LASIX) 80 MG tablet Take 80 mg by mouth 2 (two) times daily.   Taking  . HYDROmorphone (DILAUDID) 2 MG tablet Take 1 tablet (2 mg total) by mouth every 4 (four) hours as needed for severe pain. (Patient not taking: Reported on 01/02/2016) 4 tablet 0 Not Taking  . ipratropium-albuterol (DUONEB) 0.5-2.5 (3) MG/3ML SOLN Take 3 mLs by nebulization every 4 (four) hours as needed (for wheezing/shortness of breath). 360 mL 5 Taking  . lactulose (CHRONULAC) 10 GM/15ML solution Take 10 g by mouth 3 (three) times daily as needed for mild constipation.   Not Taking  . loratadine (CLARITIN) 10 MG tablet TAKE 1 TABLET BY MOUTH AT BEDTIME 30 tablet 2   . metoprolol tartrate (LOPRESSOR) 25 MG tablet Take 25 mg by mouth every morning.    10/22/2015 at Unknown time  . metroNIDAZOLE (FLAGYL) 500 MG tablet Take 1 tablet (500 mg total) by mouth 2 (two) times daily. (Patient not taking: Reported on 01/02/2016) 14 tablet 0 Not Taking  . montelukast (SINGULAIR) 10 MG tablet Take 1 tablet (10 mg total) by mouth at bedtime. 30 tablet 3 Taking  . Multiple Vitamins-Minerals (MULTIVITAMIN WITH MINERALS) tablet Take 1 tablet by mouth daily.   Taking  . ondansetron (ZOFRAN) 8 MG tablet Take 1 tablet (8 mg total) by mouth every 8 (eight) hours as needed for nausea or vomiting. (Patient not taking: Reported on 01/02/2016) 30 tablet 0 Not Taking  . oxyCODONE (ROXICODONE) 15 MG immediate release tablet Take 15 mg by mouth every 6 (six) hours.  0 Taking  . potassium chloride SA (K-DUR,KLOR-CON) 20 MEQ tablet Take 20 mEq by mouth 2 (two) times daily.   Taking  . pregabalin (LYRICA) 75 MG capsule Take 225 mg by mouth 2 (two) times daily. Takes 150 mg and 75 mg to equal 225 mg twice daily.   Taking  . PROAIR HFA 108 (90 Base) MCG/ACT inhaler INHALE 2 PUFFS INTO THE LUNGS EVERY 6 (SIX) HOURS AS NEEDED FOR WHEEZING OR SHORTNESS OF BREATH. 8.5 Inhaler 0 Taking  . promethazine (PHENERGAN) 25 MG tablet Take 25 mg by mouth every 6 (six) hours as  needed for nausea or vomiting.   Not Taking  . rizatriptan (MAXALT) 10 MG tablet Take 10 mg by mouth as needed for migraine. May repeat in 2 hours if needed   Taking  . topiramate (TOPAMAX) 100 MG tablet Take 100 mg by mouth 2 (two) times daily.   Taking    Review of Systems  Constitutional: Positive for chills and fever.  Gastrointestinal: Positive for abdominal pain and nausea. Negative  for constipation, diarrhea and vomiting.  Genitourinary: Positive for dysuria, frequency, hematuria and urgency. Negative for flank pain.  Neurological: Positive for dizziness.   Physical Exam   Blood pressure 133/65, pulse 113, temperature 99.2 F (37.3 C), temperature source Oral, resp. rate 18, height 5\' 2"  (1.575 m), weight 129.8 kg (286 lb 3.2 oz), last menstrual period 01/06/2016, SpO2 100 %.  Physical Exam  Constitutional: She is oriented to person, place, and time. She appears well-developed and well-nourished.  HENT:  Head: Normocephalic and atraumatic.  Neck: Normal range of motion. Neck supple.  Cardiovascular: Normal rate.   Respiratory: Effort normal.  GI: Soft. She exhibits no distension and no mass. There is tenderness (below umbilicus). There is no rebound and no guarding.  Genitourinary:  Genitourinary Comments: External: no lesions Vagina: rugated, parous, small bloody discharge Bimanual: no CMT, unable to palpate uterus and ovaries 2/2 body habitus  Musculoskeletal: Normal range of motion.  Neurological: She is alert and oriented to person, place, and time.  Skin: Skin is warm and dry.  Psychiatric: She has a normal mood and affect.   Results for orders placed or performed during the hospital encounter of 01/16/16 (from the past 24 hour(s))  Urinalysis, Routine w reflex microscopic (not at [facility])     Status: Abnormal   Collection Time: 01/16/16  3:52 PM  Result Value Ref Range   Color, Urine YELLOW YELLOW   APPearance HAZY (A) CLEAR   Specific Gravity, Urine 1.020 1.005 -  1.030   pH 6.5 5.0 - 8.0   Glucose, UA NEGATIVE NEGATIVE mg/dL   Hgb urine dipstick LARGE (A) NEGATIVE   Bilirubin Urine NEGATIVE NEGATIVE   Ketones, ur NEGATIVE NEGATIVE mg/dL   Protein, ur NEGATIVE NEGATIVE mg/dL   Nitrite NEGATIVE NEGATIVE   Leukocytes, UA NEGATIVE NEGATIVE  Urine microscopic-add on     Status: Abnormal   Collection Time: 01/16/16  3:52 PM  Result Value Ref Range   Squamous Epithelial / LPF 0-5 (A) NONE SEEN   WBC, UA NONE SEEN 0 - 5 WBC/hpf   RBC / HPF TOO NUMEROUS TO COUNT 0 - 5 RBC/hpf   Bacteria, UA NONE SEEN NONE SEEN   Urine-Other MUCOUS PRESENT   Pregnancy, urine POC     Status: None   Collection Time: 01/16/16  4:17 PM  Result Value Ref Range   Preg Test, Ur NEGATIVE NEGATIVE  CBC with Differential/Platelet     Status: Abnormal   Collection Time: 01/16/16  6:13 PM  Result Value Ref Range   WBC 12.6 (H) 4.0 - 10.5 K/uL   RBC 4.68 3.87 - 5.11 MIL/uL   Hemoglobin 9.7 (L) 12.0 - 15.0 g/dL   HCT 33.0 (L) 36.0 - 46.0 %   MCV 70.5 (L) 78.0 - 100.0 fL   MCH 20.7 (L) 26.0 - 34.0 pg   MCHC 29.4 (L) 30.0 - 36.0 g/dL   RDW 23.5 (H) 11.5 - 15.5 %   Platelets 381 150 - 400 K/uL   Neutrophils Relative % 82 %   Neutro Abs 10.3 (H) 1.7 - 7.7 K/uL   Lymphocytes Relative 14 %   Lymphs Abs 1.7 0.7 - 4.0 K/uL   Monocytes Relative 3 %   Monocytes Absolute 0.4 0.1 - 1.0 K/uL   Eosinophils Relative 1 %   Eosinophils Absolute 0.1 0.0 - 0.7 K/uL   Basophils Relative 0 %   Basophils Absolute 0.0 0.0 - 0.1 K/uL  Wet prep, genital     Status: Abnormal  Collection Time: 01/16/16  6:50 PM  Result Value Ref Range   Yeast Wet Prep HPF POC NONE SEEN NONE SEEN   Trich, Wet Prep NONE SEEN NONE SEEN   Clue Cells Wet Prep HPF POC PRESENT (A) NONE SEEN   WBC, Wet Prep HPF POC FEW (A) NONE SEEN   Sperm NONE SEEN     US Transvaginal Non-ob  Result Date: 01/16/2016 CLINICAL DATA:  Pelvic pain, severe abdominal pain EXAM: TRANSABDOMINAL AND TRANSVAGINAL ULTRASOUND OF PELVIS  DOPPLER ULTRASOUND OF OVARIES TECHNIQUE: Both transabdominal and transvaginal ultrasound examinations of the pelvis were performed. Transabdominal technique was performed for global imaging of the pelvis including uterus, ovaries, adnexal regions, and pelvic cul-de-sac. It was necessary to proceed with endovaginal exam following the transabdominal exam to visualize the uterus, endometrium and bilateral adnexal of. Color and duplex Doppler ultrasound was utilized to evaluate blood flow to the ovaries. COMPARISON:  CT abdomen pelvis 12/17/2015 FINDINGS: The examination is degraded by patient body habitus, limiting evaluation of the ovaries, which could only be visualized transabdominally. Uterus Measurements: 9.9 x 4.5 x 5.9 cm. No fibroids or other mass visualized. Endometrium Thickness: 1.2 mm.  No focal abnormality visualized. Right ovary Measurements: 3.6 x 2.5 x 2.7 cm. There is a right paraovarian cyst measuring 1.4 x 1.1 x 1.4 cm. Left ovary Measurements: 3.4 x 2.0 x 2.3 cm. Normal appearance/no adnexal mass. Pulsed Doppler evaluation of both ovaries demonstrates normal low-resistance arterial and venous waveforms. Other findings No abnormal free fluid. IMPRESSION: No acute abnormality of the pelvis. Electronically Signed   By: Ulyses Jarred M.D.   On: 01/16/2016 20:46   US Pelvis Complete  Result Date: 01/16/2016 CLINICAL DATA:  Pelvic pain, severe abdominal pain EXAM: TRANSABDOMINAL AND TRANSVAGINAL ULTRASOUND OF PELVIS DOPPLER ULTRASOUND OF OVARIES TECHNIQUE: Both transabdominal and transvaginal ultrasound examinations of the pelvis were performed. Transabdominal technique was performed for global imaging of the pelvis including uterus, ovaries, adnexal regions, and pelvic cul-de-sac. It was necessary to proceed with endovaginal exam following the transabdominal exam to visualize the uterus, endometrium and bilateral adnexal of. Color and duplex Doppler ultrasound was utilized to evaluate blood flow to  the ovaries. COMPARISON:  CT abdomen pelvis 12/17/2015 FINDINGS: The examination is degraded by patient body habitus, limiting evaluation of the ovaries, which could only be visualized transabdominally. Uterus Measurements: 9.9 x 4.5 x 5.9 cm. No fibroids or other mass visualized. Endometrium Thickness: 1.2 mm.  No focal abnormality visualized. Right ovary Measurements: 3.6 x 2.5 x 2.7 cm. There is a right paraovarian cyst measuring 1.4 x 1.1 x 1.4 cm. Left ovary Measurements: 3.4 x 2.0 x 2.3 cm. Normal appearance/no adnexal mass. Pulsed Doppler evaluation of both ovaries demonstrates normal low-resistance arterial and venous waveforms. Other findings No abnormal free fluid. IMPRESSION: No acute abnormality of the pelvis. Electronically Signed   By: Ulyses Jarred M.D.   On: 01/16/2016 20:46   Ct Abdomen Pelvis W Contrast  Result Date: 01/17/2016 CLINICAL DATA:  Severe abdominal pain, hematuria, nausea and fever that started 3 days ago. EXAM: CT ABDOMEN AND PELVIS WITH CONTRAST TECHNIQUE: Multidetector CT imaging of the abdomen and pelvis was performed using the standard protocol following bolus administration of intravenous contrast. CONTRAST:  19mL ISOVUE-300 IOPAMIDOL (ISOVUE-300) INJECTION 61%, 31mL ISOVUE-300 IOPAMIDOL (ISOVUE-300) INJECTION 61% COMPARISON:  December 17, 2015 FINDINGS: Lower chest: Bibasilar subsegmental atelectasis. No pneumonic consolidation, effusion or pneumothorax. Normal sized heart. Small hiatal hernia. Hepatobiliary: Hepatic steatosis. Contracted gallbladder without stones. No biliary dilatation. Pancreas: Unremarkable.  No pancreatic ductal dilatation or surrounding inflammatory changes. Spleen: Normal in size without focal abnormality. Adrenals/Urinary Tract: Tiny anterior mural calcification is noted on the left. Adjacent phlebolith posterior laterally on the right. No hydroureteronephrosis. Mild ectasia of the collecting systems. Stomach/Bowel: Moderate colonic stool burden  involving the ascending through transverse colon. No bowel obstruction. No acute inflammation. Vascular/Lymphatic: No significant vascular findings are present. No enlarged abdominal or pelvic lymph nodes. Reproductive: Uterus and bilateral adnexa are unremarkable. Other: No abdominal wall hernia. Trace subcutaneous induration/edema subcutaneous fat overlying the muscle is. No abdominopelvic ascites. Musculoskeletal: Slight disc space narrowing noted at L4-5. IMPRESSION: No acute intra-abdominal or pelvic abnormality. Hepatic steatosis. No significant change from previous exam. Electronically Signed   By: Ashley Royalty M.D.   On: 01/17/2016 00:00   [place] Art/ven Flow Abd Pelv Doppler  Result Date: 01/16/2016 CLINICAL DATA:  Pelvic pain, severe abdominal pain EXAM: TRANSABDOMINAL AND TRANSVAGINAL ULTRASOUND OF PELVIS DOPPLER ULTRASOUND OF OVARIES TECHNIQUE: Both transabdominal and transvaginal ultrasound examinations of the pelvis were performed. Transabdominal technique was performed for global imaging of the pelvis including uterus, ovaries, adnexal regions, and pelvic cul-de-sac. It was necessary to proceed with endovaginal exam following the transabdominal exam to visualize the uterus, endometrium and bilateral adnexal of. Color and duplex Doppler ultrasound was utilized to evaluate blood flow to the ovaries. COMPARISON:  CT abdomen pelvis 12/17/2015 FINDINGS: The examination is degraded by patient body habitus, limiting evaluation of the ovaries, which could only be visualized transabdominally. Uterus Measurements: 9.9 x 4.5 x 5.9 cm. No fibroids or other mass visualized. Endometrium Thickness: 1.2 mm.  No focal abnormality visualized. Right ovary Measurements: 3.6 x 2.5 x 2.7 cm. There is a right paraovarian cyst measuring 1.4 x 1.1 x 1.4 cm. Left ovary Measurements: 3.4 x 2.0 x 2.3 cm. Normal appearance/no adnexal mass. Pulsed Doppler evaluation of both ovaries demonstrates normal low-resistance arterial and  venous waveforms. Other findings No abnormal free fluid. IMPRESSION: No acute abnormality of the pelvis. Electronically Signed   By: Ulyses Jarred M.D.   On: 01/16/2016 20:46   MAU Course  Procedures Toradol 30 mg IM x1 Flagyl 500 mg po x1 Rocephin 2g IV x1  MDM Labs and [place] ordered and reviewed. Presentation and clinical findings with Dr. Mancel Bale. Plan for CT, Rocephin, and Flagyl. Report and transfer of care given to [place], [place]. Julianne Handler, CNM 01/16/16 9:03 PM   Assessment and Plan   1. Acute cystitis with hematuria   2. Pelvic pain in female   3. Abdominal pain    DC home Comfort measures reviewed  UC/GC/CT pending  RX: keflex QID x 7 days  Return to MAU as needed FU with OB as planned  [facility] .   Specialty:  Obstetrics and Gynecology Contact information: [address] [place] [phone]

## 2016-01-16 NOTE — MAU Note (Signed)
Pt states she is having severe abdominal pain, peeing blood clots, nausea and fever that started three days ago.  Pt states she took Motrin for her fever about 2 hours ago.

## 2016-01-17 DIAGNOSIS — N3001 Acute cystitis with hematuria: Secondary | ICD-10-CM

## 2016-01-17 LAB — GC/CHLAMYDIA PROBE AMP (~~LOC~~) NOT AT ARMC
CHLAMYDIA, DNA PROBE: NEGATIVE
Neisseria Gonorrhea: NEGATIVE

## 2016-01-17 MED ORDER — CEPHALEXIN 500 MG PO CAPS: 500.0000 mg | ORAL_CAPSULE | Freq: Four times a day (QID) | ORAL | 0 refills | Status: DC

## 2016-01-17 MED ORDER — FLUCONAZOLE 150 MG PO TABS: 150.0000 mg | ORAL_TABLET | Freq: Once | ORAL | 0 refills | Status: AC

## 2016-01-17 NOTE — Discharge Instructions (Signed)

## 2016-01-18 LAB — URINE CULTURE

## 2016-03-13 ENCOUNTER — Other Ambulatory Visit: Payer: Self-pay | Admitting: Adult Health

## 2016-03-13 MED ORDER — PANTOPRAZOLE SODIUM 40 MG PO TBEC: DELAYED_RELEASE_TABLET | ORAL | 0 refills | Status: DC

## 2016-04-04 ENCOUNTER — Other Ambulatory Visit: Payer: Self-pay | Admitting: Pulmonary Disease

## 2016-04-05 ENCOUNTER — Ambulatory Visit (HOSPITAL_COMMUNITY): Payer: Self-pay | Admitting: Psychiatry

## 2016-04-12 ENCOUNTER — Other Ambulatory Visit: Payer: Self-pay | Admitting: Adult Health

## 2016-04-20 ENCOUNTER — Encounter (HOSPITAL_COMMUNITY): Payer: Self-pay | Admitting: Psychiatry

## 2016-04-20 ENCOUNTER — Ambulatory Visit (HOSPITAL_COMMUNITY): Payer: Self-pay | Admitting: Psychiatry

## 2016-04-20 ENCOUNTER — Ambulatory Visit (INDEPENDENT_AMBULATORY_CARE_PROVIDER_SITE_OTHER): Payer: Medicare Other | Admitting: Psychiatry

## 2016-04-20 VITALS — BP 128/72 | HR 116 | Resp 16 | Ht 62.0 in | Wt 278.0 lb

## 2016-04-20 DIAGNOSIS — Z8249 Family history of ischemic heart disease and other diseases of the circulatory system: Secondary | ICD-10-CM

## 2016-04-20 DIAGNOSIS — F0630 Mood disorder due to known physiological condition, unspecified: Secondary | ICD-10-CM

## 2016-04-20 DIAGNOSIS — Z79899 Other long term (current) drug therapy: Secondary | ICD-10-CM

## 2016-04-20 DIAGNOSIS — R110 Nausea: Secondary | ICD-10-CM | POA: Diagnosis not present

## 2016-04-20 DIAGNOSIS — Z888 Allergy status to other drugs, medicaments and biological substances status: Secondary | ICD-10-CM

## 2016-04-20 DIAGNOSIS — F331 Major depressive disorder, recurrent, moderate: Secondary | ICD-10-CM | POA: Diagnosis not present

## 2016-04-20 DIAGNOSIS — Z8489 Family history of other specified conditions: Secondary | ICD-10-CM

## 2016-04-20 DIAGNOSIS — F410 Panic disorder [episodic paroxysmal anxiety] without agoraphobia: Secondary | ICD-10-CM | POA: Diagnosis not present

## 2016-04-20 DIAGNOSIS — Z91040 Latex allergy status: Secondary | ICD-10-CM

## 2016-04-20 DIAGNOSIS — Z818 Family history of other mental and behavioral disorders: Secondary | ICD-10-CM

## 2016-04-20 MED ORDER — FLUOXETINE HCL 20 MG PO CAPS: 20.0000 mg | ORAL_CAPSULE | Freq: Every day | ORAL | 2 refills | Status: DC

## 2016-04-20 MED ORDER — ARIPIPRAZOLE 2 MG PO TABS: 2.0000 mg | ORAL_TABLET | Freq: Every day | ORAL | 2 refills | Status: DC

## 2016-04-20 MED ORDER — CLONAZEPAM 0.5 MG PO TABS: 0.5000 mg | ORAL_TABLET | Freq: Two times a day (BID) | ORAL | 2 refills | Status: DC | PRN

## 2016-04-20 NOTE — Progress Notes (Signed)
Patient ID: Cheyenne Arias, female   DOB: 1976-01-31, 40 y.o.   MRN: [id number]  [facility] Follow up outpatient visit   Cheyenne Arias [id number] 41 y.o.  04/20/2016 9:27 AM  Chief Complaint:  Anxiety, depression. Follow up History of Present Illness:   Patient Presents for follow-up and medication management for major depressive disorder. Panic disorder. PTSD.  Patient is a single African-American female diagnosed with multiple medical conditions. She has 3 kids 2 of which are living with her 20 year old daughter and a 96-year-old son.   As per initial evaluation " Patient has been diagnosed with depression and says possible bipolar . Says that she is on Abilify 2 mg for night terrors and depression. She is also on Klonopin 0.5 mg up to 2 times a day.   Patient continues to have some dysphoric days she has not been seen in clinic for 7 months because of one or 2 no-shows. Says that she is running low on medication has to spread it also she's been feeling down Son is in prison he got ADHD instead of 57 years 71-year-old son has autism so he is struggling and she is making appointments with him [place] mother died over the holidays.  Night terrors: takes abilify it helps otherwise she screams and wakes up. It also helps mood symtpoms.. Medical complexity/Data: has OSA uses cpap. Which helps her sleep. Has fibromyalgia which complicates her depression. She continues with topamax and lyrica.  Severity of depression : 5/10  Aggravating factors: [name] Medical conditions. son in prison for armed robbery also include her medical conditions , fibromyalgia and her pain condition includes chronic back and neck pain. She also has a possibility of pituitary tumor that is being assessed  Modifying factors; some support and home health care  Prior Suicide Attempts: No  Medical History; Past Medical History:  Diagnosis Date  . Allergy    seasonal  . Anemia    goes to [facility], [facility]  .  Anxiety   . Arthritis   . Asthma   . Cancer [facility])    Cervical cancer  . Depression   . Fatty liver disease, nonalcoholic   . Fibromyalgia   . Fibromyalgia   . Food poisoning   . GERD (gastroesophageal reflux disease)   . 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 Hypertension   . Lumbar radiculopathy   . Migraines   . Neuropathy ([place])   . Obesity   . OSA (obstructive sleep apnea)   . OSA on CPAP   . Peripheral neuropathy ([place])   . Shortness of breath dyspnea   . Spinal stenosis     Allergies: Allergies  Allergen Reactions  . Cymbalta [Duloxetine Hcl] Other (See Comments)    Head felt cold and tight, loss of conciousness  . Latex Itching, Swelling and Other (See Comments)    Reaction:  Blisters   . Morphine And Related Itching    Medications: Outpatient Encounter Prescriptions as of 04/20/2016  Medication Sig  . amLODipine (NORVASC) 10 MG tablet Take 10 mg by mouth daily.  . ARIPiprazole (ABILIFY) 2 MG tablet Take 1 tablet (2 mg total) by mouth daily.  . budesonide (PULMICORT) 0.5 MG/2ML nebulizer solution Take 2 mLs (0.5 mg total) by nebulization 2 (two) times daily. Dx Code: Chronic Asthma J45.909  . cephALEXin (KEFLEX) 500 MG capsule Take 1 capsule (500 mg total) by mouth 4 (four) times daily.  . clonazePAM (KLONOPIN) 0.5 MG tablet Take 1 tablet (  0.5 mg total) by mouth 2 (two) times daily as needed for anxiety.  . cyclobenzaprine (FLEXERIL) 10 MG tablet Take 10 mg by mouth 3 (three) times daily as needed for muscle spasms.   . ferumoxytol (FERAHEME) 510 MG/17ML SOLN injection Inject 510 mg into the vein every 3 (three) months.  [name] FLUoxetine (PROZAC) 20 MG capsule Take 1 capsule (20 mg total) by mouth daily.  . fluticasone (FLONASE) 50 MCG/ACT nasal spray PLACE 2 SPRAYS INTO BOTH NOSTRILS 2 (TWO) TIMES DAILY.  . furosemide (LASIX) 80 MG tablet Take 80 mg by mouth 2 (two) times daily.  [name] ipratropium-albuterol (DUONEB) 0.5-2.5 (3) MG/3ML SOLN Take 3 mLs by nebulization  every 4 (four) hours as needed (for wheezing/shortness of breath).  . lactulose (CHRONULAC) 10 GM/15ML solution Take 10 g by mouth 3 (three) times daily as needed for mild constipation.  [name] loratadine (CLARITIN) 10 MG tablet TAKE 1 TABLET BY MOUTH EVERY DAY  . metoprolol tartrate (LOPRESSOR) 25 MG tablet Take 25 mg by mouth every morning.   . montelukast (SINGULAIR) 10 MG tablet Take 1 tablet (10 mg total) by mouth at bedtime.  . Multiple Vitamins-Minerals (MULTIVITAMIN WITH MINERALS) tablet Take 1 tablet by mouth daily.  [name] oxyCODONE (ROXICODONE) 15 MG immediate release tablet Take 15 mg by mouth every 6 (six) hours.  . pantoprazole (PROTONIX) 40 MG tablet TAKE 1 TABLET (40 MG TOTAL) BY MOUTH DAILY.  [name] potassium chloride SA (K-DUR,KLOR-CON) 20 MEQ tablet Take 20 mEq by mouth 2 (two) times daily.  . pregabalin (LYRICA) 75 MG capsule Take 225 mg by mouth 2 (two) times daily. Takes 150 mg and 75 mg to equal 225 mg twice daily.  [name] PROAIR HFA 108 (90 Base) MCG/ACT inhaler INHALE 2 PUFFS INTO THE LUNGS EVERY 6 (SIX) HOURS AS NEEDED FOR WHEEZING OR SHORTNESS OF BREATH.  . promethazine (PHENERGAN) 25 MG tablet Take 25 mg by mouth every 6 (six) hours as needed for nausea or vomiting.  . rizatriptan (MAXALT) 10 MG tablet Take 10 mg by mouth as needed for migraine. May repeat in 2 hours if needed  . topiramate (TOPAMAX) 100 MG tablet Take 100 mg by mouth 2 (two) times daily.  . [DISCONTINUED] ARIPiprazole (ABILIFY) 2 MG tablet Take 1 tablet (2 mg total) by mouth daily.  . [DISCONTINUED] clonazePAM (KLONOPIN) 0.5 MG tablet Take 1 tablet (0.5 mg total) by mouth 2 (two) times daily as needed for anxiety.  . [DISCONTINUED] FLUoxetine (PROZAC) 20 MG capsule Take 1 capsule (20 mg total) by mouth daily.  . [DISCONTINUED] fluticasone (FLONASE) 50 MCG/ACT nasal spray Place 2 sprays into both nostrils 2 (two) times daily.  . [DISCONTINUED] loratadine (CLARITIN) 10 MG tablet TAKE 1 TABLET BY MOUTH AT BEDTIME (Patient not  taking: Reported on 04/20/2016)   No facility-administered encounter medications on file as of 04/20/2016.      Family History; Family History  Problem Relation Age of Onset  . Hypertension Mother   . Sickle cell trait Maternal Aunt   . Sickle cell anemia Other   . Schizophrenia Son   . Depression Daughter        Labs:  No results found for this or any previous visit (from the past 2160 hour(s)).      Mental Status Examination;   Psychiatric Specialty Exam: Physical Exam  Constitutional: She appears well-nourished.  Skin: She is not diaphoretic.    Review of Systems  Constitutional: Positive for malaise/fatigue. Negative for fever.  Cardiovascular: Negative for chest pain.  Gastrointestinal: Negative for nausea and vomiting.  Skin: Negative for rash.  Psychiatric/Behavioral: Positive for depression. Negative for substance abuse and suicidal ideas.    Blood pressure 128/72, pulse (!) 116, resp. rate 16, height 5\' 2"  (1.575 m), weight 278 lb (126.1 kg), SpO2 98 %.Body mass index is 50.85 kg/m.  General Appearance: Casual  Eye Contact::  Fair  Speech:  Slow  Volume:  Normal  Mood:  dyshphoric at times.  Affect:  Congruent  Thought Process:  Coherent  Orientation:  Full (Time, Place, and Person)  Thought Content:  Rumination  Suicidal Thoughts:  No  Homicidal Thoughts:  No  Memory:  Immediate;   Fair Recent;   Fair  Judgement:  Fair  Insight:  Fair  Psychomotor Activity:  Normal  Concentration:  Fair  Recall:  Fair  Akathisia:  Negative  Handed:  Right  AIMS (if indicated):     Assets:  Communication Skills Desire for Improvement Financial Resources/Insurance Housing  Sleep:        Assessment: Axis I: Major depressive disorder recurrent moderate. Panic disorder. Mood disorder otherwise nonspecified. Possible related  secondary to general medical condition including chronic iback pain  Axis II: Deferred   Axis III:  Past Medical History:   Diagnosis Date  . Allergy    seasonal  . Anemia    goes to [facility], [facility]  . Anxiety   . Arthritis   . Asthma   . Cancer [facility])    Cervical cancer  . Depression   . Fatty liver disease, nonalcoholic   . Fibromyalgia   . Fibromyalgia   . Food poisoning   . GERD (gastroesophageal reflux disease)   . 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 Hypertension   . Lumbar radiculopathy   . Migraines   . Neuropathy ([place])   . Obesity   . OSA (obstructive sleep apnea)   . OSA on CPAP   . Peripheral neuropathy ([place])   . Shortness of breath dyspnea   . Spinal stenosis     Axis IV: Psychosocial, single mom, multiple medical conditions.   Treatment Plan and Summary:  Mood disorder: continue abilify not increase but take regular. No tremors. 2mg . Sent Depressionand anxiety: continue prozac take regularly Panic disorder; continue klonopine. Without which she feels palpitations. Do not increase Says when she takes meds regularly it does help.  Does not want to change or increase doses.  Refer to sleep provider in regard to insomnia as her case is complicated with OSA. She also takes klonopine. Reviewed sleep hygiene Pertinent Labs and Relevant Prior Notes reviewed. Medication Side effects, benefits and risks reviewed/discussed with Patient. Time given for patient to respond and asks questions regarding the Diagnosis and Medications. Safety concerns and to report to ER if suicidal or call 911.  Discussed weight maintenance.  FU 2-3 months  Merian Capron, MD 04/20/2016

## 2016-04-30 ENCOUNTER — Encounter (HOSPITAL_COMMUNITY): Payer: Self-pay | Admitting: *Deleted

## 2016-04-30 ENCOUNTER — Inpatient Hospital Stay (HOSPITAL_COMMUNITY)
Admission: AD | Admit: 2016-04-30 | Discharge: 2016-04-30 | Disposition: A | Discharge status: Home / Self Care | Payer: Medicare Other | Source: Ambulatory Visit | Attending: Obstetrics and Gynecology | Admitting: Obstetrics and Gynecology

## 2016-04-30 DIAGNOSIS — B9689 Other specified bacterial agents as the cause of diseases classified elsewhere: Secondary | ICD-10-CM | POA: Diagnosis not present

## 2016-04-30 DIAGNOSIS — N939 Abnormal uterine and vaginal bleeding, unspecified: Secondary | ICD-10-CM | POA: Insufficient documentation

## 2016-04-30 DIAGNOSIS — N926 Irregular menstruation, unspecified: Secondary | ICD-10-CM

## 2016-04-30 DIAGNOSIS — N760 Acute vaginitis: Secondary | ICD-10-CM | POA: Diagnosis not present

## 2016-04-30 DIAGNOSIS — Z3202 Encounter for pregnancy test, result negative: Secondary | ICD-10-CM | POA: Insufficient documentation

## 2016-04-30 LAB — URINALYSIS, ROUTINE W REFLEX MICROSCOPIC

## 2016-04-30 LAB — URINALYSIS, MICROSCOPIC (REFLEX)

## 2016-04-30 LAB — WET PREP, GENITAL
SPERM: NONE SEEN
TRICH WET PREP: NONE SEEN
Yeast Wet Prep HPF POC: NONE SEEN

## 2016-04-30 LAB — POCT PREGNANCY, URINE: PREG TEST UR: NEGATIVE

## 2016-04-30 MED ORDER — METRONIDAZOLE 500 MG PO TABS: 500.0000 mg | ORAL_TABLET | Freq: Two times a day (BID) | ORAL | 0 refills | Status: DC

## 2016-04-30 NOTE — MAU Note (Addendum)
Pt states her period started on Thursday, noted "black blood, looked old & dried."  Had foul odor.  Blood looks red with wiping, but is black on the pad.  Denies pain.  Period is much lighter than usual.

## 2016-04-30 NOTE — MAU Provider Note (Signed)
History     CSN: [id number]  Arrival date and time: 04/30/16 [id number]   First Provider Initiated Contact with Patient 04/30/16 [phone]      Chief Complaint  Patient presents with  . unusual bleeding   HPI  Cheyenne Arias is a 41 y.o. female G3P3003 here in MAU with abnormal vaginal bleeding. She started her menstrual cycle on 1/1. She describes a change in the color and odor of the bleeding. The color is described as "black blood with a terrible odor". The first day of the cycle was some clotting, however the clotting was also black. The odor was similar to "garbage".  Her bleeding is light at this time.   OB History    Gravida Para Term Preterm AB Living   3 3 3     3    SAB TAB Ectopic Multiple Live Births           3      Past Medical History:  Diagnosis Date  . Allergy    seasonal  . Anemia    goes to [facility], [place]  . Anxiety   . Arthritis   . Asthma   . Cancer [facility])    Cervical cancer  . Depression   . Fatty liver disease, nonalcoholic   . Fibromyalgia   . Fibromyalgia   . Food poisoning   . GERD (gastroesophageal reflux disease)   . 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 Hypertension   . Lumbar radiculopathy   . Migraines   . Neuropathy ([place])   . Obesity   . OSA (obstructive sleep apnea)   . OSA on CPAP   . Peripheral neuropathy ([place])   . Shortness of breath dyspnea   . Spinal stenosis     Past Surgical History:  Procedure Laterality Date  . CARPAL TUNNEL RELEASE    . [place]   . CESAREAN SECTION  11/28/2011   Procedure: CESAREAN SECTION;  Surgeon: Woodroe Mode, MD;  Location: [facility];  Service: Obstetrics;  Laterality: N/A;  . COLPOSCOPY    . HYSTEROSCOPY W/D&C N/A 08/17/2015   Procedure: DILATATION AND CURETTAGE /HYSTEROSCOPY;  Surgeon: Ena Dawley, MD;  Location: [facility];  Service: Gynecology;  Laterality: N/A;  . MANDIBLE SURGERY  2008  . WISDOM TOOTH EXTRACTION      Family History  Problem Relation Age of  Onset  . Hypertension Mother   . Sickle cell trait Maternal Aunt   . Sickle cell anemia Other   . Schizophrenia Son   . Depression Daughter     Social History  Substance Use Topics  . Smoking status: Never Smoker  . Smokeless tobacco: Never Used     Comment: Pt was exposed to 2nd hand smoke at work years ago.  . Alcohol use No    Allergies:  Allergies  Allergen Reactions  . Cymbalta [Duloxetine Hcl] Other (See Comments)    Head felt cold and tight, loss of conciousness  . Latex Itching, Swelling and Other (See Comments)    Reaction:  Blisters   . Morphine And Related Itching    Prescriptions Prior to Admission  Medication Sig Dispense Refill Last Dose  . amLODipine (NORVASC) 10 MG tablet Take 10 mg by mouth daily.   04/29/2016 at Unknown time  . ARIPiprazole (ABILIFY) 2 MG tablet Take 1 tablet (2 mg total) by mouth daily. 30 tablet 2 04/29/2016 at Unknown time  . budesonide (PULMICORT) 0.5  MG/2ML nebulizer solution Take 2 mLs (0.5 mg total) by nebulization 2 (two) times daily. Dx Code: Chronic Asthma J45.909 120 mL 5 04/29/2016 at Unknown time  . cyclobenzaprine (FLEXERIL) 10 MG tablet Take 10 mg by mouth 3 (three) times daily as needed for muscle spasms.    04/29/2016 at Unknown time  . FLUoxetine (PROZAC) 20 MG capsule Take 1 capsule (20 mg total) by mouth daily. 30 capsule 2 04/29/2016 at Unknown time  . fluticasone (FLONASE) 50 MCG/ACT nasal spray PLACE 2 SPRAYS INTO BOTH NOSTRILS 2 (TWO) TIMES DAILY. 16 g 5 04/29/2016 at Unknown time  . furosemide (LASIX) 80 MG tablet Take 80 mg by mouth 2 (two) times daily.   04/29/2016 at Unknown time  . loratadine (CLARITIN) 10 MG tablet TAKE 1 TABLET BY MOUTH EVERY DAY 30 tablet 2 04/29/2016 at Unknown time  . metoprolol tartrate (LOPRESSOR) 25 MG tablet Take 25 mg by mouth every morning.    04/29/2016 at Unknown time  . montelukast (SINGULAIR) 10 MG tablet Take 1 tablet (10 mg total) by mouth at bedtime. 30 tablet 3 04/29/2016 at Unknown time  .  Multiple Vitamins-Minerals (MULTIVITAMIN WITH MINERALS) tablet Take 1 tablet by mouth daily.   04/29/2016 at Unknown time  . oxyCODONE (ROXICODONE) 15 MG immediate release tablet Take 15 mg by mouth every 6 (six) hours as needed for pain.   04/29/2016 at Unknown time  . pantoprazole (PROTONIX) 40 MG tablet TAKE 1 TABLET (40 MG TOTAL) BY MOUTH DAILY. 90 tablet 0 04/29/2016 at Unknown time  . potassium chloride SA (K-DUR,KLOR-CON) 20 MEQ tablet Take 20 mEq by mouth 2 (two) times daily.   04/29/2016 at Unknown time  . pregabalin (LYRICA) 150 MG capsule Take 150 mg by mouth 2 (two) times daily. Takes with a 75mg  tab for total of 225mg .   04/29/2016 at Unknown time  . pregabalin (LYRICA) 75 MG capsule Take 225 mg by mouth 2 (two) times daily. Takes 150 mg and 75 mg to equal 225 mg twice daily.   04/29/2016 at Unknown time  . PROAIR HFA 108 (90 Base) MCG/ACT inhaler INHALE 2 PUFFS INTO THE LUNGS EVERY 6 (SIX) HOURS AS NEEDED FOR WHEEZING OR SHORTNESS OF BREATH. 8.5 Inhaler 0 Past Week at Unknown time  . rizatriptan (MAXALT) 10 MG tablet Take 10 mg by mouth as needed for migraine. May repeat in 2 hours if needed   Past Month at Unknown time  . topiramate (TOPAMAX) 100 MG tablet Take 100 mg by mouth 2 (two) times daily.   04/29/2016 at Unknown time  . cephALEXin (KEFLEX) 500 MG capsule Take 1 capsule (500 mg total) by mouth 4 (four) times daily. (Patient not taking: Reported on 04/30/2016) 28 capsule 0 Completed Course at Unknown time  . clonazePAM (KLONOPIN) 0.5 MG tablet Take 1 tablet (0.5 mg total) by mouth 2 (two) times daily as needed for anxiety. 60 tablet 2 04/28/2016  . ferumoxytol (FERAHEME) 510 MG/17ML SOLN injection Inject 510 mg into the vein every 3 (three) months. Pt could not remember when she got this med last, but thinks it was around three months ago.   Taking  . ipratropium-albuterol (DUONEB) 0.5-2.5 (3) MG/3ML SOLN Take 3 mLs by nebulization every 4 (four) hours as needed (for wheezing/shortness of  breath). 360 mL 5 04/27/2016   Results for orders placed or performed during the hospital encounter of 04/30/16 (from the past 48 hour(s))  Urinalysis, Routine w reflex microscopic     Status: Abnormal  Collection Time: 04/30/16  7:50 AM  Result Value Ref Range   Color, Urine RED (A) YELLOW    Comment: BIOCHEMICALS MAY BE AFFECTED BY COLOR   APPearance CLOUDY (A) CLEAR   Specific Gravity, Urine  1.005 - 1.030    TEST NOT REPORTED DUE TO COLOR INTERFERENCE OF URINE PIGMENT   pH  5.0 - 8.0    TEST NOT REPORTED DUE TO COLOR INTERFERENCE OF URINE PIGMENT   Glucose, UA (A) NEGATIVE mg/dL    TEST NOT REPORTED DUE TO COLOR INTERFERENCE OF URINE PIGMENT   Hgb urine dipstick (A) NEGATIVE    TEST NOT REPORTED DUE TO COLOR INTERFERENCE OF URINE PIGMENT   Bilirubin Urine (A) NEGATIVE    TEST NOT REPORTED DUE TO COLOR INTERFERENCE OF URINE PIGMENT   Ketones, ur (A) NEGATIVE mg/dL    TEST NOT REPORTED DUE TO COLOR INTERFERENCE OF URINE PIGMENT   Protein, ur (A) NEGATIVE mg/dL    TEST NOT REPORTED DUE TO COLOR INTERFERENCE OF URINE PIGMENT   Nitrite (A) NEGATIVE    TEST NOT REPORTED DUE TO COLOR INTERFERENCE OF URINE PIGMENT   Leukocytes, UA (A) NEGATIVE    TEST NOT REPORTED DUE TO COLOR INTERFERENCE OF URINE PIGMENT  Urinalysis, Microscopic (reflex)     Status: Abnormal   Collection Time: 04/30/16  7:50 AM  Result Value Ref Range   RBC / HPF TOO NUMEROUS TO COUNT 0 - 5 RBC/hpf   WBC, UA 0-5 0 - 5 WBC/hpf   Bacteria, UA MANY (A) NONE SEEN   Squamous Epithelial / LPF 6-30 (A) NONE SEEN   Urine-Other MUCOUS PRESENT   Pregnancy, urine POC     Status: None   Collection Time: 04/30/16  8:04 AM  Result Value Ref Range   Preg Test, Ur NEGATIVE NEGATIVE    Comment:        THE SENSITIVITY OF THIS METHODOLOGY IS >24 mIU/mL   Wet prep, genital     Status: Abnormal   Collection Time: 04/30/16  9:08 AM  Result Value Ref Range   Yeast Wet Prep HPF POC NONE SEEN NONE SEEN   Trich, Wet Prep NONE SEEN  NONE SEEN   Clue Cells Wet Prep HPF POC PRESENT (A) NONE SEEN   WBC, Wet Prep HPF POC FEW (A) NONE SEEN    Comment: MANY BACTERIA SEEN   Sperm NONE SEEN     Review of Systems  Constitutional: Negative for fever.  Gastrointestinal: Positive for nausea.  Neurological: Negative for dizziness.   Physical Exam   Blood pressure 121/55, pulse 112, temperature 97.9 F (36.6 C), temperature source Oral, resp. rate 18, height 5\' 2"  (1.575 m), weight 274 lb (124.3 kg), last menstrual period 04/26/2016, SpO2 100 %.  Physical Exam  Constitutional: She is oriented to person, place, and time. She appears well-developed and well-nourished. No distress.  HENT:  Head: Normocephalic.  Eyes: Pupils are equal, round, and reactive to light.  Neck: Neck supple.  GI: Soft.  Genitourinary:  Genitourinary Comments: Vagina - Small amount of dark red blood in the vaginal canal. No clots noted. Color is dark red.  Cervix -  Small active bleeding  Bimanual exam: Cervix closed Uterus non tender, normal size Adnexa non tender, no masses bilaterally Wet prep and GCC collected  Chaperone present for exam.   Musculoskeletal: Normal range of motion.  Neurological: She is alert and oriented to person, place, and time.  Skin: Skin is warm. She is not diaphoretic.  Psychiatric:  Her behavior is normal.    MAU Course  Procedures  None  MDM  GCC & Wet prep Patient is without dizziness at this time.   Assessment and Plan   A:  1. Abnormal menstrual cycle   2. BV (bacterial vaginosis)     P:  Discharge home in stable condition Rx: Flagyl Return to MAU for emergencies only Follow up with GYN as needed, if symptoms worsen    Lezlie Lye, NP 04/30/2016 4:23 PM

## 2016-04-30 NOTE — Discharge Instructions (Signed)

## 2016-05-01 LAB — GC/CHLAMYDIA PROBE AMP (~~LOC~~) NOT AT ARMC
CHLAMYDIA, DNA PROBE: NEGATIVE
Neisseria Gonorrhea: NEGATIVE

## 2016-05-10 ENCOUNTER — Other Ambulatory Visit: Payer: Self-pay | Admitting: Adult Health

## 2016-05-10 ENCOUNTER — Other Ambulatory Visit: Payer: Self-pay | Admitting: Pulmonary Disease

## 2016-05-10 MED ORDER — ALBUTEROL SULFATE HFA 108 (90 BASE) MCG/ACT IN AERS: 2.0000 | INHALATION_SPRAY | Freq: Four times a day (QID) | RESPIRATORY_TRACT | 0 refills | Status: DC | PRN

## 2016-05-10 MED ORDER — PANTOPRAZOLE SODIUM 40 MG PO TBEC
DELAYED_RELEASE_TABLET | ORAL | 0 refills | Status: DC
Start: 2016-05-10 — End: 2017-02-06

## 2016-05-28 ENCOUNTER — Ambulatory Visit (INDEPENDENT_AMBULATORY_CARE_PROVIDER_SITE_OTHER): Payer: Medicare Other | Admitting: Pulmonary Disease

## 2016-05-28 ENCOUNTER — Encounter: Payer: Self-pay | Admitting: Pulmonary Disease

## 2016-05-28 VITALS — BP 122/72 | HR 95 | Ht 62.0 in | Wt 277.0 lb

## 2016-05-28 DIAGNOSIS — J4540 Moderate persistent asthma, uncomplicated: Secondary | ICD-10-CM | POA: Diagnosis not present

## 2016-05-28 DIAGNOSIS — E662 Morbid (severe) obesity with alveolar hypoventilation: Secondary | ICD-10-CM | POA: Diagnosis not present

## 2016-05-28 DIAGNOSIS — J069 Acute upper respiratory infection, unspecified: Secondary | ICD-10-CM | POA: Diagnosis not present

## 2016-05-28 DIAGNOSIS — Z6841 Body Mass Index (BMI) 40.0 and over, adult: Secondary | ICD-10-CM

## 2016-05-28 DIAGNOSIS — G4733 Obstructive sleep apnea (adult) (pediatric): Secondary | ICD-10-CM

## 2016-05-28 DIAGNOSIS — J9611 Chronic respiratory failure with hypoxia: Secondary | ICD-10-CM | POA: Diagnosis not present

## 2016-05-28 MED ORDER — ALBUTEROL SULFATE HFA 108 (90 BASE) MCG/ACT IN AERS: 2.0000 | INHALATION_SPRAY | Freq: Four times a day (QID) | RESPIRATORY_TRACT | 2 refills | Status: DC | PRN

## 2016-05-28 NOTE — Patient Instructions (Signed)
Will arrange for new home nebulizer machine  Call if your symptoms get worse  Follow up in 6 months

## 2016-05-28 NOTE — Progress Notes (Signed)
Current Outpatient Prescriptions on File Prior to Visit  Medication Sig  . albuterol (PROAIR HFA) 108 (90 Base) MCG/ACT inhaler Inhale 2 puffs into the lungs every 6 (six) hours as needed for wheezing or shortness of breath.  [name] amLODipine (NORVASC) 10 MG tablet Take 10 mg by mouth daily.  . ARIPiprazole (ABILIFY) 2 MG tablet Take 1 tablet (2 mg total) by mouth daily.  . budesonide (PULMICORT) 0.5 MG/2ML nebulizer solution Take 2 mLs (0.5 mg total) by nebulization 2 (two) times daily. Dx Code: Chronic Asthma J45.909  . clonazePAM (KLONOPIN) 0.5 MG tablet Take 1 tablet (0.5 mg total) by mouth 2 (two) times daily as needed for anxiety.  . cyclobenzaprine (FLEXERIL) 10 MG tablet Take 10 mg by mouth 3 (three) times daily as needed for muscle spasms.   . ferumoxytol (FERAHEME) 510 MG/17ML SOLN injection Inject 510 mg into the vein every 3 (three) months. Pt could not remember when she got this med last, but thinks it was around three months ago.  [name] FLUoxetine (PROZAC) 20 MG capsule Take 1 capsule (20 mg total) by mouth daily.  . fluticasone (FLONASE) 50 MCG/ACT nasal spray PLACE 2 SPRAYS INTO BOTH NOSTRILS 2 (TWO) TIMES DAILY.  . furosemide (LASIX) 80 MG tablet Take 80 mg by mouth 2 (two) times daily.  [name] ipratropium-albuterol (DUONEB) 0.5-2.5 (3) MG/3ML SOLN Take 3 mLs by nebulization every 4 (four) hours as needed (for wheezing/shortness of breath).  . loratadine (CLARITIN) 10 MG tablet TAKE 1 TABLET BY MOUTH EVERY DAY  . metoprolol tartrate (LOPRESSOR) 25 MG tablet Take 25 mg by mouth every morning.   . metroNIDAZOLE (FLAGYL) 500 MG tablet Take 1 tablet (500 mg total) by mouth 2 (two) times daily.  . montelukast (SINGULAIR) 10 MG tablet Take 1 tablet (10 mg total) by mouth at bedtime.  . Multiple Vitamins-Minerals (MULTIVITAMIN WITH MINERALS) tablet Take 1 tablet by mouth daily.  [name] oxyCODONE (ROXICODONE) 15 MG immediate release tablet Take 15 mg by mouth every 6 (six) hours as needed for pain.  .  pantoprazole (PROTONIX) 40 MG tablet TAKE 1 TABLET (40 MG TOTAL) BY MOUTH DAILY.  [name] potassium chloride SA (K-DUR,KLOR-CON) 20 MEQ tablet Take 20 mEq by mouth 2 (two) times daily.  . pregabalin (LYRICA) 150 MG capsule Take 150 mg by mouth 2 (two) times daily. Takes with a 75mg  tab for total of 225mg .  . pregabalin (LYRICA) 75 MG capsule Take 225 mg by mouth 2 (two) times daily. Takes 150 mg and 75 mg to equal 225 mg twice daily.  . rizatriptan (MAXALT) 10 MG tablet Take 10 mg by mouth as needed for migraine. May repeat in 2 hours if needed  . topiramate (TOPAMAX) 100 MG tablet Take 100 mg by mouth 2 (two) times daily.  . [DISCONTINUED] zolpidem (AMBIEN) 5 MG tablet Take 5 mg by mouth at bedtime as needed for sleep.    No current facility-administered medications on file prior to visit.      Chief Complaint  Patient presents with  . Acute Visit    Pt c/o cough with green phlegm, SOB and chest congestion x 1 week. Pt seen at [facility] recently for similar symptoms plus GI bug.  Flu swab negative, Pt was given Tamiflu but did not take it. No recent CXR.  Uses Albuterol HFA and Nebs - pt needs new Rx for replacement neb machine. Pt takes Doxy 100mg  BID for skin condition.      Sleep tests PSG 03/27/11 >> AHI 10.9  Cardiac tests Echo 09/20/11 > >mod LVH, EF 65 to 70%, small/mod pericardial effusion  Doppler legs 09/21/11 >> no DVT  Cardiac tests CT chest 09/22/11 >> no PE, Lt base ATX/ASD, borderline cardiomegaly Spirometry 01/10/12 >> FEV1 1.72 (71%), FEV1% 77 CT sinus 02/28/12 >> clear paranasal sinuses RAST 02/28/12 >> IgE 15.6, moderate reaction to [facility] CT chest 04/03/12 >> ASD superior segment LLL, RLL ATX PFT 12/17/12 >> FEV1 2.14 (95%), FEV1% 88, TLC 2.62 (58%), DLCO 96%, no BD CPAP 05/15/15 to 06/13/15 >> used on 30 of 30 nights with average 6 hrs 55 min.  Average AHI 1 with CPAP 13 cm H2O  Past medical history HTN, HA, Anxiety, Fibromyalgia, Depression, Alpha thalassemia, Iron deficiency  anemia, Spinal stenosis  Past surgical history, Family history, Social history, Allergies reviewed  Vital Signs BP 122/72 (BP Location: Left Arm, Cuff Size: Normal)   Pulse 95   Ht 5\' 2"  (1.575 m)   Wt 277 lb (125.6 kg)   SpO2 99%   BMI 50.66 kg/m   History of Present Illness Cheyenne Arias is a 41 y.o. female with asthma, OSA, OHS, and chronic respiratory failure.  Her granddaughter visited about 1 week ago.  She then developed sinus congestion, headache, cough, wheeze, fever 102F, chest congestion, nausea, and diarrhea.  She went to urgent care and was told flu swab was negative.  She was given script for tamiflu, but didn't fill this.  She feels like she is slowly improving.  Her home nebulizer is broke, and as a result she has been using albuterol puffer on regular basis.  This helps, and she last used just prior to visit today.  She takes ibuprofen bid for spinal stenosis and fibromyalgia.  She is on chronic doxycycline for skin condition.  Physical Exam  General - pleasant ENT - no sinus tenderness, clear nasal discharge, MP 2, 2+ tonsils, no oral exudate, no LAN Cardiac - regular, no murmur Chest - no wheeze, rales Back - no tenderness Abd - soft, non tender Ext - no edema Neuro - normal strength Skin - no rashes Psych - normal mood   Assessment/Plan  Acute viral upper respiratory infection. - she is slowly improving - discussed symptoms to monitor for that would indicate bacterial superinfection and need for Abx  Persistent asthma. - continue pulmicort, duoneb, singulair - will arrange for new home nebulizer  Obstructive sleep apnea. - continue CPAP 13 cm H2O  Chronic hypoxic respiratory failure 2nd to obesity hypoventilation syndrome. - continue 2 liters oxygen with CPAP at night  Morbid obesity. - reviewed options to assist with weight loss   Patient Instructions  Will arrange for new home nebulizer machine  Call if your symptoms get worse  Follow  up in 6 months   Chesley Mires, MD [place] [facility] Pager:  [phone] 05/28/2016, 2:34 PM

## 2016-05-28 NOTE — Addendum Note (Signed)
Addended by: Virl Cagey on: 05/28/2016 04:42 PM   Modules accepted: Orders

## 2016-06-04 ENCOUNTER — Other Ambulatory Visit (HOSPITAL_COMMUNITY): Payer: Self-pay | Admitting: Psychiatry

## 2016-06-04 DIAGNOSIS — R110 Nausea: Secondary | ICD-10-CM

## 2016-06-06 NOTE — Telephone Encounter (Signed)
Received medication request from [facility] requesting a refill for Prozac. Per Dr. De Nurse, refill request are denied. Refill was sent to pharmacy on 04/20/16 with 2 additional refills. Pt's next apt is schedule on 07/10/16. Nothing further is needed at this time.

## 2016-06-07 ENCOUNTER — Other Ambulatory Visit: Payer: Self-pay | Admitting: Adult Health

## 2016-06-07 ENCOUNTER — Other Ambulatory Visit: Payer: Self-pay | Admitting: Pulmonary Disease

## 2016-06-07 DIAGNOSIS — N390 Urinary tract infection, site not specified: Secondary | ICD-10-CM

## 2016-06-07 DIAGNOSIS — D259 Leiomyoma of uterus, unspecified: Secondary | ICD-10-CM

## 2016-06-07 DIAGNOSIS — R110 Nausea: Secondary | ICD-10-CM

## 2016-07-10 ENCOUNTER — Ambulatory Visit (HOSPITAL_COMMUNITY): Payer: Self-pay | Admitting: Psychiatry

## 2016-07-19 DIAGNOSIS — Z9889 Other specified postprocedural states: Secondary | ICD-10-CM | POA: Insufficient documentation

## 2016-08-06 ENCOUNTER — Inpatient Hospital Stay (HOSPITAL_COMMUNITY): Payer: Medicare Other

## 2016-08-06 ENCOUNTER — Inpatient Hospital Stay (HOSPITAL_COMMUNITY)
Admission: AD | Admit: 2016-08-06 | Discharge: 2016-08-06 | Disposition: A | Discharge status: Home / Self Care | Payer: Medicare Other | Source: Ambulatory Visit | Attending: Obstetrics and Gynecology | Admitting: Obstetrics and Gynecology

## 2016-08-06 ENCOUNTER — Encounter (HOSPITAL_COMMUNITY): Payer: Self-pay

## 2016-08-06 DIAGNOSIS — N760 Acute vaginitis: Secondary | ICD-10-CM | POA: Diagnosis not present

## 2016-08-06 DIAGNOSIS — Z6841 Body Mass Index (BMI) 40.0 and over, adult: Secondary | ICD-10-CM | POA: Diagnosis not present

## 2016-08-06 DIAGNOSIS — K219 Gastro-esophageal reflux disease without esophagitis: Secondary | ICD-10-CM | POA: Diagnosis not present

## 2016-08-06 DIAGNOSIS — I10 Essential (primary) hypertension: Secondary | ICD-10-CM | POA: Diagnosis not present

## 2016-08-06 DIAGNOSIS — E876 Hypokalemia: Secondary | ICD-10-CM | POA: Insufficient documentation

## 2016-08-06 DIAGNOSIS — Z8541 Personal history of malignant neoplasm of cervix uteri: Secondary | ICD-10-CM | POA: Insufficient documentation

## 2016-08-06 DIAGNOSIS — K760 Fatty (change of) liver, not elsewhere classified: Secondary | ICD-10-CM | POA: Diagnosis not present

## 2016-08-06 DIAGNOSIS — E669 Obesity, unspecified: Secondary | ICD-10-CM | POA: Diagnosis not present

## 2016-08-06 DIAGNOSIS — Z9889 Other specified postprocedural states: Secondary | ICD-10-CM | POA: Insufficient documentation

## 2016-08-06 DIAGNOSIS — R102 Pelvic and perineal pain: Secondary | ICD-10-CM

## 2016-08-06 DIAGNOSIS — Z79899 Other long term (current) drug therapy: Secondary | ICD-10-CM | POA: Diagnosis not present

## 2016-08-06 LAB — URINALYSIS, ROUTINE W REFLEX MICROSCOPIC
Bilirubin Urine: NEGATIVE
GLUCOSE, UA: NEGATIVE mg/dL
Hgb urine dipstick: NEGATIVE
Ketones, ur: NEGATIVE mg/dL
LEUKOCYTES UA: NEGATIVE
NITRITE: NEGATIVE
PH: 7 (ref 5.0–8.0)
Protein, ur: NEGATIVE mg/dL
SPECIFIC GRAVITY, URINE: 1.01 (ref 1.005–1.030)

## 2016-08-06 LAB — COMPREHENSIVE METABOLIC PANEL
ALK PHOS: 72 U/L (ref 38–126)
ALT: 29 U/L (ref 14–54)
AST: 31 U/L (ref 15–41)
Albumin: 3.9 g/dL (ref 3.5–5.0)
Anion gap: 10 (ref 5–15)
BUN: 10 mg/dL (ref 6–20)
CALCIUM: 8.9 mg/dL (ref 8.9–10.3)
CHLORIDE: 107 mmol/L (ref 101–111)
CO2: 21 mmol/L — ABNORMAL LOW (ref 22–32)
CREATININE: 0.75 mg/dL (ref 0.44–1.00)
GFR calc Af Amer: >60 mL/min (ref >60)
Glucose, Bld: 166 mg/dL — ABNORMAL HIGH (ref 65–99)
Potassium: 2.7 mmol/L — CL (ref 3.5–5.1)
Sodium: 138 mmol/L (ref 135–145)
Total Bilirubin: 0.4 mg/dL (ref 0.3–1.2)
Total Protein: 8 g/dL (ref 6.5–8.1)

## 2016-08-06 LAB — CBC
HEMATOCRIT: 34.1 % — AB (ref 36.0–46.0)
HEMOGLOBIN: 10.4 g/dL — AB (ref 12.0–15.0)
MCH: 20.8 pg — AB (ref 26.0–34.0)
MCHC: 30.5 g/dL (ref 30.0–36.0)
MCV: 68.2 fL — ABNORMAL LOW (ref 78.0–100.0)
Platelets: 392 10*3/uL (ref 150–400)
RBC: 5 MIL/uL (ref 3.87–5.11)
RDW: 17.4 % — ABNORMAL HIGH (ref 11.5–15.5)
WBC: 18.5 10*3/uL — ABNORMAL HIGH (ref 4.0–10.5)

## 2016-08-06 LAB — LIPASE, BLOOD: Lipase: 23 U/L (ref 11–51)

## 2016-08-06 LAB — POCT PREGNANCY, URINE: Preg Test, Ur: NEGATIVE

## 2016-08-06 LAB — AMYLASE: AMYLASE: 40 U/L (ref 28–100)

## 2016-08-06 MED ORDER — KETOROLAC TROMETHAMINE 60 MG/2ML IM SOLN
60.0000 mg | Freq: Once | INTRAMUSCULAR | Status: AC
  Administered 2016-08-06: 60 mg via INTRAMUSCULAR
  Filled 2016-08-06: qty 2

## 2016-08-06 MED ORDER — POTASSIUM CHLORIDE CRYS ER 20 MEQ PO TBCR
40.0000 meq | EXTENDED_RELEASE_TABLET | Freq: Once | ORAL | Status: AC
  Administered 2016-08-06: 40 meq via ORAL
  Filled 2016-08-06: qty 2

## 2016-08-06 MED ORDER — POTASSIUM CHLORIDE CRYS ER 20 MEQ PO TBCR: 20.0000 meq | EXTENDED_RELEASE_TABLET | Freq: Every day | ORAL | 0 refills | Status: DC

## 2016-08-06 NOTE — MAU Note (Addendum)
PT SAYS  HER LOWER  RIGHT  ABD  HURTS -  -   AND  HURTS   TO WALK   .    STARTED   AT  2 PM-     TOOK  IBUPROFEN 800MG   1 TAB  AT 2 PM ,   FLEXERIL 10MG    , AND OXYCODONE  1 TAB .   SHE  DID NOT  CALL OFFICE -   TOOK SON TO CLASS AND RAN       ERR[facility]S .   AT 6PM- PUT HOTWATER BOTTLE  ON ABD  .      LAST TIME   HAPPENED AGAIN  IN   01-2016        NO   BIRTH CONTROL.    LAST SEX-   SAT

## 2016-08-06 NOTE — MAU Provider Note (Signed)
History     CSN: [id number]  Arrival date and time: 08/06/16 6789   First Provider Initiated Contact with Patient 08/06/16 2039      Chief Complaint  Patient presents with  . Pelvic Pain   Pelvic Pain  The patient's primary symptoms include pelvic pain. The patient's pertinent negatives include no vaginal discharge. This is a new problem. The current episode started today. The problem occurs constantly. The problem has been gradually worsening. The pain is severe (9/10 ). The problem affects the right side. She is not pregnant. Associated symptoms include nausea. Pertinent negatives include no chills, dysuria, fever, frequency, urgency or vomiting. The vaginal discharge was normal. There has been no bleeding. Nothing aggravates the symptoms. She has tried NSAIDs, oral narcotics and heating pads (flexeril takes TID, Oxycodone takes QID as prescribed for spinal stenosis, peripheral neuropathy, fibromyalgia, and carpel tunnell.) for the symptoms. The treatment provided no relief. She is sexually active. No, her partner does not have an STD. She uses nothing for contraception. Menstrual history: LMP 07/23/16  Her past medical history is significant for ovarian cysts.   Past Medical History:  Diagnosis Date  . Allergy    seasonal  . Anemia    goes to [facility], ferahem  . Anxiety   . Arthritis   . Asthma   . Cancer [facility])    Cervical cancer  . Depression   . Fatty liver disease, nonalcoholic   . Fibromyalgia   . Fibromyalgia   . Food poisoning   . GERD (gastroesophageal reflux disease)   . 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 Hypertension   . Lumbar radiculopathy   . Migraines   . Neuropathy   . Obesity   . OSA (obstructive sleep apnea)   . OSA on CPAP   . Peripheral neuropathy   . Shortness of breath dyspnea   . Spinal stenosis     Past Surgical History:  Procedure Laterality Date  . CARPAL TUNNEL RELEASE    . [place]   . CESAREAN SECTION   11/28/2011   Procedure: CESAREAN SECTION;  Surgeon: Woodroe Mode, MD;  Location: [facility];  Service: Obstetrics;  Laterality: N/A;  . COLPOSCOPY    . HYSTEROSCOPY W/D&C N/A 08/17/2015   Procedure: DILATATION AND CURETTAGE /HYSTEROSCOPY;  Surgeon: Ena Dawley, MD;  Location: [facility];  Service: Gynecology;  Laterality: N/A;  . MANDIBLE SURGERY  2008  . WISDOM TOOTH EXTRACTION      Family History  Problem Relation Age of Onset  . Hypertension Mother   . Sickle cell trait Maternal Aunt   . Sickle cell anemia Other   . Schizophrenia Son   . Depression Daughter     Social History  Substance Use Topics  . Smoking status: Never Smoker  . Smokeless tobacco: Never Used     Comment: Pt was exposed to 2nd hand smoke at work years ago.  . Alcohol use No    Allergies:  Allergies  Allergen Reactions  . Cymbalta [Duloxetine Hcl] Other (See Comments)    Head felt cold and tight, loss of conciousness  . Latex Itching, Swelling and Other (See Comments)    Reaction:  Blisters   . Morphine And Related Itching    Prescriptions Prior to Admission  Medication Sig Dispense Refill Last Dose  . albuterol (PROAIR HFA) 108 (90 Base) MCG/ACT inhaler Inhale 2 puffs into the lungs every 6 (six) hours as needed for  wheezing or shortness of breath. 8.5 Inhaler 2   . amLODipine (NORVASC) 10 MG tablet Take 10 mg by mouth daily.   Taking  . ARIPiprazole (ABILIFY) 2 MG tablet Take 1 tablet (2 mg total) by mouth daily. 30 tablet 2 Taking  . clonazePAM (KLONOPIN) 0.5 MG tablet Take 1 tablet (0.5 mg total) by mouth 2 (two) times daily as needed for anxiety. 60 tablet 2 Taking  . cyclobenzaprine (FLEXERIL) 10 MG tablet Take 10 mg by mouth 3 (three) times daily as needed for muscle spasms.    Taking  . doxycycline (VIBRAMYCIN) 100 MG capsule Take 100 mg by mouth 2 (two) times daily.   Taking  . ferumoxytol (FERAHEME) 510 MG/17ML SOLN injection Inject 510 mg into the vein every 3 (three) months. Pt could not  remember when she got this med last, but thinks it was around three months ago.   Taking  . FLUoxetine (PROZAC) 20 MG capsule Take 1 capsule (20 mg total) by mouth daily. 30 capsule 2 Taking  . fluticasone (FLONASE) 50 MCG/ACT nasal spray PLACE 2 SPRAYS INTO BOTH NOSTRILS 2 (TWO) TIMES DAILY. 16 g 5 Taking  . furosemide (LASIX) 80 MG tablet Take 80 mg by mouth 2 (two) times daily.   Taking  . ipratropium-albuterol (DUONEB) 0.5-2.5 (3) MG/3ML SOLN TAKE 3 MLS BY NEBULIZATION EVERY 4 HOURS AS NEEDED (FOR WHEEZING/SHORTNESS OF BREATH). 360 mL 5   . loratadine (CLARITIN) 10 MG tablet TAKE 1 TABLET BY MOUTH EVERY DAY 30 tablet 2 Taking  . metoprolol tartrate (LOPRESSOR) 25 MG tablet Take 25 mg by mouth every morning.    Taking  . metroNIDAZOLE (FLAGYL) 500 MG tablet Take 1 tablet (500 mg total) by mouth 2 (two) times daily. 14 tablet 0 Taking  . montelukast (SINGULAIR) 10 MG tablet TAKE 1 TABLET (10 MG TOTAL) BY MOUTH AT BEDTIME. 30 tablet 4   . Multiple Vitamins-Minerals (MULTIVITAMIN WITH MINERALS) tablet Take 1 tablet by mouth daily.   Taking  . oxyCODONE (ROXICODONE) 15 MG immediate release tablet Take 15 mg by mouth every 6 (six) hours as needed for pain.   Taking  . pantoprazole (PROTONIX) 40 MG tablet TAKE 1 TABLET (40 MG TOTAL) BY MOUTH DAILY. 30 tablet 0 Taking  . potassium chloride SA (K-DUR,KLOR-CON) 20 MEQ tablet Take 20 mEq by mouth 2 (two) times daily.   Taking  . pregabalin (LYRICA) 150 MG capsule Take 150 mg by mouth 2 (two) times daily. Takes with a 75mg  tab for total of 225mg .   Taking  . pregabalin (LYRICA) 75 MG capsule Take 225 mg by mouth 2 (two) times daily. Takes 150 mg and 75 mg to equal 225 mg twice daily.   Taking  . PULMICORT 0.5 MG/2ML nebulizer solution TAKE 2 MLS (0.5 MG TOTAL) BY NEBULIZATION 2 (TWO) TIMES DAILY. DX CODE: CHRONIC ASTHMA J45.909 120 mL 5   . rizatriptan (MAXALT) 10 MG tablet Take 10 mg by mouth as needed for migraine. May repeat in 2 hours if needed   Taking   . topiramate (TOPAMAX) 100 MG tablet Take 100 mg by mouth 2 (two) times daily.   Taking    Review of Systems  Constitutional: Negative for chills and fever.  Gastrointestinal: Positive for nausea. Negative for vomiting.  Genitourinary: Positive for pelvic pain. Negative for dysuria, frequency, urgency, vaginal bleeding and vaginal discharge.   Physical Exam   Blood pressure (!) 110/56, pulse 100, temperature 98.1 F (36.7 C), temperature source Oral, resp. rate 20,  height 5\' 2"  (1.575 m), weight 295 lb (133.8 kg), last menstrual period 07/23/2016.  Physical Exam  Nursing note and vitals reviewed. Constitutional: She is oriented to person, place, and time. She appears well-developed and well-nourished. No distress.  HENT:  Head: Normocephalic.  Cardiovascular: Normal rate.   Respiratory: Effort normal.  GI: Soft. There is no tenderness. There is no rebound.  Neurological: She is alert and oriented to person, place, and time.  Skin: Skin is warm and dry.  Psychiatric: She has a normal mood and affect.     Results for orders placed or performed during the hospital encounter of 08/06/16 (from the past 24 hour(s))  Urinalysis, Routine w reflex microscopic     Status: Abnormal   Collection Time: 08/06/16  8:01 PM  Result Value Ref Range   Color, Urine YELLOW YELLOW   APPearance CLOUDY (A) CLEAR   Specific Gravity, Urine 1.010 1.005 - 1.030   pH 7.0 5.0 - 8.0   Glucose, UA NEGATIVE NEGATIVE mg/dL   Hgb urine dipstick NEGATIVE NEGATIVE   Bilirubin Urine NEGATIVE NEGATIVE   Ketones, ur NEGATIVE NEGATIVE mg/dL   Protein, ur NEGATIVE NEGATIVE mg/dL   Nitrite NEGATIVE NEGATIVE   Leukocytes, UA NEGATIVE NEGATIVE  Pregnancy, urine POC     Status: None   Collection Time: 08/06/16  8:12 PM  Result Value Ref Range   Preg Test, Ur NEGATIVE NEGATIVE  CBC     Status: Abnormal   Collection Time: 08/06/16  8:57 PM  Result Value Ref Range   WBC 18.5 (H) 4.0 - 10.5 K/uL   RBC 5.00 3.87 -  5.11 MIL/uL   Hemoglobin 10.4 (L) 12.0 - 15.0 g/dL   HCT 34.1 (L) 36.0 - 46.0 %   MCV 68.2 (L) 78.0 - 100.0 fL   MCH 20.8 (L) 26.0 - 34.0 pg   MCHC 30.5 30.0 - 36.0 g/dL   RDW 17.4 (H) 11.5 - 15.5 %   Platelets 392 150 - 400 K/uL  Comprehensive metabolic panel     Status: Abnormal   Collection Time: 08/06/16  8:57 PM  Result Value Ref Range   Sodium 138 135 - 145 mmol/L   Potassium 2.7 (LL) 3.5 - 5.1 mmol/L   Chloride 107 101 - 111 mmol/L   CO2 21 (L) 22 - 32 mmol/L   Glucose, Bld 166 (H) 65 - 99 mg/dL   BUN 10 6 - 20 mg/dL   Creatinine, Ser 0.75 0.44 - 1.00 mg/dL   Calcium 8.9 8.9 - 10.3 mg/dL   Total Protein 8.0 6.5 - 8.1 g/dL   Albumin 3.9 3.5 - 5.0 g/dL   AST 31 15 - 41 U/L   ALT 29 14 - 54 U/L   Alkaline Phosphatase 72 38 - 126 U/L   Total Bilirubin 0.4 0.3 - 1.2 mg/dL   GFR calc non Af Amer >60 >60 mL/min   GFR calc Af Amer >60 >60 mL/min   Anion gap 10 5 - 15  Amylase     Status: None   Collection Time: 08/06/16  8:57 PM  Result Value Ref Range   Amylase 40 28 - 100 U/L  Lipase, blood     Status: None   Collection Time: 08/06/16  8:57 PM  Result Value Ref Range   Lipase 23 11 - 51 U/L    MAU Course  Procedures  MDM Patient has had toradol, and she reports that her pain is better at this time. She is sleeping now.  K is 2.7, it looks like she has consistently been low on all previous CMETs. Given 40 of K-dur here. Patient is on Lasix 80mg  BID and 20 meq of K BID, will increase to TID for now, and have patient to FU with PCP.   Assessment and Plan   1. Pelvic pain   2. Hypokalemia    DC home Comfort measures reviewed  RX: k-dur 78meg once per day, in addition to BID dosing for a total of TID  Return to MAU as needed FU with OB as planned  Follow-up Information    SOOD,VINEET, MD Follow up.   Specialty:  Pulmonary Disease Contact information: [address] [place] [address] [phone]            Mathis Bud 08/06/2016,  8:41 PM

## 2016-08-06 NOTE — Discharge Instructions (Signed)
Pelvic Pain, Female °Pelvic pain is pain in your lower belly (abdomen), below your belly button and between your hips. The pain may start suddenly (acute), keep coming back (recurring), or last a long time (chronic). Pelvic pain that lasts longer than six months is considered chronic. There are many causes of pelvic pain. Sometimes the cause of your pelvic pain is not known. °Follow these instructions at home: °· Take over-the-counter and prescription medicines only as told by your doctor. °· Rest as told by your doctor. °· Do not have sex it if hurts. °· Keep a journal of your pelvic pain. Write down: °¨ When the pain started. °¨ Where the pain is located. °¨ What seems to make the pain better or worse, such as food or your menstrual cycle. °¨ Any symptoms you have along with the pain. °· Keep all follow-up visits as told by your doctor. This is important. °Contact a doctor if: °· Medicine does not help your pain. °· Your pain comes back. °· You have new symptoms. °· You have unusual vaginal discharge or bleeding. °· You have a fever or chills. °· You are having a hard time pooping (constipation). °· You have blood in your pee (urine) or poop (stool). °· Your pee smells bad. °· You feel weak or lightheaded. °Get help right away if: °· You have sudden pain that is very bad. °· Your pain continues to get worse. °· You have very bad pain and also have any of the following symptoms: °¨ A fever. °¨ Feeling stick to your stomach (nausea). °¨ Throwing up (vomiting). °¨ Being very sweaty. °· You pass out (lose consciousness). °This information is not intended to replace advice given to you by your health care provider. Make sure you discuss any questions you have with your health care provider. °Document Released: 09/19/2007 Document Revised: 04/27/2015 Document Reviewed: 01/21/2015 °Elsevier Interactive Patient Education © 2017 [employer] ° °

## 2016-10-16 ENCOUNTER — Other Ambulatory Visit: Payer: Self-pay | Admitting: Adult Health

## 2016-10-16 ENCOUNTER — Other Ambulatory Visit: Payer: Self-pay | Admitting: Pulmonary Disease

## 2016-10-16 DIAGNOSIS — R110 Nausea: Secondary | ICD-10-CM

## 2016-10-16 DIAGNOSIS — D259 Leiomyoma of uterus, unspecified: Secondary | ICD-10-CM

## 2016-10-16 DIAGNOSIS — N390 Urinary tract infection, site not specified: Secondary | ICD-10-CM

## 2016-10-19 ENCOUNTER — Encounter (HOSPITAL_COMMUNITY): Payer: Self-pay | Admitting: Psychiatry

## 2016-10-19 ENCOUNTER — Ambulatory Visit (INDEPENDENT_AMBULATORY_CARE_PROVIDER_SITE_OTHER): Payer: Medicare Other | Admitting: Psychiatry

## 2016-10-19 DIAGNOSIS — F331 Major depressive disorder, recurrent, moderate: Secondary | ICD-10-CM

## 2016-10-19 DIAGNOSIS — M549 Dorsalgia, unspecified: Secondary | ICD-10-CM

## 2016-10-19 DIAGNOSIS — R5383 Other fatigue: Secondary | ICD-10-CM | POA: Diagnosis not present

## 2016-10-19 DIAGNOSIS — F410 Panic disorder [episodic paroxysmal anxiety] without agoraphobia: Secondary | ICD-10-CM

## 2016-10-19 DIAGNOSIS — F0630 Mood disorder due to known physiological condition, unspecified: Secondary | ICD-10-CM

## 2016-10-19 DIAGNOSIS — R110 Nausea: Secondary | ICD-10-CM | POA: Diagnosis not present

## 2016-10-19 DIAGNOSIS — Z818 Family history of other mental and behavioral disorders: Secondary | ICD-10-CM

## 2016-10-19 MED ORDER — FLUOXETINE HCL 20 MG PO CAPS: 20.0000 mg | ORAL_CAPSULE | Freq: Every day | ORAL | 2 refills | Status: DC

## 2016-10-19 MED ORDER — ARIPIPRAZOLE 2 MG PO TABS: 2.0000 mg | ORAL_TABLET | Freq: Every day | ORAL | 2 refills | Status: DC

## 2016-10-19 MED ORDER — CLONAZEPAM 0.5 MG PO TABS: 0.5000 mg | ORAL_TABLET | Freq: Every day | ORAL | 2 refills | Status: DC | PRN

## 2016-10-19 NOTE — Progress Notes (Signed)
Patient ID: Cheyenne Arias, female   DOB: 1975/06/05, 41 y.o.   MRN: [id number]  Alpha Follow up outpatient visit   Cheyenne Arias [id number] 42 y.o.  10/19/2016 11:11 AM  Chief Complaint:  Anxiety, depression. Follow up History of Present Illness:   Patient Presents for follow-up and medication management for major depressive disorder. Panic disorder. PTSD.  Patient is a single African-American female diagnosed with multiple medical conditions. She has 3 kids 2 of which are living with her 56 year old daughter and a 56-year-old son.   As per initial evaluation " Patient has been diagnosed with depression and says possible bipolar . Says that she is on Abilify 2 mg for night terrors and depression."  She has moved. She is taking care of her kid with autism there were some court issues that is finally been taken care of.   One son is in prison.  Some dental issues effect sleep and mood as well. May need crown as per refport  Medical complexity/Data: has OSA uses cpap. Which helps her sleep. Has fibromyalgia which complicates her depression. She continues with topamax and lyrica.  Severity of depression : somewhat better. 6/10  Aggravating factors: [name] Medical conditions. Son in prison. fibromyalgia pain.  She also has a possibility of pituitary tumor that is being assessed  Modifying factors; home health  Prior Suicide Attempts: No  Medical History; Past Medical History:  Diagnosis Date  . Allergy    seasonal  . Anemia    goes to [facility], ferahem  . Anxiety   . Arthritis   . Asthma   . Cancer [facility])    Cervical cancer  . Depression   . Fatty liver disease, nonalcoholic   . Fibromyalgia   . Fibromyalgia   . Food poisoning   . GERD (gastroesophageal reflux disease)   . 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 Hypertension   . Lumbar radiculopathy   . Migraines   . Neuropathy   . Obesity   . OSA (obstructive sleep apnea)   . OSA on CPAP   .  Peripheral neuropathy   . Shortness of breath dyspnea   . Spinal stenosis     Allergies: Allergies  Allergen Reactions  . Cymbalta [Duloxetine Hcl] Other (See Comments)    Head felt cold and tight, loss of conciousness  . Latex Itching, Swelling and Other (See Comments)    Reaction:  Blisters   . Morphine And Related Itching    Medications: Outpatient Encounter Prescriptions as of 10/19/2016  Medication Sig  . albuterol (PROAIR HFA) 108 (90 Base) MCG/ACT inhaler Inhale 2 puffs into the lungs every 6 (six) hours as needed for wheezing or shortness of breath.  [name] amLODipine (NORVASC) 10 MG tablet Take 10 mg by mouth daily.  . ARIPiprazole (ABILIFY) 2 MG tablet Take 1 tablet (2 mg total) by mouth daily.  . clonazePAM (KLONOPIN) 0.5 MG tablet Take 1 tablet (0.5 mg total) by mouth daily as needed for anxiety.  . cyclobenzaprine (FLEXERIL) 10 MG tablet Take 10 mg by mouth 3 (three) times daily as needed for muscle spasms.   [name] doxycycline (VIBRAMYCIN) 100 MG capsule Take 100 mg by mouth 2 (two) times daily as needed (skin flare up).   . ferumoxytol (FERAHEME) 510 MG/17ML SOLN injection Inject 510 mg into the vein every 3 (three) months.   [name] FLUoxetine (PROZAC) 20 MG capsule Take 1 capsule (20 mg total) by mouth daily.  . fluticasone (FLONASE)  50 MCG/ACT nasal spray PLACE 2 SPRAYS INTO BOTH NOSTRILS 2 (TWO) TIMES DAILY.  . furosemide (LASIX) 80 MG tablet Take 80 mg by mouth 2 (two) times daily.  [name] ibuprofen (ADVIL,MOTRIN) 800 MG tablet Take 800 mg by mouth 2 (two) times daily.  [name] ipratropium-albuterol (DUONEB) 0.5-2.5 (3) MG/3ML SOLN TAKE 3 MLS BY NEBULIZATION EVERY 4 HOURS AS NEEDED (FOR WHEEZING/SHORTNESS OF BREATH).  [name] loratadine (CLARITIN) 10 MG tablet TAKE 1 TABLET BY MOUTH EVERY DAY  . metoprolol tartrate (LOPRESSOR) 25 MG tablet Take 25 mg by mouth 2 (two) times daily.   . montelukast (SINGULAIR) 10 MG tablet TAKE 1 TABLET BY MOUTH AT BEDTIME  . Multiple Vitamins-Minerals (MULTIVITAMIN  WITH MINERALS) tablet Take 1 tablet by mouth daily.  [name] oxyCODONE (ROXICODONE) 15 MG immediate release tablet Take 15 mg by mouth every 6 (six) hours as needed for pain.  . OXYGEN Inhale 2 L into the lungs at bedtime.  . pantoprazole (PROTONIX) 40 MG tablet TAKE 1 TABLET (40 MG TOTAL) BY MOUTH DAILY.  [name] potassium chloride SA (K-DUR,KLOR-CON) 20 MEQ tablet Take 20 mEq by mouth 2 (two) times daily.  . potassium chloride SA (K-DUR,KLOR-CON) 20 MEQ tablet Take 1 tablet (20 mEq total) by mouth daily.  . pregabalin (LYRICA) 150 MG capsule Take 150 mg by mouth 2 (two) times daily. Takes with a '75mg'$  tab for total of '225mg'$ .  . pregabalin (LYRICA) 75 MG capsule Take 225 mg by mouth 2 (two) times daily. Takes 150 mg and 75 mg to equal 225 mg twice daily.  [name] PULMICORT 0.5 MG/2ML nebulizer solution TAKE 2 MLS (0.5 MG TOTAL) BY NEBULIZATION 2 (TWO) TIMES DAILY. DX CODE: CHRONIC ASTHMA J45.909  . rizatriptan (MAXALT) 10 MG tablet Take 10 mg by mouth as needed for migraine. May repeat in 2 hours if needed  . topiramate (TOPAMAX) 100 MG tablet Take 100 mg by mouth 2 (two) times daily.  [name] triamcinolone cream (KENALOG) 0.1 % Apply 1 application topically daily as needed (rash).   . [DISCONTINUED] ARIPiprazole (ABILIFY) 2 MG tablet Take 1 tablet (2 mg total) by mouth daily.  . [DISCONTINUED] clonazePAM (KLONOPIN) 0.5 MG tablet Take 1 tablet (0.5 mg total) by mouth 2 (two) times daily as needed for anxiety.  . [DISCONTINUED] FLUoxetine (PROZAC) 20 MG capsule Take 1 capsule (20 mg total) by mouth daily.   No facility-administered encounter medications on file as of 10/19/2016.      Family History; Family History  Problem Relation Age of Onset  . Hypertension Mother   . Sickle cell trait Maternal Aunt   . Sickle cell anemia Other   . Schizophrenia Son   . Depression Daughter        Labs:  Recent Results (from the past 2160 hour(s))  Urinalysis, Routine w reflex microscopic     Status: Abnormal    Collection Time: 08/06/16  8:01 PM  Result Value Ref Range   Color, Urine YELLOW YELLOW   APPearance CLOUDY (A) CLEAR   Specific Gravity, Urine 1.010 1.005 - 1.030   pH 7.0 5.0 - 8.0   Glucose, UA NEGATIVE NEGATIVE mg/dL   Hgb urine dipstick NEGATIVE NEGATIVE   Bilirubin Urine NEGATIVE NEGATIVE   Ketones, ur NEGATIVE NEGATIVE mg/dL   Protein, ur NEGATIVE NEGATIVE mg/dL   Nitrite NEGATIVE NEGATIVE   Leukocytes, UA NEGATIVE NEGATIVE  Pregnancy, urine POC     Status: None   Collection Time: 08/06/16  8:12 PM  Result Value Ref Range   Preg  Test, Ur NEGATIVE NEGATIVE    Comment:        THE SENSITIVITY OF THIS METHODOLOGY IS >24 mIU/mL   CBC     Status: Abnormal   Collection Time: 08/06/16  8:57 PM  Result Value Ref Range   WBC 18.5 (H) 4.0 - 10.5 K/uL   RBC 5.00 3.87 - 5.11 MIL/uL   Hemoglobin 10.4 (L) 12.0 - 15.0 g/dL   HCT 34.1 (L) 36.0 - 46.0 %   MCV 68.2 (L) 78.0 - 100.0 fL   MCH 20.8 (L) 26.0 - 34.0 pg   MCHC 30.5 30.0 - 36.0 g/dL   RDW 17.4 (H) 11.5 - 15.5 %   Platelets 392 150 - 400 K/uL  Comprehensive metabolic panel     Status: Abnormal   Collection Time: 08/06/16  8:57 PM  Result Value Ref Range   Sodium 138 135 - 145 mmol/L   Potassium 2.7 (LL) 3.5 - 5.1 mmol/L    Comment: CRITICAL RESULT CALLED TO, READ BACK BY AND VERIFIED WITH: HAYES A AT 2131 ON [id number] BY FORSYTH K    Chloride 107 101 - 111 mmol/L   CO2 21 (L) 22 - 32 mmol/L   Glucose, Bld 166 (H) 65 - 99 mg/dL   BUN 10 6 - 20 mg/dL   Creatinine, Ser 0.75 0.44 - 1.00 mg/dL   Calcium 8.9 8.9 - 10.3 mg/dL   Total Protein 8.0 6.5 - 8.1 g/dL   Albumin 3.9 3.5 - 5.0 g/dL   AST 31 15 - 41 U/L   ALT 29 14 - 54 U/L   Alkaline Phosphatase 72 38 - 126 U/L   Total Bilirubin 0.4 0.3 - 1.2 mg/dL   GFR calc non Af Amer >60 >60 mL/min   GFR calc Af Amer >60 >60 mL/min    Comment: (NOTE) The eGFR has been calculated using the CKD EPI equation. This calculation has not been validated in all clinical  situations. eGFR's persistently <60 mL/min signify possible Chronic Kidney Disease.    Anion gap 10 5 - 15  Amylase     Status: None   Collection Time: 08/06/16  8:57 PM  Result Value Ref Range   Amylase 40 28 - 100 U/L  Lipase, blood     Status: None   Collection Time: 08/06/16  8:57 PM  Result Value Ref Range   Lipase 23 11 - 51 U/L        Mental Status Examination;   Psychiatric Specialty Exam: Physical Exam  Constitutional: She appears well-nourished.  Skin: She is not diaphoretic.    Review of Systems  Constitutional: Positive for malaise/fatigue. Negative for fever.  Cardiovascular: Negative for palpitations.  Gastrointestinal: Negative for nausea and vomiting.  Skin: Negative for rash.  Psychiatric/Behavioral: Negative for substance abuse and suicidal ideas.    There were no vitals taken for this visit.There is no height or weight on file to calculate BMI.  General Appearance: Casual  Eye Contact::  Fair  Speech:  Slow  Volume:  Normal  Mood:  Less dysphoric  Affect:  congruent  Thought Process:  Coherent  Orientation:  Full (Time, Place, and Person)  Thought Content:  Rumination  Suicidal Thoughts:  No  Homicidal Thoughts:  No  Memory:  Immediate;   Fair Recent;   Fair  Judgement:  Fair  Insight:  Fair  Psychomotor Activity:  Normal  Concentration:  Fair  Recall:  Fair  Akathisia:  Negative  Handed:  Right  AIMS (if indicated):  Assets:  Communication Skills Desire for Improvement Financial Resources/Insurance Housing  Sleep:        Assessment: Axis I: Major depressive disorder recurrent moderate. Panic disorder. Mood disorder otherwise nonspecified. Possible related  secondary to general medical condition including chronic iback pain  Axis II: Deferred   Axis III:  Past Medical History:  Diagnosis Date  . Allergy    seasonal  . Anemia    goes to [facility], ferahem  . Anxiety   . Arthritis   . Asthma   . Cancer [facility])    Cervical  cancer  . Depression   . Fatty liver disease, nonalcoholic   . Fibromyalgia   . Fibromyalgia   . Food poisoning   . GERD (gastroesophageal reflux disease)   . 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 Hypertension   . Lumbar radiculopathy   . Migraines   . Neuropathy   . Obesity   . OSA (obstructive sleep apnea)   . OSA on CPAP   . Peripheral neuropathy   . Shortness of breath dyspnea   . Spinal stenosis     Axis IV: Psychosocial, single mom, multiple medical conditions.   Treatment Plan and Summary:  Mood disorder: better. Less depressed. Continue abilify. No tremors also on topomax for migraines  Depressionand anxiety: fluctuates. Continue prozac  Panic disorder; infequent. havent had klonopine for one month. She feels uncomfortable without. Will reinstate but once a day not bid  Refer to sleep provider in regard to insomnia : complains of poor sleep and mask issues at times Reviewed sleep hygiene Questions addressed. FU 2-3 months.   Merian Capron, MD 10/19/2016

## 2016-11-01 ENCOUNTER — Telehealth: Payer: Self-pay | Admitting: Pulmonary Disease

## 2016-11-01 DIAGNOSIS — D259 Leiomyoma of uterus, unspecified: Secondary | ICD-10-CM

## 2016-11-01 DIAGNOSIS — R110 Nausea: Secondary | ICD-10-CM

## 2016-11-01 DIAGNOSIS — N390 Urinary tract infection, site not specified: Secondary | ICD-10-CM

## 2016-11-01 DIAGNOSIS — J45909 Unspecified asthma, uncomplicated: Secondary | ICD-10-CM

## 2016-11-01 NOTE — Telephone Encounter (Signed)
ATC pt, unable to leave vm, mailbox is full, will need to call back

## 2016-11-02 NOTE — Telephone Encounter (Signed)
Attempted to contact pt. No answer, no option to leave a message due to her voicemail being full. Will try back.  

## 2016-11-05 MED ORDER — IPRATROPIUM-ALBUTEROL 0.5-2.5 (3) MG/3ML IN SOLN: RESPIRATORY_TRACT | 0 refills | Status: DC

## 2016-11-05 MED ORDER — BUDESONIDE 0.5 MG/2ML IN SUSP: 0.5000 mg | Freq: Two times a day (BID) | RESPIRATORY_TRACT | 0 refills | Status: DC

## 2016-11-05 NOTE — Telephone Encounter (Signed)
I have sent refills in to the pharmacy and advised that no other refills can be given without an appt with VS>  I have updated the DX code as well.    I attempted to call the pt to make her aware but the VM is full.  Will sign off at this time.

## 2016-11-20 DIAGNOSIS — G43709 Chronic migraine without aura, not intractable, without status migrainosus: Secondary | ICD-10-CM | POA: Insufficient documentation

## 2016-11-20 DIAGNOSIS — G8929 Other chronic pain: Secondary | ICD-10-CM | POA: Insufficient documentation

## 2016-11-28 DIAGNOSIS — M51369 Other intervertebral disc degeneration, lumbar region without mention of lumbar back pain or lower extremity pain: Secondary | ICD-10-CM | POA: Insufficient documentation

## 2016-11-28 DIAGNOSIS — M51379 Other intervertebral disc degeneration, lumbosacral region without mention of lumbar back pain or lower extremity pain: Secondary | ICD-10-CM | POA: Insufficient documentation

## 2016-12-05 ENCOUNTER — Telehealth: Payer: Self-pay | Admitting: *Deleted

## 2016-12-05 ENCOUNTER — Other Ambulatory Visit: Payer: Self-pay | Admitting: Hematology and Oncology

## 2016-12-05 ENCOUNTER — Telehealth: Payer: Self-pay | Admitting: Hematology and Oncology

## 2016-12-05 DIAGNOSIS — D509 Iron deficiency anemia, unspecified: Secondary | ICD-10-CM

## 2016-12-05 NOTE — Telephone Encounter (Signed)
I sent scheduling msg to see her tomorrow

## 2016-12-05 NOTE — Telephone Encounter (Signed)
Pt left a message stating Dr Raphael Gibney told her labs were very low and she needed an iron infusion.  Called Dr Doran Stabler office to fax [place] the labs.

## 2016-12-05 NOTE — Telephone Encounter (Signed)
sw pt to confirm 8/23 appt at 11 am per sch msg

## 2016-12-06 ENCOUNTER — Ambulatory Visit (HOSPITAL_BASED_OUTPATIENT_CLINIC_OR_DEPARTMENT_OTHER): Payer: Medicare Other | Admitting: Hematology and Oncology

## 2016-12-06 ENCOUNTER — Other Ambulatory Visit (HOSPITAL_BASED_OUTPATIENT_CLINIC_OR_DEPARTMENT_OTHER): Payer: Medicare Other

## 2016-12-06 ENCOUNTER — Telehealth: Payer: Self-pay | Admitting: Hematology and Oncology

## 2016-12-06 VITALS — BP 154/79 | HR 105 | Temp 98.9°F | Resp 20 | Ht 62.0 in | Wt 281.1 lb

## 2016-12-06 DIAGNOSIS — D509 Iron deficiency anemia, unspecified: Secondary | ICD-10-CM | POA: Diagnosis present

## 2016-12-06 DIAGNOSIS — N920 Excessive and frequent menstruation with regular cycle: Secondary | ICD-10-CM

## 2016-12-06 DIAGNOSIS — D500 Iron deficiency anemia secondary to blood loss (chronic): Secondary | ICD-10-CM

## 2016-12-06 DIAGNOSIS — K625 Hemorrhage of anus and rectum: Secondary | ICD-10-CM

## 2016-12-06 LAB — CBC & DIFF AND RETIC
BASO%: 0.3 % (ref 0.0–2.0)
Basophils Absolute: 0 10*3/uL (ref 0.0–0.1)
EOS ABS: 0.2 10*3/uL (ref 0.0–0.5)
EOS%: 2.2 % (ref 0.0–7.0)
HCT: 30.3 % — ABNORMAL LOW (ref 34.8–46.6)
HGB: 8.6 g/dL — ABNORMAL LOW (ref 11.6–15.9)
IMMATURE RETIC FRACT: 15.2 % — AB (ref 1.60–10.00)
LYMPH%: 20.9 % (ref 14.0–49.7)
MCH: 17.6 pg — ABNORMAL LOW (ref 25.1–34.0)
MCHC: 28.4 g/dL — ABNORMAL LOW (ref 31.5–36.0)
MCV: 62.1 fL — ABNORMAL LOW (ref 79.5–101.0)
MONO#: 0.4 10*3/uL (ref 0.1–0.9)
MONO%: 3.8 % (ref 0.0–14.0)
NEUT%: 72.8 % (ref 38.4–76.8)
NEUTROS ABS: 7.8 10*3/uL — AB (ref 1.5–6.5)
PLATELETS: 396 10*3/uL (ref 145–400)
RBC: 4.88 10*6/uL (ref 3.70–5.45)
RDW: 18.7 % — ABNORMAL HIGH (ref 11.2–14.5)
Retic %: 1.54 % (ref 0.70–2.10)
Retic Ct Abs: 75.15 10*3/uL (ref 33.70–90.70)
WBC: 10.7 10*3/uL — AB (ref 3.9–10.3)
lymph#: 2.2 10*3/uL (ref 0.9–3.3)
nRBC: 0 % (ref 0–0)

## 2016-12-06 LAB — IRON AND TIBC
%SAT: 3 % — AB (ref 21–57)
Iron: 14 ug/dL — ABNORMAL LOW (ref 41–142)
TIBC: 424 ug/dL (ref 236–444)
UIBC: 410 ug/dL — ABNORMAL HIGH (ref 120–384)

## 2016-12-06 LAB — FERRITIN: FERRITIN: 25 ng/mL (ref 9–269)

## 2016-12-06 NOTE — Telephone Encounter (Signed)
Gave patient AVS and calendar of upcoming August and September appointment.

## 2016-12-07 ENCOUNTER — Ambulatory Visit: Payer: Self-pay

## 2016-12-07 ENCOUNTER — Encounter: Payer: Self-pay | Admitting: Hematology and Oncology

## 2016-12-07 NOTE — Progress Notes (Signed)
[place] OFFICE PROGRESS NOTE  Cheyenne Gip, NP SUMMARY OF HEMATOLOGIC HISTORY:  This patient have chronic fatigue, fibromyalgia, uterine fibroids with chronic menorrhagia, severe iron deficiency and reactive thrombocytosis. She had chronic microcytic anemia for many years. In fact, there were no changes with MCV and the highest hemoglobin was 10.7. She had received intermittent intravenous iron infusion in the past, her last infusion was in January 2014. The patient could not tolerate oral iron supplements. She have chronic fatigue with fibromyalgia, obstructive sleep apnea and have been oxygen therapy for 2 years. She takes regular ibuprofen for pelvic pain. She has irregular menstruation and abnormal Pap smear in the past The patient had history of intermittent epistaxis for many years, usually coming from the left nasal passage. The patient has severe microcytic anemia. She received multiple doses of intravenous iron, last given in September 2017 INTERVAL HISTORY: Cheyenne Arias 41 y.o. female returns for further follow-up.  She is referred back by gynecologist She had recent significant menorrhagia She also complained of intermittent rectal bleeding She denies epistaxis or hematuria. She complain of profound fatigue.  She uses oxygen therapy chronically She denies pica.  I have reviewed the past medical history, past surgical history, social history and family history with the patient and they are unchanged from previous note.  ALLERGIES:  is allergic to cymbalta [duloxetine hcl]; latex; and morphine and related.  MEDICATIONS:  Current Outpatient Prescriptions  Medication Sig Dispense Refill  . albuterol (PROAIR HFA) 108 (90 Base) MCG/ACT inhaler Inhale 2 puffs into the lungs every 6 (six) hours as needed for wheezing or shortness of breath. 8.5 Inhaler 2  . amLODipine (NORVASC) 10 MG tablet Take 10 mg by mouth daily.    . ARIPiprazole (ABILIFY) 2 MG  tablet Take 1 tablet (2 mg total) by mouth daily. 30 tablet 2  . budesonide (PULMICORT) 0.5 MG/2ML nebulizer solution Take 2 mLs (0.5 mg total) by nebulization 2 (two) times daily. Dx Code: Chronic Asthma J45.909 120 mL 0  . clonazePAM (KLONOPIN) 0.5 MG tablet Take 1 tablet (0.5 mg total) by mouth daily as needed for anxiety. 30 tablet 2  . cyclobenzaprine (FLEXERIL) 10 MG tablet Take 10 mg by mouth 3 (three) times daily as needed for muscle spasms.     [name] doxycycline (VIBRAMYCIN) 100 MG capsule Take 100 mg by mouth 2 (two) times daily as needed (skin flare up).     . ferumoxytol (FERAHEME) 510 MG/17ML SOLN injection Inject 510 mg into the vein every 3 (three) months.     [name] FLUoxetine (PROZAC) 20 MG capsule Take 1 capsule (20 mg total) by mouth daily. 30 capsule 2  . fluticasone (FLONASE) 50 MCG/ACT nasal spray PLACE 2 SPRAYS INTO BOTH NOSTRILS 2 (TWO) TIMES DAILY. 16 g 5  . furosemide (LASIX) 80 MG tablet Take 80 mg by mouth 2 (two) times daily.    [name] ibuprofen (ADVIL,MOTRIN) 800 MG tablet Take 800 mg by mouth 2 (two) times daily.    [name] ipratropium-albuterol (DUONEB) 0.5-2.5 (3) MG/3ML SOLN TAKE 3 MLS BY NEBULIZATION EVERY 4 HOURS AS NEEDED (FOR WHEEZING/SHORTNESS OF BREATH). 360 mL 0  . loratadine (CLARITIN) 10 MG tablet TAKE 1 TABLET BY MOUTH EVERY DAY 30 tablet 2  . metoprolol tartrate (LOPRESSOR) 25 MG tablet Take 25 mg by mouth 2 (two) times daily.     . montelukast (SINGULAIR) 10 MG tablet TAKE 1 TABLET BY MOUTH AT BEDTIME 30 tablet 4  . Multiple Vitamins-Minerals (MULTIVITAMIN WITH MINERALS)  tablet Take 1 tablet by mouth daily.    [name] oxyCODONE (ROXICODONE) 15 MG immediate release tablet Take 15 mg by mouth every 6 (six) hours as needed for pain.    . OXYGEN Inhale 2 L into the lungs at bedtime.    . pantoprazole (PROTONIX) 40 MG tablet TAKE 1 TABLET (40 MG TOTAL) BY MOUTH DAILY. 30 tablet 0  . potassium chloride SA (K-DUR,KLOR-CON) 20 MEQ tablet Take 20 mEq by mouth 2 (two) times daily.    .  potassium chloride SA (K-DUR,KLOR-CON) 20 MEQ tablet Take 1 tablet (20 mEq total) by mouth daily. 7 tablet 0  . pregabalin (LYRICA) 150 MG capsule Take 150 mg by mouth 2 (two) times daily. Takes with a 75mg  tab for total of 225mg .    . pregabalin (LYRICA) 75 MG capsule Take 225 mg by mouth 2 (two) times daily. Takes 150 mg and 75 mg to equal 225 mg twice daily.    . rizatriptan (MAXALT) 10 MG tablet Take 10 mg by mouth as needed for migraine. May repeat in 2 hours if needed    . topiramate (TOPAMAX) 100 MG tablet Take 100 mg by mouth 2 (two) times daily.    [name] triamcinolone cream (KENALOG) 0.1 % Apply 1 application topically daily as needed (rash).      No current facility-administered medications for this visit.      REVIEW OF SYSTEMS:   Constitutional: Denies fevers, chills or night sweats Eyes: Denies blurriness of vision Ears, nose, mouth, throat, and face: Denies mucositis or sore throat Cardiovascular: Denies palpitation, chest discomfort or lower extremity swelling Gastrointestinal:  Denies nausea, heartburn or change in bowel habits Skin: Denies abnormal skin rashes Lymphatics: Denies new lymphadenopathy or easy bruising Neurological:Denies numbness, tingling or new weaknesses Behavioral/Psych: Mood is stable, no new changes  All other systems were reviewed with the patient and are negative.  PHYSICAL EXAMINATION: ECOG PERFORMANCE STATUS: 2 - Symptomatic, <50% confined to bed  Vitals:   12/06/16 1204  BP: (!) 154/79  Pulse: (!) 105  Resp: 20  Temp: 98.9 F (37.2 C)  SpO2: 100%   Filed Weights   12/06/16 1204  Weight: 281 lb 1.6 oz (127.5 kg)    GENERAL:alert, no distress and comfortable.  She is morbidly obese SKIN: skin color, texture, turgor are normal, no rashes or significant lesions EYES: normal, Conjunctiva are pale and non-injected, sclera clear OROPHARYNX:no exudate, no erythema and lips, buccal mucosa, and tongue normal  NECK: supple, thyroid normal size,  non-tender, without nodularity LYMPH:  no palpable lymphadenopathy in the cervical, axillary or inguinal LUNGS: clear to auscultation and percussion with normal breathing effort HEART: regular rate & rhythm and no murmurs and no lower extremity edema ABDOMEN:abdomen soft, non-tender and normal bowel sounds Musculoskeletal:no cyanosis of digits and no clubbing  NEURO: alert & oriented x 3 with fluent speech, no focal motor/sensory deficits  LABORATORY DATA:  I have reviewed the data as listed     Component Value Date/Time   NA 138 08/06/2016 2057   NA 139 08/29/2012 1102   K 2.7 (LL) 08/06/2016 2057   K 3.2 (L) 08/29/2012 1102   CL 107 08/06/2016 2057   CL 105 08/29/2012 1102   CO2 21 (L) 08/06/2016 2057   CO2 23 08/29/2012 1102   GLUCOSE 166 (H) 08/06/2016 2057   GLUCOSE 130 (H) 08/29/2012 1102   BUN 10 08/06/2016 2057   BUN 5.8 (L) 08/29/2012 1102   CREATININE 0.75 08/06/2016 2057   CREATININE  0.8 08/29/2012 1102   CALCIUM 8.9 08/06/2016 2057   CALCIUM 8.6 08/29/2012 1102   PROT 8.0 08/06/2016 2057   PROT 7.3 08/29/2012 1102   ALBUMIN 3.9 08/06/2016 2057   ALBUMIN 3.6 08/29/2012 1102   AST 31 08/06/2016 2057   AST 22 08/29/2012 1102   ALT 29 08/06/2016 2057   ALT 23 08/29/2012 1102   ALKPHOS 72 08/06/2016 2057   ALKPHOS 86 08/29/2012 1102   BILITOT 0.4 08/06/2016 2057   BILITOT 0.22 08/29/2012 1102   GFRNONAA >60 08/06/2016 2057   GFRAA >60 08/06/2016 2057    No results found for: SPEP, UPEP  Lab Results  Component Value Date   WBC 10.7 (H) 12/06/2016   NEUTROABS 7.8 (H) 12/06/2016   HGB 8.6 (L) 12/06/2016   HCT 30.3 (L) 12/06/2016   MCV 62.1 (L) 12/06/2016   PLT 396 12/06/2016      Chemistry      Component Value Date/Time   NA 138 08/06/2016 2057   NA 139 08/29/2012 1102   K 2.7 (LL) 08/06/2016 2057   K 3.2 (L) 08/29/2012 1102   CL 107 08/06/2016 2057   CL 105 08/29/2012 1102   CO2 21 (L) 08/06/2016 2057   CO2 23 08/29/2012 1102   BUN 10  08/06/2016 2057   BUN 5.8 (L) 08/29/2012 1102   CREATININE 0.75 08/06/2016 2057   CREATININE 0.8 08/29/2012 1102      Component Value Date/Time   CALCIUM 8.9 08/06/2016 2057   CALCIUM 8.6 08/29/2012 1102   ALKPHOS 72 08/06/2016 2057   ALKPHOS 86 08/29/2012 1102   AST 31 08/06/2016 2057   AST 22 08/29/2012 1102   ALT 29 08/06/2016 2057   ALT 23 08/29/2012 1102   BILITOT 0.4 08/06/2016 2057   BILITOT 0.22 08/29/2012 1102       ASSESSMENT & PLAN:  IDA (iron deficiency anemia) The most likely cause of her anemia is due to chronic blood loss/malabsorption syndrome. We discussed some of the risks, benefits, and alternatives of intravenous iron infusions. The patient is symptomatic from anemia and the iron level is critically low. She tolerated oral iron supplement poorly and desires to achieved higher levels of iron faster for adequate hematopoesis. Some of the side-effects to be expected including risks of infusion reactions, phlebitis, headaches, nausea and fatigue.  The patient is willing to proceed. Patient education material was dispensed.  Goal is to keep ferritin level greater than 50 I would give her 2 doses of IV iron She does not need blood transfusion I will bring her back for repeat labs in 1 month to make sure she has adequate iron replacement  Menorrhagia with regular cycle She was seen by GYN physician who did extensive evaluation for recurrent menorrhagia. Recent Pap smear performed on 11/27/2016 was negative for malignancy. She has been referred to another gynecologist to consider possible hysterectomy, according to the patient  Rectal bleeding She has been complaining of intermittent rectal bleeding I recommend GI referral to exclude significant source of GI bleed as a cause of her severe iron deficiency anemia and she agreed   Orders Placed This Encounter  Procedures  . Ambulatory referral to Gastroenterology    Referral Priority:   Routine    Referral Type:    Consultation    Referral Reason:   Specialty Services Required    Number of Visits Requested:   1    All questions were answered. The patient knows to call the clinic with any problems, questions  or concerns. No barriers to learning was detected.  I spent 15 minutes counseling the patient face to face. The total time spent in the appointment was 20 minutes and more than 50% was on counseling.     Heath Lark, MD 8/24/20187:43 AM

## 2016-12-07 NOTE — Assessment & Plan Note (Signed)
She has been complaining of intermittent rectal bleeding I recommend GI referral to exclude significant source of GI bleed as a cause of her severe iron deficiency anemia and she agreed

## 2016-12-07 NOTE — Assessment & Plan Note (Signed)
The most likely cause of her anemia is due to chronic blood loss/malabsorption syndrome. We discussed some of the risks, benefits, and alternatives of intravenous iron infusions. The patient is symptomatic from anemia and the iron level is critically low. She tolerated oral iron supplement poorly and desires to achieved higher levels of iron faster for adequate hematopoesis. Some of the side-effects to be expected including risks of infusion reactions, phlebitis, headaches, nausea and fatigue.  The patient is willing to proceed. Patient education material was dispensed.  Goal is to keep ferritin level greater than 50 I would give her 2 doses of IV iron She does not need blood transfusion I will bring her back for repeat labs in 1 month to make sure she has adequate iron replacement

## 2016-12-07 NOTE — Assessment & Plan Note (Signed)
She was seen by GYN physician who did extensive evaluation for recurrent menorrhagia. Recent Pap smear performed on 11/27/2016 was negative for malignancy. She has been referred to another gynecologist to consider possible hysterectomy, according to the patient

## 2016-12-11 ENCOUNTER — Other Ambulatory Visit: Payer: Self-pay | Admitting: *Deleted

## 2016-12-11 ENCOUNTER — Telehealth: Payer: Self-pay | Admitting: *Deleted

## 2016-12-11 ENCOUNTER — Telehealth: Payer: Self-pay | Admitting: Hematology and Oncology

## 2016-12-11 ENCOUNTER — Ambulatory Visit (HOSPITAL_BASED_OUTPATIENT_CLINIC_OR_DEPARTMENT_OTHER): Payer: Medicare Other

## 2016-12-11 DIAGNOSIS — D72829 Elevated white blood cell count, unspecified: Secondary | ICD-10-CM

## 2016-12-11 LAB — CBC WITH DIFFERENTIAL/PLATELET
BASO%: 0.2 % (ref 0.0–2.0)
BASOS ABS: 0 10*3/uL (ref 0.0–0.1)
EOS ABS: 0.2 10*3/uL (ref 0.0–0.5)
EOS%: 1.6 % (ref 0.0–7.0)
HEMATOCRIT: 30 % — AB (ref 34.8–46.6)
HEMOGLOBIN: 8.5 g/dL — AB (ref 11.6–15.9)
LYMPH#: 2.5 10*3/uL (ref 0.9–3.3)
LYMPH%: 20 % (ref 14.0–49.7)
MCH: 17.6 pg — AB (ref 25.1–34.0)
MCHC: 28.3 g/dL — ABNORMAL LOW (ref 31.5–36.0)
MCV: 62.2 fL — AB (ref 79.5–101.0)
MONO#: 0.5 10*3/uL (ref 0.1–0.9)
MONO%: 3.9 % (ref 0.0–14.0)
NEUT%: 74.3 % (ref 38.4–76.8)
NEUTROS ABS: 9.3 10*3/uL — AB (ref 1.5–6.5)
PLATELETS: 392 10*3/uL (ref 145–400)
RBC: 4.82 10*6/uL (ref 3.70–5.45)
RDW: 18.3 % — ABNORMAL HIGH (ref 11.2–14.5)
WBC: 12.4 10*3/uL — AB (ref 3.9–10.3)

## 2016-12-11 NOTE — Telephone Encounter (Signed)
No blood needed. Pt to come back on Friday for iron infusion

## 2016-12-11 NOTE — Telephone Encounter (Signed)
Pt walked into [facility] requesting to see Dr Alvy Bimler' nurse. States she had to miss her iron infusion last week due to "dire family emergency". Wants to know if she can get iron sooner than this Friday because she "is on her cycle and has been losing a lot of blood". Infusion room is booked for the next few days.  Last Hgb was 8.6. Will add on for labs today. CBC and hold tube

## 2016-12-11 NOTE — Telephone Encounter (Signed)
R/s missed appt per sch message - patient is aware of new appt date and time.

## 2016-12-14 ENCOUNTER — Ambulatory Visit (HOSPITAL_BASED_OUTPATIENT_CLINIC_OR_DEPARTMENT_OTHER): Payer: Medicare Other

## 2016-12-14 VITALS — BP 138/79 | HR 71 | Temp 99.1°F | Resp 20

## 2016-12-14 DIAGNOSIS — D509 Iron deficiency anemia, unspecified: Secondary | ICD-10-CM | POA: Diagnosis not present

## 2016-12-14 MED ORDER — SODIUM CHLORIDE 0.9 % IV SOLN
Freq: Once | INTRAVENOUS | Status: AC
  Administered 2016-12-14: 16:00:00 via INTRAVENOUS

## 2016-12-14 MED ORDER — SODIUM CHLORIDE 0.9 % IV SOLN
510.0000 mg | Freq: Once | INTRAVENOUS | Status: AC
  Administered 2016-12-14: 510 mg via INTRAVENOUS
  Filled 2016-12-14: qty 17

## 2016-12-14 NOTE — Patient Instructions (Signed)

## 2016-12-20 ENCOUNTER — Other Ambulatory Visit: Payer: Self-pay

## 2016-12-20 ENCOUNTER — Encounter: Payer: Self-pay | Admitting: Gastroenterology

## 2016-12-20 ENCOUNTER — Emergency Department (HOSPITAL_BASED_OUTPATIENT_CLINIC_OR_DEPARTMENT_OTHER): Payer: Medicare Other

## 2016-12-20 ENCOUNTER — Emergency Department (HOSPITAL_BASED_OUTPATIENT_CLINIC_OR_DEPARTMENT_OTHER)
Admission: EM | Admit: 2016-12-20 | Discharge: 2016-12-21 | Disposition: A | Discharge status: Home / Self Care | Payer: Medicare Other | Attending: Emergency Medicine | Admitting: Emergency Medicine

## 2016-12-20 ENCOUNTER — Telehealth: Payer: Self-pay | Admitting: Pulmonary Disease

## 2016-12-20 ENCOUNTER — Encounter (HOSPITAL_BASED_OUTPATIENT_CLINIC_OR_DEPARTMENT_OTHER): Payer: Self-pay | Admitting: *Deleted

## 2016-12-20 DIAGNOSIS — Z8541 Personal history of malignant neoplasm of cervix uteri: Secondary | ICD-10-CM | POA: Insufficient documentation

## 2016-12-20 DIAGNOSIS — J209 Acute bronchitis, unspecified: Secondary | ICD-10-CM | POA: Insufficient documentation

## 2016-12-20 DIAGNOSIS — Z79899 Other long term (current) drug therapy: Secondary | ICD-10-CM | POA: Diagnosis not present

## 2016-12-20 DIAGNOSIS — I10 Essential (primary) hypertension: Secondary | ICD-10-CM | POA: Diagnosis not present

## 2016-12-20 DIAGNOSIS — Z91040 Latex allergy status: Secondary | ICD-10-CM | POA: Insufficient documentation

## 2016-12-20 DIAGNOSIS — R05 Cough: Secondary | ICD-10-CM | POA: Diagnosis present

## 2016-12-20 MED ORDER — AMOXICILLIN-POT CLAVULANATE 875-125 MG PO TABS: 1.0000 | ORAL_TABLET | Freq: Two times a day (BID) | ORAL | 0 refills | Status: DC

## 2016-12-20 MED ORDER — PREDNISONE 50 MG PO TABS
60.0000 mg | ORAL_TABLET | Freq: Once | ORAL | Status: AC
  Administered 2016-12-21: 60 mg via ORAL
  Filled 2016-12-20: qty 1

## 2016-12-20 MED ORDER — ALBUTEROL SULFATE HFA 108 (90 BASE) MCG/ACT IN AERS
2.0000 | INHALATION_SPRAY | RESPIRATORY_TRACT | Status: DC
  Administered 2016-12-21: 2 via RESPIRATORY_TRACT
  Filled 2016-12-20: qty 6.7

## 2016-12-20 MED ORDER — ACETAMINOPHEN 325 MG PO TABS
650.0000 mg | ORAL_TABLET | Freq: Once | ORAL | Status: AC
  Administered 2016-12-21: 650 mg via ORAL
  Filled 2016-12-20: qty 2

## 2016-12-20 NOTE — ED Triage Notes (Signed)
Productive cough with yellow sputum. She took her first dose of Amoxicillin today. She used her neb tx 3 hours ago. She is in no distress at triage.

## 2016-12-20 NOTE — Telephone Encounter (Signed)
lmtcb x1 for pt.  Will send Rx after verifying pharmacy.

## 2016-12-20 NOTE — Telephone Encounter (Signed)
Patient is returning phone call. Pharmacy [facility] on [address].

## 2016-12-20 NOTE — Telephone Encounter (Signed)
Pt is aware of VS's recommendations and voiced her understanding. Rx has been sent to preferred pharmacy. Nothing further needed.

## 2016-12-20 NOTE — Telephone Encounter (Signed)
lmtcb x1 for pt. 

## 2016-12-20 NOTE — Telephone Encounter (Signed)
Please sent script for augmentin 875 mg bid for 7 days.  If not improvement, then she needs ROV.

## 2016-12-20 NOTE — Telephone Encounter (Signed)
Spoke with pt. Pt reports of prod cough green mucus, chest heaviness, chills, sweats, nasal/head congestion & throat  burning x3d Pt doing neb duoneb q4h with mild improvement.  VS please advise. Thanks.

## 2016-12-21 ENCOUNTER — Ambulatory Visit (HOSPITAL_BASED_OUTPATIENT_CLINIC_OR_DEPARTMENT_OTHER): Payer: Medicare Other

## 2016-12-21 VITALS — BP 123/73 | HR 106 | Temp 98.8°F | Resp 20

## 2016-12-21 DIAGNOSIS — J209 Acute bronchitis, unspecified: Secondary | ICD-10-CM | POA: Diagnosis not present

## 2016-12-21 DIAGNOSIS — D509 Iron deficiency anemia, unspecified: Secondary | ICD-10-CM | POA: Diagnosis not present

## 2016-12-21 MED ORDER — PREDNISONE 20 MG PO TABS: 40.0000 mg | ORAL_TABLET | Freq: Every day | ORAL | 0 refills | Status: AC

## 2016-12-21 MED ORDER — SODIUM CHLORIDE 0.9 % IV SOLN
Freq: Once | INTRAVENOUS | Status: AC
  Administered 2016-12-21: 15:00:00 via INTRAVENOUS

## 2016-12-21 MED ORDER — SODIUM CHLORIDE 0.9 % IV SOLN
510.0000 mg | Freq: Once | INTRAVENOUS | Status: AC
  Administered 2016-12-21: 510 mg via INTRAVENOUS
  Filled 2016-12-21: qty 17

## 2016-12-21 NOTE — ED Provider Notes (Signed)
[facility] DEPT [facility] Provider Note   CSN: [id number] Arrival date & time: 12/20/16  2150     History   Chief Complaint Chief Complaint  Patient presents with  . Cough    HPI Cheyenne Arias is a 41 y.o. female.  HPI Patient is a 41 year old female with a history of asthma presents emergency department with ongoing productive cough over the past 3 days.  Her pulmonologist called in a prescription for amoxicillin today and she took her first dose.  She is to nebulized treatment 3 hours ago with some improvement.  She reports ongoing productive cough and is requesting a work note.  She feels that she may benefit from a course of prednisone.  No documented fever but is having chills at home.  Denies abdominal pain.  No chest pain.  Denies nausea vomiting or diarrhea.  No orthopnea.  Symptoms are mild in severity   Past Medical History:  Diagnosis Date  . Allergy    seasonal  . Anemia    goes to [facility], [facility]  . Anxiety   . Arthritis   . Asthma   . Cancer [facility])    Cervical cancer  . Depression   . Fatty liver disease, nonalcoholic   . Fibromyalgia   . Fibromyalgia   . Food poisoning   . GERD (gastroesophageal reflux disease)   . 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 Hypertension   . Lumbar radiculopathy   . Migraines   . Neuropathy   . Obesity   . OSA (obstructive sleep apnea)   . OSA on CPAP   . Peripheral neuropathy   . Shortness of breath dyspnea   . Spinal stenosis     Patient Active Problem List   Diagnosis Date Noted  . Rectal bleeding 01/02/2016  . Menorrhagia with regular cycle 01/02/2016  . BV (bacterial vaginosis) 10/23/2015  . Pelvic pain in female 10/23/2015  . CAP (community acquired pneumonia) 07/25/2015  . Asthma, chronic 07/02/2015  . OSA (obstructive sleep apnea) 07/02/2015  . Hypokalemia 07/02/2015  . Sepsis due to pneumonia ([place]) 07/02/2015  . Leukocytosis 07/02/2015  . IDA (iron deficiency anemia) 07/02/2015  . Benign  essential HTN 07/12/2011  . Fibromyalgia 07/26/2009    Past Surgical History:  Procedure Laterality Date  . CARPAL TUNNEL RELEASE    . [place]   . CESAREAN SECTION  11/28/2011   Procedure: CESAREAN SECTION;  Surgeon: Woodroe Mode, MD;  Location: [facility];  Service: Obstetrics;  Laterality: N/A;  . COLPOSCOPY    . HYSTEROSCOPY W/D&C N/A 08/17/2015   Procedure: DILATATION AND CURETTAGE /HYSTEROSCOPY;  Surgeon: Ena Dawley, MD;  Location: [facility];  Service: Gynecology;  Laterality: N/A;  . MANDIBLE SURGERY  2008  . WISDOM TOOTH EXTRACTION      OB History    Gravida Para Term Preterm AB Living   3 3 3     3    SAB TAB Ectopic Multiple Live Births           3       Home Medications    Prior to Admission medications   Medication Sig Start Date End Date Taking? Authorizing Provider  albuterol (PROAIR HFA) 108 (90 Base) MCG/ACT inhaler Inhale 2 puffs into the lungs every 6 (six) hours as needed for wheezing or shortness of breath. 05/28/16   Chesley Mires, MD  amLODipine (NORVASC) 10 MG tablet Take 10 mg by mouth daily.  [provider]  amoxicillin-clavulanate (AUGMENTIN) 875-125 MG tablet Take 1 tablet by mouth 2 (two) times daily. 12/20/16   Chesley Mires, MD  ARIPiprazole (ABILIFY) 2 MG tablet Take 1 tablet (2 mg total) by mouth daily. 10/19/16   Merian Capron, MD  budesonide (PULMICORT) 0.5 MG/2ML nebulizer solution Take 2 mLs (0.5 mg total) by nebulization 2 (two) times daily. Dx Code: Chronic Asthma J45.909 11/05/16   Chesley Mires, MD  clonazePAM (KLONOPIN) 0.5 MG tablet Take 1 tablet (0.5 mg total) by mouth daily as needed for anxiety. 10/19/16   Merian Capron, MD  cyclobenzaprine (FLEXERIL) 10 MG tablet Take 10 mg by mouth 3 (three) times daily as needed for muscle spasms.     [provider]  doxycycline (VIBRAMYCIN) 100 MG capsule Take 100 mg by mouth 2 (two) times daily as needed (skin flare up).     [provider]  ferumoxytol  Shirlean Kelly) 510 MG/17ML SOLN injection Inject 510 mg into the vein every 3 (three) months.     [provider]  FLUoxetine (PROZAC) 20 MG capsule Take 1 capsule (20 mg total) by mouth daily. 10/19/16   Merian Capron, MD  fluticasone (FLONASE) 50 MCG/ACT nasal spray PLACE 2 SPRAYS INTO BOTH NOSTRILS 2 (TWO) TIMES DAILY. 04/12/16   Parrett, Fonnie Mu, NP  furosemide (LASIX) 80 MG tablet Take 80 mg by mouth 2 (two) times daily.    [provider]  ibuprofen (ADVIL,MOTRIN) 800 MG tablet Take 800 mg by mouth 2 (two) times daily. 08/02/16   [provider]  ipratropium-albuterol (DUONEB) 0.5-2.5 (3) MG/3ML SOLN TAKE 3 MLS BY NEBULIZATION EVERY 4 HOURS AS NEEDED (FOR WHEEZING/SHORTNESS OF BREATH). 11/05/16   Chesley Mires, MD  loratadine (CLARITIN) 10 MG tablet TAKE 1 TABLET BY MOUTH EVERY DAY 10/18/16   Chesley Mires, MD  metoprolol tartrate (LOPRESSOR) 25 MG tablet Take 25 mg by mouth 2 (two) times daily.     [provider]  montelukast (SINGULAIR) 10 MG tablet TAKE 1 TABLET BY MOUTH AT BEDTIME 10/18/16   Parrett, Tammy S, NP  Multiple Vitamins-Minerals (MULTIVITAMIN WITH MINERALS) tablet Take 1 tablet by mouth daily.    [provider]  oxyCODONE (ROXICODONE) 15 MG immediate release tablet Take 15 mg by mouth every 6 (six) hours as needed for pain.    [provider]  OXYGEN Inhale 2 L into the lungs at bedtime.    [provider]  pantoprazole (PROTONIX) 40 MG tablet TAKE 1 TABLET (40 MG TOTAL) BY MOUTH DAILY. 05/10/16   Chesley Mires, MD  potassium chloride SA (K-DUR,KLOR-CON) 20 MEQ tablet Take 20 mEq by mouth 2 (two) times daily.    [provider]  potassium chloride SA (K-DUR,KLOR-CON) 20 MEQ tablet Take 1 tablet (20 mEq total) by mouth daily. 08/06/16   Tresea Mall, CNM  predniSONE (DELTASONE) 20 MG tablet Take 2 tablets (40 mg total) by mouth daily. 12/21/16 12/25/16  Jola Schmidt, MD  pregabalin (LYRICA) 150 MG capsule Take 150 mg by  mouth 2 (two) times daily. Takes with a 75mg  tab for total of 225mg .    [provider]  pregabalin (LYRICA) 75 MG capsule Take 225 mg by mouth 2 (two) times daily. Takes 150 mg and 75 mg to equal 225 mg twice daily.    [provider]  rizatriptan (MAXALT) 10 MG tablet Take 10 mg by mouth as needed for migraine. May repeat in 2 hours if needed    [provider]  topiramate (  TOPAMAX) 100 MG tablet Take 100 mg by mouth 2 (two) times daily.    [provider]  triamcinolone cream (KENALOG) 0.1 % Apply 1 application topically daily as needed (rash).  07/23/16   [provider]    Family History Family History  Problem Relation Age of Onset  . Hypertension Mother   . Sickle cell trait Maternal Aunt   . Sickle cell anemia Other   . Schizophrenia Son   . Depression Daughter     Social History Social History  Substance Use Topics  . Smoking status: Never Smoker  . Smokeless tobacco: Never Used     Comment: Pt was exposed to 2nd hand smoke at work years ago.  . Alcohol use No     Allergies   Cymbalta [duloxetine hcl]; Latex; and Morphine and related   Review of Systems Review of Systems  All other systems reviewed and are negative.    Physical Exam Updated Vital Signs BP (!) 150/100 (BP Location: Left Wrist)   Pulse 100   Temp 98.6 F (37 C) (Oral)   Resp 20   Ht 5\' 2"  (1.575 m)   Wt 127.5 kg (281 lb)   LMP 12/03/2016   SpO2 100%   BMI 51.40 kg/m   Physical Exam  Constitutional: She is oriented to person, place, and time. She appears well-developed and well-nourished. No distress.  HENT:  Head: Normocephalic and atraumatic.  Eyes: EOM are normal.  Neck: Normal range of motion.  Cardiovascular: Normal rate and regular rhythm.   Pulmonary/Chest: Effort normal and breath sounds normal. She has no wheezes.  Abdominal: Soft. She exhibits no distension. There is no tenderness.  Musculoskeletal: Normal range of motion.    Neurological: She is alert and oriented to person, place, and time.  Skin: Skin is warm and dry.  Psychiatric: She has a normal mood and affect. Judgment normal.  Nursing note and vitals reviewed.    ED Treatments / Results  Labs (all labs ordered are listed, but only abnormal results are displayed) Labs Reviewed - No data to display  EKG  EKG Interpretation  Date/Time:  Thursday December 20 2016 22:10:43 EDT Ventricular Rate:  96 PR Interval:  182 QRS Duration: 78 QT Interval:  372 QTC Calculation: 469 R Axis:   43 Text Interpretation:  Normal sinus rhythm Cannot rule out Anterior infarct , age undetermined Abnormal ECG No significant change was found Confirmed by Jola Schmidt [id number]) on 12/20/2016 11:58:21 PM       Radiology Dg Chest 2 View  Result Date: 12/20/2016 CLINICAL DATA:  Cough. Chest pain. Shortness of breath. Symptoms for 2 days. EXAM: CHEST  2 VIEW COMPARISON:  Radiographs 12/27/2015 FINDINGS: The cardiomediastinal contours are unchanged with mild cardiomegaly. Mild central bronchial thickening. Pulmonary vasculature is normal. No consolidation, pleural effusion, or pneumothorax. No acute osseous abnormalities are seen. IMPRESSION: 1. Mild central bronchial thickening suggesting bronchitis or asthma. 2. Mild cardiomegaly. Electronically Signed   By: Jeb Levering M.D.   On: 12/20/2016 22:30    Procedures Procedures (including critical care time)  Medications Ordered in ED Medications  albuterol (PROVENTIL HFA;VENTOLIN HFA) 108 (90 Base) MCG/ACT inhaler 2 puff (not administered)  predniSONE (DELTASONE) tablet 60 mg (not administered)  acetaminophen (TYLENOL) tablet 650 mg (not administered)     Initial Impression / Assessment and Plan / ED Course  I have reviewed the triage vital signs and the nursing notes.  Pertinent labs & imaging results that were available during my care of  the patient were reviewed by me and considered in my medical decision  making (see chart for details).     Bronchodilators for cough.  No increased work of breathing.  No hypoxia.  Patient will be given a short course of prednisone.  She be discharged home with an albuterol MDI to help with her cough.  She'll continue the amoxicillin at home.  I recommended close follow-up with her pulmonologist.  She understands return to the ER for new or worsening symptoms  Final Clinical Impressions(s) / ED Diagnoses   Final diagnoses:  Acute bronchitis with bronchospasm    New Prescriptions New Prescriptions   PREDNISONE (DELTASONE) 20 MG TABLET    Take 2 tablets (40 mg total) by mouth daily.     Jola Schmidt, MD 12/21/16 [phone]

## 2016-12-21 NOTE — Patient Instructions (Signed)

## 2016-12-29 ENCOUNTER — Other Ambulatory Visit: Payer: Self-pay | Admitting: Adult Health

## 2016-12-29 ENCOUNTER — Other Ambulatory Visit: Payer: Self-pay | Admitting: Pulmonary Disease

## 2016-12-29 DIAGNOSIS — D259 Leiomyoma of uterus, unspecified: Secondary | ICD-10-CM

## 2016-12-29 DIAGNOSIS — N390 Urinary tract infection, site not specified: Secondary | ICD-10-CM

## 2016-12-29 DIAGNOSIS — R110 Nausea: Secondary | ICD-10-CM

## 2016-12-31 DIAGNOSIS — B009 Herpesviral infection, unspecified: Secondary | ICD-10-CM | POA: Insufficient documentation

## 2017-01-11 ENCOUNTER — Other Ambulatory Visit: Payer: Self-pay

## 2017-01-11 ENCOUNTER — Other Ambulatory Visit: Payer: Self-pay | Admitting: Hematology and Oncology

## 2017-01-11 DIAGNOSIS — D509 Iron deficiency anemia, unspecified: Secondary | ICD-10-CM

## 2017-01-15 ENCOUNTER — Telehealth: Payer: Self-pay | Admitting: Pulmonary Disease

## 2017-01-15 NOTE — Telephone Encounter (Signed)
Note has been added to her appt notes. Will also route to [facility] so that she is aware.

## 2017-01-16 ENCOUNTER — Ambulatory Visit: Payer: Self-pay | Admitting: Pulmonary Disease

## 2017-01-18 ENCOUNTER — Ambulatory Visit (INDEPENDENT_AMBULATORY_CARE_PROVIDER_SITE_OTHER): Payer: 59 | Admitting: Pulmonary Disease

## 2017-01-18 ENCOUNTER — Ambulatory Visit (HOSPITAL_COMMUNITY): Payer: Self-pay | Admitting: Psychiatry

## 2017-01-18 ENCOUNTER — Encounter: Payer: Self-pay | Admitting: Pulmonary Disease

## 2017-01-18 VITALS — BP 138/86 | HR 108 | Ht 62.0 in | Wt 283.0 lb

## 2017-01-18 DIAGNOSIS — G4733 Obstructive sleep apnea (adult) (pediatric): Secondary | ICD-10-CM | POA: Diagnosis not present

## 2017-01-18 DIAGNOSIS — Z6841 Body Mass Index (BMI) 40.0 and over, adult: Secondary | ICD-10-CM

## 2017-01-18 DIAGNOSIS — E662 Morbid (severe) obesity with alveolar hypoventilation: Secondary | ICD-10-CM | POA: Diagnosis not present

## 2017-01-18 DIAGNOSIS — Z01811 Encounter for preprocedural respiratory examination: Secondary | ICD-10-CM

## 2017-01-18 DIAGNOSIS — J9611 Chronic respiratory failure with hypoxia: Secondary | ICD-10-CM | POA: Diagnosis not present

## 2017-01-18 DIAGNOSIS — Z23 Encounter for immunization: Secondary | ICD-10-CM

## 2017-01-18 DIAGNOSIS — J4540 Moderate persistent asthma, uncomplicated: Secondary | ICD-10-CM | POA: Diagnosis not present

## 2017-01-18 DIAGNOSIS — Z9989 Dependence on other enabling machines and devices: Secondary | ICD-10-CM | POA: Diagnosis not present

## 2017-01-18 NOTE — Progress Notes (Signed)
Current Outpatient Prescriptions on File Prior to Visit  Medication Sig  . albuterol (PROAIR HFA) 108 (90 Base) MCG/ACT inhaler Inhale 2 puffs into the lungs every 6 (six) hours as needed for wheezing or shortness of breath.  [name] amLODipine (NORVASC) 10 MG tablet Take 10 mg by mouth daily.  . ARIPiprazole (ABILIFY) 2 MG tablet Take 1 tablet (2 mg total) by mouth daily.  . clonazePAM (KLONOPIN) 0.5 MG tablet Take 1 tablet (0.5 mg total) by mouth daily as needed for anxiety.  . cyclobenzaprine (FLEXERIL) 10 MG tablet Take 10 mg by mouth 3 (three) times daily as needed for muscle spasms.   [name] doxycycline (VIBRAMYCIN) 100 MG capsule Take 100 mg by mouth 2 (two) times daily as needed (skin flare up).   . ferumoxytol (FERAHEME) 510 MG/17ML SOLN injection Inject 510 mg into the vein every 3 (three) months.   [name] FLUoxetine (PROZAC) 20 MG capsule Take 1 capsule (20 mg total) by mouth daily.  . fluticasone (FLONASE) 50 MCG/ACT nasal spray PLACE 2 SPRAYS INTO BOTH NOSTRILS 2 (TWO) TIMES DAILY.  . furosemide (LASIX) 80 MG tablet Take 80 mg by mouth 2 (two) times daily.  [name] ibuprofen (ADVIL,MOTRIN) 800 MG tablet Take 800 mg by mouth 2 (two) times daily.  [name] ipratropium-albuterol (DUONEB) 0.5-2.5 (3) MG/3ML SOLN TAKE 3 MLS BY NEBULIZATION EVERY 4 HOURS AS NEEDED (FOR WHEEZING/SHORTNESS OF BREATH).  [name] loratadine (CLARITIN) 10 MG tablet TAKE 1 TABLET BY MOUTH EVERY DAY  . metoprolol tartrate (LOPRESSOR) 25 MG tablet Take 25 mg by mouth 2 (two) times daily.   . montelukast (SINGULAIR) 10 MG tablet TAKE 1 TABLET BY MOUTH AT BEDTIME  . Multiple Vitamins-Minerals (MULTIVITAMIN WITH MINERALS) tablet Take 1 tablet by mouth daily.  [name] oxyCODONE (ROXICODONE) 15 MG immediate release tablet Take 15 mg by mouth every 6 (six) hours as needed for pain.  . OXYGEN Inhale 2 L into the lungs at bedtime.  . pantoprazole (PROTONIX) 40 MG tablet TAKE 1 TABLET (40 MG TOTAL) BY MOUTH DAILY.  [name] potassium chloride SA (K-DUR,KLOR-CON) 20 MEQ  tablet Take 20 mEq by mouth 2 (two) times daily.  . potassium chloride SA (K-DUR,KLOR-CON) 20 MEQ tablet Take 1 tablet (20 mEq total) by mouth daily.  . pregabalin (LYRICA) 150 MG capsule Take 150 mg by mouth 2 (two) times daily. Takes with a 75mg  tab for total of 225mg .  . pregabalin (LYRICA) 75 MG capsule Take 225 mg by mouth 2 (two) times daily. Takes 150 mg and 75 mg to equal 225 mg twice daily.  [name] PULMICORT 0.5 MG/2ML nebulizer solution TAKE 2 MLS BY NEBULIZATION 2 TIMES DAILY. DX CODE: CHRONIC ASTHMA J45.909  . rizatriptan (MAXALT) 10 MG tablet Take 10 mg by mouth as needed for migraine. May repeat in 2 hours if needed  . topiramate (TOPAMAX) 100 MG tablet Take 100 mg by mouth 2 (two) times daily.  [name] triamcinolone cream (KENALOG) 0.1 % Apply 1 application topically daily as needed (rash).   . [DISCONTINUED] zolpidem (AMBIEN) 5 MG tablet Take 5 mg by mouth at bedtime as needed for sleep.    No current facility-administered medications on file prior to visit.      Chief Complaint  Patient presents with  . Follow-up    Pt has CPAP machine for years, it is leaking air from the back, needing new one if possible. Pt is having SOB with exertion has worsen oven last month. DME-[employer]     Sleep tests PSG 03/27/11 >>  AHI 10.9  Cardiac tests Echo 09/20/11 > >mod LVH, EF 65 to 70%, small/mod pericardial effusion  Doppler legs 09/21/11 >> no DVT  Cardiac tests CT chest 09/22/11 >> no PE, Lt base ATX/ASD, borderline cardiomegaly Spirometry 01/10/12 >> FEV1 1.72 (71%), FEV1% 77 CT sinus 02/28/12 >> clear paranasal sinuses RAST 02/28/12 >> IgE 15.6, moderate reaction to [facility] CT chest 04/03/12 >> ASD superior segment LLL, RLL ATX PFT 12/17/12 >> FEV1 2.14 (95%), FEV1% 88, TLC 2.62 (58%), DLCO 96%, no BD CPAP 05/15/15 to 06/13/15 >> used on 30 of 30 nights with average 6 hrs 55 min.  Average AHI 1 with CPAP 13 cm H2O  Past medical history HTN, HA, Anxiety, Fibromyalgia, Depression, Alpha  thalassemia, Iron deficiency anemia, Spinal stenosis  Past surgical history, Family history, Social history, Allergies reviewed  Vital Signs BP 138/86 (BP Location: Left Arm, Cuff Size: Normal)   Pulse (!) 108   Ht 5\' 2"  (1.575 m)   Wt 283 lb (128.4 kg)   SpO2 97%   BMI 51.76 kg/m   History of Present Illness Cheyenne Arias is a 41 y.o. female with asthma, OSA, OHS, and chronic respiratory failure.  She is having more back pain.  She is scheduled for surgical assessment and might need to have lumbar spine surgery.  She had bronchitis several weeks ago, but feels better now.  She is not having sinus congestion, post nasal drip, sore throat, cough, wheeze, or chest congestion.  She is having more trouble with her sleep.  She sleeps for about 4 hours at night and then wakes up.  She can't fall back to sleep.  She then feels exhausted during the day.  She will fall asleep during the day easily.  She reports compliance with CPAP and oxygen at night.  She was told she needed to re-certify for oxygen at night with CPAP.  Physical Exam  General - pleasant Eyes - pupils reactive ENT - no sinus tenderness, no oral exudate, no LAN, 2+ tonsils, MP 2 Cardiac - regular, no murmur Chest - no wheeze, rales Abd - soft, non tender Ext - no edema Skin - no rashes Neuro - normal strength Psych - normal mood   CMP Latest Ref Rng & Units 08/06/2016 12/27/2015 12/17/2015  Glucose 65 - 99 mg/dL 166(H) 115(H) 108(H)  BUN 6 - 20 mg/dL 10 10 13   Creatinine 0.44 - 1.00 mg/dL 0.75 0.68 0.67  Sodium 135 - 145 mmol/L 138 139 140  Potassium 3.5 - 5.1 mmol/L 2.7(LL) 2.8(L) 3.1(L)  Chloride 101 - 111 mmol/L 107 107 109  CO2 22 - 32 mmol/L 21(L) 26 25  Calcium 8.9 - 10.3 mg/dL 8.9 9.1 8.6(L)  Total Protein 6.5 - 8.1 g/dL 8.0 - 7.6  Total Bilirubin 0.3 - 1.2 mg/dL 0.4 - 0.4  Alkaline Phos 38 - 126 U/L 72 - 62  AST 15 - 41 U/L 31 - 46(H)  ALT 14 - 54 U/L 29 - 33    CBC Latest Ref Rng & Units 12/11/2016  12/06/2016 08/06/2016  WBC 3.9 - 10.3 10e3/uL 12.4(H) 10.7(H) 18.5(H)  Hemoglobin 11.6 - 15.9 g/dL 8.5(L) 8.6(L) 10.4(L)  Hematocrit 34.8 - 46.6 % 30.0(L) 30.3(L) 34.1(L)  Platelets 145 - 400 10e3/uL 392 396 392    Dg Chest 2 View  Result Date: 12/20/2016 CLINICAL DATA:  Cough. Chest pain. Shortness of breath. Symptoms for 2 days. EXAM: CHEST  2 VIEW COMPARISON:  Radiographs 12/27/2015 FINDINGS: The cardiomediastinal contours are unchanged with mild cardiomegaly.  Mild central bronchial thickening. Pulmonary vasculature is normal. No consolidation, pleural effusion, or pneumothorax. No acute osseous abnormalities are seen. IMPRESSION: 1. Mild central bronchial thickening suggesting bronchitis or asthma. 2. Mild cardiomegaly. Electronically Signed   By: Jeb Levering M.D.   On: 12/20/2016 22:30     Assessment/Plan  Persistent asthma. - continue singulair, pulmicort, and prn duoneb - flu shot today  Obstructive sleep apnea. - she reports compliance with CPAP and benefit - she is still having sleep difficulty and daytime sleepines - continue CPAP 13 cm H2O - will get copy of her CPAP download  Chronic hypoxic respiratory failure 2nd to obesity hypoventilation syndrome. - will arrange for overnight oximetry with CPAP and room air to requalify for nocturnal oxygen - continue 2 liters oxygen at night with CPAP  Morbid obesity. - reviewed importance of weight loss  Pre-operative respiratory assessment. - she is being assessed for lumbar spine surgery - she can proceed with surgery if needed - she needs to have close monitoring of his oxygen after surgery - she will need to were CPAP after surgery - I would anticipate she might be a difficult airway    Patient Instructions  Will call with result of CPAP download  Will arrange for overnight oxygen test with you only using CPAP  Flu shot today  Follow up in 6 months   Chesley Mires, MD [address] [facility]/Critical  Care/Sleep Pager:  [phone] 01/18/2017, 4:46 PM

## 2017-01-18 NOTE — Patient Instructions (Signed)
Will call with result of CPAP download  Will arrange for overnight oxygen test with you only using CPAP  Flu shot today  Follow up in 6 months

## 2017-01-18 NOTE — Telephone Encounter (Signed)
LVM for patient to return call seeing if she would be arriving to the appt today at 4pm.

## 2017-01-23 ENCOUNTER — Telehealth: Payer: Self-pay | Admitting: Pulmonary Disease

## 2017-01-23 ENCOUNTER — Other Ambulatory Visit: Payer: Self-pay | Admitting: Adult Health

## 2017-01-23 NOTE — Telephone Encounter (Signed)
Please let her DME know that I am aware that an ONO with CPAP will not qualify her for oxygen.  I want to do this as first step since it is less expensive and more convenient.  If her ONO with CPAP and RA is okay, then she can avoid having to go to the sleep lab for a titration study.  If her oxygen level is low, then an in lab CPAP titration study is easier to justify.

## 2017-01-23 NOTE — Telephone Encounter (Signed)
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 On medicare and Medicare Advantage/[employer] they require CPAP titration. Can we order VS? Thanks.

## 2017-01-23 NOTE — Telephone Encounter (Signed)
Spoke with Cheyenne Arias at [facility] is aware that VS wants to have patient complete ONO on CPAP to show if O2 levels stay normal with CPAP only (less expensive and more convenient for patient). If test results show low O2 levels while on CPAP then we will order official CPAP titration at sleep lab.   Cheyenne Arias verbalized her understanding of this request and will submit to her staff. Nothing more needed at this time.

## 2017-02-04 ENCOUNTER — Other Ambulatory Visit: Payer: Self-pay | Admitting: Obstetrics and Gynecology

## 2017-02-07 NOTE — Patient Instructions (Addendum)
Your procedure is scheduled on: Wednesday February 13, 2017 at 9:30 am  Enter through the [employer] [facility] at: 7:00am  Pick up the phone at the desk and dial [phone].  Call this number if you have problems the morning of surgery: [phone].  Remember: Do NOT eat food or drink any fluids after: Midnight on Tuesday February 12, 2017  Take these medicines the morning of surgery with a SIP OF WATER: Lopressor, protonix, claritin, flexeril, Norvasc, abilify, lyrica, prozac, topamax. May take klonopin if needed. May use flonase nasal spray if needed  Bring albuterol inhaler day of surgery  STOP ALL VITAMINS AND SUPPLEMENTS 1 WEEK PRIOR TO SURGERY  DO NOT SMOKE DAY OF SURGERY  Do NOT wear jewelry (body piercing), metal hair clips/bobby pins, make-up, or nail polish. Do NOT wear lotions, powders, or perfumes.  You may wear deoderant. Do NOT shave for 48 hours prior to surgery. Do NOT bring valuables to the hospital. Contacts, dentures, or bridgework may not be worn into surgery.  Have a responsible adult drive you home and stay with you for 24 hours after your procedure

## 2017-02-08 ENCOUNTER — Inpatient Hospital Stay (HOSPITAL_COMMUNITY)
Admission: RE | Admit: 2017-02-08 | Discharge: 2017-02-08 | Disposition: A | Discharge status: Home / Self Care | Payer: Self-pay | Source: Ambulatory Visit

## 2017-02-11 ENCOUNTER — Ambulatory Visit (INDEPENDENT_AMBULATORY_CARE_PROVIDER_SITE_OTHER): Payer: 59 | Admitting: Gastroenterology

## 2017-02-11 ENCOUNTER — Encounter: Payer: Self-pay | Admitting: Gastroenterology

## 2017-02-11 VITALS — BP 100/60 | HR 94 | Ht 62.0 in | Wt 283.0 lb

## 2017-02-11 DIAGNOSIS — R195 Other fecal abnormalities: Secondary | ICD-10-CM

## 2017-02-11 DIAGNOSIS — R0789 Other chest pain: Secondary | ICD-10-CM

## 2017-02-11 DIAGNOSIS — K921 Melena: Secondary | ICD-10-CM | POA: Diagnosis not present

## 2017-02-11 DIAGNOSIS — D500 Iron deficiency anemia secondary to blood loss (chronic): Secondary | ICD-10-CM

## 2017-02-11 MED ORDER — NA SULFATE-K SULFATE-MG SULF 17.5-3.13-1.6 GM/177ML PO SOLN: 1.0000 | Freq: Once | ORAL | 0 refills | Status: AC

## 2017-02-11 NOTE — Patient Instructions (Signed)
If you are age 42 or older, your body mass index should be between 23-30. Your Body mass index is 51.76 kg/m. If this is out of the aforementioned range listed, please consider follow up with your Primary Care Provider.  If you are age 76 or younger, your body mass index should be between 19-25. Your Body mass index is 51.76 kg/m. If this is out of the aformentioned range listed, please consider follow up with your Primary Care Provider.   We have sent the following medications to your pharmacy for you to pick up at your convenience:  [place] have been scheduled for a colonoscopy. Please follow written instructions given to you at your visit today.  Please pick up your prep supplies at the pharmacy within the next 1-3 days. If you use inhalers (even only as needed), please bring them with you on the day of your procedure. Your physician has requested that you go to www.[email or web address].com and enter the access code given to you at your visit today. This web site gives a general overview about your procedure. However, you should still follow specific instructions given to you by our office regarding your preparation for the procedure.  Thank you.

## 2017-02-11 NOTE — Patient Instructions (Addendum)
Your procedure is scheduled on:   Wednesday, Oct 31  Enter through the [employer] [facility] at: 8 am  Pick up the phone at the desk and dial [phone].  Call this number if you have problems the morning of surgery: [phone].  Remember: Do NOT eat food or drink clear liquids (including water) after midnight Tuesday  Take these medicines the morning of surgery with a SIP OF WATER: amlodipine, prozac, metoprolol, lyrica and topamax.  Ok to take klonopin if needed and  ok to use flonase nasal spray if needed.  Bring albuterol inhaler with you on day of surgery.  Bring CPAP with you on day of surgery.  Do NOT wear jewelry (body piercing), metal hair clips/bobby pins, make-up, or nail polish. Do NOT wear lotions, powders, or perfumes.  You may wear deoderant. Do NOT shave for 48 hours prior to surgery. Do NOT bring valuables to the hospital.  Have a responsible adult drive you home and stay with you for 24 hours after your procedure.  Home with Daughter [name] cell [phone]

## 2017-02-11 NOTE — Progress Notes (Signed)
History of Present Illness: This is a 41 year old female referred by Heath Lark, MD for the evaluation of iron deficiency anemia, hematochezia and alternating bowel habits.  She has menorrhagia with severe iron deficiency anemia and reactive thrombocytosis.  She is followed by Dr. Alvy Bimler.  She has received intermittent intravenous iron infusions as she could not tolerate oral iron supplements.  She relates an intermittent history of mild epistaxis.  She relates episodes of rectal bleeding for several years occurring about once or twice per month with bowel movements.  She was evaluated in the ED in 12/2015 for heavier bleeding with findings of heme + stool and anemia.  No prior colonoscopy or EGD.  She relates alternating bowel habits between constipation, looser stools and normal stools.  Variation in bowel habits is often associated with periumbilical and mid abdominal crampy pain.  This pattern has been present for a long period of time and has not changed.  She related intermittent pain below her left breast and not related to any digestive function.  Denies weight loss, abdominal pain, change in stool caliber, melena, nausea, vomiting, dysphagia, reflux symptoms.     Allergies  Allergen Reactions  . Cymbalta [Duloxetine Hcl] Other (See Comments)    Head felt cold and tight, loss of conciousness  . Latex Itching, Swelling and Other (See Comments)    Reaction:  Blisters   . Morphine And Related Itching   Outpatient Medications Prior to Visit  Medication Sig Dispense Refill  . albuterol (PROAIR HFA) 108 (90 Base) MCG/ACT inhaler Inhale 2 puffs into the lungs every 6 (six) hours as needed for wheezing or shortness of breath. 8.5 Inhaler 2  . amLODipine (NORVASC) 10 MG tablet Take 10 mg by mouth daily.    . ARIPiprazole (ABILIFY) 2 MG tablet Take 1 tablet (2 mg total) by mouth daily. 30 tablet 2  . clonazePAM (KLONOPIN) 0.5 MG tablet Take 1 tablet (0.5 mg total) by mouth daily as needed for  anxiety. 30 tablet 2  . cyclobenzaprine (FLEXERIL) 10 MG tablet Take 10 mg by mouth 3 (three) times daily.     . ferumoxytol (FERAHEME) 510 MG/17ML SOLN injection Inject 510 mg into the vein every 3 (three) months.     [name] FLUoxetine (PROZAC) 20 MG capsule Take 1 capsule (20 mg total) by mouth daily. 30 capsule 2  . fluticasone (FLONASE) 50 MCG/ACT nasal spray PLACE 2 SPRAYS INTO BOTH NOSTRILS 2 (TWO) TIMES DAILY. 16 g 5  . furosemide (LASIX) 80 MG tablet Take 80 mg by mouth 2 (two) times daily.    [name] ibuprofen (ADVIL,MOTRIN) 800 MG tablet Take 800 mg by mouth every 8 (eight) hours as needed for headache or moderate pain.     [name] ipratropium-albuterol (DUONEB) 0.5-2.5 (3) MG/3ML SOLN TAKE 3 MLS BY NEBULIZATION EVERY 4 HOURS AS NEEDED (FOR WHEEZING/SHORTNESS OF BREATH). (Patient taking differently: TAKE 3 MLS BY NEBULIZATION EVERY 6 HOURS AS NEEDED (FOR WHEEZING/SHORTNESS OF BREATH).) 360 mL 0  . loratadine (CLARITIN) 10 MG tablet TAKE 1 TABLET BY MOUTH EVERY DAY (Patient taking differently: TAKE 10 MG BY MOUTH EVERY DAY) 30 tablet 2  . metoprolol tartrate (LOPRESSOR) 25 MG tablet Take 25 mg by mouth 2 (two) times daily.     . montelukast (SINGULAIR) 10 MG tablet TAKE 1 TABLET BY MOUTH AT BEDTIME (Patient taking differently: TAKE 10 MG BY MOUTH AT BEDTIME) 30 tablet 4  . Multiple Vitamins-Minerals (MULTIVITAMIN WITH MINERALS) tablet Take 1 tablet by mouth daily.    [name]  oxyCODONE (ROXICODONE) 15 MG immediate release tablet Take 15 mg by mouth every 6 (six) hours as needed for pain.    . OXYGEN Inhale 2 L into the lungs at bedtime.    . pantoprazole (PROTONIX) 40 MG tablet TAKE 1 TABLET BY MOUTH EVERY DAY *NEEDS OFFICE VISIT (Patient taking differently: Take 40 mg by mouth once daily) 90 tablet 1  . potassium chloride SA (K-DUR,KLOR-CON) 20 MEQ tablet Take 1 tablet (20 mEq total) by mouth daily. (Patient taking differently: Take 20 mEq by mouth 2 (two) times daily. ) 7 tablet 0  . pregabalin (LYRICA) 300 MG  capsule Take 300 mg by mouth 2 (two) times daily.     [name] PULMICORT 0.5 MG/2ML nebulizer solution TAKE 2 MLS BY NEBULIZATION 2 TIMES DAILY. DX CODE: CHRONIC ASTHMA J45.909 120 mL 0  . rizatriptan (MAXALT) 10 MG tablet Take 10 mg by mouth as needed for migraine. May repeat in 2 hours if needed    . topiramate (TOPAMAX) 100 MG tablet Take 200 mg by mouth 2 (two) times daily.     [name] triamcinolone cream (KENALOG) 0.1 % Apply 1 application topically daily as needed (for rash).      No facility-administered medications prior to visit.    Past Medical History:  Diagnosis Date  . Allergy    seasonal  . Anemia    goes to [facility], ferahem  . Anxiety   . Arthritis   . Asthma   . Cancer [facility]) 2013   Cervical cancer  . Depression   . Fatty liver disease, nonalcoholic   . Fibromyalgia   . Fibromyalgia   . Food poisoning   . GERD (gastroesophageal reflux disease)   . 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 Hypertension   . Lumbar radiculopathy   . Migraines   . Neuropathy   . Obesity   . OSA (obstructive sleep apnea)   . OSA on CPAP   . Peripheral neuropathy   . Shortness of breath dyspnea   . Spinal stenosis    Past Surgical History:  Procedure Laterality Date  . CARPAL TUNNEL RELEASE    . [place]   . CESAREAN SECTION  11/28/2011   Procedure: CESAREAN SECTION;  Surgeon: Woodroe Mode, MD;  Location: [facility];  Service: Obstetrics;  Laterality: N/A;  . COLPOSCOPY    . HYSTEROSCOPY W/D&C N/A 08/17/2015   Procedure: DILATATION AND CURETTAGE /HYSTEROSCOPY;  Surgeon: Ena Dawley, MD;  Location: [facility];  Service: Gynecology;  Laterality: N/A;  . MANDIBLE SURGERY  2008  . WISDOM TOOTH EXTRACTION     Social History   Social History  . Marital status: Single    Spouse name: N/A  . Number of children: 3  . Years of education: N/A   Social History Main Topics  . Smoking status: Never Smoker  . Smokeless tobacco: Never Used     Comment: Pt was exposed to 2nd  hand smoke at work years ago.  . Alcohol use No  . Drug use: No  . Sexual activity: Yes    Partners: Male    Birth control/ protection: None     Comment: 2 months ago   Other Topics Concern  . None   Social History Narrative  . None   Family History  Problem Relation Age of Onset  . Hypertension Mother   . Sickle cell trait Maternal Aunt   . Sickle cell anemia Other   .  Schizophrenia Son   . Depression Daughter   . Kidney disease Father        transplant required  . Stomach cancer Neg Hx   . Colon cancer Neg Hx        Review of Systems: Pertinent positive and negative review of systems were noted in the above HPI section. All other review of systems were otherwise negative.    Physical Exam: General: Well developed, well nourished, morbidly obese, no acute distress Head: Normocephalic and atraumatic Eyes:  sclerae anicteric, EOMI Ears: Normal auditory acuity Mouth: No deformity or lesions Neck: Supple, no masses or thyromegaly Lungs: Clear throughout to auscultation Chest: She localizes her left chest pain to her lower left chest wall, although no tenderness today Heart: Regular rate and rhythm; no murmurs, rubs or bruits Abdomen: Soft, non tender and non distended. No masses, hepatosplenomegaly or hernias noted. Normal Bowel sounds Rectal: deferred to colonoscopy Musculoskeletal: Symmetrical with no gross deformities  Skin: No lesions on visible extremities Pulses:  Normal pulses noted Extremities: No clubbing, cyanosis, edema or deformities noted Neurological: Alert oriented x 4, grossly nonfocal Cervical Nodes:  No significant cervical adenopathy Inguinal Nodes: No significant inguinal adenopathy Psychological:  Alert and cooperative. Normal mood and affect  Assessment and Recommendations:  1. Iron deficiency anemia.  Likely secondary to menorrhagia.  R/O colorectal sources of blood loss.  Uterine ablation and D&C scheduled for 10/31.  2. Intermittent  hematochezia.  Rule out hemorrhoids, colorectal neoplasms, AVMs and other disorders. Schedule colonoscopy. The risks (including bleeding, perforation, infection, missed lesions, medication reactions and possible hospitalization or surgery if complications occur), benefits, and alternatives to colonoscopy with possible biopsy and possible polypectomy were discussed with the patient and they consent to proceed.   3.  Variable bowel habits with occasional mid abdominal pain. Suspected IBS. Further evaluation with colonoscopy.  Increase daily water and daily fiber intake.  MiraLAX daily for mild constipation.  Consider addition of antispasmodic.  4. Left chest pain. This is not GI related. Further evaluation with PCP.   5. OSA on CPAP.   6. Obesity.  BMI=51.76.  Long-term weight loss, carb modified diet supervised by her PCP.  7. Fatty liver.  Long-term weight loss, carb modified diet supervised by her PCP.    cc: Heath Lark, MD [address] [place], [place] [id number]

## 2017-02-11 NOTE — H&P (Signed)
Admission History and Physical Exam for a Gynecology Patient  Cheyenne Arias is a 41 y.o. female, G3P3003, who presents for hysteroscopy, dilation and curettage, and NovaSure ablation of the endometrium. She has a history of menorrhagia and anemia.  A D&C was performed in May 2017.  Pathology report was benign.  The patient has required IV iron transfusions in the past.  A D&C was performed in May 2017. Pathology report was benign.She has been followed at the [facility] and Gynecology division of [facility].  OB History    Gravida Para Term Preterm AB Living   3 3 3     3    SAB TAB Ectopic Multiple Live Births           3      Past Medical History:  Diagnosis Date  . Allergy    seasonal  . Anemia    goes to [facility], ferahem  . Anxiety   . Arthritis   . Asthma   . Cancer [facility]) 2013   Cervical cancer  . Depression   . Fatty liver disease, nonalcoholic   . Fibromyalgia   . Fibromyalgia   . Food poisoning   . GERD (gastroesophageal reflux disease)   . 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 Hypertension   . Lumbar radiculopathy   . Migraines   . Neuropathy   . Obesity   . OSA (obstructive sleep apnea)   . OSA on CPAP   . Peripheral neuropathy   . Shortness of breath dyspnea   . Spinal stenosis    Medications:  amLODIPine 10 mg tablet  TAKE 1 TABLET BY MOUTH ONCE DAILY FOR HIGH BLOOD PRESSURE  ARIPiprazole 2 mg tablet  TAKE 1 TABLET BY MOUTH EVERY DAY  clonazePAM 0.5 mg tablet  TAKE 1 TABLET TWICE A DAY AS NEEDED FOR ANXIETY  cyclobenzaprine 10 mg tablet  TAKE 1 TABLET BY MOUTH 3 TIMES A DAY AS NEEDED  ferumoxytol  q90m treatment  FLUoxetine 20 mg capsule  TAKE ONE CAPSULE BY MOUTH EVERY DAY  fluticasone 50 mcg/actuation nasal spray,suspension  PLACE 2 SPRAYS INTO BOTH NOSTRILS 2 (TWO) TIMES DAILY.  furosemide 80 mg tablet  TAKE 1 TO 2 TABLETS DAILY AS NEEDED FOR FLUID  ibuprofen 800 mg tablet  TAKE 1 TABLET BY MOUTH 3  TIMES A DAY AS NEEDED  ipratropium-albuterol 0.5 mg-3 mg(2.5 mg base)/3 mL nebulization soln  TAKE 3 MLS BY NEBULIZATION EVERY 4 HOURS AS NEEDED (FOR WHEEZING/SHORTNESS OF BREATH).  ketorolac 10 mg tablet  TAKE 1 TABLET PC EVERY 6 HOURS x 2 days then prn-pain  ketorolac 30 mg/mL (1 mL) injection solution  1 mL IM stat  Klor-Con M20 mEq tablet,extended release  TAKE 1 TABLET EVERY DAY  loratadine 10 mg tablet  TAKE 1 TABLET BY MOUTH AT BEDTIME  Lyrica 300 mg capsule  Take 1 capsule twice a day by oral route.  metoprolol tartrate 25 mg tablet  TAKE 1 TABLET BY MOUTH TWICE A DAY FOR HIGH BLOOD PRESSURE  montelukast 10 mg tablet  TAKE 1 TABLET (10 MG TOTAL) BY MOUTH AT BEDTIME.  oxyCODONE 15 mg tablet  TAKE 1 TABLET BY MOUTH 4 TIMES DAILY AS NEEDED  oxyCODONE 5 mg tablet  TAKE 1 TABLET BY MOUTH 4 TIMES DAILY AS NEEDED  pantoprazole 40 mg tablet,delayed release  TAKE 1 TABLET BY MOUTH EVERY DAY *NEEDS OFFICE VISIT  ProAir HFA 90 mcg/actuation aerosol inhaler  INHALE  2 PUFFS INTO THE LUNGS EVERY 6 HOURS AS NEEDED FOR WHEEZING OR SHORTNESS OF BREATH.  Pulmicort 0.5 mg/2 mL suspension for nebulization  TAKE 2 MLS BY NEBULIZATION 2 TIMES DAILY. DX CODE: CHRONIC ASTHMA J45.909  rizatriptan 10 mg tablet  TAKE AS DIRECTED  topiramate 100 mg tablet  TAKE 1 TABLET TWICE A DAY  triamcinolone acetonide 0.1 % topical cream  prn  valACYclovir 500 mg tablet  Take 1 tablet every day by oral route.    Past Surgical History:  Procedure Laterality Date  . CARPAL TUNNEL RELEASE    . [place]   . CESAREAN SECTION  11/28/2011   Procedure: CESAREAN SECTION;  Surgeon: Woodroe Mode, MD;  Location: [facility];  Service: Obstetrics;  Laterality: N/A;  . COLPOSCOPY    . HYSTEROSCOPY W/D&C N/A 08/17/2015   Procedure: DILATATION AND CURETTAGE /HYSTEROSCOPY;  Surgeon: Ena Dawley, MD;  Location: [facility];  Service: Gynecology;  Laterality: N/A;  . MANDIBLE SURGERY  2008  . WISDOM  TOOTH EXTRACTION      Allergies  Allergen Reactions  . Cymbalta [Duloxetine Hcl] Other (See Comments)    Head felt cold and tight, loss of conciousness  . Latex Itching, Swelling and Other (See Comments)    Reaction:  Blisters   . Morphine And Related Itching    Family History: family history includes Depression in her daughter; Hypertension in her mother; Kidney disease in her father; Schizophrenia in her son; Sickle cell anemia in her other; Sickle cell trait in her maternal aunt.  Social History:  reports that she has never smoked. She has never used smokeless tobacco. She reports that she does not drink alcohol or use drugs.  Review of systems: See HPI.  Admission Physical Exam:    BMI = 52.  There were no vitals taken for this visit.  HEENT:                 Within normal limits Chest:                   Clear Heart:                    Regular rate and rhythm Breasts:                No masses, skin changes, bleeding, or discharge present Abdomen:             Nontender, no masses Extremities:          Grossly normal Neurologic exam: Grossly normal  Pelvic exam:  External genitalia: normal general appearance Vaginal: normal without tenderness, induration or masses Cervix: normal appearance Adnexa: normal bimanual exam Uterus: NSSC   The pelvic exam is compromised by the patient's BMI of 52.[name]  CBC    Component Value Date/Time   WBC 12.4 (H) 12/11/2016 1213   WBC 18.5 (H) 08/06/2016 2057   RBC 4.82 12/11/2016 1213   RBC 5.00 08/06/2016 2057   HGB 8.5 (L) 12/11/2016 1213   HCT 30.0 (L) 12/11/2016 1213   PLT 392 12/11/2016 1213   MCV 62.2 (L) 12/11/2016 1213   MCH 17.6 (L) 12/11/2016 1213   MCH 20.8 (L) 08/06/2016 2057   MCHC 28.3 (L) 12/11/2016 1213   MCHC 30.5 08/06/2016 2057   RDW 18.3 (H) 12/11/2016 1213   LYMPHSABS 2.5 12/11/2016 1213   MONOABS 0.5 12/11/2016 1213   EOSABS 0.2 12/11/2016 1213   BASOSABS 0.0 12/11/2016 1213  Assessment:  Menorrhagia  Severe anemia  BMI equals 52  Plan:  Hysteroscopy, D&C, NovaSure ablation of the endometrium   Obi Scrima V 02/11/2017

## 2017-02-12 ENCOUNTER — Encounter (HOSPITAL_COMMUNITY): Payer: Self-pay

## 2017-02-12 ENCOUNTER — Encounter (HOSPITAL_COMMUNITY)
Admission: RE | Admit: 2017-02-12 | Discharge: 2017-02-12 | Disposition: A | Discharge status: Home / Self Care | Payer: 59 | Source: Ambulatory Visit | Attending: Obstetrics and Gynecology | Admitting: Obstetrics and Gynecology

## 2017-02-12 ENCOUNTER — Other Ambulatory Visit (HOSPITAL_COMMUNITY): Payer: Self-pay

## 2017-02-12 DIAGNOSIS — G43909 Migraine, unspecified, not intractable, without status migrainosus: Secondary | ICD-10-CM | POA: Diagnosis not present

## 2017-02-12 DIAGNOSIS — M4800 Spinal stenosis, site unspecified: Secondary | ICD-10-CM | POA: Diagnosis not present

## 2017-02-12 DIAGNOSIS — J45909 Unspecified asthma, uncomplicated: Secondary | ICD-10-CM | POA: Diagnosis not present

## 2017-02-12 DIAGNOSIS — G4733 Obstructive sleep apnea (adult) (pediatric): Secondary | ICD-10-CM | POA: Diagnosis not present

## 2017-02-12 DIAGNOSIS — M5416 Radiculopathy, lumbar region: Secondary | ICD-10-CM | POA: Diagnosis not present

## 2017-02-12 DIAGNOSIS — M797 Fibromyalgia: Secondary | ICD-10-CM | POA: Diagnosis not present

## 2017-02-12 DIAGNOSIS — Z79891 Long term (current) use of opiate analgesic: Secondary | ICD-10-CM | POA: Diagnosis not present

## 2017-02-12 DIAGNOSIS — K219 Gastro-esophageal reflux disease without esophagitis: Secondary | ICD-10-CM | POA: Diagnosis not present

## 2017-02-12 DIAGNOSIS — N920 Excessive and frequent menstruation with regular cycle: Secondary | ICD-10-CM | POA: Diagnosis present

## 2017-02-12 DIAGNOSIS — Z8541 Personal history of malignant neoplasm of cervix uteri: Secondary | ICD-10-CM | POA: Diagnosis not present

## 2017-02-12 DIAGNOSIS — Z79899 Other long term (current) drug therapy: Secondary | ICD-10-CM | POA: Diagnosis not present

## 2017-02-12 DIAGNOSIS — F329 Major depressive disorder, single episode, unspecified: Secondary | ICD-10-CM | POA: Diagnosis not present

## 2017-02-12 DIAGNOSIS — D649 Anemia, unspecified: Secondary | ICD-10-CM | POA: Diagnosis not present

## 2017-02-12 DIAGNOSIS — Z885 Allergy status to narcotic agent status: Secondary | ICD-10-CM | POA: Diagnosis not present

## 2017-02-12 DIAGNOSIS — Z91040 Latex allergy status: Secondary | ICD-10-CM | POA: Diagnosis not present

## 2017-02-12 DIAGNOSIS — F419 Anxiety disorder, unspecified: Secondary | ICD-10-CM | POA: Diagnosis not present

## 2017-02-12 DIAGNOSIS — G629 Polyneuropathy, unspecified: Secondary | ICD-10-CM | POA: Diagnosis not present

## 2017-02-12 DIAGNOSIS — Z6841 Body Mass Index (BMI) 40.0 and over, adult: Secondary | ICD-10-CM | POA: Diagnosis not present

## 2017-02-12 DIAGNOSIS — K760 Fatty (change of) liver, not elsewhere classified: Secondary | ICD-10-CM | POA: Diagnosis not present

## 2017-02-12 DIAGNOSIS — Z7951 Long term (current) use of inhaled steroids: Secondary | ICD-10-CM | POA: Diagnosis not present

## 2017-02-12 DIAGNOSIS — I10 Essential (primary) hypertension: Secondary | ICD-10-CM | POA: Diagnosis not present

## 2017-02-12 DIAGNOSIS — Z888 Allergy status to other drugs, medicaments and biological substances status: Secondary | ICD-10-CM | POA: Diagnosis not present

## 2017-02-12 DIAGNOSIS — N858 Other specified noninflammatory disorders of uterus: Secondary | ICD-10-CM | POA: Diagnosis not present

## 2017-02-12 HISTORY — DX: Bipolar disorder, unspecified: F31.9

## 2017-02-12 HISTORY — DX: Herpesviral infection, unspecified: B00.9

## 2017-02-12 LAB — CBC
HEMATOCRIT: 32.7 % — AB (ref 36.0–46.0)
Hemoglobin: 10.1 g/dL — ABNORMAL LOW (ref 12.0–15.0)
MCH: 22.5 pg — ABNORMAL LOW (ref 26.0–34.0)
MCHC: 30.9 g/dL (ref 30.0–36.0)
MCV: 73 fL — AB (ref 78.0–100.0)
PLATELETS: 349 10*3/uL (ref 150–400)
RBC: 4.48 MIL/uL (ref 3.87–5.11)
RDW: 25.6 % — AB (ref 11.5–15.5)
WBC: 9 10*3/uL (ref 4.0–10.5)

## 2017-02-12 LAB — BASIC METABOLIC PANEL
ANION GAP: 6 (ref 5–15)
BUN: 13 mg/dL (ref 6–20)
CHLORIDE: 110 mmol/L (ref 101–111)
CO2: 22 mmol/L (ref 22–32)
Calcium: 9 mg/dL (ref 8.9–10.3)
Creatinine, Ser: 0.73 mg/dL (ref 0.44–1.00)
Glucose, Bld: 99 mg/dL (ref 65–99)
POTASSIUM: 3.6 mmol/L (ref 3.5–5.1)
SODIUM: 138 mmol/L (ref 135–145)

## 2017-02-13 ENCOUNTER — Telehealth: Payer: Self-pay

## 2017-02-13 ENCOUNTER — Encounter (HOSPITAL_COMMUNITY): Payer: Self-pay | Admitting: Emergency Medicine

## 2017-02-13 ENCOUNTER — Ambulatory Visit (HOSPITAL_COMMUNITY): Payer: 59 | Admitting: Anesthesiology

## 2017-02-13 ENCOUNTER — Encounter (HOSPITAL_COMMUNITY)
Admission: RE | Disposition: A | Discharge status: Home / Self Care | Payer: Self-pay | Source: Ambulatory Visit | Attending: Obstetrics and Gynecology

## 2017-02-13 ENCOUNTER — Ambulatory Visit (HOSPITAL_COMMUNITY)
Admission: RE | Admit: 2017-02-13 | Discharge: 2017-02-13 | Disposition: A | Discharge status: Home / Self Care | Payer: 59 | Source: Ambulatory Visit | Attending: Obstetrics and Gynecology | Admitting: Obstetrics and Gynecology

## 2017-02-13 DIAGNOSIS — N858 Other specified noninflammatory disorders of uterus: Secondary | ICD-10-CM | POA: Diagnosis not present

## 2017-02-13 DIAGNOSIS — Z841 Family history of disorders of kidney and ureter: Secondary | ICD-10-CM | POA: Insufficient documentation

## 2017-02-13 DIAGNOSIS — M797 Fibromyalgia: Secondary | ICD-10-CM | POA: Insufficient documentation

## 2017-02-13 DIAGNOSIS — G4733 Obstructive sleep apnea (adult) (pediatric): Secondary | ICD-10-CM | POA: Insufficient documentation

## 2017-02-13 DIAGNOSIS — J45909 Unspecified asthma, uncomplicated: Secondary | ICD-10-CM | POA: Insufficient documentation

## 2017-02-13 DIAGNOSIS — Z9889 Other specified postprocedural states: Secondary | ICD-10-CM | POA: Insufficient documentation

## 2017-02-13 DIAGNOSIS — Z818 Family history of other mental and behavioral disorders: Secondary | ICD-10-CM | POA: Insufficient documentation

## 2017-02-13 DIAGNOSIS — Z6841 Body Mass Index (BMI) 40.0 and over, adult: Secondary | ICD-10-CM | POA: Insufficient documentation

## 2017-02-13 DIAGNOSIS — M5416 Radiculopathy, lumbar region: Secondary | ICD-10-CM | POA: Insufficient documentation

## 2017-02-13 DIAGNOSIS — N920 Excessive and frequent menstruation with regular cycle: Secondary | ICD-10-CM | POA: Diagnosis not present

## 2017-02-13 DIAGNOSIS — K760 Fatty (change of) liver, not elsewhere classified: Secondary | ICD-10-CM | POA: Insufficient documentation

## 2017-02-13 DIAGNOSIS — G43909 Migraine, unspecified, not intractable, without status migrainosus: Secondary | ICD-10-CM | POA: Insufficient documentation

## 2017-02-13 DIAGNOSIS — Z79891 Long term (current) use of opiate analgesic: Secondary | ICD-10-CM | POA: Insufficient documentation

## 2017-02-13 DIAGNOSIS — Z888 Allergy status to other drugs, medicaments and biological substances status: Secondary | ICD-10-CM | POA: Insufficient documentation

## 2017-02-13 DIAGNOSIS — K219 Gastro-esophageal reflux disease without esophagitis: Secondary | ICD-10-CM | POA: Insufficient documentation

## 2017-02-13 DIAGNOSIS — Z79899 Other long term (current) drug therapy: Secondary | ICD-10-CM | POA: Insufficient documentation

## 2017-02-13 DIAGNOSIS — F329 Major depressive disorder, single episode, unspecified: Secondary | ICD-10-CM | POA: Insufficient documentation

## 2017-02-13 DIAGNOSIS — M4800 Spinal stenosis, site unspecified: Secondary | ICD-10-CM | POA: Insufficient documentation

## 2017-02-13 DIAGNOSIS — F419 Anxiety disorder, unspecified: Secondary | ICD-10-CM | POA: Insufficient documentation

## 2017-02-13 DIAGNOSIS — I10 Essential (primary) hypertension: Secondary | ICD-10-CM | POA: Insufficient documentation

## 2017-02-13 DIAGNOSIS — G629 Polyneuropathy, unspecified: Secondary | ICD-10-CM | POA: Insufficient documentation

## 2017-02-13 DIAGNOSIS — Z7951 Long term (current) use of inhaled steroids: Secondary | ICD-10-CM | POA: Insufficient documentation

## 2017-02-13 DIAGNOSIS — Z885 Allergy status to narcotic agent status: Secondary | ICD-10-CM | POA: Insufficient documentation

## 2017-02-13 DIAGNOSIS — D649 Anemia, unspecified: Secondary | ICD-10-CM | POA: Insufficient documentation

## 2017-02-13 DIAGNOSIS — Z8541 Personal history of malignant neoplasm of cervix uteri: Secondary | ICD-10-CM | POA: Insufficient documentation

## 2017-02-13 DIAGNOSIS — Z91040 Latex allergy status: Secondary | ICD-10-CM | POA: Insufficient documentation

## 2017-02-13 DIAGNOSIS — Z832 Family history of diseases of the blood and blood-forming organs and certain disorders involving the immune mechanism: Secondary | ICD-10-CM | POA: Insufficient documentation

## 2017-02-13 HISTORY — PX: DILITATION & CURRETTAGE/HYSTROSCOPY WITH NOVASURE ABLATION: SHX5568

## 2017-02-13 LAB — PREGNANCY, URINE: PREG TEST UR: NEGATIVE

## 2017-02-13 SURGERY — DILATATION & CURETTAGE/HYSTEROSCOPY WITH NOVASURE ABLATION
Anesthesia: General | Site: Vagina

## 2017-02-13 MED ORDER — PROMETHAZINE HCL 25 MG/ML IJ SOLN: 6.2500 mg | INTRAMUSCULAR | Status: DC | PRN

## 2017-02-13 MED ORDER — HYDROMORPHONE HCL 1 MG/ML IJ SOLN
INTRAMUSCULAR | Status: AC
  Filled 2017-02-13: qty 1

## 2017-02-13 MED ORDER — BUPIVACAINE-EPINEPHRINE (PF) 0.5% -1:200000 IJ SOLN
INTRAMUSCULAR | Status: AC
  Filled 2017-02-13: qty 30

## 2017-02-13 MED ORDER — LACTATED RINGERS IV SOLN
INTRAVENOUS | Status: DC
  Administered 2017-02-13 (×2): via INTRAVENOUS

## 2017-02-13 MED ORDER — DEXAMETHASONE SODIUM PHOSPHATE 10 MG/ML IJ SOLN
INTRAMUSCULAR | Status: AC
  Filled 2017-02-13: qty 1

## 2017-02-13 MED ORDER — KETOROLAC TROMETHAMINE 30 MG/ML IJ SOLN
INTRAMUSCULAR | Status: DC | PRN
  Administered 2017-02-13: 30 mg via INTRAVENOUS

## 2017-02-13 MED ORDER — ONDANSETRON HCL 4 MG/2ML IJ SOLN
INTRAMUSCULAR | Status: DC | PRN
  Administered 2017-02-13: 4 mg via INTRAVENOUS

## 2017-02-13 MED ORDER — KETOROLAC TROMETHAMINE 30 MG/ML IJ SOLN
INTRAMUSCULAR | Status: AC
  Filled 2017-02-13: qty 1

## 2017-02-13 MED ORDER — SCOPOLAMINE 1 MG/3DAYS TD PT72
1.0000 | MEDICATED_PATCH | Freq: Once | TRANSDERMAL | Status: DC
  Administered 2017-02-13: 1.5 mg via TRANSDERMAL

## 2017-02-13 MED ORDER — ONDANSETRON HCL 4 MG PO TABS: 4.0000 mg | ORAL_TABLET | Freq: Three times a day (TID) | ORAL | 0 refills | Status: DC | PRN

## 2017-02-13 MED ORDER — PROPOFOL 10 MG/ML IV BOLUS
INTRAVENOUS | Status: DC | PRN
  Administered 2017-02-13: 180 mg via INTRAVENOUS

## 2017-02-13 MED ORDER — DEXAMETHASONE SODIUM PHOSPHATE 10 MG/ML IJ SOLN
INTRAMUSCULAR | Status: DC | PRN
  Administered 2017-02-13: 10 mg via INTRAVENOUS

## 2017-02-13 MED ORDER — MIDAZOLAM HCL 2 MG/2ML IJ SOLN: 0.5000 mg | Freq: Once | INTRAMUSCULAR | Status: DC | PRN

## 2017-02-13 MED ORDER — FENTANYL CITRATE (PF) 100 MCG/2ML IJ SOLN
INTRAMUSCULAR | Status: DC | PRN
Start: 2017-02-13 — End: 2017-02-13
  Administered 2017-02-13: 50 ug via INTRAVENOUS

## 2017-02-13 MED ORDER — KETOROLAC TROMETHAMINE 30 MG/ML IJ SOLN
INTRAMUSCULAR | Status: AC
  Filled 2017-02-13: qty 2

## 2017-02-13 MED ORDER — LIDOCAINE HCL (CARDIAC) 20 MG/ML IV SOLN
INTRAVENOUS | Status: AC
  Filled 2017-02-13: qty 10

## 2017-02-13 MED ORDER — DEXAMETHASONE SODIUM PHOSPHATE 4 MG/ML IJ SOLN
INTRAMUSCULAR | Status: AC
  Filled 2017-02-13: qty 1

## 2017-02-13 MED ORDER — SODIUM CHLORIDE 0.9 % IR SOLN
Status: DC | PRN
  Administered 2017-02-13: 3000 mL

## 2017-02-13 MED ORDER — PHENYLEPHRINE HCL 10 MG/ML IJ SOLN
INTRAMUSCULAR | Status: DC | PRN
  Administered 2017-02-13 (×3): 80 ug via INTRAVENOUS

## 2017-02-13 MED ORDER — FENTANYL CITRATE (PF) 100 MCG/2ML IJ SOLN
25.0000 ug | INTRAMUSCULAR | Status: DC | PRN
  Administered 2017-02-13: 25 ug via INTRAVENOUS

## 2017-02-13 MED ORDER — TRAMADOL HCL 50 MG PO TABS
50.0000 mg | ORAL_TABLET | Freq: Once | ORAL | Status: AC
  Administered 2017-02-13: 50 mg via ORAL

## 2017-02-13 MED ORDER — FENTANYL CITRATE (PF) 100 MCG/2ML IJ SOLN
INTRAMUSCULAR | Status: AC
  Filled 2017-02-13: qty 2

## 2017-02-13 MED ORDER — TRAMADOL HCL 50 MG PO TABS: 50.0000 mg | ORAL_TABLET | Freq: Four times a day (QID) | ORAL | 0 refills | Status: DC | PRN

## 2017-02-13 MED ORDER — LIDOCAINE HCL (CARDIAC) 20 MG/ML IV SOLN
INTRAVENOUS | Status: DC | PRN
  Administered 2017-02-13: 80 mg via INTRAVENOUS

## 2017-02-13 MED ORDER — BUPIVACAINE-EPINEPHRINE 0.5% -1:200000 IJ SOLN
INTRAMUSCULAR | Status: DC | PRN
  Administered 2017-02-13: 10 mL

## 2017-02-13 MED ORDER — MEPERIDINE HCL 25 MG/ML IJ SOLN: 6.2500 mg | INTRAMUSCULAR | Status: DC | PRN

## 2017-02-13 MED ORDER — ONDANSETRON HCL 4 MG/2ML IJ SOLN
INTRAMUSCULAR | Status: AC
  Filled 2017-02-13: qty 2

## 2017-02-13 MED ORDER — LIDOCAINE HCL (CARDIAC) 20 MG/ML IV SOLN
INTRAVENOUS | Status: AC
  Filled 2017-02-13: qty 5

## 2017-02-13 MED ORDER — PHENYLEPHRINE 40 MCG/ML (10ML) SYRINGE FOR IV PUSH (FOR BLOOD PRESSURE SUPPORT)
PREFILLED_SYRINGE | INTRAVENOUS | Status: AC
  Filled 2017-02-13: qty 10

## 2017-02-13 MED ORDER — GLYCOPYRROLATE 0.2 MG/ML IJ SOLN
INTRAMUSCULAR | Status: AC
  Filled 2017-02-13: qty 1

## 2017-02-13 MED ORDER — TRAMADOL HCL 50 MG PO TABS
ORAL_TABLET | ORAL | Status: AC
  Filled 2017-02-13: qty 1

## 2017-02-13 MED ORDER — MIDAZOLAM HCL 2 MG/2ML IJ SOLN
INTRAMUSCULAR | Status: DC | PRN
  Administered 2017-02-13: 1 mg via INTRAVENOUS

## 2017-02-13 MED ORDER — SCOPOLAMINE 1 MG/3DAYS TD PT72
MEDICATED_PATCH | TRANSDERMAL | Status: AC
  Administered 2017-02-13: 1.5 mg via TRANSDERMAL
  Filled 2017-02-13: qty 1

## 2017-02-13 MED ORDER — PROPOFOL 10 MG/ML IV BOLUS
INTRAVENOUS | Status: AC
  Filled 2017-02-13: qty 20

## 2017-02-13 MED ORDER — MIDAZOLAM HCL 2 MG/2ML IJ SOLN
INTRAMUSCULAR | Status: AC
  Filled 2017-02-13: qty 2

## 2017-02-13 MED ORDER — FENTANYL CITRATE (PF) 250 MCG/5ML IJ SOLN
INTRAMUSCULAR | Status: AC
  Filled 2017-02-13: qty 5

## 2017-02-13 SURGICAL SUPPLY — 22 items
BIPOLAR CUTTING LOOP 21FR (ELECTRODE)
CANISTER SUCT 3000ML PPV (MISCELLANEOUS) ×3 IMPLANT
CATH ROBINSON RED A/P 16FR (CATHETERS) ×3 IMPLANT
CATH SILICONE 16FRX5CC (CATHETERS) ×3 IMPLANT
CLOTH BEACON ORANGE TIMEOUT ST (SAFETY) ×3 IMPLANT
CONTAINER PREFILL 10% NBF 60ML (FORM) ×6 IMPLANT
ELECT COAG BIPOL BALL 21FR (ELECTRODE) IMPLANT
ELECT REM PT RETURN 9FT ADLT (ELECTROSURGICAL)
ELECTRODE REM PT RTRN 9FT ADLT (ELECTROSURGICAL) IMPLANT
GLOVE BIOGEL PI IND STRL 7.0 (GLOVE) ×1 IMPLANT
GLOVE BIOGEL PI IND STRL 8.5 (GLOVE) IMPLANT
GLOVE BIOGEL PI INDICATOR 7.0 (GLOVE) ×2
GLOVE BIOGEL PI INDICATOR 8.5 (GLOVE)
GLOVE ECLIPSE 8.0 STRL XLNG CF (GLOVE) ×6 IMPLANT
GLOVE SKINSENSE N 8.5 STRL (GLOVE) ×3 IMPLANT
GOWN STRL REUS W/TWL LRG LVL3 (GOWN DISPOSABLE) ×6 IMPLANT
LOOP CUTTING BIPOLAR 21FR (ELECTRODE) IMPLANT
PACK VAGINAL MINOR WOMEN LF (CUSTOM PROCEDURE TRAY) ×3 IMPLANT
PAD OB MATERNITY 4.3X12.25 (PERSONAL CARE ITEMS) ×3 IMPLANT
TOWEL OR 17X24 6PK STRL BLUE (TOWEL DISPOSABLE) ×6 IMPLANT
TUBING AQUILEX INFLOW (TUBING) ×3 IMPLANT
TUBING AQUILEX OUTFLOW (TUBING) ×3 IMPLANT

## 2017-02-13 NOTE — Anesthesia Procedure Notes (Signed)
Procedure Name: LMA Insertion Date/Time: 02/13/2017 9:24 AM Performed by: Casimer Lanius A Pre-anesthesia Checklist: Patient being monitored, Patient identified, Emergency Drugs available and Suction available Patient Re-evaluated:Patient Re-evaluated prior to induction Oxygen Delivery Method: Circle system utilized Preoxygenation: Pre-oxygenation with 100% oxygen Induction Type: IV induction and Inhalational induction Ventilation: Mask ventilation without difficulty LMA: LMA inserted and LMA with gastric port inserted LMA Size: 4.0 and 3.0 Number of attempts: 1 Dental Injury: Teeth and Oropharynx as per pre-operative assessment

## 2017-02-13 NOTE — Anesthesia Postprocedure Evaluation (Signed)
Anesthesia Post Note  Patient: Cheyenne Arias  Procedure(s) Performed: DILATATION & CURETTAGE/HYSTEROSCOPY WITH NOVASURE ABLATION (N/A Vagina )     Patient location during evaluation: PACU Anesthesia Type: General Level of consciousness: awake and alert Pain management: pain level controlled Vital Signs Assessment: post-procedure vital signs reviewed and stable Respiratory status: spontaneous breathing, nonlabored ventilation and respiratory function stable Cardiovascular status: blood pressure returned to baseline and stable Postop Assessment: no apparent nausea or vomiting Anesthetic complications: no    Last Vitals:  Vitals:   02/13/17 1130 02/13/17 1152  BP: (!) 138/92   Pulse: 83 81  Resp: 16 17  Temp:  36.6 C  SpO2: 100% 100%    Last Pain:  Vitals:   02/13/17 1152  TempSrc:   PainSc: 8    Pain Goal: Patients Stated Pain Goal: 5 (02/13/17 0803)               Bobie Kistler A.

## 2017-02-13 NOTE — Transfer of Care (Signed)
Immediate Anesthesia Transfer of Care Note  Patient: Cheyenne Arias  Procedure(s) Performed: DILATATION & CURETTAGE/HYSTEROSCOPY WITH NOVASURE ABLATION (N/A Vagina )  Patient Location: PACU  Anesthesia Type:General  Level of Consciousness: sedated  Airway & Oxygen Therapy: Patient Spontanous Breathing and Patient connected to nasal cannula oxygen  Post-op Assessment: Report given to RN and Post -op Vital signs reviewed and stable  Post vital signs: Reviewed and stable  Last Vitals:  Vitals:   02/13/17 0803  BP: 127/69  Pulse: 88  Resp: 16  Temp: 36.8 C  SpO2: 100%    Last Pain:  Vitals:   02/13/17 0803  TempSrc: Oral  PainSc: 7       Patients Stated Pain Goal: 5 (63/01/60 1093)  Complications: No apparent anesthesia complications

## 2017-02-13 NOTE — Telephone Encounter (Signed)
Patient needs to be rescheduled to 03/05/17 11:45 for her hospital colonoscopy.    Left message for patient to call back

## 2017-02-13 NOTE — Op Note (Signed)
OPERATIVE NOTE  Cheyenne Arias  DOB:    July 14, 1975  MRN:    [id number]  CSN:    [id number]  Date of Surgery:  02/13/2017  Preoperative Diagnosis:  Menorrhagia  Hemoglobin equals 8.5  BMI greater than 50  Postoperative Diagnosis:  Same  Procedure:  Hysteroscopy Dilatation and curettage NovaSure ablation of the endometrium  Surgeon:  Gildardo Cranker, M.D.  Assistant:  None  Anesthetic:  General  Disposition:  The patient is a 41 y.o.-year-old female who presents with the above-mentioned diagnosis. She understands the indications for her surgical procedure. She accepts the risk of, but not limited to, anesthetic complications, bleeding, infections, and possible damage to the surrounding organs. She understands that this is not considered contraception. She understands that she should not plan to get pregnant after this procedure.  Findings:  On examination under anesthesia the uterus was upper limits normal size. It is difficult to examine the patient because of her BMI greater than 50. No adnexal masses were appreciated. No parametrial disease was appreciated. The uterus sounded to 11 cm. The patient was noted to have a normal endometrial cavity.  Procedure:  The patient was taken to the operating room where a general anesthetic was given. The perineum and vagina were prepped with Betadine. The bladder was drained of urine. The patient was sterilely draped. Examination under anesthesia was performed. A paracervical block was placed using 10 cc of half percent Marcaine with epinephrine. An endocervical curettage was performed. The cervix was gently dilated. The diagnostic hysteroscope was inserted and the cavity was carefully inspected. Pictures were taken. Findings included: A normal endometrial cavity. The diagnostic hysteroscope was removed. The cervix was dilated further. The cavity was then curetted using a sharp curet. The cavity was felt to be clean at  the end of our procedure. The NovaSure instrument was tested and was noted to function properly. The cervical length was 3.5 cm. The cavity length was 7.5 cm. The cavity width was 3.0 cm. A test procedure was performed to document the integrity of the cavity. The instrument passed. The cavity was then ablated for 1 minute and 44 seconds at a power of 107 W. The patient tolerated her procedure well. Hemostasis was adequate. All instruments were removed. The examination was repeated and the uterus was noted to be firm. Sponge, and needle counts were correct. The estimated blood loss for the procedure was 5 cc. The estimated fluid deficit loss 110 cc. The patient was awakened from her anesthetic without difficulty. She was returned to the supine position and and transported to the recovery room in stable condition. The endocervical curettings, and endometrial resections were sent to pathology.  Followup instructions:  The patient will return to see Dr. Raphael Gibney in 2 weeks. She was given a copy of the postoperative instructions for patients who've undergone endometrial ablation.  Discharge medications:  Motrin 800 mg every 8 hours as needed for mild to moderate pain. Tramadol 50 mg 1 tablet every 4 hours as needed for severe pain. Zofran 4 mg every 4 hours as needed for nausea.  Gildardo Cranker, M.D.  02/13/2017 10:18 AM

## 2017-02-13 NOTE — Anesthesia Preprocedure Evaluation (Addendum)
Anesthesia Evaluation  Patient identified by MRN, date of birth, ID band Patient awake    Reviewed: Allergy & Precautions, NPO status , Patient's Chart, lab work & pertinent test results, reviewed documented beta blocker date and time   History of Anesthesia Complications Negative for: history of anesthetic complications  Airway Mallampati: II  TM Distance: >3 FB Neck ROM: Full    Dental  (+) Dental Advisory Given   Pulmonary asthma , sleep apnea, Continuous Positive Airway Pressure Ventilation and Oxygen sleep apnea ,    breath sounds clear to auscultation       Cardiovascular hypertension, Pt. on medications and Pt. on home beta blockers (-) angina Rhythm:Regular Rate:Normal  '13 ECHO: EF 65-70%, valves OK   Neuro/Psych  Headaches, Anxiety Depression Bipolar Disorder    GI/Hepatic Neg liver ROS, GERD  Medicated and Controlled,Non-alcoholic fatty liver   Endo/Other  Morbid obesity  Renal/GU negative Renal ROS     Musculoskeletal  (+) Arthritis , Fibromyalgia -  Abdominal (+) + obese,   Peds  Hematology negative hematology ROS (+)   Anesthesia Other Findings   Reproductive/Obstetrics                            Anesthesia Physical Anesthesia Plan  ASA: III  Anesthesia Plan: General   Post-op Pain Management:    Induction: Intravenous  PONV Risk Score and Plan: 3 and Ondansetron, Dexamethasone and Midazolam  Airway Management Planned: LMA  Additional Equipment:   Intra-op Plan:   Post-operative Plan:   Informed Consent: I have reviewed the patients History and Physical, chart, labs and discussed the procedure including the risks, benefits and alternatives for the proposed anesthesia with the patient or authorized representative who has indicated his/her understanding and acceptance.   Dental advisory given  Plan Discussed with: CRNA and Surgeon  Anesthesia Plan Comments:  (Plan routine monitors, GA- LMA OK)        Anesthesia Quick Evaluation

## 2017-02-13 NOTE — Progress Notes (Signed)
The patient was interviewed and examined today.  The previously documented history and physical examination was reviewed. There are no changes. The operative procedure was reviewed. The risks and benefits were outlined again. The specific risks include, but are not limited to, anesthetic complications, bleeding, infections, and possible damage to the surrounding organs. The patient's questions were answered.  We are ready to proceed as outlined. The likelihood of the patient achieving the goals of this procedure is very likely.   BP 127/69   Pulse 88   Temp 98.2 F (36.8 C) (Oral)   Resp 16   LMP 02/05/2017 (Approximate)   SpO2 100%   Results for orders placed or performed during the hospital encounter of 02/13/17 (from the past 24 hour(s))  Pregnancy, urine     Status: None   Collection Time: 02/13/17  8:00 AM  Result Value Ref Range   Preg Test, Ur NEGATIVE NEGATIVE   CBC    Component Value Date/Time   WBC 9.0 02/12/2017 0920   RBC 4.48 02/12/2017 0920   HGB 10.1 (L) 02/12/2017 0920   HGB 8.5 (L) 12/11/2016 1213   HCT 32.7 (L) 02/12/2017 0920   HCT 30.0 (L) 12/11/2016 1213   PLT 349 02/12/2017 0920   PLT 392 12/11/2016 1213   MCV 73.0 (L) 02/12/2017 0920   MCV 62.2 (L) 12/11/2016 1213   MCH 22.5 (L) 02/12/2017 0920   MCHC 30.9 02/12/2017 0920   RDW 25.6 (H) 02/12/2017 0920   RDW 18.3 (H) 12/11/2016 1213   LYMPHSABS 2.5 12/11/2016 1213   MONOABS 0.5 12/11/2016 1213   EOSABS 0.2 12/11/2016 1213   BASOSABS 0.0 12/11/2016 1213     Gildardo Cranker, M.D.

## 2017-02-13 NOTE — Discharge Instructions (Signed)
Endometrial Ablation Endometrial ablation is a procedure that destroys the thin inner layer of the lining of the uterus (endometrium). This procedure may be done:  To stop heavy periods.  To stop bleeding that is causing anemia.  To control irregular bleeding.  To treat bleeding caused by small tumors (fibroids) in the endometrium.  This procedure is often an alternative to major surgery, such as removal of the uterus and cervix (hysterectomy). As a result of this procedure:  You may not be able to have children. However, if you are premenopausal (you have not gone through menopause): ? You may still have a small chance of getting pregnant. ? You will need to use a reliable method of birth control after the procedure to prevent pregnancy.  You may stop having a menstrual period, or you may have only a small amount of bleeding during your period. Menstruation may return several years after the procedure.  Tell a health care provider about:  Any allergies you have.  All medicines you are taking, including vitamins, herbs, eye drops, creams, and over-the-counter medicines.  Any problems you or family members have had with the use of anesthetic medicines.  Any blood disorders you have.  Any surgeries you have had.  Any medical conditions you have. What are the risks? Generally, this is a safe procedure. However, problems may occur, including:  A hole (perforation) in the uterus or bowel.  Infection of the uterus, bladder, or vagina.  Bleeding.  Damage to other structures or organs.  An air bubble in the lung (air embolus).  Problems with pregnancy after the procedure.  Failure of the procedure.  Decreased ability to diagnose cancer in the endometrium.  What happens before the procedure?  You will have tests of your endometrium to make sure there are no pre-cancerous cells or cancer cells present.  You may have an ultrasound of the uterus.  You may be given  medicines to thin the endometrium.  Ask your health care provider about: ? Changing or stopping your regular medicines. This is especially important if you take diabetes medicines or blood thinners. ? Taking medicines such as aspirin and ibuprofen. These medicines can thin your blood. Do not take these medicines before your procedure if your doctor tells you not to.  Plan to have someone take you home from the hospital or clinic. What happens during the procedure?  You will lie on an exam table with your feet and legs supported as in a pelvic exam.  To lower your risk of infection: ? Your health care team will wash or sanitize their hands and put on germ-free (sterile) gloves. ? Your genital area will be washed with soap.  An IV tube will be inserted into one of your veins.  You will be given a medicine to help you relax (sedative).  A surgical instrument with a light and camera (resectoscope) will be inserted into your vagina and moved into your uterus. This allows your surgeon to see inside your uterus.  Endometrial tissue will be removed using one of the following methods: ? Radiofrequency. This method uses a radiofrequency-alternating electric current to remove the endometrium. ? Cryotherapy. This method uses extreme cold to freeze the endometrium. ? Heated-free liquid. This method uses a heated saltwater (saline) solution to remove the endometrium. ? Microwave. This method uses high-energy microwaves to heat up the endometrium and remove it. ? Thermal balloon. This method involves inserting a catheter with a balloon tip into the uterus. The balloon tip is  filled with heated fluid to remove the endometrium. The procedure may vary among health care providers and hospitals. What happens after the procedure?  Your blood pressure, heart rate, breathing rate, and blood oxygen level will be monitored until the medicines you were given have worn off.  As tissue healing occurs, you may  notice vaginal bleeding for 4-6 weeks after the procedure. You may also experience: ? Cramps. ? Thin, watery vaginal discharge that is light pink or brown in color. ? A need to urinate more frequently than usual. ? Nausea.  Do not drive for 24 hours if you were given a sedative.  Do not have sex or insert anything into your vagina until your health care provider approves. Summary  Endometrial ablation is done to treat the many causes of heavy menstrual bleeding.  The procedure may be done only after medications have been tried to control the bleeding.  Plan to have someone take you home from the hospital or clinic. This information is not intended to replace advice given to you by your health care provider. Make sure you discuss any questions you have with your health care provider. Document Released: 02/10/2004 Document Revised: 04/19/2016 Document Reviewed: 04/19/2016 Elsevier Interactive Patient Education  2017 [place] INSTRUCTIONS: HYSTEROSCOPY / ENDOMETRIAL ABLATION The following instructions have been prepared to help you care for yourself upon your return home.  May Remove Scop patch on or before 02/15/17  May take Ibuprofen after 4:00pm today.  May take stool softner while taking narcotic pain medication to prevent constipation.  Drink plenty of water.  Personal hygiene:  Use sanitary pads for vaginal drainage, not tampons.  Shower the day after your procedure.  NO tub baths, pools or Jacuzzis for 2-3 weeks.  Wipe front to back after using the bathroom.  Activity and limitations:  Do NOT drive or operate any equipment for 24 hours. The effects of anesthesia are still present and drowsiness may result.  Do NOT rest in bed all day.  Walking is encouraged.  Walk up and down stairs slowly.  You may resume your normal activity in one to two days or as indicated by your physician. Sexual activity: NO intercourse for at least 2 weeks after the  procedure, or as indicated by your Doctor.  Diet: Eat a light meal as desired this evening. You may resume your usual diet tomorrow.  Return to Work: You may resume your work activities in one to two days or as indicated by Marine scientist.  What to expect after your surgery: Expect to have vaginal bleeding/discharge for 2-3 days and spotting for up to 10 days. It is not unusual to have soreness for up to 1-2 weeks. You may have a slight burning sensation when you urinate for the first day. Mild cramps may continue for a couple of days. You may have a regular period in 2-6 weeks.  Call your doctor for any of the following:  Excessive vaginal bleeding or clotting, saturating and changing one pad every hour.  Inability to urinate 6 hours after discharge from hospital.  Pain not relieved by pain medication.  Fever of 100.4 F or greater.  Unusual vaginal discharge or odor.  Return to office _________________Call for an appointment ___________________ Patients signature: ______________________ Nurses signature ________________________  Post Anesthesia Care Unit [phone]    Post Anesthesia Home Care Instructions  Activity: Get plenty of rest for the remainder of the day. A responsible individual must stay with you for 24 hours following  the procedure.  For the next 24 hours, DO NOT: -Drive a car -Paediatric nurse -Drink alcoholic beverages -Take any medication unless instructed by your physician -Make any legal decisions or sign important papers.  Meals: Start with liquid foods such as gelatin or soup. Progress to regular foods as tolerated. Avoid greasy, spicy, heavy foods. If nausea and/or vomiting occur, drink only clear liquids until the nausea and/or vomiting subsides. Call your physician if vomiting continues.  Special Instructions/Symptoms: Your throat may feel dry or sore from the anesthesia or the breathing tube placed in your throat during surgery. If this  causes discomfort, gargle with warm salt water. The discomfort should disappear within 24 hours.  If you had a scopolamine patch placed behind your ear for the management of post- operative nausea and/or vomiting:  1. The medication in the patch is effective for 72 hours, after which it should be removed.  Wrap patch in a tissue and discard in the trash. Wash hands thoroughly with soap and water. 2. You may remove the patch earlier than 72 hours if you experience unpleasant side effects which may include dry mouth, dizziness or visual disturbances. 3. Avoid touching the patch. Wash your hands with soap and water after contact with the patch.

## 2017-02-14 ENCOUNTER — Encounter (HOSPITAL_COMMUNITY): Payer: Self-pay | Admitting: Obstetrics and Gynecology

## 2017-02-14 NOTE — Telephone Encounter (Signed)
Patient called back and is rescheduled to 04/01/17 8:30.  We reviewed instructions and she verbalized understanding of new dates and times.

## 2017-02-14 NOTE — Telephone Encounter (Signed)
The appt that was scheduled by Delilah Shan was put on the incorrect day.  Patient is scheduled for 03/05/17 11:45 and has the proper instructions.

## 2017-02-15 ENCOUNTER — Ambulatory Visit (HOSPITAL_COMMUNITY): Payer: Self-pay | Admitting: Psychiatry

## 2017-02-25 ENCOUNTER — Telehealth: Payer: Self-pay | Admitting: Pulmonary Disease

## 2017-02-25 MED ORDER — AMOXICILLIN-POT CLAVULANATE 875-125 MG PO TABS: 1.0000 | ORAL_TABLET | Freq: Two times a day (BID) | ORAL | 0 refills | Status: DC

## 2017-02-25 NOTE — Telephone Encounter (Signed)
Pt c/o sinus congestion, pnd, prod cough with green mucus X1 week.   Pt states she has a low grade temp but is unable to tell me what her temp is.  Also c/o fatigue, body aches.   Pt notes she had sx on 10/31-uterine ablation.    Pt is taking theraflu, requesting further recs.   Pt uses [facility].    VS please advise.  Thanks!

## 2017-02-25 NOTE — Telephone Encounter (Signed)
Can send order for augmentin 875 mg bid for 7 days with no refills.

## 2017-02-27 ENCOUNTER — Ambulatory Visit (INDEPENDENT_AMBULATORY_CARE_PROVIDER_SITE_OTHER): Payer: 59 | Admitting: Psychiatry

## 2017-02-27 ENCOUNTER — Encounter (HOSPITAL_COMMUNITY): Payer: Self-pay | Admitting: Psychiatry

## 2017-02-27 ENCOUNTER — Other Ambulatory Visit: Payer: Self-pay

## 2017-02-27 DIAGNOSIS — R110 Nausea: Secondary | ICD-10-CM | POA: Diagnosis not present

## 2017-02-27 MED ORDER — ARIPIPRAZOLE 2 MG PO TABS: 2.0000 mg | ORAL_TABLET | Freq: Every day | ORAL | 2 refills | Status: DC

## 2017-02-27 MED ORDER — FLUOXETINE HCL 20 MG PO CAPS: 20.0000 mg | ORAL_CAPSULE | Freq: Every day | ORAL | 2 refills | Status: DC

## 2017-02-27 MED ORDER — CLONAZEPAM 0.5 MG PO TABS: 0.5000 mg | ORAL_TABLET | Freq: Two times a day (BID) | ORAL | 1 refills | Status: DC | PRN

## 2017-02-27 NOTE — Progress Notes (Signed)
Patient ID: Cheyenne Arias, female   DOB: 28-May-1975, 41 y.o.   MRN: [id number]  [place] Follow up outpatient visit   Cheyenne Arias [id number] 41 y.o.  02/27/2017 10:53 AM  Chief Complaint:  Anxiety, depression. Follow up History of Present Illness:   Patient Presents for follow-up and medication management for major depressive disorder. Panic disorder. PTSD.  Patient is a single African-American female diagnosed with multiple medical conditions. She has 3 kids 2 of which are living with her 63 year old daughter and a 76-year-old son.    As per initial evaluation " Patient has been diagnosed with depression and says possible bipolar . Says that she is on Abilify 2 mg for night terrors and depression."  Worries about her son in prison. Increase anxiety says klonopine needs bid as otherwise she feels it effects her breathing due to anxiety   Medical complexity/Data: has OSA uses cpap. Which helps her sleep. Has fibromyalgia which complicates her depression. She continues with topamax and lyrica.  Severity of depression :baseline.  Aggravating factors: [name] Medical conditions Son in prison. fibromyalgia pain. One son has autism She also has a possibility of pituitary tumor that is being assessed  Modifying factors; home health Prior Suicide Attempts: No  Medical History; Past Medical History:  Diagnosis Date  . Allergy    seasonal  . Anemia    goes to [facility], ferahem  . Anxiety   . Arthritis    spinal stenosis  . Asthma   . Bipolar disorder ([place])   . Cancer [facility]) 2013   Cervical cancer  . Depression   . Fatty liver disease, nonalcoholic   . Fibromyalgia   . Food poisoning   . GERD (gastroesophageal reflux disease)   . 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 HSV infection   . Hypertension   . Lumbar radiculopathy   . Migraines   . Neuropathy   . Obesity   . OSA (obstructive sleep apnea)   . OSA on CPAP    uses it nightly  . Peripheral  neuropathy    LLE - pins and needles  . Shortness of breath dyspnea    with exertion  . Spinal stenosis     Allergies: Allergies  Allergen Reactions  . Cymbalta [Duloxetine Hcl] Other (See Comments)    Head felt cold and tight, loss of conciousness  . Latex Itching, Swelling and Other (See Comments)    Reaction:  Blisters   . Morphine And Related Itching    Medications: Outpatient Encounter Medications as of 02/27/2017  Medication Sig  . albuterol (PROAIR HFA) 108 (90 Base) MCG/ACT inhaler Inhale 2 puffs into the lungs every 6 (six) hours as needed for wheezing or shortness of breath.  [name] amLODipine (NORVASC) 10 MG tablet Take 10 mg by mouth daily.  [name] amoxicillin-clavulanate (AUGMENTIN) 875-125 MG tablet Take 1 tablet 2 (two) times daily by mouth.  . ARIPiprazole (ABILIFY) 2 MG tablet Take 1 tablet (2 mg total) at bedtime by mouth.  . clonazePAM (KLONOPIN) 0.5 MG tablet Take 1 tablet (0.5 mg total) 2 (two) times daily as needed by mouth for anxiety.  . cyclobenzaprine (FLEXERIL) 10 MG tablet Take 10 mg by mouth 3 (three) times daily.   . ferumoxytol (FERAHEME) 510 MG/17ML SOLN injection Inject 510 mg into the vein every 3 (three) months.   [name] FLUoxetine (PROZAC) 20 MG capsule Take 1 capsule (20 mg total) daily by mouth.  . fluticasone (FLONASE) 50 MCG/ACT nasal  spray PLACE 2 SPRAYS INTO BOTH NOSTRILS 2 (TWO) TIMES DAILY.  . furosemide (LASIX) 80 MG tablet Take 80 mg by mouth 2 (two) times daily.  [name] ibuprofen (ADVIL,MOTRIN) 800 MG tablet Take 800 mg by mouth every 8 (eight) hours as needed for headache or moderate pain.   [name] ipratropium-albuterol (DUONEB) 0.5-2.5 (3) MG/3ML SOLN TAKE 3 MLS BY NEBULIZATION EVERY 4 HOURS AS NEEDED (FOR WHEEZING/SHORTNESS OF BREATH). (Patient taking differently: TAKE 3 MLS BY NEBULIZATION EVERY 6 HOURS AS NEEDED (FOR WHEEZING/SHORTNESS OF BREATH).)  . loratadine (CLARITIN) 10 MG tablet TAKE 1 TABLET BY MOUTH EVERY DAY (Patient taking differently: TAKE 10 MG  BY MOUTH at night)  . metoprolol tartrate (LOPRESSOR) 25 MG tablet Take 25 mg by mouth 2 (two) times daily.   . montelukast (SINGULAIR) 10 MG tablet TAKE 1 TABLET BY MOUTH AT BEDTIME (Patient taking differently: TAKE 10 MG BY MOUTH AT BEDTIME)  . Multiple Vitamins-Minerals (MULTIVITAMIN WITH MINERALS) tablet Take 1 tablet by mouth daily.  . ondansetron (ZOFRAN) 4 MG tablet Take 1 tablet (4 mg total) by mouth every 8 (eight) hours as needed for nausea or vomiting.  [name] oxyCODONE (ROXICODONE) 15 MG immediate release tablet Take 15 mg by mouth every 6 (six) hours as needed for pain.  . OXYGEN Inhale 2 L into the lungs at bedtime.  . pantoprazole (PROTONIX) 40 MG tablet TAKE 1 TABLET BY MOUTH EVERY DAY *NEEDS OFFICE VISIT (Patient taking differently: Take 40 mg by mouth at night)  . potassium chloride SA (K-DUR,KLOR-CON) 20 MEQ tablet Take 1 tablet (20 mEq total) by mouth daily. (Patient taking differently: Take 20 mEq by mouth 2 (two) times daily. )  . pregabalin (LYRICA) 300 MG capsule Take 300 mg by mouth 2 (two) times daily.   [name] PULMICORT 0.5 MG/2ML nebulizer solution TAKE 2 MLS BY NEBULIZATION 2 TIMES DAILY. DX CODE: CHRONIC ASTHMA J45.909  . rizatriptan (MAXALT) 10 MG tablet Take 10 mg by mouth as needed for migraine. May repeat in 2 hours if needed  . topiramate (TOPAMAX) 100 MG tablet Take 200 mg by mouth 2 (two) times daily.   . traMADol (ULTRAM) 50 MG tablet Take 1 tablet (50 mg total) by mouth every 6 (six) hours as needed.  . triamcinolone cream (KENALOG) 0.1 % Apply 1 application topically daily as needed (for rash).   . [DISCONTINUED] ARIPiprazole (ABILIFY) 2 MG tablet Take 1 tablet (2 mg total) by mouth daily. (Patient taking differently: Take 2 mg by mouth at bedtime. )  . [DISCONTINUED] clonazePAM (KLONOPIN) 0.5 MG tablet Take 1 tablet (0.5 mg total) by mouth daily as needed for anxiety.  . [DISCONTINUED] FLUoxetine (PROZAC) 20 MG capsule Take 1 capsule (20 mg total) by mouth daily.    No facility-administered encounter medications on file as of 02/27/2017.      Family History; Family History  Problem Relation Age of Onset  . Hypertension Mother   . Sickle cell trait Maternal Aunt   . Sickle cell anemia Other   . Schizophrenia Son   . Depression Daughter   . Kidney disease Father        transplant required  . Stomach cancer Neg Hx   . Colon cancer Neg Hx        Labs:  Recent Results (from the past 2160 hour(s))  Ferritin     Status: None   Collection Time: 12/06/16 11:44 AM  Result Value Ref Range   Ferritin 25 9 - 269 ng/ml  CBC & Diff  and Retic     Status: Abnormal   Collection Time: 12/06/16 11:44 AM  Result Value Ref Range   WBC 10.7 (H) 3.9 - 10.3 10e3/uL   NEUT# 7.8 (H) 1.5 - 6.5 10e3/uL   HGB 8.6 (L) 11.6 - 15.9 g/dL   HCT 30.3 (L) 34.8 - 46.6 %   Platelets 396 145 - 400 10e3/uL   MCV 62.1 (L) 79.5 - 101.0 fL   MCH 17.6 (L) 25.1 - 34.0 pg   MCHC 28.4 (L) 31.5 - 36.0 g/dL   RBC 4.88 3.70 - 5.45 10e6/uL   RDW 18.7 (H) 11.2 - 14.5 %   lymph# 2.2 0.9 - 3.3 10e3/uL   MONO# 0.4 0.1 - 0.9 10e3/uL   Eosinophils Absolute 0.2 0.0 - 0.5 10e3/uL   Basophils Absolute 0.0 0.0 - 0.1 10e3/uL   NEUT% 72.8 38.4 - 76.8 %   LYMPH% 20.9 14.0 - 49.7 %   MONO% 3.8 0.0 - 14.0 %   EOS% 2.2 0.0 - 7.0 %   BASO% 0.3 0.0 - 2.0 %   nRBC 0 0 - 0 %   Retic % 1.54 0.70 - 2.10 %   Retic Ct Abs 75.15 33.70 - 90.70 10e3/uL   Immature Retic Fract 15.20 (H) 1.60 - 10.00 %  Iron and TIBC     Status: Abnormal   Collection Time: 12/06/16 11:44 AM  Result Value Ref Range   Iron 14 (L) 41 - 142 ug/dL   TIBC 424 236 - 444 ug/dL   UIBC 410 (H) 120 - 384 ug/dL   %SAT 3 (L) 21 - 57 %  CBC with Differential     Status: Abnormal   Collection Time: 12/11/16 12:13 PM  Result Value Ref Range   WBC 12.4 (H) 3.9 - 10.3 10e3/uL   NEUT# 9.3 (H) 1.5 - 6.5 10e3/uL   HGB 8.5 (L) 11.6 - 15.9 g/dL   HCT 30.0 (L) 34.8 - 46.6 %   Platelets 392 145 - 400 10e3/uL   MCV 62.2 (L) 79.5  - 101.0 fL   MCH 17.6 (L) 25.1 - 34.0 pg   MCHC 28.3 (L) 31.5 - 36.0 g/dL   RBC 4.82 3.70 - 5.45 10e6/uL   RDW 18.3 (H) 11.2 - 14.5 %   lymph# 2.5 0.9 - 3.3 10e3/uL   MONO# 0.5 0.1 - 0.9 10e3/uL   Eosinophils Absolute 0.2 0.0 - 0.5 10e3/uL   Basophils Absolute 0.0 0.0 - 0.1 10e3/uL   NEUT% 74.3 38.4 - 76.8 %   LYMPH% 20.0 14.0 - 49.7 %   MONO% 3.9 0.0 - 14.0 %   EOS% 1.6 0.0 - 7.0 %   BASO% 0.2 0.0 - 2.0 %  Basic metabolic panel     Status: None   Collection Time: 02/12/17  9:20 AM  Result Value Ref Range   Sodium 138 135 - 145 mmol/L   Potassium 3.6 3.5 - 5.1 mmol/L   Chloride 110 101 - 111 mmol/L   CO2 22 22 - 32 mmol/L   Glucose, Bld 99 65 - 99 mg/dL   BUN 13 6 - 20 mg/dL   Creatinine, Ser 0.73 0.44 - 1.00 mg/dL   Calcium 9.0 8.9 - 10.3 mg/dL   GFR calc non Af Amer >60 >60 mL/min   GFR calc Af Amer >60 >60 mL/min    Comment: (NOTE) The eGFR has been calculated using the CKD EPI equation. This calculation has not been validated in all clinical situations. eGFR's persistently <60 mL/min signify  possible Chronic Kidney Disease.    Anion gap 6 5 - 15  CBC     Status: Abnormal   Collection Time: 02/12/17  9:20 AM  Result Value Ref Range   WBC 9.0 4.0 - 10.5 K/uL   RBC 4.48 3.87 - 5.11 MIL/uL   Hemoglobin 10.1 (L) 12.0 - 15.0 g/dL   HCT 32.7 (L) 36.0 - 46.0 %   MCV 73.0 (L) 78.0 - 100.0 fL   MCH 22.5 (L) 26.0 - 34.0 pg   MCHC 30.9 30.0 - 36.0 g/dL   RDW 25.6 (H) 11.5 - 15.5 %   Platelets 349 150 - 400 K/uL  Pregnancy, urine     Status: None   Collection Time: 02/13/17  8:00 AM  Result Value Ref Range   Preg Test, Ur NEGATIVE NEGATIVE    Comment:        THE SENSITIVITY OF THIS METHODOLOGY IS >20 mIU/mL.         Mental Status Examination;   Psychiatric Specialty Exam: Physical Exam  Constitutional: She appears well-nourished.  Skin: She is not diaphoretic.    Review of Systems  Constitutional: Positive for malaise/fatigue. Negative for fever.   Cardiovascular: Negative for chest pain.  Gastrointestinal: Negative for nausea and vomiting.  Skin: Negative for rash.  Psychiatric/Behavioral: Negative for substance abuse and suicidal ideas. The patient is nervous/anxious.     Blood pressure 130/80, pulse 72, resp. rate 18, height '5\' 2"'$  (1.575 m), weight 284 lb (128.8 kg), last menstrual period 02/25/2017, SpO2 96 %.Body mass index is 51.94 kg/m.  General Appearance: Casual  Eye Contact::  Fair  Speech:  Slow  Volume:  Normal  Mood:  Somewhat subdued  Affect:  Congruent cooperative  Thought Process:  Coherent  Orientation:  Full (Time, Place, and Person)  Thought Content:  Rumination  Suicidal Thoughts:  No  Homicidal Thoughts:  No  Memory:  Immediate;   Fair Recent;   Fair  Judgement:  Fair  Insight:  Fair  Psychomotor Activity:  Normal  Concentration:  Fair  Recall:  Fair  Akathisia:  Negative  Handed:  Right  AIMS (if indicated):     Assets:  Communication Skills Desire for Improvement Financial Resources/Insurance Housing  Sleep:        Assessment: Axis I: Major depressive disorder recurrent moderate. Panic disorder. Mood disorder otherwise nonspecified. Possible related  secondary to general medical condition including chronic iback pain  Axis II: Deferred   Axis III:  Past Medical History:  Diagnosis Date  . Allergy    seasonal  . Anemia    goes to [facility], ferahem  . Anxiety   . Arthritis    spinal stenosis  . Asthma   . Bipolar disorder ([place])   . Cancer [facility]) 2013   Cervical cancer  . Depression   . Fatty liver disease, nonalcoholic   . Fibromyalgia   . Food poisoning   . GERD (gastroesophageal reflux disease)   . 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 HSV infection   . Hypertension   . Lumbar radiculopathy   . Migraines   . Neuropathy   . Obesity   . OSA (obstructive sleep apnea)   . OSA on CPAP    uses it nightly  . Peripheral neuropathy    LLE - pins and needles  . Shortness  of breath dyspnea    with exertion  . Spinal stenosis     Axis IV: Psychosocial, single mom, multiple medical  conditions.   Treatment Plan and Summary:  Mood disorder:somewhat subdued. States anxiety more continue prozac  Continue abilify for night terrors and depression  No side effects Panic disorder; infequent. But fluctuating anxiety and apprehension. Will increae klonopine back to bid  Questions addressed FU 2 months.  Merian Capron, MD 02/27/2017

## 2017-03-01 ENCOUNTER — Telehealth: Payer: Self-pay | Admitting: Pulmonary Disease

## 2017-03-01 DIAGNOSIS — R110 Nausea: Secondary | ICD-10-CM

## 2017-03-01 DIAGNOSIS — N390 Urinary tract infection, site not specified: Secondary | ICD-10-CM

## 2017-03-01 DIAGNOSIS — D259 Leiomyoma of uterus, unspecified: Secondary | ICD-10-CM

## 2017-03-01 DIAGNOSIS — J45909 Unspecified asthma, uncomplicated: Secondary | ICD-10-CM

## 2017-03-01 MED ORDER — IPRATROPIUM-ALBUTEROL 0.5-2.5 (3) MG/3ML IN SOLN: RESPIRATORY_TRACT | 5 refills | Status: DC

## 2017-03-01 MED ORDER — BUDESONIDE 0.5 MG/2ML IN SUSP: RESPIRATORY_TRACT | 5 refills | Status: DC

## 2017-03-01 NOTE — Telephone Encounter (Signed)
Spoke with the pt  She is requesting order sent to [facility] to start getting pulmicort and duoneb  I advised this is fine, order sent to [facility] and rx placed in VS's green "to do" folder in his cubby  Please sign and let triage know and we will give to [facility], thanks!

## 2017-03-02 ENCOUNTER — Other Ambulatory Visit: Payer: Self-pay

## 2017-03-02 ENCOUNTER — Encounter (HOSPITAL_BASED_OUTPATIENT_CLINIC_OR_DEPARTMENT_OTHER): Payer: Self-pay | Admitting: *Deleted

## 2017-03-02 ENCOUNTER — Emergency Department (HOSPITAL_BASED_OUTPATIENT_CLINIC_OR_DEPARTMENT_OTHER)
Admission: EM | Admit: 2017-03-02 | Discharge: 2017-03-02 | Disposition: A | Discharge status: Home / Self Care | Payer: 59 | Attending: Emergency Medicine | Admitting: Emergency Medicine

## 2017-03-02 DIAGNOSIS — Z5321 Procedure and treatment not carried out due to patient leaving prior to being seen by health care provider: Secondary | ICD-10-CM | POA: Diagnosis not present

## 2017-03-02 DIAGNOSIS — R51 Headache: Secondary | ICD-10-CM | POA: Diagnosis present

## 2017-03-02 MED ORDER — ONDANSETRON 4 MG PO TBDP
4.0000 mg | ORAL_TABLET | Freq: Once | ORAL | Status: AC | PRN
  Administered 2017-03-02: 4 mg via ORAL
  Filled 2017-03-02: qty 1

## 2017-03-02 NOTE — ED Triage Notes (Signed)
Pt presents with mha since yesterday. Hx of same. States she is taking amoxicillin for bronchitis. +nausea, no vomiting. Tearful in triage

## 2017-03-02 NOTE — ED Triage Notes (Signed)
Called x 2 no answer, pt not located in either lobby

## 2017-03-02 NOTE — ED Triage Notes (Signed)
Pt called for vital sign recheck. No answer

## 2017-03-02 NOTE — ED Triage Notes (Signed)
Call x 3 no answer

## 2017-03-04 NOTE — Telephone Encounter (Signed)
Judeen Hammans in [facility] asked if I rec'd info on pt's duonev and pulmicort. Nothing was in the green "to do" folder of VS on his desk as of this morning, it is empty. I have not seen anything on this patient today. Routing message to VS to see if he has this information?

## 2017-03-05 ENCOUNTER — Telehealth: Payer: Self-pay | Admitting: Pulmonary Disease

## 2017-03-05 DIAGNOSIS — N390 Urinary tract infection, site not specified: Secondary | ICD-10-CM

## 2017-03-05 DIAGNOSIS — D259 Leiomyoma of uterus, unspecified: Secondary | ICD-10-CM

## 2017-03-05 DIAGNOSIS — R110 Nausea: Secondary | ICD-10-CM

## 2017-03-05 MED ORDER — IPRATROPIUM-ALBUTEROL 0.5-2.5 (3) MG/3ML IN SOLN: RESPIRATORY_TRACT | 5 refills | Status: DC

## 2017-03-05 MED ORDER — BUDESONIDE 0.5 MG/2ML IN SUSP: RESPIRATORY_TRACT | 5 refills | Status: DC

## 2017-03-05 NOTE — Telephone Encounter (Signed)
I don't having anything either.

## 2017-03-05 NOTE — Telephone Encounter (Signed)
Reprinted the two scripts for duoneb and Pulmicort. VS is not here at this time today, SG seen patient months ago, asked SG to sign them after reviewing the pt's chart. The scripts are signed and given back to [facility] today. Sherry with [facility] rec'd scripts and faxing them over to [facility] today.

## 2017-03-06 NOTE — Telephone Encounter (Signed)
See 11/20 phone note.

## 2017-03-10 ENCOUNTER — Other Ambulatory Visit: Payer: Self-pay

## 2017-03-10 ENCOUNTER — Encounter (HOSPITAL_BASED_OUTPATIENT_CLINIC_OR_DEPARTMENT_OTHER): Payer: Self-pay | Admitting: Emergency Medicine

## 2017-03-10 ENCOUNTER — Emergency Department (HOSPITAL_BASED_OUTPATIENT_CLINIC_OR_DEPARTMENT_OTHER)
Admission: EM | Admit: 2017-03-10 | Discharge: 2017-03-10 | Disposition: A | Discharge status: Home / Self Care | Payer: 59 | Attending: Emergency Medicine | Admitting: Emergency Medicine

## 2017-03-10 DIAGNOSIS — J45909 Unspecified asthma, uncomplicated: Secondary | ICD-10-CM | POA: Diagnosis not present

## 2017-03-10 DIAGNOSIS — Z91040 Latex allergy status: Secondary | ICD-10-CM | POA: Diagnosis not present

## 2017-03-10 DIAGNOSIS — Z8541 Personal history of malignant neoplasm of cervix uteri: Secondary | ICD-10-CM | POA: Insufficient documentation

## 2017-03-10 DIAGNOSIS — N760 Acute vaginitis: Secondary | ICD-10-CM | POA: Diagnosis not present

## 2017-03-10 DIAGNOSIS — Z711 Person with feared health complaint in whom no diagnosis is made: Secondary | ICD-10-CM | POA: Diagnosis not present

## 2017-03-10 DIAGNOSIS — B9689 Other specified bacterial agents as the cause of diseases classified elsewhere: Secondary | ICD-10-CM

## 2017-03-10 DIAGNOSIS — I10 Essential (primary) hypertension: Secondary | ICD-10-CM | POA: Diagnosis not present

## 2017-03-10 DIAGNOSIS — Z79899 Other long term (current) drug therapy: Secondary | ICD-10-CM | POA: Diagnosis not present

## 2017-03-10 DIAGNOSIS — N939 Abnormal uterine and vaginal bleeding, unspecified: Secondary | ICD-10-CM | POA: Diagnosis present

## 2017-03-10 LAB — CBC WITH DIFFERENTIAL/PLATELET
BASOS ABS: 0 10*3/uL (ref 0.0–0.1)
BASOS PCT: 0 %
EOS ABS: 0.3 10*3/uL (ref 0.0–0.7)
Eosinophils Relative: 2 %
HCT: 33 % — ABNORMAL LOW (ref 36.0–46.0)
HEMOGLOBIN: 10.1 g/dL — AB (ref 12.0–15.0)
LYMPHS PCT: 18 %
Lymphs Abs: 2.3 10*3/uL (ref 0.7–4.0)
MCH: 22.2 pg — ABNORMAL LOW (ref 26.0–34.0)
MCHC: 30.6 g/dL (ref 30.0–36.0)
MCV: 72.7 fL — ABNORMAL LOW (ref 78.0–100.0)
MONO ABS: 0.9 10*3/uL (ref 0.1–1.0)
MONOS PCT: 7 %
NEUTROS PCT: 73 %
Neutro Abs: 9.3 10*3/uL — ABNORMAL HIGH (ref 1.7–7.7)
PLATELETS: 363 10*3/uL (ref 150–400)
RBC: 4.54 MIL/uL (ref 3.87–5.11)
RDW: 22.4 % — ABNORMAL HIGH (ref 11.5–15.5)
WBC: 12.8 10*3/uL — AB (ref 4.0–10.5)

## 2017-03-10 LAB — URINALYSIS, ROUTINE W REFLEX MICROSCOPIC
Bilirubin Urine: NEGATIVE
GLUCOSE, UA: NEGATIVE mg/dL
Ketones, ur: NEGATIVE mg/dL
Nitrite: NEGATIVE
PROTEIN: NEGATIVE mg/dL
pH: 6 (ref 5.0–8.0)

## 2017-03-10 LAB — WET PREP, GENITAL
SPERM: NONE SEEN
TRICH WET PREP: NONE SEEN
YEAST WET PREP: NONE SEEN

## 2017-03-10 LAB — URINALYSIS, MICROSCOPIC (REFLEX)

## 2017-03-10 LAB — BASIC METABOLIC PANEL
ANION GAP: 7 (ref 5–15)
BUN: 11 mg/dL (ref 6–20)
CALCIUM: 8.6 mg/dL — AB (ref 8.9–10.3)
CO2: 23 mmol/L (ref 22–32)
Chloride: 107 mmol/L (ref 101–111)
Creatinine, Ser: 0.57 mg/dL (ref 0.44–1.00)
GLUCOSE: 107 mg/dL — AB (ref 65–99)
POTASSIUM: 3 mmol/L — AB (ref 3.5–5.1)
SODIUM: 137 mmol/L (ref 135–145)

## 2017-03-10 LAB — PREGNANCY, URINE: PREG TEST UR: NEGATIVE

## 2017-03-10 MED ORDER — NAPROXEN 375 MG PO TABS: 375.0000 mg | ORAL_TABLET | Freq: Two times a day (BID) | ORAL | 0 refills | Status: DC

## 2017-03-10 MED ORDER — CEFTRIAXONE SODIUM 250 MG IJ SOLR
250.0000 mg | Freq: Once | INTRAMUSCULAR | Status: AC
  Administered 2017-03-10: 250 mg via INTRAMUSCULAR
  Filled 2017-03-10: qty 250

## 2017-03-10 MED ORDER — SODIUM CHLORIDE 0.9 % IV BOLUS (SEPSIS)
1000.0000 mL | Freq: Once | INTRAVENOUS | Status: AC
  Administered 2017-03-10: 1000 mL via INTRAVENOUS

## 2017-03-10 MED ORDER — NAPROXEN 250 MG PO TABS
500.0000 mg | ORAL_TABLET | Freq: Once | ORAL | Status: AC
  Administered 2017-03-10: 500 mg via ORAL
  Filled 2017-03-10: qty 2

## 2017-03-10 MED ORDER — AZITHROMYCIN 250 MG PO TABS
1000.0000 mg | ORAL_TABLET | Freq: Once | ORAL | Status: AC
  Administered 2017-03-10: 1000 mg via ORAL
  Filled 2017-03-10: qty 4

## 2017-03-10 MED ORDER — METRONIDAZOLE 500 MG PO TABS: 500.0000 mg | ORAL_TABLET | Freq: Two times a day (BID) | ORAL | 0 refills | Status: DC

## 2017-03-10 MED ORDER — AZITHROMYCIN 250 MG PO TABS
ORAL_TABLET | ORAL | Status: AC
  Filled 2017-03-10: qty 2

## 2017-03-10 NOTE — ED Notes (Signed)
Pt reports she took ibuprofen at 2pm, prior to coming to ED

## 2017-03-10 NOTE — ED Triage Notes (Signed)
Pt reports having a uterine ablation on 10/31. States she is still having vaginal bleeding. Also reports abd cramping that started today.

## 2017-03-10 NOTE — ED Notes (Signed)
Pt given Rx x 2 for flagyl and naproxen

## 2017-03-10 NOTE — ED Provider Notes (Signed)
[place] EMERGENCY DEPARTMENT Provider Note   CSN: [id number] Arrival date & time: 03/10/17  1557     History   Chief Complaint Chief Complaint  Arias presents with  . Vaginal Bleeding    HPI [place] Arias is a 41 y.o. female.  [place] Arias is a 41 y.o. Female who presents to [place] emergency department complaining of vaginal bleeding, vaginal discharge and malodorous smell.  Arias reports she had a uterine ablation and D&C by Dr. Raphael Gibney on February 13, 2017.  She reports since then she has been having constant vaginal bleeding that is about [place] amount of her menstrual cycle.  She reports it is not heavy.  She has used about 5 pads today which she changes every time she uses [place] bathroom. She reports [place] amount of vaginal bleeding has not changed or worsened recently.   She also reports today she noticed some malodorous discharge.  She also complains of some bilateral pelvic cramping today.  She is sexually active and does not use protection.  She reports feeling lightheaded with position change today.  No chest pain or shortness of breath.  No syncope.  She denies syncope, chest pain, shortness of breath, nausea, vomiting, diarrhea, urinary frequency, urinary urgency, dysuria or rashes.   [place] history is provided by [place] Arias and medical records. No language interpreter was used.  Vaginal Bleeding  Primary symptoms include pelvic pain, vaginal bleeding.  Primary symptoms include no dysuria. Associated symptoms include light-headedness. Pertinent negatives include no abdominal pain, no diarrhea, no nausea, no vomiting and no frequency.    Past Medical History:  Diagnosis Date  . Allergy    seasonal  . Anemia    goes to [facility], ferahem  . Anxiety   . Arthritis    spinal stenosis  . Asthma   . Bipolar disorder ([place])   . Cancer [facility]) 2013   Cervical cancer  . Depression   . Fatty liver disease, nonalcoholic   . Fibromyalgia   . Food poisoning   . GERD  (gastroesophageal reflux disease)   . 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 HSV infection   . Hypertension   . Lumbar radiculopathy   . Migraines   . Neuropathy   . Obesity   . OSA (obstructive sleep apnea)   . OSA on CPAP    uses it nightly  . Peripheral neuropathy    LLE - pins and needles  . Shortness of breath dyspnea    with exertion  . Spinal stenosis     [place]as Active Problem List   Diagnosis Date Noted  . Rectal bleeding 01/02/2016  . Menorrhagia with regular cycle 01/02/2016  . BV (bacterial vaginosis) 10/23/2015  . Pelvic pain in female 10/23/2015  . CAP (community acquired pneumonia) 07/25/2015  . Asthma, chronic 07/02/2015  . OSA (obstructive sleep apnea) 07/02/2015  . Hypokalemia 07/02/2015  . Sepsis due to pneumonia ([place]) 07/02/2015  . Leukocytosis 07/02/2015  . IDA (iron deficiency anemia) 07/02/2015  . Benign essential HTN 07/12/2011  . Fibromyalgia 07/26/2009    Past Surgical History:  Procedure Laterality Date  . CARPAL TUNNEL RELEASE Right   . [place], 2013   x 3  . CESAREAN SECTION  11/28/2011   Procedure: CESAREAN SECTION;  Surgeon: Woodroe Mode, MD;  Location: [facility];  Service: Obstetrics;  Laterality: N/A;  . COLPOSCOPY    . [place] N/A 02/13/2017  Procedure: DILATATION & CURETTAGE/HYSTEROSCOPY WITH NOVASURE ABLATION;  Surgeon: Ena Dawley, MD;  Location: [facility];  Service: Gynecology;  Laterality: N/A;  . FACET JOINT INJECTION     multiple injections  . HYSTEROSCOPY W/D&C N/A 08/17/2015   Procedure: DILATATION AND CURETTAGE /HYSTEROSCOPY;  Surgeon: Ena Dawley, MD;  Location: [facility];  Service: Gynecology;  Laterality: N/A;  . MANDIBLE SURGERY  2008  . WISDOM TOOTH EXTRACTION      OB History    Gravida Para Term Preterm AB Living   3 3 3     3    SAB TAB Ectopic Multiple Live Births           3       Home Medications    Prior to Admission  medications   Medication Sig Start Date End Date Taking? Authorizing Provider  albuterol (PROAIR HFA) 108 (90 Base) MCG/ACT inhaler Inhale 2 puffs into [place] lungs every 6 (six) hours as needed for wheezing or shortness of breath. 05/28/16   Chesley Mires, MD  amLODipine (NORVASC) 10 MG tablet Take 10 mg by mouth daily.    [provider]  amoxicillin-clavulanate (AUGMENTIN) 875-125 MG tablet Take 1 tablet 2 (two) times daily by mouth. 02/25/17   Chesley Mires, MD  ARIPiprazole (ABILIFY) 2 MG tablet Take 1 tablet (2 mg total) at bedtime by mouth. 02/27/17   Merian Capron, MD  budesonide (PULMICORT) 0.5 MG/2ML nebulizer solution TAKE 2 MLS BY NEBULIZATION 2 TIMES DAILY. DX CODE: CHRONIC ASTHMA J45.909 03/05/17   Magdalen Spatz, NP  clonazePAM (KLONOPIN) 0.5 MG tablet Take 1 tablet (0.5 mg total) 2 (two) times daily as needed by mouth for anxiety. 02/27/17   Merian Capron, MD  cyclobenzaprine (FLEXERIL) 10 MG tablet Take 10 mg by mouth 3 (three) times daily.     [provider]  ferumoxytol Shirlean Kelly) 510 MG/17ML SOLN injection Inject 510 mg into [place] vein every 3 (three) months.     [provider]  FLUoxetine (PROZAC) 20 MG capsule Take 1 capsule (20 mg total) daily by mouth. 02/27/17   Merian Capron, MD  fluticasone (FLONASE) 50 MCG/ACT nasal spray PLACE 2 SPRAYS INTO BOTH NOSTRILS 2 (TWO) TIMES DAILY. 12/31/16   Parrett, Fonnie Mu, NP  furosemide (LASIX) 80 MG tablet Take 80 mg by mouth 2 (two) times daily.    [provider]  ipratropium-albuterol (DUONEB) 0.5-2.5 (3) MG/3ML SOLN TAKE 3 MLS BY NEBULIZATION EVERY 4 HOURS AS NEEDED (FOR WHEEZING/SHORTNESS OF BREATH). 03/05/17   Magdalen Spatz, NP  loratadine (CLARITIN) 10 MG tablet TAKE 1 TABLET BY MOUTH EVERY DAY Arias taking differently: TAKE 10 MG BY MOUTH at night 10/18/16   Chesley Mires, MD  metoprolol tartrate (LOPRESSOR) 25 MG tablet Take 25 mg by mouth 2 (two) times daily.     [provider]    metroNIDAZOLE (FLAGYL) 500 MG tablet Take 1 tablet (500 mg total) by mouth 2 (two) times daily. 03/10/17   Waynetta Pean, PA-C  montelukast (SINGULAIR) 10 MG tablet TAKE 1 TABLET BY MOUTH AT BEDTIME Arias taking differently: TAKE 10 MG BY MOUTH AT BEDTIME 10/18/16   Parrett, Tammy S, NP  Multiple Vitamins-Minerals (MULTIVITAMIN WITH MINERALS) tablet Take 1 tablet by mouth daily.    [provider]  naproxen (NAPROSYN) 375 MG tablet Take 1 tablet (375 mg total) by mouth 2 (two) times daily with a meal. 03/10/17   Waynetta Pean, PA-C  ondansetron (ZOFRAN) 4 MG tablet Take 1 tablet (4 mg total) by  mouth every 8 (eight) hours as needed for nausea or vomiting. 02/13/17   Ena Dawley, MD  oxyCODONE (ROXICODONE) 15 MG immediate release tablet Take 15 mg by mouth every 6 (six) hours as needed for pain.    [provider]  OXYGEN Inhale 2 L into [place] lungs at bedtime.    [provider]  pantoprazole (PROTONIX) 40 MG tablet TAKE 1 TABLET BY MOUTH EVERY DAY *NEEDS OFFICE VISIT Arias taking differently: Take 40 mg by mouth at night 01/23/17   Parrett, Tammy S, NP  potassium chloride SA (K-DUR,KLOR-CON) 20 MEQ tablet Take 1 tablet (20 mEq total) by mouth daily. Arias taking differently: Take 20 mEq by mouth 2 (two) times daily.  08/06/16   Tresea Mall, CNM  pregabalin (LYRICA) 300 MG capsule Take 300 mg by mouth 2 (two) times daily.     [provider]  rizatriptan (MAXALT) 10 MG tablet Take 10 mg by mouth as needed for migraine. May repeat in 2 hours if needed    [provider]  topiramate (TOPAMAX) 100 MG tablet Take 200 mg by mouth 2 (two) times daily.     [provider]  traMADol (ULTRAM) 50 MG tablet Take 1 tablet (50 mg total) by mouth every 6 (six) hours as needed. 02/13/17   Ena Dawley, MD  triamcinolone cream (KENALOG) 0.1 % Apply 1 application topically daily as needed (for rash).  07/23/16   [provider]     Family History Family History  Problem Relation Age of Onset  . Hypertension Mother   . Sickle cell trait Maternal Aunt   . Sickle cell anemia Other   . Schizophrenia Son   . Depression Daughter   . Kidney disease Father        transplant required  . Stomach cancer Neg Hx   . Colon cancer Neg Hx     Social History Social History   Tobacco Use  . Smoking status: Never Smoker  . Smokeless tobacco: Never Used  . Tobacco comment: Pt was exposed to 2nd hand smoke at work years ago.  Substance Use Topics  . Alcohol use: No  . Drug use: No     Allergies   Cymbalta [duloxetine hcl]; Latex; and Morphine and related   Review of Systems Review of Systems  Constitutional: Negative for chills and fever.  HENT: Negative for congestion and sore throat.   Eyes: Negative for visual disturbance.  Respiratory: Negative for cough, shortness of breath and wheezing.   Cardiovascular: Negative for chest pain and palpitations.  Gastrointestinal: Negative for abdominal pain, diarrhea, nausea and vomiting.  Genitourinary: Positive for pelvic pain, vaginal bleeding and vaginal discharge. Negative for difficulty urinating, dysuria, flank pain, frequency, genital sores and urgency.  Musculoskeletal: Negative for back pain and neck pain.  Skin: Negative for rash.  Neurological: Positive for light-headedness. Negative for syncope, weakness and headaches.     Physical Exam Updated Vital Signs BP (!) 142/82 (BP Location: Left Arm)   Pulse (!) 102   Temp 98.6 F (37 C) (Oral)   Resp 20   Ht 5\' 2"  (1.575 m)   Wt 127 kg (280 lb)   LMP 02/25/2017   SpO2 100%   BMI 51.21 kg/m   Physical Exam  Constitutional: She appears well-developed and well-nourished. No distress.  Nontoxic appearing.  Obese female.  HENT:  Head: Normocephalic and atraumatic.  Mouth/Throat: Oropharynx is clear and moist.  Eyes: Conjunctivae are normal. Pupils are equal, round, and reactive  to light. Right eye  exhibits no discharge. Left eye exhibits no discharge.  Neck: Neck supple.  Cardiovascular: Regular rhythm, normal heart sounds and intact distal pulses. Exam reveals no gallop and no friction rub.  No murmur heard. Heart rate 106 on exam.  Pulmonary/Chest: Effort normal and breath sounds normal. No respiratory distress. She has no wheezes. She has no rales.  Abdominal: Soft. Bowel sounds are normal. She exhibits no distension and no mass. There is no tenderness. There is no rebound and no guarding.  Abdomen is soft and nontender to palpation.  Genitourinary:  Genitourinary Comments: Exam performed with female RN as chaperone.  No external lesions or rashes noted.  Brown vaginal discharge noted.  Cervix is closed.  No cervical motion tenderness.  No adnexal tenderness or fullness.  Musculoskeletal: She exhibits no edema.  Lymphadenopathy:    She has no cervical adenopathy.  Neurological: She is alert. Coordination normal.  Skin: Skin is warm and dry. No rash noted. She is not diaphoretic. No erythema. No pallor.  Psychiatric: She has a normal mood and affect. Her behavior is normal.  Nursing note and vitals reviewed.    ED Treatments / Results  Labs (all labs ordered are listed, but only abnormal results are displayed) Labs Reviewed  WET PREP, GENITAL - Abnormal; Notable for [place] following components:      Result Value   Clue Cells Wet Prep HPF POC PRESENT (*)    WBC, Wet Prep HPF POC MANY (*)    All other components within normal limits  BASIC METABOLIC PANEL - Abnormal; Notable for [place] following components:   Potassium 3.0 (*)    Glucose, Bld 107 (*)    Calcium 8.6 (*)    All other components within normal limits  CBC WITH DIFFERENTIAL/PLATELET - Abnormal; Notable for [place] following components:   WBC 12.8 (*)    Hemoglobin 10.1 (*)    HCT 33.0 (*)    MCV 72.7 (*)    MCH 22.2 (*)    RDW 22.4 (*)    Neutro Abs 9.3 (*)    All other components within normal limits  URINALYSIS,  ROUTINE W REFLEX MICROSCOPIC - Abnormal; Notable for [place] following components:   Specific Gravity, Urine >1.030 (*)    Hgb urine dipstick SMALL (*)    Leukocytes, UA TRACE (*)    All other components within normal limits  URINALYSIS, MICROSCOPIC (REFLEX) - Abnormal; Notable for [place] following components:   Bacteria, UA MANY (*)    Squamous Epithelial / LPF 0-5 (*)    All other components within normal limits  PREGNANCY, URINE  RPR  HIV ANTIBODY (ROUTINE TESTING)  GC/CHLAMYDIA PROBE AMP () NOT AT [facility]    EKG  EKG Interpretation None       Radiology No results found.  Procedures Procedures (including critical care time)  Medications Ordered in ED Medications  naproxen (NAPROSYN) tablet 500 mg (not administered)  azithromycin (ZITHROMAX) tablet 1,000 mg (not administered)  cefTRIAXone (ROCEPHIN) injection 250 mg (not administered)  sodium chloride 0.9 % bolus 1,000 mL (0 mLs Intravenous Stopped 03/10/17 1851)     Initial Impression / Assessment and Plan / ED Course  I have reviewed [place] triage vital signs and [place] nursing notes.  Pertinent labs & imaging results that were available during my care of [place] Arias were reviewed by me and considered in my medical decision making (see chart for details).     This is a 41 y.o. Female who presents  to [place] emergency department complaining of vaginal bleeding, vaginal discharge and malodorous smell.  Arias reports she had a uterine ablation and D&C by Dr. Raphael Gibney on February 13, 2017.  She reports since then she has been having constant vaginal bleeding that is about [place] amount of her menstrual cycle.  She reports it is not heavy.  She has used about 5 pads today which she changes every time she uses [place] bathroom. She reports [place] amount of vaginal bleeding has not changed or worsened recently.   She also reports today she noticed some malodorous discharge.  She also complains of some bilateral pelvic cramping today.  She is  sexually active and does not use protection.  She reports feeling lightheaded with position change today.  No chest pain or shortness of breath.  No syncope. On exam [place] Arias is afebrile and non-toxic appearing.  Her abdomen is soft and nontender to palpation.  On pelvic exam she has brownish discharge.  No significant vaginal bleeding noted.  Cervix is closed.  No cervical motion tenderness. Pregnancy test is negative. CBC shows a hemoglobin of 10.1 which is unchanged from her recent blood work. Wet prep shows clue cells and many white blood cells. At reevaluation following fluid bolus Arias reports she is no longer feeling lightheaded and has ambulated to [place] bathroom without difficulty.  Her heart rate has improved. We will discharge after providing [place] Arias with Rocephin and azithromycin to empirically treat for STDs.  I advised that she has pending testing for gonorrhea, chlamydia, HIV and syphilis.  I encouraged her to follow up on these test results in my chart in about 3 days.  Also encouraged follow-up with [place] health department or with her OB/GYN.  Safe sex practices discussed.  We will also discharged with Flagyl for bacterial vaginosis. I advised [place] Arias to follow-up with their primary care provider this week. I advised [place] Arias to return to [place] emergency department with new or worsening symptoms or new concerns. [place] Arias verbalized understanding and agreement with plan.      Final Clinical Impressions(s) / ED Diagnoses   Final diagnoses:  Concern about STD in female without diagnosis  Vaginal bleeding  BV (bacterial vaginosis)    ED Discharge Orders        Ordered    metroNIDAZOLE (FLAGYL) 500 MG tablet  2 times daily     03/10/17 1951    naproxen (NAPROSYN) 375 MG tablet  2 times daily with meals     03/10/17 1951       Waynetta Pean, PA-C 03/10/17 1959    Margette Fast, MD 03/11/17 1202

## 2017-03-10 NOTE — ED Notes (Signed)
Alert, NAD, calm, interactive, resps e/u, speaking in clear complete sentences, no dyspnea noted, skin W&D, (denies: sob, nausea, dizziness or visual changes).

## 2017-03-12 LAB — GC/CHLAMYDIA PROBE AMP (~~LOC~~) NOT AT ARMC
CHLAMYDIA, DNA PROBE: NEGATIVE
NEISSERIA GONORRHEA: NEGATIVE

## 2017-03-12 LAB — HIV ANTIBODY (ROUTINE TESTING W REFLEX): HIV SCREEN 4TH GENERATION: NONREACTIVE

## 2017-03-12 LAB — RPR: RPR Ser Ql: NONREACTIVE

## 2017-03-15 ENCOUNTER — Telehealth (HOSPITAL_COMMUNITY): Payer: Self-pay | Admitting: Psychiatry

## 2017-03-15 DIAGNOSIS — R110 Nausea: Secondary | ICD-10-CM

## 2017-03-15 MED ORDER — CLONAZEPAM 0.5 MG PO TABS: 0.5000 mg | ORAL_TABLET | Freq: Two times a day (BID) | ORAL | 1 refills | Status: DC | PRN

## 2017-03-15 NOTE — Telephone Encounter (Signed)
Change to bid and count 60. Fax to let pharmacy know of klonopine

## 2017-03-15 NOTE — Telephone Encounter (Signed)
Dr De Nurse wrote rx for klonopin to take 2xs daily. However the rx is only wrote for 30 pills.   Please call [facility]

## 2017-03-19 NOTE — Telephone Encounter (Signed)
Per Dr. De Nurse prescription was changed to BID and faxed to the pharmacy on Friday 03/15/17. Nothing further is needed at this time.

## 2017-03-22 ENCOUNTER — Other Ambulatory Visit: Payer: Self-pay

## 2017-03-22 ENCOUNTER — Encounter (HOSPITAL_COMMUNITY): Payer: Self-pay | Admitting: Emergency Medicine

## 2017-03-26 ENCOUNTER — Ambulatory Visit: Payer: 59 | Admitting: Licensed Clinical Social Worker

## 2017-03-27 ENCOUNTER — Encounter: Payer: Self-pay | Admitting: Pulmonary Disease

## 2017-04-01 ENCOUNTER — Other Ambulatory Visit: Payer: Self-pay

## 2017-04-01 ENCOUNTER — Ambulatory Visit (HOSPITAL_COMMUNITY)
Admission: RE | Admit: 2017-04-01 | Discharge: 2017-04-01 | Disposition: A | Discharge status: Home / Self Care | Payer: 59 | Source: Ambulatory Visit | Attending: Gastroenterology | Admitting: Gastroenterology

## 2017-04-01 ENCOUNTER — Encounter (HOSPITAL_COMMUNITY): Payer: Self-pay | Admitting: *Deleted

## 2017-04-01 ENCOUNTER — Ambulatory Visit (HOSPITAL_COMMUNITY): Payer: 59 | Admitting: Certified Registered"

## 2017-04-01 ENCOUNTER — Encounter (HOSPITAL_COMMUNITY)
Admission: RE | Disposition: A | Discharge status: Home / Self Care | Payer: Self-pay | Source: Ambulatory Visit | Attending: Gastroenterology

## 2017-04-01 DIAGNOSIS — M797 Fibromyalgia: Secondary | ICD-10-CM | POA: Insufficient documentation

## 2017-04-01 DIAGNOSIS — K640 First degree hemorrhoids: Secondary | ICD-10-CM | POA: Diagnosis not present

## 2017-04-01 DIAGNOSIS — G43909 Migraine, unspecified, not intractable, without status migrainosus: Secondary | ICD-10-CM | POA: Diagnosis not present

## 2017-04-01 DIAGNOSIS — Z79899 Other long term (current) drug therapy: Secondary | ICD-10-CM | POA: Diagnosis not present

## 2017-04-01 DIAGNOSIS — I10 Essential (primary) hypertension: Secondary | ICD-10-CM | POA: Diagnosis not present

## 2017-04-01 DIAGNOSIS — K219 Gastro-esophageal reflux disease without esophagitis: Secondary | ICD-10-CM | POA: Insufficient documentation

## 2017-04-01 DIAGNOSIS — Z7951 Long term (current) use of inhaled steroids: Secondary | ICD-10-CM | POA: Insufficient documentation

## 2017-04-01 DIAGNOSIS — Z9981 Dependence on supplemental oxygen: Secondary | ICD-10-CM | POA: Insufficient documentation

## 2017-04-01 DIAGNOSIS — F419 Anxiety disorder, unspecified: Secondary | ICD-10-CM | POA: Diagnosis not present

## 2017-04-01 DIAGNOSIS — J45909 Unspecified asthma, uncomplicated: Secondary | ICD-10-CM | POA: Diagnosis not present

## 2017-04-01 DIAGNOSIS — G4733 Obstructive sleep apnea (adult) (pediatric): Secondary | ICD-10-CM | POA: Diagnosis not present

## 2017-04-01 DIAGNOSIS — K760 Fatty (change of) liver, not elsewhere classified: Secondary | ICD-10-CM | POA: Diagnosis not present

## 2017-04-01 DIAGNOSIS — F329 Major depressive disorder, single episode, unspecified: Secondary | ICD-10-CM | POA: Insufficient documentation

## 2017-04-01 DIAGNOSIS — D500 Iron deficiency anemia secondary to blood loss (chronic): Secondary | ICD-10-CM

## 2017-04-01 DIAGNOSIS — Z8541 Personal history of malignant neoplasm of cervix uteri: Secondary | ICD-10-CM | POA: Diagnosis not present

## 2017-04-01 DIAGNOSIS — G629 Polyneuropathy, unspecified: Secondary | ICD-10-CM | POA: Diagnosis not present

## 2017-04-01 DIAGNOSIS — N920 Excessive and frequent menstruation with regular cycle: Secondary | ICD-10-CM | POA: Diagnosis not present

## 2017-04-01 DIAGNOSIS — D509 Iron deficiency anemia, unspecified: Secondary | ICD-10-CM | POA: Diagnosis not present

## 2017-04-01 DIAGNOSIS — R079 Chest pain, unspecified: Secondary | ICD-10-CM | POA: Insufficient documentation

## 2017-04-01 DIAGNOSIS — K921 Melena: Secondary | ICD-10-CM | POA: Insufficient documentation

## 2017-04-01 DIAGNOSIS — R109 Unspecified abdominal pain: Secondary | ICD-10-CM | POA: Diagnosis not present

## 2017-04-01 DIAGNOSIS — K5900 Constipation, unspecified: Secondary | ICD-10-CM | POA: Insufficient documentation

## 2017-04-01 DIAGNOSIS — Z6841 Body Mass Index (BMI) 40.0 and over, adult: Secondary | ICD-10-CM | POA: Insufficient documentation

## 2017-04-01 HISTORY — PX: COLONOSCOPY: SHX5424

## 2017-04-01 SURGERY — COLONOSCOPY
Anesthesia: Monitor Anesthesia Care

## 2017-04-01 MED ORDER — PROPOFOL 10 MG/ML IV BOLUS
INTRAVENOUS | Status: AC
  Filled 2017-04-01: qty 40

## 2017-04-01 MED ORDER — LACTATED RINGERS IV SOLN
INTRAVENOUS | Status: AC | PRN
  Administered 2017-04-01: 1000 mL via INTRAVENOUS

## 2017-04-01 MED ORDER — SODIUM CHLORIDE 0.9 % IV SOLN: INTRAVENOUS | Status: DC

## 2017-04-01 MED ORDER — PROPOFOL 10 MG/ML IV BOLUS
INTRAVENOUS | Status: DC | PRN
  Administered 2017-04-01: 30 mg via INTRAVENOUS
  Administered 2017-04-01: 20 mg via INTRAVENOUS

## 2017-04-01 MED ORDER — LIDOCAINE 2% (20 MG/ML) 5 ML SYRINGE
INTRAMUSCULAR | Status: DC | PRN
  Administered 2017-04-01: 100 mg via INTRAVENOUS

## 2017-04-01 MED ORDER — PROPOFOL 500 MG/50ML IV EMUL
INTRAVENOUS | Status: DC | PRN
  Administered 2017-04-01: 125 ug/kg/min via INTRAVENOUS

## 2017-04-01 NOTE — Anesthesia Preprocedure Evaluation (Signed)
Anesthesia Evaluation  Patient identified by MRN, date of birth, ID band Patient awake    Reviewed: Allergy & Precautions, NPO status , Patient's Chart, lab work & pertinent test results, reviewed documented beta blocker date and time   History of Anesthesia Complications Negative for: history of anesthetic complications  Airway Mallampati: II  TM Distance: >3 FB Neck ROM: Full    Dental no notable dental hx. (+) Teeth Intact   Pulmonary asthma , sleep apnea, Continuous Positive Airway Pressure Ventilation and Oxygen sleep apnea ,    Pulmonary exam normal breath sounds clear to auscultation       Cardiovascular hypertension, Pt. on medications (-) anginaNormal cardiovascular exam Rhythm:Regular Rate:Normal  '13 ECHO: EF 65-70%, valves OK   Neuro/Psych  Headaches, PSYCHIATRIC DISORDERS Anxiety Depression Bipolar Disorder    GI/Hepatic Neg liver ROS, GERD  Medicated and Controlled,Non-alcoholic fatty liver   Endo/Other  Morbid obesity  Renal/GU negative Renal ROS     Musculoskeletal  (+) Arthritis , Fibromyalgia -  Abdominal (+) + obese,   Peds  Hematology negative hematology ROS (+)   Anesthesia Other Findings   Reproductive/Obstetrics negative OB ROS                             Anesthesia Physical  Anesthesia Plan  ASA: III  Anesthesia Plan: MAC   Post-op Pain Management:    Induction:   PONV Risk Score and Plan: 3 and Ondansetron, Dexamethasone and Midazolam  Airway Management Planned: LMA, Mask, Simple Face Mask and Natural Airway  Additional Equipment:   Intra-op Plan:   Post-operative Plan:   Informed Consent: I have reviewed the patients History and Physical, chart, labs and discussed the procedure including the risks, benefits and alternatives for the proposed anesthesia with the patient or authorized representative who has indicated his/her understanding and  acceptance.   Dental advisory given  Plan Discussed with: CRNA and Surgeon  Anesthesia Plan Comments:         Anesthesia Quick Evaluation

## 2017-04-01 NOTE — Discharge Instructions (Signed)
YOU HAD AN ENDOSCOPIC PROCEDURE TODAY: Refer to the procedure report and other information in the discharge instructions given to you for any specific questions about what was found during the examination. If this information does not answer your questions, please call [place] office at [phone] to clarify.  ° °YOU SHOULD EXPECT: Some feelings of bloating in the abdomen. Passage of more gas than usual. Walking can help get rid of the air that was put into your GI tract during the procedure and reduce the bloating. If you had a lower endoscopy (such as a colonoscopy or flexible sigmoidoscopy) you may notice spotting of blood in your stool or on the toilet paper. Some abdominal soreness may be present for a day or two, also. ° °DIET: Your first meal following the procedure should be a light meal and then it is ok to progress to your normal diet. A half-sandwich or bowl of soup is an example of a good first meal. Heavy or fried foods are harder to digest and may make you feel nauseous or bloated. Drink plenty of fluids but you should avoid alcoholic beverages for 24 hours. If you had a esophageal dilation, please see attached instructions for diet.   ° °ACTIVITY: Your care partner should take you home directly after the procedure. You should plan to take it easy, moving slowly for the rest of the day. You can resume normal activity the day after the procedure however YOU SHOULD NOT DRIVE, use power tools, machinery or perform tasks that involve climbing or major physical exertion for 24 hours (because of the sedation medicines used during the test).  ° °SYMPTOMS TO REPORT IMMEDIATELY: °A gastroenterologist can be reached at any hour. Please call [phone]  for any of the following symptoms:  °Following lower endoscopy (colonoscopy, flexible sigmoidoscopy) °Excessive amounts of blood in the stool  °Significant tenderness, worsening of abdominal pains  °Swelling of the abdomen that is new, acute  °Fever of 100° or  higher  °Following upper endoscopy (EGD, EUS, ERCP, esophageal dilation) °Vomiting of blood or coffee ground material  °New, significant abdominal pain  °New, significant chest pain or pain under the shoulder blades  °Painful or persistently difficult swallowing  °New shortness of breath  °Black, tarry-looking or red, bloody stools ° °FOLLOW UP:  °If any biopsies were taken you will be contacted by phone or by letter within the next 1-3 weeks. Call [phone]  if you have not heard about the biopsies in 3 weeks.  °Please also call with any specific questions about appointments or follow up tests. ° °

## 2017-04-01 NOTE — H&P (Signed)
History of Present Illness: This is a 41 year old female referred by Heath Lark, MD for the evaluation of iron deficiency anemia, hematochezia and alternating bowel habits.  She has menorrhagia with severe iron deficiency anemia and reactive thrombocytosis.  She is followed by Dr. Alvy Bimler.  She has received intermittent intravenous iron infusions as she could not tolerate oral iron supplements.  She relates an intermittent history of mild epistaxis.  She relates episodes of rectal bleeding for several years occurring about once or twice per month with bowel movements.  She was evaluated in the ED in 12/2015 for heavier bleeding with findings of heme + stool and anemia.  No prior colonoscopy or EGD.  She relates alternating bowel habits between constipation, looser stools and normal stools.  Variation in bowel habits is often associated with periumbilical and mid abdominal crampy pain.  This pattern has been present for a long period of time and has not changed.  She related intermittent pain below her left breast and not related to any digestive function.  Denies weight loss, abdominal pain, change in stool caliber, melena, nausea, vomiting, dysphagia, reflux symptoms.         Allergies  Allergen Reactions  . Cymbalta [Duloxetine Hcl] Other (See Comments)    Head felt cold and tight, loss of conciousness  . Latex Itching, Swelling and Other (See Comments)    Reaction:  Blisters   . Morphine And Related Itching         Outpatient Medications Prior to Visit  Medication Sig Dispense Refill  . albuterol (PROAIR HFA) 108 (90 Base) MCG/ACT inhaler Inhale 2 puffs into the lungs every 6 (six) hours as needed for wheezing or shortness of breath. 8.5 Inhaler 2  . amLODipine (NORVASC) 10 MG tablet Take 10 mg by mouth daily.    . ARIPiprazole (ABILIFY) 2 MG tablet Take 1 tablet (2 mg total) by mouth daily. 30 tablet 2  . clonazePAM (KLONOPIN) 0.5 MG tablet Take 1 tablet (0.5 mg total) by mouth  daily as needed for anxiety. 30 tablet 2  . cyclobenzaprine (FLEXERIL) 10 MG tablet Take 10 mg by mouth 3 (three) times daily.     . ferumoxytol (FERAHEME) 510 MG/17ML SOLN injection Inject 510 mg into the vein every 3 (three) months.     [name] FLUoxetine (PROZAC) 20 MG capsule Take 1 capsule (20 mg total) by mouth daily. 30 capsule 2  . fluticasone (FLONASE) 50 MCG/ACT nasal spray PLACE 2 SPRAYS INTO BOTH NOSTRILS 2 (TWO) TIMES DAILY. 16 g 5  . furosemide (LASIX) 80 MG tablet Take 80 mg by mouth 2 (two) times daily.    [name] ibuprofen (ADVIL,MOTRIN) 800 MG tablet Take 800 mg by mouth every 8 (eight) hours as needed for headache or moderate pain.     [name] ipratropium-albuterol (DUONEB) 0.5-2.5 (3) MG/3ML SOLN TAKE 3 MLS BY NEBULIZATION EVERY 4 HOURS AS NEEDED (FOR WHEEZING/SHORTNESS OF BREATH). (Patient taking differently: TAKE 3 MLS BY NEBULIZATION EVERY 6 HOURS AS NEEDED (FOR WHEEZING/SHORTNESS OF BREATH).) 360 mL 0  . loratadine (CLARITIN) 10 MG tablet TAKE 1 TABLET BY MOUTH EVERY DAY (Patient taking differently: TAKE 10 MG BY MOUTH EVERY DAY) 30 tablet 2  . metoprolol tartrate (LOPRESSOR) 25 MG tablet Take 25 mg by mouth 2 (two) times daily.     . montelukast (SINGULAIR) 10 MG tablet TAKE 1 TABLET BY MOUTH AT BEDTIME (Patient taking differently: TAKE 10 MG BY MOUTH AT BEDTIME) 30 tablet 4  . Multiple Vitamins-Minerals (MULTIVITAMIN WITH  MINERALS) tablet Take 1 tablet by mouth daily.    [name] oxyCODONE (ROXICODONE) 15 MG immediate release tablet Take 15 mg by mouth every 6 (six) hours as needed for pain.    . OXYGEN Inhale 2 L into the lungs at bedtime.    . pantoprazole (PROTONIX) 40 MG tablet TAKE 1 TABLET BY MOUTH EVERY DAY *NEEDS OFFICE VISIT (Patient taking differently: Take 40 mg by mouth once daily) 90 tablet 1  . potassium chloride SA (K-DUR,KLOR-CON) 20 MEQ tablet Take 1 tablet (20 mEq total) by mouth daily. (Patient taking differently: Take 20 mEq by mouth 2 (two) times daily. ) 7 tablet  0  . pregabalin (LYRICA) 300 MG capsule Take 300 mg by mouth 2 (two) times daily.     [name] PULMICORT 0.5 MG/2ML nebulizer solution TAKE 2 MLS BY NEBULIZATION 2 TIMES DAILY. DX CODE: CHRONIC ASTHMA J45.909 120 mL 0  . rizatriptan (MAXALT) 10 MG tablet Take 10 mg by mouth as needed for migraine. May repeat in 2 hours if needed    . topiramate (TOPAMAX) 100 MG tablet Take 200 mg by mouth 2 (two) times daily.     [name] triamcinolone cream (KENALOG) 0.1 % Apply 1 application topically daily as needed (for rash).      No facility-administered medications prior to visit.        Past Medical History:  Diagnosis Date  . Allergy    seasonal  . Anemia    goes to [facility], ferahem  . Anxiety   . Arthritis   . Asthma   . Cancer [facility]) 2013   Cervical cancer  . Depression   . Fatty liver disease, nonalcoholic   . Fibromyalgia   . Fibromyalgia   . Food poisoning   . GERD (gastroesophageal reflux disease)   . 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 Hypertension   . Lumbar radiculopathy   . Migraines   . Neuropathy   . Obesity   . OSA (obstructive sleep apnea)   . OSA on CPAP   . Peripheral neuropathy   . Shortness of breath dyspnea   . Spinal stenosis         Past Surgical History:  Procedure Laterality Date  . CARPAL TUNNEL RELEASE    . [place]   . CESAREAN SECTION  11/28/2011   Procedure: CESAREAN SECTION;  Surgeon: Woodroe Mode, MD;  Location: [facility];  Service: Obstetrics;  Laterality: N/A;  . COLPOSCOPY    . HYSTEROSCOPY W/D&C N/A 08/17/2015   Procedure: DILATATION AND CURETTAGE /HYSTEROSCOPY;  Surgeon: Ena Dawley, MD;  Location: [facility];  Service: Gynecology;  Laterality: N/A;  . MANDIBLE SURGERY  2008  . WISDOM TOOTH EXTRACTION     Social History        Social History  . Marital status: Single    Spouse name: N/A  . Number of children: 3  . Years of education: N/A         Social History Main  Topics  . Smoking status: Never Smoker  . Smokeless tobacco: Never Used     Comment: Pt was exposed to 2nd hand smoke at work years ago.  . Alcohol use No  . Drug use: No  . Sexual activity: Yes    Partners: Male    Birth control/ protection: None     Comment: 2 months ago       Other Topics Concern  . None  Social History Narrative  . None        Family History  Problem Relation Age of Onset  . Hypertension Mother   . Sickle cell trait Maternal Aunt   . Sickle cell anemia Other   . Schizophrenia Son   . Depression Daughter   . Kidney disease Father        transplant required  . Stomach cancer Neg Hx   . Colon cancer Neg Hx        Review of Systems: Pertinent positive and negative review of systems were noted in the above HPI section. All other review of systems were otherwise negative.    Physical Exam: General: Well developed, well nourished, morbidly obese, no acute distress Head: Normocephalic and atraumatic Eyes:  sclerae anicteric, EOMI Ears: Normal auditory acuity Mouth: No deformity or lesions Neck: Supple, no masses or thyromegaly Lungs: Clear throughout to auscultation Chest: She localizes her left chest pain to her lower left chest wall, although no tenderness today Heart: Regular rate and rhythm; no murmurs, rubs or bruits Abdomen: Soft, non tender and non distended. No masses, hepatosplenomegaly or hernias noted. Normal Bowel sounds Rectal: deferred to colonoscopy Musculoskeletal: Symmetrical with no gross deformities  Skin: No lesions on visible extremities Pulses:  Normal pulses noted Extremities: No clubbing, cyanosis, edema or deformities noted Neurological: Alert oriented x 4, grossly nonfocal Cervical Nodes:  No significant cervical adenopathy Inguinal Nodes: No significant inguinal adenopathy Psychological:  Alert and cooperative. Normal mood and affect  Assessment and Recommendations:  1. Iron  deficiency anemia.  Likely secondary to menorrhagia.  R/O colorectal sources of blood loss.  Uterine ablation and D&C performed on10/31.  2. Intermittent hematochezia.  Rule out hemorrhoids, colorectal neoplasms, AVMs and other disorders. Schedule colonoscopy. The risks (including bleeding, perforation, infection, missed lesions, medication reactions and possible hospitalization or surgery if complications occur), benefits, and alternatives to colonoscopy with possible biopsy and possible polypectomy were discussed with the patient and they consent to proceed.   3.  Variable bowel habits with occasional mid abdominal pain. Suspected IBS. Further evaluation with colonoscopy.  Increase daily water and daily fiber intake.  MiraLAX daily for mild constipation.  Consider addition of antispasmodic.  4. Left chest pain. This is not GI related. Further evaluation with PCP.   5. OSA on CPAP.   6. Obesity.  BMI=51.76.  Long-term weight loss, carb modified diet supervised by her PCP.  7. Fatty liver.  Long-term weight loss, carb modified diet supervised by her PCP.

## 2017-04-01 NOTE — Op Note (Signed)
[facility] Patient Name: Cheyenne Arias Procedure Date: 04/01/2017 MRN: [id number] Attending MD: Ladene Artist , MD Date of Birth: 06-04-1975 CSN: [id number] Age: 41 Admit Type: Outpatient Procedure:                Colonoscopy Indications:              Hematochezia, Iron deficiency anemia Providers:                Pricilla Riffle. Fuller Plan, MD, Jobe Igo, RN, Elspeth Cho Tech., Technician, [facility],                            CRNA Referring MD:             Heath Lark, MD Medicines:                Monitored Anesthesia Care Complications:            No immediate complications. Estimated blood loss:                            None. Estimated Blood Loss:     Estimated blood loss: none. Procedure:                Pre-Anesthesia Assessment:                           - Prior to the procedure, a History and Physical                            was performed, and patient medications and                            allergies were reviewed. The patient's tolerance of                            previous anesthesia was also reviewed. The risks                            and benefits of the procedure and the sedation                            options and risks were discussed with the patient.                            All questions were answered, and informed consent                            was obtained. Prior Anticoagulants: The patient has                            taken no previous anticoagulant or antiplatelet                            agents. ASA Grade Assessment:  III - A patient with                            severe systemic disease. After reviewing the risks                            and benefits, the patient was deemed in                            satisfactory condition to undergo the procedure.                           After obtaining informed consent, the colonoscope                            was passed under direct  vision. Throughout the                            procedure, the patient's blood pressure, pulse, and                            oxygen saturations were monitored continuously. The                            EC-[id number] ([id number]) scope was introduced through                            the anus and advanced to the the cecum, identified                            by appendiceal orifice and ileocecal valve. The                            ileocecal valve, appendiceal orifice, and rectum                            were photographed. The quality of the bowel                            preparation was good after extensive lavage and                            suctioning. The colonoscopy was performed without                            difficulty. The patient tolerated the procedure                            well. Scope In: 8:51:37 AM Scope Out: 9:06:40 AM Scope Withdrawal Time: 0 hours 12 minutes 3 seconds  Total Procedure Duration: 0 hours 15 minutes 3 seconds  Findings:      The perianal and digital rectal examinations were normal.      Internal hemorrhoids were found during retroflexion. The hemorrhoids       were  small and Grade I (internal hemorrhoids that do not prolapse).      The exam was otherwise without abnormality on direct and retroflexion       views. Impression:               - Internal hemorrhoids.                           - The examination was otherwise normal on direct                            and retroflexion views.                           - No specimens collected. Moderate Sedation:      N/A- Per Anesthesia Care Recommendation:           - Repeat colonoscopy in 10 years for screening                            purposes.                           - Patient has a contact number available for                            emergencies. The signs and symptoms of potential                            delayed complications were discussed with the                             patient. Return to normal activities tomorrow.                            Written discharge instructions were provided to the                            patient.                           - Resume previous diet.                           - Continue present medications. Procedure Code(s):        --- Professional ---                           [id number], Colonoscopy, flexible; diagnostic, including                            collection of specimen(s) by brushing or washing,                            when performed (separate procedure) Diagnosis Code(s):        --- Professional ---  K64.0, First degree hemorrhoids                           K92.1, Melena (includes Hematochezia)                           D50.9, Iron deficiency anemia, unspecified CPT copyright 2016 American Medical Association. All rights reserved. The codes documented in this report are preliminary and upon coder review may  be revised to meet current compliance requirements. Ladene Artist, MD 04/01/2017 9:10:46 AM This report has been signed electronically. Number of Addenda: 0

## 2017-04-01 NOTE — Anesthesia Postprocedure Evaluation (Signed)
Anesthesia Post Note  Patient: Cheyenne Arias  Procedure(s) Performed: COLONOSCOPY (N/A )     Patient location during evaluation: Endoscopy Anesthesia Type: MAC Level of consciousness: awake Pain management: pain level controlled Vital Signs Assessment: post-procedure vital signs reviewed and stable Respiratory status: spontaneous breathing Cardiovascular status: stable Postop Assessment: no apparent nausea or vomiting Anesthetic complications: no    Last Vitals:  Vitals:   04/01/17 0913 04/01/17 0920  BP: 138/81 (!) 148/87  Pulse: 93 88  Resp: 16 20  Temp: 36.7 C   SpO2: 99% 100%    Last Pain:  Vitals:   04/01/17 0913  TempSrc: Oral   Pain Goal:                 Ozzie Knobel JR,JOHN Makaela Cando

## 2017-04-01 NOTE — Transfer of Care (Signed)
Immediate Anesthesia Transfer of Care Note  Patient: STEHANIE EKSTROM  Procedure(s) Performed: COLONOSCOPY (N/A )  Patient Location: PACU  Anesthesia Type:MAC  Level of Consciousness: awake, alert  and oriented  Airway & Oxygen Therapy: Patient Spontanous Breathing and Patient connected to face mask oxygen  Post-op Assessment: Report given to RN and Post -op Vital signs reviewed and stable  Post vital signs: Reviewed and stable  Last Vitals:  Vitals:   04/01/17 0757 04/01/17 0913  BP: (!) 151/81 138/81  Resp: (!) 21   Temp: 37.1 C 36.7 C  SpO2: 98%     Last Pain:  Vitals:   04/01/17 0913  TempSrc: Oral         Complications: No apparent anesthesia complications

## 2017-04-03 ENCOUNTER — Encounter (HOSPITAL_COMMUNITY): Payer: Self-pay | Admitting: Gastroenterology

## 2017-04-10 DIAGNOSIS — M47816 Spondylosis without myelopathy or radiculopathy, lumbar region: Secondary | ICD-10-CM | POA: Insufficient documentation

## 2017-04-10 DIAGNOSIS — M533 Sacrococcygeal disorders, not elsewhere classified: Secondary | ICD-10-CM | POA: Insufficient documentation

## 2017-04-22 ENCOUNTER — Telehealth: Payer: Self-pay | Admitting: Pulmonary Disease

## 2017-04-22 DIAGNOSIS — R110 Nausea: Secondary | ICD-10-CM

## 2017-04-22 DIAGNOSIS — N390 Urinary tract infection, site not specified: Secondary | ICD-10-CM

## 2017-04-22 DIAGNOSIS — D259 Leiomyoma of uterus, unspecified: Secondary | ICD-10-CM

## 2017-04-22 MED ORDER — FLUTICASONE PROPIONATE 50 MCG/ACT NA SUSP: NASAL | 5 refills | Status: AC

## 2017-04-22 MED ORDER — MONTELUKAST SODIUM 10 MG PO TABS: ORAL_TABLET | ORAL | 5 refills | Status: DC

## 2017-04-22 MED ORDER — ALBUTEROL SULFATE HFA 108 (90 BASE) MCG/ACT IN AERS: 2.0000 | INHALATION_SPRAY | Freq: Four times a day (QID) | RESPIRATORY_TRACT | 5 refills | Status: DC | PRN

## 2017-04-22 MED ORDER — PANTOPRAZOLE SODIUM 40 MG PO TBEC: DELAYED_RELEASE_TABLET | ORAL | 1 refills | Status: DC

## 2017-04-22 MED ORDER — LORATADINE 10 MG PO TABS: ORAL_TABLET | ORAL | 1 refills | Status: DC

## 2017-04-22 NOTE — Telephone Encounter (Signed)
Pt is calling back [phone]

## 2017-04-22 NOTE — Telephone Encounter (Signed)
Called and spoke with pt verifying that we had the correct pharmacy. Pt verbalized that the pharmacy we had listed was the correct one.  Send Rx of all meds needed to pt's correct pharmacy. Nothing further needed.

## 2017-04-23 ENCOUNTER — Telehealth: Payer: Self-pay | Admitting: Pulmonary Disease

## 2017-04-23 NOTE — Telephone Encounter (Signed)
ONO with CPAP 03/27/17 >> test time 6 hrs 33 min.  Basal SpO2 94.4%, low SpO2 80%.  Spent 41 min with SpO2 < 88%.  Please let her know that ONO shows oxygen level still low if she only uses CPAP.    She needs to continue using 2 liters oxygen at night with CPAP.

## 2017-04-24 NOTE — Telephone Encounter (Signed)
Spoke with pt and notified of results per Dr. Sood. Pt verbalized understanding and denied any questions.  

## 2017-05-06 ENCOUNTER — Telehealth: Payer: Self-pay | Admitting: *Deleted

## 2017-05-06 NOTE — Telephone Encounter (Signed)
Pt left a message stating she needs an appt with Dr Alvy Bimler. Due to "abnormal labs". RN attempted to call PCP- Dr Bethena Midget for results, but office is closed for [place].

## 2017-05-06 NOTE — Telephone Encounter (Signed)
Message to scheduler 

## 2017-05-06 NOTE — Telephone Encounter (Signed)
Pls send scheduling msg for labs and see me 15 mins She had missed a few appt last year

## 2017-05-08 ENCOUNTER — Encounter (HOSPITAL_COMMUNITY): Payer: Self-pay | Admitting: Psychiatry

## 2017-05-08 ENCOUNTER — Ambulatory Visit (INDEPENDENT_AMBULATORY_CARE_PROVIDER_SITE_OTHER): Payer: 59 | Admitting: Psychiatry

## 2017-05-08 VITALS — BP 138/70 | Ht 62.0 in | Wt 287.0 lb

## 2017-05-08 DIAGNOSIS — F410 Panic disorder [episodic paroxysmal anxiety] without agoraphobia: Secondary | ICD-10-CM

## 2017-05-08 DIAGNOSIS — Z818 Family history of other mental and behavioral disorders: Secondary | ICD-10-CM

## 2017-05-08 DIAGNOSIS — F331 Major depressive disorder, recurrent, moderate: Secondary | ICD-10-CM | POA: Diagnosis not present

## 2017-05-08 DIAGNOSIS — F4310 Post-traumatic stress disorder, unspecified: Secondary | ICD-10-CM

## 2017-05-08 DIAGNOSIS — R110 Nausea: Secondary | ICD-10-CM

## 2017-05-08 DIAGNOSIS — F0630 Mood disorder due to known physiological condition, unspecified: Secondary | ICD-10-CM

## 2017-05-08 DIAGNOSIS — M549 Dorsalgia, unspecified: Secondary | ICD-10-CM | POA: Diagnosis not present

## 2017-05-08 MED ORDER — FLUOXETINE HCL 20 MG PO CAPS: 20.0000 mg | ORAL_CAPSULE | Freq: Every day | ORAL | 2 refills | Status: DC

## 2017-05-08 MED ORDER — ARIPIPRAZOLE 2 MG PO TABS: 2.0000 mg | ORAL_TABLET | Freq: Every day | ORAL | 2 refills | Status: DC

## 2017-05-08 MED ORDER — CLONAZEPAM 0.5 MG PO TABS: 0.5000 mg | ORAL_TABLET | Freq: Two times a day (BID) | ORAL | 1 refills | Status: DC | PRN

## 2017-05-08 NOTE — Progress Notes (Signed)
Patient ID: Cheyenne Arias, female   DOB: Aug 22, 1975, 42 y.o.   MRN: [id number]  [place] Follow up outpatient visit   Cheyenne Arias [id number] 42 y.o.  05/08/2017 8:52 AM  Chief Complaint:  Anxiety, depression. Follow up History of Present Illness:   Patient Presents for follow-up and medication management for major depressive disorder. Panic disorder. PTSD.  Patient is a single African-American female diagnosed with multiple medical conditions. She has 3 kids 2 of which are living with her 42 year old daughter and a 26-year-old son.    As per initial evaluation " Patient has been diagnosed with depression and says possible bipolar . Says that she is on Abilify 2 mg for night terrors and depression."   Doing fair. Pain and back effects mood. Son in prison Klonopin bid hellps nausea and anxiety   Medical complexity/Data: OSA use cpap Which helps her sleep. Has fibromyalgia which complicates her depression. She continues with topamax and lyrica.  Severity of depression :baseline Aggravating factors: [name] Medical conditions. Obesity Son in prison. fibromyalgia pain. One son has autism She also has a possibility of pituitary tumor that is being assessed  Modifying factors; home health Prior Suicide Attempts: No  Medical History; Past Medical History:  Diagnosis Date  . Allergy    seasonal  . Anemia    goes to [facility], ferahem  . Anxiety   . Arthritis    spinal stenosis  . Asthma   . Bipolar disorder ([place])   . Cancer [facility]) 2013   Cervical cancer  . Depression   . Fatty liver disease, nonalcoholic   . Fibromyalgia   . Food poisoning   . GERD (gastroesophageal reflux disease)   . 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 HSV infection   . Hypertension   . Lumbar radiculopathy   . Migraines   . Neuropathy   . Obesity   . OSA (obstructive sleep apnea)   . OSA on CPAP    uses it nightly  . Peripheral neuropathy    LLE - pins and needles  .  Shortness of breath dyspnea    with exertion  . Spinal stenosis     Allergies: Allergies  Allergen Reactions  . Cymbalta [Duloxetine Hcl] Other (See Comments)    Head felt cold and tight, loss of conciousness  . Latex Itching, Swelling and Other (See Comments)    Reaction:  Blisters   . Morphine And Related Itching    Medications: Outpatient Encounter Medications as of 05/08/2017  Medication Sig  . albuterol (PROAIR HFA) 108 (90 Base) MCG/ACT inhaler Inhale 2 puffs into the lungs every 6 (six) hours as needed for wheezing or shortness of breath.  [name] amLODipine (NORVASC) 10 MG tablet Take 10 mg by mouth daily.  . ARIPiprazole (ABILIFY) 2 MG tablet Take 1 tablet (2 mg total) by mouth at bedtime.  . budesonide (PULMICORT) 0.5 MG/2ML nebulizer solution TAKE 2 MLS BY NEBULIZATION 2 TIMES DAILY. DX CODE: CHRONIC ASTHMA J45.909  . clonazePAM (KLONOPIN) 0.5 MG tablet Take 1 tablet (0.5 mg total) by mouth 2 (two) times daily as needed for anxiety.  . cyclobenzaprine (FLEXERIL) 10 MG tablet Take 10 mg by mouth 3 (three) times daily.   . ferumoxytol (FERAHEME) 510 MG/17ML SOLN injection Inject 510 mg into the vein every 3 (three) months.   [name] FLUoxetine (PROZAC) 20 MG capsule Take 1 capsule (20 mg total) by mouth daily.  . fluticasone (FLONASE) 50 MCG/ACT nasal spray PLACE  2 SPRAYS INTO BOTH NOSTRILS 2 (TWO) TIMES DAILY.  . furosemide (LASIX) 80 MG tablet Take 80 mg by mouth 2 (two) times daily.  [name] ibuprofen (ADVIL,MOTRIN) 800 MG tablet Take 800 mg by mouth 3 (three) times daily as needed for moderate pain.  [name] ipratropium-albuterol (DUONEB) 0.5-2.5 (3) MG/3ML SOLN TAKE 3 MLS BY NEBULIZATION EVERY 4 HOURS AS NEEDED (FOR WHEEZING/SHORTNESS OF BREATH).  [name] loratadine (CLARITIN) 10 MG tablet TAKE 10 MG BY MOUTH at night  . metoprolol tartrate (LOPRESSOR) 25 MG tablet Take 25 mg by mouth 2 (two) times daily.   . montelukast (SINGULAIR) 10 MG tablet TAKE 10 MG BY MOUTH AT BEDTIME  . Multiple  Vitamins-Minerals (MULTIVITAMIN WITH MINERALS) tablet Take 1 tablet by mouth daily.  . ondansetron (ZOFRAN) 4 MG tablet Take 1 tablet (4 mg total) by mouth every 8 (eight) hours as needed for nausea or vomiting.  [name] oxyCODONE (ROXICODONE) 15 MG immediate release tablet Take 15 mg by mouth every 6 (six) hours as needed for pain.  . OXYGEN Inhale 2 L into the lungs at bedtime.  . pantoprazole (PROTONIX) 40 MG tablet Take 40 mg by mouth at night  . potassium chloride SA (K-DUR,KLOR-CON) 20 MEQ tablet Take 1 tablet (20 mEq total) by mouth daily. (Patient taking differently: Take 20 mEq by mouth 2 (two) times daily. )  . pregabalin (LYRICA) 300 MG capsule Take 300 mg by mouth 2 (two) times daily.   . rizatriptan (MAXALT) 10 MG tablet Take 10 mg by mouth as needed for migraine. May repeat in 2 hours if needed  . topiramate (TOPAMAX) 100 MG tablet Take 100 mg by mouth 2 (two) times daily.   [name] triamcinolone cream (KENALOG) 0.1 % Apply 1 application topically daily as needed (for rash).   . [DISCONTINUED] ARIPiprazole (ABILIFY) 2 MG tablet Take 1 tablet (2 mg total) at bedtime by mouth.  . [DISCONTINUED] clonazePAM (KLONOPIN) 0.5 MG tablet Take 1 tablet (0.5 mg total) by mouth 2 (two) times daily as needed for anxiety.  . [DISCONTINUED] FLUoxetine (PROZAC) 20 MG capsule Take 1 capsule (20 mg total) daily by mouth.   No facility-administered encounter medications on file as of 05/08/2017.      Family History; Family History  Problem Relation Age of Onset  . Hypertension Mother   . Sickle cell trait Maternal Aunt   . Sickle cell anemia Other   . Schizophrenia Son   . Depression Daughter   . Kidney disease Father        transplant required  . Stomach cancer Neg Hx   . Colon cancer Neg Hx        Labs:  Recent Results (from the past 2160 hour(s))  Basic metabolic panel     Status: None   Collection Time: 02/12/17  9:20 AM  Result Value Ref Range   Sodium 138 135 - 145 mmol/L   Potassium 3.6  3.5 - 5.1 mmol/L   Chloride 110 101 - 111 mmol/L   CO2 22 22 - 32 mmol/L   Glucose, Bld 99 65 - 99 mg/dL   BUN 13 6 - 20 mg/dL   Creatinine, Ser 0.73 0.44 - 1.00 mg/dL   Calcium 9.0 8.9 - 10.3 mg/dL   GFR calc non Af Amer >60 >60 mL/min   GFR calc Af Amer >60 >60 mL/min    Comment: (NOTE) The eGFR has been calculated using the CKD EPI equation. This calculation has not been validated in all clinical situations. eGFR's persistently <  60 mL/min signify possible Chronic Kidney Disease.    Anion gap 6 5 - 15  CBC     Status: Abnormal   Collection Time: 02/12/17  9:20 AM  Result Value Ref Range   WBC 9.0 4.0 - 10.5 K/uL   RBC 4.48 3.87 - 5.11 MIL/uL   Hemoglobin 10.1 (L) 12.0 - 15.0 g/dL   HCT 32.7 (L) 36.0 - 46.0 %   MCV 73.0 (L) 78.0 - 100.0 fL   MCH 22.5 (L) 26.0 - 34.0 pg   MCHC 30.9 30.0 - 36.0 g/dL   RDW 25.6 (H) 11.5 - 15.5 %   Platelets 349 150 - 400 K/uL  Pregnancy, urine     Status: None   Collection Time: 02/13/17  8:00 AM  Result Value Ref Range   Preg Test, Ur NEGATIVE NEGATIVE    Comment:        THE SENSITIVITY OF THIS METHODOLOGY IS >20 mIU/mL.   GC/Chlamydia probe amp     Status: None   Collection Time: 03/10/17 12:00 AM  Result Value Ref Range   Chlamydia Negative     Comment: Normal Reference Range - Negative   Neisseria gonorrhea Negative     Comment: Normal Reference Range - Negative  Wet prep, genital     Status: Abnormal   Collection Time: 03/10/17  5:27 PM  Result Value Ref Range   Yeast Wet Prep HPF POC NONE SEEN NONE SEEN   Trich, Wet Prep NONE SEEN NONE SEEN   Clue Cells Wet Prep HPF POC PRESENT (A) NONE SEEN   WBC, Wet Prep HPF POC MANY (A) NONE SEEN   Sperm NONE SEEN   Basic metabolic panel     Status: Abnormal   Collection Time: 03/10/17  5:35 PM  Result Value Ref Range   Sodium 137 135 - 145 mmol/L   Potassium 3.0 (L) 3.5 - 5.1 mmol/L   Chloride 107 101 - 111 mmol/L   CO2 23 22 - 32 mmol/L   Glucose, Bld 107 (H) 65 - 99 mg/dL   BUN 11  6 - 20 mg/dL   Creatinine, Ser 0.57 0.44 - 1.00 mg/dL   Calcium 8.6 (L) 8.9 - 10.3 mg/dL   GFR calc non Af Amer >60 >60 mL/min   GFR calc Af Amer >60 >60 mL/min    Comment: (NOTE) The eGFR has been calculated using the CKD EPI equation. This calculation has not been validated in all clinical situations. eGFR's persistently <60 mL/min signify possible Chronic Kidney Disease.    Anion gap 7 5 - 15  CBC with Differential     Status: Abnormal   Collection Time: 03/10/17  5:35 PM  Result Value Ref Range   WBC 12.8 (H) 4.0 - 10.5 K/uL   RBC 4.54 3.87 - 5.11 MIL/uL   Hemoglobin 10.1 (L) 12.0 - 15.0 g/dL   HCT 33.0 (L) 36.0 - 46.0 %   MCV 72.7 (L) 78.0 - 100.0 fL   MCH 22.2 (L) 26.0 - 34.0 pg   MCHC 30.6 30.0 - 36.0 g/dL   RDW 22.4 (H) 11.5 - 15.5 %   Platelets 363 150 - 400 K/uL   Neutrophils Relative % 73 %   Lymphocytes Relative 18 %   Monocytes Relative 7 %   Eosinophils Relative 2 %   Basophils Relative 0 %   Neutro Abs 9.3 (H) 1.7 - 7.7 K/uL   Lymphs Abs 2.3 0.7 - 4.0 K/uL   Monocytes Absolute 0.9 0.1 - 1.0  K/uL   Eosinophils Absolute 0.3 0.0 - 0.7 K/uL   Basophils Absolute 0.0 0.0 - 0.1 K/uL   Smear Review MORPHOLOGY UNREMARKABLE   RPR     Status: None   Collection Time: 03/10/17  5:35 PM  Result Value Ref Range   RPR Ser Ql Non Reactive Non Reactive    Comment: (NOTE) Performed At: [facility] [place], [place] [id number] Rush Farmer MD [id number]   HIV antibody     Status: None   Collection Time: 03/10/17  5:35 PM  Result Value Ref Range   HIV Screen 4th Generation wRfx Non Reactive Non Reactive    Comment: (NOTE) Performed At: [facility] [place], [place] [id number] Rush Farmer MD [id number]   Pregnancy, urine     Status: None   Collection Time: 03/10/17  6:57 PM  Result Value Ref Range   Preg Test, Ur NEGATIVE NEGATIVE    Comment:        THE SENSITIVITY OF THIS METHODOLOGY IS >20 mIU/mL.    Urinalysis, Routine w reflex microscopic     Status: Abnormal   Collection Time: 03/10/17  6:57 PM  Result Value Ref Range   Color, Urine YELLOW YELLOW   APPearance CLEAR CLEAR   Specific Gravity, Urine >1.030 (H) 1.005 - 1.030   pH 6.0 5.0 - 8.0   Glucose, UA NEGATIVE NEGATIVE mg/dL   Hgb urine dipstick SMALL (A) NEGATIVE   Bilirubin Urine NEGATIVE NEGATIVE   Ketones, ur NEGATIVE NEGATIVE mg/dL   Protein, ur NEGATIVE NEGATIVE mg/dL   Nitrite NEGATIVE NEGATIVE   Leukocytes, UA TRACE (A) NEGATIVE  Urinalysis, Microscopic (reflex)     Status: Abnormal   Collection Time: 03/10/17  6:57 PM  Result Value Ref Range   RBC / HPF 0-5 0 - 5 RBC/hpf   WBC, UA 0-5 0 - 5 WBC/hpf   Bacteria, UA MANY (A) NONE SEEN   Squamous Epithelial / LPF 0-5 (A) NONE SEEN   Mucus PRESENT    Ca Oxalate Crys, UA PRESENT         Mental Status Examination;   Psychiatric Specialty Exam: Physical Exam  Constitutional: She appears well-nourished.  Skin: She is not diaphoretic.    Review of Systems  Constitutional: Positive for malaise/fatigue. Negative for fever.  Cardiovascular: Negative for palpitations.  Gastrointestinal: Negative for nausea and vomiting.  Skin: Negative for rash.  Psychiatric/Behavioral: Negative for substance abuse and suicidal ideas.    Blood pressure 138/70, height '5\' 2"'$  (1.575 m), weight 287 lb (130.2 kg).Body mass index is 52.49 kg/m.  General Appearance: Casual  Eye Contact::  Fair  Speech:  Slow  Volume:  Normal  Mood:  fair  Affect:  Somewhat reactive  Thought Process:  Coherent  Orientation:  Full (Time, Place, and Person)  Thought Content:  Rumination  Suicidal Thoughts:  No  Homicidal Thoughts:  No  Memory:  Immediate;   Fair Recent;   Fair  Judgement:  Fair  Insight:  Fair  Psychomotor Activity:  Normal  Concentration:  Fair  Recall:  Fair  Akathisia:  Negative  Handed:  Right  AIMS (if indicated):     Assets:  Communication Skills Desire for  Improvement Financial Resources/Insurance Housing  Sleep:        Assessment: Axis I: Major depressive disorder recurrent moderate. Panic disorder. Mood disorder otherwise nonspecified. Possible related  secondary to general medical condition including chronic iback pain  Axis II: Deferred   Axis III:  Past Medical History:  Diagnosis Date  . Allergy    seasonal  . Anemia    goes to [facility], ferahem  . Anxiety   . Arthritis    spinal stenosis  . Asthma   . Bipolar disorder ([place])   . Cancer [facility]) 2013   Cervical cancer  . Depression   . Fatty liver disease, nonalcoholic   . Fibromyalgia   . Food poisoning   . GERD (gastroesophageal reflux disease)   . 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 HSV infection   . Hypertension   . Lumbar radiculopathy   . Migraines   . Neuropathy   . Obesity   . OSA (obstructive sleep apnea)   . OSA on CPAP    uses it nightly  . Peripheral neuropathy    LLE - pins and needles  . Shortness of breath dyspnea    with exertion  . Spinal stenosis     Axis IV: Psychosocial, single mom, multiple medical conditions.   Treatment Plan and Summary:  Mood disorder:not worse. Gets subuded. Continue ability and prozac  Continue abilify for night terrors and depression  No side effects Panic disorder;infrequent. Continue klonopine bid  fu 3 months. Provided supportive therapy   Merian Capron, MD 05/08/2017

## 2017-05-09 ENCOUNTER — Telehealth: Payer: Self-pay | Admitting: Hematology and Oncology

## 2017-05-09 NOTE — Telephone Encounter (Signed)
Left message for patient regarding upcoming February appointments per 1/21 sch message °

## 2017-05-21 ENCOUNTER — Inpatient Hospital Stay (HOSPITAL_BASED_OUTPATIENT_CLINIC_OR_DEPARTMENT_OTHER): Payer: 59 | Admitting: Hematology and Oncology

## 2017-05-21 ENCOUNTER — Inpatient Hospital Stay: Payer: 59 | Attending: Hematology and Oncology

## 2017-05-21 DIAGNOSIS — D509 Iron deficiency anemia, unspecified: Secondary | ICD-10-CM | POA: Diagnosis present

## 2017-05-21 DIAGNOSIS — D72829 Elevated white blood cell count, unspecified: Secondary | ICD-10-CM | POA: Diagnosis not present

## 2017-05-21 DIAGNOSIS — D72825 Bandemia: Secondary | ICD-10-CM

## 2017-05-21 DIAGNOSIS — D473 Essential (hemorrhagic) thrombocythemia: Secondary | ICD-10-CM

## 2017-05-21 DIAGNOSIS — D75838 Other thrombocytosis: Secondary | ICD-10-CM

## 2017-05-21 DIAGNOSIS — R7989 Other specified abnormal findings of blood chemistry: Secondary | ICD-10-CM

## 2017-05-21 LAB — CBC WITH DIFFERENTIAL (CANCER CENTER ONLY)
BASOS ABS: 0 10*3/uL (ref 0.0–0.1)
Basophils Relative: 0 %
Eosinophils Absolute: 0.3 10*3/uL (ref 0.0–0.5)
Eosinophils Relative: 2 %
HCT: 38.4 % (ref 34.8–46.6)
Hemoglobin: 11.5 g/dL — ABNORMAL LOW (ref 11.6–15.9)
LYMPHS PCT: 21 %
Lymphs Abs: 2.8 10*3/uL (ref 0.9–3.3)
MCH: 21.5 pg — ABNORMAL LOW (ref 25.1–34.0)
MCHC: 29.9 g/dL — ABNORMAL LOW (ref 31.5–36.0)
MCV: 71.6 fL — AB (ref 79.5–101.0)
MONO ABS: 0.5 10*3/uL (ref 0.1–0.9)
Monocytes Relative: 3 %
NEUTROS ABS: 9.9 10*3/uL — AB (ref 1.5–6.5)
NEUTROS PCT: 74 %
Platelet Count: 436 10*3/uL — ABNORMAL HIGH (ref 145–400)
RBC: 5.36 MIL/uL (ref 3.70–5.45)
RDW: 17.9 % — AB (ref 11.2–14.5)
WBC Count: 13.5 10*3/uL — ABNORMAL HIGH (ref 3.9–10.3)

## 2017-05-21 LAB — RETICULOCYTES
RBC.: 5.36 MIL/uL (ref 3.70–5.45)
RETIC COUNT ABSOLUTE: 53.6 10*3/uL (ref 33.7–90.7)
Retic Ct Pct: 1 % (ref 0.7–2.1)

## 2017-05-21 LAB — FERRITIN: FERRITIN: 69 ng/mL (ref 9–269)

## 2017-05-21 LAB — SEDIMENTATION RATE: Sed Rate: 25 mm/hr — ABNORMAL HIGH (ref 0–22)

## 2017-05-22 ENCOUNTER — Encounter: Payer: Self-pay | Admitting: Hematology and Oncology

## 2017-05-22 DIAGNOSIS — D75838 Other thrombocytosis: Secondary | ICD-10-CM | POA: Insufficient documentation

## 2017-05-22 DIAGNOSIS — R7989 Other specified abnormal findings of blood chemistry: Secondary | ICD-10-CM | POA: Insufficient documentation

## 2017-05-22 NOTE — Assessment & Plan Note (Addendum)
She has persistent iron deficiency anemia She is not able to tolerate oral iron supplement I will bring her back for further intravenous iron infusion with plan to see her back again in 4 months The most likely cause of her anemia is due to chronic blood loss/malabsorption syndrome. We discussed some of the risks, benefits, and alternatives of intravenous iron infusions. The patient is symptomatic from anemia and the iron level is critically low. She tolerated oral iron supplement poorly and desires to achieved higher levels of iron faster for adequate hematopoesis. Some of the side-effects to be expected including risks of infusion reactions, phlebitis, headaches, nausea and fatigue.  The patient is willing to proceed. Patient education material was dispensed.  Goal is to keep ferritin level greater than 100 and resolution of anemia

## 2017-05-22 NOTE — Progress Notes (Signed)
[place] OFFICE PROGRESS NOTE  Josephina Gip, NP SUMMARY OF HEMATOLOGIC HISTORY:  This patient have chronic fatigue, fibromyalgia, uterine fibroids with chronic menorrhagia, severe iron deficiency and reactive thrombocytosis. She had chronic microcytic anemia for many years. In fact, there were no changes with MCV and the highest hemoglobin was 10.7. She had received intermittent intravenous iron infusion in the past, her last infusion was in January 2014. The patient could not tolerate oral iron supplements. She have chronic fatigue with fibromyalgia, obstructive sleep apnea and have been oxygen therapy for 2 years. She takes regular ibuprofen for pelvic pain. She has irregular menstruation and abnormal Pap smear in the past The patient had history of intermittent epistaxis for many years, usually coming from the left nasal passage. The patient has severe microcytic anemia. She received multiple doses of intravenous iron, last given in 2018 INTERVAL HISTORY: Cheyenne Arias 42 y.o. female returns for further follow-up.  She complained of fatigue.  She was seen by her primary doctor recently and she is concerned she might have autoimmune condition because of abnormal CBC.  She had recent joint injection for joint pain Since she had endometrial ablation, she stopped having periods. She denies pica The patient denies any recent signs or symptoms of bleeding such as spontaneous epistaxis, hematuria or hematochezia.   I have reviewed the past medical history, past surgical history, social history and family history with the patient and they are unchanged from previous note.  ALLERGIES:  is allergic to cymbalta [duloxetine hcl]; latex; and morphine and related.  MEDICATIONS:  Current Outpatient Medications  Medication Sig Dispense Refill  . albuterol (PROAIR HFA) 108 (90 Base) MCG/ACT inhaler Inhale 2 puffs into the lungs every 6 (six) hours as needed for wheezing or  shortness of breath. 8.5 Inhaler 5  . amLODipine (NORVASC) 10 MG tablet Take 10 mg by mouth daily.    . ARIPiprazole (ABILIFY) 2 MG tablet Take 1 tablet (2 mg total) by mouth at bedtime. 30 tablet 2  . budesonide (PULMICORT) 0.5 MG/2ML nebulizer solution TAKE 2 MLS BY NEBULIZATION 2 TIMES DAILY. DX CODE: CHRONIC ASTHMA J45.909 120 mL 5  . clonazePAM (KLONOPIN) 0.5 MG tablet Take 1 tablet (0.5 mg total) by mouth 2 (two) times daily as needed for anxiety. 60 tablet 1  . cyclobenzaprine (FLEXERIL) 10 MG tablet Take 10 mg by mouth 3 (three) times daily.     . ferumoxytol (FERAHEME) 510 MG/17ML SOLN injection Inject 510 mg into the vein every 3 (three) months.     [name] FLUoxetine (PROZAC) 20 MG capsule Take 1 capsule (20 mg total) by mouth daily. 30 capsule 2  . fluticasone (FLONASE) 50 MCG/ACT nasal spray PLACE 2 SPRAYS INTO BOTH NOSTRILS 2 (TWO) TIMES DAILY. 16 g 5  . furosemide (LASIX) 80 MG tablet Take 80 mg by mouth 2 (two) times daily.    [name] ibuprofen (ADVIL,MOTRIN) 800 MG tablet Take 800 mg by mouth 3 (three) times daily as needed for moderate pain.    [name] ipratropium-albuterol (DUONEB) 0.5-2.5 (3) MG/3ML SOLN TAKE 3 MLS BY NEBULIZATION EVERY 4 HOURS AS NEEDED (FOR WHEEZING/SHORTNESS OF BREATH). 360 mL 5  . loratadine (CLARITIN) 10 MG tablet TAKE 10 MG BY MOUTH at night 90 tablet 1  . metoprolol tartrate (LOPRESSOR) 25 MG tablet Take 25 mg by mouth 2 (two) times daily.     . montelukast (SINGULAIR) 10 MG tablet TAKE 10 MG BY MOUTH AT BEDTIME 30 tablet 5  . Multiple Vitamins-Minerals (  MULTIVITAMIN WITH MINERALS) tablet Take 1 tablet by mouth daily.    . ondansetron (ZOFRAN) 4 MG tablet Take 1 tablet (4 mg total) by mouth every 8 (eight) hours as needed for nausea or vomiting. 10 tablet 0  . oxyCODONE (ROXICODONE) 15 MG immediate release tablet Take 15 mg by mouth every 6 (six) hours as needed for pain.    . OXYGEN Inhale 2 L into the lungs at bedtime.    . pantoprazole (PROTONIX) 40 MG tablet Take 40  mg by mouth at night 90 tablet 1  . potassium chloride SA (K-DUR,KLOR-CON) 20 MEQ tablet Take 1 tablet (20 mEq total) by mouth daily. (Patient taking differently: Take 20 mEq by mouth 2 (two) times daily. ) 7 tablet 0  . pregabalin (LYRICA) 300 MG capsule Take 300 mg by mouth 2 (two) times daily.     . rizatriptan (MAXALT) 10 MG tablet Take 10 mg by mouth as needed for migraine. May repeat in 2 hours if needed    . topiramate (TOPAMAX) 100 MG tablet Take 100 mg by mouth 2 (two) times daily.     [name] triamcinolone cream (KENALOG) 0.1 % Apply 1 application topically daily as needed (for rash).      No current facility-administered medications for this visit.      REVIEW OF SYSTEMS:   Constitutional: Denies fevers, chills or night sweats Eyes: Denies blurriness of vision Ears, nose, mouth, throat, and face: Denies mucositis or sore throat Respiratory: Denies cough, dyspnea or wheezes Cardiovascular: Denies palpitation, chest discomfort or lower extremity swelling Gastrointestinal:  Denies nausea, heartburn or change in bowel habits Skin: Denies abnormal skin rashes Lymphatics: Denies new lymphadenopathy or easy bruising Neurological:Denies numbness, tingling or new weaknesses Behavioral/Psych: Mood is stable, no new changes  All other systems were reviewed with the patient and are negative.  PHYSICAL EXAMINATION: ECOG PERFORMANCE STATUS: 1 - Symptomatic but completely ambulatory  Vitals:   05/21/17 1408  BP: (!) 156/92  Pulse: 97  Resp: 18  Temp: 98.2 F (36.8 C)  SpO2: 100%   Filed Weights   05/21/17 1408  Weight: 294 lb 3.2 oz (133.4 kg)    GENERAL:alert, no distress and comfortable.  She is morbidly obese SKIN: skin color, texture, turgor are normal, no rashes or significant lesions EYES: normal, Conjunctiva are pink and non-injected, sclera clear Musculoskeletal:no cyanosis of digits and no clubbing  NEURO: alert & oriented x 3 with fluent speech, no focal motor/sensory  deficits  LABORATORY DATA:  I have reviewed the data as listed     Component Value Date/Time   NA 137 03/10/2017 1735   NA 139 08/29/2012 1102   K 3.0 (L) 03/10/2017 1735   K 3.2 (L) 08/29/2012 1102   CL 107 03/10/2017 1735   CL 105 08/29/2012 1102   CO2 23 03/10/2017 1735   CO2 23 08/29/2012 1102   GLUCOSE 107 (H) 03/10/2017 1735   GLUCOSE 130 (H) 08/29/2012 1102   BUN 11 03/10/2017 1735   BUN 5.8 (L) 08/29/2012 1102   CREATININE 0.57 03/10/2017 1735   CREATININE 0.8 08/29/2012 1102   CALCIUM 8.6 (L) 03/10/2017 1735   CALCIUM 8.6 08/29/2012 1102   PROT 8.0 08/06/2016 2057   PROT 7.3 08/29/2012 1102   ALBUMIN 3.9 08/06/2016 2057   ALBUMIN 3.6 08/29/2012 1102   AST 31 08/06/2016 2057   AST 22 08/29/2012 1102   ALT 29 08/06/2016 2057   ALT 23 08/29/2012 1102   ALKPHOS 72 08/06/2016 2057  ALKPHOS 86 08/29/2012 1102   BILITOT 0.4 08/06/2016 2057   BILITOT 0.22 08/29/2012 1102   GFRNONAA >60 03/10/2017 1735   GFRAA >60 03/10/2017 1735    No results found for: SPEP, UPEP  Lab Results  Component Value Date   WBC 13.5 (H) 05/21/2017   NEUTROABS 9.9 (H) 05/21/2017   HGB 10.1 (L) 03/10/2017   HCT 38.4 05/21/2017   MCV 71.6 (L) 05/21/2017   PLT 436 (H) 05/21/2017      Chemistry      Component Value Date/Time   NA 137 03/10/2017 1735   NA 139 08/29/2012 1102   K 3.0 (L) 03/10/2017 1735   K 3.2 (L) 08/29/2012 1102   CL 107 03/10/2017 1735   CL 105 08/29/2012 1102   CO2 23 03/10/2017 1735   CO2 23 08/29/2012 1102   BUN 11 03/10/2017 1735   BUN 5.8 (L) 08/29/2012 1102   CREATININE 0.57 03/10/2017 1735   CREATININE 0.8 08/29/2012 1102      Component Value Date/Time   CALCIUM 8.6 (L) 03/10/2017 1735   CALCIUM 8.6 08/29/2012 1102   ALKPHOS 72 08/06/2016 2057   ALKPHOS 86 08/29/2012 1102   AST 31 08/06/2016 2057   AST 22 08/29/2012 1102   ALT 29 08/06/2016 2057   ALT 23 08/29/2012 1102   BILITOT 0.4 08/06/2016 2057   BILITOT 0.22 08/29/2012 1102       ASSESSMENT & PLAN:  IDA (iron deficiency anemia) She has persistent iron deficiency anemia She is not able to tolerate oral iron supplement I will bring her back for further intravenous iron infusion with plan to see her back again in 4 months The most likely cause of her anemia is due to chronic blood loss/malabsorption syndrome. We discussed some of the risks, benefits, and alternatives of intravenous iron infusions. The patient is symptomatic from anemia and the iron level is critically low. She tolerated oral iron supplement poorly and desires to achieved higher levels of iron faster for adequate hematopoesis. Some of the side-effects to be expected including risks of infusion reactions, phlebitis, headaches, nausea and fatigue.  The patient is willing to proceed. Patient education material was dispensed.  Goal is to keep ferritin level greater than 100 and resolution of anemia   Leukocytosis She has chronic intermittent leukocytosis with predominantly neutrophilia This is reactive in nature, likely due to recent steroid injection of her joint She does not need further follow-up  Reactive thrombocytosis The patient is concerned about possible autoimmune disease She needs to be referred to see a rheumatologist instead The pattern of reactive thrombocytosis could be due to iron deficiency As above, I plan to give her intravenous iron infusion   No orders of the defined types were placed in this encounter.   All questions were answered. The patient knows to call the clinic with any problems, questions or concerns. No barriers to learning was detected.  I spent 15 minutes counseling the patient face to face. The total time spent in the appointment was 20 minutes and more than 50% was on counseling.     Heath Lark, MD 2/6/20199:08 AM

## 2017-05-22 NOTE — Assessment & Plan Note (Addendum)
She has chronic intermittent leukocytosis with predominantly neutrophilia This is reactive in nature, likely due to recent steroid injection of her joint She does not need further follow-up

## 2017-05-22 NOTE — Assessment & Plan Note (Signed)
The patient is concerned about possible autoimmune disease She needs to be referred to see a rheumatologist instead The pattern of reactive thrombocytosis could be due to iron deficiency As above, I plan to give her intravenous iron infusion

## 2017-05-23 DIAGNOSIS — E1129 Type 2 diabetes mellitus with other diabetic kidney complication: Secondary | ICD-10-CM | POA: Insufficient documentation

## 2017-05-24 ENCOUNTER — Telehealth: Payer: Self-pay | Admitting: Hematology and Oncology

## 2017-05-24 NOTE — Telephone Encounter (Signed)
Mailed patient calendar of upcoming February and June appointments per 2/6 sch message

## 2017-05-31 ENCOUNTER — Inpatient Hospital Stay: Payer: 59

## 2017-05-31 VITALS — BP 133/54 | HR 99 | Temp 99.2°F | Resp 18

## 2017-05-31 DIAGNOSIS — D509 Iron deficiency anemia, unspecified: Secondary | ICD-10-CM | POA: Diagnosis not present

## 2017-05-31 DIAGNOSIS — R7989 Other specified abnormal findings of blood chemistry: Secondary | ICD-10-CM

## 2017-05-31 DIAGNOSIS — D75838 Other thrombocytosis: Secondary | ICD-10-CM

## 2017-05-31 MED ORDER — SODIUM CHLORIDE 0.9 % IV SOLN
510.0000 mg | Freq: Once | INTRAVENOUS | Status: AC
  Administered 2017-05-31: 510 mg via INTRAVENOUS
  Filled 2017-05-31: qty 17

## 2017-05-31 MED ORDER — SODIUM CHLORIDE 0.9 % IV SOLN
Freq: Once | INTRAVENOUS | Status: AC
  Administered 2017-05-31: 15:00:00 via INTRAVENOUS

## 2017-05-31 MED ORDER — METHYLPREDNISOLONE SODIUM SUCC 40 MG IJ SOLR
40.0000 mg | Freq: Once | INTRAMUSCULAR | Status: AC
  Administered 2017-05-31: 40 mg via INTRAVENOUS

## 2017-05-31 MED ORDER — METHYLPREDNISOLONE SODIUM SUCC 40 MG IJ SOLR
INTRAMUSCULAR | Status: AC
  Filled 2017-05-31: qty 1

## 2017-05-31 NOTE — Patient Instructions (Signed)

## 2017-06-05 DIAGNOSIS — F411 Generalized anxiety disorder: Secondary | ICD-10-CM | POA: Insufficient documentation

## 2017-06-07 ENCOUNTER — Inpatient Hospital Stay: Payer: 59

## 2017-06-07 VITALS — BP 142/80 | HR 110 | Temp 98.6°F | Resp 20

## 2017-06-07 DIAGNOSIS — D509 Iron deficiency anemia, unspecified: Secondary | ICD-10-CM | POA: Diagnosis not present

## 2017-06-07 DIAGNOSIS — D75838 Other thrombocytosis: Secondary | ICD-10-CM

## 2017-06-07 DIAGNOSIS — R7989 Other specified abnormal findings of blood chemistry: Secondary | ICD-10-CM

## 2017-06-07 MED ORDER — METHYLPREDNISOLONE SODIUM SUCC 40 MG IJ SOLR
INTRAMUSCULAR | Status: AC
  Filled 2017-06-07: qty 1

## 2017-06-07 MED ORDER — FERUMOXYTOL INJECTION 510 MG/17 ML
510.0000 mg | Freq: Once | INTRAVENOUS | Status: AC
  Administered 2017-06-07: 510 mg via INTRAVENOUS
  Filled 2017-06-07: qty 17

## 2017-06-07 MED ORDER — METHYLPREDNISOLONE SODIUM SUCC 40 MG IJ SOLR
40.0000 mg | Freq: Once | INTRAMUSCULAR | Status: AC
  Administered 2017-06-07: 40 mg via INTRAVENOUS

## 2017-06-07 MED ORDER — SODIUM CHLORIDE 0.9 % IV SOLN
Freq: Once | INTRAVENOUS | Status: AC
  Administered 2017-06-07: 16:00:00 via INTRAVENOUS

## 2017-06-07 NOTE — Patient Instructions (Signed)

## 2017-07-11 ENCOUNTER — Other Ambulatory Visit (HOSPITAL_COMMUNITY): Payer: Self-pay | Admitting: Psychiatry

## 2017-07-11 DIAGNOSIS — R110 Nausea: Secondary | ICD-10-CM

## 2017-07-22 ENCOUNTER — Encounter (HOSPITAL_COMMUNITY): Payer: Self-pay | Admitting: Psychiatry

## 2017-07-30 ENCOUNTER — Ambulatory Visit (HOSPITAL_COMMUNITY): Payer: Self-pay | Admitting: Psychiatry

## 2017-08-16 ENCOUNTER — Other Ambulatory Visit (HOSPITAL_COMMUNITY): Payer: Self-pay | Admitting: Psychiatry

## 2017-08-16 DIAGNOSIS — R110 Nausea: Secondary | ICD-10-CM

## 2017-08-30 DIAGNOSIS — E1169 Type 2 diabetes mellitus with other specified complication: Secondary | ICD-10-CM | POA: Insufficient documentation

## 2017-09-06 ENCOUNTER — Other Ambulatory Visit: Payer: Self-pay

## 2017-09-06 MED ORDER — PANTOPRAZOLE SODIUM 40 MG PO TBEC: DELAYED_RELEASE_TABLET | ORAL | 3 refills | Status: DC

## 2017-09-19 ENCOUNTER — Other Ambulatory Visit: Payer: Self-pay | Admitting: Hematology and Oncology

## 2017-09-19 ENCOUNTER — Inpatient Hospital Stay: Payer: 59 | Admitting: Hematology and Oncology

## 2017-09-19 ENCOUNTER — Encounter: Payer: Self-pay | Admitting: Hematology and Oncology

## 2017-09-19 ENCOUNTER — Inpatient Hospital Stay: Payer: 59 | Attending: Hematology and Oncology

## 2017-09-19 DIAGNOSIS — D509 Iron deficiency anemia, unspecified: Secondary | ICD-10-CM

## 2017-09-30 ENCOUNTER — Telehealth: Payer: Self-pay | Admitting: Pulmonary Disease

## 2017-09-30 DIAGNOSIS — Z9989 Dependence on other enabling machines and devices: Principal | ICD-10-CM

## 2017-09-30 DIAGNOSIS — G4733 Obstructive sleep apnea (adult) (pediatric): Secondary | ICD-10-CM

## 2017-09-30 NOTE — Telephone Encounter (Signed)
Attempted to call pt. I did not receive an answer. I have left a message for pt to return our call.  

## 2017-09-30 NOTE — Telephone Encounter (Signed)
Spoke with Cheyenne Arias with [employer]. He states that the pt is going to need to be requalified for nighttime oxygen. Since the pt is currently on CPAP she will need to have a CPAP titration to qualify for nighttime oxygen.  Dr. Halford Chessman - please advise. Thanks!

## 2017-09-30 NOTE — Telephone Encounter (Signed)
If that is what the government likes to spend money on, then so be it.  Please let patient know that she needs CPAP titration in lab to continue using oxygen at night with CPAP.  If she is agreeable, then please place order.

## 2017-10-01 NOTE — Telephone Encounter (Signed)
Attempted to call pt but unable to reach pt and unable to leave a VM due to mailbox being full.  Will try to call back later.

## 2017-10-02 NOTE — Telephone Encounter (Signed)
atc pt X3, no answer, vm full.  Wcb.

## 2017-10-03 NOTE — Telephone Encounter (Signed)
Pt is aware of below message and voiced her understanding. Titration has been ordered. Nothing further is needed.

## 2017-10-07 DIAGNOSIS — E559 Vitamin D deficiency, unspecified: Secondary | ICD-10-CM | POA: Insufficient documentation

## 2017-10-08 DIAGNOSIS — E6609 Other obesity due to excess calories: Secondary | ICD-10-CM

## 2017-10-25 ENCOUNTER — Telehealth: Payer: Self-pay | Admitting: Pulmonary Disease

## 2017-10-25 NOTE — Telephone Encounter (Signed)
Called and spoke with patient regarding questions about O2 qualifying test. Advised pt that it was not her insurance asking her to requalify for O2, it is [employer]. Pt advised she will be calling [employer] today. Nothing further needed at this time.

## 2017-10-29 ENCOUNTER — Ambulatory Visit (HOSPITAL_BASED_OUTPATIENT_CLINIC_OR_DEPARTMENT_OTHER): Payer: 59 | Attending: Pulmonary Disease | Admitting: Pulmonary Disease

## 2017-10-29 DIAGNOSIS — Z9989 Dependence on other enabling machines and devices: Secondary | ICD-10-CM | POA: Insufficient documentation

## 2017-10-29 DIAGNOSIS — G4733 Obstructive sleep apnea (adult) (pediatric): Secondary | ICD-10-CM | POA: Diagnosis not present

## 2017-10-31 ENCOUNTER — Telehealth: Payer: Self-pay | Admitting: Pulmonary Disease

## 2017-10-31 DIAGNOSIS — G4733 Obstructive sleep apnea (adult) (pediatric): Secondary | ICD-10-CM

## 2017-10-31 NOTE — Telephone Encounter (Signed)
Attempted to call pt. I did not receive an answer. I have left a message for pt to return our call.  

## 2017-11-01 NOTE — Telephone Encounter (Signed)
Attempted to call Patient.  Left message on VM to call back.

## 2017-11-05 ENCOUNTER — Telehealth: Payer: Self-pay | Admitting: Pulmonary Disease

## 2017-11-05 DIAGNOSIS — G4733 Obstructive sleep apnea (adult) (pediatric): Secondary | ICD-10-CM | POA: Diagnosis not present

## 2017-11-05 DIAGNOSIS — Z9989 Dependence on other enabling machines and devices: Secondary | ICD-10-CM

## 2017-11-05 NOTE — Telephone Encounter (Signed)
CPAP titration 10/29/17 >> CPAP 7 cm H2O >> AHI 1.1.  Didn't need supplemental oxygen.  Please let her know she did well with CPAP during sleep study and didn't need supplemental oxygen.  Please have her CPAP changed to 7 cm H2O.

## 2017-11-05 NOTE — Procedures (Signed)
    Patient Name: Cheyenne Arias, Blaydes Date: 10/29/2017 Gender: Female D.O.B: [birth year] Age (years): 55 Referring Provider: Chesley Mires MD, ABSM Height (inches): 62 Interpreting Physician: Chesley Mires MD, ABSM Weight (lbs): 292 RPSGT: Laren Everts BMI: 53 MRN: [id number] Neck Size: 19.5  CLINICAL INFORMATION  The patient is referred for a CPAP titration to treat sleep apnea.  Date of NPSG, Split Night or HST: 03/27/11.  AHI 10.9.  SLEEP STUDY TECHNIQUE  As per the AASM Manual for the Scoring of Sleep and Associated Events v2.3 (April 2016) with a hypopnea requiring 4% desaturations. The channels recorded and monitored were frontal, central and occipital EEG, electrooculogram (EOG), submentalis EMG (chin), nasal and oral airflow, thoracic and abdominal wall motion, anterior tibialis EMG, snore microphone, electrocardiogram, and pulse oximetry. Continuous positive airway pressure (CPAP) was initiated at the beginning of the study and titrated to treat sleep-disordered breathing.  MEDICATIONS  Medications self-administered by patient taken the night of the study : [place], [place]  Comments added by technician: Patient was restless all through the night. Comments added by scorer: N/A  RESPIRATORY PARAMETERS  Optimal PAP Pressure (cm): 7 AHI at Optimal Pressure (/hr): 1.1 Overall Minimal O2 (%): 87.0 Supine % at Optimal Pressure (%): 38 Minimal O2 at Optimal Pressure (%): 87.0  SLEEP ARCHITECTURE  The study was initiated at 10:22:35 PM and ended at 5:02:58 AM. Sleep onset time was 0.3 minutes and the sleep efficiency was 61.2%%. The total sleep time was 245 minutes. The patient spent 21.4%% of the night in stage N1 sleep, 67.6%% in stage N2 sleep, 0.0%% in stage N3 and 11.02% in REM.Stage REM latency was 105.0 minutes Wake after sleep onset was 155.1. Alpha intrusion was absent. Supine sleep was 59.59%.  CARDIAC DATA  The 2 lead EKG  demonstrated sinus rhythm. The mean heart rate was 87.8 beats per minute. Other EKG findings include: None.  LEG MOVEMENT DATA  The total Periodic Limb Movements of Sleep (PLMS) were 0. The PLMS index was 0.0. A PLMS index of <15 is considered normal in adults.  IMPRESSIONS  - She did well with CPAP at 7 cm H2O.   - She did not require the use of supplemental oxygen during this study.  DIAGNOSIS  - Obstructive Sleep Apnea   RECOMMENDATIONS  - CPAP 7 cm H2O. - Based on study results she does not need to use supplemental oxygen at night. - She was fitted with a Medium Wide size Philips Respironics Full Face Mask Dreamwear mask and heated humidification.  [Electronically signed] 11/05/2017 11:47 AM  Chesley Mires MD, ABSM Diplomate, American Board of Sleep Medicine  NPI: [id number]

## 2017-11-05 NOTE — Telephone Encounter (Signed)
Left message for patient-had CPAP titration 10-29-17-no results given at this time from VS.

## 2017-11-06 NOTE — Telephone Encounter (Signed)
Chesley Mires, MD       11/05/17 11:51 AM  Note    CPAP titration 10/29/17 >> CPAP 7 cm H2O >> AHI 1.1.  Didn't need supplemental oxygen.  Please let her know she did well with CPAP during sleep study and didn't need supplemental oxygen.  Please have her CPAP changed to 7 cm H2O      LMTCB for patient.

## 2017-11-06 NOTE — Telephone Encounter (Signed)
Pt is calling about results. Cb is [phone]

## 2017-11-06 NOTE — Telephone Encounter (Signed)
Spoke with patient-she understands suggestion from VS and order for CPAP titration study has been placed. Nothing more needed at this time.

## 2017-11-06 NOTE — Telephone Encounter (Signed)
Pt returned call-states she does not agree with test results. States she had to sleep sitting propped up on 4 pillows and was awake several times.   Feels like she needs to keep her O2 and CPAP setting at 13; as she cant sleep without it. Feels like she will "die" if we take the O2 away and decrease her CPAP pressure.    Pt asked that the previous ONO completed showed she needed O2 while on CPAP. VS please advise. Thanks.

## 2017-11-06 NOTE — Telephone Encounter (Signed)
Based on this test result she wouldn't qualify for supplemental oxygen with CPAP.   Option would be to repeat CPAP titration with her sleeping flat in bed.

## 2017-11-08 NOTE — Telephone Encounter (Signed)
Attempted to call patient today regarding results. I did not receive an answer at time of call. I have left a voicemail message for pt to return call. X1  

## 2017-11-11 NOTE — Telephone Encounter (Signed)
Called and spoke with patient regarding results.  Informed the patient of results and recommendations today. Pt verbalized understanding and denied any questions or concerns at this time.  Placed order to [facility] to change cpap settings to 7cm H2O Nothing further needed.

## 2017-11-12 ENCOUNTER — Telehealth: Payer: Self-pay | Admitting: Pulmonary Disease

## 2017-11-12 DIAGNOSIS — G4733 Obstructive sleep apnea (adult) (pediatric): Secondary | ICD-10-CM

## 2017-11-12 NOTE — Telephone Encounter (Signed)
Placed a new order to [place] who is pt's correct DME company. Nothing further needed.

## 2017-11-20 ENCOUNTER — Telehealth: Payer: Self-pay | Admitting: Pulmonary Disease

## 2017-11-20 NOTE — Telephone Encounter (Signed)
LMTCB

## 2017-11-21 NOTE — Telephone Encounter (Signed)
Attempted to call patient today regarding her sleep machine and the sleep study that was done. I did not receive an answer at time of call. I have left a voicemail message for pt to return call. X2

## 2017-11-22 NOTE — Telephone Encounter (Signed)
Called and spoke with pt regarding her cpap machine Pt states she should be on O2 at night, and the pressure on cpap should be higher She would like to come in for OV with VS to further discuss Scheduled appt with VS Monday 11/25/17 at 1:45pm Nothing further needed at this time.

## 2017-11-25 ENCOUNTER — Encounter: Payer: Self-pay | Admitting: Pulmonary Disease

## 2017-11-25 ENCOUNTER — Ambulatory Visit (INDEPENDENT_AMBULATORY_CARE_PROVIDER_SITE_OTHER): Payer: 59 | Admitting: Pulmonary Disease

## 2017-11-25 VITALS — BP 132/72 | HR 98 | Ht 62.0 in | Wt 291.2 lb

## 2017-11-25 DIAGNOSIS — E662 Morbid (severe) obesity with alveolar hypoventilation: Secondary | ICD-10-CM

## 2017-11-25 DIAGNOSIS — J9611 Chronic respiratory failure with hypoxia: Secondary | ICD-10-CM | POA: Diagnosis not present

## 2017-11-25 DIAGNOSIS — Z6841 Body Mass Index (BMI) 40.0 and over, adult: Secondary | ICD-10-CM

## 2017-11-25 DIAGNOSIS — G4733 Obstructive sleep apnea (adult) (pediatric): Secondary | ICD-10-CM | POA: Diagnosis not present

## 2017-11-25 DIAGNOSIS — J45909 Unspecified asthma, uncomplicated: Secondary | ICD-10-CM

## 2017-11-25 DIAGNOSIS — R0602 Shortness of breath: Secondary | ICD-10-CM | POA: Diagnosis not present

## 2017-11-25 DIAGNOSIS — J4540 Moderate persistent asthma, uncomplicated: Secondary | ICD-10-CM

## 2017-11-25 NOTE — Progress Notes (Signed)
[facility]  Chief Complaint  Patient presents with  . Follow-up    follow up results.     Constitutional: BP 132/72 (BP Location: Left Arm, Cuff Size: Normal)   Pulse 98   Ht 5\' 2"  (1.575 m)   Wt 291 lb 3.2 oz (132.1 kg)   SpO2 96%   BMI 53.26 kg/m   History of Present Illness: Cheyenne Arias is a 42 y.o. female with asthma, OSA, OHS, and chronic respiratory failure.  She is having more trouble with her breathing.  Can occur at rest and with exertion.  Having more cough, wheeze, and sputum.  Also has sinus congestion.  Not having fever, abdominal pain, skin rash, or hemoptysis.  She had in lab titration.  She doesn't think this was accurate.  She didn't take any of her usual meds at night (including pain meds), and she slept on a bunch of pillows.  She thinks CPAP 7 is too low.  Comprehensive Respiratory Exam:  Appearance - well kempt  ENMT - nasal mucosa moist, turbinates clear, midline nasal septum, no dental lesions, no gingival bleeding, no oral exudates, no tonsillar hypertrophy Neck - no masses, trachea midline, no thyromegaly, no elevation in JVP Respiratory - normal appearance of chest wall, normal respiratory effort w/o accessory muscle use, no dullness on percussion, no wheezing or rales CV - s1s2 regular rate and rhythm, no murmurs, ankle edema, radial pulses symmetric GI - soft, non tender, no masses Lymph - no adenopathy noted in neck and axillary areas MSK - normal muscle strength and tone, normal gait Ext - no cyanosis, clubbing, or joint inflammation noted Skin - no rashes, lesions, or ulcers Neuro - oriented to person, place, and time Psych - normal mood and affect  Assessment/Plan:  Allergic asthma with eosinophilic phenotype. - continue singulair, pulmicort, and duoneb - check RAST with IgE, CBC with diff, CXR, and PFT  Obstructive sleep apnea. - she feels like CPAP needs to be increased - will change to  CPAP 13 cm H2O  Chronic hypoxic respiratory failure 2nd to obesity hypoventilation syndrome. - she doesn't think recent titration study was accurate due to different sleep position and not being on her usual medications - will arrange for repeat CPAP titration study and determine if she qualifies for oxygen at night - explained this might be done prior to her follow up  Morbid obesity. - discussed importance of weight loss   Patient Instructions  Lab tests and chest xray today Will schedule pulmonary function test and CPAP titration study  Follow up in 2 to 3 weeks with Dr. Halford Chessman or Nurse Practitioner    Chesley Mires, MD [place] 11/25/2017, 2:15 PM  Flow Sheet  Cardiac tests Echo 09/20/11 > >mod LVH, EF 65 to 70%, small/mod pericardial effusion  Doppler legs 09/21/11 >> no DVT  Pulmonary tests CT chest 09/22/11 >> no PE, Lt base ATX/ASD, borderline cardiomegaly Spirometry 01/10/12 >> FEV1 1.72 (71%), FEV1% 77 CT sinus 02/28/12 >> clear paranasal sinuses RAST 02/28/12 >> IgE 15.6, moderate reaction to [facility] CT chest 04/03/12 >> ASD superior segment LLL, RLL ATX PFT 12/17/12 >> FEV1 2.14 (95%), FEV1% 88, TLC 2.62 (58%), DLCO 96%, no BD  Sleep tests: PSG 03/27/11 >> AHI 10.9 CPAP titration 10/29/17 >> CPAP 7 cm H2O >> AHI 1.1.  Didn't need supplemental oxygen  Past Medical History: She  has a past medical history of Allergy, Anemia, Anxiety, Arthritis, Asthma, Bipolar disorder ([place]), Cancer (  [place]) (2013), Depression, Fatty liver disease, nonalcoholic, Fibromyalgia, Food poisoning, GERD (gastroesophageal reflux disease), Hepatitis, HSV infection, Hypertension, Lumbar radiculopathy, Migraines, Neuropathy, Obesity, OSA (obstructive sleep apnea), OSA on CPAP, Peripheral neuropathy, Shortness of breath dyspnea, and Spinal stenosis.  Past Surgical History: She  has a past surgical history that includes Wisdom tooth extraction; Mandible surgery (2008); Carpal tunnel  release (Right); Cesarean section (1994, 1996, 2013); Cesarean section (11/28/2011); Colposcopy; Hysteroscopy w/D&C (N/A, 08/17/2015); Facet joint injection; Dilatation & currettage/hysteroscopy with novasure ablation (N/A, 02/13/2017); and Colonoscopy (N/A, 04/01/2017).  Family History: Her family history includes Depression in her daughter; Hypertension in her mother; Kidney disease in her father; Schizophrenia in her son; Sickle cell anemia in her other; Sickle cell trait in her maternal aunt.  Social History: She  reports that she has never smoked. She has never used smokeless tobacco. She reports that she does not drink alcohol or use drugs.  Medications: Allergies as of 11/25/2017      Reactions   Cymbalta [duloxetine Hcl] Other (See Comments)   Head felt cold and tight, loss of conciousness   Latex Itching, Swelling, Other (See Comments)   Reaction:  Blisters    Morphine And Related Itching      Medication List        Accurate as of 11/25/17  2:15 PM. Always use your most recent med list.          albuterol 108 (90 Base) MCG/ACT inhaler Commonly known as:  PROVENTIL HFA;VENTOLIN HFA Inhale 2 puffs into the lungs every 6 (six) hours as needed for wheezing or shortness of breath.   amLODipine 10 MG tablet Commonly known as:  NORVASC Take 10 mg by mouth daily.   ARIPiprazole 2 MG tablet Commonly known as:  ABILIFY Take 1 tablet (2 mg total) by mouth at bedtime.   budesonide 0.5 MG/2ML nebulizer solution Commonly known as:  PULMICORT TAKE 2 MLS BY NEBULIZATION 2 TIMES DAILY. DX CODE: CHRONIC ASTHMA J45.909   clonazePAM 0.5 MG tablet Commonly known as:  KLONOPIN TAKE 1 TABLET TWICE A DAY AS NEEDED FOR ANXIETY   cyclobenzaprine 10 MG tablet Commonly known as:  FLEXERIL Take 10 mg by mouth 3 (three) times daily.   FERAHEME 510 MG/17ML Soln injection Generic drug:  ferumoxytol Inject 510 mg into the vein every 3 (three) months.   FLUoxetine 20 MG capsule Commonly  known as:  PROZAC Take 1 capsule (20 mg total) by mouth daily.   fluticasone 50 MCG/ACT nasal spray Commonly known as:  FLONASE PLACE 2 SPRAYS INTO BOTH NOSTRILS 2 (TWO) TIMES DAILY.   furosemide 80 MG tablet Commonly known as:  LASIX Take 80 mg by mouth 2 (two) times daily.   ibuprofen 800 MG tablet Commonly known as:  ADVIL,MOTRIN Take 800 mg by mouth 3 (three) times daily as needed for moderate pain.   ipratropium-albuterol 0.5-2.5 (3) MG/3ML Soln Commonly known as:  DUONEB TAKE 3 MLS BY NEBULIZATION EVERY 4 HOURS AS NEEDED (FOR WHEEZING/SHORTNESS OF BREATH).   loratadine 10 MG tablet Commonly known as:  CLARITIN TAKE 10 MG BY MOUTH at night   metoprolol tartrate 25 MG tablet Commonly known as:  LOPRESSOR Take 25 mg by mouth 2 (two) times daily.   montelukast 10 MG tablet Commonly known as:  SINGULAIR TAKE 10 MG BY MOUTH AT BEDTIME   multivitamin with minerals tablet Take 1 tablet by mouth daily.   ondansetron 4 MG tablet Commonly known as:  ZOFRAN Take 1 tablet (4 mg total) by  mouth every 8 (eight) hours as needed for nausea or vomiting.   oxyCODONE 15 MG immediate release tablet Commonly known as:  ROXICODONE Take 15 mg by mouth every 6 (six) hours as needed for pain.   OXYGEN Inhale 2 L into the lungs at bedtime.   pantoprazole 40 MG tablet Commonly known as:  PROTONIX Take 40 mg by mouth at night   potassium chloride SA 20 MEQ tablet Commonly known as:  K-DUR,KLOR-CON Take 1 tablet (20 mEq total) by mouth daily.   pregabalin 300 MG capsule Commonly known as:  LYRICA Take 300 mg by mouth 2 (two) times daily.   rizatriptan 10 MG tablet Commonly known as:  MAXALT Take 10 mg by mouth as needed for migraine. May repeat in 2 hours if needed   topiramate 100 MG tablet Commonly known as:  TOPAMAX Take 100 mg by mouth 2 (two) times daily.   triamcinolone cream 0.1 % Commonly known as:  KENALOG Apply 1 application topically daily as needed (for rash).

## 2017-11-25 NOTE — Patient Instructions (Signed)
Lab tests and chest xray today Will schedule pulmonary function test and CPAP titration study  Follow up in 2 to 3 weeks with Dr. Halford Chessman or Nurse Practitioner

## 2017-11-26 DIAGNOSIS — M47812 Spondylosis without myelopathy or radiculopathy, cervical region: Secondary | ICD-10-CM | POA: Insufficient documentation

## 2017-11-26 DIAGNOSIS — F514 Sleep terrors [night terrors]: Secondary | ICD-10-CM | POA: Insufficient documentation

## 2017-11-27 ENCOUNTER — Other Ambulatory Visit (INDEPENDENT_AMBULATORY_CARE_PROVIDER_SITE_OTHER): Payer: 59

## 2017-11-27 ENCOUNTER — Telehealth: Payer: Self-pay | Admitting: Pulmonary Disease

## 2017-11-27 ENCOUNTER — Ambulatory Visit (INDEPENDENT_AMBULATORY_CARE_PROVIDER_SITE_OTHER)
Admission: RE | Admit: 2017-11-27 | Discharge: 2017-11-27 | Disposition: A | Discharge status: Home / Self Care | Payer: 59 | Source: Ambulatory Visit | Attending: Pulmonary Disease | Admitting: Pulmonary Disease

## 2017-11-27 DIAGNOSIS — R0602 Shortness of breath: Secondary | ICD-10-CM

## 2017-11-27 LAB — CBC WITH DIFFERENTIAL/PLATELET
Basophils Absolute: 0.1 10*3/uL (ref 0.0–0.1)
Basophils Relative: 0.5 % (ref 0.0–3.0)
EOS ABS: 0.2 10*3/uL (ref 0.0–0.7)
EOS PCT: 2 % (ref 0.0–5.0)
HCT: 38.7 % (ref 36.0–46.0)
HEMOGLOBIN: 12.5 g/dL (ref 12.0–15.0)
Lymphocytes Relative: 17.7 % (ref 12.0–46.0)
Lymphs Abs: 1.9 10*3/uL (ref 0.7–4.0)
MCHC: 32.3 g/dL (ref 30.0–36.0)
MCV: 81.9 fl (ref 78.0–100.0)
MONO ABS: 0.6 10*3/uL (ref 0.1–1.0)
Monocytes Relative: 5.3 % (ref 3.0–12.0)
Neutro Abs: 8 10*3/uL — ABNORMAL HIGH (ref 1.4–7.7)
Neutrophils Relative %: 74.5 % (ref 43.0–77.0)
Platelets: 345 10*3/uL (ref 150.0–400.0)
RBC: 4.73 Mil/uL (ref 3.87–5.11)
RDW: 17.1 % — ABNORMAL HIGH (ref 11.5–15.5)
WBC: 10.8 10*3/uL — ABNORMAL HIGH (ref 4.0–10.5)

## 2017-11-27 NOTE — Telephone Encounter (Signed)
CBC    Component Value Date/Time   WBC 10.8 (H) 11/27/2017 0907   RBC 4.73 11/27/2017 0907   HGB 12.5 11/27/2017 0907   HGB 11.5 (L) 05/21/2017 1334   HGB 8.5 (L) 12/11/2016 1213   HCT 38.7 11/27/2017 0907   HCT 30.0 (L) 12/11/2016 1213   PLT 345.0 11/27/2017 0907   PLT 436 (H) 05/21/2017 1334   PLT 392 12/11/2016 1213   MCV 81.9 11/27/2017 0907   MCV 62.2 (L) 12/11/2016 1213   MCH 21.5 (L) 05/21/2017 1334   MCHC 32.3 11/27/2017 0907   RDW 17.1 (H) 11/27/2017 0907   RDW 18.3 (H) 12/11/2016 1213   LYMPHSABS 1.9 11/27/2017 0907   LYMPHSABS 2.5 12/11/2016 1213   MONOABS 0.6 11/27/2017 0907   MONOABS 0.5 12/11/2016 1213   EOSABS 0.2 11/27/2017 0907   EOSABS 0.2 12/11/2016 1213   BASOSABS 0.1 11/27/2017 0907   BASOSABS 0.0 12/11/2016 1213     Please let her know lab test was normal.

## 2017-11-28 NOTE — Telephone Encounter (Signed)
Attempted to call patient today regarding results. I did not receive an answer at time of call. I have left a voicemail message for pt to return call. X1  

## 2017-12-02 NOTE — Telephone Encounter (Signed)
Dg Chest 2 View  Result Date: 11/27/2017 CLINICAL DATA:  Cough and shortness of breath EXAM: CHEST - 2 VIEW COMPARISON:  07/15/2017 FINDINGS: The heart size and mediastinal contours are within normal limits. Both lungs are clear. The visualized skeletal structures are unremarkable. IMPRESSION: No active cardiopulmonary disease. Electronically Signed   By: Misty Stanley M.D.   On: 11/27/2017 14:07    Please let her know CXR was normal.

## 2017-12-02 NOTE — Telephone Encounter (Signed)
Called spoke with patient, advised of lab results as stated by VS Pt voiced her understanding  Patient is also requesting results from 11/27/17 cxr Please advise, thank you.

## 2017-12-02 NOTE — Telephone Encounter (Signed)
Patient returned phone call; pt contact # [phone]

## 2017-12-02 NOTE — Telephone Encounter (Signed)
Spoke with pt, aware of results.  Nothing further needed.  

## 2017-12-05 ENCOUNTER — Encounter (HOSPITAL_BASED_OUTPATIENT_CLINIC_OR_DEPARTMENT_OTHER): Payer: Self-pay

## 2017-12-12 ENCOUNTER — Ambulatory Visit (HOSPITAL_BASED_OUTPATIENT_CLINIC_OR_DEPARTMENT_OTHER): Payer: 59 | Attending: Pulmonary Disease | Admitting: Pulmonary Disease

## 2017-12-12 VITALS — Ht 62.0 in | Wt 292.0 lb

## 2017-12-12 DIAGNOSIS — G4733 Obstructive sleep apnea (adult) (pediatric): Secondary | ICD-10-CM | POA: Insufficient documentation

## 2017-12-12 DIAGNOSIS — E662 Morbid (severe) obesity with alveolar hypoventilation: Secondary | ICD-10-CM

## 2017-12-18 ENCOUNTER — Telehealth: Payer: Self-pay | Admitting: Pulmonary Disease

## 2017-12-18 ENCOUNTER — Ambulatory Visit: Payer: Self-pay | Admitting: Pulmonary Disease

## 2017-12-18 DIAGNOSIS — G4733 Obstructive sleep apnea (adult) (pediatric): Secondary | ICD-10-CM | POA: Diagnosis not present

## 2017-12-18 NOTE — Telephone Encounter (Signed)
Phoned patient daughter, Patient has been rescheduled for 12/23/2017 at 3:00pm with 6MWT with NP B Mack. PFT will need to be rescheduled at next OV. Nothing further needed at this time.

## 2017-12-18 NOTE — Telephone Encounter (Signed)
CPAP titration 12/12/17 >> CPAP 14 cm H2O, didn't need O2.  Slept in supine position.   Please let her know that her sleep study showed good control of sleep apnea with CPAP even when she is sleeping on her back.  She didn't need supplemental oxygen at night based on this time.   Please send order to change her CPAP to 14 cm H2O.

## 2017-12-18 NOTE — Procedures (Signed)
    Patient Name: Cheyenne, Arias Date: 12/12/2017   Gender: Female  D.O.B: [birth year]  Age (years): 4  Referring Provider: Chesley Mires MD, ABSM  Height (inches): 62  Interpreting Physician: Chesley Mires MD, ABSM  Weight (lbs): 292  RPSGT: [name], [name]  BMI: 53  MRN: [id number]  Neck Size: 19.50  <br> <br>  CLINICAL INFORMATION  The patient is referred for a CPAP titration to treat sleep apnea. SLEEP STUDY TECHNIQUE  As per the AASM Manual for the Scoring of Sleep and Associated Events v2.3 (April 2016) with a hypopnea requiring 4% desaturations. The channels recorded and monitored were frontal, central and occipital EEG, electrooculogram (EOG), submentalis EMG (chin), nasal and oral airflow, thoracic and abdominal wall motion, anterior tibialis EMG, snore microphone, electrocardiogram, and pulse oximetry. Continuous positive airway pressure (CPAP) was initiated at the beginning of the study and titrated to treat sleep-disordered breathing. MEDICATIONS  Medications self-administered by patient taken the night of the study : [name], [name]  Comments added by technician: The patient slept more flat and on her back this time as instructed by referring MD. The patient took her usual mediation for bedtime. Comments added by scorer: N/A  RESPIRATORY PARAMETERS  Optimal PAP Pressure (cm): 14 AHI at Optimal Pressure (/hr): 2.2  Overall Minimal O2 (%): 93.0 Supine % at Optimal Pressure (%): 99  Minimal O2 at Optimal Pressure (%): 93.0      SLEEP ARCHITECTURE  The study was initiated at 11:00:04 PM and ended at 5:05:52 AM. Sleep onset time was 2.8 minutes and the sleep efficiency was 93.5%%. The total sleep time was 342 minutes. The patient spent 6.3%% of the night in stage N1 sleep, 89.3%% in stage N2 sleep, 0.0%% in stage N3 and 4.4% in REM.Stage REM latency was 111.0 minutes Wake after sleep onset was 21.0. Alpha intrusion was absent. Supine sleep  was 99.51%. CARDIAC DATA  The 2 lead EKG demonstrated sinus rhythm. The mean heart rate was 90.3 beats per minute. Other EKG findings include: None.  LEG MOVEMENT DATA  The total Periodic Limb Movements of Sleep (PLMS) were 0. The PLMS index was 0.0. A PLMS index of <15 is considered normal in adults. IMPRESSIONS  - The optimal PAP pressure was 14 cm of water. She did not require supplemental oxygen during this study. She slept in the supine position for the majority of the study. DIAGNOSIS  - Obstructive Sleep Apnea (327.23 [G47.33 ICD-10]) RECOMMENDATIONS  - Trial of CPAP therapy on 14 cm H2O with a Small size [name] mask and heated humidification. [Electronically signed] 12/18/2017 12:13 PM Chesley Mires MD, [place], American Board of Sleep Medicine  NPI: [id number]

## 2017-12-18 NOTE — Telephone Encounter (Signed)
Gilda from [facility] called - pt would need to come in a do another qualifying 6 min walk to get new POC. Bethanne Ginger can be reached at [phone]

## 2017-12-18 NOTE — Telephone Encounter (Signed)
Spoke with patient's daughter [name]. She stated that the patient is only using the O2 at night. Her concentrator has stopped working.   Spoke with Estill Bamberg at [facility]. She stated that the patient will be to qualified for O2 again with an OV and 6 minute walk. Explained to [place] that the O2 is just for nighttime use. She stated that the patient has switched insurance companies and the insurance is requiring all of this. She stated that the patient had a cpap titration a few months ago but she will not qualify for O2.   Dr. Halford Chessman, please advise. Thanks!

## 2017-12-18 NOTE — Telephone Encounter (Signed)
She was not able to make ROV today.  Please reschedule.    Please also schedule 6 MWT.

## 2017-12-18 NOTE — Telephone Encounter (Signed)
Attempted to call patient today regarding results. I did not receive an answer at time of call. I have left a voicemail message for pt to return call. X1  

## 2017-12-19 NOTE — Telephone Encounter (Signed)
Attempted to call patient today regarding results. I did not receive an answer at time of call. I have left a voicemail message for pt to return call. X2  

## 2017-12-20 NOTE — Telephone Encounter (Signed)
Attempted to call patient today regarding results. I have left a voicemail message for pt to return call. X3 We have left three messages for pt to call our office back regarding results. No response at this time, a letter has been placed in mail today for pt to call our office. Mailed letter of communication today. Nothing further needed at this time.    

## 2017-12-23 ENCOUNTER — Ambulatory Visit: Payer: Self-pay | Admitting: Pulmonary Disease

## 2017-12-23 ENCOUNTER — Ambulatory Visit: Payer: Self-pay

## 2017-12-23 NOTE — Progress Notes (Deleted)
@Patient  ID: Cheyenne Arias, female    DOB: 1976-04-03, 42 y.o.   MRN: [id number]  No chief complaint on file.   Referring provider: Josephina Gip, NP  HPI:   PMH:  Smoker/ Smoking History:  Maintenance:   Pt of: Dr. Halford Chessman   Recent [facility] Encounters:       12/23/2017  - Visit   HPI     Tests:   Echo 09/20/11 > >mod LVH, EF 65 to 70%, small/mod pericardial effusion  Doppler legs 09/21/11 >> no DVT  Pulmonary tests CT chest 09/22/11 >> no PE, Lt base ATX/ASD, borderline cardiomegaly Spirometry 01/10/12 >> FEV1 1.72 (71%), FEV1% 77 CT sinus 02/28/12 >> clear paranasal sinuses RAST 02/28/12 >> IgE 15.6, moderate reaction to [facility] CT chest 04/03/12 >> ASD superior segment LLL, RLL ATX PFT 12/17/12 >> FEV1 2.14 (95%), FEV1% 88, TLC 2.62 (58%), DLCO 96%, no BD  Sleep tests: PSG 03/27/11 >> AHI 10.9 CPAP titration 10/29/17 >> CPAP 7 cm H2O >> AHI 1.1.  Didn't need supplemental oxygen      Chart Review:     Specialty Problems      Pulmonary Problems   Asthma, chronic   OSA (obstructive sleep apnea)   Sepsis due to pneumonia ([place])   CAP (community acquired pneumonia)      Allergies  Allergen Reactions  . Cymbalta [Duloxetine Hcl] Other (See Comments)    Head felt cold and tight, loss of conciousness  . Latex Itching, Swelling and Other (See Comments)    Reaction:  Blisters   . Morphine And Related Itching    Immunization History  Administered Date(s) Administered  . Influenza Split 11/25/2014  . Influenza,inj,Quad PF,6+ Mos 02/02/2013, 01/18/2017  . Influenza-Unspecified 03/23/2014    Past Medical History:  Diagnosis Date  . Allergy    seasonal  . Anemia    goes to [facility], ferahem  . Anxiety   . Arthritis    spinal stenosis  . Asthma   . Bipolar disorder ([place])   . Cancer [facility]) 2013   Cervical cancer  . Depression   . Fatty liver disease, nonalcoholic   . Fibromyalgia   . Food poisoning   . GERD (gastroesophageal reflux  disease)   . 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 HSV infection   . Hypertension   . Lumbar radiculopathy   . Migraines   . Neuropathy   . Obesity   . OSA (obstructive sleep apnea)   . OSA on CPAP    uses it nightly  . Peripheral neuropathy    LLE - pins and needles  . Shortness of breath dyspnea    with exertion  . Spinal stenosis     Tobacco History: Social History   Tobacco Use  Smoking Status Never Smoker  Smokeless Tobacco Never Used  Tobacco Comment   Pt was exposed to 2nd hand smoke at work years ago.   Counseling given: Not Answered Comment: Pt was exposed to 2nd hand smoke at work years ago.   Outpatient Encounter Medications as of 12/23/2017  Medication Sig  . albuterol (PROAIR HFA) 108 (90 Base) MCG/ACT inhaler Inhale 2 puffs into the lungs every 6 (six) hours as needed for wheezing or shortness of breath.  [name] amLODipine (NORVASC) 10 MG tablet Take 10 mg by mouth daily.  . ARIPiprazole (ABILIFY) 2 MG tablet Take 1 tablet (2 mg total) by mouth at bedtime.  . budesonide (PULMICORT) 0.5 MG/2ML nebulizer solution TAKE  2 MLS BY NEBULIZATION 2 TIMES DAILY. DX CODE: CHRONIC ASTHMA J45.909  . clonazePAM (KLONOPIN) 0.5 MG tablet TAKE 1 TABLET TWICE A DAY AS NEEDED FOR ANXIETY  . cyclobenzaprine (FLEXERIL) 10 MG tablet Take 10 mg by mouth 3 (three) times daily.   . ferumoxytol (FERAHEME) 510 MG/17ML SOLN injection Inject 510 mg into the vein every 3 (three) months.   [name] FLUoxetine (PROZAC) 20 MG capsule Take 1 capsule (20 mg total) by mouth daily.  . fluticasone (FLONASE) 50 MCG/ACT nasal spray PLACE 2 SPRAYS INTO BOTH NOSTRILS 2 (TWO) TIMES DAILY.  . furosemide (LASIX) 80 MG tablet Take 80 mg by mouth 2 (two) times daily.  [name] ibuprofen (ADVIL,MOTRIN) 800 MG tablet Take 800 mg by mouth 3 (three) times daily as needed for moderate pain.  [name] ipratropium-albuterol (DUONEB) 0.5-2.5 (3) MG/3ML SOLN TAKE 3 MLS BY NEBULIZATION EVERY 4 HOURS AS NEEDED (FOR WHEEZING/SHORTNESS OF  BREATH).  [name] loratadine (CLARITIN) 10 MG tablet TAKE 10 MG BY MOUTH at night  . metoprolol tartrate (LOPRESSOR) 25 MG tablet Take 25 mg by mouth 2 (two) times daily.   . montelukast (SINGULAIR) 10 MG tablet TAKE 10 MG BY MOUTH AT BEDTIME  . Multiple Vitamins-Minerals (MULTIVITAMIN WITH MINERALS) tablet Take 1 tablet by mouth daily.  . ondansetron (ZOFRAN) 4 MG tablet Take 1 tablet (4 mg total) by mouth every 8 (eight) hours as needed for nausea or vomiting.  [name] oxyCODONE (ROXICODONE) 15 MG immediate release tablet Take 15 mg by mouth every 6 (six) hours as needed for pain.  . OXYGEN Inhale 2 L into the lungs at bedtime.  . pantoprazole (PROTONIX) 40 MG tablet Take 40 mg by mouth at night  . potassium chloride SA (K-DUR,KLOR-CON) 20 MEQ tablet Take 1 tablet (20 mEq total) by mouth daily. (Patient taking differently: Take 20 mEq by mouth 2 (two) times daily. )  . pregabalin (LYRICA) 300 MG capsule Take 300 mg by mouth 2 (two) times daily.   . rizatriptan (MAXALT) 10 MG tablet Take 10 mg by mouth as needed for migraine. May repeat in 2 hours if needed  . topiramate (TOPAMAX) 100 MG tablet Take 100 mg by mouth 2 (two) times daily.   [name] triamcinolone cream (KENALOG) 0.1 % Apply 1 application topically daily as needed (for rash).   . [DISCONTINUED] zolpidem (AMBIEN) 5 MG tablet Take 5 mg by mouth at bedtime as needed for sleep.    No facility-administered encounter medications on file as of 12/23/2017.      Review of Systems  Review of Systems   Physical Exam  There were no vitals taken for this visit.  Wt Readings from Last 5 Encounters:  12/12/17 292 lb (132.5 kg)  11/25/17 291 lb 3.2 oz (132.1 kg)  05/21/17 294 lb 3.2 oz (133.4 kg)  04/01/17 280 lb (127 kg)  03/10/17 280 lb (127 kg)     Physical Exam    Lab Results:  CBC    Component Value Date/Time   WBC 10.8 (H) 11/27/2017 0907   RBC 4.73 11/27/2017 0907   HGB 12.5 11/27/2017 0907   HGB 11.5 (L) 05/21/2017 1334   HGB 8.5  (L) 12/11/2016 1213   HCT 38.7 11/27/2017 0907   HCT 30.0 (L) 12/11/2016 1213   PLT 345.0 11/27/2017 0907   PLT 436 (H) 05/21/2017 1334   PLT 392 12/11/2016 1213   MCV 81.9 11/27/2017 0907   MCV 62.2 (L) 12/11/2016 1213   MCH 21.5 (L) 05/21/2017 1334  MCHC 32.3 11/27/2017 0907   RDW 17.1 (H) 11/27/2017 0907   RDW 18.3 (H) 12/11/2016 1213   LYMPHSABS 1.9 11/27/2017 0907   LYMPHSABS 2.5 12/11/2016 1213   MONOABS 0.6 11/27/2017 0907   MONOABS 0.5 12/11/2016 1213   EOSABS 0.2 11/27/2017 0907   EOSABS 0.2 12/11/2016 1213   BASOSABS 0.1 11/27/2017 0907   BASOSABS 0.0 12/11/2016 1213    BMET    Component Value Date/Time   NA 137 03/10/2017 1735   NA 139 08/29/2012 1102   K 3.0 (L) 03/10/2017 1735   K 3.2 (L) 08/29/2012 1102   CL 107 03/10/2017 1735   CL 105 08/29/2012 1102   CO2 23 03/10/2017 1735   CO2 23 08/29/2012 1102   GLUCOSE 107 (H) 03/10/2017 1735   GLUCOSE 130 (H) 08/29/2012 1102   BUN 11 03/10/2017 1735   BUN 5.8 (L) 08/29/2012 1102   CREATININE 0.57 03/10/2017 1735   CREATININE 0.8 08/29/2012 1102   CALCIUM 8.6 (L) 03/10/2017 1735   CALCIUM 8.6 08/29/2012 1102   GFRNONAA >60 03/10/2017 1735   GFRAA >60 03/10/2017 1735    BNP No results found for: BNP  ProBNP    Component Value Date/Time   PROBNP 6.1 01/06/2014 0800    Imaging: Dg Chest 2 View  Result Date: 11/27/2017 CLINICAL DATA:  Cough and shortness of breath EXAM: CHEST - 2 VIEW COMPARISON:  07/15/2017 FINDINGS: The heart size and mediastinal contours are within normal limits. Both lungs are clear. The visualized skeletal structures are unremarkable. IMPRESSION: No active cardiopulmonary disease. Electronically Signed   By: Misty Stanley M.D.   On: 11/27/2017 14:07      Assessment & Plan:     No problem-specific Assessment & Plan notes found for this encounter.     Lauraine Rinne, NP 12/23/2017

## 2018-01-03 ENCOUNTER — Ambulatory Visit: Payer: Self-pay | Admitting: Pulmonary Disease

## 2018-01-08 ENCOUNTER — Other Ambulatory Visit: Payer: Self-pay

## 2018-01-08 DIAGNOSIS — N390 Urinary tract infection, site not specified: Secondary | ICD-10-CM

## 2018-01-08 DIAGNOSIS — D259 Leiomyoma of uterus, unspecified: Secondary | ICD-10-CM

## 2018-01-08 DIAGNOSIS — R110 Nausea: Secondary | ICD-10-CM

## 2018-01-08 MED ORDER — MONTELUKAST SODIUM 10 MG PO TABS: ORAL_TABLET | ORAL | 5 refills | Status: AC

## 2018-01-17 ENCOUNTER — Other Ambulatory Visit: Payer: Self-pay

## 2018-01-17 ENCOUNTER — Encounter (HOSPITAL_BASED_OUTPATIENT_CLINIC_OR_DEPARTMENT_OTHER): Payer: Self-pay | Admitting: Emergency Medicine

## 2018-01-17 ENCOUNTER — Emergency Department (HOSPITAL_BASED_OUTPATIENT_CLINIC_OR_DEPARTMENT_OTHER)
Admission: EM | Admit: 2018-01-17 | Discharge: 2018-01-17 | Discharge status: Against Medical Advice | Payer: 59 | Attending: Emergency Medicine | Admitting: Emergency Medicine

## 2018-01-17 DIAGNOSIS — Z5321 Procedure and treatment not carried out due to patient leaving prior to being seen by health care provider: Secondary | ICD-10-CM | POA: Insufficient documentation

## 2018-01-17 DIAGNOSIS — R21 Rash and other nonspecific skin eruption: Secondary | ICD-10-CM | POA: Insufficient documentation

## 2018-01-17 NOTE — ED Triage Notes (Signed)
Pt c/o of generalized rash on her arm, back and legs that woke her up from sleep. Pt states she needs prednisone for this she has prior hx of skin allergies.

## 2018-01-17 NOTE — ED Notes (Signed)
Patient asking if she will be seen by the doctor within an hour; advised patient that the department is full and unsure of exact wait time; patient states she will go home and would like her mother who has also checked in to be seen go in her place. nad noted.

## 2018-02-26 ENCOUNTER — Telehealth: Payer: Self-pay | Admitting: Pulmonary Disease

## 2018-02-26 MED ORDER — ALBUTEROL SULFATE HFA 108 (90 BASE) MCG/ACT IN AERS: 2.0000 | INHALATION_SPRAY | Freq: Four times a day (QID) | RESPIRATORY_TRACT | 0 refills | Status: DC | PRN

## 2018-02-26 NOTE — Telephone Encounter (Signed)
Called patient unable to reach left message to give [place] a call back. Refill sent in.

## 2018-02-27 NOTE — Telephone Encounter (Signed)
Attempted to call pt to see if she was able to pick up the refill of the proair inhaler but unable to reach pt and unable to leave a VM due to mailbox being full.  Will try to call back later.

## 2018-02-28 ENCOUNTER — Ambulatory Visit: Payer: Self-pay

## 2018-02-28 ENCOUNTER — Ambulatory Visit: Payer: Self-pay | Admitting: Pulmonary Disease

## 2018-02-28 NOTE — Telephone Encounter (Signed)
Pt was scheduled for an appt today, 11/15 with Wyn Quaker, NP but pt did not show up for that appt and appt was rescheduled for 03/10/18 with Wyn Quaker, NP  Attempted to call pt but unable to reach her and unable to leave a VM due to mailbox being full. Will try to call back later.

## 2018-03-02 ENCOUNTER — Other Ambulatory Visit: Payer: Self-pay

## 2018-03-02 ENCOUNTER — Emergency Department (HOSPITAL_BASED_OUTPATIENT_CLINIC_OR_DEPARTMENT_OTHER): Payer: 59

## 2018-03-02 ENCOUNTER — Emergency Department (HOSPITAL_BASED_OUTPATIENT_CLINIC_OR_DEPARTMENT_OTHER)
Admission: EM | Admit: 2018-03-02 | Discharge: 2018-03-03 | Disposition: A | Discharge status: Home / Self Care | Payer: 59 | Attending: Emergency Medicine | Admitting: Emergency Medicine

## 2018-03-02 ENCOUNTER — Encounter (HOSPITAL_BASED_OUTPATIENT_CLINIC_OR_DEPARTMENT_OTHER): Payer: Self-pay | Admitting: *Deleted

## 2018-03-02 DIAGNOSIS — Z79899 Other long term (current) drug therapy: Secondary | ICD-10-CM | POA: Insufficient documentation

## 2018-03-02 DIAGNOSIS — Z91040 Latex allergy status: Secondary | ICD-10-CM | POA: Insufficient documentation

## 2018-03-02 DIAGNOSIS — R51 Headache: Secondary | ICD-10-CM | POA: Diagnosis present

## 2018-03-02 DIAGNOSIS — G43911 Migraine, unspecified, intractable, with status migrainosus: Secondary | ICD-10-CM

## 2018-03-02 DIAGNOSIS — J45909 Unspecified asthma, uncomplicated: Secondary | ICD-10-CM | POA: Diagnosis not present

## 2018-03-02 DIAGNOSIS — I10 Essential (primary) hypertension: Secondary | ICD-10-CM | POA: Diagnosis not present

## 2018-03-02 MED ORDER — PROCHLORPERAZINE EDISYLATE 10 MG/2ML IJ SOLN
10.0000 mg | Freq: Once | INTRAMUSCULAR | Status: AC
  Administered 2018-03-02: 10 mg via INTRAVENOUS
  Filled 2018-03-02: qty 2

## 2018-03-02 MED ORDER — MAGNESIUM SULFATE 2 GM/50ML IV SOLN
2.0000 g | Freq: Once | INTRAVENOUS | Status: AC
  Administered 2018-03-02: 2 g via INTRAVENOUS
  Filled 2018-03-02: qty 50

## 2018-03-02 MED ORDER — DIPHENHYDRAMINE HCL 50 MG/ML IJ SOLN
25.0000 mg | Freq: Once | INTRAMUSCULAR | Status: AC
  Administered 2018-03-02: 25 mg via INTRAVENOUS
  Filled 2018-03-02: qty 1

## 2018-03-02 MED ORDER — SODIUM CHLORIDE 0.9 % IV BOLUS
1000.0000 mL | Freq: Once | INTRAVENOUS | Status: AC
  Administered 2018-03-02: 1000 mL via INTRAVENOUS

## 2018-03-02 NOTE — ED Notes (Addendum)
Pt states she has had a headache for 4 days. PPt states she has a history of migraines. She states she has a "mass in the back of my head" but it doesn't hurt when you touch it. She states that her head hurts inside. She states the pain is concenrated in the front of her head. She says this pain is worse than her other migraines. She states she is afraid about the mass on her head. She has no taken any medication since this morning. Denies nausea and vomiting, but does state she has had episodes of being dizzy with blurry vision.

## 2018-03-02 NOTE — ED Triage Notes (Addendum)
Pt reports headache today (migraine). She had her hair up in a pony tail and when she took is down she noticed a golf ball size lump. Reports fever 102. Reports she is also having memory difficulties

## 2018-03-02 NOTE — ED Provider Notes (Signed)
[facility] [facility] EMERGENCY DEPARTMENT Provider Note   CSN: [id number] Arrival date & time: 03/02/18  2125     History   Chief Complaint Chief Complaint  Patient presents with  . Headache    HPI Cheyenne Arias is a 42 y.o. female.  42 year old female with past medical history including migraines, bipolar disorder, asthma, HSV, OSA, anemia, viral neuropathy who presents with headache.  Patient states that 4 days ago she began having headache that was gradual in onset but has been constant and persistent despite taking her home daily migraine medication as well as Maxalt.  The pain is mostly in the front of her head.  She has had headaches previously but they are usually not this severe.  She endorses associated photophobia, phonophobia, dizziness, occasional blurry vision, and nausea/vomiting which she has had with previous migraines.  No extremity numbness or weakness and no problems ambulating.  Patient also notes that today she took down her ponytail and noticed a "mass" on the back of her scalp which is nontender.  She denies any trauma.  Her headache is not located near this area.  She denies any cough/cold symptoms, fevers, or recent illness.  The history is provided by the patient.  Headache      Past Medical History:  Diagnosis Date  . Allergy    seasonal  . Anemia    goes to [facility], ferahem  . Anxiety   . Arthritis    spinal stenosis  . Asthma   . Bipolar disorder ([place])   . Cancer [facility]) 2013   Cervical cancer  . Depression   . Fatty liver disease, nonalcoholic   . Fibromyalgia   . Food poisoning   . GERD (gastroesophageal reflux disease)   . 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 HSV infection   . Hypertension   . Lumbar radiculopathy   . Migraines   . Neuropathy   . Obesity   . OSA (obstructive sleep apnea)   . OSA on CPAP    uses it nightly  . Peripheral neuropathy    LLE - pins and needles  . Shortness of breath dyspnea    with exertion    . Spinal stenosis     Patient Active Problem List   Diagnosis Date Noted  . 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 Hematochezia   . BV (bacterial vaginosis) 10/23/2015  . Pelvic pain in female 10/23/2015  . CAP (community acquired pneumonia) 07/25/2015  . Asthma, chronic 07/02/2015  . OSA (obstructive sleep apnea) 07/02/2015  . Hypokalemia 07/02/2015  . Sepsis due to pneumonia ([place]) 07/02/2015  . Leukocytosis 07/02/2015  . IDA (iron deficiency anemia) 07/02/2015  . Benign essential HTN 07/12/2011  . Fibromyalgia 07/26/2009    Past Surgical History:  Procedure Laterality Date  . CARPAL TUNNEL RELEASE Right   . [place], 2013   x 3  . CESAREAN SECTION  11/28/2011   Procedure: CESAREAN SECTION;  Surgeon: Woodroe Mode, MD;  Location: [facility];  Service: Obstetrics;  Laterality: N/A;  . COLONOSCOPY N/A 04/01/2017   Procedure: COLONOSCOPY;  Surgeon: Ladene Artist, MD;  Location: [facility];  Service: Endoscopy;  Laterality: N/A;  . COLPOSCOPY    . DILITATION & CURRETTAGE/HYSTROSCOPY WITH NOVASURE ABLATION N/A 02/13/2017   Procedure: DILATATION & CURETTAGE/HYSTEROSCOPY WITH NOVASURE ABLATION;  Surgeon: Ena Dawley, MD;  Location: [facility];  Service: Gynecology;  Laterality: N/A;  . FACET JOINT INJECTION     multiple  injections  . HYSTEROSCOPY W/D&C N/A 08/17/2015   Procedure: DILATATION AND CURETTAGE /HYSTEROSCOPY;  Surgeon: Ena Dawley, MD;  Location: [facility];  Service: Gynecology;  Laterality: N/A;  . MANDIBLE SURGERY  2008  . WISDOM TOOTH EXTRACTION       OB History    Gravida  3   Para  3   Term  3   Preterm      AB      Living  3     SAB      TAB      Ectopic      Multiple      Live Births  3            Home Medications    Prior to Admission medications   Medication Sig Start Date End Date Taking? Authorizing Provider  albuterol (PROAIR HFA) 108 (90 Base) MCG/ACT inhaler Inhale 2 puffs into the lungs every 6 (six)  hours as needed for wheezing or shortness of breath. 02/26/18   Lauraine Rinne, NP  amLODipine (NORVASC) 10 MG tablet Take 10 mg by mouth daily.    [provider]  ARIPiprazole (ABILIFY) 2 MG tablet Take 1 tablet (2 mg total) by mouth at bedtime. 05/08/17   Merian Capron, MD  budesonide (PULMICORT) 0.5 MG/2ML nebulizer solution TAKE 2 MLS BY NEBULIZATION 2 TIMES DAILY. DX CODE: CHRONIC ASTHMA J45.909 03/05/17   Magdalen Spatz, NP  clonazePAM (KLONOPIN) 0.5 MG tablet TAKE 1 TABLET TWICE A DAY AS NEEDED FOR ANXIETY 07/12/17   Merian Capron, MD  cyclobenzaprine (FLEXERIL) 10 MG tablet Take 10 mg by mouth 3 (three) times daily.     [provider]  ferumoxytol Shirlean Kelly) 510 MG/17ML SOLN injection Inject 510 mg into the vein every 3 (three) months.     [provider]  FLUoxetine (PROZAC) 20 MG capsule Take 1 capsule (20 mg total) by mouth daily. 05/08/17   Merian Capron, MD  fluticasone (FLONASE) 50 MCG/ACT nasal spray PLACE 2 SPRAYS INTO BOTH NOSTRILS 2 (TWO) TIMES DAILY. 04/22/17   Chesley Mires, MD  furosemide (LASIX) 80 MG tablet Take 80 mg by mouth 2 (two) times daily.    [provider]  ibuprofen (ADVIL,MOTRIN) 800 MG tablet Take 800 mg by mouth 3 (three) times daily as needed for moderate pain.    [provider]  ipratropium-albuterol (DUONEB) 0.5-2.5 (3) MG/3ML SOLN TAKE 3 MLS BY NEBULIZATION EVERY 4 HOURS AS NEEDED (FOR WHEEZING/SHORTNESS OF BREATH). 03/05/17   Magdalen Spatz, NP  loratadine (CLARITIN) 10 MG tablet TAKE 10 MG BY MOUTH at night 04/22/17   Chesley Mires, MD  metoprolol tartrate (LOPRESSOR) 25 MG tablet Take 25 mg by mouth 2 (two) times daily.     [provider]  montelukast (SINGULAIR) 10 MG tablet TAKE 10 MG BY MOUTH AT BEDTIME 01/08/18   Chesley Mires, MD  Multiple Vitamins-Minerals (MULTIVITAMIN WITH MINERALS) tablet Take 1 tablet by mouth daily.    [provider]  ondansetron (ZOFRAN) 4 MG tablet Take 1 tablet (4 mg  total) by mouth every 8 (eight) hours as needed for nausea or vomiting. 02/13/17   Ena Dawley, MD  oxyCODONE (ROXICODONE) 15 MG immediate release tablet Take 15 mg by mouth every 6 (six) hours as needed for pain.    [provider]  OXYGEN Inhale 2 L into the lungs at bedtime.    [provider]  pantoprazole (PROTONIX) 40 MG tablet Take 40 mg by mouth at night  09/06/17   Chesley Mires, MD  potassium chloride SA (K-DUR,KLOR-CON) 20 MEQ tablet Take 1 tablet (20 mEq total) by mouth daily. Patient taking differently: Take 20 mEq by mouth 2 (two) times daily.  08/06/16   Tresea Mall, CNM  pregabalin (LYRICA) 300 MG capsule Take 300 mg by mouth 2 (two) times daily.     [provider]  rizatriptan (MAXALT) 10 MG tablet Take 10 mg by mouth as needed for migraine. May repeat in 2 hours if needed    [provider]  topiramate (TOPAMAX) 100 MG tablet Take 100 mg by mouth 2 (two) times daily.     [provider]  triamcinolone cream (KENALOG) 0.1 % Apply 1 application topically daily as needed (for rash).  07/23/16   [provider]  zolpidem (AMBIEN) 5 MG tablet Take 5 mg by mouth at bedtime as needed for sleep.   05/10/14  [provider]    Family History Family History  Problem Relation Age of Onset  . Hypertension Mother   . Sickle cell trait Maternal Aunt   . Sickle cell anemia Other   . Schizophrenia Son   . Depression Daughter   . Kidney disease Father        transplant required  . Stomach cancer Neg Hx   . Colon cancer Neg Hx     Social History Social History   Tobacco Use  . Smoking status: Never Smoker  . Smokeless tobacco: Never Used  . Tobacco comment: Pt was exposed to 2nd hand smoke at work years ago.  Substance Use Topics  . Alcohol use: No  . Drug use: No     Allergies   Cymbalta [duloxetine hcl]; Latex; and Morphine and related   Review of Systems Review of Systems  Neurological: Positive for  headaches.   All other systems reviewed and are negative except that which was mentioned in HPI   Physical Exam Updated Vital Signs BP (!) 93/54   Pulse (!) 101   Temp 98.5 F (36.9 C) (Oral)   Resp 20   SpO2 96%   Physical Exam  Constitutional: She is oriented to person, place, and time. She appears well-developed and well-nourished. No distress.  Awake, alert  HENT:  Head: Normocephalic and atraumatic.  Mild firmness of skin on central occipital scalp with no skin changes, warmth, fluctuance, or tenderness to palpation  Eyes: Pupils are equal, round, and reactive to light. Conjunctivae and EOM are normal.  Neck: Neck supple.  Cardiovascular: Normal rate, regular rhythm and normal heart sounds.  No murmur heard. Pulmonary/Chest: Effort normal and breath sounds normal. No respiratory distress.  Abdominal: Soft. Bowel sounds are normal. She exhibits no distension. There is no tenderness.  Musculoskeletal: She exhibits no edema.  Neurological: She is alert and oriented to person, place, and time. She has normal reflexes. No cranial nerve deficit. She exhibits normal muscle tone.  Fluent speech, normal finger-to-nose testing, negative pronator drift, no clonus 5/5 strength and normal sensation x all 4 extremities  Skin: Skin is warm and dry.  Psychiatric: Judgment and thought content normal.  Anxious, tearful  Nursing note and vitals reviewed.    ED Treatments / Results  Labs (all labs ordered are listed, but only abnormal results are displayed) Labs Reviewed  PREGNANCY, URINE    EKG None  Radiology Ct Head Wo Contrast  Result Date: 03/02/2018 CLINICAL DATA:  Headache EXAM: CT HEAD WITHOUT CONTRAST TECHNIQUE: Contiguous axial images were obtained from the base  of the skull through the vertex without intravenous contrast. COMPARISON:  None. FINDINGS: Brain: No acute intracranial abnormality. Specifically, no hemorrhage, hydrocephalus, mass lesion, acute infarction, or  significant intracranial injury. Vascular: No hyperdense vessel or unexpected calcification. Skull: No acute calvarial abnormality. Sinuses/Orbits: Visualized paranasal sinuses and mastoids clear. Orbital soft tissues unremarkable. Other: None IMPRESSION: Normal study. Electronically Signed   By: Rolm Baptise M.D.   On: 03/02/2018 23:31    Procedures Procedures (including critical care time)  Medications Ordered in ED Medications  sodium chloride 0.9 % bolus 1,000 mL ( Intravenous Rate/Dose Verify 03/02/18 2345)  diphenhydrAMINE (BENADRYL) injection 25 mg (25 mg Intravenous Given 03/02/18 2306)  prochlorperazine (COMPAZINE) injection 10 mg (10 mg Intravenous Given 03/02/18 2305)  magnesium sulfate IVPB 2 g 50 mL ( Intravenous Stopped 03/02/18 2343)     Initial Impression / Assessment and Plan / ED Course  I have reviewed the triage vital signs and the nursing notes.  Pertinent labs & imaging results that were available during my care of the patient were reviewed by me and considered in my medical decision making (see chart for details).     Neurologically intact, reassuring VS. Area of posterior scalp does not appear to be abscess or other skin infection. No traumatic injuries. Pt concerned about this area therefore obtained CT head which was normal. Gave migraine cocktail.  On reassessment, she is resting comfortably.  When awakened, she states that she still has persistent headache.  I have ordered Toradol and Decadron.  I am signing out to the oncoming provider who will follow up on her progress after more medications.  I have discussed her head CT findings.  Final Clinical Impressions(s) / ED Diagnoses   Final diagnoses:  None    ED Discharge Orders    None       Sandria Mcenroe, Wenda Overland, MD 03/03/18 0004

## 2018-03-03 DIAGNOSIS — G43911 Migraine, unspecified, intractable, with status migrainosus: Secondary | ICD-10-CM | POA: Diagnosis not present

## 2018-03-03 MED ORDER — KETOROLAC TROMETHAMINE 30 MG/ML IJ SOLN
30.0000 mg | Freq: Once | INTRAMUSCULAR | Status: AC
  Administered 2018-03-03: 30 mg via INTRAVENOUS
  Filled 2018-03-03: qty 1

## 2018-03-03 MED ORDER — DEXAMETHASONE SODIUM PHOSPHATE 10 MG/ML IJ SOLN
10.0000 mg | Freq: Once | INTRAMUSCULAR | Status: AC
  Administered 2018-03-03: 10 mg via INTRAVENOUS
  Filled 2018-03-03: qty 1

## 2018-03-03 NOTE — ED Notes (Signed)
Pt did not wait for MD to come speak with her regarding d/c instructions.

## 2018-03-03 NOTE — ED Notes (Signed)
Pt has questions regarding CT results. Explained that her CT was normal. Pt requested to speak with MD. Dr. Dina Rich aware.

## 2018-03-03 NOTE — ED Provider Notes (Signed)
Signed out pending head CT and repeat evaluation.  History of headaches.  Presents with headache.  Was given a migraine cocktail but continued to have some discomfort.  Patient was given second dose of medications.  On my evaluation she is resting comfortably and is in no acute distress.  She reports that her headache is much better.  CT scan is within normal limits.  We will have her follow-up with her primary physician and neurologist.   Merryl Hacker, MD 03/03/18 [id number]

## 2018-03-03 NOTE — Telephone Encounter (Signed)
Attempted to call pt multiple times but each time tried calling pt, immediately got a busy signal. Will try to call back later.

## 2018-03-03 NOTE — ED Notes (Signed)
Pt laying on her side asleep when vitals taken.

## 2018-03-04 NOTE — Telephone Encounter (Signed)
Attempted to call pt but unable to reach her and unable to leave a VM. Due to multiple attempts trying to reach pt, per triage protocol encounter will be closed.

## 2018-03-09 NOTE — Progress Notes (Deleted)
@Patient  ID: Cheyenne Arias, female    DOB: Aug 07, 1975, 42 y.o.   MRN: [id number]  No chief complaint on file.   Referring provider: Josephina Gip, NP  HPI:   PMH:  Smoker/ Smoking History:  Maintenance:   Pt of:   Recent [facility] Encounters:     03/09/2018  - Visit   HPI  Tests:    FENO:  No results found for: NITRICOXIDE  PFT: No flowsheet data found.  Imaging: Ct Head Wo Contrast  Result Date: 03/02/2018 CLINICAL DATA:  Headache EXAM: CT HEAD WITHOUT CONTRAST TECHNIQUE: Contiguous axial images were obtained from the base of the skull through the vertex without intravenous contrast. COMPARISON:  None. FINDINGS: Brain: No acute intracranial abnormality. Specifically, no hemorrhage, hydrocephalus, mass lesion, acute infarction, or significant intracranial injury. Vascular: No hyperdense vessel or unexpected calcification. Skull: No acute calvarial abnormality. Sinuses/Orbits: Visualized paranasal sinuses and mastoids clear. Orbital soft tissues unremarkable. Other: None IMPRESSION: Normal study. Electronically Signed   By: Rolm Baptise M.D.   On: 03/02/2018 23:31    Chart Review:    Specialty Problems      Pulmonary Problems   Asthma, chronic   OSA (obstructive sleep apnea)   Sepsis due to pneumonia ([place])   CAP (community acquired pneumonia)      Allergies  Allergen Reactions  . Cymbalta [Duloxetine Hcl] Other (See Comments)    Head felt cold and tight, loss of conciousness  . Latex Itching, Swelling and Other (See Comments)    Reaction:  Blisters   . Morphine And Related Itching    Immunization History  Administered Date(s) Administered  . Influenza Split 11/25/2014  . Influenza,inj,Quad PF,6+ Mos 02/02/2013, 01/18/2017  . Influenza-Unspecified 03/23/2014    Past Medical History:  Diagnosis Date  . Allergy    seasonal  . Anemia    goes to [facility], ferahem  . Anxiety   . Arthritis    spinal stenosis  . Asthma   . Bipolar  disorder ([place])   . Cancer [facility]) 2013   Cervical cancer  . Depression   . Fatty liver disease, nonalcoholic   . Fibromyalgia   . Food poisoning   . GERD (gastroesophageal reflux disease)   . 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 HSV infection   . Hypertension   . Lumbar radiculopathy   . Migraines   . Neuropathy   . Obesity   . OSA (obstructive sleep apnea)   . OSA on CPAP    uses it nightly  . Peripheral neuropathy    LLE - pins and needles  . Shortness of breath dyspnea    with exertion  . Spinal stenosis     Tobacco History: Social History   Tobacco Use  Smoking Status Never Smoker  Smokeless Tobacco Never Used  Tobacco Comment   Pt was exposed to 2nd hand smoke at work years ago.   Counseling given: Not Answered Comment: Pt was exposed to 2nd hand smoke at work years ago.   Outpatient Encounter Medications as of 03/10/2018  Medication Sig  . albuterol (PROAIR HFA) 108 (90 Base) MCG/ACT inhaler Inhale 2 puffs into the lungs every 6 (six) hours as needed for wheezing or shortness of breath.  [name] amLODipine (NORVASC) 10 MG tablet Take 10 mg by mouth daily.  . ARIPiprazole (ABILIFY) 2 MG tablet Take 1 tablet (2 mg total) by mouth at bedtime.  . budesonide (PULMICORT) 0.5 MG/2ML nebulizer solution TAKE 2 MLS  BY NEBULIZATION 2 TIMES DAILY. DX CODE: CHRONIC ASTHMA J45.909  . clonazePAM (KLONOPIN) 0.5 MG tablet TAKE 1 TABLET TWICE A DAY AS NEEDED FOR ANXIETY  . cyclobenzaprine (FLEXERIL) 10 MG tablet Take 10 mg by mouth 3 (three) times daily.   . ferumoxytol (FERAHEME) 510 MG/17ML SOLN injection Inject 510 mg into the vein every 3 (three) months.   [name] FLUoxetine (PROZAC) 20 MG capsule Take 1 capsule (20 mg total) by mouth daily.  . fluticasone (FLONASE) 50 MCG/ACT nasal spray PLACE 2 SPRAYS INTO BOTH NOSTRILS 2 (TWO) TIMES DAILY.  . furosemide (LASIX) 80 MG tablet Take 80 mg by mouth 2 (two) times daily.  [name] ibuprofen (ADVIL,MOTRIN) 800 MG tablet Take 800 mg by mouth 3  (three) times daily as needed for moderate pain.  [name] ipratropium-albuterol (DUONEB) 0.5-2.5 (3) MG/3ML SOLN TAKE 3 MLS BY NEBULIZATION EVERY 4 HOURS AS NEEDED (FOR WHEEZING/SHORTNESS OF BREATH).  [name] loratadine (CLARITIN) 10 MG tablet TAKE 10 MG BY MOUTH at night  . metoprolol tartrate (LOPRESSOR) 25 MG tablet Take 25 mg by mouth 2 (two) times daily.   . montelukast (SINGULAIR) 10 MG tablet TAKE 10 MG BY MOUTH AT BEDTIME  . Multiple Vitamins-Minerals (MULTIVITAMIN WITH MINERALS) tablet Take 1 tablet by mouth daily.  . ondansetron (ZOFRAN) 4 MG tablet Take 1 tablet (4 mg total) by mouth every 8 (eight) hours as needed for nausea or vomiting.  [name] oxyCODONE (ROXICODONE) 15 MG immediate release tablet Take 15 mg by mouth every 6 (six) hours as needed for pain.  . OXYGEN Inhale 2 L into the lungs at bedtime.  . pantoprazole (PROTONIX) 40 MG tablet Take 40 mg by mouth at night  . potassium chloride SA (K-DUR,KLOR-CON) 20 MEQ tablet Take 1 tablet (20 mEq total) by mouth daily. (Patient taking differently: Take 20 mEq by mouth 2 (two) times daily. )  . pregabalin (LYRICA) 300 MG capsule Take 300 mg by mouth 2 (two) times daily.   . rizatriptan (MAXALT) 10 MG tablet Take 10 mg by mouth as needed for migraine. May repeat in 2 hours if needed  . topiramate (TOPAMAX) 100 MG tablet Take 100 mg by mouth 2 (two) times daily.   [name] triamcinolone cream (KENALOG) 0.1 % Apply 1 application topically daily as needed (for rash).   . [DISCONTINUED] zolpidem (AMBIEN) 5 MG tablet Take 5 mg by mouth at bedtime as needed for sleep.    No facility-administered encounter medications on file as of 03/10/2018.      Review of Systems  Review of Systems   Physical Exam  There were no vitals taken for this visit.  Wt Readings from Last 5 Encounters:  01/17/18 286 lb (129.7 kg)  12/12/17 292 lb (132.5 kg)  11/25/17 291 lb 3.2 oz (132.1 kg)  05/21/17 294 lb 3.2 oz (133.4 kg)  04/01/17 280 lb (127 kg)     Physical Exam      Lab Results:  CBC    Component Value Date/Time   WBC 10.8 (H) 11/27/2017 0907   RBC 4.73 11/27/2017 0907   HGB 12.5 11/27/2017 0907   HGB 11.5 (L) 05/21/2017 1334   HGB 8.5 (L) 12/11/2016 1213   HCT 38.7 11/27/2017 0907   HCT 30.0 (L) 12/11/2016 1213   PLT 345.0 11/27/2017 0907   PLT 436 (H) 05/21/2017 1334   PLT 392 12/11/2016 1213   MCV 81.9 11/27/2017 0907   MCV 62.2 (L) 12/11/2016 1213   MCH 21.5 (L) 05/21/2017 1334   MCHC  32.3 11/27/2017 0907   RDW 17.1 (H) 11/27/2017 0907   RDW 18.3 (H) 12/11/2016 1213   LYMPHSABS 1.9 11/27/2017 0907   LYMPHSABS 2.5 12/11/2016 1213   MONOABS 0.6 11/27/2017 0907   MONOABS 0.5 12/11/2016 1213   EOSABS 0.2 11/27/2017 0907   EOSABS 0.2 12/11/2016 1213   BASOSABS 0.1 11/27/2017 0907   BASOSABS 0.0 12/11/2016 1213    BMET    Component Value Date/Time   NA 137 03/10/2017 1735   NA 139 08/29/2012 1102   K 3.0 (L) 03/10/2017 1735   K 3.2 (L) 08/29/2012 1102   CL 107 03/10/2017 1735   CL 105 08/29/2012 1102   CO2 23 03/10/2017 1735   CO2 23 08/29/2012 1102   GLUCOSE 107 (H) 03/10/2017 1735   GLUCOSE 130 (H) 08/29/2012 1102   BUN 11 03/10/2017 1735   BUN 5.8 (L) 08/29/2012 1102   CREATININE 0.57 03/10/2017 1735   CREATININE 0.8 08/29/2012 1102   CALCIUM 8.6 (L) 03/10/2017 1735   CALCIUM 8.6 08/29/2012 1102   GFRNONAA >60 03/10/2017 1735   GFRAA >60 03/10/2017 1735    BNP No results found for: BNP  ProBNP    Component Value Date/Time   PROBNP 6.1 01/06/2014 0800      Assessment & Plan:     No problem-specific Assessment & Plan notes found for this encounter.     Lauraine Rinne, NP 03/09/2018

## 2018-03-10 ENCOUNTER — Ambulatory Visit: Payer: Self-pay | Admitting: Pulmonary Disease

## 2018-03-10 ENCOUNTER — Ambulatory Visit: Payer: Self-pay

## 2018-03-15 ENCOUNTER — Encounter (HOSPITAL_COMMUNITY): Payer: Self-pay

## 2018-03-15 ENCOUNTER — Inpatient Hospital Stay (HOSPITAL_COMMUNITY)
Admission: AD | Admit: 2018-03-15 | Discharge: 2018-03-15 | Disposition: A | Discharge status: Home / Self Care | Payer: 59 | Source: Ambulatory Visit | Attending: Obstetrics and Gynecology | Admitting: Obstetrics and Gynecology

## 2018-03-15 DIAGNOSIS — E1142 Type 2 diabetes mellitus with diabetic polyneuropathy: Secondary | ICD-10-CM | POA: Insufficient documentation

## 2018-03-15 DIAGNOSIS — E669 Obesity, unspecified: Secondary | ICD-10-CM | POA: Diagnosis not present

## 2018-03-15 DIAGNOSIS — T2152XA Corrosion of first degree of abdominal wall, initial encounter: Secondary | ICD-10-CM

## 2018-03-15 DIAGNOSIS — X58XXXA Exposure to other specified factors, initial encounter: Secondary | ICD-10-CM | POA: Insufficient documentation

## 2018-03-15 DIAGNOSIS — Z888 Allergy status to other drugs, medicaments and biological substances status: Secondary | ICD-10-CM | POA: Diagnosis not present

## 2018-03-15 DIAGNOSIS — Z91040 Latex allergy status: Secondary | ICD-10-CM | POA: Diagnosis not present

## 2018-03-15 DIAGNOSIS — Z8249 Family history of ischemic heart disease and other diseases of the circulatory system: Secondary | ICD-10-CM | POA: Insufficient documentation

## 2018-03-15 DIAGNOSIS — T2102XA Burn of unspecified degree of abdominal wall, initial encounter: Secondary | ICD-10-CM | POA: Insufficient documentation

## 2018-03-15 DIAGNOSIS — Z885 Allergy status to narcotic agent status: Secondary | ICD-10-CM | POA: Diagnosis not present

## 2018-03-15 DIAGNOSIS — Z841 Family history of disorders of kidney and ureter: Secondary | ICD-10-CM | POA: Insufficient documentation

## 2018-03-15 DIAGNOSIS — R109 Unspecified abdominal pain: Secondary | ICD-10-CM | POA: Diagnosis present

## 2018-03-15 MED ORDER — KETOROLAC TROMETHAMINE 60 MG/2ML IM SOLN
60.0000 mg | Freq: Once | INTRAMUSCULAR | Status: AC
  Administered 2018-03-15: 60 mg via INTRAMUSCULAR
  Filled 2018-03-15: qty 2

## 2018-03-15 NOTE — MAU Provider Note (Signed)
History     CSN: [id number]  Arrival date and time: 03/15/18 1710   First Provider Initiated Contact with Patient 03/15/18 1809      Chief Complaint  Patient presents with  . Abdominal Pain  . burn   Cheyenne Arias presents for burns on her belly She states she used Carlton Adam hair removal cream on her pubic region about 4-5 hours ago She left the cream on for 3 minutes After removing the cream, she noticed her skin came off with the cream She was left with open skin that looked burned on the underside of her belly She denies any problems with the skin prior to the application of Carlton Adam Denies any history of fungal or yeast infections in that area History of diabetes, which she reports is well controlled   Past Medical History:  Diagnosis Date  . Allergy    seasonal  . Anemia    goes to [facility], ferahem  . Anxiety   . Arthritis    spinal stenosis  . Asthma   . Bipolar disorder ([place])   . Cancer [facility]) 2013   Cervical cancer  . Depression   . Fatty liver disease, nonalcoholic   . Fibromyalgia   . Food poisoning   . GERD (gastroesophageal reflux disease)   . 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 HSV infection   . Hypertension   . Lumbar radiculopathy   . Migraines   . Neuropathy   . Obesity   . OSA (obstructive sleep apnea)   . OSA on CPAP    uses it nightly  . Peripheral neuropathy    LLE - pins and needles  . Shortness of breath dyspnea    with exertion  . Spinal stenosis     Past Surgical History:  Procedure Laterality Date  . CARPAL TUNNEL RELEASE Right   . [place], 2013   x 3  . CESAREAN SECTION  11/28/2011   Procedure: CESAREAN SECTION;  Surgeon: Woodroe Mode, MD;  Location: [facility];  Service: Obstetrics;  Laterality: N/A;  . COLONOSCOPY N/A 04/01/2017   Procedure: COLONOSCOPY;  Surgeon: Ladene Artist, MD;  Location: [facility];  Service: Endoscopy;  Laterality: N/A;  . COLPOSCOPY    . DILITATION & CURRETTAGE/HYSTROSCOPY WITH  NOVASURE ABLATION N/A 02/13/2017   Procedure: DILATATION & CURETTAGE/HYSTEROSCOPY WITH NOVASURE ABLATION;  Surgeon: Ena Dawley, MD;  Location: [facility];  Service: Gynecology;  Laterality: N/A;  . FACET JOINT INJECTION     multiple injections  . HYSTEROSCOPY W/D&C N/A 08/17/2015   Procedure: DILATATION AND CURETTAGE /HYSTEROSCOPY;  Surgeon: Ena Dawley, MD;  Location: [facility];  Service: Gynecology;  Laterality: N/A;  . MANDIBLE SURGERY  2008  . WISDOM TOOTH EXTRACTION      Family History  Problem Relation Age of Onset  . Hypertension Mother   . Sickle cell trait Maternal Aunt   . Sickle cell anemia Other   . Schizophrenia Son   . Depression Daughter   . Kidney disease Father        transplant required  . Stomach cancer Neg Hx   . Colon cancer Neg Hx     Social History   Tobacco Use  . Smoking status: Never Smoker  . Smokeless tobacco: Never Used  . Tobacco comment: Pt was exposed to 2nd hand smoke at work years ago.  Substance Use Topics  . Alcohol use: No  . Drug use: No    Allergies:  Allergies  Allergen Reactions  . Cymbalta [Duloxetine Hcl] Other (See Comments)    Head felt cold and tight, loss of conciousness  . Latex Itching, Swelling and Other (See Comments)    Reaction:  Blisters   . Morphine And Related Itching    No medications prior to admission.    Review of Systems  All other systems reviewed and are negative.  Physical Exam   Blood pressure (!) 106/54, pulse 99, temperature 98.6 F (37 C), temperature source Oral, resp. rate 18, weight 129.3 kg, SpO2 98 %.  Physical Exam  Constitutional: She is oriented to person, place, and time. She appears well-developed and well-nourished. No distress.  Obese  HENT:  Head: Normocephalic and atraumatic.  Eyes: Right eye exhibits no discharge. Left eye exhibits no discharge. No scleral icterus.  Cardiovascular: Normal rate and regular rhythm. Exam reveals no friction rub.  No murmur  heard. Respiratory: Effort normal. No respiratory distress.  GI: Soft. There is no tenderness. There is no rebound.  Neurological: She is alert and oriented to person, place, and time.  Skin:  Skin of underside of pannus patchy areas of denuding L>R No erythema, edema, drainage, warmth    MAU Course  Procedures  Assessment and Plan  Superficial chemical burn of abdominal wall, initial encounter - Plan: Discharge patient - Patient has a superficial chemical burn without signs of secondary infection - Discussed general wound care - Return precautions discussed - Follow up PRN    Sturgis Fellow  03/15/2018, 7:15 PM

## 2018-03-15 NOTE — Discharge Instructions (Signed)
Chemical Burn A chemical skin burn is an injury to your skin. It can also injure the structures below your skin. This type of burn is caused by coming into contact with a chemical that can damage and kill tissue (caustic chemical). A chemical burn can be more serious than other burns. This is because the chemicals are typically on your skin for a longer time. In some cases, some chemicals continue to cause damage even after they have been removed from the skin. Follow these instructions at home: Medicines  Take and apply over-the-counter and prescription medicines only as told by your doctor.  If you were prescribed antibiotic medicine, take or apply it as told by your doctor. Do not stop using the antibiotic even if your condition gets better. Burn care  Follow instructions from your doctor about: ? How to clean your burn. ? How to take care of your burn. ? When and how you should remove your bandage (dressing).  Check your burn every day for signs of infection. Check for: ? More redness, swelling, or pain. ? Fluid or blood. ? Warmth. ? Pus or a bad smell.  Keep the bandage dry until your doctor says it can be taken off. Do not take baths, swim, use a hot tub, or do anything that would put your burn underwater until your doctor says it is okay. Activity  Do any range-of-motion movements as told by your physical therapist, if this applies.  Rest as told by your doctor. Do not exercise until your doctor says it is okay. General instructions  Raise (elevate) the injured area above the level of your heart while you are sitting or lying down.  Do not scratch or pick at the burn.  Do not break any blisters you may have. Do not peel any skin.  Protect your burn from the sun.  Drink enough fluid to keep your pee (urine) clear or pale yellow.  Keep all follow-up visits as told by your doctor. This is important. How is this prevented?  Try to avoid being around chemical that can  cause burns.  Wear protective gloves and equipment when you touch dangerous chemicals.  Make sure all dangerous chemicals are labeled.  Where you work, ask about which dangerous chemicals they use. Ask if a safer chemical can be used instead.  Make sure there is enough clean air in any place that has dangerous chemicals.  Keep your skin clean and moist (moisturized). Dry skin is more likely to be hurt by chemicals. Contact a doctor if:  You got a tetanus shot and you have any of these problems in the area of the shot: ? Swelling. ? Very bad pain. ? Redness. ? Bleeding.  Your symptoms do not get better with treatment.  Medicine does not help your pain.  You have more redness, swelling, or pain around your burn.  Your burn feels warm to the touch.  Your burn looks different, or it has black or red spots. Get help right away if:  You have red streaks near the burn.  You have fluid, blood, or pus coming from the burn.  You have very bad swelling.  You have very bad pain.  There is a bad smell coming from your burn.  You have a fever.  You have numbness or tingling from the burned area or further down your legs or arms.  You have trouble breathing.  You are coughing or wheezing.  You have chest pain. This information is not intended  to replace advice given to you by your health care provider. Make sure you discuss any questions you have with your health care provider. Document Released: 05/10/2004 Document Revised: 11/25/2015 Document Reviewed: 11/25/2015 Elsevier Interactive Patient Education  2017 [employer].

## 2018-03-15 NOTE — MAU Note (Addendum)
Cheyenne Arias is a 42 y.o.  here in MAU reporting: that she used Carlton Adam under her stomach 3-4 hours ago. States it peeled her skin off. Patient applied neosporin and gauze pads at home. Reports green discharge from the area. States its very painful. Stabbing and sharp in nature.  LMP: ablation Pain score: 10/10. Last took ibuprofen 3-4 hours ago Vitals:   03/15/18 1736  BP: (!) 158/87  Pulse: (!) 105  Resp: 18  Temp: 98.6 F (37 C)  SpO2: 98%      Lab orders placed from triage: ua/pregnancy test. Patient states she doesn't want to use the bathroom right now. Nor move because it makes the pain worse. "I can barely walk or move".

## 2018-03-17 ENCOUNTER — Ambulatory Visit: Payer: Self-pay

## 2018-03-17 ENCOUNTER — Ambulatory Visit: Payer: Self-pay | Admitting: Pulmonary Disease

## 2018-04-21 ENCOUNTER — Ambulatory Visit: Payer: Self-pay | Admitting: Primary Care

## 2018-04-21 NOTE — Progress Notes (Deleted)
@Patient  ID: Cheyenne Arias, female    DOB: 19-Oct-1975, 43 y.o.   MRN: [id number]  No chief complaint on file.   Referring provider: [facility]  HPI: 43 year old female, never smoked. PMH significant for asthma, OSA, HTN, fibromyalgia. Patient of Dr. Halford Chessman, last seen on 11/25/17. Maintained on Pulmicort nebulizer twice daily, Singulair and prn duonebs.   04/21/2018 Patient presents today for acute visit with complaints of sinus issues waking her up at night.    Allergies  Allergen Reactions  . Cymbalta [Duloxetine Hcl] Other (See Comments)    Head felt cold and tight, loss of conciousness  . Latex Itching, Swelling and Other (See Comments)    Reaction:  Blisters   . Morphine And Related Itching    Immunization History  Administered Date(s) Administered  . Influenza Split 11/25/2014  . Influenza,inj,Quad PF,6+ Mos 02/02/2013, 01/18/2017  . Influenza-Unspecified 03/23/2014    Past Medical History:  Diagnosis Date  . Allergy    seasonal  . Anemia    go[facility] to [facility], ferahem  . Anxiety   . Arthritis    spinal stenosis  . Asthma   . Bipolar disorder ([place])   . Cancer [facility]) 2013   Cervical cancer  . Depression   . Fatty liver disease, nonalcoholic   . Fibromyalgia   . Food poisoning   . GERD (gastroesophageal reflux disease)   . 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 HSV infection   . Hypertension   . Lumbar radiculopathy   . Migraines   . Neuropathy   . Obesity   . OSA (obstructive sleep apnea)   . OSA on CPAP    uses it nightly  . Peripheral neuropathy    LLE - pins and needles  . Shortness of breath dyspnea    with exertion  . Spinal stenosis     Tobacco History: Social History   Tobacco Use  Smoking Status Never Smoker  Smokeless Tobacco Never Used  Tobacco Comment   Pt was exposed to 2nd hand smoke at work years ago.   Counseling given: Not Answered Comment: Pt was exposed to 2nd hand smoke at work years ago.   Outpatient  Medications Prior to Visit  Medication Sig Dispense Refill  . albuterol (PROAIR HFA) 108 (90 Base) MCG/ACT inhaler Inhale 2 puffs into the lungs every 6 (six) hours as needed for wheezing or shortness of breath. 1 Inhaler 0  . amLODipine (NORVASC) 10 MG tablet Take 10 mg by mouth daily.    . ARIPiprazole (ABILIFY) 2 MG tablet Take 1 tablet (2 mg total) by mouth at bedtime. 30 tablet 2  . budesonide (PULMICORT) 0.5 MG/2ML nebulizer solution TAKE 2 MLS BY NEBULIZATION 2 TIMES DAILY. DX CODE: CHRONIC ASTHMA J45.909 120 mL 5  . clonazePAM (KLONOPIN) 0.5 MG tablet TAKE 1 TABLET TWICE A DAY AS NEEDED FOR ANXIETY 60 tablet 0  . cyclobenzaprine (FLEXERIL) 10 MG tablet Take 10 mg by mouth 3 (three) times daily.     . ferumoxytol (FERAHEME) 510 MG/17ML SOLN injection Inject 510 mg into the vein every 3 (three) months.     [name] FLUoxetine (PROZAC) 20 MG capsule Take 1 capsule (20 mg total) by mouth daily. 30 capsule 2  . fluticasone (FLONASE) 50 MCG/ACT nasal spray PLACE 2 SPRAYS INTO BOTH NOSTRILS 2 (TWO) TIMES DAILY. 16 g 5  . furosemide (LASIX) 80 MG tablet Take 80 mg by mouth 2 (two) times daily.    [name]  ibuprofen (ADVIL,MOTRIN) 800 MG tablet Take 800 mg by mouth 3 (three) times daily as needed for moderate pain.    [name] ipratropium-albuterol (DUONEB) 0.5-2.5 (3) MG/3ML SOLN TAKE 3 MLS BY NEBULIZATION EVERY 4 HOURS AS NEEDED (FOR WHEEZING/SHORTNESS OF BREATH). 360 mL 5  . loratadine (CLARITIN) 10 MG tablet TAKE 10 MG BY MOUTH at night 90 tablet 1  . metoprolol tartrate (LOPRESSOR) 25 MG tablet Take 25 mg by mouth 2 (two) times daily.     . montelukast (SINGULAIR) 10 MG tablet TAKE 10 MG BY MOUTH AT BEDTIME 30 tablet 5  . Multiple Vitamins-Minerals (MULTIVITAMIN WITH MINERALS) tablet Take 1 tablet by mouth daily.    . ondansetron (ZOFRAN) 4 MG tablet Take 1 tablet (4 mg total) by mouth every 8 (eight) hours as needed for nausea or vomiting. 10 tablet 0  . oxyCODONE (ROXICODONE) 15 MG immediate release tablet Take  15 mg by mouth every 6 (six) hours as needed for pain.    . OXYGEN Inhale 2 L into the lungs at bedtime.    . pantoprazole (PROTONIX) 40 MG tablet Take 40 mg by mouth at night 90 tablet 3  . potassium chloride SA (K-DUR,KLOR-CON) 20 MEQ tablet Take 1 tablet (20 mEq total) by mouth daily. (Patient taking differently: Take 20 mEq by mouth 2 (two) times daily. ) 7 tablet 0  . pregabalin (LYRICA) 300 MG capsule Take 300 mg by mouth 2 (two) times daily.     . rizatriptan (MAXALT) 10 MG tablet Take 10 mg by mouth as needed for migraine. May repeat in 2 hours if needed    . topiramate (TOPAMAX) 100 MG tablet Take 100 mg by mouth 2 (two) times daily.     [name] triamcinolone cream (KENALOG) 0.1 % Apply 1 application topically daily as needed (for rash).      No facility-administered medications prior to visit.       Review of Systems  Review of Systems   Physical Exam  There were no vitals taken for this visit. Physical Exam   Lab Results:  CBC    Component Value Date/Time   WBC 10.8 (H) 11/27/2017 0907   RBC 4.73 11/27/2017 0907   HGB 12.5 11/27/2017 0907   HGB 11.5 (L) 05/21/2017 1334   HGB 8.5 (L) 12/11/2016 1213   HCT 38.7 11/27/2017 0907   HCT 30.0 (L) 12/11/2016 1213   PLT 345.0 11/27/2017 0907   PLT 436 (H) 05/21/2017 1334   PLT 392 12/11/2016 1213   MCV 81.9 11/27/2017 0907   MCV 62.2 (L) 12/11/2016 1213   MCH 21.5 (L) 05/21/2017 1334   MCHC 32.3 11/27/2017 0907   RDW 17.1 (H) 11/27/2017 0907   RDW 18.3 (H) 12/11/2016 1213   LYMPHSABS 1.9 11/27/2017 0907   LYMPHSABS 2.5 12/11/2016 1213   MONOABS 0.6 11/27/2017 0907   MONOABS 0.5 12/11/2016 1213   EOSABS 0.2 11/27/2017 0907   EOSABS 0.2 12/11/2016 1213   BASOSABS 0.1 11/27/2017 0907   BASOSABS 0.0 12/11/2016 1213    BMET    Component Value Date/Time   NA 137 03/10/2017 1735   NA 139 08/29/2012 1102   K 3.0 (L) 03/10/2017 1735   K 3.2 (L) 08/29/2012 1102   CL 107 03/10/2017 1735   CL 105 08/29/2012 1102   CO2  23 03/10/2017 1735   CO2 23 08/29/2012 1102   GLUCOSE 107 (H) 03/10/2017 1735   GLUCOSE 130 (H) 08/29/2012 1102   BUN 11 03/10/2017 1735   BUN  5.8 (L) 08/29/2012 1102   CREATININE 0.57 03/10/2017 1735   CREATININE 0.8 08/29/2012 1102   CALCIUM 8.6 (L) 03/10/2017 1735   CALCIUM 8.6 08/29/2012 1102   GFRNONAA >60 03/10/2017 1735   GFRAA >60 03/10/2017 1735    BNP No results found for: BNP  ProBNP    Component Value Date/Time   PROBNP 6.1 01/06/2014 0800    Imaging: No results found.   Assessment & Plan:   No problem-specific Assessment & Plan notes found for this encounter.     Martyn Ehrich, NP 04/21/2018

## 2018-04-24 IMAGING — CT CT ABD-PELV W/ CM
2 of 5 series · 16 of 46 positions shown, 18 images · IV contrast (ISOVUE)
Comparison: 07/22/2015

CLINICAL DATA: Pt c/o intermittent upper abdominal pain with
nausea. Pt denies V/D/ constipation. Pt states 4 days ago she
noticed bright red blood with bowel movements^100mL [id number]
IOPAMIDOL ([id number]) INJECTION 61%

EXAM:
CT ABDOMEN AND PELVIS WITH CONTRAST
TECHNIQUE: Multidetector CT imaging of the abdomen and pelvis was performed
using the standard protocol following bolus administration of
intravenous contrast.
CONTRAST:  100mL [id number] IOPAMIDOL ([id number]) INJECTION 61%

[Series 2: abd/pel with · axial · 0.76mm/px · z∈[+710,+1120]mm · 13 of 94 slices shown, 15 images]
[im 6/94  soft-tissue]
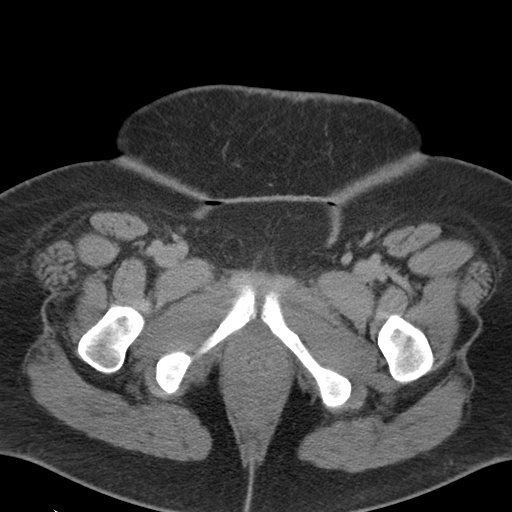
[im 6/94  bone]
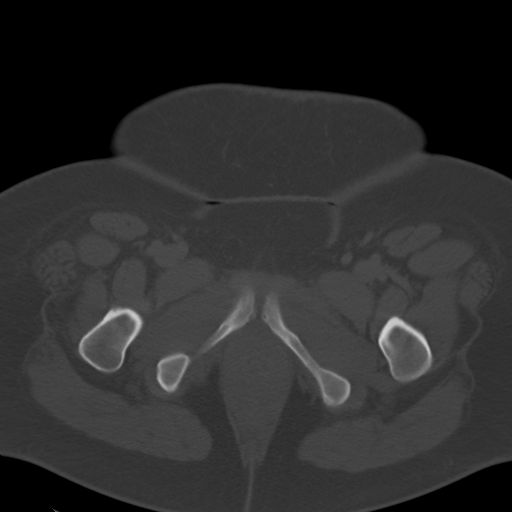
[im 12/94  soft-tissue]
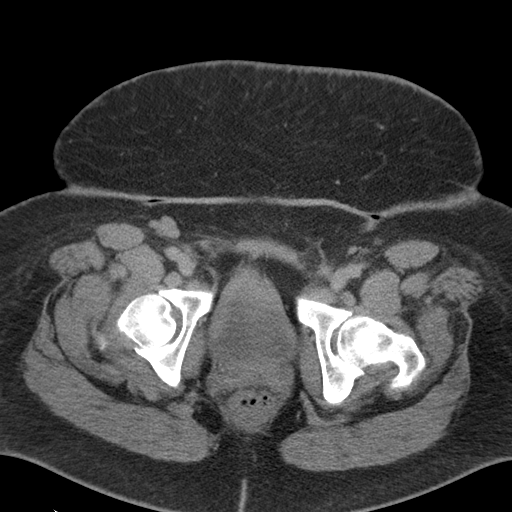
[im 18/94  soft-tissue]
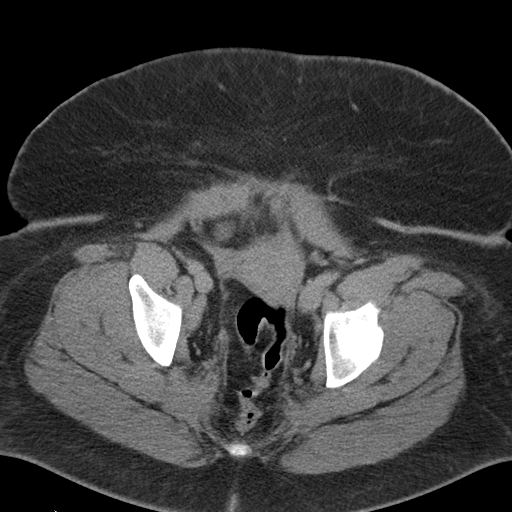
[im 30/94  soft-tissue]
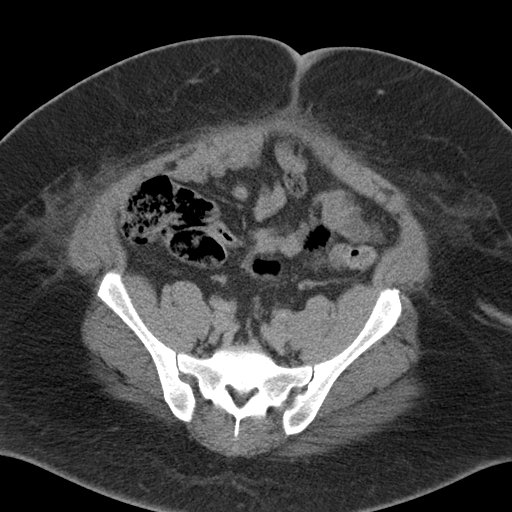
[im 35/94  soft-tissue]
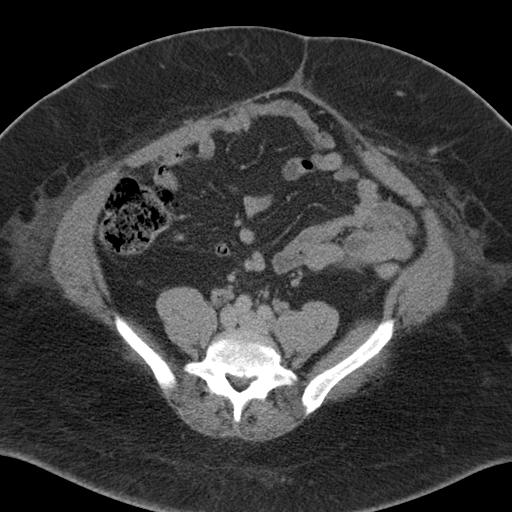
[im 41/94  soft-tissue]
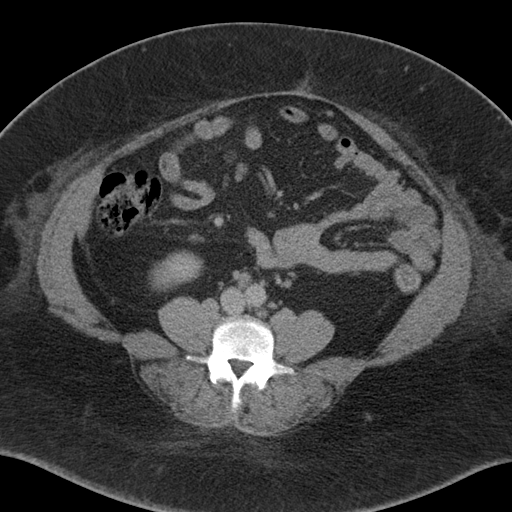
[im 47/94  soft-tissue]
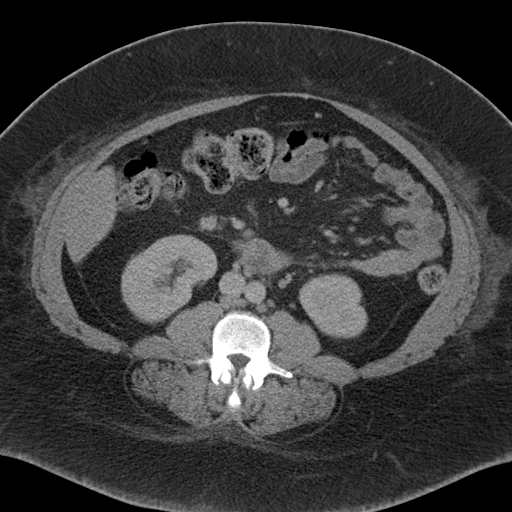
[im 53/94  soft-tissue]
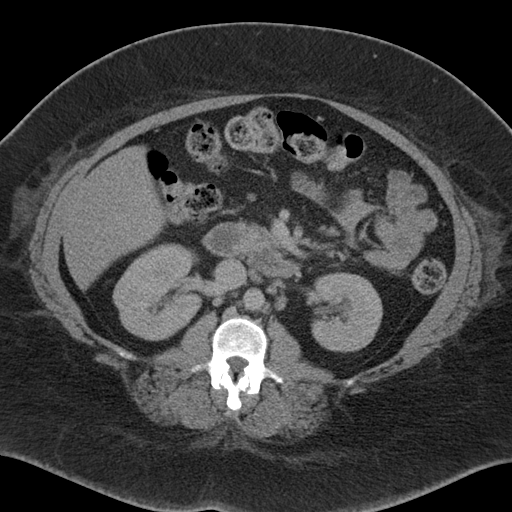
[im 59/94  soft-tissue]
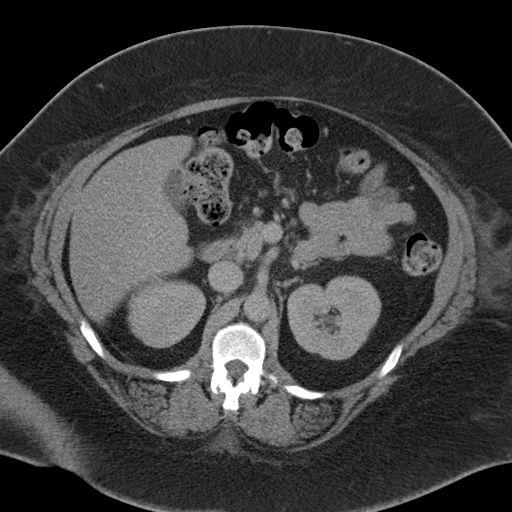
[im 59/94  bone]
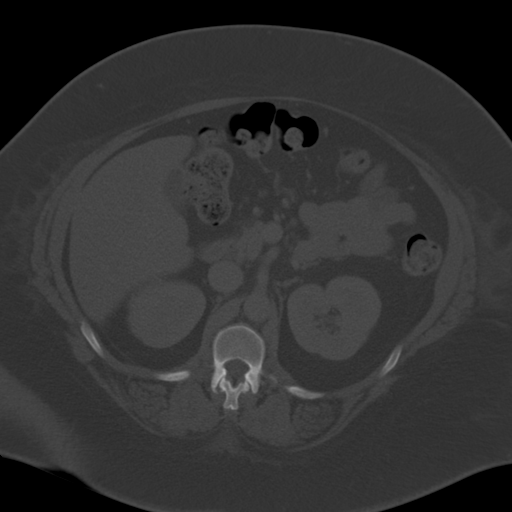
[im 64/94  soft-tissue]
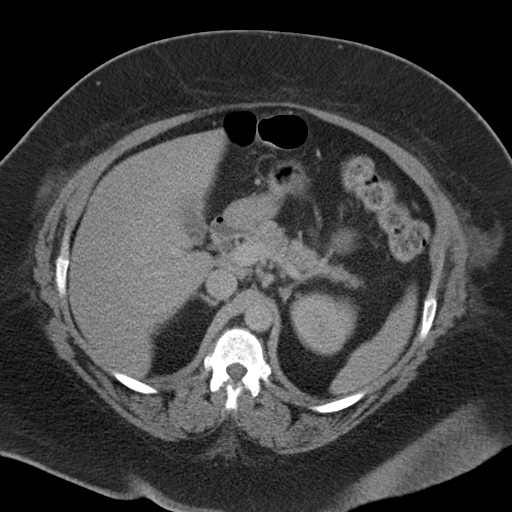
[im 76/94  soft-tissue]
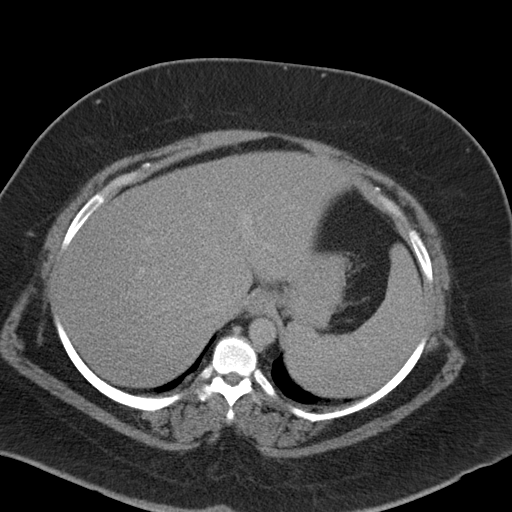
[im 82/94  soft-tissue]
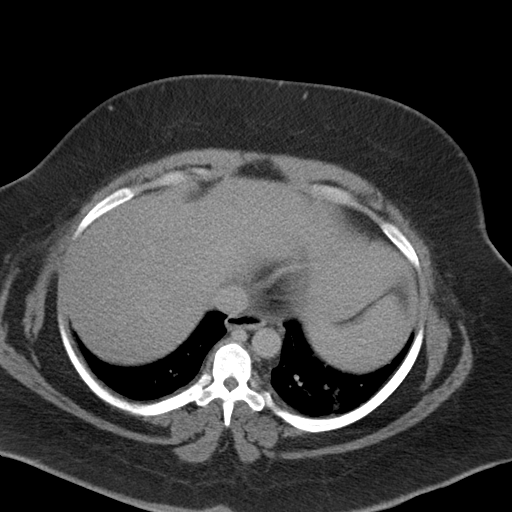
[im 88/94  soft-tissue]
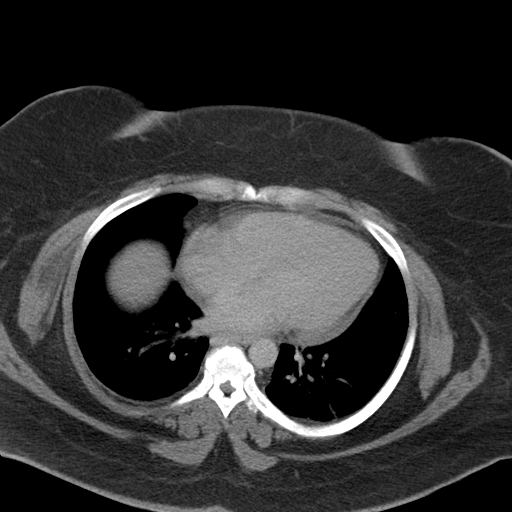

[Series 6: coronal a/|p · coronal · 0.78mm/px · 3 of 189 slices shown]
[im 63/189  soft-tissue]
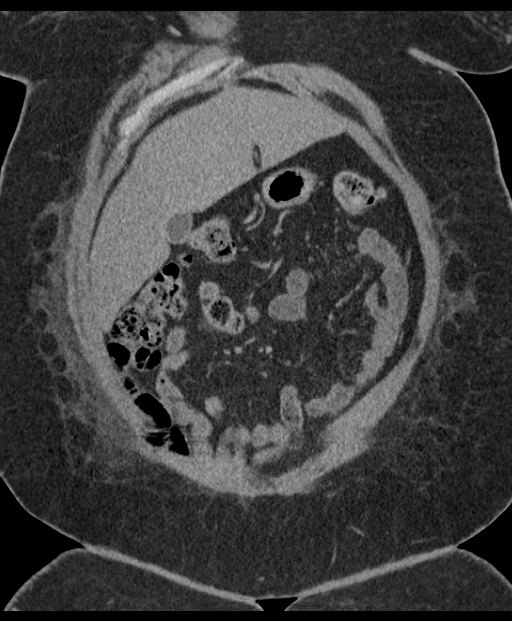
[im 84/189  soft-tissue]
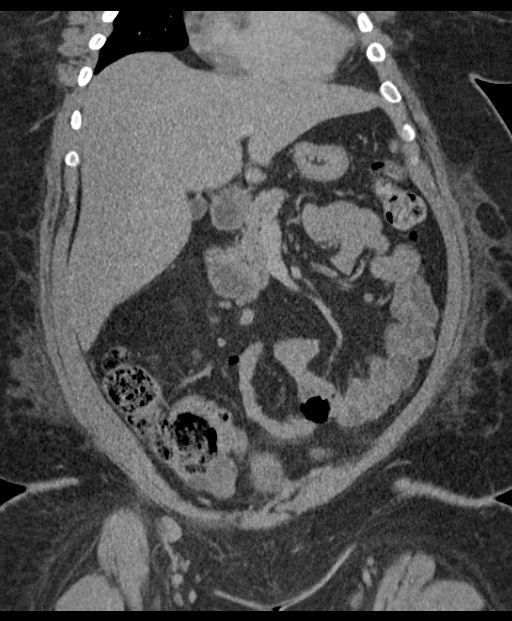
[im 105/189  soft-tissue]
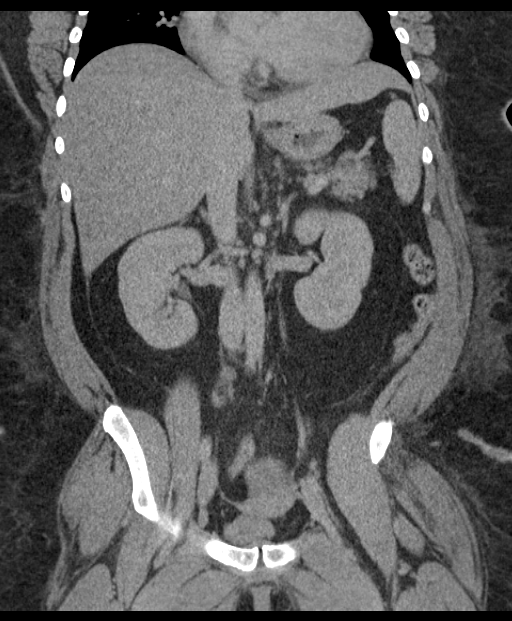

[16 of 46 positions shown; findings below may reference images not displayed]

FINDINGS: Lower chest: Focal areas of air trapping at the lung bases. Minimal
atelectasis at the left base. Heart size is normal. No imaged
pericardial effusion or significant coronary artery calcifications.

Hepatobiliary: Gallbladder is contracted and normal in appearance.
There is diffuse low-attenuation of the liver consistent with
hepatic steatosis. No focal liver lesions are identified.

Pancreas: Normal in appearance.

Spleen: Normal in appearance.

Renal/Adrenal: Normal adrenal glands. Symmetric enhancement and
excretion from both kidneys. No renal mass hydronephrosis.

Gastrointestinal tract: The stomach and small bowel loops are normal
in appearance. The appendix is well seen and has a normal
appearance. Colonic loops are normal in appearance. Normal
appearance of the rectum.

Reproductive/Pelvis: The uterus is present. Ovaries are normal in CT
appearance. There is no free pelvic fluid.

Vascular/Lymphatic: No retroperitoneal or mesenteric adenopathy. No
evidence for aortic aneurysm.

Musculoskeletal/Abdominal wall: Visualized osseous structures have a
normal appearance. Anterior abdominal wall is unremarkable in
appearance.

Other: none
IMPRESSION: 1.  No evidence for acute abdominal or pelvic abnormality.
2. Hepatic steatosis.
3. Normal appendix.

## 2018-07-22 ENCOUNTER — Other Ambulatory Visit: Payer: Self-pay | Admitting: Pulmonary Disease

## 2018-07-22 MED ORDER — ALBUTEROL SULFATE HFA 108 (90 BASE) MCG/ACT IN AERS: 2.0000 | INHALATION_SPRAY | Freq: Four times a day (QID) | RESPIRATORY_TRACT | 0 refills | Status: AC | PRN

## 2019-02-07 ENCOUNTER — Emergency Department (HOSPITAL_BASED_OUTPATIENT_CLINIC_OR_DEPARTMENT_OTHER)
Admission: EM | Admit: 2019-02-07 | Discharge: 2019-02-07 | Disposition: A | Discharge status: Home / Self Care | Payer: 59 | Attending: Emergency Medicine | Admitting: Emergency Medicine

## 2019-02-07 ENCOUNTER — Encounter (HOSPITAL_BASED_OUTPATIENT_CLINIC_OR_DEPARTMENT_OTHER): Payer: Self-pay | Admitting: Emergency Medicine

## 2019-02-07 ENCOUNTER — Other Ambulatory Visit: Payer: Self-pay

## 2019-02-07 DIAGNOSIS — I10 Essential (primary) hypertension: Secondary | ICD-10-CM | POA: Insufficient documentation

## 2019-02-07 DIAGNOSIS — Z91040 Latex allergy status: Secondary | ICD-10-CM | POA: Diagnosis not present

## 2019-02-07 DIAGNOSIS — Z8541 Personal history of malignant neoplasm of cervix uteri: Secondary | ICD-10-CM | POA: Insufficient documentation

## 2019-02-07 DIAGNOSIS — N390 Urinary tract infection, site not specified: Secondary | ICD-10-CM | POA: Diagnosis not present

## 2019-02-07 DIAGNOSIS — Z3202 Encounter for pregnancy test, result negative: Secondary | ICD-10-CM | POA: Insufficient documentation

## 2019-02-07 DIAGNOSIS — Z79899 Other long term (current) drug therapy: Secondary | ICD-10-CM | POA: Diagnosis not present

## 2019-02-07 DIAGNOSIS — J45909 Unspecified asthma, uncomplicated: Secondary | ICD-10-CM | POA: Diagnosis not present

## 2019-02-07 DIAGNOSIS — M545 Low back pain, unspecified: Secondary | ICD-10-CM

## 2019-02-07 DIAGNOSIS — E669 Obesity, unspecified: Secondary | ICD-10-CM | POA: Diagnosis not present

## 2019-02-07 LAB — URINALYSIS, ROUTINE W REFLEX MICROSCOPIC
Bilirubin Urine: NEGATIVE
Glucose, UA: NEGATIVE mg/dL
Ketones, ur: NEGATIVE mg/dL
Nitrite: NEGATIVE
Protein, ur: NEGATIVE mg/dL
Specific Gravity, Urine: 1.025 (ref 1.005–1.030)
pH: 6 (ref 5.0–8.0)

## 2019-02-07 LAB — PREGNANCY, URINE: Preg Test, Ur: NEGATIVE

## 2019-02-07 LAB — URINALYSIS, MICROSCOPIC (REFLEX): WBC, UA: >50 WBC/hpf (ref 0–5)

## 2019-02-07 LAB — CBG MONITORING, ED: Glucose-Capillary: 85 mg/dL (ref 70–99)

## 2019-02-07 MED ORDER — KETOROLAC TROMETHAMINE 30 MG/ML IJ SOLN
30.0000 mg | Freq: Once | INTRAMUSCULAR | Status: AC
  Administered 2019-02-07: 30 mg via INTRAMUSCULAR
  Filled 2019-02-07: qty 1

## 2019-02-07 MED ORDER — CEPHALEXIN 500 MG PO CAPS: 500.0000 mg | ORAL_CAPSULE | Freq: Four times a day (QID) | ORAL | 0 refills | Status: AC

## 2019-02-07 MED ORDER — DEXAMETHASONE SODIUM PHOSPHATE 10 MG/ML IJ SOLN
10.0000 mg | Freq: Once | INTRAMUSCULAR | Status: AC
  Administered 2019-02-07: 10 mg via INTRAMUSCULAR
  Filled 2019-02-07: qty 1

## 2019-02-07 MED ORDER — FLUCONAZOLE 150 MG PO TABS: 150.0000 mg | ORAL_TABLET | Freq: Once | ORAL | 0 refills | Status: AC

## 2019-02-07 MED ORDER — MELOXICAM 7.5 MG PO TABS: 15.0000 mg | ORAL_TABLET | Freq: Every day | ORAL | 0 refills | Status: AC

## 2019-02-07 MED ORDER — PHENAZOPYRIDINE HCL 200 MG PO TABS: 200.0000 mg | ORAL_TABLET | Freq: Three times a day (TID) | ORAL | 0 refills | Status: DC | PRN

## 2019-02-07 NOTE — ED Notes (Addendum)
Pt called out requesting pain medication.

## 2019-02-07 NOTE — ED Provider Notes (Signed)
[facility] EMERGENCY DEPARTMENT Provider Note   CSN: [id number] Arrival date & time: 02/07/19  0730     History   Chief Complaint Chief Complaint  Patient presents with  . Back Pain    HPI Cheyenne Arias is a 43 y.o. female.     43 year old female with extensive past medical history including spinal stenosis, chronic back pain on chronic opiates, fibromyalgia, hypertension, bipolar disorder who presents with back pain.  Patient has chronic back pain for which she follows with a specialist and is on 15 mg oxycodone QID.  She reports that this week her back pain has been worse despite taking all of her medications including pain medication and muscle relaxants.  Pain is worse when trying to stand from a seated position, is bilateral, and goes down both legs.  She reports that lately her pain has been involving her entire body including arms and legs.  She notes some urinary frequency and occasional accidents over the past 1 week.  She sometimes has tingling in fingers and toes b/l.  She is able to ambulate with no leg weakness.  No fevers or cough.  Currently her pain is severe.  The history is provided by the patient.  Back Pain   Past Medical History:  Diagnosis Date  . Allergy    seasonal  . Anemia    goes to [facility], ferahem  . Anxiety   . Arthritis    spinal stenosis  . Asthma   . Bipolar disorder ([place])   . Cancer [facility]) 2013   Cervical cancer  . Depression   . Fatty liver disease, nonalcoholic   . Fibromyalgia   . Food poisoning   . GERD (gastroesophageal reflux disease)   . 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 HSV infection   . Hypertension   . Lumbar radiculopathy   . Migraines   . Neuropathy   . Obesity   . OSA (obstructive sleep apnea)   . OSA on CPAP    uses it nightly  . Peripheral neuropathy    LLE - pins and needles  . Shortness of breath dyspnea    with exertion  . Spinal stenosis     Patient Active Problem List   Diagnosis  Date Noted  . 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 Hematochezia   . BV (bacterial vaginosis) 10/23/2015  . Pelvic pain in female 10/23/2015  . CAP (community acquired pneumonia) 07/25/2015  . Asthma, chronic 07/02/2015  . OSA (obstructive sleep apnea) 07/02/2015  . Hypokalemia 07/02/2015  . Sepsis due to pneumonia ([place]) 07/02/2015  . Leukocytosis 07/02/2015  . IDA (iron deficiency anemia) 07/02/2015  . Benign essential HTN 07/12/2011  . Fibromyalgia 07/26/2009    Past Surgical History:  Procedure Laterality Date  . CARPAL TUNNEL RELEASE Right   . [place], 2013   x 3  . CESAREAN SECTION  11/28/2011   Procedure: CESAREAN SECTION;  Surgeon: Woodroe Mode, MD;  Location: [facility];  Service: Obstetrics;  Laterality: N/A;  . COLONOSCOPY N/A 04/01/2017   Procedure: COLONOSCOPY;  Surgeon: Ladene Artist, MD;  Location: [facility];  Service: Endoscopy;  Laterality: N/A;  . COLPOSCOPY    . DILITATION & CURRETTAGE/HYSTROSCOPY WITH NOVASURE ABLATION N/A 02/13/2017   Procedure: DILATATION & CURETTAGE/HYSTEROSCOPY WITH NOVASURE ABLATION;  Surgeon: Ena Dawley, MD;  Location: [facility];  Service: Gynecology;  Laterality: N/A;  . FACET JOINT INJECTION     multiple injections  .  HYSTEROSCOPY W/D&C N/A 08/17/2015   Procedure: DILATATION AND CURETTAGE /HYSTEROSCOPY;  Surgeon: Ena Dawley, MD;  Location: [facility];  Service: Gynecology;  Laterality: N/A;  . MANDIBLE SURGERY  2008  . WISDOM TOOTH EXTRACTION       OB History    Gravida  3   Para  3   Term  3   Preterm      AB      Living  3     SAB      TAB      Ectopic      Multiple      Live Births  3            Home Medications    Prior to Admission medications   Medication Sig Start Date End Date Taking? Authorizing Provider  albuterol (PROAIR HFA) 108 (90 Base) MCG/ACT inhaler Inhale 2 puffs into the lungs every 6 (six) hours as needed for wheezing or shortness of breath. 07/22/18   Chesley Mires, MD  amLODipine (NORVASC) 10 MG tablet Take 10 mg by mouth daily.    [provider]  ARIPiprazole (ABILIFY) 2 MG tablet Take 1 tablet (2 mg total) by mouth at bedtime. 05/08/17   Merian Capron, MD  budesonide (PULMICORT) 0.5 MG/2ML nebulizer solution TAKE 2 MLS BY NEBULIZATION 2 TIMES DAILY. DX CODE: CHRONIC ASTHMA J45.909 03/05/17   Magdalen Spatz, NP  cephALEXin (KEFLEX) 500 MG capsule Take 1 capsule (500 mg total) by mouth 4 (four) times daily for 10 days. 02/07/19 02/17/19  Raiyah Speakman, Wenda Overland, MD  clonazePAM (KLONOPIN) 0.5 MG tablet TAKE 1 TABLET TWICE A DAY AS NEEDED FOR ANXIETY 07/12/17   Merian Capron, MD  cyclobenzaprine (FLEXERIL) 10 MG tablet Take 10 mg by mouth 3 (three) times daily.     [provider]  ferumoxytol Shirlean Kelly) 510 MG/17ML SOLN injection Inject 510 mg into the vein every 3 (three) months.     [provider]  fluconazole (DIFLUCAN) 150 MG tablet Take 1 tablet (150 mg total) by mouth once for 1 dose. As needed for yeast infection symptoms 02/07/19 02/07/19  Barbarann Kelly, Wenda Overland, MD  FLUoxetine (PROZAC) 20 MG capsule Take 1 capsule (20 mg total) by mouth daily. 05/08/17   Merian Capron, MD  fluticasone (FLONASE) 50 MCG/ACT nasal spray PLACE 2 SPRAYS INTO BOTH NOSTRILS 2 (TWO) TIMES DAILY. 04/22/17   Chesley Mires, MD  furosemide (LASIX) 80 MG tablet Take 80 mg by mouth 2 (two) times daily.    [provider]  ibuprofen (ADVIL,MOTRIN) 800 MG tablet Take 800 mg by mouth 3 (three) times daily as needed for moderate pain.    [provider]  ipratropium-albuterol (DUONEB) 0.5-2.5 (3) MG/3ML SOLN TAKE 3 MLS BY NEBULIZATION EVERY 4 HOURS AS NEEDED (FOR WHEEZING/SHORTNESS OF BREATH). 03/05/17   Magdalen Spatz, NP  loratadine (CLARITIN) 10 MG tablet TAKE 10 MG BY MOUTH at night 04/22/17   Chesley Mires, MD  meloxicam (MOBIC) 7.5 MG tablet Take 2 tablets (15 mg total) by mouth daily for 7 days. 02/07/19 02/14/19  Kalista Laguardia, Wenda Overland,  MD  metoprolol tartrate (LOPRESSOR) 25 MG tablet Take 25 mg by mouth 2 (two) times daily.     [provider]  montelukast (SINGULAIR) 10 MG tablet TAKE 10 MG BY MOUTH AT BEDTIME 01/08/18   Chesley Mires, MD  Multiple Vitamins-Minerals (MULTIVITAMIN WITH MINERALS) tablet Take 1 tablet by mouth daily.    [provider]  ondansetron (ZOFRAN) 4 MG tablet  Take 1 tablet (4 mg total) by mouth every 8 (eight) hours as needed for nausea or vomiting. 02/13/17   Ena Dawley, MD  oxyCODONE (ROXICODONE) 15 MG immediate release tablet Take 15 mg by mouth every 6 (six) hours as needed for pain.    [provider]  OXYGEN Inhale 2 L into the lungs at bedtime.    [provider]  pantoprazole (PROTONIX) 40 MG tablet Take 40 mg by mouth at night 09/06/17   Chesley Mires, MD  phenazopyridine (PYRIDIUM) 200 MG tablet Take 1 tablet (200 mg total) by mouth 3 (three) times daily as needed for pain. 02/07/19   Dalayla Aldredge, Wenda Overland, MD  potassium chloride SA (K-DUR,KLOR-CON) 20 MEQ tablet Take 1 tablet (20 mEq total) by mouth daily. Patient taking differently: Take 20 mEq by mouth 2 (two) times daily.  08/06/16   Tresea Mall, CNM  pregabalin (LYRICA) 300 MG capsule Take 300 mg by mouth 2 (two) times daily.     [provider]  rizatriptan (MAXALT) 10 MG tablet Take 10 mg by mouth as needed for migraine. May repeat in 2 hours if needed    [provider]  topiramate (TOPAMAX) 100 MG tablet Take 100 mg by mouth 2 (two) times daily.     [provider]  triamcinolone cream (KENALOG) 0.1 % Apply 1 application topically daily as needed (for rash).  07/23/16   [provider]  zolpidem (AMBIEN) 5 MG tablet Take 5 mg by mouth at bedtime as needed for sleep.   05/10/14  [provider]    Family History Family History  Problem Relation Age of Onset  . Hypertension Mother   . Sickle cell trait Maternal Aunt   . Sickle cell anemia Other   .  Schizophrenia Son   . Depression Daughter   . Kidney disease Father        transplant required  . Stomach cancer Neg Hx   . Colon cancer Neg Hx     Social History Social History   Tobacco Use  . Smoking status: Never Smoker  . Smokeless tobacco: Never Used  . Tobacco comment: Pt was exposed to 2nd hand smoke at work years ago.  Substance Use Topics  . Alcohol use: No  . Drug use: No     Allergies   Cymbalta [duloxetine hcl], Latex, and Morphine and related   Review of Systems Review of Systems  Musculoskeletal: Positive for back pain.   All other systems reviewed and are negative except that which was mentioned in HPI   Physical Exam Updated Vital Signs BP (!) 164/90 (BP Location: Right Wrist)   Pulse 95   Temp 98 F (36.7 C) (Oral)   Resp 16   SpO2 100%   Physical Exam Vitals signs and nursing note reviewed.  Constitutional:      General: She is not in acute distress.    Appearance: She is well-developed. She is obese.     Comments: Uncomfortable, laying on side in bed  HENT:     Head: Normocephalic and atraumatic.  Eyes:     Conjunctiva/sclera: Conjunctivae normal.  Neck:     Musculoskeletal: Neck supple.  Skin:    General: Skin is warm and dry.  Neurological:     Mental Status: She is alert and oriented to person, place, and time.     Sensory: No sensory deficit.     Motor: No weakness.     Gait: Gait normal.  Comments: 5/5 strength BLE  Psychiatric:        Judgment: Judgment normal.      ED Treatments / Results  Labs (all labs ordered are listed, but only abnormal results are displayed) Labs Reviewed  URINALYSIS, ROUTINE W REFLEX MICROSCOPIC - Abnormal; Notable for the following components:      Result Value   APPearance CLOUDY (*)    Hgb urine dipstick TRACE (*)    Leukocytes,Ua SMALL (*)    All other components within normal limits  URINALYSIS, MICROSCOPIC (REFLEX) - Abnormal; Notable for the following components:   Bacteria, UA  MANY (*)    All other components within normal limits  URINE CULTURE  PREGNANCY, URINE  CBG MONITORING, ED    EKG None  Radiology No results found.  Procedures Procedures (including critical care time)  Medications Ordered in ED Medications  ketorolac (TORADOL) 30 MG/ML injection 30 mg (30 mg Intramuscular Given 02/07/19 0845)  dexamethasone (DECADRON) injection 10 mg (10 mg Intramuscular Given 02/07/19 0846)     Initial Impression / Assessment and Plan / ED Course  I have reviewed the triage vital signs and the nursing notes.  Pertinent labs  that were available during my care of the patient were reviewed by me and considered in my medical decision making (see chart for details).       Pt reports acute flare of chronic back pain for which she has regularly scheduled injections.  No fevers or immediate problems after injection to suggest infection.  No signs or symptoms of cauda equina on today's exam.  She did report some urinary accidents but is not retaining urine, voiding freely here.  Her urine has large amount of WBCs and bacteria, I suspect infection is the cause of her urinary symptoms rather than acute spinal process.  She reports history of UTIs.  Gave Toradol and Decadron here, discharged with antibiotics to treat pyelo in the event that this is exacerbating back pain. Also gave Pyridium and diflucan per her request.  Ducted to follow-up with PCP next week for recheck and reviewed return precautions.  She voiced understanding.  Final Clinical Impressions(s) / ED Diagnoses   Final diagnoses:  Urinary tract infection without hematuria, site unspecified  Bilateral low back pain, unspecified chronicity, unspecified whether sciatica present    ED Discharge Orders         Ordered    cephALEXin (KEFLEX) 500 MG capsule  4 times daily     02/07/19 0903    phenazopyridine (PYRIDIUM) 200 MG tablet  3 times daily PRN     02/07/19 0903    fluconazole (DIFLUCAN) 150 MG  tablet   Once     02/07/19 0903    meloxicam (MOBIC) 7.5 MG tablet  Daily     02/07/19 0908           Lariya Kinzie, Wenda Overland, MD 02/07/19 [phone]

## 2019-02-07 NOTE — ED Triage Notes (Signed)
Pt here with acute exacerbation of chronic back pain. She states she has stenosis and that she takes 15mg  oxycodone 4x a day and it is not working right now.

## 2019-02-10 LAB — URINE CULTURE: Culture: >=100000 — AB

## 2019-02-11 ENCOUNTER — Telehealth: Payer: Self-pay | Admitting: *Deleted

## 2019-02-11 NOTE — Telephone Encounter (Signed)
Post ED Visit - Positive Culture Follow-up  Culture report reviewed by antimicrobial stewardship pharmacist: [place] Team []  Elenor Quinones, Pharm.D. []  Heide Guile, Pharm.D., BCPS AQ-ID []  Parks Neptune, Pharm.D., BCPS []  Alycia Rossetti, Pharm.D., BCPS []  [place], Pharm.D., BCPS, AAHIVP []  Legrand Como, Pharm.D., BCPS, AAHIVP [x]  Salome Arnt, PharmD, BCPS []  Johnnette Gourd, PharmD, BCPS []  Hughes Better, PharmD, BCPS []  Leeroy Cha, PharmD []  Laqueta Linden, PharmD, BCPS []  Albertina Parr, PharmD  [place] Team []  Leodis Sias, PharmD []  Lindell Spar, PharmD []  Royetta Asal, PharmD []  Graylin Shiver, Rph []  Rema Fendt) Glennon Mac, PharmD []  Arlyn Dunning, PharmD []  Netta Cedars, PharmD []  Dia Sitter, PharmD []  Leone Haven, PharmD []  Gretta Arab, PharmD []  Theodis Shove, PharmD []  Peggyann Juba, PharmD []  Reuel Boom, PharmD   Positive urine culture Treated with Cephalexin, organism sensitive to the same and no further patient follow-up is required at this time.  Harlon Flor [facility] 02/11/2019, 11:40 AM

## 2019-04-17 ENCOUNTER — Encounter (HOSPITAL_BASED_OUTPATIENT_CLINIC_OR_DEPARTMENT_OTHER): Payer: Self-pay | Admitting: Emergency Medicine

## 2019-04-17 ENCOUNTER — Other Ambulatory Visit: Payer: Self-pay

## 2019-04-17 ENCOUNTER — Emergency Department (HOSPITAL_BASED_OUTPATIENT_CLINIC_OR_DEPARTMENT_OTHER)
Admission: EM | Admit: 2019-04-17 | Discharge: 2019-04-17 | Disposition: A | Discharge status: Home / Self Care | Payer: 59 | Attending: Emergency Medicine | Admitting: Emergency Medicine

## 2019-04-17 DIAGNOSIS — I10 Essential (primary) hypertension: Secondary | ICD-10-CM | POA: Diagnosis not present

## 2019-04-17 DIAGNOSIS — Z91040 Latex allergy status: Secondary | ICD-10-CM | POA: Diagnosis not present

## 2019-04-17 DIAGNOSIS — Z8541 Personal history of malignant neoplasm of cervix uteri: Secondary | ICD-10-CM | POA: Diagnosis not present

## 2019-04-17 DIAGNOSIS — J020 Streptococcal pharyngitis: Secondary | ICD-10-CM | POA: Diagnosis not present

## 2019-04-17 DIAGNOSIS — Z79899 Other long term (current) drug therapy: Secondary | ICD-10-CM | POA: Diagnosis not present

## 2019-04-17 DIAGNOSIS — J45909 Unspecified asthma, uncomplicated: Secondary | ICD-10-CM | POA: Insufficient documentation

## 2019-04-17 DIAGNOSIS — Z20822 Contact with and (suspected) exposure to covid-19: Secondary | ICD-10-CM | POA: Diagnosis not present

## 2019-04-17 DIAGNOSIS — R070 Pain in throat: Secondary | ICD-10-CM | POA: Diagnosis present

## 2019-04-17 HISTORY — DX: Malignant neoplasm of cervix uteri, unspecified: C53.9

## 2019-04-17 LAB — GROUP A STREP BY PCR: Group A Strep by PCR: DETECTED — AB

## 2019-04-17 LAB — SARS CORONAVIRUS 2 AG (30 MIN TAT): SARS Coronavirus 2 Ag: NEGATIVE

## 2019-04-17 MED ORDER — PENICILLIN G BENZATHINE 1200000 UNIT/2ML IM SUSP
1.2000 10*6.[IU] | Freq: Once | INTRAMUSCULAR | Status: AC
  Administered 2019-04-17: 1.2 10*6.[IU] via INTRAMUSCULAR
  Filled 2019-04-17: qty 2

## 2019-04-17 MED ORDER — ALBUTEROL SULFATE HFA 108 (90 BASE) MCG/ACT IN AERS
2.0000 | INHALATION_SPRAY | RESPIRATORY_TRACT | Status: DC | PRN
  Administered 2019-04-17: 2 via RESPIRATORY_TRACT
  Filled 2019-04-17: qty 6.7

## 2019-04-17 MED ORDER — DEXAMETHASONE 6 MG PO TABS
10.0000 mg | ORAL_TABLET | Freq: Once | ORAL | Status: AC
  Administered 2019-04-17: 10 mg via ORAL
  Filled 2019-04-17: qty 1

## 2019-04-17 NOTE — ED Provider Notes (Signed)
[facility] Provider Note: Cheyenne Spurling, MD, FACEP  CSN: [id number] MRN: [id number] ARRIVAL: 04/17/19 at 0534 ROOM: [place]  Flu-Like Symptoms   HISTORY OF PRESENT ILLNESS  04/17/19 6:21 AM Cheyenne Arias is a 44 y.o. female complains of several days of sore throat with left ear pain, shortness of breath, nasal congestion and chills.  This morning she also feels congested and has been having wheezing.  She has a history of asthma but is out of her inhaler and feels like she needs a breathing treatment.  She rates her sore throat is an 8 out of 10, worse with swallowing.   Past Medical History:  Diagnosis Date  . Allergy    seasonal  . Anemia    goes to [facility], ferahem  . Anxiety   . Arthritis    spinal stenosis  . Asthma   . Bipolar disorder ([place])   . Cervical cancer ([place])   . Depression   . Fatty liver disease, nonalcoholic   . Fibromyalgia   . Food poisoning   . GERD (gastroesophageal reflux disease)   . 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 HSV infection   . Hypertension   . Lumbar radiculopathy   . Migraines   . Neuropathy   . Obesity   . OSA (obstructive sleep apnea)   . OSA on CPAP    uses it nightly  . Peripheral neuropathy    LLE - pins and needles  . Shortness of breath dyspnea    with exertion  . Spinal stenosis     Past Surgical History:  Procedure Laterality Date  . CARPAL TUNNEL RELEASE Right   . [place], 2013   x 3  . CESAREAN SECTION  11/28/2011   Procedure: CESAREAN SECTION;  Surgeon: Woodroe Mode, MD;  Location: [facility];  Service: Obstetrics;  Laterality: N/A;  . COLONOSCOPY N/A 04/01/2017   Procedure: COLONOSCOPY;  Surgeon: Ladene Artist, MD;  Location: [facility];  Service: Endoscopy;  Laterality: N/A;  . COLPOSCOPY    . DILITATION & CURRETTAGE/HYSTROSCOPY WITH NOVASURE ABLATION N/A 02/13/2017   Procedure: DILATATION & CURETTAGE/HYSTEROSCOPY WITH NOVASURE ABLATION;   Surgeon: Ena Dawley, MD;  Location: [facility];  Service: Gynecology;  Laterality: N/A;  . FACET JOINT INJECTION     multiple injections  . HYSTEROSCOPY WITH D & C N/A 08/17/2015   Procedure: DILATATION AND CURETTAGE /HYSTEROSCOPY;  Surgeon: Ena Dawley, MD;  Location: [facility];  Service: Gynecology;  Laterality: N/A;  . MANDIBLE SURGERY  2008  . WISDOM TOOTH EXTRACTION      Family History  Problem Relation Age of Onset  . Hypertension Mother   . Sickle cell trait Maternal Aunt   . Sickle cell anemia Other   . Schizophrenia Son   . Depression Daughter   . Kidney disease Father        transplant required  . Stomach cancer Neg Hx   . Colon cancer Neg Hx     Social History   Tobacco Use  . Smoking status: Never Smoker  . Smokeless tobacco: Never Used  . Tobacco comment: Pt was exposed to 2nd hand smoke at work years ago.  Substance Use Topics  . Alcohol use: No  . Drug use: No    Prior to Admission medications   Medication Sig Start Date End Date Taking? Authorizing Provider  albuterol (PROAIR HFA) 108 (90 Base) MCG/ACT inhaler Inhale 2  puffs into the lungs every 6 (six) hours as needed for wheezing or shortness of breath. 07/22/18   Chesley Mires, MD  amLODipine (NORVASC) 10 MG tablet Take 10 mg by mouth daily.    [provider]  ARIPiprazole (ABILIFY) 2 MG tablet Take 1 tablet (2 mg total) by mouth at bedtime. 05/08/17   Merian Capron, MD  budesonide (PULMICORT) 0.5 MG/2ML nebulizer solution TAKE 2 MLS BY NEBULIZATION 2 TIMES DAILY. DX CODE: CHRONIC ASTHMA J45.909 03/05/17   Magdalen Spatz, NP  clonazePAM (KLONOPIN) 0.5 MG tablet TAKE 1 TABLET TWICE A DAY AS NEEDED FOR ANXIETY 07/12/17   Merian Capron, MD  cyclobenzaprine (FLEXERIL) 10 MG tablet Take 10 mg by mouth 3 (three) times daily.     [provider]  ferumoxytol Shirlean Kelly) 510 MG/17ML SOLN injection Inject 510 mg into the vein every 3 (three) months.     [provider]  FLUoxetine (PROZAC)  20 MG capsule Take 1 capsule (20 mg total) by mouth daily. 05/08/17   Merian Capron, MD  fluticasone (FLONASE) 50 MCG/ACT nasal spray PLACE 2 SPRAYS INTO BOTH NOSTRILS 2 (TWO) TIMES DAILY. 04/22/17   Chesley Mires, MD  furosemide (LASIX) 80 MG tablet Take 80 mg by mouth 2 (two) times daily.    [provider]  ibuprofen (ADVIL,MOTRIN) 800 MG tablet Take 800 mg by mouth 3 (three) times daily as needed for moderate pain.    [provider]  ipratropium-albuterol (DUONEB) 0.5-2.5 (3) MG/3ML SOLN TAKE 3 MLS BY NEBULIZATION EVERY 4 HOURS AS NEEDED (FOR WHEEZING/SHORTNESS OF BREATH). 03/05/17   Magdalen Spatz, NP  loratadine (CLARITIN) 10 MG tablet TAKE 10 MG BY MOUTH at night 04/22/17   Chesley Mires, MD  metoprolol tartrate (LOPRESSOR) 25 MG tablet Take 25 mg by mouth 2 (two) times daily.     [provider]  montelukast (SINGULAIR) 10 MG tablet TAKE 10 MG BY MOUTH AT BEDTIME 01/08/18   Chesley Mires, MD  Multiple Vitamins-Minerals (MULTIVITAMIN WITH MINERALS) tablet Take 1 tablet by mouth daily.    [provider]  ondansetron (ZOFRAN) 4 MG tablet Take 1 tablet (4 mg total) by mouth every 8 (eight) hours as needed for nausea or vomiting. 02/13/17   Ena Dawley, MD  oxyCODONE (ROXICODONE) 15 MG immediate release tablet Take 15 mg by mouth every 6 (six) hours as needed for pain.    [provider]  OXYGEN Inhale 2 L into the lungs at bedtime.    [provider]  pantoprazole (PROTONIX) 40 MG tablet Take 40 mg by mouth at night 09/06/17   Chesley Mires, MD  phenazopyridine (PYRIDIUM) 200 MG tablet Take 1 tablet (200 mg total) by mouth 3 (three) times daily as needed for pain. 02/07/19   Little, Wenda Overland, MD  potassium chloride SA (K-DUR,KLOR-CON) 20 MEQ tablet Take 1 tablet (20 mEq total) by mouth daily. Patient taking differently: Take 20 mEq by mouth 2 (two) times daily.  08/06/16   Tresea Mall, CNM  pregabalin (LYRICA) 300 MG capsule Take 300 mg  by mouth 2 (two) times daily.     [provider]  rizatriptan (MAXALT) 10 MG tablet Take 10 mg by mouth as needed for migraine. May repeat in 2 hours if needed    [provider]  topiramate (TOPAMAX) 100 MG tablet Take 100 mg by mouth 2 (two) times daily.     [provider]  triamcinolone cream (KENALOG) 0.1 % Apply 1 application topically daily as  needed (for rash).  07/23/16   [provider]  zolpidem (AMBIEN) 5 MG tablet Take 5 mg by mouth at bedtime as needed for sleep.   05/10/14  [provider]    Allergies Cymbalta [duloxetine hcl], Latex, and Morphine and related   REVIEW OF SYSTEMS  Negative except as noted here or in the History of Present Illness.   PHYSICAL EXAMINATION  Initial Vital Signs Blood pressure 134/74, pulse 100, temperature 99.1 F (37.3 C), temperature source Oral, resp. rate (!) 22, height 5\' 2"  (1.575 m), weight 129.3 kg, SpO2 97 %.  Examination General: Well-developed, morbidly obese female in no acute distress; appearance consistent with age of record HENT: normocephalic; atraumatic; TMs normal; tonsillar enlargement, erythema and exudate; uvula midline Eyes: pupils equal, round and reactive to light; extraocular muscles intact Neck: supple; anterior cervical lymphadenopathy Heart: regular rate and rhythm Lungs: clear to auscultation bilaterally Abdomen: soft; nondistended; nontender; bowel sounds present Extremities: No deformity; full range of motion; pulses normal Neurologic: Awake, alert and oriented; motor function intact in all extremities and symmetric; no facial droop Skin: Warm and dry Psychiatric: Flat affect   RESULTS  Summary of this visit's results, reviewed and interpreted by myself:   EKG Interpretation  Date/Time:    Ventricular Rate:    PR Interval:    QRS Duration:   QT Interval:    QTC Calculation:   R Axis:     Text Interpretation:        Laboratory Studies: Results for  orders placed or performed during the hospital encounter of 04/17/19 (from the past 24 hour(s))  Group A Strep by PCR     Status: Abnormal   Collection Time: 04/17/19  6:35 AM   Specimen: Throat; Sterile Swab  Result Value Ref Range   Group A Strep by PCR DETECTED (A) NOT DETECTED   Imaging Studies: No results found.  ED COURSE and MDM  Nursing notes, initial and subsequent vitals signs, including pulse oximetry, reviewed and interpreted by myself.  Vitals:   04/17/19 0600  BP: 134/74  Pulse: 100  Resp: (!) 22  Temp: 99.1 F (37.3 C)  TempSrc: Oral  SpO2: 97%  Weight: 129.3 kg  Height: 5\' 2"  (1.575 m)   Medications  albuterol (VENTOLIN HFA) 108 (90 Base) MCG/ACT inhaler 2 puff (2 puffs Inhalation Given 04/17/19 0654)  penicillin g benzathine (BICILLIN LA) 1200000 UNIT/2ML injection 1.2 Million Units (has no administration in time range)  dexamethasone (DECADRON) tablet 10 mg (has no administration in time range)   Patient positive for strep throat.  Will give Bicillin and dexamethasone.  Covid test pending.  Cheyenne Arias was evaluated in Emergency Department on 04/17/2019 for the symptoms described in the history of present illness. She was evaluated in the context of the global COVID-19 pandemic, which necessitated consideration that the patient might be at risk for infection with the SARS-CoV-2 virus that causes COVID-19. Institutional protocols and algorithms that pertain to the evaluation of patients at risk for COVID-19 are in a state of rapid change based on information released by regulatory bodies including the CDC and federal and state organizations. These policies and algorithms were followed during the patient's care in the ED.   PROCEDURES  Procedures   ED DIAGNOSES     ICD-10-CM   1. Strep pharyngitis  J02.0        Cheyenne Megill, MD 04/17/19 [phone]

## 2019-04-17 NOTE — ED Triage Notes (Addendum)
C/o left ear pain, throat, feeling sob, and chills. States this started a couple days ago. States she is feeling dizzy this morning and congested. No resp distress on arrival. States she feels like she needs a breathing treatment. History of asthma and audible faint wheezing noted.

## 2019-04-18 LAB — NOVEL CORONAVIRUS, NAA (HOSP ORDER, SEND-OUT TO REF LAB; TAT 18-24 HRS): SARS-CoV-2, NAA: NOT DETECTED

## 2019-04-29 ENCOUNTER — Telehealth: Payer: Self-pay

## 2019-04-29 NOTE — Telephone Encounter (Signed)
She called and left a message. She was told by PCP and rheumatologist that her hemoglobin and iron is low/ and that she needs a iron infusion. She showed up out front to make appt but the staff was too rude to assist.

## 2019-04-29 NOTE — Telephone Encounter (Signed)
From care everywhere, her hemoglobin is normal She is not anemic and does not need IV iron I suggest rechecking her labs and iron studies here in March and see me and IV iron all same day If she agrees, let me know and I will order and send scheduling msg

## 2019-04-29 NOTE — Telephone Encounter (Signed)
Called and given below message. She verbalized understanding and is agreeable to March appts.

## 2019-04-30 ENCOUNTER — Other Ambulatory Visit: Payer: Self-pay | Admitting: Hematology and Oncology

## 2019-04-30 ENCOUNTER — Telehealth: Payer: Self-pay | Admitting: Hematology and Oncology

## 2019-04-30 DIAGNOSIS — D509 Iron deficiency anemia, unspecified: Secondary | ICD-10-CM

## 2019-04-30 NOTE — Telephone Encounter (Signed)
I sent scheduling msg 

## 2019-04-30 NOTE — Telephone Encounter (Signed)
Scheduled per 1/14 sch msg. Called pt no answer, unable to leave msg. Mailing printout 

## 2019-05-01 ENCOUNTER — Other Ambulatory Visit: Payer: Self-pay

## 2019-06-19 ENCOUNTER — Ambulatory Visit: Payer: Medicaid Other | Admitting: Hematology and Oncology

## 2019-06-19 ENCOUNTER — Other Ambulatory Visit: Payer: Medicaid Other

## 2019-06-19 ENCOUNTER — Telehealth: Payer: Self-pay

## 2019-06-19 ENCOUNTER — Ambulatory Visit: Payer: Medicaid Other

## 2019-06-19 ENCOUNTER — Telehealth: Payer: Self-pay | Admitting: Hematology and Oncology

## 2019-06-19 NOTE — Telephone Encounter (Signed)
Called regarding today's missed appts. She forgot/ did not know she had appts today. She can come on 3/12 and 3/18.

## 2019-06-19 NOTE — Telephone Encounter (Signed)
Scheduled appt per 3/5 sch message - unable to reach pt . Left message with appt date and time

## 2019-06-26 ENCOUNTER — Telehealth: Payer: Self-pay | Admitting: Hematology and Oncology

## 2019-06-26 ENCOUNTER — Other Ambulatory Visit: Payer: Self-pay | Admitting: Hematology and Oncology

## 2019-06-26 ENCOUNTER — Encounter: Payer: Self-pay | Admitting: Hematology and Oncology

## 2019-06-26 ENCOUNTER — Inpatient Hospital Stay: Payer: 59 | Attending: Hematology and Oncology | Admitting: Hematology and Oncology

## 2019-06-26 ENCOUNTER — Other Ambulatory Visit: Payer: Self-pay

## 2019-06-26 ENCOUNTER — Inpatient Hospital Stay: Payer: 59

## 2019-06-26 DIAGNOSIS — M797 Fibromyalgia: Secondary | ICD-10-CM | POA: Insufficient documentation

## 2019-06-26 DIAGNOSIS — D72829 Elevated white blood cell count, unspecified: Secondary | ICD-10-CM | POA: Diagnosis not present

## 2019-06-26 DIAGNOSIS — D72825 Bandemia: Secondary | ICD-10-CM

## 2019-06-26 DIAGNOSIS — D509 Iron deficiency anemia, unspecified: Secondary | ICD-10-CM | POA: Diagnosis present

## 2019-06-26 DIAGNOSIS — Z79899 Other long term (current) drug therapy: Secondary | ICD-10-CM | POA: Insufficient documentation

## 2019-06-26 DIAGNOSIS — G4733 Obstructive sleep apnea (adult) (pediatric): Secondary | ICD-10-CM | POA: Insufficient documentation

## 2019-06-26 DIAGNOSIS — Z791 Long term (current) use of non-steroidal anti-inflammatories (NSAID): Secondary | ICD-10-CM | POA: Diagnosis not present

## 2019-06-26 DIAGNOSIS — Z7951 Long term (current) use of inhaled steroids: Secondary | ICD-10-CM | POA: Diagnosis not present

## 2019-06-26 LAB — CBC WITH DIFFERENTIAL/PLATELET
Abs Immature Granulocytes: 0.13 10*3/uL — ABNORMAL HIGH (ref 0.00–0.07)
Basophils Absolute: 0.1 10*3/uL (ref 0.0–0.1)
Basophils Relative: 1 %
Eosinophils Absolute: 0.4 10*3/uL (ref 0.0–0.5)
Eosinophils Relative: 3 %
HCT: 40.8 % (ref 36.0–46.0)
Hemoglobin: 12.4 g/dL (ref 12.0–15.0)
Immature Granulocytes: 1 %
Lymphocytes Relative: 17 %
Lymphs Abs: 1.8 10*3/uL (ref 0.7–4.0)
MCH: 23.9 pg — ABNORMAL LOW (ref 26.0–34.0)
MCHC: 30.4 g/dL (ref 30.0–36.0)
MCV: 78.6 fL — ABNORMAL LOW (ref 80.0–100.0)
Monocytes Absolute: 0.4 10*3/uL (ref 0.1–1.0)
Monocytes Relative: 4 %
Neutro Abs: 8.2 10*3/uL — ABNORMAL HIGH (ref 1.7–7.7)
Neutrophils Relative %: 74 %
Platelets: 370 10*3/uL (ref 150–400)
RBC: 5.19 MIL/uL — ABNORMAL HIGH (ref 3.87–5.11)
RDW: 16.8 % — ABNORMAL HIGH (ref 11.5–15.5)
WBC: 11 10*3/uL — ABNORMAL HIGH (ref 4.0–10.5)
nRBC: 0 % (ref 0.0–0.2)

## 2019-06-26 LAB — FERRITIN: Ferritin: 106 ng/mL (ref 11–307)

## 2019-06-26 LAB — IRON AND TIBC
Iron: 26 ug/dL — ABNORMAL LOW (ref 41–142)
Saturation Ratios: 7 % — ABNORMAL LOW (ref 21–57)
TIBC: 366 ug/dL (ref 236–444)
UIBC: 341 ug/dL (ref 120–384)

## 2019-06-26 LAB — SEDIMENTATION RATE: Sed Rate: 25 mm/hr — ABNORMAL HIGH (ref 0–22)

## 2019-06-26 NOTE — Assessment & Plan Note (Signed)
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

## 2019-06-26 NOTE — Telephone Encounter (Signed)
Scheduled appt per 3/12 sch message - mailed reminder letter with app date  and time

## 2019-06-26 NOTE — Assessment & Plan Note (Signed)
She is currently not anemic Even though her iron saturation is low, in the absence of anemia and normal ferritin, there is no benefit to pursue intravenous iron infusion I plan to see her again in a year for further follow-up If she has recurrent anemia in the future, she will call me and we can see her back sooner

## 2019-06-26 NOTE — Progress Notes (Signed)
[facility] OFFICE PROGRESS NOTE  Patient Care Team: [facility] as PCP - General (Family Medicine) Ena Dawley, MD as Consulting Physician (Obstetrics and Gynecology) Murriel Hopper, MD as Referring Physician (Student)  ASSESSMENT & PLAN:  IDA (iron deficiency anemia) She is currently not anemic Even though her iron saturation is low, in the absence of anemia and normal ferritin, there is no benefit to pursue intravenous iron infusion I plan to see her again in a year for further follow-up If she has recurrent anemia in the future, she will call me and we can see her back sooner  Leukocytosis She has 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   All questions were answered. The patient knows to call the clinic with any problems, questions or concerns. The total time spent in the appointment was 15 minutes encounter with patients including review of chart and various tests results, discussions about plan of care and coordination of care plan   Heath Lark, MD 06/26/2019 12:19 PM  INTERVAL HISTORY: Please see below for problem oriented charting. She is seen back for recurrent anemia I look at her blood work from her rheumatologist drawn recently Her hemoglobin was 13 Her blood count today is normal She denies recent menstruation The patient denies any recent signs or symptoms of bleeding such as spontaneous epistaxis, hematuria or hematochezia.   SUMMARY OF HEMATOLOGIC HISTORY:  This patient have chronic fatigue, fibromyalgia, uterine fibroids with chronic menorrhagia, severe iron deficiency and reactive thrombocytosis. She had chronic microcytic anemia for many years. In fact, there were no changes with MCV and the highest hemoglobin was  10.7. She had received intermittent intravenous iron infusion in the past, her last infusion was in January 2014. The patient could not tolerate oral iron supplements. She have chronic fatigue with fibromyalgia, obstructive sleep apnea and have been oxygen therapy for 2 years. She takes regular ibuprofen for pelvic pain. She has irregular menstruation and abnormal Pap smear in the past The patient had history of intermittent epistaxis for many years, usually coming from the left nasal passage. The patient has severe microcytic anemia. She received multiple doses of intravenous iron, last given in 2018  REVIEW OF SYSTEMS:   Constitutional: Denies fevers, chills or abnormal weight loss Eyes: Denies blurriness of vision Ears, nose, mouth, throat, and face: Denies mucositis or sore throat Respiratory: Denies cough, dyspnea or wheezes Cardiovascular: Denies palpitation, chest discomfort or lower extremity swelling Gastrointestinal:  Denies nausea, heartburn or change in bowel habits Skin: Denies abnormal skin rashes Lymphatics: Denies new lymphadenopathy or easy bruising Neurological:Denies numbness, tingling or new weaknesses Behavioral/Psych: Mood is stable, no new changes  All other systems were reviewed with the patient and are negative.  I have reviewed the past medical history, past surgical history, social history and family history with the patient and they are unchanged from previous note.  ALLERGIES:  is allergic to cymbalta [duloxetine hcl]; latex; and morphine and related.  MEDICATIONS:  Current Outpatient Medications  Medication Sig Dispense Refill  . albuterol (PROAIR HFA) 108 (90 Base) MCG/ACT inhaler Inhale 2 puffs into the lungs every 6 (six) hours as needed for wheezing or shortness of breath. 1 Inhaler 0  . amLODipine (NORVASC) 10 MG tablet Take 10 mg by mouth daily.    . ARIPiprazole (ABILIFY) 2  MG tablet Take 1 tablet (2 mg total) by mouth at bedtime. 30 tablet 2  .  budesonide (PULMICORT) 0.5 MG/2ML nebulizer solution TAKE 2 MLS BY NEBULIZATION 2 TIMES DAILY. DX CODE: CHRONIC ASTHMA J45.909 120 mL 5  . clonazePAM (KLONOPIN) 0.5 MG tablet TAKE 1 TABLET TWICE A DAY AS NEEDED FOR ANXIETY 60 tablet 0  . cyclobenzaprine (FLEXERIL) 10 MG tablet Take 10 mg by mouth 3 (three) times daily.     . ferumoxytol (FERAHEME) 510 MG/17ML SOLN injection Inject 510 mg into the vein every 3 (three) months.     [name] FLUoxetine (PROZAC) 20 MG capsule Take 1 capsule (20 mg total) by mouth daily. 30 capsule 2  . fluticasone (FLONASE) 50 MCG/ACT nasal spray PLACE 2 SPRAYS INTO BOTH NOSTRILS 2 (TWO) TIMES DAILY. 16 g 5  . furosemide (LASIX) 80 MG tablet Take 80 mg by mouth 2 (two) times daily.    [name] ibuprofen (ADVIL,MOTRIN) 800 MG tablet Take 800 mg by mouth 3 (three) times daily as needed for moderate pain.    [name] ipratropium-albuterol (DUONEB) 0.5-2.5 (3) MG/3ML SOLN TAKE 3 MLS BY NEBULIZATION EVERY 4 HOURS AS NEEDED (FOR WHEEZING/SHORTNESS OF BREATH). 360 mL 5  . loratadine (CLARITIN) 10 MG tablet TAKE 10 MG BY MOUTH at night 90 tablet 1  . metoprolol tartrate (LOPRESSOR) 25 MG tablet Take 25 mg by mouth 2 (two) times daily.     . montelukast (SINGULAIR) 10 MG tablet TAKE 10 MG BY MOUTH AT BEDTIME 30 tablet 5  . Multiple Vitamins-Minerals (MULTIVITAMIN WITH MINERALS) tablet Take 1 tablet by mouth daily.    . ondansetron (ZOFRAN) 4 MG tablet Take 1 tablet (4 mg total) by mouth every 8 (eight) hours as needed for nausea or vomiting. 10 tablet 0  . oxyCODONE (ROXICODONE) 15 MG immediate release tablet Take 15 mg by mouth every 6 (six) hours as needed for pain.    . OXYGEN Inhale 2 L into the lungs at bedtime.    . pantoprazole (PROTONIX) 40 MG tablet Take 40 mg by mouth at night 90 tablet 3  . phenazopyridine (PYRIDIUM) 200 MG tablet Take 1 tablet (200 mg total) by mouth 3 (three) times daily as needed for pain. 6 tablet 0  . potassium chloride SA (K-DUR,KLOR-CON) 20 MEQ tablet Take 1 tablet  (20 mEq total) by mouth daily. (Patient taking differently: Take 20 mEq by mouth 2 (two) times daily. ) 7 tablet 0  . pregabalin (LYRICA) 300 MG capsule Take 300 mg by mouth 2 (two) times daily.     . rizatriptan (MAXALT) 10 MG tablet Take 10 mg by mouth as needed for migraine. May repeat in 2 hours if needed    . topiramate (TOPAMAX) 100 MG tablet Take 100 mg by mouth 2 (two) times daily.     [name] triamcinolone cream (KENALOG) 0.1 % Apply 1 application topically daily as needed (for rash).      No current facility-administered medications for this visit.    PHYSICAL EXAMINATION: ECOG PERFORMANCE STATUS: 0 - Asymptomatic  Vitals:   06/26/19 1012  BP: 129/72  Pulse: 82  Resp: 18  Temp: 98.9 F (37.2 C)  SpO2: 99%   Filed Weights   06/26/19 1012  Weight: 274 lb 3.2 oz (124.4 kg)    GENERAL:alert, no distress and comfortable NEURO: alert & oriented x 3 with fluent speech, no focal motor/sensory deficits  LABORATORY DATA:  I have reviewed the data as listed    Component Value Date/Time  NA 137 03/10/2017 1735   NA 139 08/29/2012 1102   K 3.0 (L) 03/10/2017 1735   K 3.2 (L) 08/29/2012 1102   CL 107 03/10/2017 1735   CL 105 08/29/2012 1102   CO2 23 03/10/2017 1735   CO2 23 08/29/2012 1102   GLUCOSE 107 (H) 03/10/2017 1735   GLUCOSE 130 (H) 08/29/2012 1102   BUN 11 03/10/2017 1735   BUN 5.8 (L) 08/29/2012 1102   CREATININE 0.57 03/10/2017 1735   CREATININE 0.8 08/29/2012 1102   CALCIUM 8.6 (L) 03/10/2017 1735   CALCIUM 8.6 08/29/2012 1102   PROT 8.0 08/06/2016 2057   PROT 7.3 08/29/2012 1102   ALBUMIN 3.9 08/06/2016 2057   ALBUMIN 3.6 08/29/2012 1102   AST 31 08/06/2016 2057   AST 22 08/29/2012 1102   ALT 29 08/06/2016 2057   ALT 23 08/29/2012 1102   ALKPHOS 72 08/06/2016 2057   ALKPHOS 86 08/29/2012 1102   BILITOT 0.4 08/06/2016 2057   BILITOT 0.22 08/29/2012 1102   GFRNONAA >60 03/10/2017 1735   GFRAA >60 03/10/2017 1735    No results found for: SPEP,  UPEP  Lab Results  Component Value Date   WBC 11.0 (H) 06/26/2019   NEUTROABS 8.2 (H) 06/26/2019   HGB 12.4 06/26/2019   HCT 40.8 06/26/2019   MCV 78.6 (L) 06/26/2019   PLT 370 06/26/2019      Chemistry      Component Value Date/Time   NA 137 03/10/2017 1735   NA 139 08/29/2012 1102   K 3.0 (L) 03/10/2017 1735   K 3.2 (L) 08/29/2012 1102   CL 107 03/10/2017 1735   CL 105 08/29/2012 1102   CO2 23 03/10/2017 1735   CO2 23 08/29/2012 1102   BUN 11 03/10/2017 1735   BUN 5.8 (L) 08/29/2012 1102   CREATININE 0.57 03/10/2017 1735   CREATININE 0.8 08/29/2012 1102      Component Value Date/Time   CALCIUM 8.6 (L) 03/10/2017 1735   CALCIUM 8.6 08/29/2012 1102   ALKPHOS 72 08/06/2016 2057   ALKPHOS 86 08/29/2012 1102   AST 31 08/06/2016 2057   AST 22 08/29/2012 1102   ALT 29 08/06/2016 2057   ALT 23 08/29/2012 1102   BILITOT 0.4 08/06/2016 2057   BILITOT 0.22 08/29/2012 1102

## 2019-07-02 ENCOUNTER — Ambulatory Visit: Payer: Medicaid Other

## 2019-08-01 DIAGNOSIS — K219 Gastro-esophageal reflux disease without esophagitis: Secondary | ICD-10-CM

## 2019-08-01 DIAGNOSIS — M150 Primary generalized (osteo)arthritis: Secondary | ICD-10-CM | POA: Insufficient documentation

## 2019-10-14 DIAGNOSIS — G629 Polyneuropathy, unspecified: Secondary | ICD-10-CM | POA: Insufficient documentation

## 2019-10-14 DIAGNOSIS — E611 Iron deficiency: Secondary | ICD-10-CM | POA: Insufficient documentation

## 2019-10-14 DIAGNOSIS — E782 Mixed hyperlipidemia: Secondary | ICD-10-CM | POA: Insufficient documentation

## 2019-10-14 DIAGNOSIS — K760 Fatty (change of) liver, not elsewhere classified: Secondary | ICD-10-CM | POA: Insufficient documentation

## 2019-11-16 ENCOUNTER — Other Ambulatory Visit: Payer: Self-pay

## 2019-11-16 ENCOUNTER — Emergency Department (HOSPITAL_BASED_OUTPATIENT_CLINIC_OR_DEPARTMENT_OTHER)
Admission: EM | Admit: 2019-11-16 | Discharge: 2019-11-16 | Disposition: A | Discharge status: Home / Self Care | Payer: 59 | Attending: Emergency Medicine | Admitting: Emergency Medicine

## 2019-11-16 ENCOUNTER — Encounter (HOSPITAL_BASED_OUTPATIENT_CLINIC_OR_DEPARTMENT_OTHER): Payer: Self-pay

## 2019-11-16 DIAGNOSIS — Z5321 Procedure and treatment not carried out due to patient leaving prior to being seen by health care provider: Secondary | ICD-10-CM | POA: Diagnosis not present

## 2019-11-16 DIAGNOSIS — R21 Rash and other nonspecific skin eruption: Secondary | ICD-10-CM | POA: Insufficient documentation

## 2019-11-16 NOTE — ED Triage Notes (Signed)
Pt c/o scattered rash/itching, blisters to LE 2 days ago-states may be r/t to latex contact-NAD-steady gait

## 2020-02-09 ENCOUNTER — Inpatient Hospital Stay
Admit: 2020-02-09 | Discharge: 2020-02-09 | Disposition: A | Discharge status: Home / Self Care | Payer: PRIVATE HEALTH INSURANCE | Attending: Emergency Medicine

## 2020-02-09 DIAGNOSIS — M545 Low back pain, unspecified: Secondary | ICD-10-CM

## 2020-02-09 LAB — URINALYSIS W/ RFLX MICROSCOPIC
Bilirubin, Urine: NEGATIVE
Bilirubin: NEGATIVE
Blood, Urine: NEGATIVE
Blood: NEGATIVE
Glucose, Ur: NEGATIVE mg/dL
Glucose: NEGATIVE mg/dL
Ketone: NEGATIVE mg/dL
Ketones, Urine: NEGATIVE mg/dL
Nitrite, Urine: NEGATIVE
Nitrites: NEGATIVE
Protein, UA: NEGATIVE mg/dL
Protein: NEGATIVE mg/dL
Specific Gravity, UA: 1.007 (ref 1.005–1.030)
Specific gravity: 1.007 (ref 1.005–1.030)
Urobilinogen, UA, POCT: 1 EU/dL (ref 0.2–1.0)
Urobilinogen: 1 EU/dL (ref 0.2–1.0)
pH (UA): 6 (ref 5.0–8.0)
pH, UA: 6 (ref 5.0–8.0)

## 2020-02-09 LAB — URINE MICROSCOPIC ONLY
BACTERIA, URINE: NEGATIVE /hpf
Bacteria: NEGATIVE /hpf
RBC, UA: 0 /hpf (ref 0–5)
RBC: 0 /hpf (ref 0–5)
WBC, UA: 4 /hpf (ref 0–5)
WBC: 4 /hpf (ref 0–5)

## 2020-02-09 MED ORDER — ONDANSETRON 4 MG TAB, RAPID DISSOLVE
4 mg | ORAL | Status: AC
Start: 2020-02-09 — End: 2020-02-09
  Administered 2020-02-09: 13:00:00 via ORAL

## 2020-02-09 MED ORDER — FENTANYL CITRATE (PF) 50 MCG/ML IJ SOLN
50 mcg/mL | INTRAMUSCULAR | Status: AC
Start: 2020-02-09 — End: 2020-02-09
  Administered 2020-02-09: 13:00:00 via INTRAMUSCULAR

## 2020-02-09 MED ORDER — NITROFURANTOIN (25% MACROCRYSTAL FORM) 100 MG CAP
100 mg | ORAL_CAPSULE | Freq: Two times a day (BID) | ORAL | 0 refills | Status: AC
Start: 2020-02-09 — End: 2020-02-16
  Filled 2020-02-09: qty 14, 7d supply, fill #0

## 2020-02-09 MED ORDER — TIZANIDINE 2 MG TAB
2 mg | ORAL | Status: AC
Start: 2020-02-09 — End: 2020-02-09
  Administered 2020-02-09: 13:00:00 via ORAL

## 2020-02-09 MED FILL — TIZANIDINE 2 MG TAB: 2 mg | ORAL | Qty: 1

## 2020-02-09 MED FILL — FENTANYL CITRATE (PF) 50 MCG/ML IJ SOLN: 50 mcg/mL | INTRAMUSCULAR | Qty: 2

## 2020-02-09 MED FILL — ONDANSETRON 4 MG TAB, RAPID DISSOLVE: 4 mg | ORAL | Qty: 1

## 2020-02-09 NOTE — ED Notes (Signed)
Pt arrives to room ambulatory with report of lower back spasm since Saturday. Pt reports hx of chronic back pain and fibromyalgia. Reports she is on daily pain medications. Reports she was caring for her 45 year old nephew on Friday and believes she may have exacerbated her back pain. Pt reports loss of bladder this morning. Denies injury or falls. Pt is in NAD at this time. Denies taking her pain medications this morning due to driving to pick up her mother.

## 2020-02-09 NOTE — ED Notes (Signed)
Pt ambulatory to triage with complaints of lowe back pain that is spasm-like in nature. Pt states that this morning she experienced an episode of urinary incontinence. PT had an MRI on 10/18 and was found to have abnormalities in her L- spine.     Pt appears to be in no distress during triage.

## 2020-02-09 NOTE — ED Provider Notes (Signed)
ED Provider Notes by Veronia Beets, PA at 02/09/20 0815                Author: Veronia Beets, PA  Service: Emergency Medicine  Author Type: Physician Assistant       Filed: 02/09/20 0930  Date of Service: 02/09/20 0815  Status: Attested           Editor: Veronia Beets, PA (Physician Assistant)  Cosigner: Buck Mam, MD at 02/10/20 [phone]          Attestation signed by Buck Mam, MD at 02/10/20 [phone]          I was personally available for consultation in the emergency department.                                      [name]   EMERGENCY DEPARTMENT HISTORY AND PHYSICAL EXAM      Date: 02/09/2020   Patient Name: Maria Conner        History of Presenting Illness          Chief Complaint       Patient presents with        ?  Back Pain              History Provided By: Patient      8:15 AM   Maria Conner is a 44 y.o.  female with PMHX of hypertension, hyperlipidemia, type 2 diabetes, OSA, chronic neck and low back pain who presents to the emergency department C/O exacerbation of her low back pain.  Patient states  she is currently visiting from [place] for a funeral.  Over the weekend she was lifting her 63-year-old family member frequently.  She denies any injury trauma or fall but believes that may have exacerbated her pain.  Patient is followed by spine  specialist in [place], had recent cervical facet injection and is scheduled for a lumbar facet injection on 02/15/2020.  This morning she woke up around 4 AM to use the restroom, when she got up her back and legs "locked up on me" and she had an  episode of urinary incontinence.  Patient states this has happened in the past, denies any bowel incontinence.  She currently takes oxycodone 15 mg twice daily and tizanidine for her chronic back pain, did not take her medications yet this morning.  She  states that she occasionally has to go to a hospital urgent care when her back pain is worse, has had fentanyl in the past without  adverse reaction but morphine makes her itch.  She has received Toradol in the past, but does not provide much relief.   Pt denies abdominal pain, dysuria, hematuria, fever, extremity numbness or weakness, and any other sxs or complaints.       PCP: Other, Phys, MD              Past History        Past Medical History:   No past medical history on file.      Past Surgical History:   No past surgical history on file.      Family History:   No family history on file.      Social History:     Social History          Tobacco Use         ?  Smoking status:  Not on file       Substance Use Topics         ?  Alcohol use:  Not on file         ?  Drug use:  Not on file           Allergies:     Allergies        Allergen  Reactions         ?  Latex  Hives                Review of Systems     Review of Systems    Constitutional: Negative for fever.    Genitourinary: Negative for dysuria and hematuria.    Musculoskeletal: Positive for back pain. Negative for neck pain.    Skin: Negative.     Neurological: Negative for seizures and weakness.    All other systems reviewed and are negative.              Physical Exam          Vitals:          02/09/20 0804        BP:  130/75     Pulse:  80     Resp:  21     Temp:  98.4 ??F (36.9 ??C)     SpO2:  98%     Weight:  125.2 kg (276 lb)        Height:  5\' 2"  (1.575 m)        Physical Exam   Vital signs and nursing notes reviewed.      CONSTITUTIONAL: Alert. Well-appearing; obese, in moderate pain distress.   HEAD: Normocephalic; atraumatic.           CV: Normal S1, S2; no murmurs, rubs, or gallops.    RESPIRATORY: Normal chest excursion with respiration; breath sounds clear and equal bilaterally; no wheezes, rhonchi, or rales.   GI: Obese, non-distended; non-tender.   RECTAL: Chaperoned by . Normal anal sphincter tone.   BACK:  No evidence of trauma or deformity. +generalized lower back tenderness; Slight decreased ROM due to pain and body habitus. Negative  straight leg raise  bilaterally.   EXT: Normal ROM in all four extremities; non-tender to palpation.  Sensation intact, no foot drop, 2+ DP pulses.   SKIN: Normal for age and race; warm; dry; good turgor; no apparent lesions or exudate.   NEURO: A & O x3.    PSYCH:  Mood and affect appropriate.                  Diagnostic Study Results        Labs -         Recent Results (from the past 12 hour(s))     URINALYSIS W/ RFLX MICROSCOPIC          Collection Time: 02/09/20  8:30 AM         Result  Value  Ref Range            Color  YELLOW          Appearance  CLEAR          Specific gravity  1.007  1.005 - 1.030         pH (UA)  6.0  5.0 - 8.0         Protein  Negative  NEG mg/dL       Glucose  Negative  NEG mg/dL       Ketone  Negative  NEG mg/dL       Bilirubin  Negative  NEG         Blood  Negative  NEG         Urobilinogen  1.0  0.2 - 1.0 EU/dL       Nitrites  Negative  NEG         Leukocyte Esterase  MODERATE (A)  NEG         URINE MICROSCOPIC ONLY          Collection Time: 02/09/20  8:30 AM         Result  Value  Ref Range            WBC  4 to 10  0 - 5 /hpf       RBC  0 to 3  0 - 5 /hpf       Epithelial cells  2+  0 - 5 /lpf            Bacteria  Negative  NEG /hpf           Radiologic Studies -      No orders to display          CT Results   (Last 48 hours)          None                 CXR Results   (Last 48 hours)          None                  Medications given in the ED-     Medications       ondansetron (ZOFRAN ODT) tablet 4 mg (4 mg Oral Given 02/09/20 0853)       fentaNYL citrate (PF) injection 50 mcg (50 mcg IntraMUSCular Given 02/09/20 0854)       tiZANidine (ZANAFLEX) tablet 2 mg (2 mg Oral Given 02/09/20 [id number])                Medical Decision Making     I am the first provider for this patient.      I reviewed the vital signs, available nursing notes, past medical history, past surgical history, family history and social history.      Vital Signs-Reviewed the patient's vital signs.      Records Reviewed: Nursing Notes  and  Old Medical Records      02/01/20- MRI Lumbar spine w/wo contrast  IMPRESSION:  1. 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 L4-5 anterolisthesis and  disc degeneration.  2. L4-5 moderate spinal stenosis and biforaminal  impingement.  3. No significant change when compared to 2018.      02/01/20- MRI Cervical Spine w/wo contrast   IMPRESSION:   Mild osteoarthritis of the C1-2 articulation without encroachment   upon the neural structures. Otherwise, normal examination.       11/09/19 XR Lumbar Spine   IMPRESSION:   1. No evidence of acute fracture or subluxation of the lumbar spine.   2. Stable chronic compression deformity of L5 with 10 mm    anterolisthesis and focal kyphosis at L4-L5 since October 2018   radiograph.             Procedures:   Procedures      ED Course:   8:15 AM    Initial assessment performed. The patients presenting problems  have been discussed, and they are in agreement with the care plan formulated and outlined with them.  I have encouraged them to ask questions as they arise throughout their visit.                Provider Notes (Medical Decision Making): Kaedence Connelly is a  44 y.o. female presents with acute exacerbation of her chronic low back pain.  No  injury or trauma but has lifted her young relative recently.  Had an episode of urinary incontinence this morning when her back and legs "locked up on her" and she was unable to get to the restroom in time.  No bowel incontinence, saddle anesthesia or  abnormal anal sphincter tone and she had a very recent lumbar MRI that showed DJD, with moderate L4-L5 spinal stenosis and foraminal impingement.  Pain was treated with IM fentanyl and her tizanidine.  UA c/w UTI, will culture and treat with Macrobid.   Recommend that she continue her current pain medications and follow-up with her spine specialist when she returns home this week.        Diagnosis and Disposition           DISCHARGE NOTE:      Maryjo Fahringer's  results have been reviewed with  her .  She has been counseled regarding her  diagnosis, treatment, and plan.  She verbally conveys understanding and agreement of the signs, symptoms, diagnosis, treatment and prognosis  and additionally agrees to follow up as discussed.  She also agrees with the care-plan and conveys that all of  her questions have been answered.  I have also provided discharge instructions for her that  include: educational information regarding their diagnosis and treatment, and list of reasons why they would want to return to the ED prior to their follow-up appointment, should her  condition change. She has been provided with education for proper emergency department utilization.       CLINICAL IMPRESSION:         1.  Acute exacerbation of chronic low back pain         2.  Acute cystitis without hematuria            PLAN:   1. D/C Home   2.      Current Discharge Medication List              START taking these medications          Details        nitrofurantoin, macrocrystal-monohydrate, (Macrobid) 100 mg capsule  Take 1 Capsule by mouth two (2) times a day for 7 days.   Qty: 14 Capsule, Refills:  0   Start date: 02/09/2020, End date:  02/16/2020                      3.      Follow-up Information               Follow up With  Specialties  Details  Why  Contact Info              Your PCP and Spine Specialist in [place]    Schedule an appointment as soon as possible for a visit                   [facility] EMERGENCY DEPT  Emergency Medicine    As needed, If symptoms worsen  [address]   [place] [address]  [phone]             _______________________________         Please note that this dictation was completed with Dragon, the computer voice recognition software.  Quite often unanticipated grammatical, syntax, homophones, and other interpretive errors are  inadvertently transcribed by the computer software.  Please disregard these errors.  Please excuse any errors that have escaped final proofreading.

## 2020-04-13 ENCOUNTER — Emergency Department: Admit: 2020-04-13 | Payer: PRIVATE HEALTH INSURANCE | Primary: Family Medicine

## 2020-04-13 ENCOUNTER — Inpatient Hospital Stay
Admit: 2020-04-13 | Discharge: 2020-04-13 | Disposition: A | Discharge status: Home / Self Care | Payer: PRIVATE HEALTH INSURANCE | Attending: Emergency Medicine

## 2020-04-13 DIAGNOSIS — U071 COVID-19: Secondary | ICD-10-CM

## 2020-04-13 LAB — COVID-19

## 2020-04-13 LAB — SARS-COV-2

## 2020-04-13 NOTE — ED Notes (Signed)
Pt c/o cough, congestion, headache since Monday.

## 2020-04-13 NOTE — ED Provider Notes (Signed)
EMERGENCY DEPARTMENT HISTORY AND PHYSICAL EXAM    Date: 04/13/2020  Patient Name: Maria Conner    History of Presenting Illness     Chief Complaint   Patient presents with   ??? Cough         History Provided By: Patient    Maria Conner is a 44 y.o. female with PMHX of asthma, diabetes, fibromyalgia, hypertension who presents to the emergency department C/O cough, myalgias. Patient has been traveling for the holiday, she and her 2 kids have had generalized body aches, flulike symptoms. She reports slight cough and congestion. Patient otherwise denies any significant chest pain, nausea, vomiting, diarrhea. Patient has previously been vaccinated for Covid.[name]     PCP: Other, Phys, MD        Past History     Past Medical History:  Past Medical History:   Diagnosis Date   ??? Asthma    ??? Diabetes (HCC)    ??? Fibromyalgia    ??? Hypertension    ??? Spinal stenosis        Past Surgical History:  History reviewed. No pertinent surgical history.    Family History:  History reviewed. No pertinent family history.    Social History:  Social History     Tobacco Use   ??? Smoking status: Never Smoker   ??? Smokeless tobacco: Never Used   Substance Use Topics   ??? Alcohol use: Not Currently   ??? Drug use: Not Currently       Allergies:  Allergies   Allergen Reactions   ??? Latex Hives         Review of Systems   Review of Systems   Constitutional: Negative for activity change and fever.   HENT: Negative for congestion and sore throat.    Eyes: Negative for discharge.   Respiratory: Positive for cough. Negative for apnea.    Cardiovascular: Negative for chest pain.   Gastrointestinal: Negative for abdominal distention.   Genitourinary: Negative for dysuria and flank pain.   Musculoskeletal: Negative for arthralgias.   Skin: Negative for rash.   Neurological: Negative for dizziness and weakness.   Hematological: Negative for adenopathy.   Psychiatric/Behavioral: Negative for agitation.   All other systems reviewed and are  negative.      Physical Exam     Vitals:    04/13/20 0124   BP: (!) 124/96   Pulse: (!) 108   Resp: 18   Temp: 98.8 ??F (37.1 ??C)   SpO2: 99%   Weight: 125.2 kg (276 lb)   Height: 5\' 2"  (1.575 m)     Physical Exam    Nursing notes and vital signs reviewed    Constitutional: Non toxic appearing, no acute distress  Head: Normocephalic, Atraumatic  Eyes: EOMI  Neck: Supple  Cardiovascular: Regular rate and rhythm, no murmurs, rubs, or gallops  Chest: Normal work of breathing and chest excursion bilaterally  Lungs: Clear to ausculation bilaterally  Abdomen: Soft, non tender, non distended, normoactive bowel sounds  Back: No evidence of trauma or deformity  Extremities: No evidence of trauma or deformity, no LE edema  Skin: Warm and dry, normal cap refill  Neuro: Alert and appropriate, CN intact, normal speech, strength and sensation full and symmetric bilaterally, normal gait, normal coordination  Psychiatric: Normal mood and affect      Diagnostic Study Results     Labs -     Recent Results (from the past 12 hour(s))   SARS-COV-2    Collection Time:  04/13/20  3:15 AM   Result Value Ref Range    SARS-CoV-2 Please find results under separate order         Radiologic Studies -   XR CHEST PORT   Final Result      No active cardiopulmonary disease.        CT Results  (Last 48 hours)    None        CXR Results  (Last 48 hours)               04/13/20 0318  XR CHEST PORT Final result    Impression:      No active cardiopulmonary disease.       Narrative:  EXAM: CHEST RADIOGRAPH, SINGLE VIEW       CLINICAL INDICATION/HISTORY: shortness of breath       COMPARISON: None.       TECHNIQUE: Portable frontal view of the chest was obtained.        _______________       FINDINGS:       SUPPORT DEVICES: None.       HEART AND MEDIASTINUM: Cardiomediastinal silhouette appears within normal   limits.  Normal caliber thoracic aorta. No central vascular congestion.       LUNGS AND PLEURAL SPACES: Lungs are well aerated with no confluent  airspace   opacity.  No pleural effusion or pneumothorax.         BONY THORAX AND SOFT TISSUES: No acute osseous abnormality.       _______________                 Medications given in the ED-  Medications - No data to display      Medical Decision Making   I am the first provider for this patient.    I reviewed the vital signs, available nursing notes, past medical history, past surgical history, family history and social history.    Vital Signs-Reviewed the patient's vital signs.    Pulse Oximetry Analysis - 100% on room air, not hypoxic     Records Reviewed: Old Medical Records    Provider Notes (Medical Decision Making): Maria Conner is a 44 y.o. female patient presents with upper respiratory and flu-like symptoms. Based on the ED assessment, patient's symptoms are most consistent with a viral illness, possibly COVID-19. Patient has previously been vaccinated. Chest x-ray is unremarkable with no evidence of pneumonia, pulmonary edema. Patient is safe for discharge home with conservative therapy. The following has been discussed with the patient: currently the patient is stable with no signs of a serious bacterial infection including meningitis, pneumonia, pyelonephritis, or other infectious, respiratory, cardiac, toxic, or other emergency medical condition. However, serious infection may be present in a mild, early form, and the patient may develop a worse infection over the next few days.  Patient instructed to return immediately if there are any mental status changes, persistent vomiting, new rash, difficulty breathing, or any other change in condition that concerns them. The patient appears to understand the discussion and agrees with the plan.    Procedures:  Procedures    ED Course:     Diagnosis and Disposition       DISCHARGE NOTE:    Maria Conner's  results have been reviewed with her.  She has been counseled regarding her diagnosis, treatment, and plan.  She verbally conveys understanding and  agreement of the signs, symptoms, diagnosis, treatment and prognosis and additionally agrees to follow up as discussed.  She also agrees with the care-plan and conveys that all of her questions have been answered.  I have also provided discharge instructions for her that include: educational information regarding their diagnosis and treatment, and list of reasons why they would want to return to the ED prior to their follow-up appointment, should her condition change. She has been provided with education for proper emergency department utilization.     CLINICAL IMPRESSION:    1. Viral URI with cough    2. Person under investigation for COVID-19        PLAN:  1. D/C Home  2. There are no discharge medications for this patient.    3.   Follow-up Information     Follow up With Specialties Details Why Contact Info    [facility] EMERGENCY DEPT Emergency Medicine  As needed, If symptoms worsen [address]  [place] [address]  [phone]        _______________________________      Please note that this dictation was completed with Dragon, the computer voice recognition software.  Quite often unanticipated grammatical, syntax, homophones, and other interpretive errors are inadvertently transcribed by the computer software.  Please disregard these errors.  Please excuse any errors that have escaped final proofreading.

## 2020-04-14 LAB — SARS-COV-2 BY NAA: SARS-CoV-2, NAA: DETECTED — CR

## 2020-04-14 NOTE — Progress Notes (Signed)
Date/Time:  04/14/2020 10:56 AM   Call within 2 business days of discharge: Yes   Attempted to reach patient by telephone. Left HIPPA compliant message requesting a return call. Will attempt to reach patient again.

## 2020-04-15 ENCOUNTER — Emergency Department: Admit: 2020-04-15 | Payer: MEDICAID | Primary: Family Medicine

## 2020-04-15 ENCOUNTER — Inpatient Hospital Stay
Admit: 2020-04-15 | Discharge: 2020-04-15 | Disposition: A | Discharge status: Home / Self Care | Payer: MEDICAID | Attending: Emergency Medicine

## 2020-04-15 DIAGNOSIS — U071 COVID-19: Secondary | ICD-10-CM

## 2020-04-15 MED ORDER — AZITHROMYCIN 250 MG TAB
250 mg | ORAL_TABLET | ORAL | 0 refills | Status: AC
Start: 2020-04-15 — End: 2020-04-20

## 2020-04-15 MED ORDER — SODIUM CHLORIDE 0.9 % IV PIGGY BACK
60 mg- mg/ mL | Freq: Once | INTRAVENOUS | Status: AC
Start: 2020-04-15 — End: 2020-04-15
  Administered 2020-04-15: 19:00:00 via INTRAVENOUS

## 2020-04-15 MED ORDER — ONDANSETRON 4 MG TAB, RAPID DISSOLVE
4 mg | ORAL_TABLET | Freq: Three times a day (TID) | ORAL | 2 refills | Status: AC | PRN
Start: 2020-04-15 — End: ?

## 2020-04-15 MED ORDER — ACETAMINOPHEN 500 MG TAB
500 mg | Freq: Once | ORAL | Status: AC
Start: 2020-04-15 — End: 2020-04-15
  Administered 2020-04-15: 19:00:00 via ORAL

## 2020-04-15 MED FILL — REGEN-COV (EUA) 60 MG-60 MG/ML INTRAVENOUS SOLUTION: 60 mg- mg/ mL | INTRAVENOUS | Qty: 1200

## 2020-04-15 MED FILL — ACETAMINOPHEN 500 MG TAB: 500 mg | ORAL | Qty: 2

## 2020-04-15 NOTE — ED Notes (Signed)
Patient recently tested positive for covid.  She states she was told that she qualified for the antibodies.

## 2020-04-15 NOTE — ED Provider Notes (Signed)
EMERGENCY DEPARTMENT HISTORY AND PHYSICAL EXAM    1:42 PM    Date: 04/15/2020  Patient Name: Maria Conner    History of Presenting Illness     Chief Complaint   Patient presents with   ??? Positive For Covid-19       History Provided By: Patient  Location/Duration/Severity/Modifying factors   HPI   Maria Conner is a 44 y.o. female with past no history of hypertension, diabetes, obesity presenting for monoclonal antibodies.  She says that for the last 4 days she has been having cold-like symptoms including myalgias, mild headaches, cough that is nonproductive, body aches, nasal congestion.  She does not have any significant chest pain, abdominal pain, nausea, vomiting or  diarrhea.  She has been vaccinated against Covid but did not receive her booster shot.  She was seen at the emergency room on 12/29, 2 days ago and received a positive PCR test.  She was told by her PCP that she is a candidate for monoclonal antibody so she came in today for therapy.  No other medical complaints.  She says if anything her symptoms have gotten slightly better.    PCP: Other, Phys, MD    Current Outpatient Medications   Medication Sig Dispense Refill   ??? azithromycin (Zithromax Z-Pak) 250 mg tablet Take as prescribed 6 Tablet 0       Past History     Past Medical History:  Past Medical History:   Diagnosis Date   ??? Asthma    ??? Diabetes (HCC)    ??? Fibromyalgia    ??? Hypertension    ??? Spinal stenosis        Past Surgical History:  No past surgical history on file.    Family History:  History reviewed. No pertinent family history.    Social History:  Social History     Tobacco Use   ??? Smoking status: Never Smoker   ??? Smokeless tobacco: Never Used   Substance Use Topics   ??? Alcohol use: Not Currently   ??? Drug use: Not Currently       Allergies:  Allergies   Allergen Reactions   ??? Latex Hives       I reviewed and confirmed the above information with patient and updated as necessary.    Review of Systems     Review of Systems    Constitutional: Positive for chills and fatigue. Negative for diaphoresis and fever.   HENT: Negative for ear pain and sore throat.    Eyes:        No acute change in vision   Respiratory: Positive for cough. Negative for shortness of breath.    Cardiovascular: Negative for chest pain and leg swelling.   Gastrointestinal: Negative for abdominal pain and vomiting.   Genitourinary: Negative for dysuria.   Musculoskeletal: Positive for myalgias. Negative for neck pain.   Skin: Negative for wound.   Neurological: Negative for weakness and headaches.       Physical Exam     Visit Vitals  BP 121/71   Pulse 84   Temp 98.7 ??F (37.1 ??C)   Resp 20   Ht 5\' 2"  (1.575 m)   Wt 121.1 kg (267 lb)   SpO2 100%   BMI 48.83 kg/m??       Physical Exam  Vitals and nursing note reviewed.   Constitutional:       Comments: Adult female, resting comfortably.  Obese   HENT:      Mouth/Throat:  Mouth: Mucous membranes are moist.   Eyes:      Pupils: Pupils are equal, round, and reactive to light.   Cardiovascular:      Rate and Rhythm: Normal rate and regular rhythm.      Pulses: Normal pulses.   Pulmonary:      Effort: Pulmonary effort is normal.      Breath sounds: Normal breath sounds.   Abdominal:      Palpations: Abdomen is soft.      Tenderness: There is no abdominal tenderness.   Musculoskeletal:         General: No signs of injury. Normal range of motion.      Cervical back: Normal range of motion.   Skin:     General: Skin is warm.   Neurological:      General: No focal deficit present.      Mental Status: She is alert and oriented to person, place, and time. Mental status is at baseline.         Diagnostic Study Results     Labs -  No results found for this or any previous visit (from the past 24 hour(s)).      Radiologic Studies -   XR CHEST PORT    (Results Pending)           Medical Decision Making   I am the first provider for this patient.    I reviewed the vital signs, available nursing notes, past medical history, past  surgical history, family history and social history.    Vital Signs-Reviewed the patient's vital signs.    Records Reviewed: Nursing Notes and Old Medical Records (Time of Review: 1:42 PM)    Provider Notes (Medical Decision Making):   MDM  44 year old female with comorbidities clear Covid positive requesting monoclonal antibodies.  Doubt underlying pneumonia but on the differential.  Overall clinically improving    ED Course: Progress Notes, Reevaluation, and Consults:  Patient arrives afebrile, hemodynamically normal satting 100% on room air  Exam is generally reassuring    We will repeat chest x-ray, treat with Tylenol and ordered monoclonal antibody therapies after long discussion of the risks and benefits.    Chest x-ray shows some possible consolidating early pneumonia in the right lower lobe, will cover with azithromycin just to be safe    Patient tolerated the infusion well, appropriate for discharge home.  Signs and symptoms prompting return to the ED were discussed.  She was discharged home in stable condition.           Procedures    Critical Care Time: None    Diagnosis     Clinical Impression:   1. Pneumonia due to COVID-19 virus        Disposition: Home    Follow-up Information     Follow up With Specialties Details Why Contact Info    [facility] EMERGENCY DEPT Emergency Medicine Schedule an appointment as soon as possible for a visit   [address]  [place] [id number]  [phone]           Patient's Medications   Start Taking    AZITHROMYCIN (ZITHROMAX Z-PAK) 250 MG TABLET    Take as prescribed   Continue Taking    No medications on file   These Medications have changed    No medications on file   Stop Taking    No medications on file       Charline BillsBrian Kealie Barrie, MD   Emergency Medicine  April 15, 2020, 1:42 PM     This note is dictated utilizing Conservation officer, historic buildings. Unfortunately this leads to occasional typographical errors using the voice recognition. I apologize in advance  if the situation occurs. If questions occur please do not hesitate to contact me directly.    Lynford Humphrey, MD

## 2020-04-18 NOTE — Progress Notes (Signed)
Date/Time:  04/18/2020 8:44 AM   Call within 2 business days of discharge: NA   Attempted to reach patient by telephone. Unable to leave HIPPA compliant message requesting a return call. Will attempt to reach patient again.

## 2020-04-22 NOTE — Progress Notes (Signed)
Date/Time:  04/22/2020 1:16 PM   Call within 2 business days of discharge: No   Attempted to reach patient by telephone. Left HIPPA compliant message requesting a return call.   This is second attempt to contact patient.  Episode resolved.

## 2020-04-22 NOTE — Progress Notes (Signed)
Patient returned call.    Patient contacted regarding COVID-19 monoclonal antibody infusion follow up. Discussed COVID-19 related testing which was available at this time. Test results were positive. Patient informed of results, if available? Patient was informed on 04/15/20 and returned to [facility] for infusion.     Ambulatory Care Manager contacted the patient by telephone to perform post discharge assessment. Call within 2 business days of discharge: Yes Verified name and DOB with patient as identifiers. Provided introduction to self, and explanation of the CTN/ACM role, and reason for call due to risk factors for infection and/or exposure to COVID-19.     Symptoms reviewed with patient who verbalized the following symptoms: fatigue, cough and shortness of breath, sore throat, headache      Due to no new or worsening symptoms encounter was not routed to provider for escalation. Discussed follow-up appointments. If no appointment was previously scheduled, appointment scheduling offered:  no.  [facility] follow up appointment(s): No future appointments.  Non-[facility] follow up appointment(s): Patient is communicating with PCP office about the fact that she is not feeling any better    Interventions to address risk factors: Obtained and reviewed discharge summary and/or continuity of care documents     Advance Care Planning:   Does patient have an Advance Directive: not on file.     Educated patient about risk for severe COVID-19 due to risk factors according to CDC guidelines. ACM reviewed discharge instructions, medical action plan and red flag symptoms with the patient who verbalized understanding. Discussed COVID vaccination status: yes. Education provided on COVID-19 vaccination as appropriate. Discussed exposure protocols and quarantine with CDC Guidelines. Patient was given an opportunity to verbalize any questions and concerns and agrees to contact ACM or health care provider for questions related to their  healthcare.    Reviewed and educated patient on any new and changed medications related to discharge diagnosis     Was patient discharged with a pulse oximeter? no Discussed and confirmed pulse oximeter discharge instructions and when to notify provider or seek emergency care.    ACM provided contact information. Plan for follow-up call in 5-7 days based on severity of symptoms and risk factors.    Patient has been vaccinated and is due for booster in in Feb. 2022

## 2020-04-23 ENCOUNTER — Other Ambulatory Visit: Payer: Self-pay

## 2020-04-23 ENCOUNTER — Emergency Department (HOSPITAL_BASED_OUTPATIENT_CLINIC_OR_DEPARTMENT_OTHER)
Admission: EM | Admit: 2020-04-23 | Discharge: 2020-04-23 | Disposition: A | Discharge status: Home / Self Care | Payer: 59 | Attending: Emergency Medicine | Admitting: Emergency Medicine

## 2020-04-23 ENCOUNTER — Encounter (HOSPITAL_BASED_OUTPATIENT_CLINIC_OR_DEPARTMENT_OTHER): Payer: Self-pay | Admitting: Emergency Medicine

## 2020-04-23 ENCOUNTER — Emergency Department (HOSPITAL_BASED_OUTPATIENT_CLINIC_OR_DEPARTMENT_OTHER): Payer: 59

## 2020-04-23 DIAGNOSIS — Z7951 Long term (current) use of inhaled steroids: Secondary | ICD-10-CM | POA: Diagnosis not present

## 2020-04-23 DIAGNOSIS — J189 Pneumonia, unspecified organism: Secondary | ICD-10-CM

## 2020-04-23 DIAGNOSIS — J45909 Unspecified asthma, uncomplicated: Secondary | ICD-10-CM | POA: Insufficient documentation

## 2020-04-23 DIAGNOSIS — J181 Lobar pneumonia, unspecified organism: Secondary | ICD-10-CM | POA: Insufficient documentation

## 2020-04-23 DIAGNOSIS — Z91040 Latex allergy status: Secondary | ICD-10-CM | POA: Diagnosis not present

## 2020-04-23 DIAGNOSIS — U071 COVID-19: Secondary | ICD-10-CM | POA: Insufficient documentation

## 2020-04-23 DIAGNOSIS — Z79899 Other long term (current) drug therapy: Secondary | ICD-10-CM | POA: Insufficient documentation

## 2020-04-23 DIAGNOSIS — R509 Fever, unspecified: Secondary | ICD-10-CM | POA: Diagnosis present

## 2020-04-23 DIAGNOSIS — I10 Essential (primary) hypertension: Secondary | ICD-10-CM | POA: Diagnosis not present

## 2020-04-23 LAB — URINALYSIS, ROUTINE W REFLEX MICROSCOPIC
Bilirubin Urine: NEGATIVE
Glucose, UA: NEGATIVE mg/dL
Hgb urine dipstick: NEGATIVE
Ketones, ur: NEGATIVE mg/dL
Leukocytes,Ua: NEGATIVE
Nitrite: NEGATIVE
Protein, ur: NEGATIVE mg/dL
Specific Gravity, Urine: 1.03 (ref 1.005–1.030)
pH: 5 (ref 5.0–8.0)

## 2020-04-23 LAB — CBC WITH DIFFERENTIAL/PLATELET
Abs Immature Granulocytes: 0.03 10*3/uL (ref 0.00–0.07)
Basophils Absolute: 0.1 10*3/uL (ref 0.0–0.1)
Basophils Relative: 1 %
Eosinophils Absolute: 0.2 10*3/uL (ref 0.0–0.5)
Eosinophils Relative: 2 %
HCT: 42.1 % (ref 36.0–46.0)
Hemoglobin: 12.7 g/dL (ref 12.0–15.0)
Immature Granulocytes: 0 %
Lymphocytes Relative: 29 %
Lymphs Abs: 2.9 10*3/uL (ref 0.7–4.0)
MCH: 25 pg — ABNORMAL LOW (ref 26.0–34.0)
MCHC: 30.2 g/dL (ref 30.0–36.0)
MCV: 82.9 fL (ref 80.0–100.0)
Monocytes Absolute: 0.6 10*3/uL (ref 0.1–1.0)
Monocytes Relative: 6 %
Neutro Abs: 6.1 10*3/uL (ref 1.7–7.7)
Neutrophils Relative %: 62 %
Platelets: 332 10*3/uL (ref 150–400)
RBC: 5.08 MIL/uL (ref 3.87–5.11)
RDW: 16.8 % — ABNORMAL HIGH (ref 11.5–15.5)
WBC: 9.9 10*3/uL (ref 4.0–10.5)
nRBC: 0 % (ref 0.0–0.2)

## 2020-04-23 LAB — COMPREHENSIVE METABOLIC PANEL
ALT: 15 U/L (ref 0–44)
AST: 21 U/L (ref 15–41)
Albumin: 4 g/dL (ref 3.5–5.0)
Alkaline Phosphatase: 65 U/L (ref 38–126)
Anion gap: 8 (ref 5–15)
BUN: 12 mg/dL (ref 6–20)
CO2: 24 mmol/L (ref 22–32)
Calcium: 9 mg/dL (ref 8.9–10.3)
Chloride: 106 mmol/L (ref 98–111)
Creatinine, Ser: 1.01 mg/dL — ABNORMAL HIGH (ref 0.44–1.00)
GFR, Estimated: >60 mL/min (ref >60)
Glucose, Bld: 103 mg/dL — ABNORMAL HIGH (ref 70–99)
Potassium: 4.4 mmol/L (ref 3.5–5.1)
Sodium: 138 mmol/L (ref 135–145)
Total Bilirubin: 0.3 mg/dL (ref 0.3–1.2)
Total Protein: 7.4 g/dL (ref 6.5–8.1)

## 2020-04-23 LAB — PREGNANCY, URINE: Preg Test, Ur: NEGATIVE

## 2020-04-23 MED ORDER — SODIUM CHLORIDE 0.9 % IV BOLUS
1000.0000 mL | Freq: Once | INTRAVENOUS | Status: AC
  Administered 2020-04-23: 1000 mL via INTRAVENOUS

## 2020-04-23 MED ORDER — KETOROLAC TROMETHAMINE 30 MG/ML IJ SOLN
30.0000 mg | Freq: Once | INTRAMUSCULAR | Status: AC
  Administered 2020-04-23: 30 mg via INTRAVENOUS
  Filled 2020-04-23: qty 1

## 2020-04-23 MED ORDER — CYCLOBENZAPRINE HCL 10 MG PO TABS: 10.0000 mg | ORAL_TABLET | Freq: Two times a day (BID) | ORAL | 0 refills | Status: DC | PRN

## 2020-04-23 MED ORDER — DOXYCYCLINE HYCLATE 100 MG PO CAPS: 100.0000 mg | ORAL_CAPSULE | Freq: Two times a day (BID) | ORAL | 0 refills | Status: AC

## 2020-04-23 MED ORDER — ONDANSETRON HCL 4 MG/2ML IJ SOLN
4.0000 mg | Freq: Once | INTRAMUSCULAR | Status: AC
  Administered 2020-04-23: 4 mg via INTRAVENOUS
  Filled 2020-04-23: qty 2

## 2020-04-23 MED ORDER — PROMETHAZINE HCL 25 MG PO TABS: 25.0000 mg | ORAL_TABLET | Freq: Four times a day (QID) | ORAL | 0 refills | Status: DC | PRN

## 2020-04-23 MED ORDER — CEPHALEXIN 500 MG PO CAPS: 500.0000 mg | ORAL_CAPSULE | Freq: Three times a day (TID) | ORAL | 0 refills | Status: AC

## 2020-04-23 NOTE — ED Notes (Signed)
Portable Xray at bedside.

## 2020-04-23 NOTE — ED Notes (Signed)
ED Provider at bedside. 

## 2020-04-23 NOTE — ED Notes (Signed)
Pt discharged to home. Discharge instructions have been discussed with patient and/or family members. Pt verbally acknowledges understanding d/c instructions, and endorses comprehension to checkout at registration before leaving.  °

## 2020-04-23 NOTE — ED Triage Notes (Signed)
Pt is c/o dizziness, light headedness, fever, nausea, vomiting and flank pain, and chills  Pt was diagnosed with covid on 04/15/20 in [place] states she was admitted to the hospital and was given the covid antibody infusion at a different hospital

## 2020-04-23 NOTE — ED Notes (Signed)
Pt requesting pain medication, will inform EDP Sch[place]

## 2020-04-23 NOTE — ED Notes (Signed)
Bedside commode at bedside for patient convenience. Pt aware urine specimen ordered.

## 2020-04-23 NOTE — ED Provider Notes (Signed)
[facility] EMERGENCY DEPARTMENT Provider Note   CSN: [id number] Arrival date & time: 04/23/20  7858     History Chief Complaint  Patient presents with  . Covid Positive    Cheyenne Arias is a 45 y.o. female.  HPI      45 year old female with history of hypertension, bipolar, fibromyalgia, migraines, obstructive sleep apnea, spinal stenosis, recent COVID-19 diagnosis on December 31, presents with concern for continuing symptoms of fever, chills, fatigue, nausea, vomiting, headache, lightheadedness.  Nausea, vomiting, headache, fever, dizziness, scratchy throat Fever keeps coming back Diagnosed 12/31COVID 19 Off and on shortness of breath, flank pain and back pain Been sick since 12/29 Had MAB infusion Tues Wed was feeling better, Thurs night had some symptoms, BP 154/100 Vomiting 4 times per day, has nausea medicine which is helping but not able to take much in.  Has only been able to eat some crackers.  Has had significant amounts of diarrhea.  Feeling lightheaded, cold and weak Has been vaccinated x2  Past Medical History:  Diagnosis Date  . Allergy    seasonal  . Anemia    goes to [facility], ferahem  . Anxiety   . Arthritis    spinal stenosis  . Asthma   . Bipolar disorder (HCC)   . Cervical cancer (HCC)   . Depression   . Fatty liver disease, nonalcoholic   . Fibromyalgia   . Food poisoning   . GERD (gastroesophageal reflux disease)   . 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 HSV infection   . Hypertension   . Lumbar radiculopathy   . Migraines   . Neuropathy   . Obesity   . OSA (obstructive sleep apnea)   . OSA on CPAP    uses it nightly  . Peripheral neuropathy    LLE - pins and needles  . Shortness of breath dyspnea    with exertion  . Spinal stenosis     Patient Active Problem List   Diagnosis Date Noted  . 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 Hematochezia   . BV (bacterial vaginosis) 10/23/2015  . Pelvic pain in female  10/23/2015  . CAP (community acquired pneumonia) 07/25/2015  . Asthma, chronic 07/02/2015  . OSA (obstructive sleep apnea) 07/02/2015  . Hypokalemia 07/02/2015  . Sepsis due to pneumonia (HCC) 07/02/2015  . Leukocytosis 07/02/2015  . IDA (iron deficiency anemia) 07/02/2015  . Benign essential HTN 07/12/2011  . Fibromyalgia 07/26/2009    Past Surgical History:  Procedure Laterality Date  . CARPAL TUNNEL RELEASE Right   . CESAREAN SECTION  1994, 1996, 2013   x 3  . CESAREAN SECTION  11/28/2011   Procedure: CESAREAN SECTION;  Surgeon: Adam Phenix, MD;  Location: [facility];  Service: Obstetrics;  Laterality: N/A;  . COLONOSCOPY N/A 04/01/2017   Procedure: COLONOSCOPY;  Surgeon: Meryl Dare, MD;  Location: [facility];  Service: Endoscopy;  Laterality: N/A;  . COLPOSCOPY    . DILITATION & CURRETTAGE/HYSTROSCOPY WITH NOVASURE ABLATION N/A 02/13/2017   Procedure: DILATATION & CURETTAGE/HYSTEROSCOPY WITH NOVASURE ABLATION;  Surgeon: Kirkland Hun, MD;  Location: [facility];  Service: Gynecology;  Laterality: N/A;  . FACET JOINT INJECTION     multiple injections  . HYSTEROSCOPY WITH D & C N/A 08/17/2015   Procedure: DILATATION AND CURETTAGE /HYSTEROSCOPY;  Surgeon: Kirkland Hun, MD;  Location: [facility];  Service: Gynecology;  Laterality: N/A;  . MANDIBLE SURGERY  2008  . WISDOM TOOTH EXTRACTION  OB History    Gravida  3   Para  3   Term  3   Preterm      AB      Living  3     SAB      IAB      Ectopic      Multiple      Live Births  3           Family History  Problem Relation Age of Onset  . Hypertension Mother   . Sickle cell trait Maternal Aunt   . Sickle cell anemia Other   . Schizophrenia Son   . Depression Daughter   . Kidney disease Father        transplant required  . Stomach cancer Neg Hx   . Colon cancer Neg Hx     Social History   Tobacco Use  . Smoking status: Never Smoker  . Smokeless tobacco: Never Used  Vaping Use  . Vaping  Use: Never used  Substance Use Topics  . Alcohol use: No  . Drug use: No    Home Medications Prior to Admission medications   Medication Sig Start Date End Date Taking? Authorizing Provider  cephALEXin (KEFLEX) 500 MG capsule Take 1 capsule (500 mg total) by mouth 3 (three) times daily for 7 days. 04/23/20 04/30/20 Yes Gareth Morgan, MD  cyclobenzaprine (FLEXERIL) 10 MG tablet Take 1 tablet (10 mg total) by mouth 2 (two) times daily as needed for muscle spasms. 04/23/20  Yes Gareth Morgan, MD  doxycycline (VIBRAMYCIN) 100 MG capsule Take 1 capsule (100 mg total) by mouth 2 (two) times daily for 7 days. 04/23/20 04/30/20 Yes Gareth Morgan, MD  promethazine (PHENERGAN) 25 MG tablet Take 1 tablet (25 mg total) by mouth every 6 (six) hours as needed for nausea or vomiting. 04/23/20  Yes Gareth Morgan, MD  albuterol (PROAIR HFA) 108 (90 Base) MCG/ACT inhaler Inhale 2 puffs into the lungs every 6 (six) hours as needed for wheezing or shortness of breath. 07/22/18   Chesley Mires, MD  amLODipine (NORVASC) 10 MG tablet Take 10 mg by mouth daily.    [provider]  ARIPiprazole (ABILIFY) 2 MG tablet Take 1 tablet (2 mg total) by mouth at bedtime. 05/08/17   Merian Capron, MD  budesonide (PULMICORT) 0.5 MG/2ML nebulizer solution TAKE 2 MLS BY NEBULIZATION 2 TIMES DAILY. DX CODE: CHRONIC ASTHMA J45.909 03/05/17   Magdalen Spatz, NP  clonazePAM (KLONOPIN) 0.5 MG tablet TAKE 1 TABLET TWICE A DAY AS NEEDED FOR ANXIETY 07/12/17   Merian Capron, MD  ferumoxytol [facility]) 510 MG/17ML SOLN injection Inject 510 mg into the vein every 3 (three) months.     [provider]  FLUoxetine (PROZAC) 20 MG capsule Take 1 capsule (20 mg total) by mouth daily. 05/08/17   Merian Capron, MD  fluticasone (FLONASE) 50 MCG/ACT nasal spray PLACE 2 SPRAYS INTO BOTH NOSTRILS 2 (TWO) TIMES DAILY. 04/22/17   Chesley Mires, MD  furosemide (LASIX) 80 MG tablet Take 80 mg by mouth 2 (two) times daily.    [provider]  ibuprofen (ADVIL,MOTRIN) 800 MG tablet Take 800 mg by mouth 3 (three) times daily as needed for moderate pain.    [provider]  ipratropium-albuterol (DUONEB) 0.5-2.5 (3) MG/3ML SOLN TAKE 3 MLS BY NEBULIZATION EVERY 4 HOURS AS NEEDED (FOR WHEEZING/SHORTNESS OF BREATH). 03/05/17   Magdalen Spatz, NP  loratadine (CLARITIN) 10 MG tablet TAKE 10 MG BY MOUTH at night  04/22/17   Coralyn HellingSood, Vineet, MD  metoprolol tartrate (LOPRESSOR) 25 MG tablet Take 25 mg by mouth 2 (two) times daily.     [provider]  montelukast (SINGULAIR) 10 MG tablet TAKE 10 MG BY MOUTH AT BEDTIME 01/08/18   Coralyn HellingSood, Vineet, MD  Multiple Vitamins-Minerals (MULTIVITAMIN WITH MINERALS) tablet Take 1 tablet by mouth daily.    [provider]  ondansetron (ZOFRAN) 4 MG tablet Take 1 tablet (4 mg total) by mouth every 8 (eight) hours as needed for nausea or vomiting. 02/13/17   Kirkland HunStringer, Arthur, MD  oxyCODONE (ROXICODONE) 15 MG immediate release tablet Take 15 mg by mouth every 6 (six) hours as needed for pain.    [provider]  OXYGEN Inhale 2 L into the lungs at bedtime.    [provider]  pantoprazole (PROTONIX) 40 MG tablet Take 40 mg by mouth at night 09/06/17   Coralyn HellingSood, Vineet, MD  phenazopyridine (PYRIDIUM) 200 MG tablet Take 1 tablet (200 mg total) by mouth 3 (three) times daily as needed for pain. 02/07/19   Little, Ambrose Finlandachel Morgan, MD  potassium chloride SA (K-DUR,KLOR-CON) 20 MEQ tablet Take 1 tablet (20 mEq total) by mouth daily. Patient taking differently: Take 20 mEq by mouth 2 (two) times daily.  08/06/16   Armando ReichertHogan, Heather D, CNM  pregabalin (LYRICA) 300 MG capsule Take 300 mg by mouth 2 (two) times daily.     [provider]  rizatriptan (MAXALT) 10 MG tablet Take 10 mg by mouth as needed for migraine. May repeat in 2 hours if needed    [provider]  topiramate (TOPAMAX) 100 MG tablet Take 100 mg by mouth 2 (two) times daily.     [provider]  triamcinolone cream (KENALOG) 0.1 % Apply 1 application topically daily as needed (for rash).  07/23/16   [provider]  zolpidem (AMBIEN) 5 MG tablet Take 5 mg by mouth at bedtime as needed for sleep.   05/10/14  [provider]    Allergies    Cymbalta [duloxetine hcl], Latex, and Morphine and related  Review of Systems   Review of Systems  Constitutional: Positive for appetite change, chills, fatigue and fever.  HENT: Positive for sore throat.   Eyes: Negative for visual disturbance.  Respiratory: Positive for cough and shortness of breath (off and on).   Cardiovascular: Negative for chest pain.  Gastrointestinal: Positive for nausea and vomiting. Negative for abdominal pain.  Genitourinary: Positive for flank pain. Negative for difficulty urinating.  Musculoskeletal: Negative for back pain and neck pain.  Skin: Negative for rash.  Neurological: Positive for light-headedness and headaches. Negative for syncope.    Physical Exam Updated Vital Signs BP 114/87   Pulse (!) 57   Temp 98.1 F (36.7 C) (Oral)   Resp 12   Ht 5\' 2"  (1.575 m)   Wt 122.9 kg   SpO2 100%   BMI 49.57 kg/m   Physical Exam Vitals and nursing note reviewed.  Constitutional:      General: She is not in acute distress.    Appearance: She is well-developed and well-nourished. She is not diaphoretic.  HENT:     Head: Normocephalic and atraumatic.  Eyes:     Extraocular Movements: EOM normal.     Conjunctiva/sclera: Conjunctivae normal.  Cardiovascular:     Rate and Rhythm: Normal rate and regular rhythm.     Pulses: Intact distal pulses.     Heart sounds: Normal heart sounds. No murmur heard.  No friction rub. No gallop.   Pulmonary:     Effort: Pulmonary effort is normal. No respiratory distress.     Breath sounds: Normal breath sounds. No wheezing or rales.  Abdominal:     General: There is no distension.     Palpations: Abdomen is soft.     Tenderness: There is no  abdominal tenderness. There is no guarding.  Musculoskeletal:        General: No tenderness or edema.     Cervical back: Normal range of motion.  Skin:    General: Skin is warm and dry.     Findings: No erythema or rash.  Neurological:     Mental Status: She is alert and oriented to person, place, and time.     ED Results / Procedures / Treatments   Labs (all labs ordered are listed, but only abnormal results are displayed) Labs Reviewed  CBC WITH DIFFERENTIAL/PLATELET - Abnormal; Notable for the following components:      Result Value   MCH 25.0 (*)    RDW 16.8 (*)    All other components within normal limits  COMPREHENSIVE METABOLIC PANEL - Abnormal; Notable for the following components:   Glucose, Bld 103 (*)    Creatinine, Ser 1.01 (*)    All other components within normal limits  PREGNANCY, URINE  URINALYSIS, ROUTINE W REFLEX MICROSCOPIC    EKG EKG Interpretation  Date/Time:  Saturday April 23 2020 08:10:53 EST Ventricular Rate:  65 PR Interval:    QRS Duration: 73 QT Interval:  415 QTC Calculation: 432 R Axis:   12 Text Interpretation: Sinus arrhythmia Low voltage, precordial leads No significant change since last tracing Confirmed by Gareth Morgan [phone]) on 04/23/2020 9:49:24 AM   Radiology DG Chest Portable 1 View  Result Date: 04/23/2020 CLINICAL DATA:  Shortness of breath.  COVID positive test on 12/31. EXAM: PORTABLE CHEST 1 VIEW COMPARISON:  Chest x-ray dated 11/27/2017. FINDINGS: Borderline cardiomegaly. LEFT lower lobe obscured by overlying soft tissues. RIGHT lung bases grossly clear. Upper lobes appear clear. No pneumothorax is seen. Osseous structures about the chest are unremarkable. IMPRESSION: 1. LEFT lower lobe obscured by overlying soft tissues. Cannot exclude pneumonia at the LEFT lung base. 2. Borderline cardiomegaly. Electronically Signed   By: Franki Cabot M.D.   On: 04/23/2020 08:46    Procedures Procedures (including critical care  time)  Medications Ordered in ED Medications  sodium chloride 0.9 % bolus 1,000 mL ( Intravenous Stopped 04/23/20 0943)  ondansetron (ZOFRAN) injection 4 mg (4 mg Intravenous Given 04/23/20 0840)  ketorolac (TORADOL) 30 MG/ML injection 30 mg (30 mg Intravenous Given 04/23/20 1005)    ED Course  I have reviewed the triage vital signs and the nursing notes.  Pertinent labs & imaging results that were available during my care of the patient were reviewed by me and considered in my medical decision making (see chart for details).    MDM Rules/Calculators/A&P                          45 year old female with history of hypertension, bipolar, fibromyalgia, migraines, obstructive sleep apnea, spinal stenosis, recent COVID-19 diagnosis on December 31, presents with concern for continuing symptoms of fever, chills, fatigue, nausea, vomiting, headache, lightheadedness.  Suspect likely continued symptomatic COVID 19 infection, however, differential diagnosis continues to nclude anemia, electrolyte abnormality, cardiac abnormality, infection, hypothyroidism, other toxic/metabolic abnormalities.  No focal neurologic concerns on history or exam to suggest CVA,  ICH or other central etiology. Urinalysis shows no UTI. Chest x-ray shows LLL obscured, cannot rule out pneumonia of left lung base. CBC showed no sign of anemia or WBC.  Electrolytes WNL.  EKG was not changed from prior, patient does not have chest pain and have low suspicion for cardiac etiology of symptoms. No hypoxia, tachypnea, tachycardia and doubt PE.  No indication for need for admission for COVID 19.   Recommend continued supportive care. Suspect symptoms secondary to continuing COVID 19 infection, however cannot rule out superimposed bacterial pneumonia and given improvement followed by worsening will treat for pneumonia with keflex/doxycycline. Given rx for phenergan, flexeril. Patient discharged in stable condition with understanding of reasons to  return.     Final Clinical Impression(s) / ED Diagnoses Final diagnoses:  [id number]  Community acquired pneumonia of left lower lobe of lung    Rx / DC Orders ED Discharge Orders         Ordered    doxycycline (VIBRAMYCIN) 100 MG capsule  2 times daily        04/23/20 1128    cephALEXin (KEFLEX) 500 MG capsule  3 times daily        04/23/20 1128    promethazine (PHENERGAN) 25 MG tablet  Every 6 hours PRN        04/23/20 1129    cyclobenzaprine (FLEXERIL) 10 MG tablet  2 times daily PRN        04/23/20 1130           Gareth Morgan, MD 04/23/20 2055

## 2020-04-26 NOTE — Progress Notes (Signed)
Patient contacted regarding COVID-19 monoclonal antibody infusion follow up . Discussed COVID-19 related testing which was available at this time. Test results were positive. Patient informed of results, if available? patient tested positive on 04/13/20      Ambulatory Care Manager contacted the patient by telephone to perform follow-up assessment. Verified name and DOB with patient as identifiers. Patient has following risk factors of: asthma and diabetes.      Symptoms reviewed with patient who verbalized the following symptoms: fever, fatigue, cough and sore throat. States she feels better.  Due to no new or worsening symptoms encounter was not routed to provider for escalation. Patient had VV with PCP 04/25/20.    Interventions to address risk factors: continuity of care    Educated patient about risk for severe COVID-19 due to risk factors according to CDC guidelines. ACM reviewed discharge instructions, medical action plan and red flag symptoms with the patient who verbalized understanding. Discussed COVID vaccination status: no. Education provided on COVID-19 vaccination as appropriate. Discussed exposure protocols and quarantine with CDC Guidelines. Patient was given an opportunity to verbalize any questions and concerns and agrees to contact ACM or health care provider for questions related to their healthcare.    Reviewed and educated patient on any new and changed medications related to discharge diagnosis     Was patient discharged with a pulse oximeter? no Discussed and confirmed pulse oximeter discharge instructions and when to notify provider or seek emergency care.    ACM provided contact information. Plan for follow-up call in 5-7 days based on severity of symptoms and risk factors.    Patient reports that she visited ED in [place] on 04/23/20 and received antibiotic.

## 2020-05-03 NOTE — Progress Notes (Signed)
Date/Time:  05/03/2020 4:28 PM   Call within 2 business days of discharge: NA   Attempted to reach patient by telephone. Left HIPPA compliant message requesting a return call.       Patient resolved from COVID Care Transitions episode on 05/03/20.    Patient/family has been provided the following resources and education related to COVID-19:                         Signs, symptoms and red flags related to COVID-19            CDC exposure and quarantine guidelines            [facility] exposure contact - [phone]            Contact for their local Department of Health               No further outreach scheduled with this CTN/ACM/LPN/HC/ MA.  Episode of Care resolved.  Patient has this CTN/ACM/LPN/HC/MA contact information if future needs arise.

## 2020-05-05 DIAGNOSIS — F339 Major depressive disorder, recurrent, unspecified: Secondary | ICD-10-CM | POA: Insufficient documentation

## 2020-06-22 ENCOUNTER — Other Ambulatory Visit: Payer: Self-pay | Admitting: Hematology and Oncology

## 2020-06-22 ENCOUNTER — Telehealth: Payer: Self-pay | Admitting: *Deleted

## 2020-06-22 DIAGNOSIS — D509 Iron deficiency anemia, unspecified: Secondary | ICD-10-CM

## 2020-06-22 NOTE — Telephone Encounter (Signed)
Attempted to call patient and daughter to remind them of her appt on Friday. No answer/no voicemail

## 2020-06-22 NOTE — Telephone Encounter (Signed)
-----   Message from Heath Lark, MD sent at 06/22/2020 11:36 AM EST ----- Regarding: multiple no shows Can you call to remind her of her app?

## 2020-06-24 ENCOUNTER — Inpatient Hospital Stay: Payer: 59

## 2020-06-24 ENCOUNTER — Encounter: Payer: Self-pay | Admitting: Hematology and Oncology

## 2020-06-24 ENCOUNTER — Inpatient Hospital Stay: Payer: 59 | Attending: Hematology and Oncology | Admitting: Hematology and Oncology

## 2020-07-28 ENCOUNTER — Telehealth: Payer: Self-pay | Admitting: Pulmonary Disease

## 2020-07-28 NOTE — Telephone Encounter (Signed)
Pt returning missed called. Informed that "We are not able to send in any meds for pt without an appt. If pt does not want to make an appt, she can try to call PCP or go to either UC or ED." per [facility]. Sched for Monday April 18th. Pt asked if something could be called to hold her off until Monday. Please advise. [phone]

## 2020-07-28 NOTE — Telephone Encounter (Signed)
Pt returning a phone call. Pt can be reached at [phone].

## 2020-07-28 NOTE — Telephone Encounter (Signed)
Spoke with patient, provided recommendations per Rexene Edison NP.  Patient was frustrated that we did not have any OV availability or telehealth availability.  Advised that we are limited on the amount of providers that are available in the office today and their are no appointments available.  Her pcp is not in the office on Thursday.  She has an appointment on Monday with Geraldo Pitter NP.  She stated she did not want to have to go to the UC because she has her child with her, but does not want to end up in the hospital over the weekend so she will go to the UC.  Nothing further needed.

## 2020-07-28 NOTE — Telephone Encounter (Signed)
Primary Pulmonologist: Sood Last office visit and with whom: 11/25/17 with Halford Chessman What do we see them for (pulmonary problems): asthma, OSA Last OV assessment/plan: Assessment/Plan:  Allergic asthma with eosinophilic phenotype. - continue singulair, pulmicort, and duoneb - check RAST with IgE, CBC with diff, CXR, and PFT  Obstructive sleep apnea. - she feels like CPAP needs to be increased - will change to CPAP 13 cm H2O  Chronic hypoxic respiratory failure 2nd to obesity hypoventilation syndrome. - she doesn't think recent titration study was accurate due to different sleep position and not being on her usual medications - will arrange for repeat CPAP titration study and determine if she qualifies for oxygen at night - explained this might be done prior to her follow up  Morbid obesity. - discussed importance of weight loss   Patient Instructions  Lab tests and chest xray today Will schedule pulmonary function test and CPAP titration study  Follow up in 2 to 3 weeks with Dr. Halford Chessman or Nurse Practitioner  Was appointment offered to patient (explain)?  Pt did schedule appt 08/01/20   Reason for call: Called and spoke with pt who states she has had symptoms of cough with thick yellow phlegm, wheezing, hoarseness of voice, itchy watery eyes, and nasal drainage which began 3-4 days ago. Asked pt if she has had a fever, states that she denies any complaints but has not checked temp. Pt has had 2 of her 3 covid vaccines.  Pt states that she has been overusing her rescue inhaler (at least 6 times daily) which has helped some with the cough and wheezing but then it does wear off quickly. Pt said she has been using her nebulizer at least every 4 hours.  Pt is using the flonase nasal spray and has also been using Clairitin daily. Pt said that she is currently out of Singulair so has not been using that nor did she remember to tell her PCP that she needed a refill of it when she had a visit  last week with them.  Pt said that she has been having to sit up instead of lying down due to all the nasal drainage and congestion she has had as she feels better when she is sitting up.  Pt did get an appt scheduled with office 4/18 but she wants to know if meds can be called in now for her to begin to take with the weekend coming up. Tammy, please advise.    Allergies  Allergen Reactions  . Cymbalta [Duloxetine Hcl] Other (See Comments)    Head felt cold and tight, loss of conciousness  . Latex Itching, Swelling and Other (See Comments)    Reaction:  Blisters   . Morphine And Related Itching    Immunization History  Administered Date(s) Administered  . Influenza Split 02/02/2013, 11/25/2014, 01/18/2017, 01/14/2018  . Influenza,inj,Quad PF,6+ Mos 02/02/2013, 01/18/2017  . Influenza,inj,Quad PF,6-35 Mos 01/06/2020  . Influenza,inj,quad, With Preservative 02/02/2013, 11/25/2014, 01/18/2017  . Influenza-Unspecified 03/23/2014  . Moderna Sars-Covid-2 Vaccination 08/18/2019, 09/15/2019  . Pneumococcal Conjugate-13 04/25/2018  . Pneumococcal Polysaccharide-23 04/06/2015  . Tdap 11/14/2017, 11/14/2017

## 2020-07-28 NOTE — Telephone Encounter (Signed)
Pt has not been seen at office since 11/25/2017 so will need an appt scheduled.  We are not able to send in any meds for pt without an appt. If pt does not want to make an appt, she can try to call PCP or go to either UC or ED.  Attempted to call pt but unable to reach. Left message for pt to return call.  When pt calls back, please schedule an appt first avail either with APP or Dr. Halford Chessman.

## 2020-07-28 NOTE — Telephone Encounter (Signed)
Sorry to hear she is sick .  Not seen in >2.15yr  Will need to go to urgent care for evaluation of acute symptoms .   Please contact office for sooner follow up if symptoms do not improve or worsen or seek emergency care

## 2020-08-01 ENCOUNTER — Encounter: Payer: Self-pay | Admitting: Primary Care

## 2020-08-01 ENCOUNTER — Ambulatory Visit (INDEPENDENT_AMBULATORY_CARE_PROVIDER_SITE_OTHER): Payer: 59 | Admitting: Primary Care

## 2020-08-01 ENCOUNTER — Other Ambulatory Visit: Payer: Self-pay

## 2020-08-01 ENCOUNTER — Ambulatory Visit (INDEPENDENT_AMBULATORY_CARE_PROVIDER_SITE_OTHER): Payer: 59

## 2020-08-01 VITALS — BP 116/80 | Temp 98.9°F | Ht 62.0 in | Wt 281.2 lb

## 2020-08-01 DIAGNOSIS — J45909 Unspecified asthma, uncomplicated: Secondary | ICD-10-CM

## 2020-08-01 DIAGNOSIS — G4733 Obstructive sleep apnea (adult) (pediatric): Secondary | ICD-10-CM

## 2020-08-01 DIAGNOSIS — D259 Leiomyoma of uterus, unspecified: Secondary | ICD-10-CM

## 2020-08-01 DIAGNOSIS — N390 Urinary tract infection, site not specified: Secondary | ICD-10-CM | POA: Diagnosis not present

## 2020-08-01 DIAGNOSIS — R110 Nausea: Secondary | ICD-10-CM

## 2020-08-01 MED ORDER — BENZONATATE 100 MG PO CAPS: 100.0000 mg | ORAL_CAPSULE | Freq: Three times a day (TID) | ORAL | 1 refills | Status: DC

## 2020-08-01 MED ORDER — IPRATROPIUM-ALBUTEROL 0.5-2.5 (3) MG/3ML IN SOLN: RESPIRATORY_TRACT | 5 refills | Status: AC

## 2020-08-01 MED ORDER — PREDNISONE 10 MG PO TABS: ORAL_TABLET | ORAL | 0 refills | Status: DC

## 2020-08-01 MED ORDER — LORATADINE 10 MG PO TABS: ORAL_TABLET | ORAL | 1 refills | Status: AC

## 2020-08-01 MED ORDER — BUDESONIDE 0.5 MG/2ML IN SUSP: RESPIRATORY_TRACT | 5 refills | Status: AC

## 2020-08-01 NOTE — Assessment & Plan Note (Addendum)
-   Patient underwent CPAP titration study 12/12/17, optimum pressure 14cm h20. She did not require supplement oxygen. Stopped wearing 8-9 months ago d/t possible contamination. Needs order for new CPAP machine > 46 years old. May need repeat sleep study d.t break in therapy. Follow-up 1-3 months with Dr. Halford Chessman.

## 2020-08-01 NOTE — Progress Notes (Signed)
Reviewed and agree with assessment/plan.   Chesley Mires, MD [facility] Pulmonary/Critical Care 08/01/2020, 4:55 PM Pager:  [phone]

## 2020-08-01 NOTE — Progress Notes (Signed)
@Patient  ID: Cheyenne Arias, female    DOB: [birth year], 45 y.o.   MRN: [id number]  Chief Complaint  Patient presents with  . Follow-up    Cough- runny nose.    Referring provider: [facility]  HPI: 45 year old female, never smoked.  Past medical history significant for hypertension, asthma, OSA, community-acquired pneumonia, fibromyalgia.  Patient of Dr. Halford Chessman, last seen in office on 11/25/2017. Patient underwent CPAP titration study 12/12/17, optimum pressure 14cm h20. She did not require supplement oxygen.   08/01/2020 Patient presents today for overdue follow-up. She was last seen in our office > 2.5 years ago. Today she showed up 15 mins late. Patient called our office on Thursday, April 14th for symptoms of productive cough with yellow mucus, wheezing, voice hoarseness, watery itchy eyes and nasal drainage for 3 to 4 days.  She was advised to present to urgent care if having acute symptoms as our office did not have any appointments available and we would not be able to prescribe any medications telephonically.   She saw UC Friday, prescribed zpack and prednisone. She reports improvement in wheezing. Continue to cough and and nasal congestion. Maintained on Pulmicort 0.5mg /27ml twice daily and duoneb q 4 hours as needed for shortness of breath/wheezing. She tells me that she ran out of her nebulized medication 1 week ago. She is overusing rescue inhaler. Taking Flonase and Claritin. Her PCP refilled her Singulair recently.    Pulmonary tests CT chest 09/22/11 >> no PE, Lt base ATX/ASD, borderline cardiomegaly Spirometry 01/10/12 >> FEV1 1.72 (71%), FEV1% 77 CT sinus 02/28/12 >> clear paranasal sinuses RAST 02/28/12 >> IgE 15.6, moderate reaction to [facility] CT chest 04/03/12 >> ASD superior segment LLL, RLL ATX PFT 12/17/12 >> FEV1 2.14 (95%), FEV1% 88, TLC 2.62 (58%), DLCO 96%, no BD  Sleep tests: PSG 03/27/11 >> AHI 10.9 CPAP titration 10/29/17 >>CPAP 7 cm H2O >>AHI 1.1.  Didn't need supplemental oxygen  Allergies  Allergen Reactions  . Cymbalta [Duloxetine Hcl] Other (See Comments)    Head felt cold and tight, loss of conciousness  . Latex Itching, Swelling and Other (See Comments)    Reaction:  Blisters   . Morphine And Related Itching    Immunization History  Administered Date(s) Administered  . Influenza Split 02/02/2013, 11/25/2014, 01/18/2017, 01/14/2018  . Influenza,inj,Quad PF,6+ Mos 02/02/2013, 01/18/2017  . Influenza,inj,Quad PF,6-35 Mos 01/06/2020  . Influenza,inj,quad, With Preservative 02/02/2013, 11/25/2014, 01/18/2017  . Influenza-Unspecified 03/23/2014  . Moderna Sars-Covid-2 Vaccination 08/18/2019, 09/15/2019  . Pneumococcal Conjugate-13 04/25/2018  . Pneumococcal Polysaccharide-23 04/06/2015  . Tdap 11/14/2017, 11/14/2017    Past Medical History:  Diagnosis Date  . Allergy    seasonal  . Anemia    goes to [facility], [facility]  . Anxiety   . Arthritis    spinal stenosis  . Asthma   . Bipolar disorder ([place])   . Cervical cancer ([place])   . Depression   . Fatty liver disease, nonalcoholic   . Fibromyalgia   . Food poisoning   . GERD (gastroesophageal reflux disease)   . 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 HSV infection   . Hypertension   . Lumbar radiculopathy   . Migraines   . Neuropathy   . Obesity   . OSA (obstructive sleep apnea)   . OSA on CPAP    uses it nightly  . Peripheral neuropathy    LLE - pins and needles  . Shortness of breath dyspnea    with  exertion  . Spinal stenosis     Tobacco History: Social History   Tobacco Use  Smoking Status Never Smoker  Smokeless Tobacco Never Used  Tobacco Comment   never   Counseling given: Not Answered Comment: never   Outpatient Medications Prior to Visit  Medication Sig Dispense Refill  . albuterol (PROAIR HFA) 108 (90 Base) MCG/ACT inhaler Inhale 2 puffs into the lungs every 6 (six) hours as needed for wheezing or shortness of breath. 1 Inhaler 0  .  amLODipine (NORVASC) 10 MG tablet Take 10 mg by mouth daily.    . ARIPiprazole (ABILIFY) 2 MG tablet Take 1 tablet (2 mg total) by mouth at bedtime. 30 tablet 2  . clonazePAM (KLONOPIN) 0.5 MG tablet TAKE 1 TABLET TWICE A DAY AS NEEDED FOR ANXIETY 60 tablet 0  . FLUoxetine (PROZAC) 20 MG capsule Take 1 capsule (20 mg total) by mouth daily. 30 capsule 2  . fluticasone (FLONASE) 50 MCG/ACT nasal spray PLACE 2 SPRAYS INTO BOTH NOSTRILS 2 (TWO) TIMES DAILY. 16 g 5  . furosemide (LASIX) 80 MG tablet Take 80 mg by mouth 2 (two) times daily.    [name] ibuprofen (ADVIL,MOTRIN) 800 MG tablet Take 800 mg by mouth 3 (three) times daily as needed for moderate pain.    . montelukast (SINGULAIR) 10 MG tablet TAKE 10 MG BY MOUTH AT BEDTIME 30 tablet 5  . Multiple Vitamins-Minerals (MULTIVITAMIN WITH MINERALS) tablet Take 1 tablet by mouth daily.    [name] oxyCODONE (ROXICODONE) 15 MG immediate release tablet Take 15 mg by mouth every 6 (six) hours as needed for pain.    . phenazopyridine (PYRIDIUM) 200 MG tablet Take 1 tablet (200 mg total) by mouth 3 (three) times daily as needed for pain. 6 tablet 0  . potassium chloride SA (K-DUR,KLOR-CON) 20 MEQ tablet Take 1 tablet (20 mEq total) by mouth daily. (Patient taking differently: Take 20 mEq by mouth 2 (two) times daily.) 7 tablet 0  . pregabalin (LYRICA) 300 MG capsule Take 300 mg by mouth 2 (two) times daily.     . rizatriptan (MAXALT) 10 MG tablet Take 10 mg by mouth as needed for migraine. May repeat in 2 hours if needed    . topiramate (TOPAMAX) 100 MG tablet Take 100 mg by mouth 2 (two) times daily.     [name] triamcinolone cream (KENALOG) 0.1 % Apply 1 application topically daily as needed (for rash).     . budesonide (PULMICORT) 0.5 MG/2ML nebulizer solution TAKE 2 MLS BY NEBULIZATION 2 TIMES DAILY. DX CODE: CHRONIC ASTHMA J45.909 120 mL 5  . ipratropium-albuterol (DUONEB) 0.5-2.5 (3) MG/3ML SOLN TAKE 3 MLS BY NEBULIZATION EVERY 4 HOURS AS NEEDED (FOR WHEEZING/SHORTNESS  OF BREATH). 360 mL 5  . loratadine (CLARITIN) 10 MG tablet TAKE 10 MG BY MOUTH at night 90 tablet 1  . cyclobenzaprine (FLEXERIL) 10 MG tablet Take 1 tablet (10 mg total) by mouth 2 (two) times daily as needed for muscle spasms. (Patient not taking: Reported on 08/01/2020) 20 tablet 0  . metoprolol tartrate (LOPRESSOR) 25 MG tablet Take 25 mg by mouth 2 (two) times daily.  (Patient not taking: Reported on 08/01/2020)    . ondansetron (ZOFRAN) 4 MG tablet Take 1 tablet (4 mg total) by mouth every 8 (eight) hours as needed for nausea or vomiting. (Patient not taking: Reported on 08/01/2020) 10 tablet 0  . OXYGEN Inhale 2 L into the lungs at bedtime. (Patient not taking: Reported on 08/01/2020)    .  pantoprazole (PROTONIX) 40 MG tablet Take 40 mg by mouth at night (Patient not taking: Reported on 08/01/2020) 90 tablet 3  . promethazine (PHENERGAN) 25 MG tablet Take 1 tablet (25 mg total) by mouth every 6 (six) hours as needed for nausea or vomiting. (Patient not taking: Reported on 08/01/2020) 30 tablet 0   No facility-administered medications prior to visit.   Review of Systems  Review of Systems  Constitutional: Negative.   Respiratory: Positive for cough and wheezing. Negative for chest tightness.   Cardiovascular: Negative.    Physical Exam  BP 116/80 (BP Location: Left Arm, Patient Position: Sitting, Cuff Size: Large)   Temp 98.9 F (37.2 C) (Temporal)   Ht 5\' 2"  (1.575 m)   Wt 281 lb 3.2 oz (127.6 kg)   BMI 51.43 kg/m  Physical Exam Constitutional:      Appearance: Normal appearance.  HENT:     Nose:     Right Turbinates: Swollen.     Left Turbinates: Swollen.     Mouth/Throat:     Mouth: Mucous membranes are moist.     Pharynx: Oropharynx is clear.     Tonsils: 2+ on the right. 2+ on the left.  Cardiovascular:     Rate and Rhythm: Normal rate and regular rhythm.  Pulmonary:     Effort: Pulmonary effort is normal.     Breath sounds: Wheezing present.  Neurological:      General: No focal deficit present.     Mental Status: She is alert and oriented to person, place, and time. Mental status is at baseline.  Psychiatric:        Mood and Affect: Mood normal.        Behavior: Behavior normal.        Thought Content: Thought content normal.        Judgment: Judgment normal.      Lab Results:  CBC    Component Value Date/Time   WBC 9.9 04/23/2020 0825   RBC 5.08 04/23/2020 0825   HGB 12.7 04/23/2020 0825   HGB 11.5 (L) 05/21/2017 1334   HGB 8.5 (L) 12/11/2016 1213   HCT 42.1 04/23/2020 0825   HCT 30.0 (L) 12/11/2016 1213   PLT 332 04/23/2020 0825   PLT 436 (H) 05/21/2017 1334   PLT 392 12/11/2016 1213   MCV 82.9 04/23/2020 0825   MCV 62.2 (L) 12/11/2016 1213   MCH 25.0 (L) 04/23/2020 0825   MCHC 30.2 04/23/2020 0825   RDW 16.8 (H) 04/23/2020 0825   RDW 18.3 (H) 12/11/2016 1213   LYMPHSABS 2.9 04/23/2020 0825   LYMPHSABS 2.5 12/11/2016 1213   MONOABS 0.6 04/23/2020 0825   MONOABS 0.5 12/11/2016 1213   EOSABS 0.2 04/23/2020 0825   EOSABS 0.2 12/11/2016 1213   BASOSABS 0.1 04/23/2020 0825   BASOSABS 0.0 12/11/2016 1213    BMET    Component Value Date/Time   NA 138 04/23/2020 0825   NA 139 08/29/2012 1102   K 4.4 04/23/2020 0825   K 3.2 (L) 08/29/2012 1102   CL 106 04/23/2020 0825   CL 105 08/29/2012 1102   CO2 24 04/23/2020 0825   CO2 23 08/29/2012 1102   GLUCOSE 103 (H) 04/23/2020 0825   GLUCOSE 130 (H) 08/29/2012 1102   BUN 12 04/23/2020 0825   BUN 5.8 (L) 08/29/2012 1102   CREATININE 1.01 (H) 04/23/2020 0825   CREATININE 0.8 08/29/2012 1102   CALCIUM 9.0 04/23/2020 0825   CALCIUM 8.6 08/29/2012 1102   GFRNONAA >60  04/23/2020 0825   GFRAA >60 03/10/2017 1735    BNP No results found for: BNP  ProBNP    Component Value Date/Time   PROBNP 6.1 01/06/2014 0800    Imaging: No results found.   Assessment & Plan:   Acute asthmatic bronchitis - Seen by urgent care 3 days ago for sinobronchitis and prescribed Zpack.  Continues to have cough with associated wheezing. Needs CXR today. Extending prednisone taper (40mg  x 3 days, 30mg  x 3 days, 20mg  x 3 days, 10mg  x 2 days). Resume Pulmicort 0.5mg /62ml nebulizer twice daily, Singulair 10mg  at bedtime, Loratidine 10mg  daily and prn Duoneb q 6 hours for sob/wheezing.   OSA (obstructive sleep apnea) - Patient underwent CPAP titration study 12/12/17, optimum pressure 14cm h20. She did not require supplement oxygen. Stopped wearing 8-9 months ago d/t possible contamination. Needs order for new CPAP machine > 66 years old. May need repeat sleep study d.t break in therapy. Follow-up 1-3 months with Dr. Halford Chessman.     Martyn Ehrich, NP 08/01/2020

## 2020-08-01 NOTE — Assessment & Plan Note (Addendum)
-   Seen by urgent care 3 days ago for sinobronchitis and prescribed Zpack. Continues to have cough with associated wheezing. Needs CXR today. Extending prednisone taper (40mg  x 3 days, 30mg  x 3 days, 20mg  x 3 days, 10mg  x 2 days). Resume Pulmicort 0.5mg /42ml nebulizer twice daily, Singulair 10mg  at bedtime, Loratidine 10mg  daily and prn Duoneb q 6 hours for sob/wheezing.

## 2020-08-01 NOTE — Patient Instructions (Addendum)
  Recommendations: - Resume Pulmicort (budesonide) nebulizer twice a day morning and evening  - Use ipratropium-albuterol (duoneb) every 6 hours as needed for wheezing  - Resume loratidine 10mg  daily  - Continue Flonase 1-2 sprays per each nostril once daily  - Resume Singulair 10mg  at bedtime  - Take Tessalon perles three times a day and delsym cough syrup twice a day for cough (coupon given)  Orders: - CXR today  - New CPAP machine @14  cm H2O with a Small size [name] mask and heated humidification  Rx: - Prednisone taper as prescribed (sent to local pharmacy and all other RX sent to mail in)  Follow-up: - First available in 1-3 months with Dr. Halford Chessman

## 2020-08-02 ENCOUNTER — Telehealth: Payer: Self-pay | Admitting: Primary Care

## 2020-08-02 NOTE — Telephone Encounter (Signed)
Chest xray was normal. No evidence of pneumonia

## 2020-08-02 NOTE — Telephone Encounter (Signed)
Pt is calling about her cxr results from 04/18.  EW please advise.  thanks

## 2020-08-02 NOTE — Telephone Encounter (Signed)
I have called and LM on VM for the pt to call back about her cxr results.

## 2020-08-04 NOTE — Telephone Encounter (Signed)
Lmtcb for pt.  If she does not return call, will send letter with results.

## 2020-08-05 ENCOUNTER — Encounter: Payer: Self-pay | Admitting: *Deleted

## 2020-08-05 NOTE — Telephone Encounter (Signed)
Spoke with patient to let her know about CXR results and advised her that she will probably get a letter in the mail in regards to it since we have been trying to get ahold of her. She also expressed that she had covid earlier in the year and is still having shortness of breath and cough. Advised that when she was seen by [facility] recently she wanted patient to follow up with Dr. Halford Chessman in 1-3 months. Patient has been scheduled for next month because she has not been seen by Dr. Halford Chessman since 2019. Nothing further needed at this time.

## 2020-08-05 NOTE — Telephone Encounter (Signed)
Attempted to call pt but unable to reach. Left message for her to return call. Due to multiple attempts trying to call pt to discuss results of her recent cxr with her and [place] unable to reach pt, letter will be sent to pt on the results as the cxr was normal and encounter will be closed.

## 2020-08-25 DIAGNOSIS — R569 Unspecified convulsions: Secondary | ICD-10-CM | POA: Insufficient documentation

## 2020-09-07 ENCOUNTER — Ambulatory Visit: Payer: 59 | Admitting: Pulmonary Disease

## 2021-08-25 ENCOUNTER — Observation Stay (HOSPITAL_BASED_OUTPATIENT_CLINIC_OR_DEPARTMENT_OTHER)
Admission: EM | Admit: 2021-08-25 | Discharge: 2021-08-26 | Disposition: A | Discharge status: Home / Self Care | Payer: 59 | Attending: Family Medicine | Admitting: Family Medicine

## 2021-08-25 ENCOUNTER — Encounter (HOSPITAL_BASED_OUTPATIENT_CLINIC_OR_DEPARTMENT_OTHER): Payer: Self-pay | Admitting: Emergency Medicine

## 2021-08-25 ENCOUNTER — Emergency Department (HOSPITAL_BASED_OUTPATIENT_CLINIC_OR_DEPARTMENT_OTHER): Payer: 59

## 2021-08-25 ENCOUNTER — Other Ambulatory Visit: Payer: Self-pay

## 2021-08-25 DIAGNOSIS — J45909 Unspecified asthma, uncomplicated: Secondary | ICD-10-CM | POA: Diagnosis present

## 2021-08-25 DIAGNOSIS — Z20822 Contact with and (suspected) exposure to covid-19: Secondary | ICD-10-CM | POA: Diagnosis not present

## 2021-08-25 DIAGNOSIS — M542 Cervicalgia: Secondary | ICD-10-CM | POA: Insufficient documentation

## 2021-08-25 DIAGNOSIS — M62838 Other muscle spasm: Secondary | ICD-10-CM | POA: Insufficient documentation

## 2021-08-25 DIAGNOSIS — M797 Fibromyalgia: Secondary | ICD-10-CM | POA: Diagnosis not present

## 2021-08-25 DIAGNOSIS — K224 Dyskinesia of esophagus: Secondary | ICD-10-CM

## 2021-08-25 DIAGNOSIS — Z79899 Other long term (current) drug therapy: Secondary | ICD-10-CM | POA: Insufficient documentation

## 2021-08-25 DIAGNOSIS — M549 Dorsalgia, unspecified: Secondary | ICD-10-CM | POA: Insufficient documentation

## 2021-08-25 DIAGNOSIS — I10 Essential (primary) hypertension: Secondary | ICD-10-CM | POA: Diagnosis not present

## 2021-08-25 DIAGNOSIS — F419 Anxiety disorder, unspecified: Secondary | ICD-10-CM | POA: Diagnosis not present

## 2021-08-25 DIAGNOSIS — R079 Chest pain, unspecified: Secondary | ICD-10-CM | POA: Diagnosis not present

## 2021-08-25 DIAGNOSIS — Z8249 Family history of ischemic heart disease and other diseases of the circulatory system: Secondary | ICD-10-CM | POA: Insufficient documentation

## 2021-08-25 DIAGNOSIS — J4520 Mild intermittent asthma, uncomplicated: Secondary | ICD-10-CM | POA: Insufficient documentation

## 2021-08-25 DIAGNOSIS — E66813 Obesity, class 3: Secondary | ICD-10-CM

## 2021-08-25 DIAGNOSIS — Z6841 Body Mass Index (BMI) 40.0 and over, adult: Secondary | ICD-10-CM | POA: Diagnosis not present

## 2021-08-25 DIAGNOSIS — Z8709 Personal history of other diseases of the respiratory system: Secondary | ICD-10-CM | POA: Diagnosis not present

## 2021-08-25 DIAGNOSIS — J4550 Severe persistent asthma, uncomplicated: Secondary | ICD-10-CM | POA: Insufficient documentation

## 2021-08-25 DIAGNOSIS — G4733 Obstructive sleep apnea (adult) (pediatric): Secondary | ICD-10-CM | POA: Diagnosis not present

## 2021-08-25 DIAGNOSIS — Z8541 Personal history of malignant neoplasm of cervix uteri: Secondary | ICD-10-CM | POA: Diagnosis not present

## 2021-08-25 DIAGNOSIS — R0609 Other forms of dyspnea: Secondary | ICD-10-CM | POA: Insufficient documentation

## 2021-08-25 DIAGNOSIS — Z91040 Latex allergy status: Secondary | ICD-10-CM | POA: Insufficient documentation

## 2021-08-25 DIAGNOSIS — R0781 Pleurodynia: Secondary | ICD-10-CM | POA: Insufficient documentation

## 2021-08-25 DIAGNOSIS — K219 Gastro-esophageal reflux disease without esophagitis: Secondary | ICD-10-CM | POA: Diagnosis not present

## 2021-08-25 DIAGNOSIS — E6609 Other obesity due to excess calories: Secondary | ICD-10-CM

## 2021-08-25 DIAGNOSIS — R0602 Shortness of breath: Secondary | ICD-10-CM | POA: Insufficient documentation

## 2021-08-25 LAB — COMPREHENSIVE METABOLIC PANEL
ALT: 14 U/L (ref 0–44)
AST: 18 U/L (ref 15–41)
Albumin: 4.2 g/dL (ref 3.5–5.0)
Alkaline Phosphatase: 67 U/L (ref 38–126)
Anion gap: 7 (ref 5–15)
BUN: 11 mg/dL (ref 6–20)
CO2: 26 mmol/L (ref 22–32)
Calcium: 9.2 mg/dL (ref 8.9–10.3)
Chloride: 105 mmol/L (ref 98–111)
Creatinine, Ser: 0.67 mg/dL (ref 0.44–1.00)
GFR, Estimated: >60 mL/min (ref >60)
Glucose, Bld: 109 mg/dL — ABNORMAL HIGH (ref 70–99)
Potassium: 4 mmol/L (ref 3.5–5.1)
Sodium: 138 mmol/L (ref 135–145)
Total Bilirubin: 0.6 mg/dL (ref 0.3–1.2)
Total Protein: 8.1 g/dL (ref 6.5–8.1)

## 2021-08-25 LAB — TROPONIN I (HIGH SENSITIVITY)
Troponin I (High Sensitivity): <2 ng/L (ref <18)
Troponin I (High Sensitivity): <2 ng/L (ref <18)

## 2021-08-25 LAB — CBC
HCT: 40.2 % (ref 36.0–46.0)
HCT: 42 % (ref 36.0–46.0)
Hemoglobin: 12.8 g/dL (ref 12.0–15.0)
Hemoglobin: 13.2 g/dL (ref 12.0–15.0)
MCH: 25.2 pg — ABNORMAL LOW (ref 26.0–34.0)
MCH: 25 pg — ABNORMAL LOW (ref 26.0–34.0)
MCHC: 31.8 g/dL (ref 30.0–36.0)
MCHC: 31.4 g/dL (ref 30.0–36.0)
MCV: 79.3 fL — ABNORMAL LOW (ref 80.0–100.0)
MCV: 79.5 fL — ABNORMAL LOW (ref 80.0–100.0)
Platelets: 408 10*3/uL — ABNORMAL HIGH (ref 150–400)
Platelets: 390 10*3/uL (ref 150–400)
RBC: 5.07 MIL/uL (ref 3.87–5.11)
RBC: 5.28 MIL/uL — ABNORMAL HIGH (ref 3.87–5.11)
RDW: 15.2 % (ref 11.5–15.5)
RDW: 15 % (ref 11.5–15.5)
WBC: 11.2 10*3/uL — ABNORMAL HIGH (ref 4.0–10.5)
WBC: 12.3 10*3/uL — ABNORMAL HIGH (ref 4.0–10.5)
nRBC: 0 % (ref 0.0–0.2)
nRBC: 0 % (ref 0.0–0.2)

## 2021-08-25 LAB — HIV ANTIBODY (ROUTINE TESTING W REFLEX): HIV Screen 4th Generation wRfx: NONREACTIVE

## 2021-08-25 LAB — TSH: TSH: 1.257 u[IU]/mL (ref 0.350–4.500)

## 2021-08-25 LAB — MAGNESIUM: Magnesium: 1.8 mg/dL (ref 1.7–2.4)

## 2021-08-25 LAB — D-DIMER, QUANTITATIVE: D-Dimer, Quant: 0.38 ug/mL-FEU (ref 0.00–0.50)

## 2021-08-25 LAB — RESP PANEL BY RT-PCR (FLU A&B, COVID) ARPGX2
Influenza A by PCR: NEGATIVE
Influenza B by PCR: NEGATIVE
SARS Coronavirus 2 by RT PCR: NEGATIVE

## 2021-08-25 LAB — BRAIN NATRIURETIC PEPTIDE: B Natriuretic Peptide: 15.1 pg/mL (ref 0.0–100.0)

## 2021-08-25 LAB — CREATININE, SERUM
Creatinine, Ser: 0.65 mg/dL (ref 0.44–1.00)
GFR, Estimated: >60 mL/min (ref >60)

## 2021-08-25 MED ORDER — ALUM & MAG HYDROXIDE-SIMETH 200-200-20 MG/5ML PO SUSP: 30.0000 mL | Freq: Once | ORAL | Status: DC

## 2021-08-25 MED ORDER — CYCLOBENZAPRINE HCL 10 MG PO TABS
10.0000 mg | ORAL_TABLET | Freq: Two times a day (BID) | ORAL | Status: DC | PRN
  Administered 2021-08-25: 10 mg via ORAL
  Filled 2021-08-25: qty 1

## 2021-08-25 MED ORDER — FLUOXETINE HCL 20 MG PO CAPS
20.0000 mg | ORAL_CAPSULE | Freq: Every day | ORAL | Status: DC
  Filled 2021-08-25: qty 1

## 2021-08-25 MED ORDER — NITROGLYCERIN 0.4 MG SL SUBL
0.4000 mg | SUBLINGUAL_TABLET | SUBLINGUAL | Status: DC | PRN
  Administered 2021-08-25 (×2): 0.4 mg via SUBLINGUAL

## 2021-08-25 MED ORDER — MONTELUKAST SODIUM 10 MG PO TABS
10.0000 mg | ORAL_TABLET | Freq: Every day | ORAL | Status: DC
  Administered 2021-08-25: 10 mg via ORAL
  Filled 2021-08-25: qty 1

## 2021-08-25 MED ORDER — ONDANSETRON HCL 4 MG/2ML IJ SOLN
4.0000 mg | Freq: Four times a day (QID) | INTRAMUSCULAR | Status: DC | PRN
  Administered 2021-08-25: 4 mg via INTRAVENOUS
  Filled 2021-08-25: qty 2

## 2021-08-25 MED ORDER — ACETAMINOPHEN 325 MG PO TABS
650.0000 mg | ORAL_TABLET | Freq: Four times a day (QID) | ORAL | Status: DC | PRN
  Administered 2021-08-25: 650 mg via ORAL
  Filled 2021-08-25: qty 2

## 2021-08-25 MED ORDER — ASPIRIN 81 MG PO CHEW
324.0000 mg | CHEWABLE_TABLET | Freq: Once | ORAL | Status: AC
  Administered 2021-08-25: 324 mg via ORAL
  Filled 2021-08-25: qty 4

## 2021-08-25 MED ORDER — PREGABALIN 100 MG PO CAPS
300.0000 mg | ORAL_CAPSULE | Freq: Two times a day (BID) | ORAL | Status: DC
  Administered 2021-08-25 – 2021-08-26 (×2): 300 mg via ORAL
  Filled 2021-08-25 (×3): qty 3

## 2021-08-25 MED ORDER — SODIUM CHLORIDE 0.9 % IV SOLN
25.0000 mg | Freq: Four times a day (QID) | INTRAVENOUS | Status: DC | PRN
  Filled 2021-08-25 (×2): qty 1

## 2021-08-25 MED ORDER — NITROGLYCERIN 0.4 MG SL SUBL
0.4000 mg | SUBLINGUAL_TABLET | SUBLINGUAL | Status: DC | PRN
  Administered 2021-08-25 (×2): 0.4 mg via SUBLINGUAL
  Filled 2021-08-25 (×3): qty 1

## 2021-08-25 MED ORDER — IPRATROPIUM-ALBUTEROL 0.5-2.5 (3) MG/3ML IN SOLN: 3.0000 mL | Freq: Four times a day (QID) | RESPIRATORY_TRACT | Status: DC | PRN

## 2021-08-25 MED ORDER — ALUM & MAG HYDROXIDE-SIMETH 200-200-20 MG/5ML PO SUSP
30.0000 mL | Freq: Once | ORAL | Status: AC
  Administered 2021-08-25: 30 mL via ORAL
  Filled 2021-08-25: qty 30

## 2021-08-25 MED ORDER — BUDESONIDE 0.5 MG/2ML IN SUSP
0.5000 mg | Freq: Every day | RESPIRATORY_TRACT | Status: DC
  Administered 2021-08-26: 0.5 mg via RESPIRATORY_TRACT
  Filled 2021-08-25: qty 2

## 2021-08-25 MED ORDER — LIDOCAINE VISCOUS HCL 2 % MT SOLN
15.0000 mL | Freq: Once | OROMUCOSAL | Status: AC
  Administered 2021-08-25: 15 mL via ORAL
  Filled 2021-08-25 (×2): qty 15

## 2021-08-25 MED ORDER — ACETAMINOPHEN 650 MG RE SUPP: 650.0000 mg | Freq: Four times a day (QID) | RECTAL | Status: DC | PRN

## 2021-08-25 MED ORDER — FUROSEMIDE 40 MG PO TABS
80.0000 mg | ORAL_TABLET | Freq: Two times a day (BID) | ORAL | Status: DC
  Filled 2021-08-25 (×2): qty 2

## 2021-08-25 MED ORDER — BENZONATATE 100 MG PO CAPS
100.0000 mg | ORAL_CAPSULE | Freq: Three times a day (TID) | ORAL | Status: DC
  Filled 2021-08-25: qty 1

## 2021-08-25 MED ORDER — ONDANSETRON HCL 4 MG PO TABS: 4.0000 mg | ORAL_TABLET | Freq: Four times a day (QID) | ORAL | Status: DC | PRN

## 2021-08-25 MED ORDER — ENOXAPARIN SODIUM 40 MG/0.4ML IJ SOSY
40.0000 mg | PREFILLED_SYRINGE | INTRAMUSCULAR | Status: DC
  Filled 2021-08-25: qty 0.4

## 2021-08-25 MED ORDER — CLONAZEPAM 0.5 MG PO TABS
0.5000 mg | ORAL_TABLET | Freq: Two times a day (BID) | ORAL | Status: DC | PRN
  Filled 2021-08-25: qty 1

## 2021-08-25 MED ORDER — TOPIRAMATE 100 MG PO TABS
100.0000 mg | ORAL_TABLET | Freq: Two times a day (BID) | ORAL | Status: DC
  Filled 2021-08-25 (×3): qty 1

## 2021-08-25 MED ORDER — AMLODIPINE BESYLATE 10 MG PO TABS
10.0000 mg | ORAL_TABLET | Freq: Every day | ORAL | Status: DC
  Filled 2021-08-25: qty 1

## 2021-08-25 MED ORDER — METOPROLOL TARTRATE 25 MG PO TABS
25.0000 mg | ORAL_TABLET | Freq: Two times a day (BID) | ORAL | Status: DC
  Filled 2021-08-25: qty 1

## 2021-08-25 MED ORDER — ALBUTEROL SULFATE HFA 108 (90 BASE) MCG/ACT IN AERS: 2.0000 | INHALATION_SPRAY | Freq: Four times a day (QID) | RESPIRATORY_TRACT | Status: DC | PRN

## 2021-08-25 MED ORDER — PANTOPRAZOLE SODIUM 40 MG IV SOLR
40.0000 mg | Freq: Two times a day (BID) | INTRAVENOUS | Status: DC
Start: 2021-08-25 — End: 2021-08-26
  Administered 2021-08-25 – 2021-08-26 (×2): 40 mg via INTRAVENOUS
  Filled 2021-08-25 (×2): qty 10

## 2021-08-25 MED ORDER — ARIPIPRAZOLE 2 MG PO TABS
2.0000 mg | ORAL_TABLET | Freq: Every day | ORAL | Status: DC
Start: 2021-08-25 — End: 2021-08-26
  Filled 2021-08-25 (×2): qty 1

## 2021-08-25 NOTE — ED Provider Notes (Signed)
?[facility] ?Provider Note ? ? ?CSN: [id number] ?Arrival date & time: 08/25/21  6948 ? ?  ? ?History ?Chief Complaint  ?Patient presents with  ? Rib pain  ? ? ?Cheyenne Arias is a 46 y.o. female. ? ?Patient presents with 4 day history of bilateral rib pain that feels like a tight corset is wrapped around her. The pain has been constant since it started but gets better at times. She cannot identify any aggravating factors but relieving factors include her muscle relaxer. Describes the pain as tight, squeezing sensation that feels like muscle spasms at times. She has never had this pain before. Has followed up with a pulmonologist in the past due to having a collapsed lung. Denies fever, chills, nausea, vomiting, recent illness and abdominal. Denies chest pain but endorses muscle spasms that also go down her left arm. Endorses associated dyspnea when she takes a deep breath, sometimes the pain gets worse. Sometimes has dyspnea with exertion.  ? ?The history is provided by the patient. No language interpreter was used.  ? ?  ? ?Home Medications ?Prior to Admission medications   ?Medication Sig Start Date End Date Taking? Authorizing Provider  ?albuterol (PROAIR HFA) 108 (90 Base) MCG/ACT inhaler Inhale 2 puffs into the lungs every 6 (six) hours as needed for wheezing or shortness of breath. 07/22/18   Chesley Mires, MD  ?amLODipine (NORVASC) 10 MG tablet Take 10 mg by mouth daily.    [provider]  ?ARIPiprazole (ABILIFY) 2 MG tablet Take 1 tablet (2 mg total) by mouth at bedtime. 05/08/17   Merian Capron, MD  ?benzonatate (TESSALON) 100 MG capsule Take 1 capsule (100 mg total) by mouth 3 (three) times daily. 08/01/20   Martyn Ehrich, NP  ?budesonide (PULMICORT) 0.5 MG/2ML nebulizer solution TAKE 2 MLS BY NEBULIZATION 2 TIMES DAILY. DX CODE: CHRONIC ASTHMA J45.909 08/01/20   Martyn Ehrich, NP  ?clonazePAM (KLONOPIN) 0.5 MG tablet TAKE 1 TABLET TWICE A DAY AS NEEDED FOR  ANXIETY 07/12/17   Merian Capron, MD  ?cyclobenzaprine (FLEXERIL) 10 MG tablet Take 1 tablet (10 mg total) by mouth 2 (two) times daily as needed for muscle spasms. ?Patient not taking: Reported on 08/01/2020 04/23/20   Gareth Morgan, MD  ?FLUoxetine (PROZAC) 20 MG capsule Take 1 capsule (20 mg total) by mouth daily. 05/08/17   Merian Capron, MD  ?fluticasone (FLONASE) 50 MCG/ACT nasal spray PLACE 2 SPRAYS INTO BOTH NOSTRILS 2 (TWO) TIMES DAILY. 04/22/17   Chesley Mires, MD  ?furosemide (LASIX) 80 MG tablet Take 80 mg by mouth 2 (two) times daily.    [provider]  ?ibuprofen (ADVIL,MOTRIN) 800 MG tablet Take 800 mg by mouth 3 (three) times daily as needed for moderate pain.    [provider]  ?ipratropium-albuterol (DUONEB) 0.5-2.5 (3) MG/3ML SOLN TAKE 3 MLS BY NEBULIZATION EVERY 4 HOURS AS NEEDED (FOR WHEEZING/SHORTNESS OF BREATH). 08/01/20   Martyn Ehrich, NP  ?loratadine (CLARITIN) 10 MG tablet TAKE 10 MG BY MOUTH at night 08/01/20   Martyn Ehrich, NP  ?metoprolol tartrate (LOPRESSOR) 25 MG tablet Take 25 mg by mouth 2 (two) times daily.  ?Patient not taking: Reported on 08/01/2020    [provider]  ?montelukast (SINGULAIR) 10 MG tablet TAKE 10 MG BY MOUTH AT BEDTIME 01/08/18   Chesley Mires, MD  ?Multiple Vitamins-Minerals (MULTIVITAMIN WITH MINERALS) tablet Take 1 tablet by mouth daily.    [provider]  ?ondansetron (ZOFRAN) 4 MG tablet  Take 1 tablet (4 mg total) by mouth every 8 (eight) hours as needed for nausea or vomiting. ?Patient not taking: Reported on 08/01/2020 02/13/17   Ena Dawley, MD  ?oxyCODONE (ROXICODONE) 15 MG immediate release tablet Take 15 mg by mouth every 6 (six) hours as needed for pain.    [provider]  ?OXYGEN Inhale 2 L into the lungs at bedtime. ?Patient not taking: Reported on 08/01/2020    [provider]  ?pantoprazole (PROTONIX) 40 MG tablet Take 40 mg by mouth at night ?Patient not taking: Reported on  08/01/2020 09/06/17   Chesley Mires, MD  ?phenazopyridine (PYRIDIUM) 200 MG tablet Take 1 tablet (200 mg total) by mouth 3 (three) times daily as needed for pain. 02/07/19   Little, Wenda Overland, MD  ?potassium chloride SA (K-DUR,KLOR-CON) 20 MEQ tablet Take 1 tablet (20 mEq total) by mouth daily. ?Patient taking differently: Take 20 mEq by mouth 2 (two) times daily. 08/06/16   Tresea Mall, CNM  ?predniSONE (DELTASONE) 10 MG tablet Take 4 tabs po daily x 3 days; then 3 tabs daily x3 days; then 2 tabs daily x3 days; then 1 tab daily x 3 days; then stop 08/01/20   Martyn Ehrich, NP  ?pregabalin (LYRICA) 300 MG capsule Take 300 mg by mouth 2 (two) times daily.     [provider]  ?promethazine (PHENERGAN) 25 MG tablet Take 1 tablet (25 mg total) by mouth every 6 (six) hours as needed for nausea or vomiting. ?Patient not taking: Reported on 08/01/2020 04/23/20   Gareth Morgan, MD  ?rizatriptan (MAXALT) 10 MG tablet Take 10 mg by mouth as needed for migraine. May repeat in 2 hours if needed    [provider]  ?topiramate (TOPAMAX) 100 MG tablet Take 100 mg by mouth 2 (two) times daily.     [provider]  ?triamcinolone cream (KENALOG) 0.1 % Apply 1 application topically daily as needed (for rash).  07/23/16   [provider]  ?zolpidem (AMBIEN) 5 MG tablet Take 5 mg by mouth at bedtime as needed for sleep.   05/10/14  [provider]  ?   ? ?Allergies    ?Cymbalta [duloxetine hcl], Latex, and Morphine and related   ? ?Review of Systems   ?Review of Systems  ?Constitutional:  Negative for activity change, appetite change, chills, fatigue and fever.  ?HENT:  Negative for congestion.   ?Respiratory:  Positive for shortness of breath. Negative for cough and chest tightness.   ?Cardiovascular:  Negative for chest pain, palpitations and leg swelling.  ?Gastrointestinal:  Negative for abdominal pain, diarrhea, nausea and vomiting.  ?Musculoskeletal:  Positive for back pain.   ?Neurological:  Negative for dizziness, weakness and headaches.  ?Psychiatric/Behavioral:  Negative for confusion.   ? ?Physical Exam ?Updated Vital Signs ?BP (!) 145/87   Pulse 76   Temp 98.5 ?F (36.9 ?C) (Oral)   Resp (!) 21   Ht '5\' 2"'$  (1.575 m)   Wt 122.5 kg   SpO2 99%   BMI 49.38 kg/m?  ?Physical Exam ?Vitals reviewed.  ?Constitutional:   ?   General: She is not in acute distress. ?   Appearance: Normal appearance. She is not toxic-appearing.  ?HENT:  ?   Head: Normocephalic and atraumatic.  ?   Right Ear: External ear normal.  ?   Left Ear: External ear normal.  ?   Nose: Nose normal.  ?Eyes:  ?   General: No scleral icterus.    ?  Right eye: No discharge.     ?   Left eye: No discharge.  ?   Conjunctiva/sclera: Conjunctivae normal.  ?Cardiovascular:  ?   Rate and Rhythm: Normal rate and regular rhythm.  ?   Pulses: Normal pulses.  ?   Heart sounds: Normal heart sounds. No murmur heard. ?  No gallop.  ?Pulmonary:  ?   Effort: Pulmonary effort is normal. No respiratory distress.  ?   Breath sounds: Normal breath sounds. No wheezing or rales.  ?Chest:  ?   Chest wall: No tenderness.  ?Abdominal:  ?   General: Bowel sounds are normal.  ?   Palpations: Abdomen is soft.  ?   Tenderness: There is no abdominal tenderness. There is no right CVA tenderness or left CVA tenderness.  ?Musculoskeletal:     ?   General: Tenderness (tenderness at and directly below xiphoid process laterally wrapped posteriorly without erythema or edema noted) present. No swelling.  ?   Cervical back: No tenderness.  ?   Right lower leg: No edema.  ?   Left lower leg: No edema.  ?Lymphadenopathy:  ?   Cervical: No cervical adenopathy.  ?Skin: ?   General: Skin is warm and dry.  ?   Capillary Refill: Capillary refill takes less than 2 seconds.  ?   Findings: No bruising, lesion or rash.  ?Neurological:  ?   General: No focal deficit present.  ?   Mental Status: She is alert. Mental status is at baseline.  ?   Sensory: No sensory  deficit.  ?   Motor: No weakness.  ?Psychiatric:     ?   Mood and Affect: Mood normal.     ?   Behavior: Behavior normal.  ? ? ?ED Results / Procedures / Treatments   ?Labs ?(all labs ordered are listed, but only abnormal re

## 2021-08-25 NOTE — ED Triage Notes (Signed)
Pt c/o bilateral rib pain, intermittent squeezing x 1 week. Pt states she cannot lay down. Denies chest pain, some SOB when lay down. H/O Fibromylagia and spinal stenosis.  ?

## 2021-08-25 NOTE — ED Notes (Addendum)
All valuables kept, purse, cell phone with charger, wallet, gold colored hoop earrings, tank shirt, pants , sandals. Informed will be responsible for all valuables kept on person. Verbalized understanding. Report given to Shanon Brow with [facility] ?

## 2021-08-25 NOTE — ED Notes (Signed)
[facility] here to take patient. ?

## 2021-08-25 NOTE — H&P (Signed)
?History and Physical  ? ? ?[name] BSJ:[id number] DOB: 24-May-1975 DOA: 08/25/2021 ? ?PCP: [facility]  ?Patient coming from: Home ? ?I have personally briefly reviewed patient's old medical records in [place] ? ?Chief Complaint: Chest pain ? ?HPI: Cheyenne Arias is a 46 y.o. female with medical history significant of anxiety, fibromyalgia, morbid obesity, hypertension, asthma and multiple other comorbidities presented to [facility] with complaint of intermittent chest pain for last 4 days.  She describes her pain mostly starting on the side and it wraps her chest all around, spasmodic, sometimes radiating to the neck.  Aggravated with upper body movement.  No relieving factor.  She cannot recall if the pain has to do anything with the food intake.  Denies any shortness of breath, diaphoresis, nausea, dizziness, palpitation or any other complaint.  No sick contact and no recent history of travel.  No prior history of thromboembolism. ? ?ED Course: Upon arrival to ED, her blood pressure was fluctuating but fairly acceptable.  Rest of the labs were fairly normal as well.  She was given nitroglycerin which helped with the pain some.  She rates her pain at 9-10 upon presentation to the ED and currently her pain is only 3-4.  She is already ruled out of ACS in the ED. ? ?Review of Systems: As per HPI otherwise negative.  ? ? ?Past Medical History:  ?Diagnosis Date  ? Allergy   ? seasonal  ? Anemia   ? goes to [facility], ferahem  ? Anxiety   ? Arthritis   ? spinal stenosis  ? Asthma   ? Bipolar disorder ([place])   ? Cervical cancer ([place])   ? Depression   ? Fatty liver disease, nonalcoholic   ? Fibromyalgia   ? Food poisoning   ? GERD (gastroesophageal reflux disease)   ? Hepatitis   ? type A from food poisoning  ? HSV infection   ? Hypertension   ? Lumbar radiculopathy   ? Migraines   ? Neuropathy   ? Obesity   ? OSA (obstructive sleep apnea)   ? OSA on CPAP   ? uses it nightly  ?  Peripheral neuropathy   ? LLE - pins and needles  ? Shortness of breath dyspnea   ? with exertion  ? Spinal stenosis   ? ? ?Past Surgical History:  ?Procedure Laterality Date  ? CARPAL TUNNEL RELEASE Right   ? [place], 2013  ? x 3  ? CESAREAN SECTION  11/28/2011  ? Procedure: CESAREAN SECTION;  Surgeon: Woodroe Mode, MD;  Location: [facility];  Service: Obstetrics;  Laterality: N/A;  ? COLONOSCOPY N/A 04/01/2017  ? Procedure: COLONOSCOPY;  Surgeon: Ladene Artist, MD;  Location: [facility];  Service: Endoscopy;  Laterality: N/A;  ? COLPOSCOPY    ? DILITATION & CURRETTAGE/HYSTROSCOPY WITH NOVASURE ABLATION N/A 02/13/2017  ? Procedure: DILATATION & CURETTAGE/HYSTEROSCOPY WITH NOVASURE ABLATION;  Surgeon: Ena Dawley, MD;  Location: [facility];  Service: Gynecology;  Laterality: N/A;  ? FACET JOINT INJECTION    ? multiple injections  ? HYSTEROSCOPY WITH D & C N/A 08/17/2015  ? Procedure: DILATATION AND CURETTAGE /HYSTEROSCOPY;  Surgeon: Ena Dawley, MD;  Location: [facility];  Service: Gynecology;  Laterality: N/A;  ? MANDIBLE SURGERY  2008  ? WISDOM TOOTH EXTRACTION    ? ? ? reports that she has never smoked. She has never used smokeless tobacco. She reports that she does not drink  alcohol and does not use drugs. ? ?Allergies  ?Allergen Reactions  ? Duloxetine Hcl Other (See Comments)  ?  Head felt cold and tight, loss of consciousness, and a Near-Syncopal reaction- light-headed and dizzy  ? Latex Hives, Itching, Swelling and Other (See Comments)  ?  Blisters, also ?  ? Tree Extract Other (See Comments)  ?  All trees and shrubs = Severe "Hay Fever" symptoms  ? Morphine And Related Itching  ? ? ?Family History  ?Problem Relation Age of Onset  ? Hypertension Mother   ? Sickle cell trait Maternal Aunt   ? Sickle cell anemia Other   ? Schizophrenia Son   ? Depression Daughter   ? Kidney disease Father   ?     transplant required  ? Stomach cancer Neg Hx   ? Colon cancer Neg Hx   ? ? ?Prior to  Admission medications   ?Medication Sig Start Date End Date Taking? Authorizing Provider  ?albuterol (PROAIR HFA) 108 (90 Base) MCG/ACT inhaler Inhale 2 puffs into the lungs every 6 (six) hours as needed for wheezing or shortness of breath. 07/22/18   Chesley Mires, MD  ?amLODipine (NORVASC) 10 MG tablet Take 10 mg by mouth daily.    [provider]  ?ARIPiprazole (ABILIFY) 2 MG tablet Take 1 tablet (2 mg total) by mouth at bedtime. 05/08/17   Merian Capron, MD  ?benzonatate (TESSALON) 100 MG capsule Take 1 capsule (100 mg total) by mouth 3 (three) times daily. 08/01/20   Martyn Ehrich, NP  ?budesonide (PULMICORT) 0.5 MG/2ML nebulizer solution TAKE 2 MLS BY NEBULIZATION 2 TIMES DAILY. DX CODE: CHRONIC ASTHMA J45.909 08/01/20   Martyn Ehrich, NP  ?clonazePAM (KLONOPIN) 0.5 MG tablet TAKE 1 TABLET TWICE A DAY AS NEEDED FOR ANXIETY ?Patient taking differently: Take 0.5 mg by mouth 2 (two) times daily as needed for anxiety. 07/12/17   Merian Capron, MD  ?cyclobenzaprine (FLEXERIL) 10 MG tablet Take 1 tablet (10 mg total) by mouth 2 (two) times daily as needed for muscle spasms. 04/23/20   Gareth Morgan, MD  ?FLUoxetine (PROZAC) 20 MG capsule Take 1 capsule (20 mg total) by mouth daily. 05/08/17   Merian Capron, MD  ?fluticasone (FLONASE) 50 MCG/ACT nasal spray PLACE 2 SPRAYS INTO BOTH NOSTRILS 2 (TWO) TIMES DAILY. 04/22/17   Chesley Mires, MD  ?furosemide (LASIX) 80 MG tablet Take 80 mg by mouth 2 (two) times daily.    [provider]  ?ibuprofen (ADVIL,MOTRIN) 800 MG tablet Take 800 mg by mouth 3 (three) times daily as needed for moderate pain.    [provider]  ?ipratropium-albuterol (DUONEB) 0.5-2.5 (3) MG/3ML SOLN TAKE 3 MLS BY NEBULIZATION EVERY 4 HOURS AS NEEDED (FOR WHEEZING/SHORTNESS OF BREATH). 08/01/20   Martyn Ehrich, NP  ?loratadine (CLARITIN) 10 MG tablet TAKE 10 MG BY MOUTH at night 08/01/20   Martyn Ehrich, NP  ?metoprolol tartrate (LOPRESSOR) 25 MG tablet Take 25  mg by mouth 2 (two) times daily.    [provider]  ?montelukast (SINGULAIR) 10 MG tablet TAKE 10 MG BY MOUTH AT BEDTIME 01/08/18   Chesley Mires, MD  ?Multiple Vitamins-Minerals (MULTIVITAMIN WITH MINERALS) tablet Take 1 tablet by mouth daily.    [provider]  ?ondansetron (ZOFRAN) 4 MG tablet Take 1 tablet (4 mg total) by mouth every 8 (eight) hours as needed for nausea or vomiting. 02/13/17   Ena Dawley, MD  ?oxyCODONE (ROXICODONE) 15 MG immediate release tablet Take 15 mg by mouth  every 6 (six) hours as needed for pain.    [provider]  ?OXYGEN Inhale 2 L into the lungs at bedtime.    [provider]  ?pantoprazole (PROTONIX) 40 MG tablet Take 40 mg by mouth at night 09/06/17   Chesley Mires, MD  ?phenazopyridine (PYRIDIUM) 200 MG tablet Take 1 tablet (200 mg total) by mouth 3 (three) times daily as needed for pain. 02/07/19   Little, Wenda Overland, MD  ?potassium chloride SA (K-DUR,KLOR-CON) 20 MEQ tablet Take 1 tablet (20 mEq total) by mouth daily. ?Patient taking differently: Take 20 mEq by mouth 2 (two) times daily. 08/06/16   Tresea Mall, CNM  ?predniSONE (DELTASONE) 10 MG tablet Take 4 tabs po daily x 3 days; then 3 tabs daily x3 days; then 2 tabs daily x3 days; then 1 tab daily x 3 days; then stop 08/01/20   Martyn Ehrich, NP  ?pregabalin (LYRICA) 300 MG capsule Take 300 mg by mouth 2 (two) times daily.     [provider]  ?promethazine (PHENERGAN) 25 MG tablet Take 1 tablet (25 mg total) by mouth every 6 (six) hours as needed for nausea or vomiting. 04/23/20   Gareth Morgan, MD  ?rizatriptan (MAXALT) 10 MG tablet Take 10 mg by mouth as needed for migraine. May repeat in 2 hours if needed    [provider]  ?topiramate (TOPAMAX) 100 MG tablet Take 100 mg by mouth 2 (two) times daily.     [provider]  ?triamcinolone cream (KENALOG) 0.1 % Apply 1 application topically daily as needed (for rash).  07/23/16   [provider]  ?zolpidem (AMBIEN) 5 MG tablet Take 5 mg by mouth at bedtime as needed for sleep.   05/10/14  [provider]  ? ? ?Physical Exam: ?Vitals:  ? 08/25/21 1430 08/25/21 1436 08/25/21 1440 08/25/21 1553

## 2021-08-26 ENCOUNTER — Other Ambulatory Visit (HOSPITAL_COMMUNITY): Payer: 59

## 2021-08-26 DIAGNOSIS — R079 Chest pain, unspecified: Secondary | ICD-10-CM | POA: Diagnosis not present

## 2021-08-26 LAB — BASIC METABOLIC PANEL
Anion gap: 7 (ref 5–15)
BUN: 7 mg/dL (ref 6–20)
CO2: 26 mmol/L (ref 22–32)
Calcium: 9.3 mg/dL (ref 8.9–10.3)
Chloride: 103 mmol/L (ref 98–111)
Creatinine, Ser: 0.69 mg/dL (ref 0.44–1.00)
GFR, Estimated: >60 mL/min (ref >60)
Glucose, Bld: 100 mg/dL — ABNORMAL HIGH (ref 70–99)
Potassium: 3.5 mmol/L (ref 3.5–5.1)
Sodium: 136 mmol/L (ref 135–145)

## 2021-08-26 LAB — CBC
HCT: 38.5 % (ref 36.0–46.0)
Hemoglobin: 12.3 g/dL (ref 12.0–15.0)
MCH: 25.1 pg — ABNORMAL LOW (ref 26.0–34.0)
MCHC: 31.9 g/dL (ref 30.0–36.0)
MCV: 78.6 fL — ABNORMAL LOW (ref 80.0–100.0)
Platelets: 387 10*3/uL (ref 150–400)
RBC: 4.9 MIL/uL (ref 3.87–5.11)
RDW: 14.9 % (ref 11.5–15.5)
WBC: 11.4 10*3/uL — ABNORMAL HIGH (ref 4.0–10.5)
nRBC: 0 % (ref 0.0–0.2)

## 2021-08-26 MED ORDER — VALACYCLOVIR HCL 500 MG PO TABS
500.0000 mg | ORAL_TABLET | Freq: Every day | ORAL | Status: DC
  Filled 2021-08-26: qty 1

## 2021-08-26 MED ORDER — LISINOPRIL 20 MG PO TABS
40.0000 mg | ORAL_TABLET | Freq: Every day | ORAL | Status: DC
  Administered 2021-08-26: 40 mg via ORAL
  Filled 2021-08-26 (×2): qty 2

## 2021-08-26 MED ORDER — MAALOX MAX 400-400-40 MG/5ML PO SUSP: 10.0000 mL | Freq: Four times a day (QID) | ORAL | 0 refills | Status: DC | PRN

## 2021-08-26 MED ORDER — PANTOPRAZOLE SODIUM 40 MG PO TBEC: 40.0000 mg | DELAYED_RELEASE_TABLET | Freq: Two times a day (BID) | ORAL | 0 refills | Status: AC

## 2021-08-26 NOTE — Discharge Summary (Signed)
PatientPhysician Discharge Summary  ?Cheyenne Arias WUJ:[id number] DOB: [birth year] DOA: 08/25/2021 ? ?PCP: [facility] ? ?Admit date: 08/25/2021 ?Discharge date: 08/26/2021 ? ? ? ?Admitted From: Home ?Disposition: Home ? ?Recommendations for Outpatient Follow-up:  ?Follow up with PCP in 1-2 weeks ?Please obtain BMP/CBC in one week ?Please follow up with your PCP on the following pending results: ?Unresulted Labs (From admission, onward)  ? ?  Start     Ordered  ? 09/01/21 0500  Creatinine, serum  (enoxaparin (LOVENOX)    CrCl >/= 30 ml/min)  Weekly,   R     ?Comments: while on enoxaparin therapy ?  ? 08/25/21 1647  ? ?  ?  ? ?  ?  ? ? ?Home Health: None ?Equipment/Devices: None ? ?Discharge Condition: Stable ?CODE STATUS: Full code ?Diet recommendation: Cardiac ? ?Subjective: Seen and examined.  She feels much better.  She is ready to go home. ? ?Brief/Interim Summary: Cheyenne Arias is a 46 y.o. female with medical history significant of anxiety, fibromyalgia, morbid obesity, hypertension, asthma and multiple other comorbidities presented to [facility] with complaint of intermittent chest pain for last 4 days.  She describes her pain mostly starting on the side and it wraps her chest all around, spasmodic, sometimes radiating to the neck.  Aggravated with upper body movement.  No relieving factor.  She cannot recall if the pain has to do anything with the food intake.  Denies any shortness of breath, diaphoresis, nausea, dizziness, palpitation or any other complaint.  No sick contact and no recent history of travel.  No prior history of thromboembolism. ?  ?Upon arrival to ED, her blood pressure was fluctuating but fairly acceptable.  Rest of the labs were fairly normal as well.  She was given nitroglycerin which helped with the pain some.  She rates her pain at 9-10 upon presentation to the ED prior to getting nitroglycerin and the pain improved to 3 out of 10 when she [facility] to the [facility].  D-dimer was negative which ruled out PE.  She was already ruled out of ACS with negative cardiac enzymes in the ED.  Her symptoms were consistent with possible GERD and lower esophagus spasm based on the history.  She drinks almost 7 cups of coffee a day, 1-2 sodas, likes to eat spicy food and fried food and she is obese.  She has all the risk factors.  After arrival to the [facility], I gave her GI cocktail and started on a Protonix and she felt significant improvement after that verifying the presumed diagnosis.  I have spent great length of time to educate her about dietary lifestyle modification to prevent GERD and educated her about weight loss.  I am prescribing her twice daily Protonix for 1 month.  If she does not get better, the neck step would be to start her on antispasmodics such as hyoscyamine or isosorbide dinitrate and possible calcium channel blocker.  She understands and verbalized everything. ? ?Discharge plan was discussed with patient and/or family member and they verbalized understanding and agreed with it.  ?Discharge Diagnoses:  ?Principal Problem: ?  Chest pain ?Active Problems: ?  Asthma, chronic ?  Fibromyalgia ?  Benign essential HTN ?  Diffuse esophageal spasm ?  Obesity, Class III, BMI 40-49.9 (morbid obesity) ([place]) ?  GERD (gastroesophageal reflux disease) ? ? ? ?Discharge Instructions ? ? ?Allergies as of 08/26/2021   ? ?   Reactions  ? Duloxetine Hcl Other (  See Comments)  ? Head felt cold and tight, loss of consciousness, and a Near-Syncopal reaction- light-headed and dizzy  ? Latex Hives, Itching, Swelling, Other (See Comments)  ? Blisters, also  ? Tree Extract Other (See Comments)  ? All trees and shrubs = Severe "Hay Fever" symptoms  ? Morphine And Related Itching  ? Tape Rash, Other (See Comments)  ? Certain medical tapes break out the skin- clear tape, especially  ? ?  ? ?  ?Medication List  ?  ? ?STOP taking these medications   ? ?amLODipine 10 MG tablet ?Commonly  known as: NORVASC ?  ?ARIPiprazole 2 MG tablet ?Commonly known as: ABILIFY ?  ?benzonatate 100 MG capsule ?Commonly known as: TESSALON ?  ?cyclobenzaprine 10 MG tablet ?Commonly known as: FLEXERIL ?  ?FLUoxetine 20 MG capsule ?Commonly known as: PROZAC ?  ?furosemide 80 MG tablet ?Commonly known as: LASIX ?  ?ibuprofen 800 MG tablet ?Commonly known as: ADVIL ?  ?metoprolol tartrate 25 MG tablet ?Commonly known as: LOPRESSOR ?  ?ondansetron 4 MG tablet ?Commonly known as: ZOFRAN ?  ?phenazopyridine 200 MG tablet ?Commonly known as: PYRIDIUM ?  ?potassium chloride SA 20 MEQ tablet ?Commonly known as: KLOR-CON M ?  ?predniSONE 10 MG tablet ?Commonly known as: DELTASONE ?  ?promethazine 25 MG tablet ?Commonly known as: PHENERGAN ?  ?topiramate 100 MG tablet ?Commonly known as: TOPAMAX ?  ? ?  ? ?TAKE these medications   ? ?Acetaminophen 500 MG capsule ?Take 500-1,000 mg by mouth every 6 (six) hours as needed for pain. ?  ?albuterol 108 (90 Base) MCG/ACT inhaler ?Commonly known as: ProAir HFA ?Inhale 2 puffs into the lungs every 6 (six) hours as needed for wheezing or shortness of breath. ?  ?budesonide 0.5 MG/2ML nebulizer solution ?Commonly known as: Pulmicort ?TAKE 2 MLS BY NEBULIZATION 2 TIMES DAILY. DX CODE: CHRONIC ASTHMA J45.909 ?What changed:  ?how much to take ?how to take this ?when to take this ?additional instructions ?  ?Centrum Women Tabs ?Take 1 tablet by mouth daily with breakfast. ?  ?clonazePAM 0.5 MG tablet ?Commonly known as: KLONOPIN ?TAKE 1 TABLET TWICE A DAY AS NEEDED FOR ANXIETY ?What changed: See the new instructions. ?  ?fluticasone 50 MCG/ACT nasal spray ?Commonly known as: FLONASE ?PLACE 2 SPRAYS INTO BOTH NOSTRILS 2 (TWO) TIMES DAILY. ?What changed:  ?how much to take ?how to take this ?when to take this ?additional instructions ?  ?ipratropium-albuterol 0.5-2.5 (3) MG/3ML Soln ?Commonly known as: DUONEB ?TAKE 3 MLS BY NEBULIZATION EVERY 4 HOURS AS NEEDED (FOR WHEEZING/SHORTNESS OF  BREATH). ?What changed:  ?how much to take ?how to take this ?when to take this ?reasons to take this ?additional instructions ?  ?lisinopril 40 MG tablet ?Commonly known as: ZESTRIL ?Take 40 mg by mouth daily. ?  ?loratadine 10 MG tablet ?Commonly known as: CLARITIN ?TAKE 10 MG BY MOUTH at night ?What changed:  ?how much to take ?how to take this ?when to take this ?additional instructions ?  ?Maalox Max 160-737-10 MG/5ML suspension ?Generic drug: alum & mag hydroxide-simeth ?Take 10 mLs by mouth every 6 (six) hours as needed for indigestion. ?  ?montelukast 10 MG tablet ?Commonly known as: SINGULAIR ?TAKE 10 MG BY MOUTH AT BEDTIME ?What changed:  ?how much to take ?how to take this ?when to take this ?additional instructions ?  ?nystatin powder ?Generic drug: nystatin ?Apply 1 application. topically 3 (three) times daily as needed (to affected areas). ?  ?ondansetron 8 MG disintegrating tablet ?  Commonly known as: ZOFRAN-ODT ?Take 8 mg by mouth every 8 (eight) hours as needed for nausea or vomiting (dissolve orally). ?  ?oxyCODONE 15 MG immediate release tablet ?Commonly known as: ROXICODONE ?Take 15 mg by mouth 4 (four) times daily. ?  ?Ozempic (0.25 or 0.5 MG/DOSE) 2 MG/1.5ML Sopn ?Generic drug: Semaglutide(0.25 or 0.'5MG'$ /DOS) ?Inject 0.25 mg into the skin every Wednesday. ?  ?pantoprazole 40 MG tablet ?Commonly known as: PROTONIX ?Take 1 tablet (40 mg total) by mouth 2 (two) times daily. Take 40 mg by mouth at night ?What changed:  ?how much to take ?how to take this ?when to take this ?Another medication with the same name was removed. Continue taking this medication, and follow the directions you see here. ?  ?pregabalin 300 MG capsule ?Commonly known as: LYRICA ?Take 300 mg by mouth in the morning and at bedtime. ?  ?PRESCRIPTION MEDICATION ?CPAP- Use during any time of rest ?  ?rizatriptan 10 MG tablet ?Commonly known as: MAXALT ?Take 10 mg by mouth See admin instructions. Take 10 mg by mouth at onset of  migraine and may repeat once in 2 hours, if no relief ?  ?tiZANidine 4 MG tablet ?Commonly known as: ZANAFLEX ?Take 4 mg by mouth 3 (three) times daily. ?  ?triamcinolone cream 0.1 % ?Commonly known as: KENALOG ?App

## 2021-08-26 NOTE — Progress Notes (Signed)
Vitals stable but still experiencing some back/chest pain now radiating to a headache. Stated GI Cocktail previously given did help a lot. Utilized PRN pain meds. Complaints that scheduled meds are not matching up to home medication list despite having med reconciliation with pharmacy. Also requested message sent to physician about possibility of being seen first thing in the morning to discharge immediately from hospital. Patient states she has a son with autism she needs to get to and that so far all her labs and workup have been negative with the only thing needing collection being the ECHO which she feels can be scheduled outpatient. Paged physician about med discrepancy and patient request. Patient refused several scheduled meds as they werent changed or seen as necessary. Also had episode of emesis and states feeling significantly better after. Now states believing her pain is more GI/GERD/gas related possibly from her recent start of a home medication.  ?

## 2021-08-26 NOTE — Progress Notes (Signed)
Nursing Dc note ? ?Patient alert and oriented, verbalized understanding of dc instructions. All papers and belongings given to patient. ?

## 2021-08-26 NOTE — Care Management Obs Status (Signed)
MEDICARE OBSERVATION STATUS NOTIFICATION ? ? ?Patient Details  ?Name: Cheyenne Arias ?MRN: [id number] ?Date of Birth: [birth year] ? ? ?Medicare Observation Status Notification Given:  Yes ? ? ? ?Charlee Whitebread G., RN ?08/26/2021, 11:35 AM ?

## 2021-12-12 DIAGNOSIS — J301 Allergic rhinitis due to pollen: Secondary | ICD-10-CM | POA: Insufficient documentation

## 2021-12-12 DIAGNOSIS — R87610 Atypical squamous cells of undetermined significance on cytologic smear of cervix (ASC-US): Secondary | ICD-10-CM | POA: Insufficient documentation

## 2022-06-01 ENCOUNTER — Telehealth: Payer: Self-pay

## 2022-06-01 NOTE — Telephone Encounter (Signed)
Returned her call requesting appt for IV iron. Instructed to have PCP send another referral. Last appt was 06/26/19 and she had multiple no shows. She will have PCP send referral

## 2022-06-11 ENCOUNTER — Telehealth: Payer: Self-pay

## 2022-06-11 NOTE — Telephone Encounter (Signed)
Returned her call. She is trying to see if referral has come from PCP. Reached out to new patient scheduler and she has not seen the referral. Larene Beach made aware and will reach out to PCP about referral.

## 2022-06-20 ENCOUNTER — Telehealth: Payer: Self-pay

## 2022-06-20 NOTE — Telephone Encounter (Signed)
Pt called and LVM stating she is in need of an appt and her PCP has sent [place] a referral multiple times. I spoke with our referral scheduler today who states she has not received a referral. Called and LVM for pt to call her PCP to provide our fax number.

## 2022-06-26 ENCOUNTER — Other Ambulatory Visit: Payer: Self-pay | Admitting: Hematology and Oncology

## 2022-06-26 ENCOUNTER — Telehealth: Payer: Self-pay

## 2022-06-26 DIAGNOSIS — D509 Iron deficiency anemia, unspecified: Secondary | ICD-10-CM

## 2022-06-26 NOTE — Telephone Encounter (Signed)
Received referral from PCP. Called and scheduled appt with Dr. Alvy Bimler with lab on 3/14. She is aware of appt.

## 2022-06-28 ENCOUNTER — Inpatient Hospital Stay: Payer: 59 | Attending: Hematology and Oncology | Admitting: Hematology and Oncology

## 2022-06-28 ENCOUNTER — Inpatient Hospital Stay: Payer: 59

## 2022-06-28 ENCOUNTER — Encounter: Payer: Self-pay | Admitting: Hematology and Oncology

## 2022-06-28 ENCOUNTER — Other Ambulatory Visit: Payer: Self-pay

## 2022-06-28 VITALS — BP 118/58 | HR 88 | Temp 97.9°F | Resp 19 | Wt 245.4 lb

## 2022-06-28 DIAGNOSIS — D509 Iron deficiency anemia, unspecified: Secondary | ICD-10-CM

## 2022-06-28 DIAGNOSIS — R928 Other abnormal and inconclusive findings on diagnostic imaging of breast: Secondary | ICD-10-CM | POA: Insufficient documentation

## 2022-06-28 DIAGNOSIS — M797 Fibromyalgia: Secondary | ICD-10-CM | POA: Insufficient documentation

## 2022-06-28 DIAGNOSIS — G4733 Obstructive sleep apnea (adult) (pediatric): Secondary | ICD-10-CM | POA: Insufficient documentation

## 2022-06-28 LAB — CBC WITH DIFFERENTIAL (CANCER CENTER ONLY)
Abs Immature Granulocytes: 0.03 10*3/uL (ref 0.00–0.07)
Basophils Absolute: 0.1 10*3/uL (ref 0.0–0.1)
Basophils Relative: 1 %
Eosinophils Absolute: 0.2 10*3/uL (ref 0.0–0.5)
Eosinophils Relative: 2 %
HCT: 41 % (ref 36.0–46.0)
Hemoglobin: 13.1 g/dL (ref 12.0–15.0)
Immature Granulocytes: 0 %
Lymphocytes Relative: 27 %
Lymphs Abs: 2.5 10*3/uL (ref 0.7–4.0)
MCH: 25.3 pg — ABNORMAL LOW (ref 26.0–34.0)
MCHC: 32 g/dL (ref 30.0–36.0)
MCV: 79.3 fL — ABNORMAL LOW (ref 80.0–100.0)
Monocytes Absolute: 0.6 10*3/uL (ref 0.1–1.0)
Monocytes Relative: 6 %
Neutro Abs: 6.2 10*3/uL (ref 1.7–7.7)
Neutrophils Relative %: 64 %
Platelet Count: 391 10*3/uL (ref 150–400)
RBC: 5.17 MIL/uL — ABNORMAL HIGH (ref 3.87–5.11)
RDW: 15.5 % (ref 11.5–15.5)
WBC Count: 9.6 10*3/uL (ref 4.0–10.5)
nRBC: 0 % (ref 0.0–0.2)

## 2022-06-28 LAB — IRON AND IRON BINDING CAPACITY (CC-WL,HP ONLY)
Iron: 28 ug/dL (ref 28–170)
Saturation Ratios: 7 % — ABNORMAL LOW (ref 10.4–31.8)
TIBC: 419 ug/dL (ref 250–450)
UIBC: 391 ug/dL (ref 148–442)

## 2022-06-28 LAB — FERRITIN: Ferritin: 68 ng/mL (ref 11–307)

## 2022-06-28 LAB — VITAMIN B12: Vitamin B-12: 1038 pg/mL — ABNORMAL HIGH (ref 180–914)

## 2022-06-28 NOTE — Assessment & Plan Note (Signed)
She is not anemia Iron studies are adequate The elevated RBC and microcytosis is most consistent with thalassemia

## 2022-06-28 NOTE — Progress Notes (Signed)
[place] FOLLOW-UP progress notes  Patient Care Team: [facility] as PCP - General (Family Medicine) Ena Dawley, MD as Consulting Physician (Obstetrics and Gynecology) Murriel Hopper, MD as Referring Physician (Student)  CHIEF COMPLAINTS/PURPOSE OF VISIT:  IDA  HISTORY OF PRESENTING ILLNESS:  Cheyenne Arias 47 y.o. female was last seen over 3 years ago. She is billed as a new patient today We discussed recent test results related to her blood work and abnormal mammogram  I reviewed the patient's records extensive and collaborated the history with the patient. Summary of her history is as follows: Summary of hematological history This patient have chronic fatigue, fibromyalgia, uterine fibroids with chronic menorrhagia, severe iron deficiency and reactive thrombocytosis. She had chronic microcytic anemia for many years. In fact, there were no changes with MCV and the highest hemoglobin was 10.7. She had received intermittent intravenous iron infusion in the past, her last infusion was in January 2014. The patient could not tolerate oral iron supplements. She have chronic fatigue with fibromyalgia, obstructive sleep apnea and have been oxygen therapy for 2 years. She takes regular ibuprofen for pelvic pain. She has irregular menstruation and abnormal Pap smear in the past The patient had history of intermittent epistaxis for many years, usually coming from the left nasal passage. The patient has severe microcytic anemia. She received multiple doses of intravenous iron, last given in Feb 2019  MEDICAL HISTORY:  Past Medical History:  Diagnosis Date   Allergy    seasonal   Anemia    goes to [facility], ferahem   Anxiety    Arthritis    spinal stenosis   Asthma    Bipolar disorder ([place])    Cervical cancer ([place])    Depression    Fatty liver disease, nonalcoholic    Fibromyalgia    Food poisoning    GERD (gastroesophageal reflux  disease)    Hepatitis    type A from food poisoning   HSV infection    Hypertension    Lumbar radiculopathy    Migraines    Neuropathy    Obesity    OSA (obstructive sleep apnea)    OSA on CPAP    uses it nightly   Peripheral neuropathy    LLE - pins and needles   Shortness of breath dyspnea    with exertion   Spinal stenosis     SURGICAL HISTORY: Past Surgical History:  Procedure Laterality Date   CARPAL TUNNEL RELEASE Right    [place], 2013   x 3   CESAREAN SECTION  11/28/2011   Procedure: CESAREAN SECTION;  Surgeon: Woodroe Mode, MD;  Location: [facility];  Service: Obstetrics;  Laterality: N/A;   COLONOSCOPY N/A 04/01/2017   Procedure: COLONOSCOPY;  Surgeon: Ladene Artist, MD;  Location: [facility];  Service: Endoscopy;  Laterality: N/A;   COLPOSCOPY     DILITATION & CURRETTAGE/HYSTROSCOPY WITH NOVASURE ABLATION N/A 02/13/2017   Procedure: DILATATION & CURETTAGE/HYSTEROSCOPY WITH NOVASURE ABLATION;  Surgeon: Ena Dawley, MD;  Location: [facility];  Service: Gynecology;  Laterality: N/A;   FACET JOINT INJECTION     multiple injections   HYSTEROSCOPY WITH D & C N/A 08/17/2015   Procedure: DILATATION AND CURETTAGE /HYSTEROSCOPY;  Surgeon: Ena Dawley, MD;  Location: [facility];  Service: Gynecology;  Laterality: N/A;   MANDIBLE SURGERY  2008   WISDOM TOOTH EXTRACTION      SOCIAL HISTORY: Social History   Socioeconomic History  Marital status: Single    Spouse name: Not on file   Number of children: 3   Years of education: Not on file   Highest education level: Not on file  Occupational History   Not on file  Tobacco Use   Smoking status: Never   Smokeless tobacco: Never   Tobacco comments:    never  Vaping Use   Vaping Use: Never used  Substance and Sexual Activity   Alcohol use: No   Drug use: No   Sexual activity: Not on file  Other Topics Concern   Not on file  Social History Narrative   Not on file   Social Determinants of  Health   Financial Resource Strain: Not on file  Food Insecurity: Not on file  Transportation Needs: Not on file  Physical Activity: Not on file  Stress: Not on file  Social Connections: Not on file  Intimate Partner Violence: Not on file    FAMILY HISTORY: Family History  Problem Relation Age of Onset   Hypertension Mother    Kidney disease Father        transplant required   Cancer Maternal Aunt 46       breast ca   Sickle cell trait Maternal Aunt    Depression Daughter    Schizophrenia Son    Sickle cell anemia Other    Stomach cancer Neg Hx    Colon cancer Neg Hx     ALLERGIES:  is allergic to duloxetine hcl, latex, shellfish allergy, tree extract, morphine and related, and tape.  MEDICATIONS:  Current Outpatient Medications  Medication Sig Dispense Refill   tirzepatide (MOUNJARO) 7.5 MG/0.5ML Pen Inject 7.5 mg into the skin once a week.     Acetaminophen 500 MG capsule Take 500-1,000 mg by mouth every 6 (six) hours as needed for pain.     albuterol (PROAIR HFA) 108 (90 Base) MCG/ACT inhaler Inhale 2 puffs into the lungs every 6 (six) hours as needed for wheezing or shortness of breath. 1 Inhaler 0   budesonide (PULMICORT) 0.5 MG/2ML nebulizer solution TAKE 2 MLS BY NEBULIZATION 2 TIMES DAILY. DX CODE: CHRONIC ASTHMA J45.909 (Patient taking differently: Take 0.5 mg by nebulization 2 (two) times daily. DX CODE: CHRONIC ASTHMA J45.909) 120 mL 5   fluticasone (FLONASE) 50 MCG/ACT nasal spray PLACE 2 SPRAYS INTO BOTH NOSTRILS 2 (TWO) TIMES DAILY. (Patient taking differently: Place 2 sprays into both nostrils 2 (two) times daily.) 16 g 5   ipratropium-albuterol (DUONEB) 0.5-2.5 (3) MG/3ML SOLN TAKE 3 MLS BY NEBULIZATION EVERY 4 HOURS AS NEEDED (FOR WHEEZING/SHORTNESS OF BREATH). (Patient taking differently: Take 3 mLs by nebulization every 4 (four) hours as needed (for wheezing or shortness of breath).) 360 mL 5   lisinopril (ZESTRIL) 40 MG tablet Take 40 mg by mouth daily.      loratadine (CLARITIN) 10 MG tablet TAKE 10 MG BY MOUTH at night (Patient taking differently: Take 10 mg by mouth daily.) 90 tablet 1   montelukast (SINGULAIR) 10 MG tablet TAKE 10 MG BY MOUTH AT BEDTIME (Patient taking differently: Take 10 mg by mouth at bedtime.) 30 tablet 5   Multiple Vitamins-Minerals (CENTRUM WOMEN) TABS Take 1 tablet by mouth daily with breakfast.     nystatin powder Apply 1 application. topically 3 (three) times daily as needed (to affected areas).     ondansetron (ZOFRAN-ODT) 8 MG disintegrating tablet Take 8 mg by mouth every 8 (eight) hours as needed for nausea or vomiting (dissolve orally).  oxyCODONE (ROXICODONE) 15 MG immediate release tablet Take 15 mg by mouth 4 (four) times daily.     pantoprazole (PROTONIX) 40 MG tablet Take 1 tablet (40 mg total) by mouth 2 (two) times daily. Take 40 mg by mouth at night 60 tablet 0   pregabalin (LYRICA) 300 MG capsule Take 300 mg by mouth in the morning and at bedtime.     PRESCRIPTION MEDICATION CPAP- Use during any time of rest     rizatriptan (MAXALT) 10 MG tablet Take 10 mg by mouth See admin instructions. Take 10 mg by mouth at onset of migraine and may repeat once in 2 hours, if no relief     tiZANidine (ZANAFLEX) 4 MG tablet Take 4 mg by mouth 3 (three) times daily.     triamcinolone cream (KENALOG) 0.1 % Apply 1 application. topically daily as needed (to affected sites- for itching or rashes).     valACYclovir (VALTREX) 500 MG tablet Take 500 mg by mouth daily.     No current facility-administered medications for this visit.    REVIEW OF SYSTEMS:   Constitutional: Denies fevers, chills or abnormal night sweats Eyes: Denies blurriness of vision, double vision or watery eyes Ears, nose, mouth, throat, and face: Denies mucositis or sore throat Respiratory: Denies cough, dyspnea or wheezes Cardiovascular: Denies palpitation, chest discomfort or lower extremity swelling Gastrointestinal:  Denies nausea, heartburn or  change in bowel habits Skin: Denies abnormal skin rashes Lymphatics: Denies new lymphadenopathy or easy bruising Neurological:Denies numbness, tingling or new weaknesses Behavioral/Psych: Mood is stable, no new changes  All other systems were reviewed with the patient and are negative.  PHYSICAL EXAMINATION: ECOG PERFORMANCE STATUS: 0 - Asymptomatic  Vitals:   06/28/22 1054  BP: (!) 118/58  Pulse: 88  Resp: 19  Temp: 97.9 F (36.6 C)  SpO2: 100%   Filed Weights   06/28/22 1054  Weight: 245 lb 6.4 oz (111.3 kg)    GENERAL:alert, no distress and comfortable  NEURO: no focal motor/sensory deficits  LABORATORY DATA:  I have reviewed the data as listed Lab Results  Component Value Date   WBC 9.6 06/28/2022   HGB 13.1 06/28/2022   HCT 41.0 06/28/2022   MCV 79.3 (L) 06/28/2022   PLT 391 06/28/2022   Recent Labs    08/25/21 0750 08/25/21 1801 08/26/21 0339  NA 138  --  136  K 4.0  --  3.5  CL 105  --  103  CO2 26  --  26  GLUCOSE 109*  --  100*  BUN 11  --  7  CREATININE 0.67 0.65 0.69  CALCIUM 9.2  --  9.3  GFRNONAA >60 >60 >60  PROT 8.1  --   --   ALBUMIN 4.2  --   --   AST 18  --   --   ALT 14  --   --   ALKPHOS 67  --   --   BILITOT 0.6  --   --     ASSESSMENT & PLAN:  IDA (iron deficiency anemia) She is not anemia Iron studies are adequate The elevated RBC and microcytosis is most consistent with thalassemia  Abnormal mammogram She is undergoing evaluation We discussed what to expect if she is diagnosed with breast ca  No orders of the defined types were placed in this encounter.   All questions were answered. The patient knows to call the clinic with any problems, questions or concerns. The total time spent in the appointment  was 40 minutes encounter with patients including review of chart and various tests results, discussions about plan of care and coordination of care plan   Heath Lark, MD 06/28/2022 4:53 PM

## 2022-06-28 NOTE — Assessment & Plan Note (Signed)
She is undergoing evaluation We discussed what to expect if she is diagnosed with breast ca

## 2022-06-29 ENCOUNTER — Telehealth: Payer: Self-pay

## 2022-06-29 NOTE — Telephone Encounter (Signed)
Called and given below message. She verbalized understanding. 

## 2022-06-29 NOTE — Telephone Encounter (Signed)
-----   Message from Heath Lark, MD sent at 06/28/2022  4:44 PM EDT ----- Pls let her know ferritin is good

## 2022-07-09 ENCOUNTER — Telehealth: Payer: Self-pay | Admitting: *Deleted

## 2022-07-09 NOTE — Telephone Encounter (Signed)
Patient called and spoke at length regarding multiple concerns. She had recent biopsy completed at [facility] on 07/06/22.  Per patient, she has spoken with Breast Navigator at [facility] and was informed that biopsy results did not indicate malignancy, but that "they don't know what it is". Patient is concerned that it could be something other than breast cancer and that more focused follow up is needed.   Patient states she told Breast Navigator she wanted to have any care needed with Dr. Alvy Bimler as she has seen her in the past. Patient stated [facility] made her an appt with Dr. Alvy Bimler on 07/26/22, but that she wanted to be on the cancellation list and come earlier.  She asks if Dr. Alvy Bimler will have access to her records from the biopsy - she said she asked [facility] to send whatever is needed. Informed her that some information is likely in the chart via [place] and if ppwk has been faxed from [facility], it may not have arrived yet as biopsy was last week and results discussed with her on Friday.   Ms. Mckern described an area under arm/on breast near biopsy site that was red when she woke up this morning. Advised her to contact [facility] asap to follow up as procedure was done there and Navigator will be able to assist her with contacting correct staff. Ms. Livelsberger verbalized understanding.   She asked that Dr. Alvy Bimler receive all information she has shared and that she be placed on cancellation list. This message routed to Dr. Alvy Bimler and support staff. Message sent to Scheduling r/t earlier appt.

## 2022-07-10 NOTE — Progress Notes (Signed)
Received fax from [facility] regarding 07/04/22 breast biopsy results. Fax placed in bin to be scanned.

## 2022-07-10 NOTE — Progress Notes (Signed)
Fax request for 07/04/22 breast biopsy results sent to [facility] medical records at [phone] with receipt confirmation.

## 2022-07-17 NOTE — Telephone Encounter (Signed)
Hi Cheyenne Arias,  Can you call and ask how she is doing? Unfortunately I do not have any earlier appt

## 2022-07-17 NOTE — Telephone Encounter (Signed)
Called to see how she is doing. She is doing okay and just a little bruised from the biopsy. She appreciated the call and will call the office back if something changes. She is aware of scheduled appt on 4/11 with Dr. Alvy Bimler. If the office has a cancellation she will take the appt.

## 2022-07-26 ENCOUNTER — Inpatient Hospital Stay: Payer: 59 | Attending: Hematology and Oncology | Admitting: Hematology and Oncology

## 2022-07-26 ENCOUNTER — Encounter: Payer: Self-pay | Admitting: Hematology and Oncology

## 2022-07-26 VITALS — BP 145/69 | HR 90 | Temp 99.4°F | Resp 18 | Ht 62.0 in | Wt 255.2 lb

## 2022-07-26 DIAGNOSIS — D509 Iron deficiency anemia, unspecified: Secondary | ICD-10-CM | POA: Insufficient documentation

## 2022-07-26 DIAGNOSIS — R928 Other abnormal and inconclusive findings on diagnostic imaging of breast: Secondary | ICD-10-CM | POA: Diagnosis not present

## 2022-07-26 DIAGNOSIS — R897 Abnormal histological findings in specimens from other organs, systems and tissues: Secondary | ICD-10-CM

## 2022-07-26 DIAGNOSIS — R590 Localized enlarged lymph nodes: Secondary | ICD-10-CM | POA: Diagnosis not present

## 2022-07-26 DIAGNOSIS — R768 Other specified abnormal immunological findings in serum: Secondary | ICD-10-CM

## 2022-07-26 NOTE — Assessment & Plan Note (Signed)
Biopsy from the right axilla was benign The size was large based on outside report She had lymph node biopsy done before in the head and neck region in 2014 that came back reactive The patient still have concern that cancer diagnosis could be missed I will order MRI as above for further evaluation for now

## 2022-07-26 NOTE — Progress Notes (Signed)
[facility] OFFICE PROGRESS NOTE  Patient Care Team: [facility] - General (Family Medicine) Kirkland HunStringer, Arthur, MD as Consulting Physician (Obstetrics and Gynecology) Lorelee CoverMorris, John C, MD as Referring Physician (Student)  ASSESSMENT & PLAN:  Abnormal mammogram I spent a lot of time reviewing test results with the patient and her mother All the biopsies came back negative malignancy However, she still have concern she might have undiagnosed malignancy We discussed risk and benefits of MRI breast and she is in agreement to proceed I will see her back in 3 weeks for further follow-up  Axillary lymphadenopathy Biopsy from the right axilla was benign The size was large based on outside report She had lymph node biopsy done before in the head and neck region in 2014 that came back reactive The patient still have concern that cancer diagnosis could be missed I will order MRI as above for further evaluation for now  Positive ANA (antinuclear antibody) Review her recent test result The patient had ANA testing done in the past that was negative but another set was drawn last year that came back low positive with a titer less than 1 in 40 She has concerned that she might have undiagnosed lupus I recommend the patient to reach out to her primary care doctor for referral to see a rheumatologist for further discussion and evaluation  Orders Placed This Encounter  Procedures   MR BREAST BILATERAL W WO CONTRAST INC CAD    Standing Status:   Future    Standing Expiration Date:   07/26/2023    Order Specific Question:   If indicated for the ordered procedure, I authorize the administration of contrast media per Radiology protocol    Answer:   Yes    Order Specific Question:   What is the patient's sedation requirement?    Answer:   No Sedation    Order Specific Question:   Does the patient have a pacemaker or implanted devices?    Answer:   No     Order Specific Question:   Preferred imaging [facility]?    [facility] (table limit - [facility] lbs)    All questions were answered. The patient knows to call the clinic with any problems, questions or concerns. The total time spent in the appointment was 40 minutes encounter with patients including review of chart and various tests results, discussions about plan of care and coordination of care plan   Artis DelayNi Shaylene Paganelli, MD 07/26/2022 3:56 PM  INTERVAL HISTORY: Please see below for problem oriented charting. she returns for further evaluation She was previously treated by myself for iron deficiency anemia Last month, she told me she had abnormal mammogram leading to ultrasound guided biopsy I have reviewed outside records extensively with the patient and her mother We discussed mammogram and ultrasound finding as well as pathology report I also reviewed her internal records In 2014, she had palpable lymphadenopathy in the head and neck region Ultrasound only shows small lymphadenopathy.  Biopsy came back negative for malignancy The patient is concerned she might have undiagnosed lupus She is also concerned about possible missed diagnosis of cancer  REVIEW OF SYSTEMS:   Constitutional: Denies fevers, chills or abnormal weight loss Eyes: Denies blurriness of vision Ears, nose, mouth, throat, and face: Denies mucositis or sore throat Respiratory: Denies cough, dyspnea or wheezes Cardiovascular: Denies palpitation, chest discomfort or lower extremity swelling Gastrointestinal:  Denies nausea, heartburn or change in bowel habits Skin:  Denies abnormal skin rashes Neurological:Denies numbness, tingling or new weaknesses Behavioral/Psych: Mood is stable, no new changes  All other systems were reviewed with the patient and are negative.  I have reviewed the past medical history, past surgical history, social history and family history with the patient and they are unchanged from  previous note.  ALLERGIES:  is allergic to duloxetine hcl, latex, shellfish allergy, tree extract, morphine and related, and tape.  MEDICATIONS:  Current Outpatient Medications  Medication Sig Dispense Refill   Acetaminophen 500 MG capsule Take 500-1,000 mg by mouth every 6 (six) hours as needed for pain.     albuterol (PROAIR HFA) 108 (90 Base) MCG/ACT inhaler Inhale 2 puffs into the lungs every 6 (six) hours as needed for wheezing or shortness of breath. 1 Inhaler 0   budesonide (PULMICORT) 0.5 MG/2ML nebulizer solution TAKE 2 MLS BY NEBULIZATION 2 TIMES DAILY. DX CODE: CHRONIC ASTHMA J45.909 (Patient taking differently: Take 0.5 mg by nebulization 2 (two) times daily. DX CODE: CHRONIC ASTHMA J45.909) 120 mL 5   fluticasone (FLONASE) 50 MCG/ACT nasal spray PLACE 2 SPRAYS INTO BOTH NOSTRILS 2 (TWO) TIMES DAILY. (Patient taking differently: Place 2 sprays into both nostrils 2 (two) times daily.) 16 g 5   ipratropium-albuterol (DUONEB) 0.5-2.5 (3) MG/3ML SOLN TAKE 3 MLS BY NEBULIZATION EVERY 4 HOURS AS NEEDED (FOR WHEEZING/SHORTNESS OF BREATH). (Patient taking differently: Take 3 mLs by nebulization every 4 (four) hours as needed (for wheezing or shortness of breath).) 360 mL 5   lisinopril (ZESTRIL) 40 MG tablet Take 40 mg by mouth daily.     loratadine (CLARITIN) 10 MG tablet TAKE 10 MG BY MOUTH at night (Patient taking differently: Take 10 mg by mouth daily.) 90 tablet 1   montelukast (SINGULAIR) 10 MG tablet TAKE 10 MG BY MOUTH AT BEDTIME (Patient taking differently: Take 10 mg by mouth at bedtime.) 30 tablet 5   Multiple Vitamins-Minerals (CENTRUM WOMEN) TABS Take 1 tablet by mouth daily with breakfast.     nystatin powder Apply 1 application. topically 3 (three) times daily as needed (to affected areas).     ondansetron (ZOFRAN-ODT) 8 MG disintegrating tablet Take 8 mg by mouth every 8 (eight) hours as needed for nausea or vomiting (dissolve orally).     oxyCODONE (ROXICODONE) 15 MG immediate  release tablet Take 15 mg by mouth 4 (four) times daily.     pantoprazole (PROTONIX) 40 MG tablet Take 1 tablet (40 mg total) by mouth 2 (two) times daily. Take 40 mg by mouth at night 60 tablet 0   pregabalin (LYRICA) 300 MG capsule Take 300 mg by mouth in the morning and at bedtime.     PRESCRIPTION MEDICATION CPAP- Use during any time of rest     rizatriptan (MAXALT) 10 MG tablet Take 10 mg by mouth See admin instructions. Take 10 mg by mouth at onset of migraine and may repeat once in 2 hours, if no relief     tirzepatide (MOUNJARO) 7.5 MG/0.5ML Pen Inject 7.5 mg into the skin once a week.     tiZANidine (ZANAFLEX) 4 MG tablet Take 4 mg by mouth 3 (three) times daily.     triamcinolone cream (KENALOG) 0.1 % Apply 1 application. topically daily as needed (to affected sites- for itching or rashes).     valACYclovir (VALTREX) 500 MG tablet Take 500 mg by mouth daily.     No current facility-administered medications for this visit.    PHYSICAL EXAMINATION: ECOG PERFORMANCE STATUS: 0 -  Asymptomatic  Vitals:   07/26/22 1203  BP: (!) 145/69  Pulse: 90  Resp: 18  Temp: 99.4 F (37.4 C)  SpO2: 100%   Filed Weights   07/26/22 1203  Weight: 255 lb 3.2 oz (115.8 kg)    GENERAL:alert, no distress and comfortable  NEURO: alert & oriented x 3 with fluent speech, no focal motor/sensory deficits  LABORATORY DATA:  I have reviewed the data as listed    Component Value Date/Time   NA 136 08/26/2021 0339   NA 139 08/29/2012 1102   K 3.5 08/26/2021 0339   K 3.2 (L) 08/29/2012 1102   CL 103 08/26/2021 0339   CL 105 08/29/2012 1102   CO2 26 08/26/2021 0339   CO2 23 08/29/2012 1102   GLUCOSE 100 (H) 08/26/2021 0339   GLUCOSE 130 (H) 08/29/2012 1102   BUN 7 08/26/2021 0339   BUN 5.8 (L) 08/29/2012 1102   CREATININE 0.69 08/26/2021 0339   CREATININE 0.8 08/29/2012 1102   CALCIUM 9.3 08/26/2021 0339   CALCIUM 8.6 08/29/2012 1102   PROT 8.1 08/25/2021 0750   PROT 7.3 08/29/2012 1102    ALBUMIN 4.2 08/25/2021 0750   ALBUMIN 3.6 08/29/2012 1102   AST 18 08/25/2021 0750   AST 22 08/29/2012 1102   ALT 14 08/25/2021 0750   ALT 23 08/29/2012 1102   ALKPHOS 67 08/25/2021 0750   ALKPHOS 86 08/29/2012 1102   BILITOT 0.6 08/25/2021 0750   BILITOT 0.22 08/29/2012 1102   GFRNONAA >60 08/26/2021 0339   GFRAA >60 03/10/2017 1735    No results found for: "SPEP", "UPEP"  Lab Results  Component Value Date   WBC 9.6 06/28/2022   NEUTROABS 6.2 06/28/2022   HGB 13.1 06/28/2022   HCT 41.0 06/28/2022   MCV 79.3 (L) 06/28/2022   PLT 391 06/28/2022      Chemistry      Component Value Date/Time   NA 136 08/26/2021 0339   NA 139 08/29/2012 1102   K 3.5 08/26/2021 0339   K 3.2 (L) 08/29/2012 1102   CL 103 08/26/2021 0339   CL 105 08/29/2012 1102   CO2 26 08/26/2021 0339   CO2 23 08/29/2012 1102   BUN 7 08/26/2021 0339   BUN 5.8 (L) 08/29/2012 1102   CREATININE 0.69 08/26/2021 0339   CREATININE 0.8 08/29/2012 1102      Component Value Date/Time   CALCIUM 9.3 08/26/2021 0339   CALCIUM 8.6 08/29/2012 1102   ALKPHOS 67 08/25/2021 0750   ALKPHOS 86 08/29/2012 1102   AST 18 08/25/2021 0750   AST 22 08/29/2012 1102   ALT 14 08/25/2021 0750   ALT 23 08/29/2012 1102   BILITOT 0.6 08/25/2021 0750   BILITOT 0.22 08/29/2012 1102

## 2022-07-26 NOTE — Assessment & Plan Note (Signed)
I spent a lot of time reviewing test results with the patient and her mother All the biopsies came back negative malignancy However, she still have concern she might have undiagnosed malignancy We discussed risk and benefits of MRI breast and she is in agreement to proceed I will see her back in 3 weeks for further follow-up

## 2022-07-26 NOTE — Assessment & Plan Note (Signed)
Review her recent test result The patient had ANA testing done in the past that was negative but another set was drawn last year that came back low positive with a titer less than 1 in 40 She has concerned that she might have undiagnosed lupus I recommend the patient to reach out to her primary care doctor for referral to see a rheumatologist for further discussion and evaluation

## 2022-07-27 ENCOUNTER — Telehealth: Payer: Self-pay | Admitting: Hematology and Oncology

## 2022-07-27 ENCOUNTER — Inpatient Hospital Stay
Admission: RE | Admit: 2022-07-27 | Discharge: 2022-07-27 | Disposition: A | Discharge status: Home / Self Care | Payer: Self-pay | Source: Ambulatory Visit | Attending: Hematology and Oncology | Admitting: Hematology and Oncology

## 2022-07-27 ENCOUNTER — Other Ambulatory Visit: Payer: Self-pay

## 2022-07-27 DIAGNOSIS — R928 Other abnormal and inconclusive findings on diagnostic imaging of breast: Secondary | ICD-10-CM

## 2022-07-27 DIAGNOSIS — R897 Abnormal histological findings in specimens from other organs, systems and tissues: Secondary | ICD-10-CM

## 2022-07-27 NOTE — Telephone Encounter (Signed)
Left patient a vm regarding upcoming appointment  

## 2022-08-06 ENCOUNTER — Telehealth: Payer: Self-pay

## 2022-08-06 ENCOUNTER — Telehealth: Payer: Self-pay | Admitting: Hematology and Oncology

## 2022-08-06 NOTE — Telephone Encounter (Signed)
-----   Message from Artis Delay, MD sent at 08/06/2022  7:41 AM EDT ----- Her MRI is scheduled for May 1st Can you cancel her appt on 4/30 and move it to 5/3 at 1220?

## 2022-08-06 NOTE — Telephone Encounter (Signed)
Called and moved appt to 5/3. She is aware of appt.

## 2022-08-06 NOTE — Telephone Encounter (Signed)
Patient left voicemail to schedule, left voicemail for patient to call back.

## 2022-08-09 ENCOUNTER — Ambulatory Visit (HOSPITAL_COMMUNITY): Payer: 59

## 2022-08-14 ENCOUNTER — Inpatient Hospital Stay: Payer: 59 | Admitting: Hematology and Oncology

## 2022-08-15 ENCOUNTER — Ambulatory Visit (HOSPITAL_COMMUNITY)
Admission: RE | Admit: 2022-08-15 | Discharge: 2022-08-15 | Disposition: A | Discharge status: Home / Self Care | Payer: 59 | Source: Ambulatory Visit | Attending: Hematology and Oncology | Admitting: Hematology and Oncology

## 2022-08-15 ENCOUNTER — Encounter (HOSPITAL_COMMUNITY): Payer: Self-pay

## 2022-08-15 ENCOUNTER — Ambulatory Visit
Admission: RE | Admit: 2022-08-15 | Discharge: 2022-08-15 | Disposition: A | Discharge status: Home / Self Care | Payer: 59 | Source: Ambulatory Visit | Attending: Hematology and Oncology | Admitting: Hematology and Oncology

## 2022-08-15 DIAGNOSIS — R897 Abnormal histological findings in specimens from other organs, systems and tissues: Secondary | ICD-10-CM

## 2022-08-15 MED ORDER — GADOPICLENOL 0.5 MMOL/ML IV SOLN
10.0000 mL | Freq: Once | INTRAVENOUS | Status: AC | PRN
  Administered 2022-08-15: 10 mL via INTRAVENOUS

## 2022-08-17 ENCOUNTER — Other Ambulatory Visit: Payer: Self-pay

## 2022-08-17 ENCOUNTER — Encounter: Payer: Self-pay | Admitting: Hematology and Oncology

## 2022-08-17 ENCOUNTER — Inpatient Hospital Stay: Payer: 59 | Attending: Hematology and Oncology | Admitting: Hematology and Oncology

## 2022-08-17 ENCOUNTER — Inpatient Hospital Stay: Payer: 59

## 2022-08-17 ENCOUNTER — Telehealth: Payer: Self-pay

## 2022-08-17 VITALS — BP 132/59 | HR 91 | Resp 18 | Ht 62.0 in | Wt 255.0 lb

## 2022-08-17 DIAGNOSIS — Z7951 Long term (current) use of inhaled steroids: Secondary | ICD-10-CM | POA: Insufficient documentation

## 2022-08-17 DIAGNOSIS — D509 Iron deficiency anemia, unspecified: Secondary | ICD-10-CM | POA: Diagnosis present

## 2022-08-17 DIAGNOSIS — Z79624 Long term (current) use of inhibitors of nucleotide synthesis: Secondary | ICD-10-CM | POA: Diagnosis not present

## 2022-08-17 DIAGNOSIS — D479 Neoplasm of uncertain behavior of lymphoid, hematopoietic and related tissue, unspecified: Secondary | ICD-10-CM

## 2022-08-17 DIAGNOSIS — Z79899 Other long term (current) drug therapy: Secondary | ICD-10-CM | POA: Diagnosis not present

## 2022-08-17 DIAGNOSIS — R928 Other abnormal and inconclusive findings on diagnostic imaging of breast: Secondary | ICD-10-CM | POA: Diagnosis present

## 2022-08-17 DIAGNOSIS — R590 Localized enlarged lymph nodes: Secondary | ICD-10-CM | POA: Diagnosis present

## 2022-08-17 LAB — CBC WITH DIFFERENTIAL (CANCER CENTER ONLY)
Abs Immature Granulocytes: 0.06 10*3/uL (ref 0.00–0.07)
Basophils Absolute: 0.1 10*3/uL (ref 0.0–0.1)
Basophils Relative: 1 %
Eosinophils Absolute: 0.2 10*3/uL (ref 0.0–0.5)
Eosinophils Relative: 2 %
HCT: 36.2 % (ref 36.0–46.0)
Hemoglobin: 11.7 g/dL — ABNORMAL LOW (ref 12.0–15.0)
Immature Granulocytes: 1 %
Lymphocytes Relative: 22 %
Lymphs Abs: 2.3 10*3/uL (ref 0.7–4.0)
MCH: 25.4 pg — ABNORMAL LOW (ref 26.0–34.0)
MCHC: 32.3 g/dL (ref 30.0–36.0)
MCV: 78.5 fL — ABNORMAL LOW (ref 80.0–100.0)
Monocytes Absolute: 0.7 10*3/uL (ref 0.1–1.0)
Monocytes Relative: 7 %
Neutro Abs: 6.9 10*3/uL (ref 1.7–7.7)
Neutrophils Relative %: 67 %
Platelet Count: 350 10*3/uL (ref 150–400)
RBC: 4.61 MIL/uL (ref 3.87–5.11)
RDW: 15.1 % (ref 11.5–15.5)
WBC Count: 10.1 10*3/uL (ref 4.0–10.5)
nRBC: 0 % (ref 0.0–0.2)

## 2022-08-17 LAB — CMP (CANCER CENTER ONLY)
ALT: 11 U/L (ref 0–44)
AST: 15 U/L (ref 15–41)
Albumin: 4.4 g/dL (ref 3.5–5.0)
Alkaline Phosphatase: 60 U/L (ref 38–126)
Anion gap: 6 (ref 5–15)
BUN: 9 mg/dL (ref 6–20)
CO2: 26 mmol/L (ref 22–32)
Calcium: 9.1 mg/dL (ref 8.9–10.3)
Chloride: 105 mmol/L (ref 98–111)
Creatinine: 0.76 mg/dL (ref 0.44–1.00)
GFR, Estimated: >60 mL/min (ref >60)
Glucose, Bld: 75 mg/dL (ref 70–99)
Potassium: 3.7 mmol/L (ref 3.5–5.1)
Sodium: 137 mmol/L (ref 135–145)
Total Bilirubin: 0.5 mg/dL (ref 0.3–1.2)
Total Protein: 7.6 g/dL (ref 6.5–8.1)

## 2022-08-17 LAB — LACTATE DEHYDROGENASE: LDH: 156 U/L (ref 98–192)

## 2022-08-17 LAB — SEDIMENTATION RATE: Sed Rate: 27 mm/hr — ABNORMAL HIGH (ref 0–22)

## 2022-08-17 NOTE — Progress Notes (Signed)
[facility] OFFICE PROGRESS NOTE  Patient Care Team: [facility], [facility] as PCP - General (Family Medicine) Kirkland Hun, MD as Consulting Physician (Obstetrics and Gynecology) Lorelee Cover, MD as Referring Physician (Student)  ASSESSMENT & PLAN:  Abnormal mammogram I have reviewed imaging studies with the patient There is nothing abnormal noted on recent MRI of the breast but significant diffuse lymphadenopathy is noted in the right axilla We discussed referral to general surgery and further investigation to rule out lymphoproliferative disorder I recommend CT imaging of the chest, abdomen and pelvis for evaluation along with some blood work  Orders Placed This Encounter  Procedures   CT CHEST ABDOMEN PELVIS W CONTRAST    Standing Status:   Future    Standing Expiration Date:   08/17/2023    Order Specific Question:   If indicated for the ordered procedure, I authorize the administration of contrast media per Radiology protocol    Answer:   Yes    Order Specific Question:   Does the patient have a contrast media/X-ray dye allergy?    Answer:   No    Order Specific Question:   Is patient pregnant?    Answer:   No    Order Specific Question:   Preferred imaging location?    Answer:   [facility]    Order Specific Question:   If indicated for the ordered procedure, I authorize the administration of oral contrast media per Radiology protocol    Answer:   Yes   CMP (Cancer Center only)    Standing Status:   Future    Number of Occurrences:   1    Standing Expiration Date:   08/17/2023   CBC with Differential (Cancer Center Only)    Standing Status:   Future    Number of Occurrences:   1    Standing Expiration Date:   08/17/2023   Lactate dehydrogenase    Standing Status:   Future    Number of Occurrences:   1    Standing Expiration Date:   08/17/2023   Sedimentation rate    Standing Status:   Future    Number of Occurrences:   1     Standing Expiration Date:   08/17/2023   Ambulatory referral to General Surgery    Referral Priority:   Routine    Referral Type:   Surgical    Referral Reason:   Specialty Services Required    Requested Specialty:   General Surgery    Number of Visits Requested:   1    All questions were answered. The patient knows to call the clinic with any problems, questions or concerns. The total time spent in the appointment was 30 minutes encounter with patients including review of chart and various tests results, discussions about plan of care and coordination of care plan   Artis Delay, MD 08/17/2022 1:45 PM  INTERVAL HISTORY: Please see below for problem oriented charting. she returns for review of test results She returns today to discuss recent MRI findings She she thought she felt discomfort in the right axilla recently  REVIEW OF SYSTEMS:   Constitutional: Denies fevers, chills or abnormal weight loss Eyes: Denies blurriness of vision Ears, nose, mouth, throat, and face: Denies mucositis or sore throat Respiratory: Denies cough, dyspnea or wheezes Cardiovascular: Denies palpitation, chest discomfort or lower extremity swelling Gastrointestinal:  Denies nausea, heartburn or change in bowel habits Skin: Denies abnormal skin rashes Lymphatics: Denies new lymphadenopathy  or easy bruising Neurological:Denies numbness, tingling or new weaknesses Behavioral/Psych: Mood is stable, no new changes  All other systems were reviewed with the patient and are negative.  I have reviewed the past medical history, past surgical history, social history and family history with the patient and they are unchanged from previous note.  ALLERGIES:  is allergic to duloxetine hcl, latex, shellfish allergy, tree extract, morphine and related, and tape.  MEDICATIONS:  Current Outpatient Medications  Medication Sig Dispense Refill   Acetaminophen 500 MG capsule Take 500-1,000 mg by mouth every 6 (six) hours as  needed for pain.     albuterol (PROAIR HFA) 108 (90 Base) MCG/ACT inhaler Inhale 2 puffs into the lungs every 6 (six) hours as needed for wheezing or shortness of breath. 1 Inhaler 0   budesonide (PULMICORT) 0.5 MG/2ML nebulizer solution TAKE 2 MLS BY NEBULIZATION 2 TIMES DAILY. DX CODE: CHRONIC ASTHMA J45.909 (Patient taking differently: Take 0.5 mg by nebulization 2 (two) times daily. DX CODE: CHRONIC ASTHMA J45.909) 120 mL 5   fluticasone (FLONASE) 50 MCG/ACT nasal spray PLACE 2 SPRAYS INTO BOTH NOSTRILS 2 (TWO) TIMES DAILY. (Patient taking differently: Place 2 sprays into both nostrils 2 (two) times daily.) 16 g 5   ipratropium-albuterol (DUONEB) 0.5-2.5 (3) MG/3ML SOLN TAKE 3 MLS BY NEBULIZATION EVERY 4 HOURS AS NEEDED (FOR WHEEZING/SHORTNESS OF BREATH). (Patient taking differently: Take 3 mLs by nebulization every 4 (four) hours as needed (for wheezing or shortness of breath).) 360 mL 5   lisinopril (ZESTRIL) 40 MG tablet Take 40 mg by mouth daily.     loratadine (CLARITIN) 10 MG tablet TAKE 10 MG BY MOUTH at night (Patient taking differently: Take 10 mg by mouth daily.) 90 tablet 1   montelukast (SINGULAIR) 10 MG tablet TAKE 10 MG BY MOUTH AT BEDTIME (Patient taking differently: Take 10 mg by mouth at bedtime.) 30 tablet 5   Multiple Vitamins-Minerals (CENTRUM WOMEN) TABS Take 1 tablet by mouth daily with breakfast.     nystatin powder Apply 1 application. topically 3 (three) times daily as needed (to affected areas).     ondansetron (ZOFRAN-ODT) 8 MG disintegrating tablet Take 8 mg by mouth every 8 (eight) hours as needed for nausea or vomiting (dissolve orally).     oxyCODONE (ROXICODONE) 15 MG immediate release tablet Take 15 mg by mouth 4 (four) times daily.     pantoprazole (PROTONIX) 40 MG tablet Take 1 tablet (40 mg total) by mouth 2 (two) times daily. Take 40 mg by mouth at night 60 tablet 0   pregabalin (LYRICA) 300 MG capsule Take 300 mg by mouth in the morning and at bedtime.      PRESCRIPTION MEDICATION CPAP- Use during any time of rest     rizatriptan (MAXALT) 10 MG tablet Take 10 mg by mouth See admin instructions. Take 10 mg by mouth at onset of migraine and may repeat once in 2 hours, if no relief     tirzepatide (MOUNJARO) 7.5 MG/0.5ML Pen Inject 7.5 mg into the skin once a week.     tiZANidine (ZANAFLEX) 4 MG tablet Take 4 mg by mouth 3 (three) times daily.     triamcinolone cream (KENALOG) 0.1 % Apply 1 application. topically daily as needed (to affected sites- for itching or rashes).     valACYclovir (VALTREX) 500 MG tablet Take 500 mg by mouth daily.     No current facility-administered medications for this visit.    SUMMARY OF ONCOLOGIC HISTORY: Oncology History  No history exists.    PHYSICAL EXAMINATION: ECOG PERFORMANCE STATUS: 1 - Symptomatic but completely ambulatory  Vitals:   08/17/22 1240  BP: (!) 132/59  Pulse: 91  Resp: 18  SpO2: 100%   Filed Weights   08/17/22 1240  Weight: 255 lb (115.7 kg)    GENERAL:alert, no distress and comfortable SKIN: skin color, texture, turgor are normal, no rashes or significant lesions EYES: normal, Conjunctiva are pink and non-injected, sclera clear OROPHARYNX:no exudate, no erythema and lips, buccal mucosa, and tongue normal  NECK: supple, thyroid normal size, non-tender, without nodularity LYMPH: She has palpable lymphadenopathy in the right axilla LUNGS: clear to auscultation and percussion with normal breathing effort HEART: regular rate & rhythm and no murmurs and no lower extremity edema ABDOMEN:abdomen soft, non-tender and normal bowel sounds Musculoskeletal:no cyanosis of digits and no clubbing  NEURO: alert & oriented x 3 with fluent speech, no focal motor/sensory deficits  LABORATORY DATA:  I have reviewed the data as listed    Component Value Date/Time   NA 136 08/26/2021 0339   NA 139 08/29/2012 1102   K 3.5 08/26/2021 0339   K 3.2 (L) 08/29/2012 1102   CL 103 08/26/2021 0339    CL 105 08/29/2012 1102   CO2 26 08/26/2021 0339   CO2 23 08/29/2012 1102   GLUCOSE 100 (H) 08/26/2021 0339   GLUCOSE 130 (H) 08/29/2012 1102   BUN 7 08/26/2021 0339   BUN 5.8 (L) 08/29/2012 1102   CREATININE 0.69 08/26/2021 0339   CREATININE 0.8 08/29/2012 1102   CALCIUM 9.3 08/26/2021 0339   CALCIUM 8.6 08/29/2012 1102   PROT 8.1 08/25/2021 0750   PROT 7.3 08/29/2012 1102   ALBUMIN 4.2 08/25/2021 0750   ALBUMIN 3.6 08/29/2012 1102   AST 18 08/25/2021 0750   AST 22 08/29/2012 1102   ALT 14 08/25/2021 0750   ALT 23 08/29/2012 1102   ALKPHOS 67 08/25/2021 0750   ALKPHOS 86 08/29/2012 1102   BILITOT 0.6 08/25/2021 0750   BILITOT 0.22 08/29/2012 1102   GFRNONAA >60 08/26/2021 0339   GFRAA >60 03/10/2017 1735    No results found for: "SPEP", "UPEP"  Lab Results  Component Value Date   WBC 10.1 08/17/2022   NEUTROABS 6.9 08/17/2022   HGB 11.7 (L) 08/17/2022   HCT 36.2 08/17/2022   MCV 78.5 (L) 08/17/2022   PLT 350 08/17/2022      Chemistry      Component Value Date/Time   NA 136 08/26/2021 0339   NA 139 08/29/2012 1102   K 3.5 08/26/2021 0339   K 3.2 (L) 08/29/2012 1102   CL 103 08/26/2021 0339   CL 105 08/29/2012 1102   CO2 26 08/26/2021 0339   CO2 23 08/29/2012 1102   BUN 7 08/26/2021 0339   BUN 5.8 (L) 08/29/2012 1102   CREATININE 0.69 08/26/2021 0339   CREATININE 0.8 08/29/2012 1102      Component Value Date/Time   CALCIUM 9.3 08/26/2021 0339   CALCIUM 8.6 08/29/2012 1102   ALKPHOS 67 08/25/2021 0750   ALKPHOS 86 08/29/2012 1102   AST 18 08/25/2021 0750   AST 22 08/29/2012 1102   ALT 14 08/25/2021 0750   ALT 23 08/29/2012 1102   BILITOT 0.6 08/25/2021 0750   BILITOT 0.22 08/29/2012 1102       RADIOGRAPHIC STUDIES: I reviewed imaging studies with the patient I have personally reviewed the radiological images as listed and agreed with the findings in the report. MR BREAST BILATERAL  W WO CONTRAST INC CAD  Result Date: 08/15/2022 CLINICAL DATA:   47 year old female status post recent bilateral breast biopsies as well as biopsies of a markedly enlarged right axillary lymph node. No atypia or malignancy or identified on any of the biopsies. The patient presents today for further evaluation. EXAM: BILATERAL BREAST MRI WITH AND WITHOUT CONTRAST TECHNIQUE: Multiplanar, multisequence MR images of both breasts were obtained prior to and following the intravenous administration of 10 ml of Vueway Three-dimensional MR images were rendered by post-processing of the original MR data on an independent workstation. The three-dimensional MR images were interpreted, and findings are reported in the following complete MRI report for this study. Three dimensional images were evaluated at the independent interpreting workstation using the DynaCAD thin client. COMPARISON:  Previous exam(s). FINDINGS: Breast composition: b. Scattered fibroglandular tissue. Background parenchymal enhancement: Moderate. Right breast: Susceptibility artifact from post biopsy marking clips are demonstrated in the central right breast at mid depth and upper outer right breast at anterior depth. Otherwise, no suspicious mass or abnormal enhancement. Left breast: Susceptibility artifact from post biopsy marking clip is seen within an area of progressive masslike enhancement in the central left breast at anterior depth. This likely represents post biopsy changes from the patient's recent stereotactic biopsy. No suspicious mass or abnormal enhancement within the remainder of the left breast. Lymph nodes: Numerous, markedly abnormal lymph nodes are demonstrated within the right axilla. The largest lymph node measures up to 5.5 cm and contains previous post biopsy clip. Additional abnormal lymph nodes extend it thin the level 1, level 2 and level 3 positions of the right axilla. There are no suspicious left axillary or internal mammary chain lymph nodes. Ancillary findings:  None. IMPRESSION: 1. Numerous,  markedly abnormal level 1, 2 and 3 right axillary lymph nodes. The largest node measures up to 5.5 cm and contains previous post biopsy clip. The findings are highly abnormal given the appearance and laterality. Repeat biopsy and/or surgical excision is recommended. Consider additional atypical infectious process. 2. No suspicious left axillary or internal mammary chain lymphadenopathy. 3. Post biopsy changes without suspicious MRI findings within the bilateral breasts. RECOMMENDATION: Repeat biopsy and/or surgical excision of a markedly abnormal right axillary lymph node is recommended if additional clinical causes for the patient's marked, unilateral right axillary lymphadenopathy is not identified. Consider additional atypical infectious processes. BI-RADS CATEGORY  4: Suspicious. Electronically Signed   By: Sande Brothers M.D.   On: 08/15/2022 13:58

## 2022-08-17 NOTE — Assessment & Plan Note (Signed)
I have reviewed imaging studies with the patient There is nothing abnormal noted on recent MRI of the breast but significant diffuse lymphadenopathy is noted in the right axilla We discussed referral to general surgery and further investigation to rule out lymphoproliferative disorder I recommend CT imaging of the chest, abdomen and pelvis for evaluation along with some blood work

## 2022-08-17 NOTE — Telephone Encounter (Signed)
Faxed referral to [facility] at [phone], received fax confirmation.

## 2022-08-24 ENCOUNTER — Other Ambulatory Visit: Payer: 59

## 2022-08-24 ENCOUNTER — Ambulatory Visit (HOSPITAL_BASED_OUTPATIENT_CLINIC_OR_DEPARTMENT_OTHER)
Admission: RE | Admit: 2022-08-24 | Discharge: 2022-08-24 | Disposition: A | Discharge status: Home / Self Care | Payer: 59 | Source: Ambulatory Visit | Attending: Hematology and Oncology | Admitting: Hematology and Oncology

## 2022-08-24 DIAGNOSIS — I700 Atherosclerosis of aorta: Secondary | ICD-10-CM | POA: Insufficient documentation

## 2022-08-24 DIAGNOSIS — R160 Hepatomegaly, not elsewhere classified: Secondary | ICD-10-CM | POA: Insufficient documentation

## 2022-08-24 DIAGNOSIS — I517 Cardiomegaly: Secondary | ICD-10-CM | POA: Insufficient documentation

## 2022-08-24 DIAGNOSIS — D479 Neoplasm of uncertain behavior of lymphoid, hematopoietic and related tissue, unspecified: Secondary | ICD-10-CM | POA: Diagnosis not present

## 2022-08-24 DIAGNOSIS — R599 Enlarged lymph nodes, unspecified: Secondary | ICD-10-CM | POA: Insufficient documentation

## 2022-08-24 MED ORDER — IOHEXOL 300 MG/ML  SOLN
100.0000 mL | Freq: Once | INTRAMUSCULAR | Status: AC | PRN
  Administered 2022-08-24: 47 mL via INTRAVENOUS

## 2022-08-29 ENCOUNTER — Ambulatory Visit (HOSPITAL_BASED_OUTPATIENT_CLINIC_OR_DEPARTMENT_OTHER): Payer: 59

## 2022-08-30 ENCOUNTER — Inpatient Hospital Stay (HOSPITAL_BASED_OUTPATIENT_CLINIC_OR_DEPARTMENT_OTHER): Payer: 59 | Admitting: Hematology and Oncology

## 2022-08-30 ENCOUNTER — Encounter: Payer: Self-pay | Admitting: Hematology and Oncology

## 2022-08-30 VITALS — BP 114/37 | HR 95 | Temp 97.7°F | Resp 18 | Ht 62.0 in | Wt 260.4 lb

## 2022-08-30 DIAGNOSIS — R590 Localized enlarged lymph nodes: Secondary | ICD-10-CM | POA: Diagnosis not present

## 2022-08-30 DIAGNOSIS — R928 Other abnormal and inconclusive findings on diagnostic imaging of breast: Secondary | ICD-10-CM | POA: Diagnosis not present

## 2022-08-30 NOTE — Progress Notes (Signed)
[facility] OFFICE PROGRESS NOTE  Patient Care Team: [facility] as PCP - General (Family Medicine) Kirkland Hun, MD as Consulting Physician (Obstetrics and Gynecology) Lorelee Cover, MD as Referring Physician (Student)  ASSESSMENT & PLAN:  Axillary lymphadenopathy I have reviewed multiple imaging studies dated back 11 years ago The subpectoral lymph node has been present before The inguinal lymph nodes were present before The gross abnormality is the right axilla lymph node which is large and that is not normal in size She is scheduled to see general surgery in about 10 days I recommend the patient to get excisional lymph node biopsy for definitive diagnosis I will see her back approximately a week after surgery to review test results The patient will call me after her appointment to let me know the date of her surgery  No orders of the defined types were placed in this encounter.   All questions were answered. The patient knows to call the clinic with any problems, questions or concerns. The total time spent in the appointment was 20 minutes encounter with patients including review of chart and various tests results, discussions about plan of care and coordination of care plan   Artis Delay, MD 08/30/2022 2:52 PM  INTERVAL HISTORY: Please see below for problem oriented charting. she returns for review of CT imaging results We reviewed multiple imaging studies dated back to 11 years ago We discussed plan of care She has appointment to see general surgery in about 12 days  REVIEW OF SYSTEMS:   Constitutional: Denies fevers, chills or abnormal weight loss Eyes: Denies blurriness of vision Ears, nose, mouth, throat, and face: Denies mucositis or sore throat Respiratory: Denies cough, dyspnea or wheezes Cardiovascular: Denies palpitation, chest discomfort or lower extremity swelling Gastrointestinal:  Denies nausea, heartburn or  change in bowel habits Skin: Denies abnormal skin rashes Lymphatics: Denies new lymphadenopathy or easy bruising Neurological:Denies numbness, tingling or new weaknesses Behavioral/Psych: Mood is stable, no new changes  All other systems were reviewed with the patient and are negative.  I have reviewed the past medical history, past surgical history, social history and family history with the patient and they are unchanged from previous note.  ALLERGIES:  is allergic to duloxetine hcl, latex, shellfish allergy, tree extract, morphine and codeine, and tape.  MEDICATIONS:  Current Outpatient Medications  Medication Sig Dispense Refill   Acetaminophen 500 MG capsule Take 500-1,000 mg by mouth every 6 (six) hours as needed for pain.     albuterol (PROAIR HFA) 108 (90 Base) MCG/ACT inhaler Inhale 2 puffs into the lungs every 6 (six) hours as needed for wheezing or shortness of breath. 1 Inhaler 0   budesonide (PULMICORT) 0.5 MG/2ML nebulizer solution TAKE 2 MLS BY NEBULIZATION 2 TIMES DAILY. DX CODE: CHRONIC ASTHMA J45.909 (Patient taking differently: Take 0.5 mg by nebulization 2 (two) times daily. DX CODE: CHRONIC ASTHMA J45.909) 120 mL 5   fluticasone (FLONASE) 50 MCG/ACT nasal spray PLACE 2 SPRAYS INTO BOTH NOSTRILS 2 (TWO) TIMES DAILY. (Patient taking differently: Place 2 sprays into both nostrils 2 (two) times daily.) 16 g 5   ipratropium-albuterol (DUONEB) 0.5-2.5 (3) MG/3ML SOLN TAKE 3 MLS BY NEBULIZATION EVERY 4 HOURS AS NEEDED (FOR WHEEZING/SHORTNESS OF BREATH). (Patient taking differently: Take 3 mLs by nebulization every 4 (four) hours as needed (for wheezing or shortness of breath).) 360 mL 5   lisinopril (ZESTRIL) 40 MG tablet Take 40 mg by mouth daily.     loratadine (CLARITIN)  10 MG tablet TAKE 10 MG BY MOUTH at night (Patient taking differently: Take 10 mg by mouth daily.) 90 tablet 1   montelukast (SINGULAIR) 10 MG tablet TAKE 10 MG BY MOUTH AT BEDTIME (Patient taking differently:  Take 10 mg by mouth at bedtime.) 30 tablet 5   Multiple Vitamins-Minerals (CENTRUM WOMEN) TABS Take 1 tablet by mouth daily with breakfast.     nystatin powder Apply 1 application. topically 3 (three) times daily as needed (to affected areas).     ondansetron (ZOFRAN-ODT) 8 MG disintegrating tablet Take 8 mg by mouth every 8 (eight) hours as needed for nausea or vomiting (dissolve orally).     oxyCODONE (ROXICODONE) 15 MG immediate release tablet Take 15 mg by mouth 4 (four) times daily.     pantoprazole (PROTONIX) 40 MG tablet Take 1 tablet (40 mg total) by mouth 2 (two) times daily. Take 40 mg by mouth at night 60 tablet 0   pregabalin (LYRICA) 300 MG capsule Take 300 mg by mouth in the morning and at bedtime.     PRESCRIPTION MEDICATION CPAP- Use during any time of rest     rizatriptan (MAXALT) 10 MG tablet Take 10 mg by mouth See admin instructions. Take 10 mg by mouth at onset of migraine and may repeat once in 2 hours, if no relief     tirzepatide (MOUNJARO) 7.5 MG/0.5ML Pen Inject 7.5 mg into the skin once a week.     tiZANidine (ZANAFLEX) 4 MG tablet Take 4 mg by mouth 3 (three) times daily.     triamcinolone cream (KENALOG) 0.1 % Apply 1 application. topically daily as needed (to affected sites- for itching or rashes).     valACYclovir (VALTREX) 500 MG tablet Take 500 mg by mouth daily.     No current facility-administered medications for this visit.    SUMMARY OF ONCOLOGIC HISTORY: She was previously treated by myself for iron deficiency anemia Last month, she told me she had abnormal mammogram leading to ultrasound guided biopsy I have reviewed outside records extensively with the patient and her mother We discussed mammogram and ultrasound finding as well as pathology report I also reviewed her internal records In 2014, she had palpable lymphadenopathy in the head and neck region Ultrasound only shows small lymphadenopathy.  Biopsy came back negative for malignancy The patient is  concerned she might have undiagnosed lupus She is also concerned about possible missed diagnosis of cancer  PHYSICAL EXAMINATION: ECOG PERFORMANCE STATUS: 0 - Asymptomatic GENERAL:alert, no distress and comfortable NEURO: alert & oriented x 3 with fluent speech, no focal motor/sensory deficits  LABORATORY DATA:  I have reviewed the data as listed    Component Value Date/Time   NA 137 08/17/2022 1301   NA 139 08/29/2012 1102   K 3.7 08/17/2022 1301   K 3.2 (L) 08/29/2012 1102   CL 105 08/17/2022 1301   CL 105 08/29/2012 1102   CO2 26 08/17/2022 1301   CO2 23 08/29/2012 1102   GLUCOSE 75 08/17/2022 1301   GLUCOSE 130 (H) 08/29/2012 1102   BUN 9 08/17/2022 1301   BUN 5.8 (L) 08/29/2012 1102   CREATININE 0.76 08/17/2022 1301   CREATININE 0.8 08/29/2012 1102   CALCIUM 9.1 08/17/2022 1301   CALCIUM 8.6 08/29/2012 1102   PROT 7.6 08/17/2022 1301   PROT 7.3 08/29/2012 1102   ALBUMIN 4.4 08/17/2022 1301   ALBUMIN 3.6 08/29/2012 1102   AST 15 08/17/2022 1301   AST 22 08/29/2012 1102   ALT  11 08/17/2022 1301   ALT 23 08/29/2012 1102   ALKPHOS 60 08/17/2022 1301   ALKPHOS 86 08/29/2012 1102   BILITOT 0.5 08/17/2022 1301   BILITOT 0.22 08/29/2012 1102   GFRNONAA >60 08/17/2022 1301   GFRAA >60 03/10/2017 1735    No results found for: "SPEP", "UPEP"  Lab Results  Component Value Date   WBC 10.1 08/17/2022   NEUTROABS 6.9 08/17/2022   HGB 11.7 (L) 08/17/2022   HCT 36.2 08/17/2022   MCV 78.5 (L) 08/17/2022   PLT 350 08/17/2022      Chemistry      Component Value Date/Time   NA 137 08/17/2022 1301   NA 139 08/29/2012 1102   K 3.7 08/17/2022 1301   K 3.2 (L) 08/29/2012 1102   CL 105 08/17/2022 1301   CL 105 08/29/2012 1102   CO2 26 08/17/2022 1301   CO2 23 08/29/2012 1102   BUN 9 08/17/2022 1301   BUN 5.8 (L) 08/29/2012 1102   CREATININE 0.76 08/17/2022 1301   CREATININE 0.8 08/29/2012 1102      Component Value Date/Time   CALCIUM 9.1 08/17/2022 1301   CALCIUM  8.6 08/29/2012 1102   ALKPHOS 60 08/17/2022 1301   ALKPHOS 86 08/29/2012 1102   AST 15 08/17/2022 1301   AST 22 08/29/2012 1102   ALT 11 08/17/2022 1301   ALT 23 08/29/2012 1102   BILITOT 0.5 08/17/2022 1301   BILITOT 0.22 08/29/2012 1102       RADIOGRAPHIC STUDIES: I have reviewed multiple imaging studies with the patient I have personally reviewed the radiological images as listed and agreed with the findings in the report. CT CHEST ABDOMEN PELVIS W CONTRAST  Result Date: 08/27/2022 CLINICAL DATA:  lymphoma. Recent breast biopsy demonstrating lymphoproliferative disorder. * Tracking Code: BO * EXAM: CT CHEST, ABDOMEN, AND PELVIS WITH CONTRAST TECHNIQUE: Multidetector CT imaging of the chest, abdomen and pelvis was performed following the standard protocol during bolus administration of intravenous contrast. RADIATION DOSE REDUCTION: This exam was performed according to the departmental dose-optimization program which includes automated exposure control, adjustment of the mA and/or kV according to patient size and/or use of iterative reconstruction technique. CONTRAST:  47mL OMNIPAQUE IOHEXOL 300 MG/ML  SOLN COMPARISON:  08/25/2021 chest radiograph. 01/16/2016 abdominopelvic CT. Chest CT 04/03/2012 FINDINGS: CT CHEST FINDINGS Cardiovascular: Normal caliber of the aorta and branch vessels. Mild cardiomegaly with trace pericardial fluid, likely physiologic. No central pulmonary embolism, on this non-dedicated study. Mediastinum/Nodes: No supraclavicular adenopathy. Right-sided axillary and subpectoral adenopathy. Index subpectoral node measures 1.2 cm on 14/8. Dominant right axillary nodal mass measures 5.0 by 3.6 cm on 22/8. No left axillary or subpectoral adenopathy. No mediastinal or hilar adenopathy.  No internal mammary adenopathy. Lungs/Pleura: No pleural fluid. Left lower lobe scarring. No suspicious pulmonary nodule or mass. Musculoskeletal: No dominant breast mass. No acute osseous  abnormality. CT ABDOMEN PELVIS FINDINGS Hepatobiliary: Mild caudate lobe prominence. Moderate hepatomegaly at 20.5 cm craniocaudal. No suspicious liver lesion. Normal gallbladder, without biliary ductal dilatation. Pancreas: Normal, without mass or ductal dilatation. Spleen: Normal in size, without focal abnormality. Adrenals/Urinary Tract: Normal adrenal glands. Normal kidneys, without hydronephrosis. Normal urinary bladder. Right pelvic calcification on 99/8 is present on the prior and favored to be vascular. Stomach/Bowel: Normal stomach, without wall thickening. Colonic stool burden suggests constipation. Normal terminal ileum and appendix. Normal small bowel. Vascular/Lymphatic: Aortic atherosclerosis. Multiple small abdominal retroperitoneal nodes, none pathologic by size criteria. Right external iliac index node measures 9 mm on 98/8 versus  7 mm on the prior CT. Right inguinal node measures 1.4 cm and 101/8 versus 9 mm on 07/22/2015. Left inguinal node measures 1.2 cm on 116/8 and is similar on the remote prior. Reproductive: Normal uterus and adnexa. Other: No significant free fluid. No evidence of omental or peritoneal disease. Musculoskeletal: Atypical appearance of the L5 vertebral body with mild irregularity of the superior endplate, similar and likely within normal variation. IMPRESSION: 1. Right-sided axillary and subpectoral adenopathy, consistent with the clinical history of lymphoma. 2. Interval enlargement of borderline to mildly enlarged right pelvic sidewall and inguinal nodes which are indeterminate for lymphoproliferative process versus reactive etiologies. 3. Hepatomegaly 4.  Possible constipation. 5.  Aortic Atherosclerosis (ICD10-I70.0). Electronically Signed   By: Jeronimo Greaves M.D.   On: 08/27/2022 13:40   MR BREAST BILATERAL W WO CONTRAST INC CAD  Result Date: 08/15/2022 CLINICAL DATA:  47 year old female status post recent bilateral breast biopsies as well as biopsies of a markedly  enlarged right axillary lymph node. No atypia or malignancy or identified on any of the biopsies. The patient presents today for further evaluation. EXAM: BILATERAL BREAST MRI WITH AND WITHOUT CONTRAST TECHNIQUE: Multiplanar, multisequence MR images of both breasts were obtained prior to and following the intravenous administration of 10 ml of Vueway Three-dimensional MR images were rendered by post-processing of the original MR data on an independent workstation. The three-dimensional MR images were interpreted, and findings are reported in the following complete MRI report for this study. Three dimensional images were evaluated at the independent interpreting workstation using the DynaCAD thin client. COMPARISON:  Previous exam(s). FINDINGS: Breast composition: b. Scattered fibroglandular tissue. Background parenchymal enhancement: Moderate. Right breast: Susceptibility artifact from post biopsy marking clips are demonstrated in the central right breast at mid depth and upper outer right breast at anterior depth. Otherwise, no suspicious mass or abnormal enhancement. Left breast: Susceptibility artifact from post biopsy marking clip is seen within an area of progressive masslike enhancement in the central left breast at anterior depth. This likely represents post biopsy changes from the patient's recent stereotactic biopsy. No suspicious mass or abnormal enhancement within the remainder of the left breast. Lymph nodes: Numerous, markedly abnormal lymph nodes are demonstrated within the right axilla. The largest lymph node measures up to 5.5 cm and contains previous post biopsy clip. Additional abnormal lymph nodes extend it thin the level 1, level 2 and level 3 positions of the right axilla. There are no suspicious left axillary or internal mammary chain lymph nodes. Ancillary findings:  None. IMPRESSION: 1. Numerous, markedly abnormal level 1, 2 and 3 right axillary lymph nodes. The largest node measures up to  5.5 cm and contains previous post biopsy clip. The findings are highly abnormal given the appearance and laterality. Repeat biopsy and/or surgical excision is recommended. Consider additional atypical infectious process. 2. No suspicious left axillary or internal mammary chain lymphadenopathy. 3. Post biopsy changes without suspicious MRI findings within the bilateral breasts. RECOMMENDATION: Repeat biopsy and/or surgical excision of a markedly abnormal right axillary lymph node is recommended if additional clinical causes for the patient's marked, unilateral right axillary lymphadenopathy is not identified. Consider additional atypical infectious processes. BI-RADS CATEGORY  4: Suspicious. Electronically Signed   By: Sande Brothers M.D.   On: 08/15/2022 13:58

## 2022-08-30 NOTE — Assessment & Plan Note (Signed)
I have reviewed multiple imaging studies dated back 11 years ago The subpectoral lymph node has been present before The inguinal lymph nodes were present before The gross abnormality is the right axilla lymph node which is large and that is not normal in size She is scheduled to see general surgery in about 10 days I recommend the patient to get excisional lymph node biopsy for definitive diagnosis I will see her back approximately a week after surgery to review test results The patient will call me after her appointment to let me know the date of her surgery

## 2022-09-11 ENCOUNTER — Ambulatory Visit: Payer: Self-pay | Admitting: General Surgery

## 2022-09-13 ENCOUNTER — Encounter (HOSPITAL_BASED_OUTPATIENT_CLINIC_OR_DEPARTMENT_OTHER): Payer: Self-pay | Admitting: General Surgery

## 2022-09-13 ENCOUNTER — Other Ambulatory Visit: Payer: Self-pay

## 2022-09-13 ENCOUNTER — Telehealth: Payer: Self-pay | Admitting: Hematology and Oncology

## 2022-09-13 NOTE — Progress Notes (Signed)
   09/13/22 1106  PAT Phone Screen  Is the patient taking a GLP-1 receptor agonist? Yes  Has the patient been informed on holding medication? Yes  Do You Have Diabetes? Yes  Do You Have Hypertension? Yes  Have You Ever Been to the ER for Asthma? No  Have You Taken Oral Steroids in the Past 3 Months? No  Do you Take Phenteramine or any Other Diet Drugs? No  Recent  Lab Work, EKG, CXR? No  Do you have a history of heart problems? No  Any Recent Hospitalizations? No  Height 5\' 2"  (1.575 m)  Weight 118.1 kg  Pat Appointment Scheduled Yes

## 2022-09-13 NOTE — Telephone Encounter (Signed)
Spoke with patient confirming upcoming appointment  

## 2022-09-14 ENCOUNTER — Encounter (HOSPITAL_BASED_OUTPATIENT_CLINIC_OR_DEPARTMENT_OTHER)
Admission: RE | Admit: 2022-09-14 | Discharge: 2022-09-14 | Disposition: A | Discharge status: Home / Self Care | Payer: 59 | Source: Ambulatory Visit | Attending: General Surgery | Admitting: General Surgery

## 2022-09-14 DIAGNOSIS — Z01812 Encounter for preprocedural laboratory examination: Secondary | ICD-10-CM | POA: Diagnosis present

## 2022-09-14 LAB — BASIC METABOLIC PANEL
Anion gap: 9 (ref 5–15)
BUN: 13 mg/dL (ref 6–20)
CO2: 22 mmol/L (ref 22–32)
Calcium: 8.6 mg/dL — ABNORMAL LOW (ref 8.9–10.3)
Chloride: 104 mmol/L (ref 98–111)
Creatinine, Ser: 0.75 mg/dL (ref 0.44–1.00)
GFR, Estimated: >60 mL/min (ref >60)
Glucose, Bld: 105 mg/dL — ABNORMAL HIGH (ref 70–99)
Potassium: 3.7 mmol/L (ref 3.5–5.1)
Sodium: 135 mmol/L (ref 135–145)

## 2022-09-14 MED ORDER — CHLORHEXIDINE GLUCONATE CLOTH 2 % EX PADS: 6.0000 | MEDICATED_PAD | Freq: Once | CUTANEOUS | Status: DC

## 2022-09-14 NOTE — Progress Notes (Signed)

## 2022-09-21 ENCOUNTER — Other Ambulatory Visit: Payer: Self-pay

## 2022-09-21 ENCOUNTER — Ambulatory Visit (HOSPITAL_BASED_OUTPATIENT_CLINIC_OR_DEPARTMENT_OTHER)
Admission: RE | Admit: 2022-09-21 | Discharge: 2022-09-21 | Disposition: A | Discharge status: Home / Self Care | Payer: 59 | Attending: General Surgery | Admitting: General Surgery

## 2022-09-21 ENCOUNTER — Encounter (HOSPITAL_BASED_OUTPATIENT_CLINIC_OR_DEPARTMENT_OTHER)
Admission: RE | Disposition: A | Discharge status: Home / Self Care | Payer: Self-pay | Source: Home / Self Care | Attending: General Surgery

## 2022-09-21 ENCOUNTER — Ambulatory Visit (HOSPITAL_BASED_OUTPATIENT_CLINIC_OR_DEPARTMENT_OTHER): Payer: 59 | Admitting: Anesthesiology

## 2022-09-21 ENCOUNTER — Encounter (HOSPITAL_BASED_OUTPATIENT_CLINIC_OR_DEPARTMENT_OTHER): Payer: Self-pay | Admitting: General Surgery

## 2022-09-21 DIAGNOSIS — R590 Localized enlarged lymph nodes: Secondary | ICD-10-CM

## 2022-09-21 DIAGNOSIS — Z7951 Long term (current) use of inhaled steroids: Secondary | ICD-10-CM | POA: Diagnosis not present

## 2022-09-21 DIAGNOSIS — K219 Gastro-esophageal reflux disease without esophagitis: Secondary | ICD-10-CM | POA: Insufficient documentation

## 2022-09-21 DIAGNOSIS — E119 Type 2 diabetes mellitus without complications: Secondary | ICD-10-CM | POA: Diagnosis not present

## 2022-09-21 DIAGNOSIS — F319 Bipolar disorder, unspecified: Secondary | ICD-10-CM | POA: Diagnosis not present

## 2022-09-21 DIAGNOSIS — Z79899 Other long term (current) drug therapy: Secondary | ICD-10-CM | POA: Diagnosis not present

## 2022-09-21 DIAGNOSIS — E785 Hyperlipidemia, unspecified: Secondary | ICD-10-CM | POA: Insufficient documentation

## 2022-09-21 DIAGNOSIS — J45909 Unspecified asthma, uncomplicated: Secondary | ICD-10-CM

## 2022-09-21 DIAGNOSIS — G4730 Sleep apnea, unspecified: Secondary | ICD-10-CM | POA: Diagnosis not present

## 2022-09-21 DIAGNOSIS — E669 Obesity, unspecified: Secondary | ICD-10-CM | POA: Insufficient documentation

## 2022-09-21 DIAGNOSIS — Z01818 Encounter for other preprocedural examination: Secondary | ICD-10-CM

## 2022-09-21 DIAGNOSIS — Z6841 Body Mass Index (BMI) 40.0 and over, adult: Secondary | ICD-10-CM | POA: Diagnosis not present

## 2022-09-21 DIAGNOSIS — Z79624 Long term (current) use of inhibitors of nucleotide synthesis: Secondary | ICD-10-CM | POA: Insufficient documentation

## 2022-09-21 DIAGNOSIS — M797 Fibromyalgia: Secondary | ICD-10-CM | POA: Diagnosis not present

## 2022-09-21 DIAGNOSIS — I10 Essential (primary) hypertension: Secondary | ICD-10-CM | POA: Diagnosis not present

## 2022-09-21 DIAGNOSIS — G4733 Obstructive sleep apnea (adult) (pediatric): Secondary | ICD-10-CM | POA: Diagnosis not present

## 2022-09-21 HISTORY — DX: Type 2 diabetes mellitus without complications: E11.9

## 2022-09-21 HISTORY — PX: AXILLARY LYMPH NODE BIOPSY: SHX5737

## 2022-09-21 LAB — GLUCOSE, CAPILLARY
Glucose-Capillary: 83 mg/dL (ref 70–99)
Glucose-Capillary: 82 mg/dL (ref 70–99)

## 2022-09-21 LAB — POCT PREGNANCY, URINE: Preg Test, Ur: NEGATIVE

## 2022-09-21 SURGERY — AXILLARY LYMPH NODE BIOPSY
Anesthesia: General | Site: Axilla | Laterality: Right

## 2022-09-21 MED ORDER — KETOROLAC TROMETHAMINE 30 MG/ML IJ SOLN
INTRAMUSCULAR | Status: DC | PRN
  Administered 2022-09-21: 30 mg via INTRAVENOUS

## 2022-09-21 MED ORDER — GABAPENTIN 300 MG PO CAPS
ORAL_CAPSULE | ORAL | Status: AC
  Filled 2022-09-21: qty 1

## 2022-09-21 MED ORDER — CELECOXIB 200 MG PO CAPS
200.0000 mg | ORAL_CAPSULE | ORAL | Status: AC
  Administered 2022-09-21: 200 mg via ORAL

## 2022-09-21 MED ORDER — FENTANYL CITRATE (PF) 100 MCG/2ML IJ SOLN
INTRAMUSCULAR | Status: DC | PRN
  Administered 2022-09-21 (×2): 50 ug via INTRAVENOUS

## 2022-09-21 MED ORDER — ACETAMINOPHEN 500 MG PO TABS: 1000.0000 mg | ORAL_TABLET | ORAL | Status: DC

## 2022-09-21 MED ORDER — ONDANSETRON HCL 4 MG/2ML IJ SOLN
INTRAMUSCULAR | Status: DC | PRN
  Administered 2022-09-21: 4 mg via INTRAVENOUS

## 2022-09-21 MED ORDER — OXYCODONE HCL 5 MG PO TABS: 5.0000 mg | ORAL_TABLET | Freq: Four times a day (QID) | ORAL | 0 refills | Status: DC | PRN

## 2022-09-21 MED ORDER — PHENYLEPHRINE HCL (PRESSORS) 10 MG/ML IV SOLN
INTRAVENOUS | Status: DC | PRN
  Administered 2022-09-21 (×2): 80 ug via INTRAVENOUS

## 2022-09-21 MED ORDER — ACETAMINOPHEN 160 MG/5ML PO SOLN: 325.0000 mg | ORAL | Status: DC | PRN

## 2022-09-21 MED ORDER — ACETAMINOPHEN 325 MG PO TABS: 325.0000 mg | ORAL_TABLET | ORAL | Status: DC | PRN

## 2022-09-21 MED ORDER — FENTANYL CITRATE (PF) 100 MCG/2ML IJ SOLN
INTRAMUSCULAR | Status: AC
  Filled 2022-09-21: qty 2

## 2022-09-21 MED ORDER — AMISULPRIDE (ANTIEMETIC) 5 MG/2ML IV SOLN: 10.0000 mg | Freq: Once | INTRAVENOUS | Status: DC | PRN

## 2022-09-21 MED ORDER — MIDAZOLAM HCL 5 MG/5ML IJ SOLN
INTRAMUSCULAR | Status: DC | PRN
  Administered 2022-09-21: 2 mg via INTRAVENOUS

## 2022-09-21 MED ORDER — GABAPENTIN 300 MG PO CAPS
300.0000 mg | ORAL_CAPSULE | ORAL | Status: AC
  Administered 2022-09-21: 300 mg via ORAL

## 2022-09-21 MED ORDER — PROPOFOL 10 MG/ML IV BOLUS
INTRAVENOUS | Status: AC
  Filled 2022-09-21: qty 20

## 2022-09-21 MED ORDER — OXYCODONE HCL 5 MG PO TABS: 5.0000 mg | ORAL_TABLET | Freq: Once | ORAL | Status: DC | PRN

## 2022-09-21 MED ORDER — MIDAZOLAM HCL 2 MG/2ML IJ SOLN
INTRAMUSCULAR | Status: AC
  Filled 2022-09-21: qty 2

## 2022-09-21 MED ORDER — BUPIVACAINE-EPINEPHRINE 0.25% -1:200000 IJ SOLN
INTRAMUSCULAR | Status: DC | PRN
  Administered 2022-09-21: 20 mL

## 2022-09-21 MED ORDER — ACETAMINOPHEN 10 MG/ML IV SOLN: 1000.0000 mg | Freq: Once | INTRAVENOUS | Status: DC | PRN

## 2022-09-21 MED ORDER — FENTANYL CITRATE (PF) 100 MCG/2ML IJ SOLN
25.0000 ug | INTRAMUSCULAR | Status: DC | PRN
  Administered 2022-09-21: 50 ug via INTRAVENOUS
  Administered 2022-09-21: 25 ug via INTRAVENOUS
  Administered 2022-09-21: 50 ug via INTRAVENOUS

## 2022-09-21 MED ORDER — LIDOCAINE 2% (20 MG/ML) 5 ML SYRINGE
INTRAMUSCULAR | Status: AC
  Filled 2022-09-21: qty 5

## 2022-09-21 MED ORDER — CEFAZOLIN SODIUM-DEXTROSE 2-4 GM/100ML-% IV SOLN
INTRAVENOUS | Status: AC
  Filled 2022-09-21: qty 100

## 2022-09-21 MED ORDER — PROPOFOL 10 MG/ML IV BOLUS
INTRAVENOUS | Status: DC | PRN
  Administered 2022-09-21: 200 mg via INTRAVENOUS

## 2022-09-21 MED ORDER — DEXAMETHASONE SODIUM PHOSPHATE 10 MG/ML IJ SOLN
INTRAMUSCULAR | Status: AC
  Filled 2022-09-21: qty 1

## 2022-09-21 MED ORDER — ACETAMINOPHEN 500 MG PO TABS
1000.0000 mg | ORAL_TABLET | Freq: Once | ORAL | Status: AC
  Administered 2022-09-21: 1000 mg via ORAL

## 2022-09-21 MED ORDER — OXYCODONE HCL 5 MG/5ML PO SOLN: 5.0000 mg | Freq: Once | ORAL | Status: DC | PRN

## 2022-09-21 MED ORDER — ONDANSETRON HCL 4 MG/2ML IJ SOLN
INTRAMUSCULAR | Status: AC
  Filled 2022-09-21: qty 2

## 2022-09-21 MED ORDER — LIDOCAINE HCL (CARDIAC) PF 100 MG/5ML IV SOSY
PREFILLED_SYRINGE | INTRAVENOUS | Status: DC | PRN
  Administered 2022-09-21: 40 mg via INTRAVENOUS

## 2022-09-21 MED ORDER — PROMETHAZINE HCL 25 MG/ML IJ SOLN: 6.2500 mg | INTRAMUSCULAR | Status: DC | PRN

## 2022-09-21 MED ORDER — SCOPOLAMINE 1 MG/3DAYS TD PT72
1.0000 | MEDICATED_PATCH | TRANSDERMAL | Status: DC
  Administered 2022-09-21: 1.5 mg via TRANSDERMAL

## 2022-09-21 MED ORDER — CELECOXIB 200 MG PO CAPS
ORAL_CAPSULE | ORAL | Status: AC
  Filled 2022-09-21: qty 1

## 2022-09-21 MED ORDER — CEFAZOLIN SODIUM-DEXTROSE 2-4 GM/100ML-% IV SOLN
2.0000 g | INTRAVENOUS | Status: AC
  Administered 2022-09-21: 2 g via INTRAVENOUS

## 2022-09-21 MED ORDER — DEXAMETHASONE SODIUM PHOSPHATE 4 MG/ML IJ SOLN
INTRAMUSCULAR | Status: DC | PRN
  Administered 2022-09-21: 10 mg via INTRAVENOUS

## 2022-09-21 MED ORDER — LACTATED RINGERS IV SOLN: INTRAVENOUS | Status: DC

## 2022-09-21 MED ORDER — PROPOFOL 500 MG/50ML IV EMUL
INTRAVENOUS | Status: DC | PRN
  Administered 2022-09-21: 150 ug/kg/min via INTRAVENOUS

## 2022-09-21 MED ORDER — SCOPOLAMINE 1 MG/3DAYS TD PT72
MEDICATED_PATCH | TRANSDERMAL | Status: AC
  Filled 2022-09-21: qty 1

## 2022-09-21 MED ORDER — ACETAMINOPHEN 500 MG PO TABS
ORAL_TABLET | ORAL | Status: AC
  Filled 2022-09-21: qty 2

## 2022-09-21 SURGICAL SUPPLY — 40 items
ADH SKN CLS APL DERMABOND .7 (GAUZE/BANDAGES/DRESSINGS) ×1
APL PRP STRL LF DISP 70% ISPRP (MISCELLANEOUS) ×1
APPLIER CLIP 11 MED OPEN (CLIP) ×1
APR CLP MED 11 20 MLT OPN (CLIP) ×1
BLADE SURG 15 STRL LF DISP TIS (BLADE) ×1 IMPLANT
BLADE SURG 15 STRL SS (BLADE) ×1
CANISTER SUCT 1200ML W/VALVE (MISCELLANEOUS) ×1 IMPLANT
CHLORAPREP W/TINT 26 (MISCELLANEOUS) ×1 IMPLANT
CLIP APPLIE 11 MED OPEN (CLIP) ×1 IMPLANT
COVER BACK TABLE 60X90IN (DRAPES) ×1 IMPLANT
COVER MAYO STAND STRL (DRAPES) ×1 IMPLANT
DERMABOND ADVANCED .7 DNX12 (GAUZE/BANDAGES/DRESSINGS) ×1 IMPLANT
DRAPE LAPAROSCOPIC ABDOMINAL (DRAPES) IMPLANT
DRAPE UTILITY XL STRL (DRAPES) ×1 IMPLANT
ELECT COATED BLADE 2.86 ST (ELECTRODE) ×1 IMPLANT
ELECT REM PT RETURN 9FT ADLT (ELECTROSURGICAL) ×1
ELECTRODE REM PT RTRN 9FT ADLT (ELECTROSURGICAL) ×1 IMPLANT
GLOVE SURG SS PI 7.0 STRL IVOR (GLOVE) IMPLANT
GLOVE SURG SS PI 7.5 STRL IVOR (GLOVE) IMPLANT
GOWN STRL REUS W/ TWL LRG LVL3 (GOWN DISPOSABLE) ×2 IMPLANT
GOWN STRL REUS W/TWL LRG LVL3 (GOWN DISPOSABLE) ×2
NDL HYPO 25X1 1.5 SAFETY (NEEDLE) ×1 IMPLANT
NEEDLE HYPO 25X1 1.5 SAFETY (NEEDLE) ×1 IMPLANT
NS IRRIG 1000ML POUR BTL (IV SOLUTION) ×1 IMPLANT
PACK BASIN DAY SURGERY FS (CUSTOM PROCEDURE TRAY) ×1 IMPLANT
PENCIL SMOKE EVACUATOR (MISCELLANEOUS) ×1 IMPLANT
SLEEVE SCD COMPRESS KNEE MED (STOCKING) IMPLANT
SPIKE FLUID TRANSFER (MISCELLANEOUS) IMPLANT
SPONGE T-LAP 18X18 ~~LOC~~+RFID (SPONGE) ×1 IMPLANT
STAPLER VISISTAT 35W (STAPLE) ×1 IMPLANT
SUT MON AB 4-0 PC3 18 (SUTURE) ×1 IMPLANT
SUT VIC AB 3-0 54X BRD REEL (SUTURE) IMPLANT
SUT VIC AB 3-0 BRD 54 (SUTURE)
SUT VIC AB 3-0 SH 27 (SUTURE) ×1
SUT VIC AB 3-0 SH 27X BRD (SUTURE) IMPLANT
SYR BULB EAR ULCER 3OZ GRN STR (SYRINGE) ×1 IMPLANT
SYR CONTROL 10ML LL (SYRINGE) ×1 IMPLANT
TOWEL GREEN STERILE FF (TOWEL DISPOSABLE) ×1 IMPLANT
TUBE CONNECTING 20X1/4 (TUBING) ×1 IMPLANT
YANKAUER SUCT BULB TIP NO VENT (SUCTIONS) ×1 IMPLANT

## 2022-09-21 NOTE — Discharge Instructions (Addendum)
No Tylenol until after 7pm today if needed No ibuprofen until after 8pm today if needed   Post Anesthesia Home Care Instructions  Activity: Get plenty of rest for the remainder of the day. A responsible individual must stay with you for 24 hours following the procedure.  For the next 24 hours, DO NOT: -Drive a car -Advertising copywriter -Drink alcoholic beverages -Take any medication unless instructed by your physician -Make any legal decisions or sign important papers.  Meals: Start with liquid foods such as gelatin or soup. Progress to regular foods as tolerated. Avoid greasy, spicy, heavy foods. If nausea and/or vomiting occur, drink only clear liquids until the nausea and/or vomiting subsides. Call your physician if vomiting continues.  Special Instructions/Symptoms: Your throat may feel dry or sore from the anesthesia or the breathing tube placed in your throat during surgery. If this causes discomfort, gargle with warm salt water. The discomfort should disappear within 24 hours.  If you had a scopolamine patch placed behind your ear for the management of post- operative nausea and/or vomiting:  1. The medication in the patch is effective for 72 hours, after which it should be removed.  Wrap patch in a tissue and discard in the trash. Wash hands thoroughly with soap and water. 2. You may remove the patch earlier than 72 hours if you experience unpleasant side effects which may include dry mouth, dizziness or visual disturbances. 3. Avoid touching the patch. Wash your hands with soap and water after contact with the patch.

## 2022-09-21 NOTE — Transfer of Care (Signed)
Immediate Anesthesia Transfer of Care Note  Patient: Cheyenne Arias  Procedure(s) Performed: RIGHT AXILLARY LYMPH NODE EXCISIONAL BIOPSY (Right: Axilla)  Patient Location: PACU  Anesthesia Type:General  Level of Consciousness: sedated  Airway & Oxygen Therapy: Patient Spontanous Breathing and Patient connected to face mask oxygen  Post-op Assessment: Report given to RN and Post -op Vital signs reviewed and stable  Post vital signs: Reviewed and stable  Last Vitals:  Vitals Value Taken Time  BP 101/59 09/21/22 1425  Temp 36.3 C 09/21/22 1425  Pulse 83 09/21/22 1429  Resp 14 09/21/22 1429  SpO2 100 % 09/21/22 1429  Vitals shown include unvalidated device data.  Last Pain:  Vitals:   09/21/22 1256  TempSrc: Oral  PainSc: 0-No pain      Patients Stated Pain Goal: 5 (09/21/22 1256)  Complications: No notable events documented.

## 2022-09-21 NOTE — H&P (Signed)
REFERRING PHYSICIAN: Artis Delay, MD PROVIDER: Lindell Noe, MD MRN: [id number] DOB: 08-03-1975 Subjective   Chief Complaint: New Consultation ( lymphoproliferative disorder)  History of Present Illness: Cheyenne Arias is a 47 y.o. female who is seen today as an office consultation for evaluation of New Consultation ( lymphoproliferative disorder)  We are asked to see the patient in consultation by Dr. Bertis Ruddy to evaluate her for an enlarged lymph node in the right axilla. The patient is a 47 year old black female who was recently found to have a large lymph node in the right axilla. She underwent mammograms and ultrasounds. She had some benign biopsies from both breast. She also had the lymph node from the right axilla biopsied which came back benign. Apparently there is some concern for possible lymphoma. Because of this we are asked to remove the lymph node from the right axilla in its entirety. She has had no significant weight loss recently. She does have some night sweats at home. She has no generalized aches and pains  Review of Systems: A complete review of systems was obtained from the patient. I have reviewed this information and discussed as appropriate with the patient. See HPI as well for other ROS.  ROS   Medical History: Past Medical History:  Diagnosis Date  Anemia  Anxiety  Arthritis  Diabetes mellitus without complication (CMS/HHS-HCC)  GERD (gastroesophageal reflux disease)  Hyperlipidemia  Hypertension   Patient Active Problem List  Diagnosis  Enlarged lymph nodes in armpit   Past Surgical History:  Procedure Laterality Date  CARPAL TUNNEL SURGERY  CESAREAN SECTION  JAW SURGERY    Allergies  Allergen Reactions  Shellfish Containing Products Anaphylaxis  Celebrex [Celecoxib] Unknown  Chaste Tree Extract Other (See Comments)  HAY FEVER SYMPTOMS  Latex Hives, Itching and Swelling  Morphine Unknown   Current Outpatient Medications on File Prior to  Visit  Medication Sig Dispense Refill  acetaminophen (TYLENOL) 500 mg capsule Take by mouth every 6 (six) hours as needed  albuterol MDI, PROVENTIL, VENTOLIN, PROAIR, HFA 90 mcg/actuation inhaler Inhale 2 inhalations into the lungs every 6 (six) hours as needed  budesonide (PULMICORT) 0.5 mg/2 mL nebulizer solution TAKE 2 MLS BY NEBULIZATION 2 TIMES DAILY. DX CODE: CHRONIC ASTHMA J45.909  fluticasone propionate (FLONASE) 50 mcg/actuation nasal spray Place 2 sprays into both nostrils 2 (two) times daily  ipratropium-albuteroL (DUO-NEB) nebulizer solution TAKE 3 MLS BY NEBULIZATION EVERY 4 HOURS AS NEEDED (FOR WHEEZING/SHORTNESS OF BREATH).  lisinopriL (ZESTRIL) 40 MG tablet  loratadine (CLARITIN) 10 mg tablet Take 1 tablet by mouth at bedtime  montelukast (SINGULAIR) 10 mg tablet Take 10 mg by mouth at bedtime  MOUNJARO 7.5 mg/0.5 mL PnIj  multivitamin (THERA TAB) tablet Take 1 tablet by mouth once daily  ondansetron (ZOFRAN-ODT) 4 MG disintegrating tablet  pantoprazole (PROTONIX) 40 MG DR tablet TAKE 1 TABLET BY MOUTH EVERY DAY *NEEDS OFFICE VISIT  pregabalin (LYRICA) 300 MG capsule Take 1 capsule by mouth 2 (two) times daily  rizatriptan (MAXALT) 10 MG tablet Take by mouth at bedtime as needed  tiZANidine (ZANAFLEX) 4 MG tablet Take by mouth  triamcinolone 0.1 % cream  valACYclovir (VALTREX) 500 MG tablet Take 1 tablet by mouth once daily   No current facility-administered medications on file prior to visit.   History reviewed. No pertinent family history.   Social History   Tobacco Use  Smoking Status Never  Smokeless Tobacco Never    Social History   Socioeconomic History  Marital status: Single  Tobacco  Use  Smoking status: Never  Smokeless tobacco: Never  Vaping Use  Vaping status: Never Used  Substance and Sexual Activity  Alcohol use: Not Currently  Drug use: Never   Social Determinants of Health   Financial Resource Strain: Low Risk (04/23/2022)  Received from  [facility]  Overall Financial Resource Strain (CARDIA)  Difficulty of Paying Living Expenses: Not hard at all  Food Insecurity: No Food Insecurity (04/23/2022)  Received from [facility]  Hunger Vital Sign  Worried About Running Out of Food in the Last Year: Never true  Ran Out of Food in the Last Year: Never true  Transportation Needs: No Transportation Needs (04/23/2022)  Received from [facility] - Transportation  Lack of Transportation (Medical): No  Lack of Transportation (Non-Medical): No  Physical Activity: Insufficiently Active (04/23/2022)  Received from [facility]  Exercise Vital Sign  Days of Exercise per Week: 6 days  Minutes of Exercise per Session: 20 min  Stress: No Stress Concern Present (04/23/2022)  Received from [facility] - Occupational Stress Questionnaire  Feeling of Stress : Not at all  Social Connections: Socially Integrated (04/23/2022)  Received from [facility]  Social Network  How would you rate your social network (family, work, friends)?: Good participation with social networks   Objective:   Vitals:  BP: 110/60  Pulse: (!) 122  Temp: 36.7 C (98 F)  Weight: (!) 113.8 kg (250 lb 12.8 oz)  Height: 157.5 cm (5\' 2" )  PainSc: 0-No pain   Body mass index is 45.87 kg/m.  Physical Exam Vitals reviewed.  Constitutional:  General: She is not in acute distress. Appearance: Normal appearance.  HENT:  Head: Normocephalic and atraumatic.  Right Ear: External ear normal.  Left Ear: External ear normal.  Nose: Nose normal.  Mouth/Throat:  Mouth: Mucous membranes are moist.  Pharynx: Oropharynx is clear.  Eyes:  General: No scleral icterus. Extraocular Movements: Extraocular movements intact.  Conjunctiva/sclera: Conjunctivae normal.  Pupils: Pupils are equal, round, and reactive to light.  Cardiovascular:  Rate and Rhythm: Normal rate and regular rhythm.  Pulses: Normal pulses.  Heart  sounds: Normal heart sounds.  Pulmonary:  Effort: Pulmonary effort is normal. No respiratory distress.  Breath sounds: Normal breath sounds.  Abdominal:  General: Bowel sounds are normal.  Palpations: Abdomen is soft.  Tenderness: There is no abdominal tenderness.  Musculoskeletal:  General: No swelling, tenderness or deformity. Normal range of motion.  Cervical back: Normal range of motion and neck supple.  Lymphadenopathy:  Comments: There is a 5 cm large palpable lymph node in the right axilla  Skin: General: Skin is warm and dry.  Coloration: Skin is not jaundiced.  Neurological:  General: No focal deficit present.  Mental Status: She is alert and oriented to person, place, and time.  Psychiatric:  Mood and Affect: Mood normal.  Behavior: Behavior normal.     Labs, Imaging and Diagnostic Testing:  Assessment and Plan:   Diagnoses and all orders for this visit:  Enlarged lymph nodes in armpit - [facility] Case Posting Request; Future   The patient has an enlarged lymph node in the right axilla. We are asked to remove it to rule out lymphoma. I have discussed with her in detail the risk and benefits of the operation as well as some of the technical aspects including the possibility of some chronic discomfort and fluid collection and she understands and wishes to proceed.

## 2022-09-21 NOTE — Interval H&P Note (Signed)
History and Physical Interval Note:  09/21/2022 12:32 PM  Cheyenne Arias  has presented today for surgery, with the diagnosis of ENLARGED LYMPH NODE RIGHT AXILLARY.  The various methods of treatment have been discussed with the patient and family. After consideration of risks, benefits and other options for treatment, the patient has consented to  Procedure(s): RIGHT AXILLARY LYMPH NODE EXCISIONAL BIOPSY (Right) as a surgical intervention.  The patient's history has been reviewed, patient examined, no change in status, stable for surgery.  I have reviewed the patient's chart and labs.  Questions were answered to the patient's satisfaction.     Chevis Pretty III

## 2022-09-21 NOTE — Op Note (Signed)
09/21/2022  2:21 PM  PATIENT:  Cheyenne Arias  47 y.o. female  PRE-OPERATIVE DIAGNOSIS:  ENLARGED LYMPH NODE RIGHT AXILLA  POST-OPERATIVE DIAGNOSIS:  ENLARGED LYMPH NODE RIGHT AXILLA  PROCEDURE:  Procedure(s): RIGHT AXILLARY LYMPH NODE EXCISIONAL BIOPSY (Right)  SURGEON:  Surgeon(s) and Role:    * Griselda Miner, MD - Primary  PHYSICIAN ASSISTANT:   ASSISTANTS: none   ANESTHESIA:   local and general  EBL:  10 mL   BLOOD ADMINISTERED:none  DRAINS: none   LOCAL MEDICATIONS USED:  MARCAINE     SPECIMEN:  Source of Specimen:  right axillary lymph node  DISPOSITION OF SPECIMEN:  PATHOLOGY  COUNTS:  YES  TOURNIQUET:  * No tourniquets in log *  DICTATION: .Dragon Dictation  After informed consent was obtained the patient was brought to the operating placed in the supine position on the operating table.  After adequate induction of general anesthesia the patient's right chest, breast, and axillary area were prepped with ChloraPrep, dry, and draped in usual sterile manner.  An appropriate timeout was performed.  The patient has a large lymph node in the right axilla that was palpable.  The area overlying this was infiltrated with quarter percent Marcaine.  A transverse incision was made in the low right axilla overlying the palpable lymph nodes.  The incision was carried through the skin and subcutaneous tissue sharply with the electrocautery until the deep right axillary space was entered.  Dissection was carried around the palpable lymph node sharply with the harmonic scalpel.  Once the entire lymph node was removed from the patient was sent to pathology for lymphoma protocol.  Hemostasis was achieved using the Bovie electrocautery.  The wound was then irrigated with saline and infiltrated with marcaine.  the deep layer of the wound was then closed with layers of interrupted 3-0 vicryl stitches.  the skin was then closed with a running 4-0 monocryl subcuticular stitch.  dermabond  dressings were applied.  the patient tolerated the procedure well.  at the end of the case all needle sponge and instrument counts were correct.  the patient was then awakened and taken to recovery in stable condition.  PLAN OF CARE: Discharge to home after PACU  PATIENT DISPOSITION:  PACU - hemodynamically stable.   Delay start of Pharmacological VTE agent (>24hrs) due to surgical blood loss or risk of bleeding: not applicable

## 2022-09-21 NOTE — Anesthesia Preprocedure Evaluation (Addendum)
Anesthesia Evaluation  Patient identified by MRN, date of birth, ID band Patient awake    Reviewed: Allergy & Precautions, NPO status , Patient's Chart, lab work & pertinent test results  Airway Mallampati: I  TM Distance: >3 FB Neck ROM: Full    Dental  (+) Teeth Intact, Dental Advisory Given   Pulmonary asthma , sleep apnea    breath sounds clear to auscultation       Cardiovascular hypertension, Pt. on medications  Rhythm:Regular Rate:Normal     Neuro/Psych  Headaches PSYCHIATRIC DISORDERS Anxiety Depression Bipolar Disorder    Neuromuscular disease    GI/Hepatic ,GERD  Medicated,,(+) Hepatitis -  Endo/Other  diabetes    Renal/GU negative Renal ROS     Musculoskeletal  (+) Arthritis ,  Fibromyalgia -  Abdominal   Peds  Hematology   Anesthesia Other Findings   Reproductive/Obstetrics                             Anesthesia Physical Anesthesia Plan  ASA: 3  Anesthesia Plan: General   Post-op Pain Management: Tylenol PO (pre-op)*   Induction: Intravenous  PONV Risk Score and Plan: 4 or greater and Ondansetron, Dexamethasone, Midazolam and Scopolamine patch - Pre-op  Airway Management Planned: LMA  Additional Equipment: None  Intra-op Plan:   Post-operative Plan: Extubation in OR  Informed Consent: I have reviewed the patients History and Physical, chart, labs and discussed the procedure including the risks, benefits and alternatives for the proposed anesthesia with the patient or authorized representative who has indicated his/her understanding and acceptance.     Dental advisory given  Plan Discussed with: CRNA  Anesthesia Plan Comments:        Anesthesia Quick Evaluation

## 2022-09-21 NOTE — Anesthesia Procedure Notes (Signed)
Procedure Name: LMA Insertion Date/Time: 09/21/2022 1:28 PM  Performed by: Burna Cash, CRNAPre-anesthesia Checklist: Patient identified, Emergency Drugs available, Suction available and Patient being monitored Patient Re-evaluated:Patient Re-evaluated prior to induction Oxygen Delivery Method: Circle system utilized Preoxygenation: Pre-oxygenation with 100% oxygen Induction Type: IV induction Ventilation: Mask ventilation without difficulty LMA: LMA inserted LMA Size: 4.0 Number of attempts: 1 Airway Equipment and Method: Bite block Placement Confirmation: positive ETCO2 Tube secured with: Tape Dental Injury: Teeth and Oropharynx as per pre-operative assessment

## 2022-09-22 ENCOUNTER — Encounter (HOSPITAL_BASED_OUTPATIENT_CLINIC_OR_DEPARTMENT_OTHER): Payer: Self-pay | Admitting: General Surgery

## 2022-09-22 NOTE — Anesthesia Postprocedure Evaluation (Signed)
Anesthesia Post Note  Patient: Cheyenne Arias  Procedure(s) Performed: RIGHT AXILLARY LYMPH NODE EXCISIONAL BIOPSY (Right: Axilla)     Patient location during evaluation: PACU Anesthesia Type: General Level of consciousness: awake and alert Pain management: pain level controlled Vital Signs Assessment: post-procedure vital signs reviewed and stable Respiratory status: spontaneous breathing, nonlabored ventilation, respiratory function stable and patient connected to nasal cannula oxygen Cardiovascular status: blood pressure returned to baseline and stable Postop Assessment: no apparent nausea or vomiting Anesthetic complications: no   No notable events documented.  Last Vitals:  Vitals:   09/21/22 1501 09/21/22 1533  BP: 106/64 120/74  Pulse: 73 80  Resp: 12 18  Temp:  (!) 36.2 C  SpO2: 93% 97%    Last Pain:  Vitals:   09/21/22 1533  TempSrc: Temporal  PainSc: 0-No pain                 Shelton Silvas

## 2022-09-27 ENCOUNTER — Telehealth: Payer: Self-pay

## 2022-09-27 LAB — SURGICAL PATHOLOGY

## 2022-09-27 NOTE — Telephone Encounter (Signed)
Returned her call. She is having transportation problems due to car breaking down. Sent a referral to the transportation program.

## 2022-09-28 ENCOUNTER — Telehealth: Payer: Self-pay

## 2022-09-28 ENCOUNTER — Encounter: Payer: Self-pay | Admitting: Oncology

## 2022-09-28 ENCOUNTER — Inpatient Hospital Stay: Payer: 59

## 2022-09-28 ENCOUNTER — Inpatient Hospital Stay: Payer: 59 | Attending: Hematology and Oncology | Admitting: Hematology and Oncology

## 2022-09-28 ENCOUNTER — Encounter: Payer: Self-pay | Admitting: Hematology and Oncology

## 2022-09-28 VITALS — BP 134/63 | HR 96 | Temp 99.4°F | Resp 18 | Ht 62.0 in | Wt 254.8 lb

## 2022-09-28 DIAGNOSIS — Z79899 Other long term (current) drug therapy: Secondary | ICD-10-CM | POA: Insufficient documentation

## 2022-09-28 DIAGNOSIS — D472 Monoclonal gammopathy: Secondary | ICD-10-CM

## 2022-09-28 DIAGNOSIS — R768 Other specified abnormal immunological findings in serum: Secondary | ICD-10-CM

## 2022-09-28 DIAGNOSIS — R590 Localized enlarged lymph nodes: Secondary | ICD-10-CM | POA: Insufficient documentation

## 2022-09-28 DIAGNOSIS — R928 Other abnormal and inconclusive findings on diagnostic imaging of breast: Secondary | ICD-10-CM | POA: Insufficient documentation

## 2022-09-28 DIAGNOSIS — D509 Iron deficiency anemia, unspecified: Secondary | ICD-10-CM | POA: Diagnosis present

## 2022-09-28 DIAGNOSIS — Z7951 Long term (current) use of inhaled steroids: Secondary | ICD-10-CM | POA: Diagnosis not present

## 2022-09-28 DIAGNOSIS — Z79624 Long term (current) use of inhibitors of nucleotide synthesis: Secondary | ICD-10-CM | POA: Insufficient documentation

## 2022-09-28 LAB — CMP (CANCER CENTER ONLY)
ALT: 10 U/L (ref 0–44)
AST: 12 U/L — ABNORMAL LOW (ref 15–41)
Albumin: 3.9 g/dL (ref 3.5–5.0)
Alkaline Phosphatase: 70 U/L (ref 38–126)
Anion gap: 7 (ref 5–15)
BUN: 7 mg/dL (ref 6–20)
CO2: 28 mmol/L (ref 22–32)
Calcium: 9.3 mg/dL (ref 8.9–10.3)
Chloride: 103 mmol/L (ref 98–111)
Creatinine: 0.71 mg/dL (ref 0.44–1.00)
GFR, Estimated: >60 mL/min (ref >60)
Glucose, Bld: 89 mg/dL (ref 70–99)
Potassium: 3.9 mmol/L (ref 3.5–5.1)
Sodium: 138 mmol/L (ref 135–145)
Total Bilirubin: 0.3 mg/dL (ref 0.3–1.2)
Total Protein: 7.2 g/dL (ref 6.5–8.1)

## 2022-09-28 LAB — CBC WITH DIFFERENTIAL (CANCER CENTER ONLY)
Abs Immature Granulocytes: 0.03 10*3/uL (ref 0.00–0.07)
Basophils Absolute: 0.1 10*3/uL (ref 0.0–0.1)
Basophils Relative: 1 %
Eosinophils Absolute: 0.4 10*3/uL (ref 0.0–0.5)
Eosinophils Relative: 4 %
HCT: 39.6 % (ref 36.0–46.0)
Hemoglobin: 12.2 g/dL (ref 12.0–15.0)
Immature Granulocytes: 0 %
Lymphocytes Relative: 18 %
Lymphs Abs: 2 10*3/uL (ref 0.7–4.0)
MCH: 24.1 pg — ABNORMAL LOW (ref 26.0–34.0)
MCHC: 30.8 g/dL (ref 30.0–36.0)
MCV: 78.1 fL — ABNORMAL LOW (ref 80.0–100.0)
Monocytes Absolute: 0.6 10*3/uL (ref 0.1–1.0)
Monocytes Relative: 5 %
Neutro Abs: 7.7 10*3/uL (ref 1.7–7.7)
Neutrophils Relative %: 72 %
Platelet Count: 358 10*3/uL (ref 150–400)
RBC: 5.07 MIL/uL (ref 3.87–5.11)
RDW: 15.1 % (ref 11.5–15.5)
WBC Count: 10.7 10*3/uL — ABNORMAL HIGH (ref 4.0–10.5)
nRBC: 0 % (ref 0.0–0.2)

## 2022-09-28 LAB — SEDIMENTATION RATE: Sed Rate: 22 mm/hr (ref 0–22)

## 2022-09-28 NOTE — Telephone Encounter (Signed)
Attempted to call x 3 this am to tell pathology resulted and ask if she can come in at 1140 today.

## 2022-09-28 NOTE — Progress Notes (Signed)
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 although it is unusual to see the large size The patient had multiple autoimmune panel in the past that came back unremarkable I recommend ordering myeloma panel to rule out monoclonal gammopathy I will call her with test results and will decide on future follow-up In the meantime, her wound appears to be healing well  Positive ANA (antinuclear antibody) She has remote history of positive ANA but all her other test has been unremarkable Her condition does not fit in typical autoimmune condition Her borderline abnormal MRI of the brain is unrelated She has no neurological deficit Observe only  Orders Placed This Encounter  Procedures   Kappa/lambda light chains    Standing Status:   Future    Number of Occurrences:   1    Standing Expiration Date:   09/28/2023   Multiple Myeloma Panel (SPEP&IFE w/QIG)    Standing Status:   Future    Number of Occurrences:   1    Standing Expiration Date:   09/28/2023   Sedimentation rate    Standing Status:   Future    Number of Occurrences:   1    Standing Expiration Date:   09/28/2023   CBC with Differential (Cancer Center Only)    Standing Status:   Future    Number of Occurrences:   1    Standing Expiration Date:   09/28/2023   CMP (Cancer Center only)    Standing Status:   Future    Number of Occurrences:   1    Standing Expiration Date:   09/28/2023   Beta 2 microglobulin, serum    Standing Status:   Future    Number of Occurrences:   1    Standing Expiration Date:   09/28/2023    The total time spent in the appointment was 30 minutes encounter with patients including review of chart and various tests results, discussions about plan of care and coordination of  care plan   All questions were answered. The patient knows to call the clinic with any problems, questions or concerns. No barriers to learning was detected.    Artis Delay, MD 6/14/20241:39 PM  INTERVAL HISTORY: Cheyenne Arias 47 y.o. female returns for further evaluation and review of test results She tolerated recent surgery well and her wound appears to be healing We spent a lot of time reviewing her final pathology report  SUMMARY OF HEMATOLOGIC HISTORY: She was previously treated by myself for iron deficiency anemia Last month, she told me she had abnormal mammogram leading to ultrasound guided biopsy I have reviewed outside records extensively with the patient and her mother We discussed mammogram and ultrasound finding as well as pathology report I also reviewed her internal records In 2014, she had palpable lymphadenopathy in the head and neck region Ultrasound only shows small lymphadenopathy.  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   I have reviewed the past medical history, past surgical history, social history and family history with the patient and they are unchanged from previous note.  ALLERGIES:  is allergic to duloxetine hcl, latex, shellfish  allergy, tree extract, morphine and codeine, and tape.  MEDICATIONS:  Current Outpatient Medications  Medication Sig Dispense Refill   Acetaminophen 500 MG capsule Take 500-1,000 mg by mouth every 6 (six) hours as needed for pain.     albuterol (PROAIR HFA) 108 (90 Base) MCG/ACT inhaler Inhale 2 puffs into the lungs every 6 (six) hours as needed for wheezing or shortness of breath. 1 Inhaler 0   budesonide (PULMICORT) 0.5 MG/2ML nebulizer solution TAKE 2 MLS BY NEBULIZATION 2 TIMES DAILY. DX CODE: CHRONIC ASTHMA J45.909 (Patient taking  differently: Take 0.5 mg by nebulization 2 (two) times daily. DX CODE: CHRONIC ASTHMA J45.909) 120 mL 5   fluticasone (FLONASE) 50 MCG/ACT nasal spray PLACE 2 SPRAYS INTO BOTH NOSTRILS 2 (TWO) TIMES DAILY. (Patient taking differently: Place 2 sprays into both nostrils 2 (two) times daily.) 16 g 5   hydrochlorothiazide (HYDRODIURIL) 25 MG tablet Take 25 mg by mouth daily.     ipratropium-albuterol (DUONEB) 0.5-2.5 (3) MG/3ML SOLN TAKE 3 MLS BY NEBULIZATION EVERY 4 HOURS AS NEEDED (FOR WHEEZING/SHORTNESS OF BREATH). (Patient taking differently: Take 3 mLs by nebulization every 4 (four) hours as needed (for wheezing or shortness of breath).) 360 mL 5   loratadine (CLARITIN) 10 MG tablet TAKE 10 MG BY MOUTH at night (Patient taking differently: Take 10 mg by mouth daily.) 90 tablet 1   metoprolol tartrate (LOPRESSOR) 25 MG tablet Take 25 mg by mouth 1 day or 1 dose.     montelukast (SINGULAIR) 10 MG tablet TAKE 10 MG BY MOUTH AT BEDTIME (Patient taking differently: Take 10 mg by mouth at bedtime.) 30 tablet 5   Multiple Vitamins-Minerals (CENTRUM WOMEN) TABS Take 1 tablet by mouth daily with breakfast.     nystatin powder Apply 1 application. topically 3 (three) times daily as needed (to affected areas).     ondansetron (ZOFRAN-ODT) 8 MG disintegrating tablet Take 8 mg by mouth every 8 (eight) hours as needed for nausea or vomiting (dissolve orally).     oxyCODONE (ROXICODONE) 15 MG immediate release tablet Take 15 mg by mouth 4 (four) times daily.     oxyCODONE (ROXICODONE) 5 MG immediate release tablet Take 1 tablet (5 mg total) by mouth every 6 (six) hours as needed for severe pain. 15 tablet 0   pantoprazole (PROTONIX) 40 MG tablet Take 1 tablet (40 mg total) by mouth 2 (two) times daily. Take 40 mg by mouth at night 60 tablet 0   pregabalin (LYRICA) 300 MG capsule Take 300 mg by mouth in the morning and at bedtime.     PRESCRIPTION MEDICATION CPAP- Use during any time of rest     rizatriptan (MAXALT)  10 MG tablet Take 10 mg by mouth See admin instructions. Take 10 mg by mouth at onset of migraine and may repeat once in 2 hours, if no relief     tirzepatide (MOUNJARO) 7.5 MG/0.5ML Pen Inject 7.5 mg into the skin once a week.     tiZANidine (ZANAFLEX) 4 MG tablet Take 4 mg by mouth 3 (three) times daily.     triamcinolone cream (KENALOG) 0.1 % Apply 1 application. topically daily as needed (to affected sites- for itching or rashes).     valACYclovir (VALTREX) 500 MG tablet Take 500 mg by mouth daily.     No current facility-administered medications for this visit.     REVIEW OF SYSTEMS:   Constitutional: Denies fevers, chills or night sweats Eyes: Denies blurriness of vision Ears, nose, mouth, throat, and  face: Denies mucositis or sore throat Respiratory: Denies cough, dyspnea or wheezes Cardiovascular: Denies palpitation, chest discomfort or lower extremity swelling Gastrointestinal:  Denies nausea, heartburn or change in bowel habits Skin: Denies abnormal skin rashes Lymphatics: Denies new lymphadenopathy or easy bruising Neurological:Denies numbness, tingling or new weaknesses Behavioral/Psych: Mood is stable, no new changes  All other systems were reviewed with the patient and are negative.  PHYSICAL EXAMINATION: ECOG PERFORMANCE STATUS: 0 - Asymptomatic  Vitals:   09/28/22 1305  BP: 134/63  Pulse: 96  Resp: 18  Temp: 99.4 F (37.4 C)  SpO2: 99%   Filed Weights   09/28/22 1305  Weight: 254 lb 12.8 oz (115.6 kg)    GENERAL:alert, no distress and comfortable SKIN: Noted well-healed surgical scar NEURO: alert & oriented x 3 with fluent speech, no focal motor/sensory deficits  LABORATORY DATA:  I have reviewed the data as listed     Component Value Date/Time   NA 135 09/14/2022 1500   NA 139 08/29/2012 1102   K 3.7 09/14/2022 1500   K 3.2 (L) 08/29/2012 1102   CL 104 09/14/2022 1500   CL 105 08/29/2012 1102   CO2 22 09/14/2022 1500   CO2 23 08/29/2012 1102    GLUCOSE 105 (H) 09/14/2022 1500   GLUCOSE 130 (H) 08/29/2012 1102   BUN 13 09/14/2022 1500   BUN 5.8 (L) 08/29/2012 1102   CREATININE 0.75 09/14/2022 1500   CREATININE 0.76 08/17/2022 1301   CREATININE 0.8 08/29/2012 1102   CALCIUM 8.6 (L) 09/14/2022 1500   CALCIUM 8.6 08/29/2012 1102   PROT 7.6 08/17/2022 1301   PROT 7.3 08/29/2012 1102   ALBUMIN 4.4 08/17/2022 1301   ALBUMIN 3.6 08/29/2012 1102   AST 15 08/17/2022 1301   AST 22 08/29/2012 1102   ALT 11 08/17/2022 1301   ALT 23 08/29/2012 1102   ALKPHOS 60 08/17/2022 1301   ALKPHOS 86 08/29/2012 1102   BILITOT 0.5 08/17/2022 1301   BILITOT 0.22 08/29/2012 1102   GFRNONAA >60 09/14/2022 1500   GFRNONAA >60 08/17/2022 1301   GFRAA >60 03/10/2017 1735    No results found for: "SPEP", "UPEP"  Lab Results  Component Value Date   WBC 10.7 (H) 09/28/2022   NEUTROABS 7.7 09/28/2022   HGB 12.2 09/28/2022   HCT 39.6 09/28/2022   MCV 78.1 (L) 09/28/2022   PLT 358 09/28/2022      Chemistry      Component Value Date/Time   NA 135 09/14/2022 1500   NA 139 08/29/2012 1102   K 3.7 09/14/2022 1500   K 3.2 (L) 08/29/2012 1102   CL 104 09/14/2022 1500   CL 105 08/29/2012 1102   CO2 22 09/14/2022 1500   CO2 23 08/29/2012 1102   BUN 13 09/14/2022 1500   BUN 5.8 (L) 08/29/2012 1102   CREATININE 0.75 09/14/2022 1500   CREATININE 0.76 08/17/2022 1301   CREATININE 0.8 08/29/2012 1102      Component Value Date/Time   CALCIUM 8.6 (L) 09/14/2022 1500   CALCIUM 8.6 08/29/2012 1102   ALKPHOS 60 08/17/2022 1301   ALKPHOS 86 08/29/2012 1102   AST 15 08/17/2022 1301   AST 22 08/29/2012 1102   ALT 11 08/17/2022 1301   ALT 23 08/29/2012 1102   BILITOT 0.5 08/17/2022 1301   BILITOT 0.22 08/29/2012 1102

## 2022-09-28 NOTE — Assessment & Plan Note (Signed)
She has remote history of positive ANA but all her other test has been unremarkable Her condition does not fit in typical autoimmune condition Her borderline abnormal MRI of the brain is unrelated She has no neurological deficit Observe only

## 2022-09-28 NOTE — Assessment & Plan Note (Signed)
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

## 2022-09-28 NOTE — Telephone Encounter (Signed)
Left her a  message asking her to call the office back about coming in earlier today for appt.

## 2022-09-29 LAB — BETA 2 MICROGLOBULIN, SERUM: Beta-2 Microglobulin: 1.7 mg/L (ref 0.6–2.4)

## 2022-10-01 LAB — KAPPA/LAMBDA LIGHT CHAINS
Kappa free light chain: 20.1 mg/L — ABNORMAL HIGH (ref 3.3–19.4)
Kappa, lambda light chain ratio: 1.83 — ABNORMAL HIGH (ref 0.26–1.65)
Lambda free light chains: 11 mg/L (ref 5.7–26.3)

## 2022-10-11 LAB — MULTIPLE MYELOMA PANEL, SERUM
Albumin SerPl Elph-Mcnc: 4.1 g/dL (ref 2.9–4.4)
Albumin/Glob SerPl: 1.1 (ref 0.7–1.7)
Alpha 1: 0.3 g/dL (ref 0.0–0.4)
Alpha2 Glob SerPl Elph-Mcnc: 1.1 g/dL — ABNORMAL HIGH (ref 0.4–1.0)
B-Globulin SerPl Elph-Mcnc: 1.2 g/dL (ref 0.7–1.3)
Gamma Glob SerPl Elph-Mcnc: 1.6 g/dL (ref 0.4–1.8)
Globulin, Total: 4.1 g/dL — ABNORMAL HIGH (ref 2.2–3.9)
IgA: 302 mg/dL (ref 87–352)
IgG (Immunoglobin G), Serum: 1413 mg/dL (ref 586–1602)
IgM (Immunoglobulin M), Srm: 121 mg/dL (ref 26–217)
Total Protein ELP: 8.2 g/dL (ref 6.0–8.5)

## 2022-10-12 ENCOUNTER — Telehealth: Payer: Self-pay | Admitting: Hematology and Oncology

## 2022-10-12 ENCOUNTER — Other Ambulatory Visit: Payer: Self-pay | Admitting: Hematology and Oncology

## 2022-10-12 DIAGNOSIS — R590 Localized enlarged lymph nodes: Secondary | ICD-10-CM

## 2022-10-12 NOTE — Telephone Encounter (Signed)
Left patient a vm regarding upcoming appointment  

## 2022-10-12 NOTE — Telephone Encounter (Signed)
I reviewed test with the patient We discussed either the patient obtain second opinion elsewhere versus repeat imaging study and follow-up with me in 3 months She agrees to follow-up in 3 months I will send scheduling message to see her on September 26 with labs and CT to be done on the 23rd She expressed verbal understanding

## 2022-12-28 ENCOUNTER — Telehealth: Payer: Self-pay

## 2022-12-28 NOTE — Telephone Encounter (Signed)
-----   Message from Artis Delay sent at 12/28/2022  1:16 PM EDT ----- Pls move her appt to 10/4, her CT is not until 9/30

## 2022-12-28 NOTE — Telephone Encounter (Signed)
Called and rescheduled appts. She is aware of 10/4 appt.

## 2023-01-07 ENCOUNTER — Inpatient Hospital Stay: Payer: 59

## 2023-01-10 ENCOUNTER — Inpatient Hospital Stay: Payer: 59 | Admitting: Hematology and Oncology

## 2023-01-14 ENCOUNTER — Inpatient Hospital Stay: Payer: 59 | Attending: Hematology and Oncology

## 2023-01-14 ENCOUNTER — Ambulatory Visit (HOSPITAL_BASED_OUTPATIENT_CLINIC_OR_DEPARTMENT_OTHER)
Admission: RE | Admit: 2023-01-14 | Discharge: 2023-01-14 | Disposition: A | Discharge status: Home / Self Care | Payer: 59 | Source: Ambulatory Visit | Attending: Hematology and Oncology | Admitting: Hematology and Oncology

## 2023-01-14 ENCOUNTER — Ambulatory Visit (HOSPITAL_COMMUNITY): Payer: 59

## 2023-01-14 DIAGNOSIS — R590 Localized enlarged lymph nodes: Secondary | ICD-10-CM | POA: Diagnosis present

## 2023-01-14 MED ORDER — IOHEXOL 300 MG/ML  SOLN
100.0000 mL | Freq: Once | INTRAMUSCULAR | Status: AC | PRN
  Administered 2023-01-14: 85 mL via INTRAVENOUS

## 2023-01-15 ENCOUNTER — Inpatient Hospital Stay: Payer: 59 | Attending: Hematology and Oncology

## 2023-01-15 DIAGNOSIS — R928 Other abnormal and inconclusive findings on diagnostic imaging of breast: Secondary | ICD-10-CM | POA: Diagnosis present

## 2023-01-15 DIAGNOSIS — Z79624 Long term (current) use of inhibitors of nucleotide synthesis: Secondary | ICD-10-CM | POA: Diagnosis not present

## 2023-01-15 DIAGNOSIS — Z79899 Other long term (current) drug therapy: Secondary | ICD-10-CM | POA: Diagnosis not present

## 2023-01-15 DIAGNOSIS — R590 Localized enlarged lymph nodes: Secondary | ICD-10-CM | POA: Insufficient documentation

## 2023-01-15 DIAGNOSIS — D509 Iron deficiency anemia, unspecified: Secondary | ICD-10-CM | POA: Diagnosis not present

## 2023-01-15 LAB — CBC WITH DIFFERENTIAL (CANCER CENTER ONLY)
Abs Immature Granulocytes: 0.02 10*3/uL (ref 0.00–0.07)
Basophils Absolute: 0.1 10*3/uL (ref 0.0–0.1)
Basophils Relative: 1 %
Eosinophils Absolute: 0.2 10*3/uL (ref 0.0–0.5)
Eosinophils Relative: 2 %
HCT: 40.9 % (ref 36.0–46.0)
Hemoglobin: 13.1 g/dL (ref 12.0–15.0)
Immature Granulocytes: 0 %
Lymphocytes Relative: 19 %
Lymphs Abs: 1.8 10*3/uL (ref 0.7–4.0)
MCH: 24.4 pg — ABNORMAL LOW (ref 26.0–34.0)
MCHC: 32 g/dL (ref 30.0–36.0)
MCV: 76.2 fL — ABNORMAL LOW (ref 80.0–100.0)
Monocytes Absolute: 0.5 10*3/uL (ref 0.1–1.0)
Monocytes Relative: 6 %
Neutro Abs: 6.8 10*3/uL (ref 1.7–7.7)
Neutrophils Relative %: 72 %
Platelet Count: 376 10*3/uL (ref 150–400)
RBC: 5.37 MIL/uL — ABNORMAL HIGH (ref 3.87–5.11)
RDW: 16.8 % — ABNORMAL HIGH (ref 11.5–15.5)
WBC Count: 9.4 10*3/uL (ref 4.0–10.5)
nRBC: 0 % (ref 0.0–0.2)

## 2023-01-15 LAB — CMP (CANCER CENTER ONLY)
ALT: 9 U/L (ref 0–44)
AST: 13 U/L — ABNORMAL LOW (ref 15–41)
Albumin: 4.3 g/dL (ref 3.5–5.0)
Alkaline Phosphatase: 70 U/L (ref 38–126)
Anion gap: 7 (ref 5–15)
BUN: 9 mg/dL (ref 6–20)
CO2: 25 mmol/L (ref 22–32)
Calcium: 9.7 mg/dL (ref 8.9–10.3)
Chloride: 106 mmol/L (ref 98–111)
Creatinine: 0.67 mg/dL (ref 0.44–1.00)
GFR, Estimated: >60 mL/min (ref >60)
Glucose, Bld: 98 mg/dL (ref 70–99)
Potassium: 3.5 mmol/L (ref 3.5–5.1)
Sodium: 138 mmol/L (ref 135–145)
Total Bilirubin: 0.4 mg/dL (ref 0.3–1.2)
Total Protein: 7.6 g/dL (ref 6.5–8.1)

## 2023-01-15 LAB — LACTATE DEHYDROGENASE: LDH: 164 U/L (ref 98–192)

## 2023-01-18 ENCOUNTER — Encounter: Payer: Self-pay | Admitting: Hematology and Oncology

## 2023-01-18 ENCOUNTER — Inpatient Hospital Stay (HOSPITAL_BASED_OUTPATIENT_CLINIC_OR_DEPARTMENT_OTHER): Payer: 59 | Admitting: Hematology and Oncology

## 2023-01-18 ENCOUNTER — Telehealth: Payer: Self-pay | Admitting: Hematology and Oncology

## 2023-01-18 DIAGNOSIS — R590 Localized enlarged lymph nodes: Secondary | ICD-10-CM | POA: Diagnosis not present

## 2023-01-18 DIAGNOSIS — R928 Other abnormal and inconclusive findings on diagnostic imaging of breast: Secondary | ICD-10-CM | POA: Diagnosis not present

## 2023-01-18 NOTE — Telephone Encounter (Signed)
Per IB Message on 01/18/23; I scheduled patient lab appointment and est visit appointment one week later. Patient is aware of the date and time of appointments.

## 2023-01-18 NOTE — Assessment & Plan Note (Signed)
I have reviewed CT imaging which showed no residual lymphadenopathy I plan to repeat imaging study again in 6 months and she agreed

## 2023-01-18 NOTE — Progress Notes (Signed)
HEMATOLOGY-ONCOLOGY ELECTRONIC VISIT PROGRESS NOTE  Patient Care Team: [facility], [facility] as PCP - General (Family Medicine) Kirkland Hun, MD as Consulting Physician (Obstetrics and Gynecology) Lorelee Cover, MD as Referring Physician (Student)  I connected with the patient via telephone conference and verified that I am speaking with the correct person using two identifiers. The patient's location is at home and I am providing care from the [facility] I discussed the limitations, risks, security and privacy concerns of performing an evaluation and management service by e-visits and the availability of in person appointments.  I also discussed with the patient that there may be a patient responsible charge related to this service. The patient expressed understanding and agreed to proceed.   ASSESSMENT & PLAN:  Axillary lymphadenopathy I have reviewed CT imaging which showed no residual lymphadenopathy I plan to repeat imaging study again in 6 months and she agreed  Abnormal mammogram She had previous abnormal mammogram.  MRI showed no breast lesions. I recommend the patient to continue on diagnostic mammogram every 6 months, overdue now She will call mammogram center to get that scheduled  Orders Placed This Encounter  Procedures   CT CHEST W CONTRAST    Standing Status:   Future    Standing Expiration Date:   01/18/2024    Order Specific Question:   If indicated for the ordered procedure, I authorize the administration of contrast media per Radiology protocol    Answer:   Yes    Order Specific Question:   Preferred imaging location?    Answer:   [facility]    Order Specific Question:   Radiology Contrast Protocol - do NOT remove file path    Answer:   [email or web address]    Order Specific Question:   Is patient pregnant?    Answer:   No   CBC with Differential (Cancer Center Only)    Standing Status:    Future    Standing Expiration Date:   01/18/2024   CMP (Cancer Center only)    Standing Status:   Future    Standing Expiration Date:   01/18/2024   Lactate dehydrogenase    Standing Status:   Future    Standing Expiration Date:   01/18/2024    INTERVAL HISTORY: Please see below for problem oriented charting. The purpose of today's discussion is to review test results.  She has been feeling well.  We discussed findings on blood work, and CT imaging study as well as discussed future follow-up  SUMMARY OF HEMATOLOGIC HISTORY: She was previously treated by myself for iron deficiency anemia Last month, she told me she had abnormal mammogram leading to ultrasound guided biopsy I have reviewed outside records extensively with the patient and her mother We discussed mammogram and ultrasound finding as well as pathology report I also reviewed her internal records In 2014, she had palpable lymphadenopathy in the head and neck region Ultrasound only shows small lymphadenopathy.  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    REVIEW OF SYSTEMS:   Constitutional: Denies fevers, chills or abnormal weight loss Eyes: Denies blurriness of vision Ears, nose, mouth, throat, and face: Denies mucositis or sore throat Respiratory: Denies cough, dyspnea or wheezes Cardiovascular: Denies palpitation, chest discomfort Gastrointestinal:  Denies nausea, heartburn or change in bowel habits Skin: Denies abnormal skin  rashes Lymphatics: Denies new lymphadenopathy or easy bruising Neurological:Denies numbness, tingling or new weaknesses Behavioral/Psych: Mood is stable, no new changes  Extremities: No lower extremity edema All other systems were reviewed with the patient and are negative.  I have reviewed the past medical  history, past surgical history, social history and family history with the patient and they are unchanged from previous note.  ALLERGIES:  is allergic to duloxetine hcl, latex, shellfish allergy, tree extract, morphine and codeine, and tape.  MEDICATIONS:  Current Outpatient Medications  Medication Sig Dispense Refill   Acetaminophen 500 MG capsule Take 500-1,000 mg by mouth every 6 (six) hours as needed for pain.     albuterol (PROAIR HFA) 108 (90 Base) MCG/ACT inhaler Inhale 2 puffs into the lungs every 6 (six) hours as needed for wheezing or shortness of breath. 1 Inhaler 0   budesonide (PULMICORT) 0.5 MG/2ML nebulizer solution TAKE 2 MLS BY NEBULIZATION 2 TIMES DAILY. DX CODE: CHRONIC ASTHMA J45.909 (Patient taking differently: Take 0.5 mg by nebulization 2 (two) times daily. DX CODE: CHRONIC ASTHMA J45.909) 120 mL 5   fluticasone (FLONASE) 50 MCG/ACT nasal spray PLACE 2 SPRAYS INTO BOTH NOSTRILS 2 (TWO) TIMES DAILY. (Patient taking differently: Place 2 sprays into both nostrils 2 (two) times daily.) 16 g 5   hydrochlorothiazide (HYDRODIURIL) 25 MG tablet Take 25 mg by mouth daily.     ipratropium-albuterol (DUONEB) 0.5-2.5 (3) MG/3ML SOLN TAKE 3 MLS BY NEBULIZATION EVERY 4 HOURS AS NEEDED (FOR WHEEZING/SHORTNESS OF BREATH). (Patient taking differently: Take 3 mLs by nebulization every 4 (four) hours as needed (for wheezing or shortness of breath).) 360 mL 5   loratadine (CLARITIN) 10 MG tablet TAKE 10 MG BY MOUTH at night (Patient taking differently: Take 10 mg by mouth daily.) 90 tablet 1   metoprolol tartrate (LOPRESSOR) 25 MG tablet Take 25 mg by mouth 1 day or 1 dose.     montelukast (SINGULAIR) 10 MG tablet TAKE 10 MG BY MOUTH AT BEDTIME (Patient taking differently: Take 10 mg by mouth at bedtime.) 30 tablet 5   Multiple Vitamins-Minerals (CENTRUM WOMEN) TABS Take 1 tablet by mouth daily with breakfast.     nystatin powder Apply 1 application. topically 3 (three) times daily as needed (to  affected areas).     ondansetron (ZOFRAN-ODT) 8 MG disintegrating tablet Take 8 mg by mouth every 8 (eight) hours as needed for nausea or vomiting (dissolve orally).     oxyCODONE (ROXICODONE) 15 MG immediate release tablet Take 15 mg by mouth 4 (four) times daily.     oxyCODONE (ROXICODONE) 5 MG immediate release tablet Take 1 tablet (5 mg total) by mouth every 6 (six) hours as needed for severe pain. 15 tablet 0   pantoprazole (PROTONIX) 40 MG tablet Take 1 tablet (40 mg total) by mouth 2 (two) times daily. Take 40 mg by mouth at night 60 tablet 0   pregabalin (LYRICA) 300 MG capsule Take 300 mg by mouth in the morning and at bedtime.     PRESCRIPTION MEDICATION CPAP- Use during any time of rest     rizatriptan (MAXALT) 10 MG tablet Take 10 mg by mouth See admin instructions. Take 10 mg by mouth at onset of migraine and may repeat once in 2 hours, if no relief     tirzepatide (MOUNJARO) 7.5 MG/0.5ML Pen Inject 7.5 mg into the skin once a week.     tiZANidine (ZANAFLEX) 4 MG tablet Take 4 mg by mouth 3 (three) times daily.  triamcinolone cream (KENALOG) 0.1 % Apply 1 application. topically daily as needed (to affected sites- for itching or rashes).     valACYclovir (VALTREX) 500 MG tablet Take 500 mg by mouth daily.     No current facility-administered medications for this visit.    PHYSICAL EXAMINATION: ECOG PERFORMANCE STATUS: 0 - Asymptomatic  LABORATORY DATA:  I have reviewed the data as listed    Latest Ref Rng & Units 01/15/2023    2:14 PM 09/28/2022    1:24 PM 09/14/2022    3:00 PM  CMP  Glucose 70 - 99 mg/dL 98  89  604   BUN 6 - 20 mg/dL 9  7  13    Creatinine 0.44 - 1.00 mg/dL 5.40  9.81  1.91   Sodium 135 - 145 mmol/L 138  138  135   Potassium 3.5 - 5.1 mmol/L 3.5  3.9  3.7   Chloride 98 - 111 mmol/L 106  103  104   CO2 22 - 32 mmol/L 25  28  22    Calcium 8.9 - 10.3 mg/dL 9.7  9.3  8.6   Total Protein 6.5 - 8.1 g/dL 7.6  7.2    Total Bilirubin 0.3 - 1.2 mg/dL 0.4  0.3     Alkaline Phos 38 - 126 U/L 70  70    AST 15 - 41 U/L 13  12    ALT 0 - 44 U/L 9  10      Lab Results  Component Value Date   WBC 9.4 01/15/2023   HGB 13.1 01/15/2023   HCT 40.9 01/15/2023   MCV 76.2 (L) 01/15/2023   PLT 376 01/15/2023   NEUTROABS 6.8 01/15/2023     RADIOGRAPHIC STUDIES: I have personally reviewed the radiological images as listed and agreed with the findings in the report. CT CHEST W CONTRAST  Result Date: 01/17/2023 CLINICAL DATA:  Prior history of lymphoproliferative disorder. Prior surgery. * Tracking Code: BO * EXAM: CT CHEST WITH CONTRAST TECHNIQUE: Multidetector CT imaging of the chest was performed during intravenous contrast administration. RADIATION DOSE REDUCTION: This exam was performed according to the departmental dose-optimization program which includes automated exposure control, adjustment of the mA and/or kV according to patient size and/or use of iterative reconstruction technique. CONTRAST:  85mL OMNIPAQUE IOHEXOL 300 MG/ML  SOLN COMPARISON:  CT scan 08/24/2022 FINDINGS: Cardiovascular: Heart is nonenlarged. Small pericardial effusion similar to previous. Grossly normal caliber thoracic aorta. Mediastinum/Nodes: Normal thyroid gland. Normal caliber thoracic esophagus. The previous lymph node in the right axillary region is no longer identified. There is presence of clips and some soft tissue thickening in this location consistent with the provided history of recent surgery. No new lymph node enlargement identified in the axillary regions. Only a few small nodes seen which are nonpathologic by size criteria. No mediastinal or hilar nodal enlargement. Few prominent chest wall nodes were seen on the prior on the right side. The previous subpectoral node on the prior which measured 12 mm in short axis, today measures 8 mm. Other nodes in this location are slightly smaller as well. Lungs/Pleura: Breathing motion seen. Persistent bandlike opacity left lung base.  Atelectasis or scar. No consolidation, pneumothorax or effusion. Upper Abdomen: Adrenal glands are preserved in the upper abdomen. Fatty liver infiltration. Musculoskeletal: Scattered degenerative changes along the spine. Scattered osteophyte formation both anterior and posteriorly. Subchondral cyst formation suggested along both glenohumeral joints as well at the edge of the imaging field. IMPRESSION: Interval surgical changes in the right  axillary region with removal of the previous node. No new lymph node enlargement identified. There are some nodes on the prior examination in the right axillary region and chest wall which are decreasing in size today. Please correlate with clinical findings. Persistent pericardial effusion. Electronically Signed   By: Karen Kays M.D.   On: 01/17/2023 14:17    I discussed the assessment and treatment plan with the patient. The patient was provided an opportunity to ask questions and all were answered. The patient agreed with the plan and demonstrated an understanding of the instructions. The patient was advised to call back or seek an in-person evaluation if the symptoms worsen or if the condition fails to improve as anticipated.    I spent 25 minutes for the appointment reviewing test results, discuss management and coordination of care.  Artis Delay, MD 01/18/2023 12:44 PM

## 2023-01-18 NOTE — Assessment & Plan Note (Signed)
She had previous abnormal mammogram.  MRI showed no breast lesions. I recommend the patient to continue on diagnostic mammogram every 6 months, overdue now She will call mammogram center to get that scheduled

## 2023-06-06 ENCOUNTER — Telehealth: Payer: Self-pay

## 2023-06-06 NOTE — Telephone Encounter (Signed)
She called and left a message. She recently saw PCP and was told her may need iron infusion. She was told to call Dr. Bertis Ruddy for appt for labs.

## 2023-06-06 NOTE — Telephone Encounter (Signed)
Called and left a message asking her to call the office back to offer her appt.

## 2023-06-06 NOTE — Telephone Encounter (Signed)
She is not anemic I can see her tomorrow to review results w her and discuss why I do not think she needs IV iron

## 2023-06-07 ENCOUNTER — Telehealth: Payer: Self-pay

## 2023-06-07 NOTE — Telephone Encounter (Signed)
Returned her call and scheduled appt with Dr. Bertis Ruddy to discuss recent labs at PCP. She is aware of appt date/time on 3/3.

## 2023-06-14 ENCOUNTER — Encounter: Payer: Self-pay | Admitting: Hematology and Oncology

## 2023-06-17 ENCOUNTER — Encounter: Payer: Self-pay | Admitting: Hematology and Oncology

## 2023-06-17 ENCOUNTER — Inpatient Hospital Stay: Payer: 59 | Admitting: Hematology and Oncology

## 2023-06-17 ENCOUNTER — Other Ambulatory Visit: Payer: Self-pay | Admitting: Hematology and Oncology

## 2023-06-17 ENCOUNTER — Inpatient Hospital Stay: Attending: Hematology and Oncology | Admitting: Hematology and Oncology

## 2023-06-17 DIAGNOSIS — E611 Iron deficiency: Secondary | ICD-10-CM | POA: Diagnosis not present

## 2023-06-17 DIAGNOSIS — R590 Localized enlarged lymph nodes: Secondary | ICD-10-CM

## 2023-06-17 DIAGNOSIS — D509 Iron deficiency anemia, unspecified: Secondary | ICD-10-CM

## 2023-06-17 NOTE — Progress Notes (Signed)
 HEMATOLOGY-ONCOLOGY ELECTRONIC VISIT PROGRESS NOTE  Patient Care Team: [facility], [facility] as PCP - General (Family Medicine) Kirkland Hun, MD as Consulting Physician (Obstetrics and Gynecology) Lorelee Cover, MD as Referring Physician (Student)  I connected with the patient via telephone conference and verified that I am speaking with the correct person using two identifiers. The patient's location is at home and I am providing care from the [facility] I discussed the limitations, risks, security and privacy concerns of performing an evaluation and management service by e-visits and the availability of in person appointments.  I also discussed with the patient that there may be a patient responsible charge related to this service. The patient expressed understanding and agreed to proceed.   ASSESSMENT & PLAN:  Axillary lymphadenopathy Previous biopsy was nonmalignant I plan to repeat CT imaging next month  Iron deficiency She had remote history of iron deficiency anemia, last intravenous iron infusion in 2019 The patient also has thalassemia Her recent labs show no evidence of anemia but borderline iron deficient For now, I recommend conservative approach with over-the-counter supplement and increase intake of meat in her diet I do not recommend intravenous iron infusion in the absence of anemia Her symptoms of shortness of breath is not related to her CBC result  No orders of the defined types were placed in this encounter.   INTERVAL HISTORY: Please see below for problem oriented charting. The purpose of today's discussion is reviewed recent blood count results that was ordered by her primary care doctor On review of her electronic records, it appears that the patient have normal hemoglobin but borderline iron deficient She complained of shortness of breath The patient denies any recent signs or symptoms of bleeding such as spontaneous epistaxis,  hematuria or hematochezia.  SUMMARY OF HEMATOLOGIC HISTORY: She was previously treated by myself for iron deficiency anemia Last month, she told me she had abnormal mammogram leading to ultrasound guided biopsy I have reviewed outside records extensively with the patient and her mother We discussed mammogram and ultrasound finding as well as pathology report I also reviewed her internal records In 2014, she had palpable lymphadenopathy in the head and neck region Ultrasound only shows small lymphadenopathy.  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   PHYSICAL EXAMINATION: ECOG PERFORMANCE STATUS: 1 - Symptomatic but completely ambulatory  LABORATORY DATA:  I have reviewed the data as listed    Latest Ref Rng & Units 01/15/2023    2:14 PM 09/28/2022    1:24 PM 09/14/2022    3:00 PM  CMP  Glucose 70 - 99 mg/dL 98  89  161   BUN 6 - 20 mg/dL 9  7  13    Creatinine 0.44 - 1.00 mg/dL 0.96  0.45  4.09   Sodium 135 - 145 mmol/L 138  138  135   Potassium 3.5 - 5.1 mmol/L 3.5  3.9  3.7   Chloride 98 - 111 mmol/L 106  103  104   CO2 22 - 32 mmol/L 25  28  22    Calcium 8.9 - 10.3 mg/dL 9.7  9.3  8.6   Total Protein 6.5 - 8.1 g/dL 7.6  7.2    Total Bilirubin 0.3 - 1.2 mg/dL 0.4  0.3    Alkaline Phos 38 - 126 U/L 70  70  AST 15 - 41 U/L 13  12    ALT 0 - 44 U/L 9  10      Lab Results  Component Value Date   WBC 9.4 01/15/2023   HGB 13.1 01/15/2023   HCT 40.9 01/15/2023   MCV 76.2 (L) 01/15/2023   PLT 376 01/15/2023   NEUTROABS 6.8 01/15/2023   I discussed the assessment and treatment plan with the patient. The patient was provided an opportunity to ask questions and all were answered. The patient agreed with the plan and demonstrated an understanding of the instructions. The patient was advised  to call back or seek an in-person evaluation if the symptoms worsen or if the condition fails to improve as anticipated.    I spent 25 minutes for the appointment reviewing test results, discuss management and coordination of care.  Artis Delay, MD 06/17/2023 10:48 AM

## 2023-06-17 NOTE — Assessment & Plan Note (Signed)
 Previous biopsy was nonmalignant I plan to repeat CT imaging next month

## 2023-06-17 NOTE — Assessment & Plan Note (Signed)
 She had remote history of iron deficiency anemia, last intravenous iron infusion in 2019 The patient also has thalassemia Her recent labs show no evidence of anemia but borderline iron deficient For now, I recommend conservative approach with over-the-counter supplement and increase intake of meat in her diet I do not recommend intravenous iron infusion in the absence of anemia Her symptoms of shortness of breath is not related to her CBC result

## 2023-07-19 ENCOUNTER — Inpatient Hospital Stay: Payer: 59 | Attending: Hematology and Oncology

## 2023-07-19 ENCOUNTER — Ambulatory Visit (HOSPITAL_COMMUNITY)
Admission: RE | Admit: 2023-07-19 | Discharge: 2023-07-19 | Disposition: A | Discharge status: Home / Self Care | Source: Ambulatory Visit | Attending: Hematology and Oncology | Admitting: Hematology and Oncology

## 2023-07-19 DIAGNOSIS — D509 Iron deficiency anemia, unspecified: Secondary | ICD-10-CM | POA: Insufficient documentation

## 2023-07-19 DIAGNOSIS — R590 Localized enlarged lymph nodes: Secondary | ICD-10-CM | POA: Insufficient documentation

## 2023-07-19 LAB — CBC WITH DIFFERENTIAL (CANCER CENTER ONLY)
Abs Immature Granulocytes: 0.02 10*3/uL (ref 0.00–0.07)
Basophils Absolute: 0 10*3/uL (ref 0.0–0.1)
Basophils Relative: 1 %
Eosinophils Absolute: 0.2 10*3/uL (ref 0.0–0.5)
Eosinophils Relative: 2 %
HCT: 40.5 % (ref 36.0–46.0)
Hemoglobin: 12.6 g/dL (ref 12.0–15.0)
Immature Granulocytes: 0 %
Lymphocytes Relative: 22 %
Lymphs Abs: 1.7 10*3/uL (ref 0.7–4.0)
MCH: 24.9 pg — ABNORMAL LOW (ref 26.0–34.0)
MCHC: 31.1 g/dL (ref 30.0–36.0)
MCV: 79.9 fL — ABNORMAL LOW (ref 80.0–100.0)
Monocytes Absolute: 0.6 10*3/uL (ref 0.1–1.0)
Monocytes Relative: 7 %
Neutro Abs: 5.3 10*3/uL (ref 1.7–7.7)
Neutrophils Relative %: 68 %
Platelet Count: 331 10*3/uL (ref 150–400)
RBC: 5.07 MIL/uL (ref 3.87–5.11)
RDW: 15.9 % — ABNORMAL HIGH (ref 11.5–15.5)
WBC Count: 7.7 10*3/uL (ref 4.0–10.5)
nRBC: 0 % (ref 0.0–0.2)

## 2023-07-19 LAB — CMP (CANCER CENTER ONLY)
ALT: 7 U/L (ref 0–44)
AST: 10 U/L — ABNORMAL LOW (ref 15–41)
Albumin: 4.2 g/dL (ref 3.5–5.0)
Alkaline Phosphatase: 67 U/L (ref 38–126)
Anion gap: 6 (ref 5–15)
BUN: 11 mg/dL (ref 6–20)
CO2: 26 mmol/L (ref 22–32)
Calcium: 9.2 mg/dL (ref 8.9–10.3)
Chloride: 107 mmol/L (ref 98–111)
Creatinine: 0.69 mg/dL (ref 0.44–1.00)
GFR, Estimated: >60 mL/min (ref >60)
Glucose, Bld: 97 mg/dL (ref 70–99)
Potassium: 3.9 mmol/L (ref 3.5–5.1)
Sodium: 139 mmol/L (ref 135–145)
Total Bilirubin: 0.5 mg/dL (ref 0.0–1.2)
Total Protein: 7.6 g/dL (ref 6.5–8.1)

## 2023-07-19 LAB — IRON AND IRON BINDING CAPACITY (CC-WL,HP ONLY)
Iron: 29 ug/dL (ref 28–170)
Saturation Ratios: 8 % — ABNORMAL LOW (ref 10.4–31.8)
TIBC: 389 ug/dL (ref 250–450)
UIBC: 360 ug/dL (ref 148–442)

## 2023-07-19 LAB — FERRITIN: Ferritin: 37 ng/mL (ref 11–307)

## 2023-07-19 LAB — LACTATE DEHYDROGENASE: LDH: 137 U/L (ref 98–192)

## 2023-07-19 MED ORDER — IOHEXOL 300 MG/ML  SOLN
75.0000 mL | Freq: Once | INTRAMUSCULAR | Status: AC | PRN
  Administered 2023-07-19: 75 mL via INTRAVENOUS

## 2023-07-24 ENCOUNTER — Telehealth: Payer: Self-pay

## 2023-07-24 NOTE — Telephone Encounter (Signed)
 Called [facility] reading room for STAT read on CT.

## 2023-07-26 ENCOUNTER — Encounter: Payer: Self-pay | Admitting: Hematology and Oncology

## 2023-07-26 ENCOUNTER — Inpatient Hospital Stay: Payer: 59 | Admitting: Hematology and Oncology

## 2023-07-26 VITALS — BP 135/78 | HR 79 | Temp 98.0°F | Resp 18 | Ht 62.0 in | Wt 229.2 lb

## 2023-07-26 DIAGNOSIS — R590 Localized enlarged lymph nodes: Secondary | ICD-10-CM

## 2023-07-26 DIAGNOSIS — D509 Iron deficiency anemia, unspecified: Secondary | ICD-10-CM | POA: Diagnosis present

## 2023-07-26 NOTE — Assessment & Plan Note (Addendum)
 I have reviewed multiple imaging studies with the patient Her most recent CT imaging showed no evidence of recurrent lymphadenopathy She is reassured She does not need long-term follow-up with me

## 2023-07-26 NOTE — Progress Notes (Signed)
 [facility] OFFICE PROGRESS NOTE  Patient Care Team: [facility], [facility] as PCP - General (Family Medicine) Kirkland Hun, MD as Consulting Physician (Obstetrics and Gynecology) Lorelee Cover, MD as Referring Physician (Student)  Assessment & Plan Axillary lymphadenopathy I have reviewed multiple imaging studies with the patient Her most recent CT imaging showed no evidence of recurrent lymphadenopathy She is reassured She does not need long-term follow-up with me Iron deficiency anemia, unspecified iron deficiency anemia type She has history of iron deficiency anemia Her iron studies are borderline low but she is not anemic She will continue her vitamin supplement  No orders of the defined types were placed in this encounter.    Artis Delay, MD  INTERVAL HISTORY: she returns for surveillance follow-up for history of anemia and abnormal lymphadenopathy She is recently married and accompanied by her husband She has lost weight through the use of weight loss medicine and felt great We reviewed blood work and imaging study results  PHYSICAL EXAMINATION: ECOG PERFORMANCE STATUS: 0 - Asymptomatic  Vitals:   07/26/23 1205  BP: 135/78  Pulse: 79  Resp: 18  Temp: 98 F (36.7 C)  SpO2: 99%   Filed Weights   07/26/23 1205  Weight: 229 lb 3.2 oz (104 kg)    Relevant data reviewed during this visit included CBC, CMP and CT imaging from April 2025 in comparison with her prior imaging from 2024

## 2023-07-26 NOTE — Assessment & Plan Note (Addendum)
 She has history of iron deficiency anemia Her iron studies are borderline low but she is not anemic She will continue her vitamin supplement

## 2024-02-13 ENCOUNTER — Ambulatory Visit

## 2024-02-14 ENCOUNTER — Ambulatory Visit

## 2024-02-14 ENCOUNTER — Ambulatory Visit
Admission: RE | Admit: 2024-02-14 | Discharge: 2024-02-14 | Disposition: A | Discharge status: Home / Self Care | Source: Ambulatory Visit

## 2024-02-14 VITALS — BP 131/85 | HR 67 | Temp 98.3°F | Resp 18 | Wt 229.3 lb

## 2024-02-14 DIAGNOSIS — M4803 Spinal stenosis, cervicothoracic region: Secondary | ICD-10-CM | POA: Diagnosis not present

## 2024-02-14 DIAGNOSIS — R070 Pain in throat: Secondary | ICD-10-CM | POA: Insufficient documentation

## 2024-02-14 DIAGNOSIS — R87629 Unspecified abnormal cytological findings in specimens from vagina: Secondary | ICD-10-CM | POA: Insufficient documentation

## 2024-02-14 DIAGNOSIS — F3160 Bipolar disorder, current episode mixed, unspecified: Secondary | ICD-10-CM | POA: Insufficient documentation

## 2024-02-14 DIAGNOSIS — M5413 Radiculopathy, cervicothoracic region: Secondary | ICD-10-CM | POA: Diagnosis not present

## 2024-02-14 DIAGNOSIS — N926 Irregular menstruation, unspecified: Secondary | ICD-10-CM | POA: Insufficient documentation

## 2024-02-14 DIAGNOSIS — M545 Low back pain, unspecified: Secondary | ICD-10-CM | POA: Insufficient documentation

## 2024-02-14 DIAGNOSIS — R87810 Cervical high risk human papillomavirus (HPV) DNA test positive: Secondary | ICD-10-CM | POA: Insufficient documentation

## 2024-02-14 DIAGNOSIS — R600 Localized edema: Secondary | ICD-10-CM | POA: Insufficient documentation

## 2024-02-14 DIAGNOSIS — D259 Leiomyoma of uterus, unspecified: Secondary | ICD-10-CM | POA: Insufficient documentation

## 2024-02-14 DIAGNOSIS — M797 Fibromyalgia: Secondary | ICD-10-CM | POA: Insufficient documentation

## 2024-02-14 MED ORDER — KETOROLAC TROMETHAMINE 30 MG/ML IJ SOLN
30.0000 mg | Freq: Once | INTRAMUSCULAR | Status: AC
  Administered 2024-02-14: 30 mg via INTRAMUSCULAR

## 2024-02-14 MED ORDER — DEXAMETHASONE SOD PHOSPHATE PF 10 MG/ML IJ SOLN
10.0000 mg | Freq: Once | INTRAMUSCULAR | Status: AC
  Administered 2024-02-14: 10 mg via INTRAMUSCULAR

## 2024-02-14 NOTE — ED Provider Notes (Signed)
 [facility]    CSN: [id number] Arrival date & time: 02/14/24  1313      History   Chief Complaint Chief Complaint  Patient presents with   Back Pain    Stenosis Exacerbated - Entered by patient    HPI Cheyenne Arias is a 48 y.o. female.   Patient with a history of spinal stenosis, presents today due to 7/10 lower back pain for the past 7 days.  Patient states that she has radiation of pain down both legs but it does not go past her knees.  Patient denies saddle anesthesia or changes in bowel or bladder habits.  The history is provided by the patient.  Back Pain   Past Medical History:  Diagnosis Date   Allergy     seasonal   Anemia    goes to [facility], [place]   Anxiety    Arthritis    spinal stenosis   Asthma    Bipolar disorder (HCC)    Cervical cancer (HCC)    Depression    DM (diabetes mellitus) (HCC)    Fatty liver disease, nonalcoholic    Fibromyalgia    Food poisoning    GERD (gastroesophageal reflux disease)    Hepatitis    type A from food poisoning   HSV infection    Hypertension    Lumbar radiculopathy    Migraines    Neuropathy    Obesity    OSA (obstructive sleep apnea)    OSA on CPAP    uses it nightly   Peripheral neuropathy    LLE - pins and needles   Shortness of breath dyspnea    with exertion   Spinal stenosis     Patient Active Problem List   Diagnosis Date Noted   Abnormal Papanicolaou smear of vagina 02/14/2024   Cervical high risk HPV (human papillomavirus) test positive 02/14/2024   Bipolar 1 disorder, mixed (HCC) 02/14/2024   Pain in throat 02/14/2024   Uterine leiomyoma 02/14/2024   Irregular periods 02/14/2024   Peripheral edema 02/14/2024   Primary fibromyalgia syndrome 02/14/2024   Low back pain 02/14/2024   Enlarged lymph nodes in armpit 07/26/2022   Positive ANA (antinuclear antibody) 07/26/2022   Abnormal mammogram 06/28/2022   Atypical squamous cells of undetermined significance (ASCUS) on  Papanicolaou smear of cervix 12/12/2021   Allergic rhinitis due to tree pollen 12/12/2021   Chest pain 08/25/2021   Diffuse esophageal spasm 08/25/2021   Obesity, Class III, BMI 40-49.9 (morbid obesity) (HCC) 08/25/2021   Seizure-like activity (HCC) 08/25/2020   Acute asthmatic bronchitis 08/01/2020   Recurrent major depressive disorder 05/05/2020   Iron  deficiency 10/14/2019   Fatty liver 10/14/2019   Mixed hyperlipidemia 10/14/2019   Neuropathy 10/14/2019   Gastroesophageal reflux disease without esophagitis 08/01/2019   Primary osteoarthritis involving multiple joints 08/01/2019   Night terrors 11/26/2017   Spondylosis without myelopathy or radiculopathy, cervical region 11/26/2017   Vitamin D deficiency 10/07/2017   Hyperlipidemia associated with type 2 diabetes mellitus (HCC) 08/30/2017   GAD (generalized anxiety disorder) 06/05/2017   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/23/2017   Reactive thrombocytosis 05/22/2017   Sacroiliac joint pain 04/10/2017   Spondylosis of lumbar region without myelopathy or radiculopathy 04/10/2017   Hematochezia    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   DDD (degenerative disc disease), lumbosacral 11/28/2016   Bulging lumbar disc 11/28/2016   Chronic migraine without aura 11/20/2016   Chronic bilateral low back pain with bilateral sciatica 11/20/2016  History of colposcopy 07/19/2016   BV (bacterial vaginosis) 10/23/2015   Pelvic pain in female 10/23/2015   NASH (nonalcoholic steatohepatitis) 08/12/2015   CAP (community acquired pneumonia) 07/25/2015   Asthma, well controlled 07/02/2015   Hypokalemia 07/02/2015   Sepsis due to pneumonia (HCC) 07/02/2015   Leukocytosis 07/02/2015   Spinal stenosis 06/02/2015   Primary insomnia 05/04/2015   Prediabetes 01/06/2015   Bipolar affective disorder, currently active (HCC) 01/04/2015   Bipolar affective disorder, currently depressed, moderate (HCC) 01/04/2015    Dependence on nocturnal oxygen therapy 01/04/2015   Migraine headache 01/04/2015   Peripheral neuropathy, idiopathic 01/04/2015   BMI 50.0-59.9, adult (HCC) 01/04/2015   Excessive and frequent menstruation 11/29/2014   Urinary incontinence 04/21/2014   Lymphadenopathy of head and neck 12/07/2013   Anemia 04/24/2012   Pneumonia 03/27/2012   Hypertension associated with diabetes (HCC) 09/19/2011   Obstructive sleep apnea syndrome 09/19/2011   Mild persistent asthma without complication 09/18/2011   History of endometrial ablation 07/26/2011   Benign essential hypertension 07/12/2011   Spondylosis without myelopathy or radiculopathy, lumbar region 04/24/2011   Fibromyalgia 07/26/2009   Chronic fatigue syndrome with fibromyalgia 07/26/2009    Past Surgical History:  Procedure Laterality Date   AXILLARY LYMPH NODE BIOPSY Right 09/21/2022   Procedure: RIGHT AXILLARY LYMPH NODE EXCISIONAL BIOPSY;  Surgeon: Curvin Deward MOULD, MD;  Location: [facility];  Service: General;  Laterality: Right;   CARPAL TUNNEL RELEASE Right    CESAREAN SECTION  1994, 1996, 2013   x 3   CESAREAN SECTION  11/28/2011   Procedure: CESAREAN SECTION;  Surgeon: Lynwood KANDICE Solomons, MD;  Location: [facility];  Service: Obstetrics;  Laterality: N/A;   COLONOSCOPY N/A 04/01/2017   Procedure: COLONOSCOPY;  Surgeon: Aneita Gwendlyn DASEN, MD;  Location: [facility];  Service: Endoscopy;  Laterality: N/A;   COLPOSCOPY     DILITATION & CURRETTAGE/HYSTROSCOPY WITH NOVASURE ABLATION N/A 02/13/2017   Procedure: DILATATION & CURETTAGE/HYSTEROSCOPY WITH NOVASURE ABLATION;  Surgeon: Manda Fess, MD;  Location: [facility];  Service: Gynecology;  Laterality: N/A;   FACET JOINT INJECTION     multiple injections   HYSTEROSCOPY WITH D & C N/A 08/17/2015   Procedure: DILATATION AND CURETTAGE /HYSTEROSCOPY;  Surgeon: Fess Manda, MD;  Location: [facility];  Service: Gynecology;  Laterality: N/A;   MANDIBLE SURGERY  2008   WISDOM TOOTH  EXTRACTION      OB History     Gravida  3   Para  3   Term  3   Preterm      AB      Living  3      SAB      IAB      Ectopic      Multiple      Live Births  3            Home Medications    Prior to Admission medications   Medication Sig Start Date End Date Taking? Authorizing Provider  Blood Glucose Monitoring Suppl (ACCU-CHEK GUIDE ME) w/Device KIT See admin instructions. 11/16/22  Yes [provider]  cephALEXin  (KEFLEX ) 500 MG capsule Take 1 capsule 4 times a day by oral route for 7 days. 07/18/23  Yes [provider]  cholecalciferol (VITAMIN D3) 25 MCG (1000 UNIT) tablet Take 1,000 Units by mouth. 05/29/23 05/28/24 Yes [provider]  fluconazole  (DIFLUCAN ) 150 MG tablet Take 1 pill at the first sign of a yeast infection, may repeat in 48 hours if needed  12/04/21  Yes [provider]  FORA Lancets MISC 1 each by Other route. 08/21/19  Yes [provider]  levocetirizine (XYZAL) 5 MG tablet Take 5 mg by mouth. 06/28/23 06/27/24 Yes [provider]  lisinopril  (ZESTRIL ) 40 MG tablet Take 1 tablet by mouth daily. 12/14/21  Yes [provider]  polyethylene glycol powder (GLYCOLAX/MIRALAX) 17 GM/SCOOP powder Take 17 g by mouth. 06/12/23  Yes [provider]  zaleplon (SONATA) 5 MG capsule Take one capsule upon arrival to sleep center. May repeat X1 if needed 08/13/22  Yes [provider]  Acetaminophen  500 MG capsule Take 500-1,000 mg by mouth every 6 (six) hours as needed for pain.    [provider]  albuterol  (PROAIR  HFA) 108 (90 Base) MCG/ACT inhaler Inhale 2 puffs into the lungs every 6 (six) hours as needed for wheezing or shortness of breath. 07/22/18   Sood, Vineet, MD  amLODipine  (NORVASC ) 10 MG tablet Take 1 tablet by mouth daily.    [provider]  ARIPiprazole  (ABILIFY ) 2 MG tablet Take 1 tablet by mouth daily.    [provider]  ARIPiprazole  (ABILIFY ) 5 MG  tablet     [provider]  atorvastatin (LIPITOR) 10 MG tablet Take 10 mg by mouth daily.    [provider]  azithromycin  (ZITHROMAX ) 250 MG tablet TAKE 2 TABLETS BY MOUTH TODAY, THEN TAKE 1 TABLET DAILY FOR 4 DAYS    [provider]  budesonide  (PULMICORT ) 0.5 MG/2ML nebulizer solution TAKE 2 MLS BY NEBULIZATION 2 TIMES DAILY. DX CODE: CHRONIC ASTHMA J45.909 Patient taking differently: Take 0.5 mg by nebulization 2 (two) times daily. DX CODE: CHRONIC ASTHMA J45.909 08/01/20   Hope Almarie ORN, NP  busPIRone (BUSPAR) 10 MG tablet     [provider]  celecoxib  (CELEBREX ) 100 MG capsule     [provider]  ciprofloxacin (CIPRO) 500 MG tablet Take 1 tablet every 12 hours by oral route for 3 days.    [provider]  cyanocobalamin  (VITAMIN B12) 1000 MCG/ML injection INJECT 1ML INTRAMUSCULARLY EVERY WEEK FOR 4 WEEKS, THEN MONTHLY; Duration: 28    [provider]  cyclobenzaprine  (FLEXERIL ) 10 MG tablet Take 1 tablet by mouth 3 (three) times daily as needed.    [provider]  doxycycline  (VIBRAMYCIN ) 100 MG capsule     [provider]  Ferumoxytol  (FERAHEME  IV)     [provider]  fluconazole  (DIFLUCAN ) 200 MG tablet     [provider]  fluocinonide cream (LIDEX) 0.05 % APPLY TO AFFECTED AREA TWICE A DAY    [provider]  FLUoxetine  (PROZAC ) 40 MG capsule     [provider]  fluticasone  (FLONASE ) 50 MCG/ACT nasal spray PLACE 2 SPRAYS INTO BOTH NOSTRILS 2 (TWO) TIMES DAILY. Patient taking differently: Place 2 sprays into both nostrils 2 (two) times daily. 04/22/17   Sood, Vineet, MD  furosemide  (LASIX ) 80 MG tablet TAKE 1 TO 2 TABLETS DAILY AS NEEDED FOR FLUID    [provider]  hydrochlorothiazide (HYDRODIURIL) 25 MG tablet Take 25 mg by mouth daily.    [provider]  hydrOXYzine (VISTARIL) 25 MG capsule TAKE ONE CAPSULE BY MOUTH 4 TIMES A DAY AS NEEDED     [provider]  ibuprofen  (ADVIL ) 800 MG tablet Take 800 mg by mouth 3 (three) times daily.    [provider]  ipratropium-albuterol  (DUONEB) 0.5-2.5 (3) MG/3ML SOLN TAKE 3 MLS BY NEBULIZATION EVERY 4 HOURS AS NEEDED (  FOR WHEEZING/SHORTNESS OF BREATH). Patient taking differently: Take 3 mLs by nebulization every 4 (four) hours as needed (for wheezing or shortness of breath). 08/01/20   Hope Almarie ORN, NP  levofloxacin  (LEVAQUIN ) 500 MG tablet Take 1 tablet by mouth daily.    [provider]  levofloxacin  (LEVAQUIN ) 750 MG tablet Take 1 tablet by mouth daily.    [provider]  loratadine  (CLARITIN ) 10 MG tablet TAKE 10 MG BY MOUTH at night Patient taking differently: Take 10 mg by mouth daily. 08/01/20   Hope Almarie ORN, NP  LORazepam  (ATIVAN ) 1 MG tablet TAKE 1 TABLET 3 TIMES A DAY AS NEEDED FOR ANXIETY    [provider]  meloxicam  (MOBIC ) 15 MG tablet     [provider]  metFORMIN (GLUCOPHAGE) 500 MG tablet     [provider]  metoprolol  succinate (TOPROL -XL) 25 MG 24 hr tablet Take 25 mg by mouth daily.    [provider]  metoprolol  tartrate (LOPRESSOR ) 25 MG tablet Take 25 mg by mouth 1 day or 1 dose.    [provider]  metroNIDAZOLE  (FLAGYL ) 500 MG tablet Take one tablet by oral route twice a day for 7 days    [provider]  montelukast  (SINGULAIR ) 10 MG tablet TAKE 10 MG BY MOUTH AT BEDTIME Patient taking differently: Take 10 mg by mouth at bedtime. 01/08/18   Shellia Oh, MD  Multiple Vitamins-Minerals (CENTRUM WOMEN) TABS Take 1 tablet by mouth daily with breakfast.    [provider]  nitrofurantoin , macrocrystal-monohydrate, (MACROBID ) 100 MG capsule Take 1 capsule twice a day by oral route as directed for 7 days.    [provider]  nystatin  powder Apply 1 application. topically 3 (three) times daily as needed (to affected areas).    [provider]   nystatin -triamcinolone  (MYCOLOG II) cream APPLY TO THE AFFECTED AREA(S) TWICE DAILY IN THE MORNING AND EVENING    [provider]  ondansetron  (ZOFRAN ) 4 MG tablet     [provider]  ondansetron  (ZOFRAN -ODT) 8 MG disintegrating tablet Take 8 mg by mouth every 8 (eight) hours as needed for nausea or vomiting (dissolve orally).    [provider]  oxyCODONE  (ROXICODONE ) 15 MG immediate release tablet Take 15 mg by mouth 4 (four) times daily.    [provider]  oxyCODONE -acetaminophen  (PERCOCET/ROXICET) 5-325 MG tablet TAKE 1 TABLET 4 TIMES DAILY    [provider]  pantoprazole  (PROTONIX ) 40 MG tablet Take 1 tablet (40 mg total) by mouth 2 (two) times daily. Take 40 mg by mouth at night 08/26/21 09/13/22  Vernon Ranks, MD  permethrin  (ELIMITE ) 5 % cream MASSAGE INTO SKIN AND WASH OFF IN 8 TO 14 HOURS    [provider]  potassium chloride  SA (KLOR-CON  M) 20 MEQ tablet Take 1 tablet by mouth daily.    [provider]  predniSONE  (DELTASONE ) 20 MG tablet Take 1 tablet by mouth daily.    [provider]  predniSONE  (DELTASONE ) 5 MG tablet See admin instructions.    [provider]  pregabalin  (LYRICA ) 300 MG capsule Take 300 mg by mouth in the morning and at bedtime.    [provider]  PRESCRIPTION MEDICATION CPAP- Use during any time of rest    [provider]  promethazine  (PHENERGAN ) 25 MG tablet     [provider]  rizatriptan  (MAXALT ) 10 MG tablet Take 10 mg by mouth See admin instructions. Take 10 mg by mouth at onset  of migraine and may repeat once in 2 hours, if no relief    [provider]  rosuvastatin (CRESTOR) 10 MG tablet     [provider]  Suvorexant (BELSOMRA) 20 MG TABS     [provider]  tirzepatide (MOUNJARO) 7.5 MG/0.5ML Pen Inject 7.5 mg into the skin once a week.    [provider]  tiZANidine (ZANAFLEX) 4 MG tablet Take 4 mg by  mouth 3 (three) times daily.    [provider]  topiramate  (TOPAMAX ) 100 MG tablet Take 1 tablet by mouth 2 (two) times daily.    [provider]  triamcinolone  cream (KENALOG) 0.1 % Apply 1 application. topically daily as needed (to affected sites- for itching or rashes). 07/23/16   [provider]  triamterene-hydrochlorothiazide (DYAZIDE) 37.5-25 MG capsule Take 1 capsule by mouth daily as needed.    [provider]  valACYclovir  (VALTREX ) 500 MG tablet Take 500 mg by mouth daily.    [provider]  zolpidem  (AMBIEN ) 5 MG tablet Take 5 mg by mouth at bedtime as needed for sleep.   05/10/14  [provider]    Family History Family History  Problem Relation Age of Onset   Hypertension Mother    Kidney disease Father        transplant required   Cancer Maternal Aunt 46       breast ca   Sickle cell trait Maternal Aunt    Depression Daughter    Schizophrenia Son    Sickle cell anemia Other    Stomach cancer Neg Hx    Colon cancer Neg Hx     Social History Social History   Tobacco Use   Smoking status: Never    Passive exposure: Never   Smokeless tobacco: Never   Tobacco comments:    never  Vaping Use   Vaping status: Never Used  Substance Use Topics   Alcohol use: No   Drug use: No     Allergies   Bee venom, Duloxetine hcl, Latex, Shellfish allergy , Shellfish protein-containing drug products, Tree extract, Wound dressing adhesive, Celecoxib , Tape, Morphine , Morphine  and codeine, and Vitex   Review of Systems Review of Systems  Musculoskeletal:  Positive for back pain.     Physical Exam Triage Vital Signs ED Triage Vitals  Encounter Vitals Group     BP 02/14/24 1336 131/85     Girls Systolic BP Percentile --      Girls Diastolic BP Percentile --      Boys Systolic BP Percentile --      Boys Diastolic BP Percentile --      Pulse Rate 02/14/24 1336 67     Resp 02/14/24 1336 18     Temp 02/14/24 1336 98.3  F (36.8 C)     Temp Source 02/14/24 1336 Oral     SpO2 02/14/24 1336 98 %     Weight 02/14/24 1335 229 lb 4.5 oz (104 kg)     Height --      Head Circumference --      Peak Flow --      Pain Score 02/14/24 1333 7     Pain Loc --      Pain Education --      Exclude from Growth Chart --    No data found.  Updated Vital Signs BP 131/85 (BP Location: Left Arm)   Pulse 67   Temp 98.3 F (36.8 C) (Oral)   Resp 18  Wt 229 lb 4.5 oz (104 kg)   LMP  (LMP Unknown)   SpO2 98%   BMI 41.94 kg/m   Visual Acuity Right Eye Distance:   Left Eye Distance:   Bilateral Distance:    Right Eye Near:   Left Eye Near:    Bilateral Near:     Physical Exam Vitals and nursing note reviewed.  Constitutional:      General: She is not in acute distress.    Appearance: Normal appearance. She is not ill-appearing, toxic-appearing or diaphoretic.  Eyes:     General: No scleral icterus. Cardiovascular:     Rate and Rhythm: Normal rate and regular rhythm.     Heart sounds: Normal heart sounds.  Pulmonary:     Effort: Pulmonary effort is normal. No respiratory distress.     Breath sounds: Normal breath sounds. No wheezing or rhonchi.  Musculoskeletal:       Back:     Comments: Tenderness to palpation of lumbar spine.  Pain elicited with active range of motion lumbar spine.  Skin:    General: Skin is warm.  Neurological:     Mental Status: She is alert and oriented to person, place, and time.  Psychiatric:        Mood and Affect: Mood normal.        Behavior: Behavior normal.      UC Treatments / Results  Labs (all labs ordered are listed, but only abnormal results are displayed) Labs Reviewed - No data to display  EKG   Radiology No results found.  Procedures Procedures (including critical care time)  Medications Ordered in UC Medications  ketorolac  (TORADOL ) 30 MG/ML injection 30 mg (has no administration in time range)  dexamethasone  (DECADRON ) injection 10 mg (has no  administration in time range)    Initial Impression / Assessment and Plan / UC Course  I have reviewed the triage vital signs and the nursing notes.  Pertinent labs & imaging results that were available during my care of the patient were reviewed by me and considered in my medical decision making (see chart for details).     Patient experienced relief with in office injections. 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   Andra Corean BROCKS, PA-C 02/14/24 1427

## 2024-02-14 NOTE — ED Triage Notes (Signed)
 Stenosis Exacerbated - Entered by patient   Pt presents c/o Stenosis Exacerbated x 7 days. Pt states,  I have spinal stenosis and I need a shot. 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 Now my back and neck is irritated and hurting.  Pt denies injury and/or falls.

## 2024-02-17 ENCOUNTER — Ambulatory Visit

## 2024-05-22 ENCOUNTER — Encounter: Payer: Self-pay | Admitting: Emergency Medicine

## 2024-05-22 ENCOUNTER — Ambulatory Visit
Admission: EM | Admit: 2024-05-22 | Discharge: 2024-05-22 | Disposition: A | Discharge status: Home / Self Care | Source: Home / Self Care

## 2024-05-22 DIAGNOSIS — N898 Other specified noninflammatory disorders of vagina: Secondary | ICD-10-CM | POA: Insufficient documentation

## 2024-05-22 DIAGNOSIS — J019 Acute sinusitis, unspecified: Secondary | ICD-10-CM

## 2024-05-22 DIAGNOSIS — B372 Candidiasis of skin and nail: Secondary | ICD-10-CM

## 2024-05-22 DIAGNOSIS — L253 Unspecified contact dermatitis due to other chemical products: Secondary | ICD-10-CM

## 2024-05-22 MED ORDER — TRIAMCINOLONE ACETONIDE 0.5 % EX OINT: 1.0000 | TOPICAL_OINTMENT | Freq: Two times a day (BID) | CUTANEOUS | 0 refills | Status: AC

## 2024-05-22 MED ORDER — CLOTRIMAZOLE-BETAMETHASONE 1-0.05 % EX CREA: TOPICAL_CREAM | CUTANEOUS | 0 refills | Status: AC

## 2024-05-22 MED ORDER — AMOXICILLIN-POT CLAVULANATE 875-125 MG PO TABS: 1.0000 | ORAL_TABLET | Freq: Two times a day (BID) | ORAL | 0 refills | Status: AC

## 2024-05-22 NOTE — ED Provider Notes (Signed)
 " [facility]    CSN: [id number] Arrival date & time: 05/22/24  0920      History   Chief Complaint No chief complaint on file.   HPI Cheyenne Arias is a 49 y.o. female.   Patient presents today due to 5 days worth of sinus pressure, purulent drainage from the nose, and headache for the past 5 days.  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  Patient states that she has had a fever (highest temp 102).  Patient states that she used ibuprofen  for symptoms but denies anything else for symptoms.  Patient states that she is also experiencing significant itching of the pannus after chemical hair remover and experiencing a lot of friction from clothing.  Patient denies use of anything on the area.  Patient states that she also believes that she is having an allergic reaction to latex gloves at work.  Patient states she is experiencing a hyperpigmented eruption that is itching on backs of hands bilaterally.  The history is provided by the patient.    Past Medical History:  Diagnosis Date   Allergy     seasonal   Anemia    goes to [facility], [facility]   Anxiety    Arthritis    spinal stenosis   Asthma    Bipolar disorder (HCC)    Cervical cancer (HCC)    Depression    DM (diabetes mellitus) (HCC)    Fatty liver disease, nonalcoholic    Fibromyalgia    Food poisoning    GERD (gastroesophageal reflux disease)    Hepatitis    type A from food poisoning   HSV infection    Hypertension    Lumbar radiculopathy    Migraines    Neuropathy    Obesity    OSA (obstructive sleep apnea)    OSA on CPAP    uses it nightly   Peripheral neuropathy    LLE - pins and needles   Shortness of breath dyspnea    with exertion   Spinal stenosis     Patient Active Problem List   Diagnosis Date Noted   Vaginal discharge 05/22/2024   Abnormal Papanicolaou smear of vagina 02/14/2024   Cervical high risk HPV (human papillomavirus) test positive 02/14/2024    Bipolar 1 disorder, mixed (HCC) 02/14/2024   Pain in throat 02/14/2024   Uterine leiomyoma 02/14/2024   Irregular periods 02/14/2024   Peripheral edema 02/14/2024   Primary fibromyalgia syndrome 02/14/2024   Low back pain 02/14/2024   Enlarged lymph nodes in armpit 07/26/2022   Positive ANA (antinuclear antibody) 07/26/2022   Abnormal mammogram 06/28/2022   Atypical squamous cells of undetermined significance (ASCUS) on Papanicolaou smear of cervix 12/12/2021   Allergic rhinitis due to tree pollen 12/12/2021   Chest pain 08/25/2021   Diffuse esophageal spasm 08/25/2021   Seizure-like activity (HCC) 08/25/2020   Acute asthmatic bronchitis 08/01/2020   Recurrent major depressive disorder 05/05/2020   Iron  deficiency 10/14/2019   Fatty liver 10/14/2019   Mixed hyperlipidemia 10/14/2019   Neuropathy 10/14/2019   Gastroesophageal reflux disease without esophagitis 08/01/2019   Primary osteoarthritis involving multiple joints 08/01/2019   Night terrors 11/26/2017   Spondylosis without myelopathy or radiculopathy, cervical region 11/26/2017   Obesity due to excess calories with serious comorbidity 10/08/2017   Vitamin D deficiency 10/07/2017   Hyperlipidemia associated with type 2 diabetes mellitus (HCC) 08/30/2017   GAD (generalized anxiety disorder) 06/05/2017   Type 2 diabetes mellitus  with microalbuminuria (HCC) 05/23/2017   Reactive thrombocytosis 05/22/2017   Sacroiliac joint pain 04/10/2017   Spondylosis of lumbar region without myelopathy or radiculopathy 04/10/2017   Hematochezia    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   DDD (degenerative disc disease), lumbosacral 11/28/2016   Bulging lumbar disc 11/28/2016   Chronic migraine without aura 11/20/2016   Chronic bilateral low back pain with bilateral sciatica 11/20/2016   History of colposcopy 07/19/2016   BV (bacterial vaginosis) 10/23/2015   Pelvic pain in female 10/23/2015   NASH (nonalcoholic  steatohepatitis) 08/12/2015   CAP (community acquired pneumonia) 07/25/2015   Asthma, well controlled 07/02/2015   Hypokalemia 07/02/2015   Sepsis due to pneumonia (HCC) 07/02/2015   Leukocytosis 07/02/2015   Spinal stenosis 06/02/2015   Primary insomnia 05/04/2015   Prediabetes 01/06/2015   Bipolar affective disorder, currently active (HCC) 01/04/2015   Bipolar affective disorder, currently depressed, moderate (HCC) 01/04/2015   Dependence on nocturnal oxygen therapy 01/04/2015   Migraine headache 01/04/2015   Peripheral neuropathy, idiopathic 01/04/2015   BMI 50.0-59.9, adult (HCC) 01/04/2015   Excessive and frequent menstruation 11/29/2014   Urinary incontinence 04/21/2014   Lymphadenopathy of head and neck 12/07/2013   Anemia 04/24/2012   Pneumonia 03/27/2012   Hypertension associated with diabetes (HCC) 09/19/2011   Obstructive sleep apnea syndrome 09/19/2011   Mild persistent asthma without complication 09/18/2011   Asthma, persistent controlled 09/18/2011   History of endometrial ablation 07/26/2011   Benign essential hypertension 07/12/2011   Spondylosis without myelopathy or radiculopathy, lumbar region 04/24/2011   Fibromyalgia 07/26/2009   Chronic fatigue syndrome with fibromyalgia 07/26/2009    Past Surgical History:  Procedure Laterality Date   AXILLARY LYMPH NODE BIOPSY Right 09/21/2022   Procedure: RIGHT AXILLARY LYMPH NODE EXCISIONAL BIOPSY;  Surgeon: Curvin Deward MOULD, MD;  Location: [facility];  Service: General;  Laterality: Right;   CARPAL TUNNEL RELEASE Right    CESAREAN SECTION  1994, 1996, 2013   x 3   CESAREAN SECTION  11/28/2011   Procedure: CESAREAN SECTION;  Surgeon: Lynwood KANDICE Solomons, MD;  Location: [facility];  Service: Obstetrics;  Laterality: N/A;   COLONOSCOPY N/A 04/01/2017   Procedure: COLONOSCOPY;  Surgeon: Aneita Gwendlyn DASEN, MD;  Location: [facility];  Service: Endoscopy;  Laterality: N/A;   COLPOSCOPY     DILITATION &  CURRETTAGE/HYSTROSCOPY WITH NOVASURE ABLATION N/A 02/13/2017   Procedure: DILATATION & CURETTAGE/HYSTEROSCOPY WITH NOVASURE ABLATION;  Surgeon: Manda Fess, MD;  Location: [facility];  Service: Gynecology;  Laterality: N/A;   FACET JOINT INJECTION     multiple injections   HYSTEROSCOPY WITH D & C N/A 08/17/2015   Procedure: DILATATION AND CURETTAGE /HYSTEROSCOPY;  Surgeon: Fess Manda, MD;  Location: [facility];  Service: Gynecology;  Laterality: N/A;   MANDIBLE SURGERY  2008   WISDOM TOOTH EXTRACTION      OB History     Gravida  3   Para  3   Term  3   Preterm      AB      Living  3      SAB      IAB      Ectopic      Multiple      Live Births  3            Home Medications    Prior to Admission medications  Medication Sig Start Date End Date Taking? Authorizing Provider  amoxicillin -clavulanate (AUGMENTIN ) 875-125 MG tablet Take 1 tablet by  mouth every 12 (twelve) hours for 10 days. 05/22/24 06/01/24 Yes Andra Krabbe C, PA-C  clotrimazole -betamethasone  (LOTRISONE ) cream Apply to affected area 2 times daily prn 05/22/24  Yes Andra Krabbe BROCKS, PA-C  metoprolol  succinate (TOPROL -XL) 25 MG 24 hr tablet Take 25 mg by mouth daily.   Yes [provider]  triamcinolone  ointment (KENALOG ) 0.5 % Apply 1 Application topically 2 (two) times daily. 05/22/24  Yes Andra Krabbe BROCKS, PA-C  Acetaminophen  500 MG capsule Take 500-1,000 mg by mouth every 6 (six) hours as needed for pain.    [provider]  albuterol  (PROAIR  HFA) 108 (90 Base) MCG/ACT inhaler Inhale 2 puffs into the lungs every 6 (six) hours as needed for wheezing or shortness of breath. 07/22/18   Sood, Vineet, MD  amLODipine  (NORVASC ) 10 MG tablet Take 1 tablet by mouth daily.    [provider]  ARIPiprazole  (ABILIFY ) 2 MG tablet Take 1 tablet by mouth daily.    [provider]  ARIPiprazole  (ABILIFY ) 5 MG tablet     [provider]  atorvastatin (LIPITOR) 10  MG tablet Take 10 mg by mouth daily.    [provider]  azithromycin  (ZITHROMAX ) 250 MG tablet TAKE 2 TABLETS BY MOUTH TODAY, THEN TAKE 1 TABLET DAILY FOR 4 DAYS    [provider]  Blood Glucose Monitoring Suppl (ACCU-CHEK GUIDE ME) w/Device KIT See admin instructions. 11/16/22   [provider]  budesonide  (PULMICORT ) 0.5 MG/2ML nebulizer solution TAKE 2 MLS BY NEBULIZATION 2 TIMES DAILY. DX CODE: CHRONIC ASTHMA J45.909 Patient taking differently: Take 0.5 mg by nebulization 2 (two) times daily. DX CODE: CHRONIC ASTHMA J45.909 08/01/20   Hope Almarie ORN, NP  busPIRone (BUSPAR) 10 MG tablet     [provider]  celecoxib  (CELEBREX ) 100 MG capsule     [provider]  cephALEXin  (KEFLEX ) 500 MG capsule Take 1 capsule 4 times a day by oral route for 7 days. 07/18/23   [provider]  cholecalciferol (VITAMIN D3) 25 MCG (1000 UNIT) tablet Take 1,000 Units by mouth. 05/29/23 05/28/24  [provider]  ciprofloxacin (CIPRO) 500 MG tablet Take 1 tablet every 12 hours by oral route for 3 days.    [provider]  cyanocobalamin  (VITAMIN B12) 1000 MCG/ML injection INJECT 1ML INTRAMUSCULARLY EVERY WEEK FOR 4 WEEKS, THEN MONTHLY; Duration: 28    [provider]  cyclobenzaprine  (FLEXERIL ) 10 MG tablet Take 1 tablet by mouth 3 (three) times daily as needed.    [provider]  doxycycline  (VIBRAMYCIN ) 100 MG capsule     [provider]  Ferumoxytol  (FERAHEME  IV)     [provider]  fluconazole  (DIFLUCAN ) 150 MG tablet Take 1 pill at the first sign of a yeast infection, may repeat in 48 hours if needed 12/04/21   [provider]  fluconazole  (DIFLUCAN ) 200 MG tablet     [provider]  fluocinonide cream (LIDEX) 0.05 % APPLY TO AFFECTED AREA TWICE A DAY    [provider]  FLUoxetine  (PROZAC ) 40 MG capsule     [provider]  fluticasone  (FLONASE ) 50 MCG/ACT nasal  spray PLACE 2 SPRAYS INTO BOTH NOSTRILS 2 (TWO) TIMES DAILY. Patient taking differently: Place 2 sprays into both nostrils 2 (two) times daily. 04/22/17   Sood, Vineet, MD  FORA Lancets MISC 1 each by Other route. 08/21/19   [provider]  furosemide  (LASIX ) 80 MG tablet TAKE 1 TO 2 TABLETS DAILY AS NEEDED FOR FLUID  [provider]  hydrochlorothiazide (HYDRODIURIL) 25 MG tablet Take 25 mg by mouth daily.    [provider]  hydrOXYzine (VISTARIL) 25 MG capsule TAKE ONE CAPSULE BY MOUTH 4 TIMES A DAY AS NEEDED    [provider]  ibuprofen  (ADVIL ) 800 MG tablet Take 800 mg by mouth 3 (three) times daily.    [provider]  ipratropium-albuterol  (DUONEB) 0.5-2.5 (3) MG/3ML SOLN TAKE 3 MLS BY NEBULIZATION EVERY 4 HOURS AS NEEDED (FOR WHEEZING/SHORTNESS OF BREATH). Patient taking differently: Take 3 mLs by nebulization every 4 (four) hours as needed (for wheezing or shortness of breath). 08/01/20   Hope Almarie ORN, NP  levocetirizine (XYZAL) 5 MG tablet Take 5 mg by mouth. 06/28/23 06/27/24  [provider]  levofloxacin  (LEVAQUIN ) 500 MG tablet Take 1 tablet by mouth daily.    [provider]  levofloxacin  (LEVAQUIN ) 750 MG tablet Take 1 tablet by mouth daily.    [provider]  lisinopril  (ZESTRIL ) 40 MG tablet Take 1 tablet by mouth daily. 12/14/21   [provider]  loratadine  (CLARITIN ) 10 MG tablet TAKE 10 MG BY MOUTH at night Patient taking differently: Take 10 mg by mouth daily. 08/01/20   Hope Almarie ORN, NP  LORazepam  (ATIVAN ) 1 MG tablet TAKE 1 TABLET 3 TIMES A DAY AS NEEDED FOR ANXIETY    [provider]  meloxicam  (MOBIC ) 15 MG tablet     [provider]  metFORMIN (GLUCOPHAGE) 500 MG tablet     [provider]  metoprolol  tartrate (LOPRESSOR ) 25 MG tablet Take 25 mg by mouth 1 day or 1 dose.    [provider]  metroNIDAZOLE  (FLAGYL ) 500 MG tablet Take one tablet by  oral route twice a day for 7 days    [provider]  montelukast  (SINGULAIR ) 10 MG tablet TAKE 10 MG BY MOUTH AT BEDTIME Patient taking differently: Take 10 mg by mouth at bedtime. 01/08/18   Shellia Oh, MD  Multiple Vitamins-Minerals (CENTRUM WOMEN) TABS Take 1 tablet by mouth daily with breakfast.    [provider]  nitrofurantoin , macrocrystal-monohydrate, (MACROBID ) 100 MG capsule Take 1 capsule twice a day by oral route as directed for 7 days.    [provider]  ondansetron  (ZOFRAN ) 4 MG tablet     [provider]  ondansetron  (ZOFRAN -ODT) 8 MG disintegrating tablet Take 8 mg by mouth every 8 (eight) hours as needed for nausea or vomiting (dissolve orally).    [provider]  oxyCODONE  (ROXICODONE ) 15 MG immediate release tablet Take 15 mg by mouth 4 (four) times daily.    [provider]  oxyCODONE -acetaminophen  (PERCOCET/ROXICET) 5-325 MG tablet TAKE 1 TABLET 4 TIMES DAILY    [provider]  pantoprazole  (PROTONIX ) 40 MG tablet Take 1 tablet (40 mg total) by mouth 2 (two) times daily. Take 40 mg by mouth at night 08/26/21 09/13/22  Vernon Ranks, MD  permethrin  (ELIMITE ) 5 % cream MASSAGE INTO SKIN AND WASH OFF IN 8 TO 14 HOURS    [provider]  polyethylene glycol powder (GLYCOLAX/MIRALAX) 17 GM/SCOOP powder Take 17 g by mouth. 06/12/23   [provider]  potassium chloride  SA (KLOR-CON  M) 20 MEQ tablet Take 1 tablet by mouth daily.    [provider]  predniSONE  (DELTASONE ) 20 MG tablet Take 1 tablet by mouth daily.    [provider]  predniSONE  (DELTASONE ) 5 MG tablet See admin instructions.    [provider]  pregabalin  (LYRICA )  300 MG capsule Take 300 mg by mouth in the morning and at bedtime.    [provider]  PRESCRIPTION MEDICATION CPAP- Use during any time of rest    [provider]  promethazine  (PHENERGAN ) 25 MG tablet     [provider]   rizatriptan  (MAXALT ) 10 MG tablet Take 10 mg by mouth See admin instructions. Take 10 mg by mouth at onset of migraine and may repeat once in 2 hours, if no relief    [provider]  rosuvastatin (CRESTOR) 10 MG tablet     [provider]  Suvorexant (BELSOMRA) 20 MG TABS     [provider]  tirzepatide (MOUNJARO) 7.5 MG/0.5ML Pen Inject 7.5 mg into the skin once a week.    [provider]  tiZANidine (ZANAFLEX) 4 MG tablet Take 4 mg by mouth 3 (three) times daily.    [provider]  topiramate  (TOPAMAX ) 100 MG tablet Take 1 tablet by mouth 2 (two) times daily.    [provider]  triamterene-hydrochlorothiazide (DYAZIDE) 37.5-25 MG capsule Take 1 capsule by mouth daily as needed.    [provider]  valACYclovir  (VALTREX ) 500 MG tablet Take 500 mg by mouth daily.    [provider]  zaleplon (SONATA) 5 MG capsule Take one capsule upon arrival to sleep center. May repeat X1 if needed 08/13/22   [provider]  zolpidem  (AMBIEN ) 5 MG tablet Take 5 mg by mouth at bedtime as needed for sleep.   05/10/14  [provider]    Family History Family History  Problem Relation Age of Onset   Hypertension Mother    Kidney disease Father        transplant required   Cancer Maternal Aunt 46       breast ca   Sickle cell trait Maternal Aunt    Depression Daughter    Schizophrenia Son    Sickle cell anemia Other    Stomach cancer Neg Hx    Colon cancer Neg Hx     Social History Social History[1]   Allergies   Bee venom, Duloxetine hcl, Latex, Shellfish allergy , Shellfish protein-containing drug products, Tree extract, Wound dressing adhesive, Celecoxib , Tape, Morphine , Morphine  and codeine, and Vitex   Review of Systems Review of Systems   Physical Exam Triage Vital Signs ED Triage Vitals  Encounter Vitals Group     BP 05/22/24 1037 (!) 142/86     Girls Systolic BP Percentile --      Girls  Diastolic BP Percentile --      Boys Systolic BP Percentile --      Boys Diastolic BP Percentile --      Pulse Rate 05/22/24 1037 67     Resp 05/22/24 1037 18     Temp 05/22/24 1037 98.2 F (36.8 C)     Temp Source 05/22/24 1037 Oral     SpO2 05/22/24 1037 97 %     Weight 05/22/24 1036 229 lb 4.5 oz (104 kg)     Height --      Head Circumference --      Peak Flow --      Pain Score 05/22/24 1035 6     Pain Loc --      Pain Education --      Exclude from Growth Chart --    No data found.  Updated Vital Signs BP (!) 142/86 (BP Location: Left Arm)   Pulse 67   Temp 98.2 F (  36.8 C) (Oral)   Resp 18   Wt 229 lb 4.5 oz (104 kg)   LMP  (LMP Unknown)   SpO2 97%   BMI 41.94 kg/m   Visual Acuity Right Eye Distance:   Left Eye Distance:   Bilateral Distance:    Right Eye Near:   Left Eye Near:    Bilateral Near:     Physical Exam Vitals and nursing note reviewed.  Constitutional:      General: She is not in acute distress.    Appearance: Normal appearance. She is not ill-appearing, toxic-appearing or diaphoretic.  HENT:     Nose: Congestion (moderately enlarged turbinates) present. No rhinorrhea.     Right Sinus: Maxillary sinus tenderness and frontal sinus tenderness present.     Left Sinus: Maxillary sinus tenderness and frontal sinus tenderness present.     Comments: No tenderness to palpation of upper lip    Mouth/Throat:     Mouth: Mucous membranes are moist.     Pharynx: Oropharynx is clear. No oropharyngeal exudate or posterior oropharyngeal erythema.  Eyes:     General: No scleral icterus. Cardiovascular:     Rate and Rhythm: Normal rate and regular rhythm.     Heart sounds: Normal heart sounds.  Pulmonary:     Effort: Pulmonary effort is normal. No respiratory distress.     Breath sounds: Normal breath sounds. No wheezing or rhonchi.  Skin:    General: Skin is warm.     Comments: Hyperpigmentation noted of dorsum of bilateral hands  Patient is also  experiencing hyperpigmentation and open wounds of abdominal pannus  Neurological:     Mental Status: She is alert and oriented to person, place, and time.  Psychiatric:        Mood and Affect: Mood normal.        Behavior: Behavior normal.      UC Treatments / Results  Labs (all labs ordered are listed, but only abnormal results are displayed) Labs Reviewed - No data to display  EKG   Radiology No results found.  Procedures Procedures (including critical care time)  Medications Ordered in UC Medications - No data to display  Initial Impression / Assessment and Plan / UC Course  I have reviewed the triage vital signs and the nursing notes.  Pertinent labs & imaging results that were available during my care of the patient were reviewed by me and considered in my medical decision making (see chart for details).     Final Clinical Impressions(s) / UC Diagnoses   Final diagnoses:  Acute sinusitis, recurrence not specified, unspecified location  Candidiasis of skin  Latex allergy , contact dermatitis     Discharge Instructions      Augmentin  has been prescribed for sinus infection Betamethasone -clotrimazole  has been prescribed for candidiasis of skin  Triamcinolone  has been prescribed for dermatitis of hands due to latex allergy   You have been diagnosed with a sinus infection today, some are caused by viruses and others are caused by bacteria.  If your symptoms have been going on for less than 7 days it is most likely that you have a viral sinus infection.  Antibiotics will not work for this and it will have to run its course.  Sinus rinses (using a Nettie pot) or saline rinses are helpful as well as pseudoephedrine, and nasal sprays along with ibuprofen  and Tylenol  for pain.  If you have had your symptoms for more than 7 days you most likely have a bacterial infection  and will be prescribed antibiotics.  Supportive measures given for viral sinus infections will also be  helpful for bacterial infections.  If you are using antibiotics you should start to feel better in 2 to 3 days but it is important that you complete antibiotics in their entirety.     ED Prescriptions     Medication Sig Dispense Auth. Provider   amoxicillin -clavulanate (AUGMENTIN ) 875-125 MG tablet Take 1 tablet by mouth every 12 (twelve) hours for 10 days. 20 tablet Raejean Swinford C, PA-C   clotrimazole -betamethasone  (LOTRISONE ) cream Apply to affected area 2 times daily prn 45 g Avery Klingbeil C, PA-C   triamcinolone  ointment (KENALOG ) 0.5 % Apply 1 Application topically 2 (two) times daily. 30 g Andra Corean BROCKS, PA-C      PDMP not reviewed this encounter.    [1]  Social History Tobacco Use   Smoking status: Never    Passive exposure: Never   Smokeless tobacco: Never   Tobacco comments:    never  Vaping Use   Vaping status: Never Used  Substance Use Topics   Alcohol use: No   Drug use: No     Andra Corean BROCKS, PA-C 05/22/24 1120  "

## 2024-05-22 NOTE — Discharge Instructions (Addendum)
 Augmentin  has been prescribed for sinus infection Betamethasone -clotrimazole  has been prescribed for candidiasis of skin  Triamcinolone  has been prescribed for dermatitis of hands due to latex allergy   You have been diagnosed with a sinus infection today, some are caused by viruses and others are caused by bacteria.  If your symptoms have been going on for less than 7 days it is most likely that you have a viral sinus infection.  Antibiotics will not work for this and it will have to run its course.  Sinus rinses (using a Nettie pot) or saline rinses are helpful as well as pseudoephedrine, and nasal sprays along with ibuprofen  and Tylenol  for pain.  If you have had your symptoms for more than 7 days you most likely have a bacterial infection and will be prescribed antibiotics.  Supportive measures given for viral sinus infections will also be helpful for bacterial infections.  If you are using antibiotics you should start to feel better in 2 to 3 days but it is important that you complete antibiotics in their entirety.

## 2024-05-22 NOTE — ED Triage Notes (Signed)
 Pt presents c/o chills, nasal congestion and fever x 5 days. Pt states,  I think I have a sinus infection. I have pressure behind my eyes and sinus congestion. I also have had the chills and a fever. I also have been sneezing a lot.  Pt denies cough, emesis, and diarrhea.
